# Patient Record
Sex: Female | Born: 1970
Health system: Southern US, Community
[De-identification: ages and names within clinical notes are randomized; demographics above are authoritative.]

## PROBLEM LIST (undated history)

## (undated) DIAGNOSIS — M722 Plantar fascial fibromatosis: Secondary | ICD-10-CM

## (undated) DIAGNOSIS — I499 Cardiac arrhythmia, unspecified: Secondary | ICD-10-CM

## (undated) DIAGNOSIS — F329 Major depressive disorder, single episode, unspecified: Secondary | ICD-10-CM

## (undated) DIAGNOSIS — Z9221 Personal history of antineoplastic chemotherapy: Secondary | ICD-10-CM

## (undated) DIAGNOSIS — G43909 Migraine, unspecified, not intractable, without status migrainosus: Secondary | ICD-10-CM

## (undated) DIAGNOSIS — M1990 Unspecified osteoarthritis, unspecified site: Secondary | ICD-10-CM

## (undated) DIAGNOSIS — N920 Excessive and frequent menstruation with regular cycle: Secondary | ICD-10-CM

## (undated) DIAGNOSIS — Z803 Family history of malignant neoplasm of breast: Secondary | ICD-10-CM

## (undated) DIAGNOSIS — Z1509 Genetic susceptibility to other malignant neoplasm: Principal | ICD-10-CM

## (undated) DIAGNOSIS — I10 Essential (primary) hypertension: Secondary | ICD-10-CM

## (undated) DIAGNOSIS — Z1501 Genetic susceptibility to malignant neoplasm of breast: Secondary | ICD-10-CM

## (undated) DIAGNOSIS — F32A Depression, unspecified: Secondary | ICD-10-CM

## (undated) DIAGNOSIS — K219 Gastro-esophageal reflux disease without esophagitis: Secondary | ICD-10-CM

## (undated) DIAGNOSIS — G893 Neoplasm related pain (acute) (chronic): Secondary | ICD-10-CM

## (undated) DIAGNOSIS — L640 Drug-induced androgenic alopecia: Secondary | ICD-10-CM

## (undated) DIAGNOSIS — G4700 Insomnia, unspecified: Secondary | ICD-10-CM

## (undated) DIAGNOSIS — D689 Coagulation defect, unspecified: Secondary | ICD-10-CM

## (undated) HISTORY — DX: Migraine, unspecified, not intractable, without status migrainosus: G43.909

## (undated) HISTORY — DX: Excessive and frequent menstruation with regular cycle: N92.0

## (undated) HISTORY — PX: TUBAL LIGATION: SHX77

## (undated) HISTORY — DX: Drug-induced androgenic alopecia: L64.0

## (undated) HISTORY — DX: Depression, unspecified: F32.A

## (undated) HISTORY — DX: Neoplasm related pain (acute) (chronic): G89.3

## (undated) HISTORY — DX: Unspecified osteoarthritis, unspecified site: M19.90

## (undated) HISTORY — DX: Gastro-esophageal reflux disease without esophagitis: K21.9

## (undated) HISTORY — DX: Genetic susceptibility to other malignant neoplasm: Z15.09

## (undated) HISTORY — PX: APPENDECTOMY: SHX54

## (undated) HISTORY — DX: Genetic susceptibility to malignant neoplasm of breast: Z15.01

## (undated) HISTORY — DX: Family history of malignant neoplasm of breast: Z80.3

## (undated) HISTORY — DX: Coagulation defect, unspecified: D68.9

## (undated) HISTORY — DX: Plantar fascial fibromatosis: M72.2

## (undated) HISTORY — DX: Insomnia, unspecified: G47.00

## (undated) HISTORY — PX: PARACENTESIS: SHX844

## (undated) HISTORY — DX: Major depressive disorder, single episode, unspecified: F32.9

---

## 1999-01-26 ENCOUNTER — Inpatient Hospital Stay (HOSPITAL_COMMUNITY): Admission: AD | Admit: 1999-01-26 | Discharge: 1999-01-26 | Payer: Self-pay

## 1999-04-08 ENCOUNTER — Ambulatory Visit (HOSPITAL_COMMUNITY): Admission: RE | Admit: 1999-04-08 | Discharge: 1999-04-08 | Payer: Self-pay | Admitting: *Deleted

## 1999-06-04 ENCOUNTER — Inpatient Hospital Stay (HOSPITAL_COMMUNITY): Admission: AD | Admit: 1999-06-04 | Discharge: 1999-06-04 | Payer: Self-pay | Admitting: *Deleted

## 1999-06-21 ENCOUNTER — Ambulatory Visit (HOSPITAL_COMMUNITY): Admission: RE | Admit: 1999-06-21 | Discharge: 1999-06-21 | Payer: Self-pay | Admitting: Obstetrics

## 1999-07-09 ENCOUNTER — Inpatient Hospital Stay (HOSPITAL_COMMUNITY): Admission: AD | Admit: 1999-07-09 | Discharge: 1999-07-09 | Payer: Self-pay | Admitting: Obstetrics & Gynecology

## 1999-08-27 ENCOUNTER — Inpatient Hospital Stay (HOSPITAL_COMMUNITY): Admission: AD | Admit: 1999-08-27 | Discharge: 1999-08-27 | Payer: Self-pay | Admitting: *Deleted

## 1999-09-03 ENCOUNTER — Inpatient Hospital Stay (HOSPITAL_COMMUNITY): Admission: AD | Admit: 1999-09-03 | Discharge: 1999-09-07 | Payer: Self-pay | Admitting: Obstetrics

## 2000-09-01 ENCOUNTER — Encounter: Admission: RE | Admit: 2000-09-01 | Discharge: 2000-09-01 | Payer: Self-pay | Admitting: Internal Medicine

## 2001-04-28 ENCOUNTER — Emergency Department (HOSPITAL_COMMUNITY): Admission: EM | Admit: 2001-04-28 | Discharge: 2001-04-28 | Payer: Self-pay | Admitting: Unknown Physician Specialty

## 2002-03-12 ENCOUNTER — Encounter: Payer: Self-pay | Admitting: Family Medicine

## 2002-03-12 ENCOUNTER — Encounter: Admission: RE | Admit: 2002-03-12 | Discharge: 2002-03-12 | Payer: Self-pay | Admitting: Family Medicine

## 2005-02-28 ENCOUNTER — Other Ambulatory Visit: Admission: RE | Admit: 2005-02-28 | Discharge: 2005-02-28 | Payer: Self-pay | Admitting: Obstetrics and Gynecology

## 2005-12-12 ENCOUNTER — Inpatient Hospital Stay (HOSPITAL_COMMUNITY): Admission: EM | Admit: 2005-12-12 | Discharge: 2005-12-15 | Payer: Self-pay | Admitting: Emergency Medicine

## 2005-12-12 ENCOUNTER — Encounter (INDEPENDENT_AMBULATORY_CARE_PROVIDER_SITE_OTHER): Payer: Self-pay | Admitting: *Deleted

## 2006-05-01 ENCOUNTER — Emergency Department (HOSPITAL_COMMUNITY): Admission: EM | Admit: 2006-05-01 | Discharge: 2006-05-02 | Payer: Self-pay | Admitting: Emergency Medicine

## 2008-02-07 ENCOUNTER — Other Ambulatory Visit: Admission: RE | Admit: 2008-02-07 | Discharge: 2008-02-07 | Payer: Self-pay | Admitting: Family Medicine

## 2008-11-03 ENCOUNTER — Emergency Department (HOSPITAL_COMMUNITY): Admission: EM | Admit: 2008-11-03 | Discharge: 2008-11-03 | Payer: Self-pay | Admitting: Emergency Medicine

## 2008-12-11 ENCOUNTER — Emergency Department (HOSPITAL_COMMUNITY): Admission: EM | Admit: 2008-12-11 | Discharge: 2008-12-11 | Payer: Self-pay | Admitting: Emergency Medicine

## 2008-12-11 ENCOUNTER — Encounter (INDEPENDENT_AMBULATORY_CARE_PROVIDER_SITE_OTHER): Payer: Self-pay | Admitting: *Deleted

## 2009-10-05 ENCOUNTER — Other Ambulatory Visit: Admission: RE | Admit: 2009-10-05 | Discharge: 2009-10-05 | Payer: Self-pay | Admitting: Family Medicine

## 2010-03-26 ENCOUNTER — Inpatient Hospital Stay (HOSPITAL_COMMUNITY): Admission: AD | Admit: 2010-03-26 | Discharge: 2010-03-27 | Payer: Self-pay | Admitting: Obstetrics & Gynecology

## 2010-08-04 ENCOUNTER — Emergency Department (HOSPITAL_COMMUNITY): Admission: EM | Admit: 2010-08-04 | Discharge: 2010-08-04 | Payer: Self-pay | Admitting: Family Medicine

## 2011-03-11 LAB — POCT URINALYSIS DIPSTICK
Bilirubin Urine: NEGATIVE
Glucose, UA: NEGATIVE mg/dL
Ketones, ur: NEGATIVE mg/dL
Protein, ur: NEGATIVE mg/dL
Specific Gravity, Urine: >=1.03 (ref 1.005–1.030)
Urobilinogen, UA: 0.2 mg/dL (ref 0.0–1.0)

## 2011-03-11 LAB — WET PREP, GENITAL: Yeast Wet Prep HPF POC: NONE SEEN

## 2011-03-11 LAB — POCT PREGNANCY, URINE: Preg Test, Ur: NEGATIVE

## 2011-03-11 LAB — URINE CULTURE

## 2011-03-11 LAB — GC/CHLAMYDIA PROBE AMP, GENITAL: GC Probe Amp, Genital: NEGATIVE

## 2011-03-16 LAB — WET PREP, GENITAL
Trich, Wet Prep: NONE SEEN
Yeast Wet Prep HPF POC: NONE SEEN

## 2011-03-16 LAB — URINALYSIS, ROUTINE W REFLEX MICROSCOPIC
Protein, ur: NEGATIVE mg/dL
Urobilinogen, UA: 0.2 mg/dL (ref 0.0–1.0)
pH: 6.5 (ref 5.0–8.0)

## 2011-03-16 LAB — GC/CHLAMYDIA PROBE AMP, GENITAL
Chlamydia, DNA Probe: NEGATIVE
GC Probe Amp, Genital: NEGATIVE

## 2011-03-16 LAB — POCT PREGNANCY, URINE: Preg Test, Ur: NEGATIVE

## 2011-03-16 LAB — URINE MICROSCOPIC-ADD ON

## 2011-05-13 NOTE — Op Note (Signed)
[name], [name]              ACCOUNT NO.:  [id number]   MEDICAL RECORD NO.:  [id number]          PATIENT TYPE:  INP   LOCATION:  5733                         FACILITY:  [facility]   PHYSICIAN:  Cherylynn Ridges, M.D.    DATE OF BIRTH:  [birth year]   DATE OF PROCEDURE:  12/13/2005  DATE OF DISCHARGE:                                 OPERATIVE REPORT   PREOPERATIVE DIAGNOSIS:  Acute appendicitis.   POSTOPERATIVE DIAGNOSIS:  Acute appendicitis with intraoperative rupture.   PROCEDURE:  Laparoscopic appendectomy.   SURGEON:  Cherylynn Ridges, M.D.   ASSISTANT:  None.   ANESTHESIA:  General endotracheal.   ESTIMATED BLOOD LOSS:  Less than 20 mL.   COMPLICATIONS:  Intraoperative rupture with localized spillage of pus in the  right lower quadrant.   CONDITION:  Stable.   INDICATIONS FOR OPERATION:  The patient is a 40 year old with abdominal  pain, localized was right lobe lower quadrant with white count of 10,000 and  left showed and a CT demonstrating acute appendicitis.   FINDINGS:  The patient had a suppurative appendix which had not ruptured  until it was manipulated during surgery at which time there was localize  rupture into the right lower quadrant. This was copiously irrigated free  using 3 liters of saline solution.   OPERATION:  The patient was taken to the operating room, placed on the table  in supine position. After an adequate endotracheal anesthetic was  administered, she was prepped and draped in usual sterile manner exposing  the midline and the right upper quadrant and left lower quadrant. A  supraumbilical curvilinear incision was made using #11 blade and taken out  to the midline fascia. It was through this midline fascia that a Veress  needle was passed into the peritoneal cavity and confirmed to be in this  position with the saline test. Once this was done, carbon dioxide  insufflation was instilled into the peritoneal cavity up to maximal intra-  abdominal  pressure of 50 mmHg. Once this was done, a 12 mm OptiVu trocar and  cannula was inserted.  Camera and light source was passed through the  supraumbilical fascia into the peritoneal cavity under indirect vision. Once  we had adequate intraperitoneal placement of the trocar and cannula, we  placed a right upper quadrant 5 mm cannula and a  suprapubic 12 mm cavity  under direct vision. We all in place, dissection was begun with the patient  in Trendelenburg and left-side was tilted down.   The appendix was partially covered at its tip with a piece of omentum which  was apparently covering a very necrotic area of appendix. As we manipulated  this bluntly in order to get adequate exposure of the appendiceal base of  the cecum, the appendix ruptured, spilling several mLs of pus in the right  lower quadrant. We continued to mobilize the mesoappendix using  electrocautery to come through it very carefully down to the base of the  appendix at the cecum. We came across the base of the appendix at the cecum  using an Endo-GIA with 3.5 mm  closure staples. This allowed [place] to remove the  appendix using an EndoCatch bag through the suprapubic cannula with minimal  difficulty. Once this was done, we inserted all cannulas and started to  irrigate the right lower quadrant and periappendiceal and pericolic area  with copious amounts of saline solution. Approximately 3 liters were used.  Once we had adequately irrigated that area and inspected for bleeding and  there was minimal to no bleeding, we removed all cannulas and aspirated all  fluid and gas.   The supraumbilical site was closed using a fascial stitch of 0 Vicryl. The  skin was closed using running subcuticular stitch of 5-0 Vicryl, 0.25  Marcaine was previously injected at all sites. All counts were correct and  sterile dressings were applied.      Cherylynn Ridges, M.D.  Electronically Signed     JOW/MEDQ  D:  12/13/2005  T:  12/14/2005   Job:  [id number]

## 2011-05-13 NOTE — H&P (Signed)
[name], KLUTH NO.:  [id number]   MEDICAL RECORD NO.:  [id number]          PATIENT TYPE:  EMS   LOCATION:  MAJO                         FACILITY:  [facility]   PHYSICIAN:  Cherylynn Ridges, M.D.    DATE OF BIRTH:  [birth year]   DATE OF ADMISSION:  12/12/2005  DATE OF DISCHARGE:                                HISTORY & PHYSICAL   IDENTIFICATION AND CHIEF COMPLAINT:  The patient is a 40 year old with acute  appendicitis diagnosed by CT scan.   HISTORY OF PRESENT ILLNESS:  The patient has been ill since yesterday  evening with vague lower abdominal pain.  She says that the pain started on  the right side and has been on the right side.  She was seen in the  emergency room this evening starting about 6 o'clock p.m. with abdominal  pain.  She was seen by the P.A. who, in spite of a UA demonstrating possible  acute urinary tract infection, felt as though her pain was out of proportion  to what one would expect with a UTI and ordered a CT scan which demonstrated  acute appendicitis.  Surgical consultation was obtained.   PAST MEDICAL HISTORY:  Unremarkable.   REVIEW OF SYSTEMS:  She has not had any nausea or vomiting. She has no  decreased appetite and ate shortly before coming in here.  She has had no  fevers or chills.  No rebound or guarding.   MEDICATIONS:  None.  She does not take anything but birth control pills.   ALLERGIES:  No known drug allergies.   PHYSICAL EXAMINATION:  GENERAL: Well-nourished, well-developed, in no acute  distress as of now. Mild tenderness in the right lower quadrant.  HEENT:  Normocephalic and atraumatic.  Not icteric.  NECK:  Supple.  CHEST:  Clear to auscultation.  HEART:  Regular rhythm and rate.  ABDOMEN: Soft, tender in the right lower quadrant with rebound, guarding,  and positive Rovsing sign.  RECTAL AND PELVIC:  Exams not performed.   IMPRESSION:  Acute appendicitis by CT, but I check the patient's UA which  does show  some evidence of bacteria but also contaminated specimen. White  count is normal, but she does have a left shift at 10.1.   PLAN:  I plan to take her to the OR for a laparoscopic appendectomy.  This  will be done as soon as possible.     Cherylynn Ridges, M.D.  Electronically Signed    JOW/MEDQ  D:  12/12/2005  T:  12/13/2005  Job:  [id number]

## 2011-05-13 NOTE — Discharge Summary (Signed)
NAMEMERISSA, RENWICK              ACCOUNT NO.:  [id number]   MEDICAL RECORD NO.:  [id number]          PATIENT TYPE:  INP   LOCATION:  5733                         FACILITY:  [facility]   PHYSICIAN:  Sharlet Salina T. Hoxworth, M.D.DATE OF BIRTH:  [birth year]   DATE OF ADMISSION:  12/12/2005  DATE OF DISCHARGE:  12/15/2005                                 DISCHARGE SUMMARY   DISCHARGE DIAGNOSIS:  Acute appendicitis with intraoperative rupture, status  post laparoscopic appendectomy December 13, 2005 by Dr. Lindie Spruce.   HISTORY OF PRESENT ILLNESS:  Ms. Hlavaty is a 40 year old female who had a  history of 24 hours of abdominal pain at the right lower quadrant. She  presented to [facility] emergency room and CT scan confirmed an acute  appendicitis. She was taken emergently to the operating room by Dr. Jimmye Norman and she was found to have acute appendicitis as well as an  intraoperative rupture. She tolerated the procedure well and was taken back  to her room in stable condition. During the next day, she had varying blood  pressures but as she gradually began to eat and ambulate, her blood pressure  normalized. On postoperative day 2, her systolic blood pressure standing was  112/72. Her white blood cell count had normalized to 7.3.   DISPOSITION:  She was ready for discharge to home.   CONDITION ON DISCHARGE:  Stable.   DISCHARGE MEDICATIONS:  1.  Ciprofloxacin 500 mg 1 p.o. b.i.d.  2.  Flagyl 500 mg p.o. t.i.d.  Both of these will remain for 1 week.  1.  Vicodin as needed for pain.   WOUND CARE:  She is to clean the area with soap and water.   ACTIVITY:  No driving for 2 days. No lifting over 10 pounds for 2 weeks.   SPECIAL INSTRUCTIONS:  She may return to work on December 25, 2005.   FOLLOW UP:  Appointment with Dr. Lindie Spruce on December 20, 2005 at 11:30 a.m.      Guy Franco, P.A.      Lorne Skeens. Hoxworth, M.D.  Electronically Signed    LB/MEDQ  D:  12/15/2005  T:  12/16/2005  Job:   [id number]   cc:   [facility]

## 2011-09-27 LAB — DIFFERENTIAL
Basophils Absolute: 0
Basophils Relative: 0
Eosinophils Absolute: 0.1
Lymphocytes Relative: 26
Monocytes Absolute: 0.5
Monocytes Relative: 6
Neutrophils Relative %: 67

## 2011-09-27 LAB — BASIC METABOLIC PANEL
BUN: 15
CO2: 23
Calcium: 9.1
Chloride: 108
GFR calc Af Amer: >60
Glucose, Bld: 104 — ABNORMAL HIGH
Potassium: 4
Sodium: 139

## 2011-09-27 LAB — CBC
HCT: 41.8
Hemoglobin: 14.2
MCHC: 34
RDW: 13

## 2011-09-27 LAB — CK TOTAL AND CKMB (NOT AT ARMC): CK, MB: 1.7

## 2011-09-30 LAB — URINALYSIS, ROUTINE W REFLEX MICROSCOPIC
Glucose, UA: NEGATIVE mg/dL
Leukocytes, UA: NEGATIVE
pH: 5.5 (ref 5.0–8.0)

## 2011-09-30 LAB — CBC
Hemoglobin: 13.8 g/dL (ref 12.0–15.0)
MCHC: 33.6 g/dL (ref 30.0–36.0)
Platelets: 170 10*3/uL (ref 150–400)
RBC: 4.44 MIL/uL (ref 3.87–5.11)
RDW: 13.3 % (ref 11.5–15.5)

## 2011-09-30 LAB — DIFFERENTIAL
Basophils Absolute: 0.1 10*3/uL (ref 0.0–0.1)
Eosinophils Relative: 1 % (ref 0–5)
Lymphocytes Relative: 24 % (ref 12–46)
Lymphs Abs: 1.9 10*3/uL (ref 0.7–4.0)
Neutro Abs: 5.5 10*3/uL (ref 1.7–7.7)
Neutrophils Relative %: 68 % (ref 43–77)

## 2011-09-30 LAB — PREGNANCY, URINE: Preg Test, Ur: NEGATIVE

## 2011-09-30 LAB — COMPREHENSIVE METABOLIC PANEL
AST: 22 U/L (ref 0–37)
Calcium: 9.2 mg/dL (ref 8.4–10.5)
Creatinine, Ser: 0.88 mg/dL (ref 0.4–1.2)
GFR calc non Af Amer: >60 mL/min (ref >60)
Total Protein: 7 g/dL (ref 6.0–8.3)

## 2011-09-30 LAB — URINE MICROSCOPIC-ADD ON

## 2011-12-14 ENCOUNTER — Other Ambulatory Visit: Payer: Self-pay | Admitting: Family Medicine

## 2011-12-14 ENCOUNTER — Other Ambulatory Visit (HOSPITAL_COMMUNITY)
Admission: RE | Admit: 2011-12-14 | Discharge: 2011-12-14 | Disposition: A | Discharge status: Home / Self Care | Payer: 59 | Source: Ambulatory Visit | Attending: Family Medicine | Admitting: Family Medicine

## 2011-12-14 DIAGNOSIS — Z1159 Encounter for screening for other viral diseases: Secondary | ICD-10-CM | POA: Insufficient documentation

## 2011-12-14 DIAGNOSIS — Z01419 Encounter for gynecological examination (general) (routine) without abnormal findings: Secondary | ICD-10-CM | POA: Insufficient documentation

## 2012-05-22 ENCOUNTER — Emergency Department (HOSPITAL_COMMUNITY)
Admission: EM | Admit: 2012-05-22 | Discharge: 2012-05-23 | Disposition: A | Discharge status: Home / Self Care | Payer: No Typology Code available for payment source | Attending: Emergency Medicine | Admitting: Emergency Medicine

## 2012-05-22 ENCOUNTER — Encounter (HOSPITAL_COMMUNITY): Payer: Self-pay

## 2012-05-22 DIAGNOSIS — R079 Chest pain, unspecified: Secondary | ICD-10-CM | POA: Insufficient documentation

## 2012-05-22 DIAGNOSIS — Y92410 Unspecified street and highway as the place of occurrence of the external cause: Secondary | ICD-10-CM | POA: Insufficient documentation

## 2012-05-22 DIAGNOSIS — M549 Dorsalgia, unspecified: Secondary | ICD-10-CM | POA: Insufficient documentation
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
## 2012-05-23 ENCOUNTER — Emergency Department (HOSPITAL_COMMUNITY): Payer: No Typology Code available for payment source

## 2012-05-23 MED ORDER — IBUPROFEN 800 MG PO TABS
800.0000 mg | ORAL_TABLET | Freq: Once | ORAL | Status: AC
  Administered 2012-05-23: 800 mg via ORAL
  Filled 2012-05-23: qty 1

## 2012-05-23 MED ORDER — IBUPROFEN 800 MG PO TABS: 800.0000 mg | ORAL_TABLET | Freq: Three times a day (TID) | ORAL | Status: AC

## 2012-05-23 MED ORDER — DIAZEPAM 5 MG PO TABS: 5.0000 mg | ORAL_TABLET | Freq: Two times a day (BID) | ORAL | Status: AC
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
## 2012-05-23 NOTE — ED Provider Notes (Signed)
Medical screening examination/treatment/procedure(s) were performed by non-physician practitioner and as supervising physician I was immediately available for consultation/collaboration.  Sunnie Nielsen, MD 05/23/12 2306
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
## 2013-01-22 LAB — HM PAP SMEAR

## 2013-01-24 LAB — HM MAMMOGRAPHY: HM MAMMO: NORMAL

## 2015-02-08 ENCOUNTER — Encounter (HOSPITAL_COMMUNITY): Payer: Self-pay | Admitting: Emergency Medicine

## 2015-02-08 ENCOUNTER — Emergency Department (HOSPITAL_COMMUNITY)
Admission: EM | Admit: 2015-02-08 | Discharge: 2015-02-08 | Disposition: A | Discharge status: Home / Self Care | Payer: 59 | Attending: Emergency Medicine | Admitting: Emergency Medicine

## 2015-02-08 DIAGNOSIS — S0990XA Unspecified injury of head, initial encounter: Secondary | ICD-10-CM | POA: Diagnosis not present

## 2015-02-08 DIAGNOSIS — Y9389 Activity, other specified: Secondary | ICD-10-CM | POA: Diagnosis not present

## 2015-02-08 DIAGNOSIS — Y998 Other external cause status: Secondary | ICD-10-CM | POA: Diagnosis not present

## 2015-02-08 DIAGNOSIS — Z792 Long term (current) use of antibiotics: Secondary | ICD-10-CM | POA: Diagnosis not present

## 2015-02-08 DIAGNOSIS — Z720 Tobacco use: Secondary | ICD-10-CM | POA: Diagnosis not present

## 2015-02-08 DIAGNOSIS — Z79899 Other long term (current) drug therapy: Secondary | ICD-10-CM | POA: Diagnosis not present

## 2015-02-08 DIAGNOSIS — T148XXA Other injury of unspecified body region, initial encounter: Secondary | ICD-10-CM

## 2015-02-08 DIAGNOSIS — Y92410 Unspecified street and highway as the place of occurrence of the external cause: Secondary | ICD-10-CM | POA: Diagnosis not present

## 2015-02-08 MED ORDER — IBUPROFEN 800 MG PO TABS
800.0000 mg | ORAL_TABLET | Freq: Once | ORAL | Status: AC
  Administered 2015-02-08: 800 mg via ORAL
  Filled 2015-02-08: qty 1

## 2015-02-08 MED ORDER — CYCLOBENZAPRINE HCL 10 MG PO TABS
10.0000 mg | ORAL_TABLET | Freq: Once | ORAL | Status: AC
  Administered 2015-02-08: 10 mg via ORAL
  Filled 2015-02-08: qty 1

## 2015-02-08 MED ORDER — CYCLOBENZAPRINE HCL 10 MG PO TABS: 10.0000 mg | ORAL_TABLET | Freq: Two times a day (BID) | ORAL | Status: DC | PRN

## 2015-02-08 MED ORDER — IBUPROFEN 800 MG PO TABS: 800.0000 mg | ORAL_TABLET | Freq: Three times a day (TID) | ORAL | Status: DC
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
## 2015-05-17 ENCOUNTER — Emergency Department (HOSPITAL_COMMUNITY)
Admission: EM | Admit: 2015-05-17 | Discharge: 2015-05-17 | Disposition: A | Discharge status: Home / Self Care | Payer: 59 | Attending: Emergency Medicine | Admitting: Emergency Medicine

## 2015-05-17 ENCOUNTER — Encounter (HOSPITAL_COMMUNITY): Payer: Self-pay | Admitting: Emergency Medicine

## 2015-05-17 ENCOUNTER — Emergency Department (HOSPITAL_COMMUNITY): Payer: 59

## 2015-05-17 DIAGNOSIS — Z79899 Other long term (current) drug therapy: Secondary | ICD-10-CM | POA: Insufficient documentation

## 2015-05-17 DIAGNOSIS — W108XXA Fall (on) (from) other stairs and steps, initial encounter: Secondary | ICD-10-CM | POA: Insufficient documentation

## 2015-05-17 DIAGNOSIS — S96912A Strain of unspecified muscle and tendon at ankle and foot level, left foot, initial encounter: Secondary | ICD-10-CM | POA: Diagnosis not present

## 2015-05-17 DIAGNOSIS — Y9389 Activity, other specified: Secondary | ICD-10-CM | POA: Diagnosis not present

## 2015-05-17 DIAGNOSIS — Y998 Other external cause status: Secondary | ICD-10-CM | POA: Diagnosis not present

## 2015-05-17 DIAGNOSIS — Z791 Long term (current) use of non-steroidal anti-inflammatories (NSAID): Secondary | ICD-10-CM | POA: Diagnosis not present

## 2015-05-17 DIAGNOSIS — Z792 Long term (current) use of antibiotics: Secondary | ICD-10-CM | POA: Insufficient documentation

## 2015-05-17 DIAGNOSIS — Y9289 Other specified places as the place of occurrence of the external cause: Secondary | ICD-10-CM | POA: Diagnosis not present

## 2015-05-17 DIAGNOSIS — Z720 Tobacco use: Secondary | ICD-10-CM | POA: Insufficient documentation

## 2015-05-17 DIAGNOSIS — S99922A Unspecified injury of left foot, initial encounter: Secondary | ICD-10-CM | POA: Diagnosis present

## 2015-05-17 NOTE — Discharge Instructions (Signed)
Cryotherapy °Cryotherapy means treatment with cold. Ice or gel packs can be used to reduce both pain and swelling. Ice is the most helpful within the first 24 to 48 hours after an injury or flare-up from overusing a muscle or joint. Sprains, strains, spasms, burning pain, shooting pain, and aches can all be eased with ice. Ice can also be used when recovering from surgery. Ice is effective, has very few side effects, and is safe for most people to use. °PRECAUTIONS  °Ice is not a safe treatment option for people with: °· Raynaud phenomenon. This is a condition affecting small blood vessels in the extremities. Exposure to cold may cause your problems to return. °· Cold hypersensitivity. There are many forms of cold hypersensitivity, including: °¨ Cold urticaria. Red, itchy hives appear on the skin when the tissues begin to warm after being iced. °¨ Cold erythema. This is a red, itchy rash caused by exposure to cold. °¨ Cold hemoglobinuria. Red blood cells break down when the tissues begin to warm after being iced. The hemoglobin that carry oxygen are passed into the urine because they cannot combine with blood proteins fast enough. °· Numbness or altered sensitivity in the area being iced. °If you have any of the following conditions, do not use ice until you have discussed cryotherapy with your caregiver: °· Heart conditions, such as arrhythmia, angina, or chronic heart disease. °· High blood pressure. °· Healing wounds or open skin in the area being iced. °· Current infections. °· Rheumatoid arthritis. °· Poor circulation. °· Diabetes. °Ice slows the blood flow in the region it is applied. This is beneficial when trying to stop inflamed tissues from spreading irritating chemicals to surrounding tissues. However, if you expose your skin to cold temperatures for too long or without the proper protection, you can damage your skin or nerves. Watch for signs of skin damage due to cold. °HOME CARE INSTRUCTIONS °Follow  these tips to use ice and cold packs safely. °· Place a dry or damp towel between the ice and skin. A damp towel will cool the skin more quickly, so you may need to shorten the time that the ice is used. °· For a more rapid response, add gentle compression to the ice. °· Ice for no more than 10 to 20 minutes at a time. The bonier the area you are icing, the less time it will take to get the benefits of ice. °· Check your skin after 5 minutes to make sure there are no signs of a poor response to cold or skin damage. °· Rest 20 minutes or more between uses. °· Once your skin is numb, you can end your treatment. You can test numbness by very lightly touching your skin. The touch should be so light that you do not see the skin dimple from the pressure of your fingertip. When using ice, most people will feel these normal sensations in this order: cold, burning, aching, and numbness. °· Do not use ice on someone who cannot communicate their responses to pain, such as small children or people with dementia. °HOW TO MAKE AN ICE PACK °Ice packs are the most common way to use ice therapy. Other methods include ice massage, ice baths, and cryosprays. Muscle creams that cause a cold, tingly feeling do not offer the same benefits that ice offers and should not be used as a substitute unless recommended by your caregiver. °To make an ice pack, do one of the following: °· Place crushed ice or a   bag of frozen vegetables in a sealable plastic bag. Squeeze out the excess air. Place this bag inside another plastic bag. Slide the bag into a pillowcase or place a damp towel between your skin and the bag.  Mix 3 parts water with 1 part rubbing alcohol. Freeze the mixture in a sealable plastic bag. When you remove the mixture from the freezer, it will be slushy. Squeeze out the excess air. Place this bag inside another plastic bag. Slide the bag into a pillowcase or place a damp towel between your skin and the bag. SEEK MEDICAL CARE  IF:  You develop white spots on your skin. This may give the skin a blotchy (mottled) appearance.  Your skin turns blue or pale.  Your skin becomes waxy or hard.  Your swelling gets worse. MAKE SURE YOU:   Understand these instructions.  Will watch your condition.  Will get help right away if you are not doing well or get worse. Document Released: 08/08/2011 Document Revised: 04/28/2014 Document Reviewed: 08/08/2011 [facility] Patient Information 2015 [place], [place]. This information is not intended to replace advice given to you by your health care provider. Make sure you discuss any questions you have with your health care provider. Foot Sprain The muscles and cord like structures which attach muscle to bone (tendons) that surround the feet are made up of units. A foot sprain can occur at the weakest spot in any of these units. This condition is most often caused by injury to or overuse of the foot, as from playing contact sports, or aggravating a previous injury, or from poor conditioning, or obesity. SYMPTOMS  Pain with movement of the foot.  Tenderness and swelling at the injury site.  Loss of strength is present in moderate or severe sprains. THE THREE GRADES OR SEVERITY OF FOOT SPRAIN ARE:  Mild (Grade I): Slightly pulled muscle without tearing of muscle or tendon fibers or loss of strength.  Moderate (Grade II): Tearing of fibers in a muscle, tendon, or at the attachment to bone, with small decrease in strength.  Severe (Grade III): Rupture of the muscle-tendon-bone attachment, with separation of fibers. Severe sprain requires surgical repair. Often repeating (chronic) sprains are caused by overuse. Sudden (acute) sprains are caused by direct injury or over-use. DIAGNOSIS  Diagnosis of this condition is usually by your own observation. If problems continue, a caregiver may be required for further evaluation and treatment. X-rays may be required to make sure there are not  breaks in the bones (fractures) present. Continued problems may require physical therapy for treatment. PREVENTION  Use strength and conditioning exercises appropriate for your sport.  Warm up properly prior to working out.  Use athletic shoes that are made for the sport you are participating in.  Allow adequate time for healing. Early return to activities makes repeat injury more likely, and can lead to an unstable arthritic foot that can result in prolonged disability. Mild sprains generally heal in 3 to 10 days, with moderate and severe sprains taking 2 to 10 weeks. Your caregiver can help you determine the proper time required for healing. HOME CARE INSTRUCTIONS   Apply ice to the injury for 15-20 minutes, 03-04 times per day. Put the ice in a plastic bag and place a towel between the bag of ice and your skin.  An elastic wrap (like an Ace bandage) may be used to keep swelling down.  Keep foot above the level of the heart, or at least raised on a footstool, when  swelling and pain are present.  Try to avoid use other than gentle range of motion while the foot is painful. Do not resume use until instructed by your caregiver. Then begin use gradually, not increasing use to the point of pain. If pain does develop, decrease use and continue the above measures, gradually increasing activities that do not cause discomfort, until you gradually achieve normal use.  Use crutches if and as instructed, and for the length of time instructed.  Keep injured foot and ankle wrapped between treatments.  Massage foot and ankle for comfort and to keep swelling down. Massage from the toes up towards the knee.  Only take over-the-counter or prescription medicines for pain, discomfort, or fever as directed by your caregiver. SEEK IMMEDIATE MEDICAL CARE IF:   Your pain and swelling increase, or pain is not controlled with medications.  You have loss of feeling in your foot or your foot turns cold or  blue.  You develop new, unexplained symptoms, or an increase of the symptoms that brought you to your caregiver. MAKE SURE YOU:   Understand these instructions.  Will watch your condition.  Will get help right away if you are not doing well or get worse. Document Released: 06/03/2002 Document Revised: 03/05/2012 Document Reviewed: 07/31/2008 [facility] Patient Information 2015 [place], [place]. This information is not intended to replace advice given to you by your health care provider. Make sure you discuss any questions you have with your health care provider.

## 2015-05-17 NOTE — ED Provider Notes (Signed)
CSN: [id number]     Arrival date & time 05/17/15  2047 History  This chart was scribed for Charlann Lange, PA-C, working with Charlesetta Shanks, MD by Marti Sleigh, ED Scribe. This patient was seen in room WTR7/WTR7 and the patient's care was started at 10:02 PM.     Chief Complaint  Patient presents with  . Foot Injury   Patient is a 44 y.o. female presenting with foot injury. The history is provided by the patient. No language interpreter was used.  Foot Injury Associated symptoms: no fever     HPI Comments: [name] is a 44 y.o. female who presents to the Emergency Department complaining of a left foot pain after a fall earlier today. Pt states her pain is worse on the lateral foot. Pt states she was walking down some stairs, which were wet, and she slipped and "did a spit" twisting it the foot as she fell. Pt states she has taken ibuprofen PTA.   History reviewed. No pertinent past medical history. Past Surgical History  Procedure Laterality Date  . Cesarean section     No family history on file. History  Substance Use Topics  . Smoking status: Current Every Day Smoker -- 0.25 packs/day  . Smokeless tobacco: Never Used  . Alcohol Use: No   OB History    No data available     Review of Systems  Constitutional: Negative for fever and chills.  Musculoskeletal: Positive for joint swelling and gait problem.    Allergies  Review of patient's allergies indicates no known allergies.  Home Medications   Prior to Admission medications   Medication Sig Start Date End Date Taking? Authorizing Provider  BIOTIN PO Take 1 tablet by mouth daily.    Historical Provider, MD  calcium carbonate (OS-CAL) 600 MG TABS Take 600 mg by mouth daily.    Historical Provider, MD  cyclobenzaprine (FLEXERIL) 10 MG tablet Take 1 tablet (10 mg total) by mouth 2 (two) times daily as needed for muscle spasms. 02/08/15   Charlann Lange, PA-C  fish oil-omega-3 fatty acids 1000 MG capsule Take 1 g by  mouth daily.    Historical Provider, MD  ibuprofen (ADVIL,MOTRIN) 800 MG tablet Take 1 tablet (800 mg total) by mouth 3 (three) times daily. 02/08/15   Charlann Lange, PA-C  Multiple Vitamin (MULITIVITAMIN WITH MINERALS) TABS Take 1 tablet by mouth daily.    Historical Provider, MD  sulfamethoxazole-trimethoprim (BACTRIM DS) 800-160 MG per tablet Take 1 tablet by mouth 2 (two) times daily.    Historical Provider, MD   BP 128/73 mmHg  Pulse 89  Temp(Src) 98.6 F (37 C) (Oral)  Resp 20  SpO2 99% Physical Exam  Constitutional: She is oriented to person, place, and time. She appears well-developed and well-nourished. No distress.  HENT:  Head: Normocephalic and atraumatic.  Eyes: Pupils are equal, round, and reactive to light.  Neck: Neck supple.  Cardiovascular: Normal rate.   Pulmonary/Chest: Effort normal. No respiratory distress.  Musculoskeletal: Normal range of motion.  Left foot ecchymosis of the second toe and dorsal distal forefoot. No deformity, NV intact.   Neurological: She is alert and oriented to person, place, and time. Coordination normal.  Skin: Skin is warm and dry. She is not diaphoretic.  Psychiatric: She has a normal mood and affect. Her behavior is normal.  Nursing note and vitals reviewed.   ED Course  Procedures  DIAGNOSTIC STUDIES: Oxygen Saturation is 99% on RA, normal by my interpretation.  COORDINATION OF CARE: 10:06 PM Discussed treatment plan with pt at bedside and pt agreed to plan.  Labs Review Labs Reviewed - No data to display  Imaging Review Dg Foot Complete Left  05/17/2015   CLINICAL DATA:  Post fall going down stairs now with bruising just proximal to the third and fourth MTP joints.  EXAM: LEFT FOOT - COMPLETE 3+ VIEW  COMPARISON:  None.  FINDINGS: No fracture or dislocation with special attention paid to the third and fourth MTP joints. Joint spaces are preserved. No erosions. Regional soft tissues appear normal. No radiopaque foreign body.  Small plantar calcaneal spur.  IMPRESSION: 1. No acute findings with special attention paid to the third and fourth MTP joints. 2. Small plantar calcaneal spur.   Electronically Signed   By: Sandi Mariscal M.D.   On: 05/17/2015 21:37     EKG Interpretation None      MDM   Final diagnoses:  None    1. Foot contusion  No fractures on imaging. Exam is benign except for bruising to toes suggesting strain injury.   I personally performed the services described in this documentation, which was scribed in my presence. The recorded information has been reviewed and is accurate.     Charlann Lange, PA-C 05/18/15 [id number]  Charlesetta Shanks, MD 05/19/15 [phone]

## 2015-05-17 NOTE — ED Notes (Signed)
Pt states that she fell down the stairs today at her daughter's house and now has L foot pain. Foot is swollen and bruised. Alert and oriented.

## 2015-09-18 ENCOUNTER — Emergency Department (HOSPITAL_COMMUNITY)
Admission: EM | Admit: 2015-09-18 | Discharge: 2015-09-18 | Disposition: A | Discharge status: Home / Self Care | Payer: 59 | Attending: Emergency Medicine | Admitting: Emergency Medicine

## 2015-09-18 ENCOUNTER — Encounter (HOSPITAL_COMMUNITY): Payer: Self-pay | Admitting: Emergency Medicine

## 2015-09-18 DIAGNOSIS — Z720 Tobacco use: Secondary | ICD-10-CM | POA: Diagnosis not present

## 2015-09-18 DIAGNOSIS — Y998 Other external cause status: Secondary | ICD-10-CM | POA: Insufficient documentation

## 2015-09-18 DIAGNOSIS — Y9389 Activity, other specified: Secondary | ICD-10-CM | POA: Diagnosis not present

## 2015-09-18 DIAGNOSIS — M546 Pain in thoracic spine: Secondary | ICD-10-CM | POA: Diagnosis present

## 2015-09-18 DIAGNOSIS — S29012A Strain of muscle and tendon of back wall of thorax, initial encounter: Secondary | ICD-10-CM | POA: Diagnosis not present

## 2015-09-18 DIAGNOSIS — X58XXXA Exposure to other specified factors, initial encounter: Secondary | ICD-10-CM | POA: Diagnosis not present

## 2015-09-18 DIAGNOSIS — Z79899 Other long term (current) drug therapy: Secondary | ICD-10-CM | POA: Insufficient documentation

## 2015-09-18 DIAGNOSIS — T148XXA Other injury of unspecified body region, initial encounter: Secondary | ICD-10-CM

## 2015-09-18 DIAGNOSIS — Y9289 Other specified places as the place of occurrence of the external cause: Secondary | ICD-10-CM | POA: Insufficient documentation

## 2015-09-18 MED ORDER — CYCLOBENZAPRINE HCL 10 MG PO TABS: 10.0000 mg | ORAL_TABLET | Freq: Two times a day (BID) | ORAL | Status: DC | PRN

## 2015-09-18 NOTE — Discharge Instructions (Signed)

## 2015-09-18 NOTE — ED Notes (Signed)
Pt reports aching pain between her shoulder blades x 2 days. Pain increased yesterday to median area between shoulder blades. Tx with Tramadol and Celebrex without relief

## 2015-09-18 NOTE — ED Provider Notes (Signed)
CSN: [id number]     Arrival date & time 09/18/15  0957 History   First MD Initiated Contact with Patient 09/18/15 1007     Chief Complaint  Patient presents with  . Back Pain    upper back pain     (Consider location/radiation/quality/duration/timing/severity/associated sxs/prior Treatment) HPI Comments: Pt comes in with c/o upper back pain that started 2 days ago she has taken celebrex and tramadol with short term relief. Denies numbness, weakness, cp, sob. She states that she has sensation that runs down her arm. No fever or history of drug use.  The history is provided by the patient. No language interpreter was used.    History reviewed. No pertinent past medical history. Past Surgical History  Procedure Laterality Date  . Cesarean section    . Tubal ligation     Family History  Problem Relation Age of Onset  . Adopted: Yes   Social History  Substance Use Topics  . Smoking status: Current Some Day Smoker -- 0.25 packs/day  . Smokeless tobacco: Never Used  . Alcohol Use: No   OB History    No data available     Review of Systems  All other systems reviewed and are negative.     Allergies  Review of patient's allergies indicates no known allergies.  Home Medications   Prior to Admission medications   Medication Sig Start Date End Date Taking? Authorizing Provider  BIOTIN PO Take 1 tablet by mouth daily.    Historical Provider, MD  calcium carbonate (OS-CAL) 600 MG TABS Take 600 mg by mouth daily.    Historical Provider, MD  cyclobenzaprine (FLEXERIL) 10 MG tablet Take 1 tablet (10 mg total) by mouth 2 (two) times daily as needed for muscle spasms. 09/18/15   Glendell Docker, NP  fish oil-omega-3 fatty acids 1000 MG capsule Take 1 g by mouth daily.    Historical Provider, MD  Multiple Vitamin (MULITIVITAMIN WITH MINERALS) TABS Take 1 tablet by mouth daily.    Historical Provider, MD  sulfamethoxazole-trimethoprim (BACTRIM DS) 800-160 MG per tablet Take 1 tablet  by mouth 2 (two) times daily.    Historical Provider, MD   BP 140/80 mmHg  Pulse 78  Temp(Src) 98.7 F (37.1 C) (Oral)  Resp 16  SpO2 99% Physical Exam  Constitutional: She is oriented to person, place, and time. She appears well-developed and well-nourished.  Cardiovascular: Normal rate and regular rhythm.   Pulmonary/Chest: Effort normal.  Musculoskeletal: Normal range of motion.       Arms: Neurological: She is oriented to person, place, and time. She exhibits normal muscle tone. Coordination normal.  Skin: Skin is warm and dry.  Psychiatric: She has a normal mood and affect.  Nursing note and vitals reviewed.   ED Course  Procedures (including critical care time) Labs Review Labs Reviewed - No data to display  Imaging Review No results found. I have personally reviewed and evaluated these images and lab results as part of my medical decision-making.   EKG Interpretation None      MDM   Final diagnoses:  Muscle strain    Will treat with flexeril. No red flags. Discussed return precautions    Glendell Docker, NP 09/18/15 [place], MD 09/20/15 1134

## 2016-01-20 ENCOUNTER — Ambulatory Visit (INDEPENDENT_AMBULATORY_CARE_PROVIDER_SITE_OTHER): Payer: BLUE CROSS/BLUE SHIELD | Admitting: Internal Medicine

## 2016-01-20 ENCOUNTER — Encounter: Payer: Self-pay | Admitting: Internal Medicine

## 2016-01-20 VITALS — BP 122/80 | HR 76 | Temp 98.2°F | Ht 63.5 in | Wt 246.0 lb

## 2016-01-20 DIAGNOSIS — M79672 Pain in left foot: Secondary | ICD-10-CM

## 2016-01-20 DIAGNOSIS — F330 Major depressive disorder, recurrent, mild: Secondary | ICD-10-CM | POA: Insufficient documentation

## 2016-01-20 DIAGNOSIS — F329 Major depressive disorder, single episode, unspecified: Secondary | ICD-10-CM

## 2016-01-20 DIAGNOSIS — F32A Depression, unspecified: Secondary | ICD-10-CM

## 2016-01-20 DIAGNOSIS — F332 Major depressive disorder, recurrent severe without psychotic features: Secondary | ICD-10-CM | POA: Insufficient documentation

## 2016-01-20 DIAGNOSIS — M545 Low back pain, unspecified: Secondary | ICD-10-CM

## 2016-01-20 DIAGNOSIS — R519 Headache, unspecified: Secondary | ICD-10-CM | POA: Insufficient documentation

## 2016-01-20 DIAGNOSIS — M159 Polyosteoarthritis, unspecified: Secondary | ICD-10-CM | POA: Insufficient documentation

## 2016-01-20 DIAGNOSIS — K219 Gastro-esophageal reflux disease without esophagitis: Secondary | ICD-10-CM | POA: Diagnosis not present

## 2016-01-20 DIAGNOSIS — R51 Headache: Secondary | ICD-10-CM

## 2016-01-20 DIAGNOSIS — M79671 Pain in right foot: Secondary | ICD-10-CM

## 2016-01-20 MED ORDER — SERTRALINE HCL 50 MG PO TABS: 50.0000 mg | ORAL_TABLET | Freq: Every day | ORAL | Status: DC

## 2016-01-20 MED ORDER — OMEPRAZOLE 20 MG PO CPDR: 20.0000 mg | DELAYED_RELEASE_CAPSULE | Freq: Every day | ORAL | Status: DC

## 2016-01-20 NOTE — Progress Notes (Signed)
Pre visit review using our clinic review tool, if applicable. No additional management support is needed unless otherwise documented below in the visit note. 

## 2016-01-20 NOTE — Assessment & Plan Note (Signed)
Discussed avoiding triggers Discussed how weight loss can help improve symptoms eRx for Prilosec 20 mg daily Ok to take Tums or Rolaids as needed

## 2016-01-20 NOTE — Patient Instructions (Signed)
Major Depressive Disorder Major depressive disorder is a mental illness. It also may be called clinical depression or unipolar depression. Major depressive disorder usually causes feelings of sadness, hopelessness, or helplessness. Some people with this disorder do not feel particularly sad but lose interest in doing things they used to enjoy (anhedonia). Major depressive disorder also can cause physical symptoms. It can interfere with work, school, relationships, and other normal everyday activities. The disorder varies in severity but is longer lasting and more serious than the sadness we all feel from time to time in our lives. Major depressive disorder often is triggered by stressful life events or major life changes. Examples of these triggers include divorce, loss of your job or home, a move, and the death of a family member or close friend. Sometimes this disorder occurs for no obvious reason at all. People who have family members with major depressive disorder or bipolar disorder are at higher risk for developing this disorder, with or without life stressors. Major depressive disorder can occur at any age. It may occur just once in your life (single episode major depressive disorder). It may occur multiple times (recurrent major depressive disorder). SYMPTOMS People with major depressive disorder have either anhedonia or depressed mood on nearly a daily basis for at least 2 weeks or longer. Symptoms of depressed mood include:  Feelings of sadness (blue or down in the dumps) or emptiness.  Feelings of hopelessness or helplessness.  Tearfulness or episodes of crying (may be observed by others).  Irritability (children and adolescents). In addition to depressed mood or anhedonia or both, people with this disorder have at least four of the following symptoms:  Difficulty sleeping or sleeping too much.   Significant change (increase or decrease) in appetite or weight.   Lack of energy or  motivation.  Feelings of guilt and worthlessness.   Difficulty concentrating, remembering, or making decisions.  Unusually slow movement (psychomotor retardation) or restlessness (as observed by others).   Recurrent wishes for death, recurrent thoughts of self-harm (suicide), or a suicide attempt. People with major depressive disorder commonly have persistent negative thoughts about themselves, other people, and the world. People with severe major depressive disorder may experiencedistorted beliefs or perceptions about the world (psychotic delusions). They also may see or hear things that are not real (psychotic hallucinations). DIAGNOSIS Major depressive disorder is diagnosed through an assessment by your health care provider. Your health care provider will ask aboutaspects of your daily life, such as mood,sleep, and appetite, to see if you have the diagnostic symptoms of major depressive disorder. Your health care provider may ask about your medical history and use of alcohol or drugs, including prescription medicines. Your health care provider also may do a physical exam and blood work. This is because certain medical conditions and the use of certain substances can cause major depressive disorder-like symptoms (secondary depression). Your health care provider also may refer you to a mental health specialist for further evaluation and treatment. TREATMENT It is important to recognize the symptoms of major depressive disorder and seek treatment. The following treatments can be prescribed for this disorder:   Medicine. Antidepressant medicines usually are prescribed. Antidepressant medicines are thought to correct chemical imbalances in the brain that are commonly associated with major depressive disorder. Other types of medicine may be added if the symptoms do not respond to antidepressant medicines alone or if psychotic delusions or hallucinations occur.  Talk therapy. Talk therapy can be  helpful in treating major depressive disorder by providing   support, education, and guidance. Certain types of talk therapy also can help with negative thinking (cognitive behavioral therapy) and with relationship issues that trigger this disorder (interpersonal therapy). A mental health specialist can help determine which treatment is best for you. Most people with major depressive disorder do well with a combination of medicine and talk therapy. Treatments involving electrical stimulation of the brain can be used in situations with extremely severe symptoms or when medicine and talk therapy do not work over time. These treatments include electroconvulsive therapy, transcranial magnetic stimulation, and vagal nerve stimulation.   This information is not intended to replace advice given to you by your health care provider. Make sure you discuss any questions you have with your health care provider.   Document Released: 04/08/2013 Document Revised: 01/02/2015 Document Reviewed: 04/08/2013 Elsevier Interactive Patient Education 2016 [employer]  

## 2016-01-20 NOTE — Assessment & Plan Note (Signed)
Uncontrolled Support offered today eRx for Zoloft 50 mg daily Referral placed for Psychology
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
## 2016-01-20 NOTE — Assessment & Plan Note (Signed)
Likely stress related Continue Fioricet as needed

## 2016-01-20 NOTE — Assessment & Plan Note (Addendum)
Exacerbated by weight Try Aleve prn  Referral placed to Ortho

## 2016-02-16 ENCOUNTER — Encounter: Payer: Self-pay | Admitting: Internal Medicine

## 2016-03-17 ENCOUNTER — Encounter: Payer: Self-pay | Admitting: Internal Medicine

## 2016-03-17 ENCOUNTER — Other Ambulatory Visit: Payer: Self-pay | Admitting: Internal Medicine

## 2016-03-17 MED ORDER — VENLAFAXINE HCL 37.5 MG PO TABS: 37.5000 mg | ORAL_TABLET | Freq: Two times a day (BID) | ORAL | Status: DC

## 2016-04-19 ENCOUNTER — Ambulatory Visit: Payer: BLUE CROSS/BLUE SHIELD | Admitting: Internal Medicine

## 2016-09-07 ENCOUNTER — Emergency Department (HOSPITAL_COMMUNITY): Payer: BLUE CROSS/BLUE SHIELD

## 2016-09-07 ENCOUNTER — Encounter (HOSPITAL_COMMUNITY): Payer: Self-pay

## 2016-09-07 ENCOUNTER — Emergency Department (HOSPITAL_COMMUNITY)
Admission: EM | Admit: 2016-09-07 | Discharge: 2016-09-07 | Disposition: A | Discharge status: Home / Self Care | Payer: BLUE CROSS/BLUE SHIELD | Attending: Emergency Medicine | Admitting: Emergency Medicine

## 2016-09-07 DIAGNOSIS — Z87891 Personal history of nicotine dependence: Secondary | ICD-10-CM | POA: Insufficient documentation

## 2016-09-07 DIAGNOSIS — R51 Headache: Secondary | ICD-10-CM | POA: Diagnosis not present

## 2016-09-07 DIAGNOSIS — R519 Headache, unspecified: Secondary | ICD-10-CM

## 2016-09-07 DIAGNOSIS — R002 Palpitations: Secondary | ICD-10-CM | POA: Diagnosis not present

## 2016-09-07 LAB — CBC
HEMATOCRIT: 47.1 % — AB (ref 36.0–46.0)
Hemoglobin: 16.2 g/dL — ABNORMAL HIGH (ref 12.0–15.0)
MCH: 32.5 pg (ref 26.0–34.0)
MCHC: 34.4 g/dL (ref 30.0–36.0)
MCV: 94.4 fL (ref 78.0–100.0)
Platelets: 194 10*3/uL (ref 150–400)
RBC: 4.99 MIL/uL (ref 3.87–5.11)
RDW: 12.9 % (ref 11.5–15.5)
WBC: 6.3 10*3/uL (ref 4.0–10.5)

## 2016-09-07 LAB — BASIC METABOLIC PANEL
Anion gap: 6 (ref 5–15)
BUN: 11 mg/dL (ref 6–20)
CHLORIDE: 107 mmol/L (ref 101–111)
CO2: 24 mmol/L (ref 22–32)
Calcium: 9.3 mg/dL (ref 8.9–10.3)
Creatinine, Ser: 0.78 mg/dL (ref 0.44–1.00)
GFR calc Af Amer: >60 mL/min (ref >60)
GLUCOSE: 92 mg/dL (ref 65–99)
POTASSIUM: 4.3 mmol/L (ref 3.5–5.1)
Sodium: 137 mmol/L (ref 135–145)

## 2016-09-07 LAB — I-STAT TROPONIN, ED: Troponin i, poc: 0.01 ng/mL (ref 0.00–0.08)

## 2016-09-07 LAB — I-STAT BETA HCG BLOOD, ED (MC, WL, AP ONLY)

## 2016-09-07 MED ORDER — KETOROLAC TROMETHAMINE 30 MG/ML IJ SOLN
30.0000 mg | Freq: Once | INTRAMUSCULAR | Status: AC
Start: 2016-09-07 — End: 2016-09-07
  Administered 2016-09-07: 30 mg via INTRAVENOUS
  Filled 2016-09-07: qty 1

## 2016-09-07 MED ORDER — METOCLOPRAMIDE HCL 5 MG/ML IJ SOLN
10.0000 mg | Freq: Once | INTRAMUSCULAR | Status: AC
  Administered 2016-09-07: 10 mg via INTRAVENOUS
  Filled 2016-09-07: qty 2

## 2016-09-07 MED ORDER — BUTALBITAL-APAP-CAFFEINE 50-325-40 MG PO TABS: 1.0000 | ORAL_TABLET | Freq: Four times a day (QID) | ORAL | 0 refills | Status: AC | PRN

## 2016-09-07 MED ORDER — SODIUM CHLORIDE 0.9 % IV BOLUS (SEPSIS)
1000.0000 mL | Freq: Once | INTRAVENOUS | Status: AC
  Administered 2016-09-07: 1000 mL via INTRAVENOUS

## 2016-09-07 MED ORDER — DIPHENHYDRAMINE HCL 50 MG/ML IJ SOLN
12.5000 mg | Freq: Once | INTRAMUSCULAR | Status: AC
  Administered 2016-09-07: 12.5 mg via INTRAVENOUS
  Filled 2016-09-07: qty 1

## 2016-09-07 NOTE — ED Triage Notes (Addendum)
Pt c/o chest palpitations, lightheadedness and HA x2 days. Pt denies sob. Pt denies chest pain. Pt denies n/v. Pt A+O4.

## 2016-09-07 NOTE — ED Provider Notes (Signed)
[facility] DEPT Provider Note   CSN: [id number] Arrival date & time: 09/07/16  1618  History   Chief Complaint Chief Complaint  Patient presents with  . Palpitations  . Dizziness  . Headache    HPI  [name] is an 45 y.o. female with history of chronic headaches who presents to the ED for evaluation of headache and palpitations. She states for the past two days she has had a constant, gradual onset headache that starts in her occiput and radiates to her right frontal region. The headache is somewhat similar to her past headaches but also different as her typical headaches do not radiate and usually resolve with the buspar medication she is prescribed as needed for headaches. States she took the buspar with no relief. She states today she was at work (works as a Occupational psychologist) and started feeling lightheaded for a few seconds when her headache got bad. She states the lightheadedness subsided and has not returned. However, then she states she felt like her heart was "flip flopping" so she decided to come to the ED. In the ED now her only complaint is the headache. The palpitations and lightheadedness have not returned. She denies photophobia, phonophobia, blurred vision, syncope, weakness, numbness. Denies fever or chills. She has never been evaluated by a neurologist for her frequent headaches.  Past Medical History:  Diagnosis Date  . Arthritis   . Depression   . Frequent headaches   . GERD (gastroesophageal reflux disease)     Patient Active Problem List   Diagnosis Date Noted  . Depression 01/20/2016  . Esophageal reflux 01/20/2016  . Midline low back pain without sciatica 01/20/2016  . Frequent headaches 01/20/2016    Past Surgical History:  Procedure Laterality Date  . APPENDECTOMY    . CESAREAN SECTION    . TUBAL LIGATION      OB History    No data available       Home Medications    Prior to Admission medications   Medication Sig Start Date End Date  Taking? Authorizing Provider  omeprazole (PRILOSEC) 20 MG capsule Take 1 capsule (20 mg total) by mouth daily. 01/20/16   Jearld Fenton, NP  ranitidine (ZANTAC) 150 MG tablet Take 150 mg by mouth as needed for heartburn.    Historical Provider, MD  venlafaxine (EFFEXOR) 37.5 MG tablet Take 1 tablet (37.5 mg total) by mouth 2 (two) times daily with a meal. 03/17/16   Jearld Fenton, NP    Family History Family History  Problem Relation Age of Onset  . Adopted: Yes    Social History Social History  Substance Use Topics  . Smoking status: Former Smoker    Packs/day: 0.25  . Smokeless tobacco: Never Used     Comment: quit 2010  . Alcohol use 0.0 oz/week     Comment: occasional     Allergies   Review of patient's allergies indicates no known allergies.   Review of Systems Review of Systems 10 Systems reviewed and are negative for acute change except as noted in the HPI.  Physical Exam Updated Vital Signs BP 128/81   Pulse 77   Temp 98.6 F (37 C) (Oral)   Resp 23   SpO2 97%   Physical Exam  Constitutional: She is oriented to person, place, and time.  HENT:  Right Ear: External ear normal.  Left Ear: External ear normal.  Nose: Nose normal.  Mouth/Throat: Oropharynx is clear and moist. No oropharyngeal exudate.  Eyes: Conjunctivae and EOM are normal. Pupils are equal, round, and reactive to light.  Neck: Neck supple.  No meningismus  Cardiovascular: Normal rate, regular rhythm, normal heart sounds and intact distal pulses.   Pulmonary/Chest: Effort normal and breath sounds normal. No respiratory distress. She has no wheezes.  Abdominal: Soft. Bowel sounds are normal. She exhibits no distension. There is no tenderness. There is no rebound and no guarding.  Musculoskeletal: She exhibits no edema.  Lymphadenopathy:    She has no cervical adenopathy.  Neurological: She is alert and oriented to person, place, and time. No cranial nerve deficit.  5/5 strength bilateral UE  and LE Moves all extremities freely  Normal finger to nose No pronator drift Steady gait  Skin: Skin is warm and dry.  Psychiatric: She has a normal mood and affect.  Nursing note and vitals reviewed.    ED Treatments / Results  Labs (all labs ordered are listed, but only abnormal results are displayed) Labs Reviewed  CBC - Abnormal; Notable for the following:       Result Value   Hemoglobin 16.2 (*)    HCT 47.1 (*)    All other components within normal limits  BASIC METABOLIC PANEL  I-STAT TROPOININ, ED  I-STAT BETA HCG BLOOD, ED ([facility], [facility], [facility] ONLY)    EKG  EKG Interpretation None       Radiology Dg Chest 2 View  Result Date: 09/07/2016 CLINICAL DATA:  Chest palpitations. Right arm numbness and tingling. EXAM: CHEST  2 VIEW COMPARISON:  None. FINDINGS: The cardiomediastinal contours are normal. The lungs are clear. Pulmonary vasculature is normal. No consolidation, pleural effusion, or pneumothorax. No acute osseous abnormalities are seen. IMPRESSION: No acute pulmonary process. Electronically Signed   By: Jeb Levering M.D.   On: 09/07/2016 18:07    Procedures Procedures (including critical care time)  Medications Ordered in ED Medications  sodium chloride 0.9 % bolus 1,000 mL (0 mLs Intravenous Stopped 09/07/16 1939)  ketorolac (TORADOL) 30 MG/ML injection 30 mg (30 mg Intravenous Given 09/07/16 1824)  metoCLOPramide (REGLAN) injection 10 mg (10 mg Intravenous Given 09/07/16 1822)  diphenhydrAMINE (BENADRYL) injection 12.5 mg (12.5 mg Intravenous Given 09/07/16 1826)     Initial Impression / Assessment and Plan / ED Course  I have reviewed the triage vital signs and the nursing notes.  Pertinent labs & imaging results that were available during my care of the patient were reviewed by me and considered in my medical decision making (see chart for details).  Clinical Course    Headache completely resolved with cocktail. Repeat exam remains benign and nonfocal.  No meningismus. Has had no further palpitations. She is ambulatory with steady gait. Labs otherwise unrevealing. She has no other complaints. Encouraged close f/u with PCP. Referral given to neurology for chronic headaches. Strict ER return precautions given.  Final Clinical Impressions(s) / ED Diagnoses   Final diagnoses:  Acute nonintractable headache, unspecified headache type  Palpitations    New Prescriptions Discharge Medication List as of 09/07/2016  7:06 PM    START taking these medications   Details  butalbital-acetaminophen-caffeine (FIORICET, ESGIC) 50-325-40 MG tablet Take 1-2 tablets by mouth every 6 (six) hours as needed for headache., Starting Wed 09/07/2016, Until Thu 09/07/2017, Print         Anne Ng, PA-C 09/08/16 8115    Tanna Furry, MD 09/13/16 1447

## 2016-09-07 NOTE — ED Notes (Signed)
IV attempted x 4 by 2 different persons.

## 2016-11-10 ENCOUNTER — Emergency Department (HOSPITAL_COMMUNITY): Payer: BLUE CROSS/BLUE SHIELD

## 2016-11-10 ENCOUNTER — Emergency Department (HOSPITAL_COMMUNITY)
Admission: EM | Admit: 2016-11-10 | Discharge: 2016-11-10 | Disposition: A | Discharge status: Home / Self Care | Payer: BLUE CROSS/BLUE SHIELD | Attending: Emergency Medicine | Admitting: Emergency Medicine

## 2016-11-10 ENCOUNTER — Encounter (HOSPITAL_COMMUNITY): Payer: Self-pay

## 2016-11-10 DIAGNOSIS — S52572A Other intraarticular fracture of lower end of left radius, initial encounter for closed fracture: Secondary | ICD-10-CM | POA: Diagnosis not present

## 2016-11-10 DIAGNOSIS — Z87891 Personal history of nicotine dependence: Secondary | ICD-10-CM | POA: Diagnosis not present

## 2016-11-10 DIAGNOSIS — Y999 Unspecified external cause status: Secondary | ICD-10-CM | POA: Insufficient documentation

## 2016-11-10 DIAGNOSIS — Y92009 Unspecified place in unspecified non-institutional (private) residence as the place of occurrence of the external cause: Secondary | ICD-10-CM | POA: Diagnosis not present

## 2016-11-10 DIAGNOSIS — Y939 Activity, unspecified: Secondary | ICD-10-CM | POA: Insufficient documentation

## 2016-11-10 DIAGNOSIS — S6992XA Unspecified injury of left wrist, hand and finger(s), initial encounter: Secondary | ICD-10-CM | POA: Diagnosis present

## 2016-11-10 DIAGNOSIS — W010XXA Fall on same level from slipping, tripping and stumbling without subsequent striking against object, initial encounter: Secondary | ICD-10-CM | POA: Insufficient documentation

## 2016-11-10 MED ORDER — NAPROXEN 500 MG PO TABS: 500.0000 mg | ORAL_TABLET | Freq: Two times a day (BID) | ORAL | 0 refills | Status: DC

## 2016-11-10 MED ORDER — IBUPROFEN 800 MG PO TABS
800.0000 mg | ORAL_TABLET | Freq: Once | ORAL | Status: AC
  Administered 2016-11-10: 800 mg via ORAL
  Filled 2016-11-10: qty 1

## 2016-11-10 MED ORDER — OXYCODONE-ACETAMINOPHEN 5-325 MG PO TABS: 1.0000 | ORAL_TABLET | ORAL | 0 refills | Status: DC | PRN

## 2016-11-10 NOTE — ED Triage Notes (Addendum)
Pt presents with c/o left wrist injury. Pt reports she slipped and fell this morning, landing on her left wrist. Pt does have some swelling and deformity noted to that wrist. Positive pulses.

## 2016-11-10 NOTE — ED Provider Notes (Signed)
[facility] DEPT Provider Note   CSN: [id number] Arrival date & time: 11/10/16  0907     History   Chief Complaint Chief Complaint  Patient presents with  . Wrist Pain    HPI [name] is a 45 y.o. female.  HPI  45 year old female presents with a left wrist injury. She was walking down the wheelchair ramp at her house and slipped on ice. She landed with her hand outstretched. Did not hit her head or lose consciousness. Has some numbness and tingling to the fingertips but denies weakness or numbness otherwise. The pain is in her left wrist with swelling as well as proximal to her thumb. No forearm pain otherwise. She drove herself here. Has not taken anything for the pain.  Past Medical History:  Diagnosis Date  . Arthritis   . Depression   . Frequent headaches   . GERD (gastroesophageal reflux disease)     Patient Active Problem List   Diagnosis Date Noted  . Depression 01/20/2016  . Esophageal reflux 01/20/2016  . Midline low back pain without sciatica 01/20/2016  . Frequent headaches 01/20/2016    Past Surgical History:  Procedure Laterality Date  . APPENDECTOMY    . CESAREAN SECTION    . TUBAL LIGATION      OB History    No data available       Home Medications    Prior to Admission medications   Medication Sig Start Date End Date Taking? Authorizing Provider  acidophilus (RISAQUAD) CAPS capsule Take 1 capsule by mouth daily.   Yes Historical Provider, MD  ranitidine (ZANTAC) 150 MG tablet Take 150 mg by mouth as needed for heartburn.   Yes Historical Provider, MD  butalbital-acetaminophen-caffeine (FIORICET, ESGIC) 50-325-40 MG tablet Take 1-2 tablets by mouth every 6 (six) hours as needed for headache. Patient not taking: Reported on 11/10/2016 09/07/16 09/07/17  Olivia Canter Sam, PA-C  naproxen (NAPROSYN) 500 MG tablet Take 1 tablet (500 mg total) by mouth 2 (two) times daily. 11/10/16   Sherwood Gambler, MD  oxyCODONE-acetaminophen (PERCOCET) 5-325  MG tablet Take 1 tablet by mouth every 4 (four) hours as needed for severe pain. 11/10/16   Sherwood Gambler, MD  venlafaxine (EFFEXOR) 37.5 MG tablet Take 1 tablet (37.5 mg total) by mouth 2 (two) times daily with a meal. Patient not taking: Reported on 11/10/2016 03/17/16   Jearld Fenton, NP    Family History Family History  Problem Relation Age of Onset  . Adopted: Yes    Social History Social History  Substance Use Topics  . Smoking status: Former Smoker    Packs/day: 0.25  . Smokeless tobacco: Never Used     Comment: quit 2010  . Alcohol use 0.0 oz/week     Comment: occasional     Allergies   Patient has no known allergies.   Review of Systems Review of Systems  Musculoskeletal: Positive for arthralgias and joint swelling.  Skin: Negative for wound.  Neurological: Positive for numbness (fingertips). Negative for weakness.  All other systems reviewed and are negative.    Physical Exam Updated Vital Signs BP (!) 143/101 (BP Location: Right Arm)   Pulse 77   Temp 97.8 F (36.6 C) (Oral)   Resp 18   Ht '5\' 3"'$  (1.6 m)   Wt 248 lb (112.5 kg)   SpO2 100%   BMI 43.93 kg/m   Physical Exam  Constitutional: She is oriented to person, place, and time. She appears well-developed and well-nourished.  HENT:  Head: Normocephalic and atraumatic.  Right Ear: External ear normal.  Left Ear: External ear normal.  Nose: Nose normal.  Eyes: Right eye exhibits no discharge. Left eye exhibits no discharge.  Cardiovascular: Normal rate and regular rhythm.   Pulses:      Radial pulses are 2+ on the left side.  Pulmonary/Chest: Effort normal.  Abdominal: She exhibits no distension.  Musculoskeletal:       Left elbow: She exhibits normal range of motion. No tenderness found.       Left wrist: She exhibits decreased range of motion, tenderness, bony tenderness and swelling. She exhibits no deformity and no laceration.       Left forearm: She exhibits no tenderness.       Left  hand: She exhibits tenderness (over scaphoid).  Normal rom of hand but with pain. Normal sensation  Neurological: She is alert and oriented to person, place, and time.  Skin: Skin is warm and dry.  Nursing note and vitals reviewed.    ED Treatments / Results  Labs (all labs ordered are listed, but only abnormal results are displayed) Labs Reviewed - No data to display  EKG  EKG Interpretation None       Radiology Dg Wrist Complete Left  Result Date: 11/10/2016 CLINICAL DATA:  Fall. EXAM: LEFT WRIST - COMPLETE 3+ VIEW COMPARISON:  None. FINDINGS: There is an acute, comminuted, intra-articular fracture deformity distal radius. Fracture fragments are mildly impacted. Marked dorsal soft tissue swelling. IMPRESSION: 1. Acute, comminuted, intra-articular and impacted fracture involves the distal radius. 2. Dorsal soft tissue swelling. Electronically Signed   By: Kerby Moors M.D.   On: 11/10/2016 09:44   Dg Hand Complete Left  Result Date: 11/10/2016 CLINICAL DATA:  Fall, pain in the left hand, distal radial fracture. EXAM: LEFT HAND - COMPLETE 3+ VIEW COMPARISON:  None. FINDINGS: Comminuted fracture of the distal radius involves the metaphysis and distal articular surface, with surrounding soft tissue swelling. Mildly widened scapholunate space with scapholunate angle of 80 degrees. IMPRESSION: 1. Comminuted distal radial fracture. 2. High suspicion for torn scapholunate ligament, with mildly widened scapholunate space and abnormally elevated scapholunate angle. Electronically Signed   By: Van Clines M.D.   On: 11/10/2016 10:30    Procedures Procedures (including critical care time)  Medications Ordered in ED Medications  ibuprofen (ADVIL,MOTRIN) tablet 800 mg (800 mg Oral Given 11/10/16 1020)     Initial Impression / Assessment and Plan / ED Course  I have reviewed the triage vital signs and the nursing notes.  Pertinent labs & imaging results that were available  during my care of the patient were reviewed by me and considered in my medical decision making (see chart for details).  Clinical Course as of Nov 10 1150  Thu Nov 10, 2016  1000 Since she drove, will get ibuprofen. Ice, elevate, and on hand x-ray given significant scaphoid tenderness.  [SG]  1050 D/w Dr. Levell July nurse, she will pass along xrays and call me back. He is currently in surgery  [SG]    Clinical Course User Index [SG] Sherwood Gambler, MD    Dr Levell July Nurse called back and asked for immobilization and follow-up in the office next week. Patient remains neurovascular intact. Splint, NSAIDs, Percocet for severe pain. Ice and elevation. Discussed return precautions and need for follow-up.  Final Clinical Impressions(s) / ED Diagnoses   Final diagnoses:  Other closed intra-articular fracture of distal end of left radius, initial encounter  Scapholunate ligament  injury with no instability, left, initial encounter    New Prescriptions New Prescriptions   NAPROXEN (NAPROSYN) 500 MG TABLET    Take 1 tablet (500 mg total) by mouth 2 (two) times daily.   OXYCODONE-ACETAMINOPHEN (PERCOCET) 5-325 MG TABLET    Take 1 tablet by mouth every 4 (four) hours as needed for severe pain.     Sherwood Gambler, MD 11/10/16 [phone]

## 2016-11-10 NOTE — ED Notes (Signed)
Bed: KC00 Expected date:  Expected time:  Means of arrival:  Comments: Waiting for splint

## 2016-11-13 ENCOUNTER — Emergency Department (HOSPITAL_COMMUNITY)
Admission: EM | Admit: 2016-11-13 | Discharge: 2016-11-13 | Disposition: A | Discharge status: Home / Self Care | Payer: BLUE CROSS/BLUE SHIELD | Attending: Emergency Medicine | Admitting: Emergency Medicine

## 2016-11-13 ENCOUNTER — Encounter (HOSPITAL_COMMUNITY): Payer: Self-pay | Admitting: Emergency Medicine

## 2016-11-13 DIAGNOSIS — Z791 Long term (current) use of non-steroidal anti-inflammatories (NSAID): Secondary | ICD-10-CM | POA: Insufficient documentation

## 2016-11-13 DIAGNOSIS — Z87891 Personal history of nicotine dependence: Secondary | ICD-10-CM | POA: Diagnosis not present

## 2016-11-13 DIAGNOSIS — Z4689 Encounter for fitting and adjustment of other specified devices: Secondary | ICD-10-CM | POA: Diagnosis present

## 2016-11-13 DIAGNOSIS — Z4789 Encounter for other orthopedic aftercare: Secondary | ICD-10-CM

## 2016-11-13 NOTE — ED Triage Notes (Signed)
Pt from home with complaints of arm pain. Pt broke her wrist on 11/16 and had a splint placed on that day. She followed up with ortho who removed and replaced the splint. Pt is scheduled for surgery on 11/27. Pt states that ever since they replaced the splint, she has had extreme pain in her arm. Pt is able to wiggle her fingers and appears to have adequate oxygenation of her fingers.

## 2016-11-13 NOTE — ED Provider Notes (Signed)
[facility] DEPT Provider Note   CSN: [id number] Arrival date & time: 11/13/16  2046     History   Chief Complaint Chief Complaint  Patient presents with  . Arm Pain    HPI [name] is a 45 y.o. female.  Patient presents to the emergency department with chief complaint of splint complication. She reports that she recently fractured her wrist, and had the splint changed at her orthopedic doctor's office. She states that the knee splint is not as comfortable as the old, and is causing her more pain. She liked see if she can have the splint switched. She denies numbness, weakness, or tingling. She does report persistent pain. She is scheduled to have surgery a week from tomorrow.   The history is provided by the patient. No language interpreter was used.    Past Medical History:  Diagnosis Date  . Arthritis   . Depression   . Frequent headaches   . GERD (gastroesophageal reflux disease)     Patient Active Problem List   Diagnosis Date Noted  . Depression 01/20/2016  . Esophageal reflux 01/20/2016  . Midline low back pain without sciatica 01/20/2016  . Frequent headaches 01/20/2016    Past Surgical History:  Procedure Laterality Date  . APPENDECTOMY    . CESAREAN SECTION    . TUBAL LIGATION      OB History    No data available       Home Medications    Prior to Admission medications   Medication Sig Start Date End Date Taking? Authorizing Provider  acidophilus (RISAQUAD) CAPS capsule Take 1 capsule by mouth daily.   Yes Historical Provider, MD  naproxen (NAPROSYN) 500 MG tablet Take 1 tablet (500 mg total) by mouth 2 (two) times daily. Patient taking differently: Take 500 mg by mouth 2 (two) times daily as needed for mild pain or moderate pain.  11/10/16  Yes Sherwood Gambler, MD  oxyCODONE-acetaminophen (PERCOCET) 5-325 MG tablet Take 1 tablet by mouth every 4 (four) hours as needed for severe pain. 11/10/16  Yes Sherwood Gambler, MD  ranitidine  (ZANTAC) 150 MG tablet Take 150 mg by mouth as needed for heartburn.   Yes Historical Provider, MD  butalbital-acetaminophen-caffeine (FIORICET, ESGIC) 50-325-40 MG tablet Take 1-2 tablets by mouth every 6 (six) hours as needed for headache. Patient not taking: Reported on 11/13/2016 09/07/16 09/07/17  Olivia Canter Sam, PA-C  venlafaxine [facility]) 37.5 MG tablet Take 1 tablet (37.5 mg total) by mouth 2 (two) times daily with a meal. Patient not taking: Reported on 11/13/2016 03/17/16   Jearld Fenton, NP    Family History Family History  Problem Relation Age of Onset  . Adopted: Yes    Social History Social History  Substance Use Topics  . Smoking status: Former Smoker    Packs/day: 0.25  . Smokeless tobacco: Never Used     Comment: quit 2010  . Alcohol use 0.0 oz/week     Comment: occasional     Allergies   Patient has no known allergies.   Review of Systems Review of Systems  Musculoskeletal: Positive for arthralgias and joint swelling.  All other systems reviewed and are negative.    Physical Exam Updated Vital Signs BP 138/88 (BP Location: Left Arm)   Pulse 85   Temp 98.2 F (36.8 C) (Oral)   Resp 16   Ht '5\' 3"'$  (1.6 m)   Wt 117.3 kg   SpO2 100%   BMI 45.79 kg/m   Physical  Exam  Constitutional: She is oriented to person, place, and time. No distress.  HENT:  Head: Normocephalic and atraumatic.  Eyes: Conjunctivae and EOM are normal. Pupils are equal, round, and reactive to light.  Neck: No tracheal deviation present.  Cardiovascular: Normal rate and intact distal pulses.   Brisk capillary refill  Pulmonary/Chest: Effort normal. No respiratory distress.  Abdominal: Soft.  Musculoskeletal: Normal range of motion.  Left wrist moderately swollen, tender to palpation, no obvious bony deformity or abnormality, range of motion strength of fingers is 5/5, wrist flexion and extension limited secondary to pain  Neurological: She is alert and oriented to person, place,  and time.  Sensation intact  Skin: Skin is warm and dry. She is not diaphoretic.  No evidence of cellulitis  Psychiatric: Judgment normal.  Nursing note and vitals reviewed.    ED Treatments / Results  Labs (all labs ordered are listed, but only abnormal results are displayed) Labs Reviewed - No data to display  EKG  EKG Interpretation None       Radiology No results found.  Procedures Procedures (including critical care time)  Medications Ordered in ED Medications - No data to display   Initial Impression / Assessment and Plan / ED Course  I have reviewed the triage vital signs and the nursing notes.  Pertinent labs & imaging results that were available during my care of the patient were reviewed by me and considered in my medical decision making (see chart for details).  Clinical Course     SPLINT APPLICATION Date/Time: 49:17 PM Authorized by: Montine Circle Consent: Verbal consent obtained. Risks and benefits: risks, benefits and alternatives were discussed Consent given by: patient Splint applied by: orthopedic technician Location details: left wrist Splint type: short arm Supplies used: fiberglass Post-procedure: The splinted body part was neurovascularly unchanged following the procedure. Patient tolerance: Patient tolerated the procedure well with no immediate complications.  10:36 PM Patient feels much better with new splint.     Final Clinical Impressions(s) / ED Diagnoses   Final diagnoses:  Aftercare for cast or splint check or change    New Prescriptions New Prescriptions   No medications on file     Montine Circle, PA-C 11/13/16 2236    Duffy Bruce, MD 11/14/16 1600

## 2016-11-21 HISTORY — PX: WRIST SURGERY: SHX841

## 2016-11-25 ENCOUNTER — Encounter: Payer: Self-pay | Admitting: Family Medicine

## 2016-11-25 ENCOUNTER — Ambulatory Visit (INDEPENDENT_AMBULATORY_CARE_PROVIDER_SITE_OTHER): Payer: BLUE CROSS/BLUE SHIELD | Admitting: Family Medicine

## 2016-11-25 ENCOUNTER — Other Ambulatory Visit: Payer: Self-pay | Admitting: Family Medicine

## 2016-11-25 VITALS — BP 120/80 | HR 86 | Temp 98.0°F | Wt 254.0 lb

## 2016-11-25 DIAGNOSIS — F419 Anxiety disorder, unspecified: Secondary | ICD-10-CM

## 2016-11-25 DIAGNOSIS — F32A Depression, unspecified: Secondary | ICD-10-CM

## 2016-11-25 DIAGNOSIS — F329 Major depressive disorder, single episode, unspecified: Secondary | ICD-10-CM

## 2016-11-25 DIAGNOSIS — N898 Other specified noninflammatory disorders of vagina: Secondary | ICD-10-CM | POA: Diagnosis not present

## 2016-11-25 DIAGNOSIS — F418 Other specified anxiety disorders: Secondary | ICD-10-CM | POA: Diagnosis not present

## 2016-11-25 MED ORDER — VENLAFAXINE HCL 37.5 MG PO TABS: ORAL_TABLET | ORAL | 0 refills | Status: DC

## 2016-11-25 NOTE — Patient Instructions (Signed)
Please restart your venlafaxine- I have sent in 37.5 mg tablets for you to start twice a day for a week then increase to 2 tablets twice a day. When you are at the end of this prescription please send a mychart message to me or Rollene Fare to send in increased dose. Please follow up with Rollene Fare in 2 months. I will notify you of your wet prep results

## 2016-11-25 NOTE — Progress Notes (Signed)
Pre visit review using our clinic review tool, if applicable. No additional management support is needed unless otherwise documented below in the visit note. 

## 2016-11-25 NOTE — Progress Notes (Signed)
   Subjective:    Patient ID: [name], female    DOB: 1971/08/13, 45 y.o.   MRN: [id number]  HPI This is a 45 yo female who presents today with vaginal swelling and irritation x 10 days. No discharge. Swelling a little better over last several days. No abdominal pain, nausea or vomiting. No dysuria, no frequency, no hematuria, no back pain. Not sexually active.   Was on effexor 37.5 mg and she took for about 1 month but didn't feel any different. Is under stress due to daughter's mental illness and drug use- she has not seen her in many months. Patient has resumed light cigarette smoking. She feels short with customers and coworkers, doesn't have anyone to talk to. Has tried sertraline in past but didn't feel it helped. Recently broke left arm and had surgery, in splint.   Past Medical History:  Diagnosis Date  . Arthritis   . Depression   . Frequent headaches   . GERD (gastroesophageal reflux disease)    Past Surgical History:  Procedure Laterality Date  . APPENDECTOMY    . CESAREAN SECTION    . TUBAL LIGATION     Family History  Problem Relation Age of Onset  . Adopted: Yes   Social History  Substance Use Topics  . Smoking status: Former Smoker    Packs/day: 0.25  . Smokeless tobacco: Never Used     Comment: quit 2010  . Alcohol use 0.0 oz/week     Comment: occasional      Review of Systems Per HPI    Objective:   Physical Exam Physical Exam  Vitals reviewed. Constitutional: Oriented to person, place, and time. Appears well-developed and well-nourished.  HENT:  Head: Normocephalic and atraumatic.  Eyes: Conjunctivae are normal.  Neck: Normal range of motion. Neck supple.  Cardiovascular: Normal rate.   Pulmonary/Chest: Effort normal.  Musculoskeletal:Left forearm in splint.  Neurological: Alert and oriented to person, place, and time.  Skin: Skin is warm and dry.  Psychiatric: Normal mood and affect. Behavior is normal. Judgment and thought content  normal.         BP 120/80 (BP Location: Right Arm, Patient Position: Sitting)   Pulse 86   Temp 98 F (36.7 C) (Oral)   Wt 254 lb (115.2 kg)   SpO2 96%   BMI 44.99 kg/m  Wt Readings from Last 3 Encounters:  11/25/16 254 lb (115.2 kg)  11/13/16 258 lb 8 oz (117.3 kg)  11/10/16 248 lb (112.5 kg)    Assessment & Plan:  1. Vaginal irritation - patient self collected wet prep - WET PREP BY MOLECULAR PROBE - I will notify her of results, in meantime encouraged her to use mild soap for cleansing and to wear loose fitting, natural fibers  2. Anxiety and depression - patient did not have side effects when she tried venlafaxine in the past, I suspect she was not at a therapeutic level. She is willing to try again and we discussed that it may be necessary to increase dose several time.  - venlafaxine (EFFEXOR) 37.5 MG tablet; Take one tablet twice a day with meals x 1 week, then increase to 2 tablets twice a day with meals  Dispense: 98 tablet; Refill: 0  - she will follow up with her PCP in 4-6 weeks, sooner if symptoms worsen Clarene Reamer, FNP-BC  [facility], [place]  11/28/2016 8:00 AM

## 2016-11-26 LAB — WET PREP BY MOLECULAR PROBE
CANDIDA SPECIES: POSITIVE — AB
Gardnerella vaginalis: NEGATIVE
TRICHOMONAS VAG: NEGATIVE

## 2016-11-27 ENCOUNTER — Other Ambulatory Visit: Payer: Self-pay | Admitting: Family Medicine

## 2016-11-27 MED ORDER — FLUCONAZOLE 150 MG PO TABS: 150.0000 mg | ORAL_TABLET | Freq: Once | ORAL | 0 refills | Status: AC

## 2016-12-23 ENCOUNTER — Encounter: Payer: Self-pay | Admitting: Internal Medicine

## 2016-12-23 ENCOUNTER — Ambulatory Visit (INDEPENDENT_AMBULATORY_CARE_PROVIDER_SITE_OTHER): Payer: BLUE CROSS/BLUE SHIELD | Admitting: Internal Medicine

## 2016-12-23 VITALS — BP 120/76 | HR 74 | Temp 98.2°F | Ht 63.5 in | Wt 256.0 lb

## 2016-12-23 DIAGNOSIS — K219 Gastro-esophageal reflux disease without esophagitis: Secondary | ICD-10-CM

## 2016-12-23 DIAGNOSIS — F329 Major depressive disorder, single episode, unspecified: Secondary | ICD-10-CM | POA: Diagnosis not present

## 2016-12-23 DIAGNOSIS — R51 Headache: Secondary | ICD-10-CM

## 2016-12-23 DIAGNOSIS — M1990 Unspecified osteoarthritis, unspecified site: Secondary | ICD-10-CM | POA: Diagnosis not present

## 2016-12-23 DIAGNOSIS — R197 Diarrhea, unspecified: Secondary | ICD-10-CM

## 2016-12-23 DIAGNOSIS — Z0000 Encounter for general adult medical examination without abnormal findings: Secondary | ICD-10-CM

## 2016-12-23 DIAGNOSIS — R519 Headache, unspecified: Secondary | ICD-10-CM

## 2016-12-23 LAB — COMPREHENSIVE METABOLIC PANEL
ALBUMIN: 4.1 g/dL (ref 3.5–5.2)
ALK PHOS: 53 U/L (ref 39–117)
ALT: 17 U/L (ref 0–35)
AST: 16 U/L (ref 0–37)
BILIRUBIN TOTAL: 0.7 mg/dL (ref 0.2–1.2)
BUN: 11 mg/dL (ref 6–23)
CALCIUM: 9.3 mg/dL (ref 8.4–10.5)
CO2: 30 mEq/L (ref 19–32)
CREATININE: 0.76 mg/dL (ref 0.40–1.20)
Chloride: 104 mEq/L (ref 96–112)
GFR: 87.54 mL/min (ref >60.00)
Glucose, Bld: 94 mg/dL (ref 70–99)
Potassium: 4.1 mEq/L (ref 3.5–5.1)
Sodium: 137 mEq/L (ref 135–145)
Total Protein: 7 g/dL (ref 6.0–8.3)

## 2016-12-23 LAB — CBC
HCT: 43.2 % (ref 36.0–46.0)
Hemoglobin: 15.1 g/dL — ABNORMAL HIGH (ref 12.0–15.0)
MCHC: 35 g/dL (ref 30.0–36.0)
MCV: 92 fl (ref 78.0–100.0)
PLATELETS: 176 10*3/uL (ref 150.0–400.0)
RBC: 4.7 Mil/uL (ref 3.87–5.11)
RDW: 13.1 % (ref 11.5–15.5)
WBC: 6.1 10*3/uL (ref 4.0–10.5)

## 2016-12-23 LAB — LIPID PANEL
CHOL/HDL RATIO: 3
CHOLESTEROL: 169 mg/dL (ref 0–200)
HDL: 48.8 mg/dL (ref >39.00)
LDL Cholesterol: 106 mg/dL — ABNORMAL HIGH (ref 0–99)
NonHDL: 119.84
TRIGLYCERIDES: 69 mg/dL (ref 0.0–149.0)
VLDL: 13.8 mg/dL (ref 0.0–40.0)

## 2016-12-23 LAB — HEMOGLOBIN A1C: Hgb A1c MFr Bld: 5.2 % (ref 4.6–6.5)

## 2016-12-23 MED ORDER — AMITRIPTYLINE HCL 25 MG PO TABS: 25.0000 mg | ORAL_TABLET | Freq: Every day | ORAL | 5 refills | Status: DC

## 2016-12-23 MED ORDER — MELOXICAM 15 MG PO TABS: 15.0000 mg | ORAL_TABLET | Freq: Every day | ORAL | 5 refills | Status: DC

## 2016-12-23 MED ORDER — RANITIDINE HCL 150 MG PO TABS: 150.0000 mg | ORAL_TABLET | Freq: Every day | ORAL | 5 refills | Status: DC

## 2016-12-23 NOTE — Assessment & Plan Note (Signed)
Ongoing, never started the medication Stop Effexor eRx for Amitriptyline sent to pharmacy today She will talk to [facility] today and set up therapy appt  Follow up with me in 6 weeks via mychart

## 2016-12-23 NOTE — Assessment & Plan Note (Signed)
Will start Meloxicam, eRx sent to pharmacy Discussed no Ibuprofen or Aleve while taking this medication Discussed that it may make her reflux worse and she may need to take Zantac daily instead of prn

## 2016-12-23 NOTE — Assessment & Plan Note (Signed)
Stress related We will see if improves with Meloxicam eRx for Amitriptyline sent to pharmacy as well

## 2016-12-23 NOTE — Assessment & Plan Note (Signed)
Discussed avoiding triggers Discussed how weight loss may help improve her symptoms Continue Zantac, eRx sent to pharmacy per pt request

## 2016-12-23 NOTE — Patient Instructions (Signed)

## 2016-12-23 NOTE — Progress Notes (Signed)
Subjective:    Patient ID: [name], female    DOB: 08/09/1971, 45 y.o.   MRN: [id number]  HPI   Depression: Triggered by family difficulty. Her daughter's father killed himself. Her daughter has had multiple suicide attempts since that time. She was started on Effexor at her last visit, and referred to therapy as well, but reports she never picked up the medication or saw the therapist. She reports her depression is about the same, no better but no worse. She denies anxiety, SI/HI.  GERD: Triggered by tomato based foods. She take Zantac OTC as needed with good relief.   Arthritis: Mainly in her back and feet. She was referred to orthopedics at her last visit. He advised her to try some stretching exercises but she reports it hasn't helped.  Frequent Headaches: These are mainly tension headaches, stress related. They occur 2-3 times a week. She takes Fioricet as needed but reports it does not really provide any relief.  Flu: 09/2016 Tetanus: 01/2008 Pap Smear: 12/2012 normal Mammogram: 2014 at [facility] Screening: yearly Dentist: biannually  Diet: She does eat meat. She consumes fruits and veggies some days. She does eat fried foods. She drinks mostly sweet tea. Exercise: None  Review of Systems      Past Medical History:  Diagnosis Date  . Arthritis   . Depression   . Frequent headaches   . GERD (gastroesophageal reflux disease)     Current Outpatient Prescriptions  Medication Sig Dispense Refill  . acidophilus (RISAQUAD) CAPS capsule Take 1 capsule by mouth daily.    . butalbital-acetaminophen-caffeine (FIORICET, ESGIC) 50-325-40 MG tablet Take 1-2 tablets by mouth every 6 (six) hours as needed for headache. 20 tablet 0  . ranitidine (ZANTAC) 150 MG tablet Take 150 mg by mouth as needed for heartburn.    . venlafaxine (EFFEXOR) 37.5 MG tablet Take one tablet twice a day with meals x 1 week, then increase to 2 tablets twice a day with meals (Patient  not taking: Reported on 12/23/2016) 98 tablet 0   No current facility-administered medications for this visit.     No Known Allergies  Family History  Problem Relation Age of Onset  . Adopted: Yes    Social History   Social History  . Marital status: Single    Spouse name: N/A  . Number of children: N/A  . Years of education: N/A   Occupational History  . Not on file.   Social History Main Topics  . Smoking status: Current Some Day Smoker    Packs/day: 0.10    Types: Cigarettes  . Smokeless tobacco: Never Used     Comment: 1 cigarette every couple of weeks or so  . Alcohol use 0.0 oz/week     Comment: occasional  . Drug use: No  . Sexual activity: Yes    Birth control/ protection: IUD   Other Topics Concern  . Not on file   Social History Narrative  . No narrative on file     Constitutional: Pt reports fatigue. Denies fever, malaise, headache or abrupt weight changes.  HEENT: Denies eye pain, eye redness, ear pain, ringing in the ears, wax buildup, runny nose, nasal congestion, bloody nose, or sore throat. Respiratory: Denies difficulty breathing, shortness of breath, cough or sputum production.   Cardiovascular: Denies chest pain, chest tightness, palpitations or swelling in the hands or feet.  Gastrointestinal: Pt reports diarrhea. Denies abdominal pain, bloating, constipation, or blood in the stool.  GU:  Denies urgency, frequency, pain with urination, burning sensation, blood in urine, odor or discharge. Musculoskeletal: Pt reports back pain and pain in feet. Denies decrease in range of motion, difficulty with gait, muscle pain or joint swelling.  Skin: Denies redness, rashes, lesions or ulcercations.  Neurological: Denies dizziness, difficulty with memory, difficulty with speech or problems with balance and coordination.  Psych: Pt reports depression. Denies anxiety, SI/HI.  No other specific complaints in a complete review of systems (except as listed in HPI  above).  Objective:   Physical Exam   BP 120/76   Pulse 74   Temp 98.2 F (36.8 C) (Oral)   Ht 5' 3.5" (1.613 m)   Wt 256 lb (116.1 kg)   SpO2 97%   BMI 44.64 kg/m   Wt Readings from Last 3 Encounters:  11/25/16 254 lb (115.2 kg)  11/13/16 258 lb 8 oz (117.3 kg)  11/10/16 248 lb (112.5 kg)    General: Appears her stated age, obese in NAD. Skin: Warm, dry and intact.  HEENT: Head: normal shape and size; Eyes: sclera white, no icterus, conjunctiva pink, PERRLA and EOMs intact; Ears: Tm's gray and intact, normal light reflex; Throat/Mouth: Teeth present, mucosa pink and moist, no exudate, lesions or ulcerations noted.  Neck:  Neck supple, trachea midline. No masses, lumps or thyromegaly present.  Cardiovascular: Normal rate and rhythm. S1,S2 noted.  No murmur, rubs or gallops noted. No JVD or BLE edema.  Pulmonary/Chest: Normal effort and positive vesicular breath sounds. No respiratory distress. No wheezes, rales or ronchi noted.  Abdomen: Soft and nontender. Normal bowel sounds. No distention or masses noted. Liver, spleen and kidneys non palpable. Musculoskeletal: Her left wrist is in a splint. Strength 5/5 BUE/BLE. No difficulty with gait.  Neurological: Alert and oriented. Cranial nerves II-XII grossly intact. Coordination normal.  Psychiatric: Mood and affect flat. Behavior is normal. Judgment and thought content normal.     BMET    Component Value Date/Time   NA 137 09/07/2016 1813   K 4.3 09/07/2016 1813   CL 107 09/07/2016 1813   CO2 24 09/07/2016 1813   GLUCOSE 92 09/07/2016 1813   BUN 11 09/07/2016 1813   CREATININE 0.78 09/07/2016 1813   CALCIUM 9.3 09/07/2016 1813   GFRNONAA >60 09/07/2016 1813   GFRAA >60 09/07/2016 1813    Lipid Panel  No results found for: CHOL, TRIG, HDL, CHOLHDL, VLDL, LDLCALC  CBC    Component Value Date/Time   WBC 6.3 09/07/2016 1813   RBC 4.99 09/07/2016 1813   HGB 16.2 (H) 09/07/2016 1813   HCT 47.1 (H) 09/07/2016 1813    PLT 194 09/07/2016 1813   MCV 94.4 09/07/2016 1813   MCH 32.5 09/07/2016 1813   MCHC 34.4 09/07/2016 1813   RDW 12.9 09/07/2016 1813   LYMPHSABS 1.9 12/11/2008 1705   MONOABS 0.5 12/11/2008 1705   EOSABS 0.0 12/11/2008 1705   BASOSABS 0.1 12/11/2008 1705    Hgb A1C No results found for: HGBA1C        Assessment & Plan:   Preventative Health Maintenance:  Flu and tetanus UTD Pap smear UTD Mammogram ordered, she will call [facility] to schedule- number provided Encouraged her to consume a balanced diet and exercise regimen Advised her to see an eye doctor and dentist annually Will check CBC, CMET, Lipid and A1C today  Diarrhea:  ?IBS Try Probiotic daily for now  Follow up with me in 6 weeks via mychart, RTC in 1 year, sooner if needed [place] Maiolo,  Linsi Humann, NP

## 2017-06-21 ENCOUNTER — Encounter: Payer: Self-pay | Admitting: Family Medicine

## 2017-06-21 ENCOUNTER — Encounter: Payer: Self-pay | Admitting: Internal Medicine

## 2017-06-21 ENCOUNTER — Ambulatory Visit (INDEPENDENT_AMBULATORY_CARE_PROVIDER_SITE_OTHER): Payer: BLUE CROSS/BLUE SHIELD | Admitting: Family Medicine

## 2017-06-21 VITALS — BP 136/84 | HR 78 | Resp 20 | Ht 63.0 in | Wt 257.0 lb

## 2017-06-21 DIAGNOSIS — N920 Excessive and frequent menstruation with regular cycle: Secondary | ICD-10-CM | POA: Diagnosis not present

## 2017-06-21 DIAGNOSIS — Z30430 Encounter for insertion of intrauterine contraceptive device: Secondary | ICD-10-CM | POA: Diagnosis not present

## 2017-06-21 DIAGNOSIS — N8110 Cystocele, unspecified: Secondary | ICD-10-CM | POA: Diagnosis not present

## 2017-06-21 DIAGNOSIS — N946 Dysmenorrhea, unspecified: Secondary | ICD-10-CM | POA: Diagnosis not present

## 2017-06-21 MED ORDER — LEVONORGESTREL 20 MCG/24HR IU IUD
INTRAUTERINE_SYSTEM | Freq: Once | INTRAUTERINE | Status: AC
  Administered 2017-06-21: 16:00:00 via INTRAUTERINE

## 2017-06-21 NOTE — Progress Notes (Signed)
CLINIC ENCOUNTER NOTE  History:  46 y.o. [id number] here today for IUD removal/Reinsertion and to discuss vaginal bleeding and urinary incontinence. Reports vaginal bleeding for the first time since getting IUD.  Report associated LLQ pain for the past 2-3 days. Regarding u8rinary incontinence it has been progressively worsening and she preforms kegels regularly. She denies any abnormal vaginal discharge, pelvic pain or other concerns.   Past Medical History:  Diagnosis Date  . Arthritis   . Depression   . Frequent headaches   . GERD (gastroesophageal reflux disease)     Past Surgical History:  Procedure Laterality Date  . APPENDECTOMY    . CESAREAN SECTION    . TUBAL LIGATION    . WRIST SURGERY Left 11/21/2016   plates and screws inserted    The following portions of the patient's history were reviewed and updated as appropriate: allergies, current medications, past family history, past medical history, past social history, past surgical history and problem list.   Health Maintenance:  Normal pap and negative HRHPV on 11/2011- next in 2019.   Review of Systems:  Pertinent items noted in HPI and remainder of comprehensive ROS otherwise negative.   Objective:  Physical Exam BP 136/84   Pulse 78   Resp 20   Ht 5\' 3"  (1.6 m)   Wt 257 lb (116.6 kg)   LMP 06/19/2017   BMI 45.53 kg/m  CONSTITUTIONAL: Well-developed, well-nourished female in no acute distress.  HENT:  Normocephalic, atraumatic. External right and left ear normal. Oropharynx is clear and moist EYES: Conjunctivae and EOM are normal. Pupils are equal, round, and reactive to light. No scleral icterus.  NECK: Normal range of motion, supple, no masses SKIN: Skin is warm and dry. No rash noted. Not diaphoretic. No erythema. No pallor. [place]: Alert and oriented to person, place, and time. Normal reflexes, muscle tone coordination. No cranial nerve deficit noted. PSYCHIATRIC: Normal mood and affect. Normal behavior.  Normal judgment and thought content. CARDIOVASCULAR: Normal heart rate noted RESPIRATORY: Effort and breath sounds normal, no problems with respiration noted ABDOMEN: Soft, no distention noted.   PELVIC: Normal appearing external genitalia; normal appearing vaginal mucosa and cervix.  No abnormal discharge noted.  Normal uterine size, no other palpable masses, no uterine or adnexal tenderness. Posterior cervix, anterverted uterus. Bladder prolapse into vaginal canal.  MUSCULOSKELETAL: Normal range of motion. No edema noted.  Labs and Imaging No results found.   IUD Removal  Patient identified, informed consent performed, consent signed.  Patient was in the dorsal lithotomy position, normal external genitalia was noted.  A speculum was placed in the patient's vagina, normal discharge was noted, no lesions. The cervix was visualized, no lesions, no abnormal discharge.  The strings of the IUD were grasped and pulled using ring forceps. The IUD was removed in its entirety.   Patient tolerated the procedure well.    IUD Insertion Procedure Note Patient identified, informed consent performed, consent signed.   Discussed risks of irregular bleeding, cramping, infection, malpositioning or misplacement of the IUD outside the uterus which may require further procedure such as laparoscopy. Time out was performed.  Urine pregnancy test negative.  Speculum placed in the vagina.  Cervix visualized.  Cleaned with Betadine x 2.  Grasped anteriorly with a single tooth tenaculum.  Uterus sounded to 7 cm.  Mirena IUD placed per manufacturer's recommendations.  Strings trimmed to 3 cm. Tenaculum was removed, good hemostasis noted.  Patient tolerated procedure well.   Patient was given post-procedure instructions.  She was advised to have backup contraception for one week.  Patient was also asked to check IUD strings periodically and follow up in 4 weeks for IUD check.   Assessment & Plan:  1. Encounter for IUD  insertion Placed easily Will reevaluate in 4-6 weeks for bleeding if not improved after new IUD placement  2. Bladder prolapse, female, acquired Referral to see Dr. Hulan Fray  3. Dysmenorrhea - IUD placed  4. Menorrhagia with regular cycle - IUD placed   Routine preventative health maintenance measures emphasized. Please refer to After Visit Summary for other counseling recommendations.   Return in about 4 weeks (around 07/19/2017) for with [facility] for bladder prolapse.   Total face-to-face time with patient: 30 minutes. Over 50% of encounter was spent on counseling and coordination of care.

## 2017-06-21 NOTE — Patient Instructions (Signed)
Pelvic Organ Prolapse Pelvic organ prolapse is the stretching, bulging, or dropping of pelvic organs into an abnormal position. It happens when the muscles and tissues that surround and support pelvic structures are stretched or weak. Pelvic organ prolapse can involve:  Vagina (vaginal prolapse).  Uterus (uterine prolapse).  Bladder (cystocele).  Rectum (rectocele).  Intestines (enterocele).  When organs other than the vagina are involved, they often bulge into the vagina or protrude from the vagina, depending on how severe the prolapse is. What are the causes? Causes of this condition include:  Pregnancy, labor, and childbirth.  Long-lasting (chronic) cough.  Chronic constipation.  Obesity.  Past pelvic surgery.  Aging. During and after menopause, a decreased production of the hormone estrogen can weaken pelvic ligaments and muscles.  Consistently lifting more than 50 lb (23 kg).  Buildup of fluid in the abdomen due to certain diseases and other conditions.  What are the signs or symptoms? Symptoms of this condition include:  Loss of bladder control when you cough, sneeze, strain, and exercise (stress incontinence). This may be worse immediately following childbirth, and it may gradually improve over time.  Feeling pressure in your pelvis or vagina. This pressure may increase when you cough or when you are having a bowel movement.  A bulge that protrudes from the opening of your vagina or against your vaginal wall. If your uterus protrudes through the opening of your vagina and rubs against your clothing, you may also experience soreness, ulcers, infection, pain, and bleeding.  Increased effort to have a bowel movement or urinate.  Pain in your low back.  Pain, discomfort, or disinterest in sexual intercourse.  Repeated bladder infections (urinary tract infections).  Difficulty inserting or inability to insert a tampon or applicator.  In some people, this  condition does not cause any symptoms. How is this diagnosed? Your health care provider may perform an internal and external vaginal and rectal exam. During the exam, you may be asked to cough and strain while you are lying down, sitting, and standing up. Your health care provider will determine if other tests are required, such as bladder function tests. How is this treated? In most cases, this condition needs to be treated only if it produces symptoms. No treatment is guaranteed to correct the prolapse or relieve the symptoms completely. Treatment may include:  Lifestyle changes, such as: ? Avoiding drinking beverages that contain caffeine. ? Increasing your intake of high-fiber foods. This can help to decrease constipation and straining during bowel movements. ? Emptying your bladder at scheduled times (bladder training therapy). This can help to reduce or avoid urinary incontinence. ? Losing weight if you are overweight or obese.  Estrogen. Estrogen may help mild prolapse by increasing the strength and tone of pelvic floor muscles.  Kegel exercises. These may help mild cases of prolapse by strengthening and tightening the muscles of the pelvic floor.  Pessary insertion. A pessary is a soft, flexible device that is placed into your vagina by your health care provider to help support the vaginal walls and keep pelvic organs in place.  Surgery. This is often the only form of treatment for severe prolapse. Different types of surgeries are available.  Follow these instructions at home:  Wear a sanitary pad or absorbent product if you have urinary incontinence.  Avoid heavy lifting and straining with exercise and work. Do not hold your breath when you perform mild to moderate lifting and exercise activities. Limit your activities as directed by your health care  provider.  Take medicines only as directed by your health care provider.  Perform Kegel exercises as directed by your health care  provider.  If you have a pessary, take care of it as directed by your health care provider. Contact a health care provider if:  Your symptoms interfere with your daily activities or sex life.  You need medicine to help with the discomfort.  You notice bleeding from the vagina that is not related to your period.  You have a fever.  You have pain or bleeding when you urinate.  You have bleeding when you have a bowel movement.  You lose urine when you have sex.  You have chronic constipation.  You have a pessary that falls out.  You have vaginal discharge that has a bad smell.  You have low abdominal pain or cramping that is unusual for you. This information is not intended to replace advice given to you by your health care provider. Make sure you discuss any questions you have with your health care provider. Document Released: 07/09/2014 Document Revised: 05/19/2016 Document Reviewed: 02/24/2014 Elsevier Interactive Patient Education  [employer].

## 2017-07-01 ENCOUNTER — Emergency Department (HOSPITAL_COMMUNITY): Payer: BLUE CROSS/BLUE SHIELD

## 2017-07-01 ENCOUNTER — Encounter (HOSPITAL_COMMUNITY): Payer: Self-pay

## 2017-07-01 ENCOUNTER — Emergency Department (HOSPITAL_COMMUNITY)
Admission: EM | Admit: 2017-07-01 | Discharge: 2017-07-01 | Disposition: A | Discharge status: Home / Self Care | Payer: BLUE CROSS/BLUE SHIELD | Attending: Emergency Medicine | Admitting: Emergency Medicine

## 2017-07-01 DIAGNOSIS — Z87891 Personal history of nicotine dependence: Secondary | ICD-10-CM | POA: Diagnosis not present

## 2017-07-01 DIAGNOSIS — R1011 Right upper quadrant pain: Secondary | ICD-10-CM | POA: Diagnosis present

## 2017-07-01 DIAGNOSIS — R110 Nausea: Secondary | ICD-10-CM | POA: Diagnosis not present

## 2017-07-01 DIAGNOSIS — N342 Other urethritis: Secondary | ICD-10-CM | POA: Insufficient documentation

## 2017-07-01 LAB — COMPREHENSIVE METABOLIC PANEL
ALT: 17 U/L (ref 14–54)
ANION GAP: 6 (ref 5–15)
AST: 26 U/L (ref 15–41)
Albumin: 3.6 g/dL (ref 3.5–5.0)
Alkaline Phosphatase: 49 U/L (ref 38–126)
BILIRUBIN TOTAL: 0.6 mg/dL (ref 0.3–1.2)
BUN: 10 mg/dL (ref 6–20)
CALCIUM: 8.7 mg/dL — AB (ref 8.9–10.3)
CO2: 25 mmol/L (ref 22–32)
Chloride: 106 mmol/L (ref 101–111)
Creatinine, Ser: 0.79 mg/dL (ref 0.44–1.00)
GFR calc non Af Amer: >60 mL/min (ref >60)
Glucose, Bld: 100 mg/dL — ABNORMAL HIGH (ref 65–99)
Potassium: 4.5 mmol/L (ref 3.5–5.1)
Sodium: 137 mmol/L (ref 135–145)
TOTAL PROTEIN: 6.7 g/dL (ref 6.5–8.1)

## 2017-07-01 LAB — CBC WITH DIFFERENTIAL/PLATELET
BASOS ABS: 0 10*3/uL (ref 0.0–0.1)
BASOS PCT: 1 %
EOS ABS: 0.1 10*3/uL (ref 0.0–0.7)
Eosinophils Relative: 2 %
HEMATOCRIT: 44.3 % (ref 36.0–46.0)
HEMOGLOBIN: 15.2 g/dL — AB (ref 12.0–15.0)
Lymphocytes Relative: 32 %
Lymphs Abs: 2.1 10*3/uL (ref 0.7–4.0)
MCH: 32.8 pg (ref 26.0–34.0)
MCHC: 34.3 g/dL (ref 30.0–36.0)
MCV: 95.7 fL (ref 78.0–100.0)
MONO ABS: 0.5 10*3/uL (ref 0.1–1.0)
Monocytes Relative: 7 %
NEUTROS ABS: 3.8 10*3/uL (ref 1.7–7.7)
NEUTROS PCT: 58 %
Platelets: 191 10*3/uL (ref 150–400)
RBC: 4.63 MIL/uL (ref 3.87–5.11)
RDW: 13.5 % (ref 11.5–15.5)
WBC: 6.5 10*3/uL (ref 4.0–10.5)

## 2017-07-01 LAB — URINALYSIS, ROUTINE W REFLEX MICROSCOPIC
BILIRUBIN URINE: NEGATIVE
Glucose, UA: NEGATIVE mg/dL
Ketones, ur: NEGATIVE mg/dL
Nitrite: NEGATIVE
PH: 5 (ref 5.0–8.0)
Protein, ur: 30 mg/dL — AB
SPECIFIC GRAVITY, URINE: 1.013 (ref 1.005–1.030)

## 2017-07-01 LAB — TROPONIN I

## 2017-07-01 LAB — I-STAT BETA HCG BLOOD, ED (MC, WL, AP ONLY)

## 2017-07-01 LAB — LIPASE, BLOOD: Lipase: 14 U/L (ref 11–51)

## 2017-07-01 MED ORDER — FLUCONAZOLE 150 MG PO TABS
150.0000 mg | ORAL_TABLET | Freq: Once | ORAL | Status: AC
  Administered 2017-07-01: 150 mg via ORAL
  Filled 2017-07-01: qty 1

## 2017-07-01 MED ORDER — MORPHINE SULFATE (PF) 2 MG/ML IV SOLN
4.0000 mg | Freq: Once | INTRAVENOUS | Status: AC
  Administered 2017-07-01: 4 mg via INTRAVENOUS
  Filled 2017-07-01: qty 2

## 2017-07-01 MED ORDER — SODIUM CHLORIDE 0.9 % IV BOLUS (SEPSIS)
1000.0000 mL | Freq: Once | INTRAVENOUS | Status: AC
  Administered 2017-07-01: 1000 mL via INTRAVENOUS

## 2017-07-01 MED ORDER — FLUCONAZOLE 150 MG PO TABS: 150.0000 mg | ORAL_TABLET | Freq: Once | ORAL | 0 refills | Status: AC

## 2017-07-01 MED ORDER — ESOMEPRAZOLE MAGNESIUM 40 MG PO CPDR: 40.0000 mg | DELAYED_RELEASE_CAPSULE | Freq: Every day | ORAL | 0 refills | Status: DC

## 2017-07-01 MED ORDER — CEPHALEXIN 500 MG PO CAPS: 500.0000 mg | ORAL_CAPSULE | Freq: Three times a day (TID) | ORAL | 0 refills | Status: AC

## 2017-07-01 MED ORDER — DEXTROSE 5 % IV SOLN
1.0000 g | Freq: Once | INTRAVENOUS | Status: AC
  Administered 2017-07-01: 1 g via INTRAVENOUS
  Filled 2017-07-01: qty 10

## 2017-07-01 MED ORDER — ONDANSETRON HCL 4 MG/2ML IJ SOLN
4.0000 mg | Freq: Once | INTRAMUSCULAR | Status: AC
  Administered 2017-07-01: 4 mg via INTRAVENOUS
  Filled 2017-07-01: qty 2

## 2017-07-01 NOTE — Discharge Instructions (Signed)
Take nexium daily.   Take keflex three times daily for 7 days.   Take diflucan if you have a yeast infection.   Stay hydrated.   See your doctor   Avoid spicy food or fatty food   Return to ER if you have worse abdominal pain, vomiting, fever, flank pain.

## 2017-07-01 NOTE — ED Provider Notes (Signed)
[facility] DEPT Provider Note   CSN: [id number] Arrival date & time: 07/01/17  0709     History   Chief Complaint Chief Complaint  Patient presents with  . Abdominal Pain    HPI PHOENYX MELKA is a 46 y.o. female hx of depression, GERD, here with RUQ pain. Patient states that she woke around 4 AM with right upper quadrant pain. Feeling nauseated but no vomiting. Denies any dysuria or hematuria. Patient states that she did eat a hamburger last night. She states that she had gallstones diagnosed about 20 years ago but never had a cholecystectomy. Denies any fevers or chills.     The history is provided by the patient.    Past Medical History:  Diagnosis Date  . Arthritis   . Depression   . Frequent headaches   . GERD (gastroesophageal reflux disease)     Patient Active Problem List   Diagnosis Date Noted  . Dysmenorrhea 06/21/2017  . Menorrhagia 06/21/2017  . Depression 01/20/2016  . Esophageal reflux 01/20/2016  . Osteoarthritis, multiple sites 01/20/2016  . Frequent headaches 01/20/2016    Past Surgical History:  Procedure Laterality Date  . APPENDECTOMY    . CESAREAN SECTION    . TUBAL LIGATION    . WRIST SURGERY Left 11/21/2016   plates and screws inserted    OB History    Gravida Para Term Preterm AB Living   5 4 4   1 4    SAB TAB Ectopic Multiple Live Births     1     4       Home Medications    Prior to Admission medications   Medication Sig Start Date End Date Taking? Authorizing Provider  acidophilus (RISAQUAD) CAPS capsule Take 1 capsule by mouth daily.   Yes [provider]  amitriptyline (ELAVIL) 25 MG tablet Take 1 tablet (25 mg total) by mouth at bedtime. 12/23/16  Yes Baity, Coralie Keens, NP  butalbital-acetaminophen-caffeine (FIORICET, ESGIC) [phone] MG tablet Take 1-2 tablets by mouth every 6 (six) hours as needed for headache. 09/07/16 09/07/17 Yes Sam, Olivia Canter, PA-C  meloxicam (MOBIC) 15 MG tablet Take 1 tablet (15 mg total)  by mouth daily. 12/23/16  Yes Jearld Fenton, NP  ranitidine (ZANTAC) 150 MG tablet Take 1 tablet (150 mg total) by mouth at bedtime. 12/23/16  Yes Jearld Fenton, NP    Family History Family History  Problem Relation Age of Onset  . Adopted: Yes    Social History Social History  Substance Use Topics  . Smoking status: Former Smoker    Packs/day: 0.10    Types: Cigarettes  . Smokeless tobacco: Never Used     Comment: recently quit  . Alcohol use 0.0 oz/week     Comment: occasional     Allergies   Patient has no known allergies.   Review of Systems Review of Systems  Gastrointestinal: Positive for abdominal pain.  All other systems reviewed and are negative.    Physical Exam Updated Vital Signs BP 127/81 (BP Location: Right Arm)   Pulse 62   Temp 98.9 F (37.2 C)   Resp 17   Ht 5\' 3"  (1.6 m)   Wt 116.6 kg (257 lb)   LMP 06/19/2017   SpO2 98%   BMI 45.53 kg/m   Physical Exam  Constitutional: She is oriented to person, place, and time. She appears well-nourished.  Uncomfortable   HENT:  Head: Normocephalic.  Mouth/Throat: Oropharynx is clear and moist.  Eyes:  Conjunctivae and EOM are normal. Pupils are equal, round, and reactive to light.  Neck: Normal range of motion. Neck supple.  Cardiovascular: Normal rate, regular rhythm and normal heart sounds.   Pulmonary/Chest: Effort normal and breath sounds normal. No respiratory distress. She has no wheezes.  Abdominal: Soft.  + RUQ tenderness, mild murphy's. No CVAT   Musculoskeletal: Normal range of motion.  Neurological: She is alert and oriented to person, place, and time.  Skin: Skin is warm.  Psychiatric: She has a normal mood and affect.  Nursing note and vitals reviewed.    ED Treatments / Results  Labs (all labs ordered are listed, but only abnormal results are displayed) Labs Reviewed  COMPREHENSIVE METABOLIC PANEL - Abnormal; Notable for the following:       Result Value   Glucose, Bld 100  (*)    Calcium 8.7 (*)    All other components within normal limits  CBC WITH DIFFERENTIAL/PLATELET - Abnormal; Notable for the following:    Hemoglobin 15.2 (*)    All other components within normal limits  URINALYSIS, ROUTINE W REFLEX MICROSCOPIC - Abnormal; Notable for the following:    APPearance CLOUDY (*)    Hgb urine dipstick SMALL (*)    Protein, ur 30 (*)    Leukocytes, UA SMALL (*)    Bacteria, UA MANY (*)    Squamous Epithelial / LPF 6-30 (*)    All other components within normal limits  URINE CULTURE  LIPASE, BLOOD  TROPONIN I  I-STAT BETA HCG BLOOD, ED ([facility], [facility], [facility] ONLY)    EKG  EKG Interpretation  Date/Time:  Saturday July 01 2017 09:50:13 EDT Ventricular Rate:  63 PR Interval:    QRS Duration: 80 QT Interval:  432 QTC Calculation: 443 R Axis:   68 Text Interpretation:  Sinus rhythm Low voltage, extremity and precordial leads No significant change since last tracing Confirmed by Wandra Arthurs [phone]) on 07/01/2017 9:52:55 AM       Radiology Dg Chest 2 View  Result Date: 07/01/2017 CLINICAL DATA:  Mid right chest pain.  Shortness of breath. EXAM: CHEST  2 VIEW COMPARISON:  None. FINDINGS: The heart size and mediastinal contours are within normal limits. Both lungs are clear. The visualized skeletal structures are unremarkable. IMPRESSION: No active cardiopulmonary disease. Electronically Signed   By: Dorise Bullion III M.D   On: 07/01/2017 10:29   US Abdomen Limited Ruq  Result Date: 07/01/2017 CLINICAL DATA:  Right upper quadrant abdominal pain. EXAM: ULTRASOUND ABDOMEN LIMITED RIGHT UPPER QUADRANT COMPARISON:  Abdominal ultrasound on 12/11/2008 FINDINGS: Gallbladder: No gallstones or wall thickening visualized. No sonographic Murphy sign noted by sonographer. Common bile duct: Diameter: Normal caliber of 4 mm. Liver: No focal lesion identified. Within normal limits in parenchymal echogenicity. The portal vein is patent and demonstrates hepatopetal flow. IMPRESSION:  Normal right upper quadrant abdominal ultrasound. Electronically Signed   By: Aletta Edouard M.D.   On: 07/01/2017 09:41    Procedures Procedures (including critical care time)  Medications Ordered in ED Medications  sodium chloride 0.9 % bolus 1,000 mL (0 mLs Intravenous Stopped 07/01/17 1046)  morphine 2 MG/ML injection 4 mg (4 mg Intravenous Given 07/01/17 0853)  ondansetron (ZOFRAN) injection 4 mg (4 mg Intravenous Given 07/01/17 0853)  cefTRIAXone (ROCEPHIN) 1 g in dextrose 5 % 50 mL IVPB (1 g Intravenous New Bag/Given 07/01/17 1305)  fluconazole (DIFLUCAN) tablet 150 mg (150 mg Oral Given 07/01/17 1318)     Initial Impression / Assessment  and Plan / ED Course  I have reviewed the triage vital signs and the nursing notes.  Pertinent labs & imaging results that were available during my care of the patient were reviewed by me and considered in my medical decision making (see chart for details).    NYJAH SCHWAKE is a 46 y.o. female here with RUQ pain. Likely biliary colic vs pancreatitis vs reflux. Will get labs, CMP, lipase, RUQ [place]. Will give pain meds and reassess.   2:04 PM RUQ US unremarkable. No gallstones. LFTs nl. Lipase nl. UA + UTI. Given rocephin. Will dc home with keflex for possible pyelo, nexium for possible reflux.    Final Clinical Impressions(s) / ED Diagnoses   Final diagnoses:  RUQ pain    New Prescriptions New Prescriptions   No medications on file     Drenda Freeze, MD 07/01/17 [phone]

## 2017-07-01 NOTE — ED Triage Notes (Signed)
Pt woke up at 4 am with rt upper abdominal pain.  No n/v.  No difficulty or changes in urination.

## 2017-07-03 LAB — URINE CULTURE

## 2017-07-18 ENCOUNTER — Encounter: Payer: Self-pay | Admitting: Obstetrics & Gynecology

## 2017-07-18 ENCOUNTER — Ambulatory Visit (INDEPENDENT_AMBULATORY_CARE_PROVIDER_SITE_OTHER): Payer: BLUE CROSS/BLUE SHIELD | Admitting: Obstetrics & Gynecology

## 2017-07-18 VITALS — BP 128/83 | HR 80 | Ht 63.0 in | Wt 262.0 lb

## 2017-07-18 DIAGNOSIS — N3946 Mixed incontinence: Secondary | ICD-10-CM | POA: Diagnosis not present

## 2017-07-18 DIAGNOSIS — Z01419 Encounter for gynecological examination (general) (routine) without abnormal findings: Secondary | ICD-10-CM

## 2017-07-18 DIAGNOSIS — Z0000 Encounter for general adult medical examination without abnormal findings: Secondary | ICD-10-CM

## 2017-07-18 NOTE — Progress Notes (Signed)
   Subjective:    Patient ID: [name], female    DOB: Nov 11, 1971, 46 y.o.   MRN: [id number]  HPI 46 yo SW P4 (3 vaginal del's and 1 c/s) here with the issue of worsening mixed incontinence. She noticed this about 2 years ago and a doctor recommended Kegel exercises. This has gotten worse especially in the last month. She has to wear pads now. She has GSUI, urge incontinence, sometimes voids with sex, and has had nocturia.    Review of Systems Monogamous for 45 years, lives together    Objective:   Physical Exam  [id number] Breathing, conversing, and ambulating normally Minimal/1st degree cystocele NO uterine prolapse Long spec required to reach cervix Pap smear obtained      Assessment & Plan:  Preventative care- pap smear with cotesting Mammogram Complicated mixed incontinence- refer to urology

## 2017-07-21 LAB — CYTOLOGY - PAP
DIAGNOSIS: NEGATIVE
HPV: DETECTED — AB

## 2017-07-25 ENCOUNTER — Encounter: Payer: Self-pay | Admitting: General Practice

## 2017-07-25 ENCOUNTER — Telehealth: Payer: Self-pay | Admitting: General Practice

## 2017-07-25 NOTE — Telephone Encounter (Signed)
Called patient, no answer- left message stating we are trying to you reach you with results. I will send you a mychart message with the information.

## 2017-07-27 ENCOUNTER — Emergency Department (HOSPITAL_COMMUNITY): Payer: BLUE CROSS/BLUE SHIELD

## 2017-07-27 ENCOUNTER — Emergency Department (HOSPITAL_COMMUNITY)
Admission: EM | Admit: 2017-07-27 | Discharge: 2017-07-27 | Disposition: A | Discharge status: Home / Self Care | Payer: BLUE CROSS/BLUE SHIELD | Attending: Emergency Medicine | Admitting: Emergency Medicine

## 2017-07-27 ENCOUNTER — Encounter (HOSPITAL_COMMUNITY): Payer: Self-pay | Admitting: Emergency Medicine

## 2017-07-27 DIAGNOSIS — Z87891 Personal history of nicotine dependence: Secondary | ICD-10-CM | POA: Diagnosis not present

## 2017-07-27 DIAGNOSIS — K2970 Gastritis, unspecified, without bleeding: Secondary | ICD-10-CM | POA: Insufficient documentation

## 2017-07-27 DIAGNOSIS — Z79899 Other long term (current) drug therapy: Secondary | ICD-10-CM | POA: Insufficient documentation

## 2017-07-27 DIAGNOSIS — K2900 Acute gastritis without bleeding: Secondary | ICD-10-CM

## 2017-07-27 DIAGNOSIS — R1013 Epigastric pain: Secondary | ICD-10-CM | POA: Diagnosis present

## 2017-07-27 LAB — I-STAT CHEM 8, ED
BUN: 13 mg/dL (ref 6–20)
CALCIUM ION: 1.11 mmol/L — AB (ref 1.15–1.40)
CHLORIDE: 106 mmol/L (ref 101–111)
Creatinine, Ser: 0.8 mg/dL (ref 0.44–1.00)
GLUCOSE: 163 mg/dL — AB (ref 65–99)
HCT: 44 % (ref 36.0–46.0)
Hemoglobin: 15 g/dL (ref 12.0–15.0)
POTASSIUM: 3.6 mmol/L (ref 3.5–5.1)
Sodium: 140 mmol/L (ref 135–145)
TCO2: 22 mmol/L (ref 0–100)

## 2017-07-27 LAB — CBC WITH DIFFERENTIAL/PLATELET
BASOS ABS: 0 10*3/uL (ref 0.0–0.1)
Basophils Relative: 0 %
EOS PCT: 1 %
Eosinophils Absolute: 0.1 10*3/uL (ref 0.0–0.7)
HEMATOCRIT: 44.3 % (ref 36.0–46.0)
HEMOGLOBIN: 15.3 g/dL — AB (ref 12.0–15.0)
LYMPHS ABS: 1.5 10*3/uL (ref 0.7–4.0)
LYMPHS PCT: 14 %
MCH: 32.3 pg (ref 26.0–34.0)
MCHC: 34.5 g/dL (ref 30.0–36.0)
MCV: 93.7 fL (ref 78.0–100.0)
MONO ABS: 0.5 10*3/uL (ref 0.1–1.0)
MONOS PCT: 5 %
Neutro Abs: 8.1 10*3/uL — ABNORMAL HIGH (ref 1.7–7.7)
Neutrophils Relative %: 80 %
PLATELETS: 179 10*3/uL (ref 150–400)
RBC: 4.73 MIL/uL (ref 3.87–5.11)
RDW: 12.5 % (ref 11.5–15.5)
WBC: 10.1 10*3/uL (ref 4.0–10.5)

## 2017-07-27 LAB — I-STAT TROPONIN, ED
Troponin i, poc: 0.03 ng/mL (ref 0.00–0.08)
Troponin i, poc: 0 ng/mL (ref 0.00–0.08)

## 2017-07-27 LAB — HEPATIC FUNCTION PANEL
ALBUMIN: 3.7 g/dL (ref 3.5–5.0)
ALT: 25 U/L (ref 14–54)
AST: 49 U/L — AB (ref 15–41)
Alkaline Phosphatase: 61 U/L (ref 38–126)
BILIRUBIN TOTAL: 0.6 mg/dL (ref 0.3–1.2)
Bilirubin, Direct: 0.2 mg/dL (ref 0.1–0.5)
Indirect Bilirubin: 0.4 mg/dL (ref 0.3–0.9)
Total Protein: 6.3 g/dL — ABNORMAL LOW (ref 6.5–8.1)

## 2017-07-27 LAB — I-STAT BETA HCG BLOOD, ED (MC, WL, AP ONLY)

## 2017-07-27 LAB — LIPASE, BLOOD: LIPASE: 29 U/L (ref 11–51)

## 2017-07-27 MED ORDER — POLYETHYLENE GLYCOL 3350 17 G PO PACK: 17.0000 g | PACK | Freq: Every day | ORAL | 0 refills | Status: DC

## 2017-07-27 MED ORDER — ONDANSETRON HCL 4 MG/2ML IJ SOLN
4.0000 mg | Freq: Once | INTRAMUSCULAR | Status: AC
  Administered 2017-07-27: 4 mg via INTRAVENOUS
  Filled 2017-07-27: qty 2

## 2017-07-27 MED ORDER — GI COCKTAIL ~~LOC~~
30.0000 mL | Freq: Once | ORAL | Status: AC
  Administered 2017-07-27: 30 mL via ORAL
  Filled 2017-07-27: qty 30

## 2017-07-27 MED ORDER — DICYCLOMINE HCL 10 MG/ML IM SOLN
20.0000 mg | Freq: Once | INTRAMUSCULAR | Status: AC
  Administered 2017-07-27: 20 mg via INTRAMUSCULAR
  Filled 2017-07-27: qty 2

## 2017-07-27 MED ORDER — ONDANSETRON 8 MG PO TBDP: ORAL_TABLET | ORAL | 0 refills | Status: DC

## 2017-07-27 MED ORDER — OMEPRAZOLE 20 MG PO CPDR: 20.0000 mg | DELAYED_RELEASE_CAPSULE | Freq: Every day | ORAL | 0 refills | Status: DC

## 2017-07-27 MED ORDER — METOCLOPRAMIDE HCL 5 MG/ML IJ SOLN
10.0000 mg | Freq: Once | INTRAMUSCULAR | Status: AC
  Administered 2017-07-27: 10 mg via INTRAVENOUS
  Filled 2017-07-27: qty 2

## 2017-07-27 NOTE — ED Provider Notes (Signed)
[name] DEPT Provider Note   CSN: [id number] Arrival date & time: 07/27/17  0119  By signing my name below, I, Marcello Moores, attest that this documentation has been prepared under the direction and in the presence of [place], [place]serrath Madding, MD. Electronically Signed: Marcello Moores, ED Scribe. 07/27/17. 2:21 AM.  History   Chief Complaint Chief Complaint  Patient presents with  . Chest Pain  . Abdominal Pain   The history is provided by the patient. No language interpreter was used.  Abdominal Pain   This is a recurrent problem. The current episode started 3 to 5 hours ago. The problem occurs constantly. The problem has not changed since onset.The pain is associated with eating. The pain is located in the epigastric region. The quality of the pain is dull. The pain is moderate. Associated symptoms include nausea. Pertinent negatives include diarrhea, vomiting and constipation. Nothing aggravates the symptoms. Nothing relieves the symptoms. Past workup includes ultrasound. Past workup does not include GI consult.   HPI Comments: [name] is a 46 y.o. female BIB EMS, who presents to the Emergency Department complaining of central 5/10 chest pain with associated nausea and diaphoresis that began at 11:00pm yesterday. She states that her pain radiates to RUQ of abdomen.The pt states that she originally took some nexium because she originally thought her symptoms were related to GERD. She states that she had similar pan 18 years ago where she had gallstones, where her gallbladder was not removed. The pt states that her last meal included a sandwich, fries, and tea. The pt denies SOB, constipation, diarrhea, and vomiting.  Past Medical History:  Diagnosis Date  . Arthritis   . Depression   . Frequent headaches   . GERD (gastroesophageal reflux disease)     Patient Active Problem List   Diagnosis Date Noted  . Dysmenorrhea 06/21/2017  . Menorrhagia 06/21/2017  . Depression  01/20/2016  . Esophageal reflux 01/20/2016  . Osteoarthritis, multiple sites 01/20/2016  . Frequent headaches 01/20/2016    Past Surgical History:  Procedure Laterality Date  . APPENDECTOMY    . CESAREAN SECTION    . TUBAL LIGATION    . WRIST SURGERY Left 11/21/2016   plates and screws inserted    OB History    Gravida Para Term Preterm AB Living   5 4 4   1 4    SAB TAB Ectopic Multiple Live Births     1     4       Home Medications    Prior to Admission medications   Medication Sig Start Date End Date Taking? Authorizing Provider  acidophilus (RISAQUAD) CAPS capsule Take 1 capsule by mouth daily.    [provider]  amitriptyline (ELAVIL) 25 MG tablet Take 1 tablet (25 mg total) by mouth at bedtime. 12/23/16   Jearld Fenton, NP  butalbital-acetaminophen-caffeine (FIORICET, ESGIC) [phone] MG tablet Take 1-2 tablets by mouth every 6 (six) hours as needed for headache. 09/07/16 09/07/17  Sam, Olivia Canter, PA-C  esomeprazole (NEXIUM) 40 MG capsule Take 1 capsule (40 mg total) by mouth daily. 07/01/17   Drenda Freeze, MD  levonorgestrel (MIRENA) 20 MCG/24HR IUD 1 each by Intrauterine route once.    [provider]  meloxicam (MOBIC) 15 MG tablet Take 1 tablet (15 mg total) by mouth daily. 12/23/16   Jearld Fenton, NP  ranitidine (ZANTAC) 150 MG tablet Take 1 tablet (150 mg total) by mouth at bedtime. 12/23/16   Jearld Fenton, NP  Family History Family History  Problem Relation Age of Onset  . Adopted: Yes    Social History Social History  Substance Use Topics  . Smoking status: Former Smoker    Packs/day: 0.10    Types: Cigarettes  . Smokeless tobacco: Never Used     Comment: recently quit  . Alcohol use 0.0 oz/week     Comment: occasional     Allergies   Patient has no known allergies.   Review of Systems Review of Systems  Constitutional: Negative for diaphoresis.  Respiratory: Positive for chest tightness. Negative for shortness  of breath.   Cardiovascular: Positive for chest pain. Negative for palpitations and leg swelling.  Gastrointestinal: Positive for abdominal pain and nausea. Negative for constipation, diarrhea and vomiting.  All other systems reviewed and are negative.    Physical Exam Updated Vital Signs BP 119/74 (BP Location: Right Arm)   Pulse 78   Temp 98 F (36.7 C) (Oral)   Resp 15   Ht 5\' 3"  (1.6 m)   Wt 260 lb (117.9 kg)   SpO2 100%   BMI 46.06 kg/m   Physical Exam  Constitutional: She is oriented to person, place, and time. She appears well-developed and well-nourished. No distress.  HENT:  Head: Normocephalic and atraumatic.  Mouth/Throat: No oropharyngeal exudate.  Eyes: Pupils are equal, round, and reactive to light. Conjunctivae and EOM are normal.  Neck: Normal range of motion. No JVD present.  Cardiovascular: Normal rate, regular rhythm and normal heart sounds.  Exam reveals no gallop and no friction rub.   No murmur heard. No JVD. No bruits.  Pulmonary/Chest: Effort normal and breath sounds normal. No stridor. No respiratory distress. She has no wheezes. She has no rales.  Abdominal: Soft. She exhibits no distension and no mass. There is no tenderness. There is no rebound, no guarding, no tenderness at McBurney's point and negative Murphy's sign.  Mildly gassy.  Musculoskeletal: Normal range of motion.  Neurological: She is alert and oriented to person, place, and time.  Skin: She is not diaphoretic.  Psychiatric: She has a normal mood and affect.  Nursing note and vitals reviewed.    ED Treatments / Results   Vitals:   07/27/17 0330 07/27/17 0345  BP: 131/83 (!) 105/53  Pulse: 80 72  Resp: (!) 21 (!) 24  Temp:      DIAGNOSTIC STUDIES: Oxygen Saturation is 100% on RA, normal by my interpretation.   COORDINATION OF CARE: 1:28 AM-Discussed next steps with pt. Pt verbalized understanding and is agreeable with the plan.   Labs (all labs ordered are listed, but  only abnormal results are displayed)  Results for orders placed or performed during the hospital encounter of 07/27/17  CBC with Differential/Platelet  Result Value Ref Range   WBC 10.1 4.0 - 10.5 K/uL   RBC 4.73 3.87 - 5.11 MIL/uL   Hemoglobin 15.3 (H) 12.0 - 15.0 g/dL   HCT 44.3 36.0 - 46.0 %   MCV 93.7 78.0 - 100.0 fL   MCH 32.3 26.0 - 34.0 pg   MCHC 34.5 30.0 - 36.0 g/dL   RDW 12.5 11.5 - 15.5 %   Platelets 179 150 - 400 K/uL   Neutrophils Relative % 80 %   Neutro Abs 8.1 (H) 1.7 - 7.7 K/uL   Lymphocytes Relative 14 %   Lymphs Abs 1.5 0.7 - 4.0 K/uL   Monocytes Relative 5 %   Monocytes Absolute 0.5 0.1 - 1.0 K/uL   Eosinophils Relative 1 %  Eosinophils Absolute 0.1 0.0 - 0.7 K/uL   Basophils Relative 0 %   Basophils Absolute 0.0 0.0 - 0.1 K/uL  Hepatic function panel  Result Value Ref Range   Total Protein 6.3 (L) 6.5 - 8.1 g/dL   Albumin 3.7 3.5 - 5.0 g/dL   AST 49 (H) 15 - 41 U/L   ALT 25 14 - 54 U/L   Alkaline Phosphatase 61 38 - 126 U/L   Total Bilirubin 0.6 0.3 - 1.2 mg/dL   Bilirubin, Direct 0.2 0.1 - 0.5 mg/dL   Indirect Bilirubin 0.4 0.3 - 0.9 mg/dL  Lipase, blood  Result Value Ref Range   Lipase 29 11 - 51 U/L  I-Stat Chem 8, ED  Result Value Ref Range   Sodium 140 135 - 145 mmol/L   Potassium 3.6 3.5 - 5.1 mmol/L   Chloride 106 101 - 111 mmol/L   BUN 13 6 - 20 mg/dL   Creatinine, Ser 0.80 0.44 - 1.00 mg/dL   Glucose, Bld 163 (H) 65 - 99 mg/dL   Calcium, Ion 1.11 (L) 1.15 - 1.40 mmol/L   TCO2 22 0 - 100 mmol/L   Hemoglobin 15.0 12.0 - 15.0 g/dL   HCT 44.0 36.0 - 46.0 %  I-Stat Beta hCG blood, ED ([facility], [facility], [facility] only)  Result Value Ref Range   I-stat hCG, quantitative <5.0 <5 mIU/mL   Comment 3          I-stat troponin, ED  Result Value Ref Range   Troponin i, poc 0.03 0.00 - 0.08 ng/mL   Comment 3           Dg Chest 2 View  Result Date: 07/27/2017 CLINICAL DATA:  Mid chest pain tonight EXAM: CHEST  2 VIEW COMPARISON:  07/01/2017 FINDINGS: The heart  size and mediastinal contours are within normal limits. Both lungs are clear. The visualized skeletal structures are unremarkable. IMPRESSION: No active cardiopulmonary disease. Electronically Signed   By: Lucienne Capers M.D.   On: 07/27/2017 02:08   Dg Chest 2 View  Result Date: 07/01/2017 CLINICAL DATA:  Mid right chest pain.  Shortness of breath. EXAM: CHEST  2 VIEW COMPARISON:  None. FINDINGS: The heart size and mediastinal contours are within normal limits. Both lungs are clear. The visualized skeletal structures are unremarkable. IMPRESSION: No active cardiopulmonary disease. Electronically Signed   By: Dorise Bullion III M.D   On: 07/01/2017 10:29   Ct Renal Stone Study  Result Date: 07/27/2017 CLINICAL DATA:  Right flank pain. History of gastroesophageal reflux disease, tubal ligation, C-section, appendectomy. EXAM: CT ABDOMEN AND PELVIS WITHOUT CONTRAST TECHNIQUE: Multidetector CT imaging of the abdomen and pelvis was performed following the standard protocol without IV contrast. COMPARISON:  Ultrasound right upper quadrant 07/01/2017. CT abdomen and pelvis 12/12/2005 FINDINGS: Lower chest: Lung bases are clear. Hepatobiliary: Small stones in the gallbladder. No inflammatory changes. No bile duct dilatation. No focal liver lesions. Pancreas: Unremarkable. No pancreatic ductal dilatation or surrounding inflammatory changes. Spleen: Normal in size without focal abnormality. Adrenals/Urinary Tract: No adrenal gland nodules. Kidneys are symmetrical in size. No hydronephrosis or hydroureter. No renal, ureteral, or bladder stones. No bladder wall thickening. Stomach/Bowel: Stomach is filled with ingested material. No gastric wall thickening appreciated. Small bowel are decompressed. Colon is not abnormally distended. Gas and stool-filled colon. No colonic wall thickening or inflammatory changes. Appendix is surgically absent. Vascular/Lymphatic: No significant vascular findings are present. No enlarged  abdominal or pelvic lymph nodes. Reproductive: An intrauterine device is present.  Uterus and ovaries are not enlarged. Other: No free air or free fluid in the abdomen or pelvis. Minimal umbilical hernia containing fat. Musculoskeletal: No acute or significant osseous findings. IMPRESSION: 1. No renal or ureteral stone or obstruction. 2. Cholelithiasis without inflammatory changes. 3. Intrauterine device present. 4. Minimal umbilical hernia containing fat. 5. No bowel obstruction or inflammation. Surgical absence of the appendix. Electronically Signed   By: Lucienne Capers M.D.   On: 07/27/2017 05:05   US Abdomen Limited Ruq  Result Date: 07/01/2017 CLINICAL DATA:  Right upper quadrant abdominal pain. EXAM: ULTRASOUND ABDOMEN LIMITED RIGHT UPPER QUADRANT COMPARISON:  Abdominal ultrasound on 12/11/2008 FINDINGS: Gallbladder: No gallstones or wall thickening visualized. No sonographic Murphy sign noted by sonographer. Common bile duct: Diameter: Normal caliber of 4 mm. Liver: No focal lesion identified. Within normal limits in parenchymal echogenicity. The portal vein is patent and demonstrates hepatopetal flow. IMPRESSION: Normal right upper quadrant abdominal ultrasound. Electronically Signed   By: Aletta Edouard M.D.   On: 07/01/2017 09:41    EKG  EKG Interpretation  Date/Time:  Thursday July 27 2017 01:27:10 EDT Ventricular Rate:  74 PR Interval:    QRS Duration: 92 QT Interval:  394 QTC Calculation: 438 R Axis:   60 Text Interpretation:  Sinus rhythm Confirmed by Randal Buba, Albertia Carvin ([id number]) on 07/27/2017 1:39:26 AM      Procedures Procedures (including critical care time)  Medications Ordered in ED Medications  dicyclomine (BENTYL) injection 20 mg (not administered)  gi cocktail (Maalox,Lidocaine,Donnatal) (30 mLs Oral Given 07/27/17 0213)  ondansetron (ZOFRAN) injection 4 mg (4 mg Intravenous Given 07/27/17 0211)      Final Clinical Impressions(s) / ED Diagnoses  Gastritis:  HEART score  1, low risk for mace.  PERC negative wells 0 highly doubt PE in this very low risk patient. Ruled out for Mi in the ED with normal EKG and 2 negative troponins.    Strict return precautions given.  Strict return precautions given for weakness, inability to tolerate oral medication,  Exertional chest pain, shortness of breath, leg pain and swelling, syncope or any concerns. No signs of systemic illness or infection. The patient is nontoxic-appearing on exam and vital signs are within normal limits. no stones seen on [place] 3 weeks ago but some on CT, will refer to surgery if ongoing problems.  Will start gerd friendly diet and omeprazole.    I have reviewed the triage vital signs and the nursing notes. Pertinent labs &imaging results that were available during my care of the patient were reviewed by me and considered in my medical decision making (see chart for details).  After history, exam, and medical workup I feel the patient has been appropriately medically screened and is safe for discharge home. Pertinent diagnoses were discussed with the patient. Patient was given return precautions.  I personally performed the services described in this documentation, which was scribed in my presence. The recorded information has been reviewed and is accurate.    Delno Blaisdell, MD 07/27/17 0865

## 2017-07-27 NOTE — ED Triage Notes (Signed)
Per EMS, pt c/o some central chest pressure that started at 11pm yesterday. Pt took some Nexium with no relief, 30 minutes later pt started getting pain radiating to RUQ of abd. Pt reports nausea and diaphoresis. Pt denies V/D and sob. Pt reports had similar episodes 18 years ago which turned out to be gallstone problems. Pt reports she started having flare ups one month ago but the pain tonight was the worst. Pt was given 324 asa and 145mcg of Fentanyl and 2 nitro with pain going down to a 5/10.

## 2017-07-27 NOTE — ED Notes (Signed)
ED Provider at bedside. 

## 2017-07-27 NOTE — ED Notes (Signed)
Informed provider that pt called out informing us that her pain is returning.

## 2017-08-01 ENCOUNTER — Ambulatory Visit
Admission: RE | Admit: 2017-08-01 | Discharge: 2017-08-01 | Disposition: A | Discharge status: Home / Self Care | Payer: BLUE CROSS/BLUE SHIELD | Source: Ambulatory Visit | Attending: Obstetrics & Gynecology | Admitting: Obstetrics & Gynecology

## 2017-08-01 DIAGNOSIS — Z0000 Encounter for general adult medical examination without abnormal findings: Secondary | ICD-10-CM

## 2017-08-01 DIAGNOSIS — Z1231 Encounter for screening mammogram for malignant neoplasm of breast: Secondary | ICD-10-CM | POA: Insufficient documentation

## 2017-08-07 ENCOUNTER — Ambulatory Visit (INDEPENDENT_AMBULATORY_CARE_PROVIDER_SITE_OTHER): Payer: BLUE CROSS/BLUE SHIELD | Admitting: Internal Medicine

## 2017-08-07 ENCOUNTER — Encounter: Payer: Self-pay | Admitting: Internal Medicine

## 2017-08-07 VITALS — HR 79 | Temp 98.3°F | Wt 259.0 lb

## 2017-08-07 DIAGNOSIS — K2900 Acute gastritis without bleeding: Secondary | ICD-10-CM

## 2017-08-07 DIAGNOSIS — F320 Major depressive disorder, single episode, mild: Secondary | ICD-10-CM

## 2017-08-07 LAB — H. PYLORI ANTIBODY, IGG: H Pylori IgG: NEGATIVE

## 2017-08-07 MED ORDER — AMITRIPTYLINE HCL 25 MG PO TABS: 25.0000 mg | ORAL_TABLET | Freq: Every day | ORAL | 1 refills | Status: DC

## 2017-08-07 MED ORDER — OMEPRAZOLE 20 MG PO CPDR: 20.0000 mg | DELAYED_RELEASE_CAPSULE | Freq: Every day | ORAL | 1 refills | Status: DC

## 2017-08-07 MED ORDER — RANITIDINE HCL 300 MG PO TABS: 300.0000 mg | ORAL_TABLET | Freq: Every day | ORAL | 1 refills | Status: DC

## 2017-08-07 MED ORDER — BUPROPION HCL ER (XL) 150 MG PO TB24: 150.0000 mg | ORAL_TABLET | Freq: Every day | ORAL | 2 refills | Status: DC

## 2017-08-07 MED ORDER — MELOXICAM 15 MG PO TABS: 15.0000 mg | ORAL_TABLET | Freq: Every day | ORAL | 1 refills | Status: DC

## 2017-08-07 NOTE — Progress Notes (Signed)
Subjective:    Patient ID: [name], female    DOB: 22-Oct-1971, 46 y.o.   MRN: [id number]  HPI  Pt presents to the clinic today for ER follow up. She went to the ER 8/2 with c/o RUQ abdominal pain. She had tried Nexium at home prior to the ER visit without any relief. ECG was normal. Troponin's were negative. CT abdomen showed:  IMPRESSION: 1. No renal or ureteral stone or obstruction. 2. Cholelithiasis without inflammatory changes. 3. Intrauterine device present. 4. Minimal umbilical hernia containing fat. 5. No bowel obstruction or inflammation. Surgical absence of the appendix.  She was diagnosed with gastritis, treated with Zofran and a GI Cocktail. She was started on Omeprazole and advised to follow up with her PCP. Since discharge, she reports that everything she eats bothers her stomach. She has taken Ranitidine in addition to Nexium and Prilosec, but nothing seems to help with her symptoms. She is taking Meloxicam daily for arthritis in her feet, which she reports does help. She has never seen GI in the past.  She also wants me to know that the Amitriptyline has helped tremendously with her headaches, but has done nothing for her depression. She stills feels down, sad, teary, angry and irritable. She is having trouble sleeping. She has no motivation to do anything. She denies SI/HI.  She is also requesting refills of all her medications today.  Review of Systems      Past Medical History:  Diagnosis Date  . Arthritis   . Depression   . Frequent headaches   . GERD (gastroesophageal reflux disease)     Current Outpatient Prescriptions  Medication Sig Dispense Refill  . acidophilus (RISAQUAD) CAPS capsule Take 1 capsule by mouth daily.    Marland Kitchen amitriptyline (ELAVIL) 25 MG tablet Take 1 tablet (25 mg total) by mouth at bedtime. 30 tablet 5  . butalbital-acetaminophen-caffeine (FIORICET, ESGIC) 50-325-40 MG tablet Take 1-2 tablets by mouth every 6 (six) hours as needed  for headache. 20 tablet 0  . esomeprazole (NEXIUM) 40 MG capsule Take 1 capsule (40 mg total) by mouth daily. 30 capsule 0  . levonorgestrel (MIRENA) 20 MCG/24HR IUD 1 each by Intrauterine route once.    . meloxicam (MOBIC) 15 MG tablet Take 1 tablet (15 mg total) by mouth daily. 30 tablet 5  . omeprazole (PRILOSEC) 20 MG capsule Take 1 capsule (20 mg total) by mouth daily. 30 capsule 0  . ondansetron (ZOFRAN ODT) 8 MG disintegrating tablet 8mg  ODT q8 hours prn nausea 12 tablet 0  . polyethylene glycol (MIRALAX) packet Take 17 g by mouth daily. 30 each 0  . ranitidine (ZANTAC) 150 MG tablet Take 1 tablet (150 mg total) by mouth at bedtime. 30 tablet 5   No current facility-administered medications for this visit.     No Known Allergies  Family History  Problem Relation Age of Onset  . Adopted: Yes  . Family history unknown: Yes    Social History   Social History  . Marital status: Single    Spouse name: N/A  . Number of children: N/A  . Years of education: N/A   Occupational History  . Not on file.   Social History Main Topics  . Smoking status: Former Smoker    Packs/day: 0.10    Types: Cigarettes  . Smokeless tobacco: Never Used     Comment: recently quit  . Alcohol use 0.0 oz/week     Comment: occasional  . Drug use: No  .  Sexual activity: Yes    Birth control/ protection: IUD, Surgical     Comment: BTL   Other Topics Concern  . Not on file   Social History Narrative  . No narrative on file     Constitutional: Denies fever, malaise, fatigue, headache or abrupt weight changes.  Respiratory: Denies difficulty breathing, shortness of breath, cough or sputum production.   Cardiovascular: Denies chest pain, chest tightness, palpitations or swelling in the hands or feet.  Gastrointestinal: Pt reports abdominal pain. Denies bloating, constipation, diarrhea or blood in the stool.  GU: Denies urgency, frequency, pain with urination, burning sensation, blood in urine,  odor or discharge. Psych: Pt reports depression. Denies anxiety, SI/HI.  No other specific complaints in a complete review of systems (except as listed in HPI above).  Objective:   Physical Exam  Pulse 79   Temp 98.3 F (36.8 C) (Oral)   Wt 259 lb (117.5 kg)   BMI 45.88 kg/m  Wt Readings from Last 3 Encounters:  08/07/17 259 lb (117.5 kg)  07/27/17 260 lb (117.9 kg)  07/18/17 262 lb (118.8 kg)    General: Appears her stated age, obese in NAD. Cardiovascular: Normal rate and rhythm.  Pulmonary/Chest: Normal effort and positive vesicular breath sounds. No respiratory distress. No wheezes, rales or ronchi noted.  Abdomen: Soft and tender in the epigastric region. Normal bowel sounds. No distention or masses noted.  Neurological: Alert and oriented.  Psychiatric: Mood and affect mildly flat.  BMET    Component Value Date/Time   NA 140 07/27/2017 0143   K 3.6 07/27/2017 0143   CL 106 07/27/2017 0143   CO2 25 07/01/2017 0842   GLUCOSE 163 (H) 07/27/2017 0143   BUN 13 07/27/2017 0143   CREATININE 0.80 07/27/2017 0143   CALCIUM 8.7 (L) 07/01/2017 0842   GFRNONAA >60 07/01/2017 0842   GFRAA >60 07/01/2017 0842    Lipid Panel     Component Value Date/Time   CHOL 169 12/23/2016 1459   TRIG 69.0 12/23/2016 1459   HDL 48.80 12/23/2016 1459   CHOLHDL 3 12/23/2016 1459   VLDL 13.8 12/23/2016 1459   LDLCALC 106 (H) 12/23/2016 1459    CBC    Component Value Date/Time   WBC 10.1 07/27/2017 0135   RBC 4.73 07/27/2017 0135   HGB 15.0 07/27/2017 0143   HCT 44.0 07/27/2017 0143   PLT 179 07/27/2017 0135   MCV 93.7 07/27/2017 0135   MCH 32.3 07/27/2017 0135   MCHC 34.5 07/27/2017 0135   RDW 12.5 07/27/2017 0135   LYMPHSABS 1.5 07/27/2017 0135   MONOABS 0.5 07/27/2017 0135   EOSABS 0.1 07/27/2017 0135   BASOSABS 0.0 07/27/2017 0135    Hgb A1C Lab Results  Component Value Date   HGBA1C 5.2 12/23/2016            Assessment & Plan:   ER Follow up for Acute  Gastritis:  ER notes, labs and imaging reviewed with the patient. Increase Prilosec to 40 mg daily x 2 weeks then decrease back to 20 mg once daily Continue Ranitidine Will check H Pylori today If no improvement, will refer to GI  Depression:  Will trial Wellbutrin, eRx sent to pharmacy Update me in 4 weeks via mychart to let me know how your are doing   Return precautions discussed Webb Silversmith, NP

## 2017-08-07 NOTE — Patient Instructions (Signed)
Gastritis, Adult  Gastritis is swelling (inflammation) of the stomach. When you have this condition, you can have these problems (symptoms):  ? Pain in your stomach.  ? A burning feeling in your stomach.  ? Feeling sick to your stomach (nauseous).  ? Throwing up (vomiting).  ? Feeling too full after you eat.  It is important to get help for this condition. Without help, your stomach can bleed, and you can get sores (ulcers) in your stomach.  Follow these instructions at home:  ? Take over-the-counter and prescription medicines only as told by your doctor.  ? If you were prescribed an antibiotic medicine, take it as told by your doctor. Do not stop taking it even if you start to feel better.  ? Drink enough fluid to keep your pee (urine) clear or pale yellow.  ? Instead of eating big meals, eat small meals often.  Contact a health care provider if:  ? Your problems get worse.  ? Your problems go away and then come back.  Get help right away if:  ? You throw up blood or something that looks like coffee grounds.  ? You have black or dark red poop (stools).  ? You cannot keep fluids down.  ? Your stomach pain gets worse.  ? You have a fever.  ? You do not feel better after 1 week.  This information is not intended to replace advice given to you by your health care provider. Make sure you discuss any questions you have with your health care provider.  Document Released: 05/30/2008 Document Revised: 08/10/2016 Document Reviewed: 09/05/2015  [employer]vier Interactive Patient Education ? 2018 [employer]

## 2017-08-14 ENCOUNTER — Ambulatory Visit (INDEPENDENT_AMBULATORY_CARE_PROVIDER_SITE_OTHER): Payer: BLUE CROSS/BLUE SHIELD | Admitting: Urology

## 2017-08-14 ENCOUNTER — Encounter: Payer: Self-pay | Admitting: Urology

## 2017-08-14 VITALS — BP 121/80 | HR 82 | Ht 63.0 in | Wt 262.8 lb

## 2017-08-14 DIAGNOSIS — N3946 Mixed incontinence: Secondary | ICD-10-CM

## 2017-08-14 DIAGNOSIS — N393 Stress incontinence (female) (male): Secondary | ICD-10-CM | POA: Diagnosis not present

## 2017-08-14 DIAGNOSIS — N8111 Cystocele, midline: Secondary | ICD-10-CM | POA: Diagnosis not present

## 2017-08-14 DIAGNOSIS — N3941 Urge incontinence: Secondary | ICD-10-CM | POA: Diagnosis not present

## 2017-08-14 NOTE — Progress Notes (Addendum)
08/14/2017 2:40 PM   [name] January 30, 1971 [id number]  Referring provider: Jearld Fenton, NP [address] [place], [place] [address]  Chief Complaint  Patient presents with  . New Patient (Initial Visit)    mixed incontinence referred by [name]    HPI: Patient is a 46 -year-old Caucasian female who is referred to [place] by,Dr. Hulan Fray, for urinary incontinence.  Patient states that she has had urinary incontinence for two years ago  Patient has incontinence with mild SUI and UI.   She is experiencing 8 or more incontinent episodes during the day. She is experiencing 1 incontinent episodes during the night.  Her incontinence volume is large.   She is wearing 4 to 5 pads/depends daily, thin panty liners.    She is having associated urinary frequency x 8, strong urgency and nocturia x 0-3.  She does restrict fluid and effort to improve leakage and she does engage with toilet mapping.  She does not have a history of urinary tract infections, STI's or injury to the bladder.   She denies dysuria, gross hematuria, suprapubic pain, back pain, abdominal pain or flank pain.  She has not had any recent fevers, chills, nausea or vomiting.     She does not have a history of nephrolithiasis, GU surgery or GU trauma.  She is sexually active.  She has noted incontinence with sexual intercourse.    She is not post menopausal.   She admits to loose stools.   She is not having pain with bladder filling.    A non contrast CT was performed on 07/26/2017 and it noted No renal or ureteral stone or obstruction.  Cholelithiasis without inflammatory changes.  Intrauterine device present.  Minimal umbilical hernia containing fat.  No bowel obstruction or inflammation. Surgical absence of the appendix.  She is drinking 2 bottles of water daily.   She is drinking two caffeinated beverages daily.  She is drinking not alcoholic beverages daily.    Her risk factors for incontinence are obesity,  parity, vaginal delivery, age, smoking, caffeine, diabetes and depression.  She is taking antidepressants.    Reviewed referral notes.    PMH: Past Medical History:  Diagnosis Date  . Adopted   . Arthritis   . Depression   . Frequent headaches   . GERD (gastroesophageal reflux disease)     Surgical History: Past Surgical History:  Procedure Laterality Date  . APPENDECTOMY    . CESAREAN SECTION    . TUBAL LIGATION    . WRIST SURGERY Left 11/21/2016   plates and screws inserted    Home Medications:  Allergies as of 08/14/2017   No Known Allergies     Medication List       Accurate as of 08/14/17  2:40 PM. Always use your most recent med list.          acidophilus Caps capsule Take 1 capsule by mouth daily.   amitriptyline 25 MG tablet Commonly known as:  ELAVIL Take 1 tablet (25 mg total) by mouth at bedtime.   buPROPion 150 MG 24 hr tablet Commonly known as:  WELLBUTRIN XL Take 1 tablet (150 mg total) by mouth daily.   butalbital-acetaminophen-caffeine 50-325-40 MG tablet Commonly known as:  FIORICET, ESGIC Take 1-2 tablets by mouth every 6 (six) hours as needed for headache.   levonorgestrel 20 MCG/24HR IUD Commonly known as:  MIRENA 1 each by Intrauterine route once.   meloxicam 15 MG tablet Commonly known as:  MOBIC Take 1 tablet (15 mg total) by mouth daily.   omeprazole 20 MG capsule Commonly known as:  PRILOSEC Take 1 capsule (20 mg total) by mouth daily.   ondansetron 8 MG disintegrating tablet Commonly known as:  ZOFRAN ODT 8mg  ODT q8 hours prn nausea   polyethylene glycol packet Commonly known as:  MIRALAX Take 17 g by mouth daily.   ranitidine 300 MG tablet Commonly known as:  ZANTAC Take 1 tablet (300 mg total) by mouth at bedtime.       Allergies: No Known Allergies  Family History: Family History  Problem Relation Age of Onset  . Adopted: Yes  . Family history unknown: Yes    Social History:  reports that she quit  smoking about 2 years ago. Her smoking use included Cigarettes. She smoked 0.10 packs per day. She has never used smokeless tobacco. She reports that she drinks alcohol. She reports that she does not use drugs.  ROS: UROLOGY Frequent Urination?: Yes Hard to postpone urination?: Yes Burning/pain with urination?: No Get up at night to urinate?: Yes Leakage of urine?: Yes Urine stream starts and stops?: No Trouble starting stream?: No Do you have to strain to urinate?: No Blood in urine?: No Urinary tract infection?: No Sexually transmitted disease?: No Injury to kidneys or bladder?: No Painful intercourse?: No Weak stream?: No Currently pregnant?: No Vaginal bleeding?: No Last menstrual period?: iud  Gastrointestinal Nausea?: No Vomiting?: No Indigestion/heartburn?: Yes Diarrhea?: No Constipation?: No  Constitutional Fever: No Night sweats?: No Weight loss?: No Fatigue?: No  Skin Skin rash/lesions?: No Itching?: No  Eyes Blurred vision?: No Double vision?: No  Ears/Nose/Throat Sore throat?: No Sinus problems?: No  Hematologic/Lymphatic Swollen glands?: No Easy bruising?: Yes  Cardiovascular Leg swelling?: No Chest pain?: No  Respiratory Cough?: No Shortness of breath?: No  Endocrine Excessive thirst?: No  Musculoskeletal Back pain?: Yes Joint pain?: Yes  Neurological Headaches?: Yes Dizziness?: No  Psychologic Depression?: Yes Anxiety?: No  Physical Exam: BP 121/80   Pulse 82   Ht 5\' 3"  (1.6 m)   Wt 262 lb 12.8 oz (119.2 kg)   BMI 46.55 kg/m   Constitutional: Well nourished. Alert and oriented, No acute distress. HEENT: [place] AT, moist mucus membranes. Trachea midline, no masses. Cardiovascular: No clubbing, cyanosis, or edema. Respiratory: Normal respiratory effort, no increased work of breathing. GI: Abdomen is soft, non tender, non distended, no abdominal masses. Liver and spleen not palpable.  No hernias appreciated.  Stool sample  for occult testing is not indicated.   GU: No CVA tenderness.  No bladder fullness or masses.  Normal external genitalia, normal pubic hair distribution, no lesions.  Normal urethral meatus, no lesions, no prolapse, no discharge.   No urethral masses, tenderness and/or tenderness. No bladder fullness, tenderness or masses. Normal vagina mucosa, good estrogen effect, no discharge, no lesions, good pelvic support, Grade I cystocele or rectocele noted.  No cervical motion tenderness.  Uterus is freely mobile and non-fixed.  No adnexal/parametria masses or tenderness noted.  Anus and perineum are without rashes or lesions.    Skin: No rashes, bruises or suspicious lesions. Lymph: No cervical or inguinal adenopathy. Neurologic: Grossly intact, no focal deficits, moving all 4 extremities. Psychiatric: Normal mood and affect.  Laboratory Data: Lab Results  Component Value Date   WBC 10.1 07/27/2017   HGB 15.0 07/27/2017   HCT 44.0 07/27/2017   MCV 93.7 07/27/2017   PLT 179 07/27/2017    Lab Results  Component Value  Date   CREATININE 0.80 07/27/2017     Lab Results  Component Value Date   HGBA1C 5.2 12/23/2016       Component Value Date/Time   CHOL 169 12/23/2016 1459   HDL 48.80 12/23/2016 1459   CHOLHDL 3 12/23/2016 1459   VLDL 13.8 12/23/2016 1459   LDLCALC 106 (H) 12/23/2016 1459    Lab Results  Component Value Date   AST 49 (H) 07/27/2017   Lab Results  Component Value Date   ALT 25 07/27/2017    I have reviewed the labs.   Pertinent Imaging: CLINICAL DATA:  Right flank pain. History of gastroesophageal reflux disease, tubal ligation, C-section, appendectomy.  EXAM: CT ABDOMEN AND PELVIS WITHOUT CONTRAST  TECHNIQUE: Multidetector CT imaging of the abdomen and pelvis was performed following the standard protocol without IV contrast.  COMPARISON:  Ultrasound right upper quadrant 07/01/2017. CT abdomen and pelvis 12/12/2005  FINDINGS: Lower chest: Lung  bases are clear.  Hepatobiliary: Small stones in the gallbladder. No inflammatory changes. No bile duct dilatation. No focal liver lesions.  Pancreas: Unremarkable. No pancreatic ductal dilatation or surrounding inflammatory changes.  Spleen: Normal in size without focal abnormality.  Adrenals/Urinary Tract: No adrenal gland nodules. Kidneys are symmetrical in size. No hydronephrosis or hydroureter. No renal, ureteral, or bladder stones. No bladder wall thickening.  Stomach/Bowel: Stomach is filled with ingested material. No gastric wall thickening appreciated. Small bowel are decompressed. Colon is not abnormally distended. Gas and stool-filled colon. No colonic wall thickening or inflammatory changes. Appendix is surgically absent.  Vascular/Lymphatic: No significant vascular findings are present. No enlarged abdominal or pelvic lymph nodes.  Reproductive: An intrauterine device is present. Uterus and ovaries are not enlarged.  Other: No free air or free fluid in the abdomen or pelvis. Minimal umbilical hernia containing fat.  Musculoskeletal: No acute or significant osseous findings.  IMPRESSION: 1. No renal or ureteral stone or obstruction. 2. Cholelithiasis without inflammatory changes. 3. Intrauterine device present. 4. Minimal umbilical hernia containing fat. 5. No bowel obstruction or inflammation. Surgical absence of the appendix.   Electronically Signed   By: Lucienne Capers M.D.   On: 07/27/2017 05:05  I have independently reviewed the films.    Assessment & Plan:    1. Mixed Incontinence  - offered behavioral therapies, bladder training and bladder control strategies  - pelvic floor muscle training - patient doing Kegel's at home  - fluid management   - offered medical therapy with anticholinergic therapy or beta-3 adrenergic receptor agonist and the potential side effects of each therapy   - would like to try anticholinergic therapy.  Toviaz 8mg  samples, # 28.   Advised of the side effects, such as: Dry eyes, dry mouth, constipation, mental confusion and/or urinary retention.   - RTC in 3 weeks for PVR and symptom recheck   2. Urge incontinence  - most bothersome symptom  - see above  3. SUI  - mild at this time - will reassess in 3 weeks  4. Cystocele  - see above  Return in about 3 weeks (around 09/04/2017) for PVR and OAB questionnaire.  These notes generated with voice recognition software. I apologize for typographical errors.  Zara Council, [facility] [address] [place], [place] [address] [phone]

## 2017-08-18 ENCOUNTER — Telehealth: Payer: Self-pay | Admitting: Urology

## 2017-08-18 DIAGNOSIS — N3281 Overactive bladder: Secondary | ICD-10-CM

## 2017-08-18 NOTE — Telephone Encounter (Signed)
Spoke with pt and reinforced this is a side effect of the medication. Pt requested another medication without the side effects. Please advise.

## 2017-08-18 NOTE — Telephone Encounter (Signed)
We can try Myrbetriq 25 mg.  She can have samples to try.

## 2017-08-18 NOTE — Telephone Encounter (Signed)
Pt was seen this past week and was given samples of Toviaz.  She has extremely dry mouth and wants to know if there is anything she can do about it.  Please call her at work [phone], she works in [employer], [employer].

## 2017-08-21 NOTE — Telephone Encounter (Signed)
LMOM

## 2017-08-24 NOTE — Telephone Encounter (Signed)
Spoke with pt and she will come by tomorrow to pick up samples.

## 2017-08-24 NOTE — Telephone Encounter (Signed)
LMOM

## 2017-08-25 MED ORDER — MIRABEGRON ER 25 MG PO TB24: 25.0000 mg | ORAL_TABLET | Freq: Every day | ORAL | 0 refills | Status: DC

## 2017-08-25 NOTE — Telephone Encounter (Signed)
Script was sent in to pharmacy.

## 2017-08-25 NOTE — Addendum Note (Signed)
Addended by: Toniann Fail C on: 08/25/2017 03:04 PM   Modules accepted: Orders

## 2017-08-25 NOTE — Telephone Encounter (Signed)
Patient is not able to come to the office before we close to pick up the samples of 25mg  Myrbetriq.  She is requesting that we send in a prescription to the [facility] on [address] in [place].  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

## 2017-09-04 ENCOUNTER — Ambulatory Visit: Payer: BLUE CROSS/BLUE SHIELD | Admitting: Urology

## 2017-09-06 ENCOUNTER — Telehealth: Payer: Self-pay | Admitting: Urology

## 2017-09-06 ENCOUNTER — Other Ambulatory Visit: Payer: Self-pay | Admitting: Urology

## 2017-09-06 MED ORDER — MIRABEGRON ER 50 MG PO TB24: 50.0000 mg | ORAL_TABLET | Freq: Every day | ORAL | 0 refills | Status: DC

## 2017-09-06 NOTE — Progress Notes (Unsigned)
I have sent a prescription for the Myrbetriq 50 to her pharmacy. I will need to see her in 3 weeks.

## 2017-09-06 NOTE — Telephone Encounter (Signed)
Patient called the office and left a voice mail message.  She was prescribed the 25mg  Myrbetriq and it was not working well.  She started taking 2 pills at a time and seems to be working better.  She is out of medication.  She is requesting a prescription for 50mg  Myrbetriq to be called into the [facility] on [address] [phone]).

## 2017-09-22 NOTE — Progress Notes (Deleted)
09/25/2017 10:14 AM   [name] 1971/07/23 [id number]  Referring provider: Jearld Fenton, NP [address] [place], [place] [address]  No chief complaint on file.   HPI: 46 yo WF with a mixed urinary incontinence and a cystocele who presents today for follow up after a trial of Myrbetriq 50 mg daily.  Background history Patient is a 56 -year-old Caucasian female who is referred to [place] by Dr. Hulan Fray for urinary incontinence.  Patient states that she has had urinary incontinence for two years ago.  Patient has incontinence with mild SUI and UI.   She is experiencing 8 or more incontinent episodes during the day. She is experiencing 1 incontinent episodes during the night.  Her incontinence volume is large.   She is wearing 4 to 5 pads/depends daily, thin panty liners.  She is having associated urinary frequency x 8, strong urgency and nocturia x 0-3.  She does restrict fluid and effort to improve leakage and she does engage with toilet mapping.  She does not have a history of urinary tract infections, STI's or injury to the bladder.   She denies dysuria, gross hematuria, suprapubic pain, back pain, abdominal pain or flank pain.  She has not had any recent fevers, chills, nausea or vomiting.   She does not have a history of nephrolithiasis, GU surgery or GU trauma.  She is sexually active.  She has noted incontinence with sexual intercourse.  She is not post menopausal.   She admits to loose stools.   She is not having pain with bladder filling.   A non contrast CT was performed on 07/26/2017 and it noted No renal or ureteral stone or obstruction.  Cholelithiasis without inflammatory changes.  Intrauterine device present.  Minimal umbilical hernia containing fat.  No bowel obstruction or inflammation. Surgical absence of the appendix.  She is drinking 2 bottles of water daily.   She is drinking two caffeinated beverages daily.  She is drinking not alcoholic beverages daily.  Her risk  factors for incontinence are obesity, parity, vaginal delivery, age, smoking, caffeine, diabetes and depression.  She is taking antidepressants.    She was initially given Toviaz samples, but she was experiencing extreme dry mouth. She then discontinue the Toviaz samples and started on Myrbetriq 25 mg daily.  She then called back requesting that the Myrbetriq be increased to 50 mg.    Today, she is experiencing urgency x *** (***), frequency x *** (***), not/is restricting fluids to avoid visits to the restroom ***, not/is engaging in toilet mapping, incontinence x *** (***) and nocturia x *** (***).  Her PVR is ***.  Her BP is ***.   She is not experiencing dysuria, gross hematuria or suprapubic pain. She has not had recent fevers, chills, nausea or vomiting.    PMH: Past Medical History:  Diagnosis Date  . Adopted   . Arthritis   . Depression   . Frequent headaches   . GERD (gastroesophageal reflux disease)     Surgical History: Past Surgical History:  Procedure Laterality Date  . APPENDECTOMY    . CESAREAN SECTION    . TUBAL LIGATION    . WRIST SURGERY Left 11/21/2016   plates and screws inserted    Home Medications:  Allergies as of 09/25/2017   No Known Allergies     Medication List       Accurate as of 09/22/17 10:14 AM. Always use your most recent med list.  acidophilus Caps capsule Take 1 capsule by mouth daily.   amitriptyline 25 MG tablet Commonly known as:  ELAVIL Take 1 tablet (25 mg total) by mouth at bedtime.   buPROPion 150 MG 24 hr tablet Commonly known as:  WELLBUTRIN XL Take 1 tablet (150 mg total) by mouth daily.   levonorgestrel 20 MCG/24HR IUD Commonly known as:  MIRENA 1 each by Intrauterine route once.   meloxicam 15 MG tablet Commonly known as:  MOBIC Take 1 tablet (15 mg total) by mouth daily.   mirabegron ER 50 MG Tb24 tablet Commonly known as:  MYRBETRIQ Take 1 tablet (50 mg total) by mouth daily.   omeprazole 20 MG  capsule Commonly known as:  PRILOSEC Take 1 capsule (20 mg total) by mouth daily.   ondansetron 8 MG disintegrating tablet Commonly known as:  ZOFRAN ODT 8mg  ODT q8 hours prn nausea   polyethylene glycol packet Commonly known as:  MIRALAX Take 17 g by mouth daily.   ranitidine 300 MG tablet Commonly known as:  ZANTAC Take 1 tablet (300 mg total) by mouth at bedtime.       Allergies: No Known Allergies  Family History: Family History  Problem Relation Age of Onset  . Adopted: Yes  . Family history unknown: Yes    Social History:  reports that she quit smoking about 2 years ago. Her smoking use included Cigarettes. She smoked 0.10 packs per day. She has never used smokeless tobacco. She reports that she drinks alcohol. She reports that she does not use drugs.  ROS:                                        Physical Exam: There were no vitals taken for this visit.  Constitutional: Well nourished. Alert and oriented, No acute distress. HEENT: [place] AT, moist mucus membranes. Trachea midline, no masses. Cardiovascular: No clubbing, cyanosis, or edema. Respiratory: Normal respiratory effort, no increased work of breathing. Skin: No rashes, bruises or suspicious lesions. Lymph: No cervical or inguinal adenopathy. Neurologic: Grossly intact, no focal deficits, moving all 4 extremities. Psychiatric: Normal mood and affect.  Laboratory Data: Lab Results  Component Value Date   WBC 10.1 07/27/2017   HGB 15.0 07/27/2017   HCT 44.0 07/27/2017   MCV 93.7 07/27/2017   PLT 179 07/27/2017    Lab Results  Component Value Date   CREATININE 0.80 07/27/2017     Lab Results  Component Value Date   HGBA1C 5.2 12/23/2016       Component Value Date/Time   CHOL 169 12/23/2016 1459   HDL 48.80 12/23/2016 1459   CHOLHDL 3 12/23/2016 1459   VLDL 13.8 12/23/2016 1459   LDLCALC 106 (H) 12/23/2016 1459    Lab Results  Component Value Date   AST 49 (H)  07/27/2017   Lab Results  Component Value Date   ALT 25 07/27/2017    I have reviewed the labs.   Pertinent Imaging: ***  Assessment & Plan:    1. Mixed Incontinence  - offered behavioral therapies, bladder training and bladder control strategies  - pelvic floor muscle training - patient doing Kegel's at home  - fluid management   - ***  - RTC in 3 weeks for PVR and symptom recheck   2. Urge incontinence  - most bothersome symptom  - see above  3. SUI  - mild at this time -  will reassess in 3 weeks  4. Cystocele  - see above  No Follow-up on file.  These notes generated with voice recognition software. I apologize for typographical errors.  Zara Council, [facility] [address] [place], [place] [address] [phone]

## 2017-09-25 ENCOUNTER — Ambulatory Visit: Payer: BLUE CROSS/BLUE SHIELD | Admitting: Urology

## 2017-09-25 ENCOUNTER — Encounter: Payer: Self-pay | Admitting: Urology

## 2017-10-18 ENCOUNTER — Ambulatory Visit (INDEPENDENT_AMBULATORY_CARE_PROVIDER_SITE_OTHER): Payer: BLUE CROSS/BLUE SHIELD | Admitting: Urology

## 2017-10-18 ENCOUNTER — Encounter: Payer: Self-pay | Admitting: Urology

## 2017-10-18 VITALS — BP 120/81 | HR 89 | Ht 63.0 in | Wt 258.3 lb

## 2017-10-18 DIAGNOSIS — N3941 Urge incontinence: Secondary | ICD-10-CM

## 2017-10-18 DIAGNOSIS — N3946 Mixed incontinence: Secondary | ICD-10-CM | POA: Diagnosis not present

## 2017-10-18 DIAGNOSIS — N8111 Cystocele, midline: Secondary | ICD-10-CM

## 2017-10-18 DIAGNOSIS — N393 Stress incontinence (female) (male): Secondary | ICD-10-CM | POA: Diagnosis not present

## 2017-10-18 LAB — BLADDER SCAN AMB NON-IMAGING: Scan Result: 44

## 2017-10-18 MED ORDER — SOLIFENACIN SUCCINATE 10 MG PO TABS: 10.0000 mg | ORAL_TABLET | Freq: Every day | ORAL | 3 refills | Status: DC

## 2017-10-18 NOTE — Progress Notes (Signed)
10/18/2017 2:22 PM   [name] 1971/12/11 [id number]  Referring provider: Jearld Fenton, NP [address] [place], [place] [address]  Chief Complaint  Patient presents with  . Follow-up    3 weeks mixed Incontinence    HPI: 46 yo WF with a mixed urinary incontinence and a cystocele who presents today for follow up after a trial of Myrbetriq 50 mg daily.  Background history Patient is a 31 -year-old Caucasian female who is referred to [place] by Dr. Hulan Fray for urinary incontinence.  Patient states that she has had urinary incontinence for two years ago.  Patient has incontinence with mild SUI and UI.   She is experiencing 8 or more incontinent episodes during the day. She is experiencing 1 incontinent episodes during the night.  Her incontinence volume is large.   She is wearing 4 to 5 pads/depends daily, thin panty liners.  She is having associated urinary frequency x 8, strong urgency and nocturia x 0-3.  She does restrict fluid and effort to improve leakage and she does engage with toilet mapping.  She does not have a history of urinary tract infections, STI's or injury to the bladder.   She denies dysuria, gross hematuria, suprapubic pain, back pain, abdominal pain or flank pain.  She has not had any recent fevers, chills, nausea or vomiting.   She does not have a history of nephrolithiasis, GU surgery or GU trauma.  She is sexually active.  She has noted incontinence with sexual intercourse.  She is not post menopausal.   She admits to loose stools.   She is not having pain with bladder filling.   A non contrast CT was performed on 07/26/2017 and it noted No renal or ureteral stone or obstruction.  Cholelithiasis without inflammatory changes.  Intrauterine device present.  Minimal umbilical hernia containing fat.  No bowel obstruction or inflammation. Surgical absence of the appendix.  She is drinking 2 bottles of water daily.   She is drinking two caffeinated beverages daily.  She  is drinking not alcoholic beverages daily.  Her risk factors for incontinence are obesity, parity, vaginal delivery, age, smoking, caffeine, diabetes and depression.  She is taking antidepressants.    She was initially given Toviaz samples, but she was experiencing extreme dry mouth. She then discontinue the Toviaz samples and started on Myrbetriq 25 mg daily.  She then called back requesting that the Myrbetriq be increased to 50 mg.    Today, she is experiencing urgency x 4-7, frequency x 4-7, not restricting fluids to avoid visits to the restroom, is engaging in toilet mapping, incontinence x 8 or more and nocturia x 0-3.  Her PVR is 44 mL.  Her BP is 120/81.   She is not experiencing dysuria, gross hematuria or suprapubic pain. She has not had recent fevers, chills, nausea or vomiting.  She did not feel that the Myrbetriq 50 mg daily was effective.  The leaking is so bad, it is causing skin breakdown.    The [name] is very effective for her, she states she was completely dry with that medication. The only reason why she stopped it was because of the extreme dry mouth.   PMH: Past Medical History:  Diagnosis Date  . Adopted   . Arthritis   . Depression   . Frequent headaches   . GERD (gastroesophageal reflux disease)     Surgical History: Past Surgical History:  Procedure Laterality Date  . APPENDECTOMY    .  CESAREAN SECTION    . TUBAL LIGATION    . WRIST SURGERY Left 11/21/2016   plates and screws inserted    Home Medications:  Allergies as of 10/18/2017   No Known Allergies     Medication List       Accurate as of 10/18/17  2:22 PM. Always use your most recent med list.          acidophilus Caps capsule Take 1 capsule by mouth daily.   amitriptyline 25 MG tablet Commonly known as:  ELAVIL Take 1 tablet (25 mg total) by mouth at bedtime.   buPROPion 150 MG 24 hr tablet Commonly known as:  WELLBUTRIN XL Take 1 tablet (150 mg total) by mouth daily.     levonorgestrel 20 MCG/24HR IUD Commonly known as:  MIRENA 1 each by Intrauterine route once.   meloxicam 15 MG tablet Commonly known as:  MOBIC Take 1 tablet (15 mg total) by mouth daily.   mirabegron ER 50 MG Tb24 tablet Commonly known as:  MYRBETRIQ Take 1 tablet (50 mg total) by mouth daily.   omeprazole 20 MG capsule Commonly known as:  PRILOSEC Take 1 capsule (20 mg total) by mouth daily.   ondansetron 8 MG disintegrating tablet Commonly known as:  ZOFRAN ODT 8mg  ODT q8 hours prn nausea   polyethylene glycol packet Commonly known as:  MIRALAX Take 17 g by mouth daily.   ranitidine 300 MG tablet Commonly known as:  ZANTAC Take 1 tablet (300 mg total) by mouth at bedtime.   solifenacin 10 MG tablet Commonly known as:  VESICARE Take 1 tablet (10 mg total) by mouth daily.       Allergies: No Known Allergies  Family History: Family History  Problem Relation Age of Onset  . Adopted: Yes  . Family history unknown: Yes    Social History:  reports that she quit smoking about 2 years ago. Her smoking use included Cigarettes. She smoked 0.10 packs per day. She has never used smokeless tobacco. She reports that she drinks alcohol. She reports that she does not use drugs.  ROS: UROLOGY Frequent Urination?: Yes Hard to postpone urination?: No Burning/pain with urination?: No Get up at night to urinate?: No Leakage of urine?: Yes Urine stream starts and stops?: No Trouble starting stream?: No Do you have to strain to urinate?: No Blood in urine?: No Urinary tract infection?: No Sexually transmitted disease?: No Injury to kidneys or bladder?: No Painful intercourse?: No Weak stream?: No Currently pregnant?: No Vaginal bleeding?: No Last menstrual period?: n  Gastrointestinal Nausea?: No Vomiting?: No Indigestion/heartburn?: Yes Diarrhea?: No Constipation?: No  Constitutional Fever: No Night sweats?: No Weight loss?: No Fatigue?: No  Skin Skin  rash/lesions?: No Itching?: No  Eyes Blurred vision?: No Double vision?: No  Ears/Nose/Throat Sore throat?: No Sinus problems?: No  Hematologic/Lymphatic Swollen glands?: No Easy bruising?: No  Cardiovascular Leg swelling?: No Chest pain?: No  Respiratory Cough?: No Shortness of breath?: No  Endocrine Excessive thirst?: No  Musculoskeletal Back pain?: No Joint pain?: No  Neurological Headaches?: No Dizziness?: No  Psychologic Depression?: Yes Anxiety?: No  Physical Exam: BP 120/81   Pulse 89   Ht 5\' 3"  (1.6 m)   Wt 258 lb 4.8 oz (117.2 kg)   BMI 45.76 kg/m   Constitutional: Well nourished. Alert and oriented, No acute distress. HEENT: [place] AT, moist mucus membranes. Trachea midline, no masses. Cardiovascular: No clubbing, cyanosis, or edema. Respiratory: Normal respiratory effort, no increased work of breathing.  Skin: No rashes, bruises or suspicious lesions. Lymph: No cervical or inguinal adenopathy. Neurologic: Grossly intact, no focal deficits, moving all 4 extremities. Psychiatric: Normal mood and affect.  Laboratory Data: Lab Results  Component Value Date   WBC 10.1 07/27/2017   HGB 15.0 07/27/2017   HCT 44.0 07/27/2017   MCV 93.7 07/27/2017   PLT 179 07/27/2017    Lab Results  Component Value Date   CREATININE 0.80 07/27/2017     Lab Results  Component Value Date   HGBA1C 5.2 12/23/2016       Component Value Date/Time   CHOL 169 12/23/2016 1459   HDL 48.80 12/23/2016 1459   CHOLHDL 3 12/23/2016 1459   VLDL 13.8 12/23/2016 1459   LDLCALC 106 (H) 12/23/2016 1459    Lab Results  Component Value Date   AST 49 (H) 07/27/2017   Lab Results  Component Value Date   ALT 25 07/27/2017    I have reviewed the labs.   Pertinent Imaging: Results for HILIANA, EILTS (MRN [id number]) as of 10/18/2017 14:13  Ref. Range 10/18/2017 14:07  Scan Result Unknown 44    Assessment & Plan:    1. Mixed Incontinence  - offered  behavioral therapies, bladder training and bladder control strategies  - pelvic floor muscle training - patient doing Kegel's at home  - fluid management   - Seems to respond well to anticholinergic therapy, but cannot tolerate side effects - we will try Vesicare 10 mg daily to see if she gets the same urinary control but without the intolerable dry mouth - she will call back in 2 weeks to tell us her status and if the Vesicare is not effective we will go on to the next anticholinergic and until we have exhausted them all  - Patient to call report experience with Vesicare  2. Urge incontinence  - most bothersome symptom  - see above  3. SUI  - mild at this time - will reassess in 3 weeks  4. Cystocele  - see above  Return for patient to call.  These notes generated with voice recognition software. I apologize for typographical errors.  Zara Council, [facility] [address] [place], [place] [address] [phone]

## 2017-10-18 NOTE — Progress Notes (Signed)
ERROR

## 2017-10-24 ENCOUNTER — Other Ambulatory Visit: Payer: Self-pay | Admitting: Internal Medicine

## 2017-10-24 DIAGNOSIS — K2900 Acute gastritis without bleeding: Secondary | ICD-10-CM

## 2017-10-27 ENCOUNTER — Other Ambulatory Visit: Payer: Self-pay | Admitting: Internal Medicine

## 2017-10-27 DIAGNOSIS — K2900 Acute gastritis without bleeding: Secondary | ICD-10-CM

## 2017-11-06 ENCOUNTER — Telehealth: Payer: Self-pay | Admitting: Urology

## 2017-11-06 NOTE — Telephone Encounter (Signed)
Pt tried Retail [employer] and it didn't work for her.  She wants to know if the can try Detrol LA.  Please give pt a call.  [phone]

## 2017-11-06 NOTE — Telephone Encounter (Signed)
My understanding is that [name] worked the best for her.  Is she wanting to have Toviaz prescribed or the Detrol?

## 2017-11-07 NOTE — Telephone Encounter (Signed)
Pt doesn't want Toviaz.  She said it works well, but she can't stand the dry mouth.  She would like Detrol.

## 2017-11-07 NOTE — Telephone Encounter (Signed)
LMOM

## 2017-11-08 MED ORDER — TOLTERODINE TARTRATE ER 4 MG PO CP24: 4.0000 mg | ORAL_CAPSULE | Freq: Every day | ORAL | 3 refills | Status: DC

## 2017-11-08 NOTE — Telephone Encounter (Signed)
LMOM- medication was sent to pharmacy. Will need to make pt appt

## 2017-11-08 NOTE — Telephone Encounter (Signed)
She can try Detrol LA 4 mg daily.  She can have 30 days with 3 refills.  I will need to see her in 6 weeks.

## 2017-11-08 NOTE — Telephone Encounter (Signed)
6 weeks appt made.

## 2017-12-27 NOTE — Progress Notes (Deleted)
12/28/2017 9:06 PM   [name] 1971/10/04 [id number]  Referring provider: Jearld Fenton, NP [address] [place], [place] [address]  No chief complaint on file.   HPI: 47 yo WF with a mixed urinary incontinence and a cystocele who presents today for follow up after a trial of Myrbetriq 50 mg daily.  Background history Patient is a 79 -year-old Caucasian female who is referred to [place] by Dr. Hulan Fray for urinary incontinence.  Patient states that she has had urinary incontinence for two years ago.  Patient has incontinence with mild SUI and UI.   She is experiencing 8 or more incontinent episodes during the day. She is experiencing 1 incontinent episodes during the night.  Her incontinence volume is large.   She is wearing 4 to 5 pads/depends daily, thin panty liners.  She is having associated urinary frequency x 8, strong urgency and nocturia x 0-3.  She does restrict fluid and effort to improve leakage and she does engage with toilet mapping.  She does not have a history of urinary tract infections, STI's or injury to the bladder.   She denies dysuria, gross hematuria, suprapubic pain, back pain, abdominal pain or flank pain.  She has not had any recent fevers, chills, nausea or vomiting.   She does not have a history of nephrolithiasis, GU surgery or GU trauma.  She is sexually active.  She has noted incontinence with sexual intercourse.  She is not post menopausal.   She admits to loose stools.   She is not having pain with bladder filling.   A non contrast CT was performed on 07/26/2017 and it noted No renal or ureteral stone or obstruction.  Cholelithiasis without inflammatory changes.  Intrauterine device present.  Minimal umbilical hernia containing fat.  No bowel obstruction or inflammation. Surgical absence of the appendix.  She is drinking 2 bottles of water daily.   She is drinking two caffeinated beverages daily.  She is drinking not alcoholic beverages daily.  Her risk  factors for incontinence are obesity, parity, vaginal delivery, age, smoking, caffeine, diabetes and depression.  She is taking antidepressants.    She was initially given Toviaz samples, but she was experiencing extreme dry mouth. She then discontinue the Toviaz samples and started on Myrbetriq 25 mg daily.  She then called back requesting that the Myrbetriq be increased to 50 mg.    Today, she is experiencing urgency x 4-7, frequency x 4-7, not restricting fluids to avoid visits to the restroom, is engaging in toilet mapping, incontinence x 8 or more and nocturia x 0-3.  Her PVR is 44 mL.  Her BP is 120/81.   She is not experiencing dysuria, gross hematuria or suprapubic pain. She has not had recent fevers, chills, nausea or vomiting.  She did not feel that the Myrbetriq 50 mg daily was effective.  The leaking is so bad, it is causing skin breakdown.    The [name] is very effective for her, she states she was completely dry with that medication. The only reason why she stopped it was because of the extreme dry mouth.   PMH: Past Medical History:  Diagnosis Date  . Adopted   . Arthritis   . Depression   . Frequent headaches   . GERD (gastroesophageal reflux disease)     Surgical History: Past Surgical History:  Procedure Laterality Date  . APPENDECTOMY    . CESAREAN SECTION    . TUBAL LIGATION    .  WRIST SURGERY Left 11/21/2016   plates and screws inserted    Home Medications:  Allergies as of 12/28/2017   No Known Allergies     Medication List        Accurate as of 12/27/17  9:06 PM. Always use your most recent med list.          acidophilus Caps capsule Take 1 capsule by mouth daily.   amitriptyline 25 MG tablet Commonly known as:  ELAVIL Take 1 tablet (25 mg total) by mouth at bedtime.   buPROPion 150 MG 24 hr tablet Commonly known as:  WELLBUTRIN XL TAKE 1 TABLET BY MOUTH EVERY DAY. NEED TO SCHEDULE JANUARY APPOINTMENT.   levonorgestrel 20 MCG/24HR  IUD Commonly known as:  MIRENA 1 each by Intrauterine route once.   meloxicam 15 MG tablet Commonly known as:  MOBIC Take 1 tablet (15 mg total) by mouth daily.   mirabegron ER 50 MG Tb24 tablet Commonly known as:  MYRBETRIQ Take 1 tablet (50 mg total) by mouth daily.   omeprazole 20 MG capsule Commonly known as:  PRILOSEC Take 1 capsule (20 mg total) by mouth daily.   ondansetron 8 MG disintegrating tablet Commonly known as:  ZOFRAN ODT 8mg  ODT q8 hours prn nausea   polyethylene glycol packet Commonly known as:  MIRALAX Take 17 g by mouth daily.   ranitidine 300 MG tablet Commonly known as:  ZANTAC Take 1 tablet (300 mg total) by mouth at bedtime.   solifenacin 10 MG tablet Commonly known as:  VESICARE Take 1 tablet (10 mg total) by mouth daily.   tolterodine 4 MG 24 hr capsule Commonly known as:  DETROL LA Take 1 capsule (4 mg total) daily by mouth.       Allergies: No Known Allergies  Family History: Family History  Adopted: Yes  Family history unknown: Yes    Social History:  reports that she quit smoking about 3 years ago. Her smoking use included cigarettes. She smoked 0.10 packs per day. she has never used smokeless tobacco. She reports that she drinks alcohol. She reports that she does not use drugs.  ROS:                                        Physical Exam: There were no vitals taken for this visit.  Constitutional: Well nourished. Alert and oriented, No acute distress. HEENT: [place] AT, moist mucus membranes. Trachea midline, no masses. Cardiovascular: No clubbing, cyanosis, or edema. Respiratory: Normal respiratory effort, no increased work of breathing. Skin: No rashes, bruises or suspicious lesions. Lymph: No cervical or inguinal adenopathy. Neurologic: Grossly intact, no focal deficits, moving all 4 extremities. Psychiatric: Normal mood and affect.  Laboratory Data: Lab Results  Component Value Date   WBC 10.1  07/27/2017   HGB 15.0 07/27/2017   HCT 44.0 07/27/2017   MCV 93.7 07/27/2017   PLT 179 07/27/2017    Lab Results  Component Value Date   CREATININE 0.80 07/27/2017     Lab Results  Component Value Date   HGBA1C 5.2 12/23/2016       Component Value Date/Time   CHOL 169 12/23/2016 1459   HDL 48.80 12/23/2016 1459   CHOLHDL 3 12/23/2016 1459   VLDL 13.8 12/23/2016 1459   LDLCALC 106 (H) 12/23/2016 1459    Lab Results  Component Value Date   AST 49 (H) 07/27/2017  Lab Results  Component Value Date   ALT 25 07/27/2017    I have reviewed the labs.   Pertinent Imaging: Results for JAMYRIA, OZANICH (MRN [id number]) as of 10/18/2017 14:13  Ref. Range 10/18/2017 14:07  Scan Result Unknown 44    Assessment & Plan:    1. Mixed Incontinence  - offered behavioral therapies, bladder training and bladder control strategies  - pelvic floor muscle training - patient doing Kegel's at home  - fluid management   - Seems to respond well to anticholinergic therapy, but cannot tolerate side effects - we will try Vesicare 10 mg daily to see if she gets the same urinary control but without the intolerable dry mouth - she will call back in 2 weeks to tell us her status and if the Vesicare is not effective we will go on to the next anticholinergic and until we have exhausted them all  - Patient to call report experience with Vesicare  2. Urge incontinence  - most bothersome symptom  - see above  3. SUI  - mild at this time - will reassess in 3 weeks  4. Cystocele  - see above  No Follow-up on file.  These notes generated with voice recognition software. I apologize for typographical errors.  Zara Council, [facility] [address], [place], [place] [address] [phone]

## 2017-12-28 ENCOUNTER — Ambulatory Visit: Payer: Self-pay | Admitting: Urology

## 2017-12-28 ENCOUNTER — Encounter: Payer: Self-pay | Admitting: Urology

## 2017-12-29 ENCOUNTER — Other Ambulatory Visit: Payer: Self-pay | Admitting: Internal Medicine

## 2017-12-29 DIAGNOSIS — K2900 Acute gastritis without bleeding: Secondary | ICD-10-CM

## 2018-02-15 ENCOUNTER — Other Ambulatory Visit: Payer: Self-pay | Admitting: Internal Medicine

## 2018-02-15 DIAGNOSIS — K2900 Acute gastritis without bleeding: Secondary | ICD-10-CM

## 2018-02-21 ENCOUNTER — Ambulatory Visit: Payer: BLUE CROSS/BLUE SHIELD | Admitting: Family Medicine

## 2018-04-10 DIAGNOSIS — L03115 Cellulitis of right lower limb: Secondary | ICD-10-CM | POA: Diagnosis not present

## 2018-04-18 ENCOUNTER — Other Ambulatory Visit: Payer: Self-pay | Admitting: Family Medicine

## 2018-04-18 ENCOUNTER — Telehealth: Payer: Self-pay | Admitting: Internal Medicine

## 2018-04-18 ENCOUNTER — Encounter: Payer: Self-pay | Admitting: Family Medicine

## 2018-04-18 ENCOUNTER — Ambulatory Visit (INDEPENDENT_AMBULATORY_CARE_PROVIDER_SITE_OTHER): Payer: 59 | Admitting: Family Medicine

## 2018-04-18 VITALS — BP 118/80 | HR 80 | Temp 98.4°F | Resp 16 | Ht 63.0 in | Wt 255.9 lb

## 2018-04-18 DIAGNOSIS — Z131 Encounter for screening for diabetes mellitus: Secondary | ICD-10-CM | POA: Diagnosis not present

## 2018-04-18 DIAGNOSIS — Z803 Family history of malignant neoplasm of breast: Secondary | ICD-10-CM | POA: Diagnosis not present

## 2018-04-18 DIAGNOSIS — F330 Major depressive disorder, recurrent, mild: Secondary | ICD-10-CM | POA: Diagnosis not present

## 2018-04-18 DIAGNOSIS — G43109 Migraine with aura, not intractable, without status migrainosus: Secondary | ICD-10-CM | POA: Diagnosis not present

## 2018-04-18 DIAGNOSIS — Z13220 Encounter for screening for lipoid disorders: Secondary | ICD-10-CM

## 2018-04-18 DIAGNOSIS — M5136 Other intervertebral disc degeneration, lumbar region: Secondary | ICD-10-CM

## 2018-04-18 DIAGNOSIS — K219 Gastro-esophageal reflux disease without esophagitis: Secondary | ICD-10-CM | POA: Diagnosis not present

## 2018-04-18 DIAGNOSIS — M51369 Other intervertebral disc degeneration, lumbar region without mention of lumbar back pain or lower extremity pain: Secondary | ICD-10-CM

## 2018-04-18 LAB — COMPLETE METABOLIC PANEL WITH GFR
AG RATIO: 1.5 (calc) (ref 1.0–2.5)
ALBUMIN MSPROF: 4.1 g/dL (ref 3.6–5.1)
ALT: 12 U/L (ref 6–29)
AST: 14 U/L (ref 10–35)
Alkaline phosphatase (APISO): 57 U/L (ref 33–115)
BUN: 13 mg/dL (ref 7–25)
CALCIUM: 9.2 mg/dL (ref 8.6–10.2)
CO2: 29 mmol/L (ref 20–32)
CREATININE: 0.91 mg/dL (ref 0.50–1.10)
Chloride: 105 mmol/L (ref 98–110)
GFR, EST AFRICAN AMERICAN: 88 mL/min/{1.73_m2} (ref >=60)
GFR, EST NON AFRICAN AMERICAN: 76 mL/min/{1.73_m2} (ref >=60)
GLOBULIN: 2.7 g/dL (ref 1.9–3.7)
Glucose, Bld: 74 mg/dL (ref 65–139)
POTASSIUM: 4.2 mmol/L (ref 3.5–5.3)
SODIUM: 138 mmol/L (ref 135–146)
TOTAL PROTEIN: 6.8 g/dL (ref 6.1–8.1)
Total Bilirubin: 0.6 mg/dL (ref 0.2–1.2)

## 2018-04-18 LAB — CBC WITH DIFFERENTIAL/PLATELET
BASOS ABS: 57 {cells}/uL (ref 0–200)
BASOS PCT: 1.1 %
EOS ABS: 88 {cells}/uL (ref 15–500)
Eosinophils Relative: 1.7 %
HCT: 45.6 % — ABNORMAL HIGH (ref 35.0–45.0)
Hemoglobin: 15.7 g/dL — ABNORMAL HIGH (ref 11.7–15.5)
Lymphs Abs: 1570 cells/uL (ref 850–3900)
MCH: 31.8 pg (ref 27.0–33.0)
MCHC: 34.4 g/dL (ref 32.0–36.0)
MCV: 92.3 fL (ref 80.0–100.0)
MONOS PCT: 8 %
MPV: 11.3 fL (ref 7.5–12.5)
Neutro Abs: 3068 cells/uL (ref 1500–7800)
Neutrophils Relative %: 59 %
PLATELETS: 195 10*3/uL (ref 140–400)
RBC: 4.94 10*6/uL (ref 3.80–5.10)
RDW: 11.7 % (ref 11.0–15.0)
TOTAL LYMPHOCYTE: 30.2 %
WBC: 5.2 10*3/uL (ref 3.8–10.8)
WBCMIX: 416 {cells}/uL (ref 200–950)

## 2018-04-18 LAB — LIPID PANEL
CHOL/HDL RATIO: 3.3 (calc) (ref <5.0)
CHOLESTEROL: 180 mg/dL (ref <200)
HDL: 54 mg/dL (ref >50)
LDL CHOLESTEROL (CALC): 103 mg/dL — AB
Non-HDL Cholesterol (Calc): 126 mg/dL (calc) (ref <130)
Triglycerides: 131 mg/dL (ref <150)

## 2018-04-18 LAB — TSH: TSH: 2.49 m[IU]/L

## 2018-04-18 MED ORDER — OMEPRAZOLE 20 MG PO CPDR: 20.0000 mg | DELAYED_RELEASE_CAPSULE | Freq: Every day | ORAL | 0 refills | Status: DC

## 2018-04-18 MED ORDER — AMITRIPTYLINE HCL 25 MG PO TABS: 25.0000 mg | ORAL_TABLET | Freq: Every day | ORAL | 1 refills | Status: DC

## 2018-04-18 MED ORDER — DULOXETINE HCL 30 MG PO CPEP: 30.0000 mg | ORAL_CAPSULE | Freq: Every day | ORAL | 0 refills | Status: DC

## 2018-04-18 MED ORDER — MELOXICAM 15 MG PO TABS: 7.5000 mg | ORAL_TABLET | Freq: Every day | ORAL | 0 refills | Status: DC

## 2018-04-18 MED ORDER — RANITIDINE HCL 300 MG PO TABS: 300.0000 mg | ORAL_TABLET | Freq: Every day | ORAL | 0 refills | Status: DC | PRN

## 2018-04-18 NOTE — Telephone Encounter (Signed)
Copied from [place] [phone]. Topic: Quick Communication - See Telephone Encounter >> Apr 18, 2018 12:26 PM Bea Graff, NT wrote: CRM for notification. See Telephone encounter for: 04/18/18. Pt wanting to talk to Dr. Ancil Boozer nurse or Jenny Reichmann in the lab in regards to the lab she had drawn today to check for possibility of breast cancer due to family history. Pt calling with a follow-up from her insurance.
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
## 2018-04-18 NOTE — Patient Instructions (Signed)
Binge-Eating Disorder Binge-eating disorder is an eating disorder that involves repeated episodes of binge eating. Binge eating refers to eating a larger-than-normal amount of food in a short period of time. People with binge-eating disorder feel unable to control their eating. Although they feel bad about overeating, they usually do not try to make up for overeating. They usually do not make themselves vomit, use laxatives, starve themselves, or exercise too much. Binge-eating disorder usually starts in the teenage years or early 20s. It often gets worse with stress. What are the causes? The cause is unknown. What increases the risk? Risk factors include:  Being a teenager or in your early 20s.  Being female. Binge-eating disorder can affect males, but it is more common in females.  Being overweight or obese.  Having a mental health disorder, such as depression or anxiety.  Having a substance use disorder, such as alcohol use disorder.  What are the signs or symptoms? Symptoms may include:  Eating much more quickly than normal.  Eating to the point of feeling physically uncomfortable.  Eating large amounts of food when you are not hungry.  Eating alone because you are embarrassed by how much you are eating.  Feeling disgusted, depressed, or guilty after overeating.  How is this diagnosed? The diagnosis of binge-eating disorder is made through an assessment by your health care provider. You may be diagnosed with the disorder if you:  Binge eat at least once a week on average for three months or longer.  Have three or more of the symptoms of the disorder.  Once you have been diagnosed, your level of binge-eating disorder will be rated from mild to severe. The rating is based on how often you binge eat. How is this treated? Treatment is usually provided by mental health professionals, such as psychologists, psychiatrists, licensed professional counselors, and clinical social  workers. The following treatment options are available:  Cognitive behavioral therapy. This is a form of talk therapy that helps you recognize the thoughts, beliefs, and emotions that contribute to overeating and helps you change them.  Interpersonal psychotherapy. This is a form of talk therapy that focuses on fixing relationship problems that trigger binge-eating episodes.  Dialectical behavioral therapy. This is a form of talk therapy that helps you learn skills to control your emotions and tolerate distress without binge eating.  Medicine.  Weight-loss programs. These can be important if you are overweight. Losing excess weight can improve physical health and the way you feel about yourself.  Follow these instructions at home:  Keep all follow-up visits as directed by your health care provider. This is important.  Take medicines only as directed by your health care provider. Where to find more information: National Eating Disorders Association (NEDA): www.nationaleatingdisorders.org Contact a health care provider if:  Your symptoms get worse.  You start having new symptoms.  You start compensating for eating binges with harmful behaviors, such as: ? Making yourself vomit. ? Exercising too much. ? Using laxatives. Get help right away if: You have serious thoughts about hurting yourself or someone else. This information is not intended to replace advice given to you by your health care provider. Make sure you discuss any questions you have with your health care provider. Document Released: 05/04/2011 Document Revised: 05/19/2016 Document Reviewed: 03/24/2014 Elsevier Interactive Patient Education  2018 Elsevier Inc.  

## 2018-04-19 LAB — HEMOGLOBIN A1C
Hgb A1c MFr Bld: 5.1 % of total Hgb (ref <5.7)
Mean Plasma Glucose: 100 (calc)
eAG (mmol/L): 5.5 (calc)

## 2018-04-23 NOTE — Telephone Encounter (Signed)
Patient needs a prior authorization done for [employer]. Testing must be done before labs are drawn. I put forms on Latisha's desk to complete prior authorization twice. As soon as it is done patient needs to be notified and called to come in for a lab visit to get labs done. Paperwork that Kristeen Miss has needs to be signed by the patient and corrected by PCP.

## 2018-05-07 ENCOUNTER — Telehealth: Payer: Self-pay | Admitting: Family Medicine

## 2018-05-07 NOTE — Telephone Encounter (Signed)
Patient notified via voicemail Dr. Ancil Boozer is unable to fill Valtrex due to no history of fever blister on file.

## 2018-05-07 NOTE — Telephone Encounter (Signed)
Copied from [place] [phone] [phone]. Topic: Quick Communication - See Telephone Encounter >> May 07, 2018  3:45 PM Antonieta Iba C wrote: CRM for notification. See Telephone encounter for: 05/07/18.  Pt says that she has a fever blister coming up. Pt would like to know if provider would send in a Rx for Valtrex?   Please advise.  Pharmacy: [facility] [place], [place] [address] [phone] (Phone) [phone] (Fax)

## 2018-05-07 NOTE — Telephone Encounter (Signed)
Copied from [place] [id number]. Topic: Quick Communication - See Telephone Encounter >> May 07, 2018 12:06 PM Ether Griffins B wrote: CRM for notification. See Telephone encounter for: 05/07/18.  Esther with [facility] calling stating they got a request for the pt to have brca testing done. They state the request needs to go through [facility] with clinical information. And [facility] will [facility].

## 2018-05-07 NOTE — Telephone Encounter (Signed)
I just reviewed her chart, there is no history of fever blister on her records or that she ever took Valtrex. I am sorry. I would have filled otherwise. She can try Abreva otc .

## 2018-05-08 ENCOUNTER — Encounter: Payer: Self-pay | Admitting: Family Medicine

## 2018-05-08 DIAGNOSIS — Z800 Family history of malignant neoplasm of digestive organs: Secondary | ICD-10-CM | POA: Insufficient documentation

## 2018-05-08 DIAGNOSIS — Z803 Family history of malignant neoplasm of breast: Secondary | ICD-10-CM | POA: Insufficient documentation

## 2018-05-10 ENCOUNTER — Telehealth: Payer: Self-pay

## 2018-05-10 NOTE — Telephone Encounter (Signed)
I have tried to contact patient several times to inform her that she can come in to have her BRCA testing done. [facility] did not give [place] confirmation that the test would be covered after we did prior authorization. I did receive a message from Sherlynn Stalls from [facility] stating that they got a request for the patient to have testing done. They state the request needs to go through [facility] with clinical information and [facility] will send to [facility]. I have turned all of the paperwork over to [facility]. Some of the forms they have need your signature on them before they are sent. Please let us know if you still want to proceed with this testing.

## 2018-05-14 ENCOUNTER — Encounter: Payer: Self-pay | Admitting: Family Medicine

## 2018-05-14 ENCOUNTER — Ambulatory Visit
Admission: RE | Admit: 2018-05-14 | Discharge: 2018-05-14 | Disposition: A | Discharge status: Home / Self Care | Payer: 59 | Source: Ambulatory Visit | Attending: Family Medicine | Admitting: Family Medicine

## 2018-05-14 ENCOUNTER — Ambulatory Visit: Payer: 59 | Admitting: Family Medicine

## 2018-05-14 VITALS — BP 126/84 | HR 98 | Temp 99.1°F | Resp 14 | Ht 63.0 in | Wt 255.6 lb

## 2018-05-14 DIAGNOSIS — R05 Cough: Secondary | ICD-10-CM | POA: Insufficient documentation

## 2018-05-14 DIAGNOSIS — Z87891 Personal history of nicotine dependence: Secondary | ICD-10-CM | POA: Diagnosis not present

## 2018-05-14 DIAGNOSIS — R0602 Shortness of breath: Secondary | ICD-10-CM | POA: Diagnosis not present

## 2018-05-14 DIAGNOSIS — R059 Cough, unspecified: Secondary | ICD-10-CM

## 2018-05-14 DIAGNOSIS — J3489 Other specified disorders of nose and nasal sinuses: Secondary | ICD-10-CM

## 2018-05-14 MED ORDER — ALBUTEROL SULFATE HFA 108 (90 BASE) MCG/ACT IN AERS: 2.0000 | INHALATION_SPRAY | RESPIRATORY_TRACT | 0 refills | Status: DC | PRN

## 2018-05-14 MED ORDER — FEXOFENADINE HCL 180 MG PO TABS: 180.0000 mg | ORAL_TABLET | Freq: Every day | ORAL | 1 refills | Status: DC | PRN

## 2018-05-14 MED ORDER — HYDROCOD POLST-CPM POLST ER 10-8 MG/5ML PO SUER: 5.0000 mL | Freq: Two times a day (BID) | ORAL | 0 refills | Status: DC | PRN

## 2018-05-14 NOTE — Patient Instructions (Addendum)
Please have the chest xray done across the street Use the new medicines for symptoms Do not take any other cough medicines except for PLAIN mucinex (generic is fine) Plain non-drowsy anti-histamines Rest and hydration   Acute Bronchitis, Adult Acute bronchitis is sudden (acute) swelling of the air tubes (bronchi) in the lungs. Acute bronchitis causes these tubes to fill with mucus, which can make it hard to breathe. It can also cause coughing or wheezing. In adults, acute bronchitis usually goes away within 2 weeks. A cough caused by bronchitis may last up to 3 weeks. Smoking, allergies, and asthma can make the condition worse. Repeated episodes of bronchitis may cause further lung problems, such as chronic obstructive pulmonary disease (COPD). What are the causes? This condition can be caused by germs and by substances that irritate the lungs, including:  Cold and flu viruses. This condition is most often caused by the same virus that causes a cold.  Bacteria.  Exposure to tobacco smoke, dust, fumes, and air pollution.  What increases the risk? This condition is more likely to develop in people who:  Have close contact with someone with acute bronchitis.  Are exposed to lung irritants, such as tobacco smoke, dust, fumes, and vapors.  Have a weak immune system.  Have a respiratory condition such as asthma.  What are the signs or symptoms? Symptoms of this condition include:  A cough.  Coughing up clear, yellow, or green mucus.  Wheezing.  Chest congestion.  Shortness of breath.  A fever.  Body aches.  Chills.  A sore throat.  How is this diagnosed? This condition is usually diagnosed with a physical exam. During the exam, your health care provider may order tests, such as chest X-rays, to rule out other conditions. He or she may also:  Test a sample of your mucus for bacterial infection.  Check the level of oxygen in your blood. This is done to check for  pneumonia.  Do a chest X-ray or lung function testing to rule out pneumonia and other conditions.  Perform blood tests.  Your health care provider will also ask about your symptoms and medical history. How is this treated? Most cases of acute bronchitis clear up over time without treatment. Your health care provider may recommend:  Drinking more fluids. Drinking more makes your mucus thinner, which may make it easier to breathe.  Taking a medicine for a fever or cough.  Taking an antibiotic medicine.  Using an inhaler to help improve shortness of breath and to control a cough.  Using a cool mist vaporizer or humidifier to make it easier to breathe.  Follow these instructions at home: Medicines  Take over-the-counter and prescription medicines only as told by your health care provider.  If you were prescribed an antibiotic, take it as told by your health care provider. Do not stop taking the antibiotic even if you start to feel better. General instructions  Get plenty of rest.  Drink enough fluids to keep your urine clear or pale yellow.  Avoid smoking and secondhand smoke. Exposure to cigarette smoke or irritating chemicals will make bronchitis worse. If you smoke and you need help quitting, ask your health care provider. Quitting smoking will help your lungs heal faster.  Use an inhaler, cool mist vaporizer, or humidifier as told by your health care provider.  Keep all follow-up visits as told by your health care provider. This is important. How is this prevented? To lower your risk of getting this condition again:  Wash your hands often with soap and water. If soap and water are not available, use hand sanitizer.  Avoid contact with people who have cold symptoms.  Try not to touch your hands to your mouth, nose, or eyes.  Make sure to get the flu shot every year.  Contact a health care provider if:  Your symptoms do not improve in 2 weeks of treatment. Get help  right away if:  You cough up blood.  You have chest pain.  You have severe shortness of breath.  You become dehydrated.  You faint or keep feeling like you are going to faint.  You keep vomiting.  You have a severe headache.  Your fever or chills gets worse. This information is not intended to replace advice given to you by your health care provider. Make sure you discuss any questions you have with your health care provider. Document Released: 01/19/2005 Document Revised: 07/06/2016 Document Reviewed: 06/01/2016 Elsevier Interactive Patient Education  [employer].

## 2018-05-14 NOTE — Progress Notes (Addendum)
BP 126/84   Pulse 98   Temp 99.1 F (37.3 C) (Oral)   Resp 14   Ht 5\' 3"  (1.6 m)   Wt 255 lb 9.6 oz (115.9 kg)   SpO2 95%   BMI 45.28 kg/m    Subjective:    Patient ID: [name], female    DOB: 05-31-1971, 47 y.o.   MRN: [id number]  HPI: [name] is a 47 y.o. female  Chief Complaint  Patient presents with  . URI    onset 1 day symptoms include: cough, chest congetion and tightness, sob, sore throat, headache and runny nose    HPI Here for a sick visit; usual provider not available Coughing up yellow phlegm; having some wheezing and shortness of breath Hx of pneumonia Just started yesterday morning Sore throat; just tight; not really sore Headache is from all the coughing; feels like really big Nose is really running; postnasal drip Sweaty, but no measured temperature Took delsym and benzonatate and that didn't help either Using sore throat spray Drinking water, getting enough hydration; no N/V/D GERD; alternates H2 blocker and PPI Tick bite last month; on doxy last month  Depression screen [facility] 2/9 05/14/2018 04/18/2018 08/07/2017 12/23/2016 01/20/2016  Decreased Interest 0 0 0 0 2  Down, Depressed, Hopeless 0 1 1 0 2  PHQ - 2 Score 0 1 1 0 4  Altered sleeping - 1 1 - 3  Tired, decreased energy - 1 1 - 3  Change in appetite - 1 0 - 3  Feeling bad or failure about yourself  - 1 0 - 1  Trouble concentrating - 0 1 - 0  Moving slowly or fidgety/restless - 0 0 - 0  Suicidal thoughts - 0 0 - 0  PHQ-9 Score - 5 4 - 14  Difficult doing work/chores - Somewhat difficult Somewhat difficult - -    Relevant past medical, surgical, family and social history reviewed Past Medical History:  Diagnosis Date  . Adopted   . Arthritis   . Depression   . Frequent headaches   . GERD (gastroesophageal reflux disease)    Past Surgical History:  Procedure Laterality Date  . APPENDECTOMY    . CESAREAN SECTION    . FRACTURE SURGERY  10/2016   Left wrist fracture  surgery  . TUBAL LIGATION    . WRIST SURGERY Left 11/21/2016   plates and screws inserted   Family History  Problem Relation Age of Onset  . Cancer - Lung Father   . Breast cancer Mother   . Colon cancer Mother   . ADD / ADHD Son   . ADD / ADHD Son   . Early death Maternal Aunt   . Breast cancer Maternal Aunt   . Breast cancer Maternal Grandmother   . Depression Daughter   . Depression Daughter    Social History   Tobacco Use  . Smoking status: Former Smoker    Packs/day: 0.50    Years: 20.00    Pack years: 10.00    Types: Cigarettes    Last attempt to quit: 12/26/2014    Years since quitting: 3.3  . Smokeless tobacco: Never Used  Substance Use Topics  . Alcohol use: Yes    Alcohol/week: 0.0 oz    Comment: Socially  . Drug use: No   Interim medical history since last visit reviewed. Allergies and medications reviewed  Review of Systems Per HPI unless specifically indicated above     Objective:  BP 126/84   Pulse 98   Temp 99.1 F (37.3 C) (Oral)   Resp 14   Ht 5\' 3"  (1.6 m)   Wt 255 lb 9.6 oz (115.9 kg)   SpO2 95%   BMI 45.28 kg/m   Wt Readings from Last 3 Encounters:  05/14/18 255 lb 9.6 oz (115.9 kg)  04/18/18 255 lb 14.4 oz (116.1 kg)  10/18/17 258 lb 4.8 oz (117.2 kg)    Physical Exam  Constitutional: She appears well-developed and well-nourished. No distress (but frequently coughing; mask).  HENT:  Right Ear: Tympanic membrane and ear canal normal.  Left Ear: Tympanic membrane and ear canal normal.  Nose: Rhinorrhea (clear) present.  Mouth/Throat: Mucous membranes are normal. No posterior oropharyngeal edema or posterior oropharyngeal erythema.  Cardiovascular: Normal rate and regular rhythm.  Pulmonary/Chest: No accessory muscle usage. No tachypnea. No respiratory distress. She has no decreased breath sounds. She has no wheezes. She has no rhonchi.  Frequent coughing  Lymphadenopathy:    She has no cervical adenopathy.  Skin: She is not  diaphoretic. No pallor.      Assessment & Plan:   Problem List Items Addressed This Visit    None    Visit Diagnoses    Cough    -  Primary   will get CXR given hx of pneumonia; advised likely viral in the meantime, use SABA, tussionex for severe coughing fits; do NOT mix w/other agents, discussed   Relevant Orders   DG Chest 2 View   Rhinorrhea       Rx for allegra per her request so she can use her savings card; avoid benadryl which is sedating with the tussionex; she agrees      Encouraged patient to go to ER if worse  Follow up plan: No follow-ups on file.  An after-visit summary was printed and given to the patient at [place].  Please see the patient instructions which may contain other information and recommendations beyond what is mentioned above in the assessment and plan.  Meds ordered this encounter  Medications  . chlorpheniramine-HYDROcodone (TUSSIONEX PENNKINETIC ER) 10-8 MG/5ML SUER    Sig: Take 5 mLs by mouth every 12 (twelve) hours as needed for cough.    Dispense:  115 mL    Refill:  0  . albuterol (PROVENTIL HFA;VENTOLIN HFA) 108 (90 Base) MCG/ACT inhaler    Sig: Inhale 2 puffs into the lungs every 4 (four) hours as needed for wheezing or shortness of breath.    Dispense:  1 Inhaler    Refill:  0  . fexofenadine (ALLEGRA ALLERGY) 180 MG tablet    Sig: Take 1 tablet (180 mg total) by mouth daily as needed for allergies or rhinitis.    Dispense:  30 tablet    Refill:  1    Orders Placed This Encounter  Procedures  . DG Chest 2 View    I did check PMP Aware PRIOR to the prescription for Tussionex; no recent fills; discussed with patient

## 2018-05-15 ENCOUNTER — Telehealth: Payer: Self-pay | Admitting: Family Medicine

## 2018-05-15 NOTE — Telephone Encounter (Signed)
Copied from [place] [id number]. Topic: Quick Communication - See Telephone Encounter >> May 15, 2018  2:22 PM Bea Graff, NT wrote: CRM for notification. See Telephone encounter for: 05/15/18. Ana with [employer] is calling that they need a sample for BRCA test. Insurance approved the request but they need a new sample for the test. CB#: [phone]

## 2018-05-16 NOTE — Telephone Encounter (Signed)
I called this patient yesterday and left a vm informing her that the BRCA test was approved by her insurance and she needs to come in to get her labs done. I have reached out to this patient numerous times about the BRCA testing and I have not gotten a response.

## 2018-05-17 ENCOUNTER — Other Ambulatory Visit: Payer: Self-pay | Admitting: Family Medicine

## 2018-05-17 ENCOUNTER — Ambulatory Visit (INDEPENDENT_AMBULATORY_CARE_PROVIDER_SITE_OTHER): Payer: 59 | Admitting: Family Medicine

## 2018-05-17 ENCOUNTER — Encounter: Payer: Self-pay | Admitting: Family Medicine

## 2018-05-17 VITALS — BP 110/80 | HR 90 | Resp 16 | Ht 63.0 in | Wt 250.1 lb

## 2018-05-17 DIAGNOSIS — Z803 Family history of malignant neoplasm of breast: Secondary | ICD-10-CM | POA: Diagnosis not present

## 2018-05-17 DIAGNOSIS — M5136 Other intervertebral disc degeneration, lumbar region: Secondary | ICD-10-CM

## 2018-05-17 DIAGNOSIS — L719 Rosacea, unspecified: Secondary | ICD-10-CM | POA: Diagnosis not present

## 2018-05-17 DIAGNOSIS — F330 Major depressive disorder, recurrent, mild: Secondary | ICD-10-CM | POA: Diagnosis not present

## 2018-05-17 DIAGNOSIS — Z800 Family history of malignant neoplasm of digestive organs: Secondary | ICD-10-CM | POA: Diagnosis not present

## 2018-05-17 DIAGNOSIS — B001 Herpesviral vesicular dermatitis: Secondary | ICD-10-CM | POA: Diagnosis not present

## 2018-05-17 MED ORDER — VALACYCLOVIR HCL 1 G PO TABS: 1000.0000 mg | ORAL_TABLET | Freq: Two times a day (BID) | ORAL | 0 refills | Status: DC

## 2018-05-17 MED ORDER — METRONIDAZOLE 1 % EX GEL: Freq: Every day | CUTANEOUS | 0 refills | Status: DC

## 2018-05-17 MED ORDER — DULOXETINE HCL 60 MG PO CPEP: 60.0000 mg | ORAL_CAPSULE | Freq: Every day | ORAL | 0 refills | Status: DC
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
## 2018-05-30 ENCOUNTER — Telehealth: Payer: Self-pay

## 2018-05-30 NOTE — Telephone Encounter (Signed)
Where is the result?

## 2018-05-30 NOTE — Telephone Encounter (Signed)
Copied from [place] [id number]. Topic: Quick Communication - Lab Results >> May 30, 2018 11:35 AM Lennox Solders wrote: Pt is calling and would like the results of bracanalysis test

## 2018-05-31 LAB — BRCAVANTAGE, COMPREHENSIVE
BRCA1 Del/Dup Interp: NOT DETECTED
BRCA1 Del/Dup: NEGATIVE
BRCA2 DEL/DUP INTERP (BRCAVANTAGE): NOT DETECTED
BRCA2 Del/Dup: NEGATIVE
BRCA2 SEQUENCING (BRCAVANTAGE): NEGATIVE
BRCA2 Seq Intrerp: NOT DETECTED

## 2018-05-31 NOTE — Telephone Encounter (Signed)
This Bracanalysis test takes 14 days after the facility receives it in [place]. The test was received on 05/23/18 and only tested on M, W,F's. Expected to result out on June 12th. Patient was left a message with all this information.

## 2018-05-31 NOTE — Telephone Encounter (Signed)
Going to ask Jenny Reichmann [employer]) if she knows where the results are.

## 2018-06-01 ENCOUNTER — Telehealth: Payer: Self-pay

## 2018-06-01 ENCOUNTER — Other Ambulatory Visit: Payer: Self-pay | Admitting: Family Medicine

## 2018-06-01 DIAGNOSIS — R898 Other abnormal findings in specimens from other organs, systems and tissues: Secondary | ICD-10-CM

## 2018-06-01 NOTE — Telephone Encounter (Signed)
See Labs for BRCA testing. [facility] called with critical results.

## 2018-06-03 DIAGNOSIS — S86911A Strain of unspecified muscle(s) and tendon(s) at lower leg level, right leg, initial encounter: Secondary | ICD-10-CM | POA: Diagnosis not present

## 2018-06-03 DIAGNOSIS — R0781 Pleurodynia: Secondary | ICD-10-CM | POA: Diagnosis not present

## 2018-06-03 DIAGNOSIS — M79604 Pain in right leg: Secondary | ICD-10-CM | POA: Diagnosis not present

## 2018-06-03 DIAGNOSIS — M79674 Pain in right toe(s): Secondary | ICD-10-CM | POA: Diagnosis not present

## 2018-06-03 DIAGNOSIS — M79605 Pain in left leg: Secondary | ICD-10-CM | POA: Diagnosis not present

## 2018-06-03 DIAGNOSIS — S8991XA Unspecified injury of right lower leg, initial encounter: Secondary | ICD-10-CM | POA: Diagnosis not present

## 2018-06-05 ENCOUNTER — Telehealth: Payer: Self-pay | Admitting: Genetic Counselor

## 2018-06-05 ENCOUNTER — Encounter: Payer: Self-pay | Admitting: Genetic Counselor

## 2018-06-05 NOTE — Telephone Encounter (Signed)
New genetic counseling appt has been scheduled for the pt to see Steele Berg on 6/24 at 930am. Pt aware to arrive 30 minutes early. Letter mailed.

## 2018-06-16 ENCOUNTER — Emergency Department (HOSPITAL_COMMUNITY)
Admission: EM | Admit: 2018-06-16 | Discharge: 2018-06-16 | Disposition: A | Discharge status: Home / Self Care | Payer: 59 | Attending: Emergency Medicine | Admitting: Emergency Medicine

## 2018-06-16 ENCOUNTER — Encounter (HOSPITAL_COMMUNITY): Payer: Self-pay

## 2018-06-16 ENCOUNTER — Emergency Department (HOSPITAL_COMMUNITY): Payer: 59

## 2018-06-16 ENCOUNTER — Other Ambulatory Visit: Payer: Self-pay

## 2018-06-16 DIAGNOSIS — Y998 Other external cause status: Secondary | ICD-10-CM | POA: Insufficient documentation

## 2018-06-16 DIAGNOSIS — Y939 Activity, unspecified: Secondary | ICD-10-CM | POA: Diagnosis not present

## 2018-06-16 DIAGNOSIS — S81011A Laceration without foreign body, right knee, initial encounter: Secondary | ICD-10-CM | POA: Diagnosis not present

## 2018-06-16 DIAGNOSIS — F17210 Nicotine dependence, cigarettes, uncomplicated: Secondary | ICD-10-CM | POA: Insufficient documentation

## 2018-06-16 DIAGNOSIS — Z23 Encounter for immunization: Secondary | ICD-10-CM | POA: Insufficient documentation

## 2018-06-16 DIAGNOSIS — Y929 Unspecified place or not applicable: Secondary | ICD-10-CM | POA: Diagnosis not present

## 2018-06-16 DIAGNOSIS — W01198A Fall on same level from slipping, tripping and stumbling with subsequent striking against other object, initial encounter: Secondary | ICD-10-CM | POA: Insufficient documentation

## 2018-06-16 DIAGNOSIS — Z79899 Other long term (current) drug therapy: Secondary | ICD-10-CM | POA: Diagnosis not present

## 2018-06-16 MED ORDER — CEPHALEXIN 500 MG PO CAPS: 500.0000 mg | ORAL_CAPSULE | Freq: Four times a day (QID) | ORAL | 0 refills | Status: DC

## 2018-06-16 MED ORDER — LIDOCAINE-EPINEPHRINE (PF) 2 %-1:200000 IJ SOLN
20.0000 mL | Freq: Once | INTRAMUSCULAR | Status: AC
  Administered 2018-06-16: 20 mL via INTRADERMAL
  Filled 2018-06-16: qty 20

## 2018-06-16 MED ORDER — OXYCODONE-ACETAMINOPHEN 5-325 MG PO TABS
1.0000 | ORAL_TABLET | Freq: Once | ORAL | Status: AC
  Administered 2018-06-16: 1 via ORAL
  Filled 2018-06-16: qty 1

## 2018-06-16 MED ORDER — FLUCONAZOLE 150 MG PO TABS: 150.0000 mg | ORAL_TABLET | Freq: Every day | ORAL | 0 refills | Status: AC

## 2018-06-16 MED ORDER — HYDROCODONE-ACETAMINOPHEN 5-325 MG PO TABS: 1.0000 | ORAL_TABLET | Freq: Four times a day (QID) | ORAL | 0 refills | Status: DC | PRN

## 2018-06-16 NOTE — ED Notes (Signed)
Patient transported to X-ray 

## 2018-06-16 NOTE — ED Provider Notes (Signed)
[facility] EMERGENCY DEPARTMENT Provider Note   CSN: [id number] Arrival date & time: 06/16/18  1351     History   Chief Complaint Chief Complaint  Patient presents with  . Laceration  . Knee Pain    HPI [name] is a 47 y.o. female.  HPI   [name] is a 47yo female with a history of depression and GERD who presents to the emergency department for evaluation of right knee laceration.  Patient reports that her flip-flop got caught and she tripped and fell forward hitting her right knee on the edge of a brick stair about 20 minutes prior to arrival.  She denies hitting her head or loss of consciousness.  Noticed immediate bleeding over the knee, which she controlled with pressure.  She reports that she has 8/10 severity throbbing pain over the knee joint which is worsened with flexion.  Improved with right knee elevation.  She has not taken any medication prior to arrival.  Denies fevers, chills, numbness, weakness, joint swelling, pain elsewhere.  Is able to ambulate independently, although painful.  She called her doctor's office and reports last tetanus vaccine 2015.  Past Medical History:  Diagnosis Date  . Adopted   . Arthritis   . Depression   . Frequent headaches   . GERD (gastroesophageal reflux disease)     Patient Active Problem List   Diagnosis Date Noted  . Fever blister 05/17/2018  . Family history of breast cancer 05/08/2018  . Family history of colon cancer in mother 05/08/2018  . Migraine with aura and without status migrainosus 04/18/2018  . Dysmenorrhea 06/21/2017  . Menorrhagia 06/21/2017  . Mild recurrent major depression ([place]) 01/20/2016  . Esophageal reflux 01/20/2016  . Osteoarthritis, multiple sites 01/20/2016  . Frequent headaches 01/20/2016    Past Surgical History:  Procedure Laterality Date  . APPENDECTOMY    . CESAREAN SECTION    . FRACTURE SURGERY  10/2016   Left wrist fracture surgery  . TUBAL LIGATION      . WRIST SURGERY Left 11/21/2016   plates and screws inserted     OB History    Gravida  5   Para  4   Term  4   Preterm      AB  1   Living  4     SAB      TAB  1   Ectopic      Multiple      Live Births  4            Home Medications    Prior to Admission medications   Medication Sig Start Date End Date Taking? Authorizing Provider  albuterol (PROVENTIL HFA;VENTOLIN HFA) 108 (90 Base) MCG/ACT inhaler Inhale 2 puffs into the lungs every 4 (four) hours as needed for wheezing or shortness of breath. 05/14/18   Lada, Satira Anis, MD  amitriptyline (ELAVIL) 25 MG tablet Take 1 tablet (25 mg total) by mouth at bedtime. 04/18/18   Steele Sizer, MD  chlorpheniramine-HYDROcodone (TUSSIONEX PENNKINETIC ER) 10-8 MG/5ML SUER Take 5 mLs by mouth every 12 (twelve) hours as needed for cough. 05/14/18   Lada, Satira Anis, MD  DULoxetine (CYMBALTA) 60 MG capsule Take 1 capsule (60 mg total) by mouth daily. 30 mg first week after that stay on 60 mg 05/17/18   Steele Sizer, MD  fexofenadine [facility] ALLERGY) 180 MG tablet Take 1 tablet (180 mg total) by mouth daily as needed for allergies or rhinitis. 05/14/18  Arnetha Courser, MD  levonorgestrel (MIRENA) 20 MCG/24HR IUD 1 each by Intrauterine route once.    [provider]  meloxicam (MOBIC) 15 MG tablet Take 0.5-1 tablets (7.5-15 mg total) by mouth daily. 04/18/18   Steele Sizer, MD  metroNIDAZOLE (METROGEL) 1 % gel Apply topically at bedtime. 05/17/18   Steele Sizer, MD  omeprazole (PRILOSEC) 20 MG capsule Take 1 capsule (20 mg total) by mouth daily. 04/18/18   Steele Sizer, MD  ranitidine (ZANTAC) 300 MG tablet Take 1 tablet (300 mg total) by mouth daily as needed for heartburn. 04/18/18   Steele Sizer, MD  valACYclovir (VALTREX) 1000 MG tablet Take 1 tablet (1,000 mg total) by mouth 2 (two) times daily. 05/17/18   Steele Sizer, MD    Family History Family History  Problem Relation Age of Onset  . Cancer -  Lung Father   . Breast cancer Mother   . Colon cancer Mother   . ADD / ADHD Son   . ADD / ADHD Son   . Early death Maternal Aunt   . Breast cancer Maternal Aunt   . Breast cancer Maternal Grandmother   . Depression Daughter   . Depression Daughter     Social History Social History   Tobacco Use  . Smoking status: Current Every Day Smoker    Packs/day: 0.50    Years: 20.00    Pack years: 10.00    Types: Cigarettes  . Smokeless tobacco: Never Used  Substance Use Topics  . Alcohol use: Yes    Alcohol/week: 0.0 oz    Comment: Socially  . Drug use: No     Allergies   Patient has no known allergies.   Review of Systems Review of Systems  Constitutional: Negative for chills and fever.  Musculoskeletal: Positive for arthralgias (right knee). Negative for gait problem and joint swelling.  Skin: Positive for wound (right knee). Negative for color change.  Neurological: Negative for weakness and numbness.  All other systems reviewed and are negative.    Physical Exam Updated Vital Signs BP 129/68   Pulse 74   Temp 98.3 F (36.8 C) (Oral)   Resp 16   SpO2 95%   Physical Exam  Constitutional: She is oriented to person, place, and time. She appears well-developed and well-nourished. No distress.  Sitting at bedside in no apparent distress, nontoxic-appearing.  HENT:  Head: Normocephalic and atraumatic.  Eyes: Right eye exhibits no discharge. Left eye exhibits no discharge.  Neck: Normal range of motion.  Cardiovascular: Normal rate and regular rhythm.  Pulmonary/Chest: Effort normal. No respiratory distress.  Abdominal: Soft. There is no tenderness.  Neurological: She is alert and oriented to person, place, and time. Coordination normal.  Skin: Skin is warm and dry. She is not diaphoretic.  4 cm horizontal laceration over the right patella which is 64mm deep and has adipose tissue exposed. Laceration does not appear to enter joint capsule. See picture below.  No  surrounding erythema, warmth or induration.  Full knee flexion/extension.  No varus or valgus laxity.  Negative drawer's.  DP pulses 2+ and symmetric bilaterally.  Distal sensation to light touch intact beyond the injury.  Psychiatric: She has a normal mood and affect. Her behavior is normal.  Nursing note and vitals reviewed.      ED Treatments / Results  Labs (all labs ordered are listed, but only abnormal results are displayed) Labs Reviewed - No data to display  EKG None  Radiology Dg Knee Complete 4  Views Right  Result Date: 06/16/2018 CLINICAL DATA:  47 year old female with history of trauma from a fall today with laceration near the patella. EXAM: RIGHT KNEE - COMPLETE 4+ VIEW COMPARISON:  No priors. FINDINGS: Soft tissue defect superficial to the patella, compatible with the reported laceration. No retained radiopaque foreign body in the soft tissues. Multiple views of the right knee demonstrate no acute displaced fracture, subluxation or dislocation. IMPRESSION: 1. No evidence of acute osseous trauma to the right knee. 2. Superficial laceration anterior to the patella with no retained radiopaque foreign body in the soft tissues. Electronically Signed   By: Vinnie Langton M.D.   On: 06/16/2018 15:36    Procedures .Marland KitchenLaceration Repair Date/Time: 06/16/2018 4:54 PM Performed by: Glyn Ade, PA-C Authorized by: Glyn Ade, PA-C   Consent:    Consent obtained:  Verbal and emergent situation   Consent given by:  Patient   Risks discussed:  Infection, pain, poor cosmetic result, poor wound healing, tendon damage and need for additional repair   Alternatives discussed:  No treatment Anesthesia (see MAR for exact dosages):    Anesthesia method:  Local infiltration   Local anesthetic:  Lidocaine 2% WITH epi Laceration details:    Location:  Leg   Leg location:  R knee   Length (cm):  4   Depth (mm):  10 Pre-procedure details:    Preparation:  Patient was  prepped and draped in usual sterile fashion and imaging obtained to evaluate for foreign bodies Exploration:    Hemostasis achieved with:  Direct pressure   Wound exploration: wound explored through full range of motion and entire depth of wound probed and visualized     Contaminated: no   Treatment:    Area cleansed with:  Betadine   Amount of cleaning:  Extensive   Irrigation solution:  Sterile saline   Irrigation volume:  1000   Irrigation method:  Pressure wash Skin repair:    Repair method:  Sutures   Suture size:  4-0   Suture material:  Prolene   Suture technique: 6 simple interrupted, 3 vertical mattress.   Number of sutures:  9 Approximation:    Approximation:  Close Post-procedure details:    Dressing: Non-adherent dressing and knee immobilizer.   Patient tolerance of procedure:  Tolerated well, no immediate complications   (including critical care time)  Medications Ordered in ED Medications  oxyCODONE-acetaminophen (PERCOCET/ROXICET) 5-325 MG per tablet 1 tablet (1 tablet Oral Given 06/16/18 1407)  lidocaine-EPINEPHrine (XYLOCAINE W/EPI) 2 %-1:200000 (PF) injection 20 mL (20 mLs Intradermal Given 06/16/18 1640)     Initial Impression / Assessment and Plan / ED Course  I have reviewed the triage vital signs and the nursing notes.  Pertinent labs & imaging results that were available during my care of the patient were reviewed by me and considered in my medical decision making (see chart for details).      X-ray right knee without acute fracture or foreign body.  On exam, the laceration extends through the subcutaneous fat. No apparent damage to patellar tendon or joint capsule. Patient has full active flexion/extension of knee.  No signs of infection.  Laceration Repair Pressure irrigation performed. Wound explored and base of wound visualized in a bloodless field without evidence of foreign body.  Laceration occurred < 8 hours prior to repair which was well  tolerated. Tdap up to date per patient.  Given the depth of the wound, will send patient home with short course of Keflex.  Since the laceration is above the joint, patient will be sent home with knee immobilizer and crutches to protect the sutures.   Discussed suture home care with patient and answered questions. Pt to follow-up for wound check and suture removal in 10 days; she is to return to the ED sooner for signs of infection. Pt is hemodynamically stable with no complaints prior to dc. This was a shared visit with Dr. Laverta Baltimore who also saw the patient and agrees with plan and discharge home.   Final Clinical Impressions(s) / ED Diagnoses   Final diagnoses:  Knee laceration, right, initial encounter    ED Discharge Orders        Ordered    cephALEXin (KEFLEX) 500 MG capsule  4 times daily     06/16/18 1653       Bernarda Caffey 06/16/18 1707    Margette Fast, MD 06/16/18 2306

## 2018-06-16 NOTE — ED Notes (Signed)
Ortho tech in room

## 2018-06-16 NOTE — ED Notes (Signed)
Patient verbalizes understanding of discharge instructions. Opportunity for questioning and answers were provided. pt discharged from ED in wheelchair.

## 2018-06-16 NOTE — ED Provider Notes (Signed)
Patient placed in Quick Look pathway, seen and evaluated   Chief Complaint: Knee lacation  HPI:   Patient was walking up steps when she had a mechanical trip and her flip-flop got caught approximately 20 minutes prior to arrival.  She fell striking her right knee.  She has a laceration on her right knee.  She is unsure when her last tetanus shot was and is currently trying to check.  ROS: No numbness  Physical Exam:   Gen: No distress  Neuro: Awake and Alert  Skin: Warm    Focused Exam: There is a approximately 4 cm deep laceration over the anterior aspect of the right knee.  Wound gapes open when leg is bent.   Initiation of care has begun. The patient has been counseled on the process, plan, and necessity for staying for the completion/evaluation, and the remainder of the medical screening examination    [name] 06/16/18 1403    Brianna Boss, MD 06/18/18 1339

## 2018-06-16 NOTE — Progress Notes (Signed)
Orthopedic Tech Progress Note Patient Details:  [name] 1971/12/11 [id number]  Ortho Devices Type of Ortho Device: Crutches, Knee Immobilizer Ortho Device/Splint Location: RLE Ortho Device/Splint Interventions: Ordered, Application, Adjustment   Post Interventions Patient Tolerated: Well Instructions Provided: Care of device   Braulio Bosch 06/16/2018, 5:23 PM

## 2018-06-16 NOTE — ED Triage Notes (Signed)
Pt endorses tripping while walking up steps 20 minutes pta. Pt has deep 2 inch laceration to right knee, assessed by PA and this RN.

## 2018-06-16 NOTE — Discharge Instructions (Signed)
Please follow up with your regular doctor in 10 days to have your stitches removed. Clean daily with warm soapy water and dry thoroughly before putting dressing on it.   Use knee immobilizer for the next 10 days while the wound is healing.   Take antibiotic as prescribed.   Return to the ER for any new or concerning symptoms like redness, swelling or fever >100.24F.

## 2018-06-18 ENCOUNTER — Telehealth: Payer: Self-pay

## 2018-06-18 ENCOUNTER — Inpatient Hospital Stay: Payer: 59 | Attending: Genetic Counselor | Admitting: Genetic Counselor

## 2018-06-18 ENCOUNTER — Inpatient Hospital Stay: Payer: 59

## 2018-06-18 ENCOUNTER — Encounter: Payer: Self-pay | Admitting: Genetic Counselor

## 2018-06-18 DIAGNOSIS — Z1509 Genetic susceptibility to other malignant neoplasm: Secondary | ICD-10-CM

## 2018-06-18 DIAGNOSIS — Z315 Encounter for genetic counseling: Secondary | ICD-10-CM | POA: Diagnosis not present

## 2018-06-18 DIAGNOSIS — Z803 Family history of malignant neoplasm of breast: Secondary | ICD-10-CM

## 2018-06-18 DIAGNOSIS — Z1501 Genetic susceptibility to malignant neoplasm of breast: Secondary | ICD-10-CM | POA: Insufficient documentation

## 2018-06-18 HISTORY — DX: Genetic susceptibility to malignant neoplasm of breast: Z15.01

## 2018-06-18 NOTE — Telephone Encounter (Signed)
See prior telephone note, Returned pt's call per [name]- pt reports her Ob/Gyn is Dr Hulan Fray and we have her records already in [name].  Ok to make pt new patient appt for July 1st at 11am , arrive at 10:30 am.  Also per [name] - gave pt [name]'s number.  Pt saw Genetics counselor this am.  Pt reports she is adopted and recently found out her mother, grandmother, and aunt all had breast cancer.  No other needs per pt at this time.
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
## 2018-06-18 NOTE — Telephone Encounter (Addendum)
Incoming call from patient regarding she would like an appt with Gyn oncologist regarding she just found out genetics information and family history of cancer, would like to discuss having a hysterectomy  She reports she sees Dr Ancil Boozer and had an appt with Retail buyer here at [facility] today.  Told her that [facility] NP will review her information and we can call her back if appt appropriate here.   Pt voiced understanding. She reports she is adopted and based on her family history of breast cancer, she is considering a double mastectomy also. She would also like the name of a surgeon to further discuss surgery for double mastectomy.  Spoke with Melissa NP and she said pt will have to sign a release so we can obtain her records from her OB/Gyn like last Pap smear results.  Called pt back, no answer, left her a VM with our number and fax number - let her know she can contact her Ob/Gyn office and they can send her records here or she can sign a release to get referral process started here.

## 2018-06-19 ENCOUNTER — Other Ambulatory Visit: Payer: Self-pay | Admitting: Family Medicine

## 2018-06-19 ENCOUNTER — Telehealth: Payer: Self-pay

## 2018-06-19 DIAGNOSIS — Z1502 Genetic susceptibility to malignant neoplasm of ovary: Principal | ICD-10-CM

## 2018-06-19 DIAGNOSIS — Z1501 Genetic susceptibility to malignant neoplasm of breast: Secondary | ICD-10-CM

## 2018-06-19 DIAGNOSIS — Z1509 Genetic susceptibility to other malignant neoplasm: Principal | ICD-10-CM

## 2018-06-19 NOTE — Telephone Encounter (Signed)
Copied from [place] [id number]. Topic: Referral - Request >> Jun 19, 2018 10:16 AM Mylinda Latina, NT wrote: Reason for CRM: Patient called and states that she seen a  Genetic counselor at the cancer center and they stated she needs to see a Education officer, environmental. Patient is wanting a referral to [facility] to see Dr. Barry Dienes. Please call patient once that referral is put in CB# [phone]

## 2018-06-20 NOTE — Telephone Encounter (Signed)
Pt called and asking if see could see Dr. Barry Dienes on the referral.    She got a call from [facility] and really wanted to be referred to Dr. Barry Dienes at [facility]   Please call pt and let her know

## 2018-06-20 NOTE — Telephone Encounter (Signed)
Referral has been switched to the requested provider.

## 2018-06-21 NOTE — Progress Notes (Signed)
Consult Note: GYN-ONC New Patient FIRST VISIT  Consult was requested by Dr. Steele Sizer   CC:  Chief Complaint  Patient presents with  . BRCA1 positive    HPI: [name]  is a very nice 47 y.o. P4  The patient is adopted and pursued family identification.  She found out that multiple family members had history of breast cancer.  She discussed this with her primary care provider who performed BRCA testing. A pathogenic BRCA1 mutation was found [id number] that was likely inherited from her birth mother based on the maternal history.  The patient then visited with genetic counseling who recommended consideration for prophylactic mastectomy and prophylactic RRSO.  She was therefore referred for counseling regarding removal of the tubes and ovaries.  She does note irregular vaginal bleeding previously regular but heavy.  An IUD was placed and following that she has intermittent spotting occasionally daily.  No other complaints today  Imported EPIC Oncologic History:    No history exists.    Measurement of disease:  . N/A  Radiology: . 07/2017 CT AP for stones. Normal pelvic anatomy  Current Meds:  Outpatient Encounter Medications as of 06/25/2018  Medication Sig  . amitriptyline (ELAVIL) 25 MG tablet Take 1 tablet (25 mg total) by mouth at bedtime.  . DULoxetine (CYMBALTA) 60 MG capsule Take 1 capsule (60 mg total) by mouth daily. 30 mg first week after that stay on 60 mg  . fexofenadine (ALLEGRA ALLERGY) 180 MG tablet Take 1 tablet (180 mg total) by mouth daily as needed for allergies or rhinitis.  Marland Kitchen levonorgestrel (MIRENA) 20 MCG/24HR IUD 1 each by Intrauterine route once.  . meloxicam (MOBIC) 15 MG tablet Take 0.5-1 tablets (7.5-15 mg total) by mouth daily. (Patient taking differently: Take 7.5-15 mg by mouth as needed. )  . metroNIDAZOLE (METROGEL) 1 % gel Apply topically at bedtime.  Marland Kitchen omeprazole (PRILOSEC) 20 MG capsule Take 1 capsule (20 mg total) by mouth  daily.  . ranitidine (ZANTAC) 300 MG tablet Take 1 tablet (300 mg total) by mouth daily as needed for heartburn.  . valACYclovir (VALTREX) 1000 MG tablet Take 1 tablet (1,000 mg total) by mouth 2 (two) times daily. (Patient taking differently: Take 1,000 mg by mouth as needed. As needed for fever blisters)  . [DISCONTINUED] albuterol (PROVENTIL HFA;VENTOLIN HFA) 108 (90 Base) MCG/ACT inhaler Inhale 2 puffs into the lungs every 4 (four) hours as needed for wheezing or shortness of breath. (Patient not taking: Reported on 06/25/2018)  . [DISCONTINUED] cephALEXin (KEFLEX) 500 MG capsule Take 1 capsule (500 mg total) by mouth 4 (four) times daily. (Patient not taking: Reported on 06/25/2018)  . [DISCONTINUED] chlorpheniramine-HYDROcodone (TUSSIONEX PENNKINETIC ER) 10-8 MG/5ML SUER Take 5 mLs by mouth every 12 (twelve) hours as needed for cough. (Patient not taking: Reported on 06/25/2018)  . [DISCONTINUED] HYDROcodone-acetaminophen (NORCO/VICODIN) 5-325 MG tablet Take 1 tablet by mouth every 6 (six) hours as needed for moderate pain. (Patient not taking: Reported on 06/25/2018)   No facility-administered encounter medications on file as of 06/25/2018.     Allergy: No Known Allergies  Social Hx:  Tobacco use: Current every day smoker half pack per day. Alcohol use: Personally 6 drinks per month Illicit Drug use: None + Tattoos; multiple  Past Surgical Hx:  Past Surgical History:  Procedure Laterality Date  . APPENDECTOMY    . CESAREAN SECTION    . FRACTURE SURGERY  10/2016   Left wrist fracture surgery  . TUBAL LIGATION    .  WRIST SURGERY Left 11/21/2016   plates and screws inserted    Past Medical Hx:  Past Medical History:  Diagnosis Date  . Adopted   . Arthritis   . 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 Depression   . Family history of breast cancer   . Fever blister    Have had most of her life  . Frequent headaches   . GERD (gastroesophageal reflux  disease)   . Menorrhagia   . Migraines     Past Gynecological History:   GYNECOLOGIC HISTORY:  No LMP recorded. (Menstrual status: IUD). Menarche: 47 years old P 4 LMP occasional - daily bleeding since Mirena placement Contraceptive 1 year oral contraceptives in the past currently with Mirena IUD HRT none  Last Pap 2018- with positive high risk HPV.  This is to be repeated today on her first visit 06/25/2018  Family Hx:  Family History  Adopted: Yes  Problem Relation Age of Onset  . Lung cancer Father        deceased 63  . Breast cancer Mother 51       currently 40  . Colon cancer Mother   . ADD / ADHD Son   . ADD / ADHD Son   . Early death Maternal Aunt   . Breast cancer Maternal Aunt 34       deceased 108  . Breast cancer Maternal Grandmother   . Depression Daughter   . Depression Daughter   . Prostate cancer Paternal Uncle   . Stroke Paternal Uncle     Review of Systems:  Review of Systems  Genitourinary: Positive for vaginal bleeding.   All other systems reviewed and are negative.   Vitals:  Blood pressure 131/75, pulse 77, temperature 98.8 F (37.1 C), temperature source Oral, resp. rate 20, height '5\' 3"'$  (1.6 m), weight 243 lb 4.8 oz (110.4 kg), SpO2 100 %. Body mass index is 43.1 kg/m.   Physical Exam:  General :  Obese, well developed, 47 y.o., female in no apparent distress HEENT:  Normocephalic/atraumatic, symmetric, EOMI, eyelids normal Neck:   Supple, no masses.  Lymphatics:  No cervical/ submandibular/ supraclavicular/ infraclavicular/ inguinal adenopathy Respiratory:  Respirations unlabored, no use of accessory muscles CV:   Deferred Breast:  Deferred Musculoskeletal: No CVA tenderness, normal muscle strength. Abdomen:  Overweight, soft, non-tender and nondistended. No evidence of hernia. No masses. Extremities:  No lymphedema, no erythema, non-tender. Skin:   Normal inspection, multiple tattoos Neuro/Psych:  No focal motor deficit, no abnormal  mental status. Normal gait. Normal affect. Alert and oriented to person, place, and time  Genito Urinary: Vulva: Normal external female genitalia.  Bladder/urethra: Urethral meatus normal in size and location. No lesions or   masses, well supported bladder Speculum exam: Vagina: Some relaxation. No lesion, no discharge, no bleeding. Cervix: Difficult to visualize given vaginal sidewall interference. Normal appearing, no lesions. IUD string seen Bimanual exam:  Uterus: Normal size, mobile.  Adnexa: No masses. Rectovaginal:  Deferred   Assessment/Plan: 1. BRCA 1 o We reviewed her risks for ovarian cancer (20-40%) and that she is at an age that we would recommend proceeding with RRSO to decrease those risks o We talked about hysterectomy. There is some data BRCA1 patients may be at risk for UPSC but that has not come to hold the level of evidence needed to make that a standard recommendation with BRCA1. o She does have some spotting/bleeding issues with the IUD and I explained with  BSO the bleeding would stop. o She will make a decision if she wants RRSO only or Hysterectomy in addition. o I explained the increased surgical risks and postoperative complications by adding hysterectomy 2. Timing of surgery o It is her decision if she wants to proceed with ovary (+/- hyst) removal first versus mastectomy and I encouraged her to discuss with the breast surgeon to help guide that decision.  3. Screening o She wants to wait a little bit before proceeding with surgery so I recommend TVUS and CA125 every 6 months if she postpones our surgery for an extended time. o Will check those in the next couple of weeks while she makes a decision. 4. Surgical discussion o We reviewed the surgical risks with a surgical sketch today and she was given a copy o I specifically addressed the (~ 5%) risk of incidental ovarian cancer at the time of surgery and the small risk of conversion to a staging procedure,  possible laparotomy. o We briefly discussed STIC lesions and possibility of additional surgery/chemo 5. +HRHPV   o Pap from 2018 showed +HRHPV o Pap done today. o May need colposcopy depending on results. (Her Gyn is now out of her network) i. She has a difficult exam. Colposcopy will be longer than usual. 6. She is to call us when ready to schedule surgery.  Brianna Caprice, MD  06/25/2018, 1:10 PM    Cc: Steele Sizer, MD (Referring PCP) [name], MD (OB/Gyn)

## 2018-06-25 ENCOUNTER — Inpatient Hospital Stay: Payer: 59

## 2018-06-25 ENCOUNTER — Inpatient Hospital Stay: Payer: 59 | Attending: Obstetrics | Admitting: Obstetrics

## 2018-06-25 ENCOUNTER — Other Ambulatory Visit (HOSPITAL_COMMUNITY)
Admission: RE | Admit: 2018-06-25 | Discharge: 2018-06-25 | Disposition: A | Discharge status: Home / Self Care | Payer: 59 | Source: Ambulatory Visit | Attending: Obstetrics | Admitting: Obstetrics

## 2018-06-25 ENCOUNTER — Encounter: Payer: Self-pay | Admitting: Obstetrics

## 2018-06-25 VITALS — BP 131/75 | HR 77 | Temp 98.8°F | Resp 20 | Ht 63.0 in | Wt 243.3 lb

## 2018-06-25 DIAGNOSIS — Z1501 Genetic susceptibility to malignant neoplasm of breast: Secondary | ICD-10-CM | POA: Diagnosis not present

## 2018-06-25 DIAGNOSIS — Z8742 Personal history of other diseases of the female genital tract: Secondary | ICD-10-CM | POA: Diagnosis not present

## 2018-06-25 DIAGNOSIS — Z09 Encounter for follow-up examination after completed treatment for conditions other than malignant neoplasm: Secondary | ICD-10-CM | POA: Insufficient documentation

## 2018-06-25 DIAGNOSIS — Z1509 Genetic susceptibility to other malignant neoplasm: Secondary | ICD-10-CM | POA: Diagnosis not present

## 2018-06-25 DIAGNOSIS — B977 Papillomavirus as the cause of diseases classified elsewhere: Secondary | ICD-10-CM | POA: Diagnosis not present

## 2018-06-25 NOTE — Patient Instructions (Addendum)
1. Pap was done today. We may need to call you back for a colposcopy depending on the results 2. CA125 will be done today 3. Vaginal and pelvic ultrasound will be scheduled. 4. Call us when you are ready to schedule surgery 5. Keep your appointment with the surgeon to discuss your mastectomy                 Please call our office at [phone] when you are ready to schedule your surgery.  Preparing for your Surgery  Plan for surgery with Dr. Precious Haws at [facility] will be scheduled for a robotic assisted total laparoscopic hysterectomy, bilateral salpingo-oophorectomy, possible laparotomy, possible staging.  Pre-operative Testing -You will receive a phone call from presurgical testing at [facility] to arrange for a pre-operative testing appointment before your surgery.  This appointment normally occurs one to two weeks before your scheduled surgery.   -Bring your insurance card, copy of an advanced directive if applicable, medication list  -At that visit, you will be asked to sign a consent for a possible blood transfusion in case a transfusion becomes necessary during surgery.  The need for a blood transfusion is rare but having consent is a necessary part of your care.    -You should not be taking blood thinners or aspirin at least ten days prior to surgery unless instructed by your surgeon.  Day Before Surgery at [facility] will be asked to take in a light diet the day before surgery.  Avoid carbonated beverages.  You will be advised to have nothing to eat or drink after midnight the evening before.    Eat a light diet the day before surgery.  Examples including soups, broths, toast, yogurt, mashed potatoes.  Things to avoid include carbonated beverages (fizzy beverages), raw fruits and raw vegetables, or beans.   If your bowels are filled with gas, your surgeon will have difficulty visualizing your pelvic organs which increases your surgical  risks.  Your role in recovery Your role is to become active as soon as directed by your doctor, while still giving yourself time to heal.  Rest when you feel tired. You will be asked to do the following in order to speed your recovery:  - Cough and breathe deeply. This helps toclear and expand your lungs and can prevent pneumonia. You may be given a spirometer to practice deep breathing. A staff member will show you how to use the spirometer. - Do mild physical activity. Walking or moving your legs help your circulation and body functions return to normal. A staff member will help you when you try to walk and will provide you with simple exercises. Do not try to get up or walk alone the first time. - Actively manage your pain. Managing your pain lets you move in comfort. We will ask you to rate your pain on a scale of zero to 10. It is your responsibility to tell your doctor or nurse where and how much you hurt so your pain can be treated.  Special Considerations -If you are diabetic, you may be placed on insulin after surgery to have closer control over your blood sugars to promote healing and recovery.  This does not mean that you will be discharged on insulin.  If applicable, your oral antidiabetics will be resumed when you are tolerating a solid diet.  -Your final pathology results from surgery should be available by the Friday after surgery and the results will be  relayed to you when available.  -Dr. Lahoma Crocker is the Surgeon that assists your GYN Oncologist with surgery.  The next day after your surgery you will either see your GYN Oncologist or Dr. Lahoma Crocker.   Blood Transfusion Information WHAT IS A BLOOD TRANSFUSION? A transfusion is the replacement of blood or some of its parts. Blood is made up of multiple cells which provide different functions.  Red blood cells carry oxygen and are used for blood loss replacement.  White blood cells fight against  infection.  Platelets control bleeding.  Plasma helps clot blood.  Other blood products are available for specialized needs, such as hemophilia or other clotting disorders. BEFORE THE TRANSFUSION  Who gives blood for transfusions?   You may be able to donate blood to be used at a later date on yourself (autologous donation).  Relatives can be asked to donate blood. This is generally not any safer than if you have received blood from a stranger. The same precautions are taken to ensure safety when a relative's blood is donated.  Healthy volunteers who are fully evaluated to make sure their blood is safe. This is blood bank blood. Transfusion therapy is the safest it has ever been in the practice of medicine. Before blood is taken from a donor, a complete history is taken to make sure that person has no history of diseases nor engages in risky social behavior (examples are intravenous drug use or sexual activity with multiple partners). The donor's travel history is screened to minimize risk of transmitting infections, such as malaria. The donated blood is tested for signs of infectious diseases, such as HIV and hepatitis. The blood is then tested to be sure it is compatible with you in order to minimize the chance of a transfusion reaction. If you or a relative donates blood, this is often done in anticipation of surgery and is not appropriate for emergency situations. It takes many days to process the donated blood. RISKS AND COMPLICATIONS Although transfusion therapy is very safe and saves many lives, the main dangers of transfusion include:   Getting an infectious disease.  Developing a transfusion reaction. This is an allergic reaction to something in the blood you were given. Every precaution is taken to prevent this. The decision to have a blood transfusion has been considered carefully by your caregiver before blood is given. Blood is not given unless the benefits outweigh the risks.

## 2018-06-26 LAB — CA 125: Cancer Antigen (CA) 125: 4.2 U/mL (ref 0.0–38.1)

## 2018-06-27 ENCOUNTER — Telehealth: Payer: Self-pay

## 2018-06-27 NOTE — Telephone Encounter (Signed)
Outgoing call per [name] regarding pt's CA 125 results are "within normal limits" - pt voiced understanding and no other needs per pt at this time.

## 2018-06-28 DIAGNOSIS — Z4802 Encounter for removal of sutures: Secondary | ICD-10-CM | POA: Diagnosis not present

## 2018-06-28 DIAGNOSIS — L84 Corns and callosities: Secondary | ICD-10-CM | POA: Diagnosis not present

## 2018-06-28 DIAGNOSIS — S90851A Superficial foreign body, right foot, initial encounter: Secondary | ICD-10-CM | POA: Diagnosis not present

## 2018-06-28 DIAGNOSIS — M79671 Pain in right foot: Secondary | ICD-10-CM | POA: Diagnosis not present

## 2018-06-29 ENCOUNTER — Telehealth: Payer: Self-pay

## 2018-06-29 ENCOUNTER — Ambulatory Visit (HOSPITAL_COMMUNITY)
Admission: RE | Admit: 2018-06-29 | Discharge: 2018-06-29 | Disposition: A | Discharge status: Home / Self Care | Payer: 59 | Source: Ambulatory Visit | Attending: Obstetrics | Admitting: Obstetrics

## 2018-06-29 DIAGNOSIS — Z1501 Genetic susceptibility to malignant neoplasm of breast: Secondary | ICD-10-CM | POA: Diagnosis present

## 2018-06-29 DIAGNOSIS — N83291 Other ovarian cyst, right side: Secondary | ICD-10-CM | POA: Insufficient documentation

## 2018-06-29 DIAGNOSIS — Z1509 Genetic susceptibility to other malignant neoplasm: Secondary | ICD-10-CM | POA: Insufficient documentation

## 2018-06-29 DIAGNOSIS — N83201 Unspecified ovarian cyst, right side: Secondary | ICD-10-CM | POA: Diagnosis not present

## 2018-06-29 NOTE — Telephone Encounter (Signed)
Told [name] the results of her CA-125 as noted below by Dr. Gerarda Fraction.

## 2018-06-29 NOTE — Telephone Encounter (Signed)
-----   Message from Isabel Caprice, MD sent at 06/26/2018  5:20 PM EDT -----  Let her know the bloodwork (CA125) was normal ----- Message ----- From: Interface, Lab In [place] Sent: 06/26/2018   8:39 AM To: Isabel Caprice, MD

## 2018-07-02 ENCOUNTER — Other Ambulatory Visit: Payer: Self-pay | Admitting: General Surgery

## 2018-07-02 ENCOUNTER — Telehealth: Payer: Self-pay | Admitting: Obstetrics

## 2018-07-02 ENCOUNTER — Other Ambulatory Visit: Payer: Self-pay | Admitting: Obstetrics

## 2018-07-02 DIAGNOSIS — N83201 Unspecified ovarian cyst, right side: Secondary | ICD-10-CM

## 2018-07-02 DIAGNOSIS — Z803 Family history of malignant neoplasm of breast: Secondary | ICD-10-CM | POA: Diagnosis not present

## 2018-07-02 LAB — CYTOLOGY - PAP
ADEQUACY: ABSENT
DIAGNOSIS: NEGATIVE

## 2018-07-02 NOTE — Telephone Encounter (Signed)
Called patient and informed of ultrasound result. Patient wishes to proceed with repeat imaging in 6 week.s order palced

## 2018-07-03 ENCOUNTER — Telehealth: Payer: Self-pay | Admitting: *Deleted

## 2018-07-03 NOTE — Telephone Encounter (Signed)
Called and scheduled [place] scan for the patient. Called and left the patient a message to call the office back. Need to give the patient the information for the scan

## 2018-07-05 DIAGNOSIS — N911 Secondary amenorrhea: Secondary | ICD-10-CM | POA: Diagnosis not present

## 2018-07-10 ENCOUNTER — Other Ambulatory Visit: Payer: Self-pay | Admitting: General Surgery

## 2018-07-11 ENCOUNTER — Other Ambulatory Visit: Payer: Self-pay | Admitting: General Surgery

## 2018-07-11 DIAGNOSIS — Z1509 Genetic susceptibility to other malignant neoplasm: Principal | ICD-10-CM

## 2018-07-11 DIAGNOSIS — Z1501 Genetic susceptibility to malignant neoplasm of breast: Secondary | ICD-10-CM

## 2018-07-21 ENCOUNTER — Inpatient Hospital Stay
Admission: RE | Admit: 2018-07-21 | Discharge: 2018-07-21 | Disposition: A | Discharge status: Home / Self Care | Payer: 59 | Source: Ambulatory Visit | Attending: General Surgery | Admitting: General Surgery

## 2018-07-23 ENCOUNTER — Telehealth: Payer: Self-pay | Admitting: Family Medicine

## 2018-07-23 NOTE — Telephone Encounter (Signed)
Not a patient at this location

## 2018-07-23 NOTE — Telephone Encounter (Signed)
Copied from [place] [id number]. Topic: Referral - Question >> Jul 23, 2018  3:05 PM Cecelia Byars, NT wrote: Reason for CRM: [facility] called and said they need a referral for the patient , please call [phone] fax  [phone], she was seen on 07/09/18 and has an appointment 08/23/18 for pre op she also  has surgery scheduled for 9/ 17/19 please advise

## 2018-07-24 ENCOUNTER — Other Ambulatory Visit: Payer: Self-pay | Admitting: General Surgery

## 2018-07-24 ENCOUNTER — Other Ambulatory Visit: Payer: Self-pay | Admitting: Gynecologic Oncology

## 2018-07-24 DIAGNOSIS — Z1509 Genetic susceptibility to other malignant neoplasm: Principal | ICD-10-CM

## 2018-07-24 DIAGNOSIS — Z1501 Genetic susceptibility to malignant neoplasm of breast: Secondary | ICD-10-CM

## 2018-07-25 ENCOUNTER — Telehealth: Payer: Self-pay | Admitting: Family Medicine

## 2018-07-25 DIAGNOSIS — Z1501 Genetic susceptibility to malignant neoplasm of breast: Secondary | ICD-10-CM

## 2018-07-25 DIAGNOSIS — Z1509 Genetic susceptibility to other malignant neoplasm: Principal | ICD-10-CM

## 2018-07-25 NOTE — Telephone Encounter (Unsigned)
Copied from [place] [id number]. Topic: Referral - Request >> Jul 25, 2018 11:35 AM Judyann Munson wrote: Reason for CRM: [facility] reconstruct[facility] surgery called and said they need a referral for the patient , please call [phone] fax [phone], she was seen on 07/09/18 and has an appointment 08/23/18 for pre op she also has surgery scheduled for 9/ 17/19 please advise

## 2018-07-25 NOTE — Telephone Encounter (Signed)
Is it ok to proceed with referral?

## 2018-07-25 NOTE — Telephone Encounter (Signed)
Yes. BRAC positive,

## 2018-07-26 NOTE — Telephone Encounter (Signed)
Referral has been placed. 

## 2018-07-28 ENCOUNTER — Ambulatory Visit
Admission: RE | Admit: 2018-07-28 | Discharge: 2018-07-28 | Disposition: A | Discharge status: Home / Self Care | Payer: 59 | Source: Ambulatory Visit | Attending: General Surgery | Admitting: General Surgery

## 2018-08-06 ENCOUNTER — Telehealth: Payer: Self-pay | Admitting: General Surgery

## 2018-08-06 ENCOUNTER — Encounter: Payer: 59 | Admitting: Family Medicine

## 2018-08-10 ENCOUNTER — Ambulatory Visit (HOSPITAL_COMMUNITY): Admission: RE | Admit: 2018-08-10 | Payer: 59 | Source: Ambulatory Visit

## 2018-08-13 ENCOUNTER — Telehealth: Payer: Self-pay | Admitting: *Deleted

## 2018-08-13 NOTE — Telephone Encounter (Signed)
Called and left the patient a message to the office back. The patient missed the [place] scan.

## 2018-08-15 ENCOUNTER — Telehealth: Payer: Self-pay | Admitting: *Deleted

## 2018-08-15 NOTE — Telephone Encounter (Signed)
Called and left the patient a message to call the office back. Patient missed her [place] scan, needs to be rescheduled

## 2018-08-15 NOTE — Telephone Encounter (Signed)
I called to tell patient the results from her cytopathology report from 06/25/18.  Patient did not answer.  I left the patient a voicemail to give our office a call back.

## 2018-08-30 DIAGNOSIS — J020 Streptococcal pharyngitis: Secondary | ICD-10-CM | POA: Diagnosis not present

## 2018-08-30 DIAGNOSIS — J029 Acute pharyngitis, unspecified: Secondary | ICD-10-CM | POA: Diagnosis not present

## 2018-09-06 ENCOUNTER — Other Ambulatory Visit (HOSPITAL_COMMUNITY): Payer: 59

## 2018-09-10 NOTE — Telephone Encounter (Signed)
error 

## 2018-09-11 ENCOUNTER — Encounter (HOSPITAL_COMMUNITY): Payer: Self-pay

## 2018-09-11 ENCOUNTER — Ambulatory Visit (HOSPITAL_COMMUNITY): Admit: 2018-09-11 | Payer: 59 | Admitting: General Surgery

## 2018-09-11 SURGERY — MASTECTOMY, NIPPLE SPARING
Anesthesia: General | Site: Breast | Laterality: Bilateral

## 2018-09-17 ENCOUNTER — Other Ambulatory Visit: Payer: Self-pay | Admitting: Family Medicine

## 2018-09-17 DIAGNOSIS — M5136 Other intervertebral disc degeneration, lumbar region: Secondary | ICD-10-CM

## 2018-09-17 DIAGNOSIS — F330 Major depressive disorder, recurrent, mild: Secondary | ICD-10-CM

## 2018-10-06 DIAGNOSIS — M79602 Pain in left arm: Secondary | ICD-10-CM | POA: Diagnosis not present

## 2018-10-06 DIAGNOSIS — T2210XA Burn of first degree of shoulder and upper limb, except wrist and hand, unspecified site, initial encounter: Secondary | ICD-10-CM | POA: Diagnosis not present

## 2018-10-06 DIAGNOSIS — T2220XA Burn of second degree of shoulder and upper limb, except wrist and hand, unspecified site, initial encounter: Secondary | ICD-10-CM | POA: Diagnosis not present

## 2018-10-22 ENCOUNTER — Other Ambulatory Visit: Payer: Self-pay | Admitting: Family Medicine

## 2018-11-05 ENCOUNTER — Other Ambulatory Visit: Payer: Self-pay | Admitting: Urology

## 2018-11-07 ENCOUNTER — Other Ambulatory Visit: Payer: Self-pay | Admitting: Urology

## 2018-11-20 ENCOUNTER — Encounter: Payer: 59 | Admitting: Family Medicine

## 2018-12-05 ENCOUNTER — Other Ambulatory Visit: Payer: Self-pay

## 2018-12-05 DIAGNOSIS — F330 Major depressive disorder, recurrent, mild: Secondary | ICD-10-CM

## 2018-12-05 DIAGNOSIS — M5136 Other intervertebral disc degeneration, lumbar region: Secondary | ICD-10-CM

## 2018-12-05 NOTE — Telephone Encounter (Signed)
Refill request for general medication: Omeprazole 60 mg  Last office visit: 05/17/2018  Last physical exam: None indicated  Follow-ups on file. None indicated

## 2018-12-05 NOTE — Telephone Encounter (Signed)
Can she come in Friday? Due for follow up

## 2018-12-06 NOTE — Telephone Encounter (Signed)
lvm [phone] @ 8:39 asked pt to return call to schedule an appt with Dr Ancil Boozer. Is able to see [name] if Dr Ancil Boozer is booked

## 2018-12-09 NOTE — Telephone Encounter (Signed)
Is she still my patient?  She needs follow up, we can send a refill until her appointment

## 2018-12-10 MED ORDER — DULOXETINE HCL 60 MG PO CPEP: 60.0000 mg | ORAL_CAPSULE | Freq: Every day | ORAL | 0 refills | Status: DC

## 2018-12-10 NOTE — Telephone Encounter (Signed)
Left message to call and schedule appt for follow up

## 2018-12-24 ENCOUNTER — Other Ambulatory Visit: Payer: Self-pay | Admitting: Family Medicine

## 2018-12-24 DIAGNOSIS — F330 Major depressive disorder, recurrent, mild: Secondary | ICD-10-CM

## 2018-12-24 DIAGNOSIS — M5136 Other intervertebral disc degeneration, lumbar region: Secondary | ICD-10-CM

## 2018-12-24 NOTE — Telephone Encounter (Signed)
Copied from [place] [id number]. Topic: Quick Communication - Rx Refill/Question >> Dec 24, 2018  4:20 PM [name], Brianna Reap wrote: Pt has an appt scheduled for a physical on 03/05/19 at 1:20 pm.  Medication: DULoxetine (CYMBALTA) 60 MG capsule  Has the patient contacted their pharmacy? no  Preferred Pharmacy (with phone number or street name): [facility] [phone] - [place], [place] - [address] AT [address] [phone] (Phone)  [phone] (Fax)  Agent: Please be advised that RX refills may take up to 3 business days. We ask that you follow-up with your pharmacy.

## 2018-12-25 MED ORDER — DULOXETINE HCL 60 MG PO CPEP: 60.0000 mg | ORAL_CAPSULE | Freq: Every day | ORAL | 0 refills | Status: DC

## 2018-12-25 NOTE — Telephone Encounter (Signed)
Patient was supposed to be seen in June 2019. Could you please get patient in sooner than March 2020.

## 2018-12-25 NOTE — Telephone Encounter (Signed)
Check CRM message: I left voice message this morning on pt cell for pt to return call. She is needing to schedule appt sooner than March for med refills. Pt have the option of seeing Raquel Sarna.

## 2019-01-06 ENCOUNTER — Other Ambulatory Visit: Payer: Self-pay | Admitting: Family Medicine

## 2019-01-06 DIAGNOSIS — M51369 Other intervertebral disc degeneration, lumbar region without mention of lumbar back pain or lower extremity pain: Secondary | ICD-10-CM

## 2019-01-06 DIAGNOSIS — M5136 Other intervertebral disc degeneration, lumbar region: Secondary | ICD-10-CM

## 2019-01-06 DIAGNOSIS — F330 Major depressive disorder, recurrent, mild: Secondary | ICD-10-CM

## 2019-03-05 ENCOUNTER — Encounter: Payer: 59 | Admitting: Family Medicine

## 2019-03-25 ENCOUNTER — Other Ambulatory Visit: Payer: Self-pay | Admitting: Family Medicine

## 2019-03-25 DIAGNOSIS — M5136 Other intervertebral disc degeneration, lumbar region: Secondary | ICD-10-CM

## 2019-03-25 DIAGNOSIS — F330 Major depressive disorder, recurrent, mild: Secondary | ICD-10-CM

## 2019-03-26 NOTE — Telephone Encounter (Signed)
Refill request for general medication: Cymbalta 60 mg  Last office visit: 05/17/2018  Follow-ups on file. 05/01/2019

## 2019-05-01 ENCOUNTER — Encounter: Payer: 59 | Admitting: Family Medicine

## 2019-05-02 ENCOUNTER — Encounter: Payer: 59 | Admitting: Family Medicine

## 2019-06-06 ENCOUNTER — Other Ambulatory Visit: Payer: Self-pay | Admitting: Family Medicine

## 2019-06-06 DIAGNOSIS — M5136 Other intervertebral disc degeneration, lumbar region: Secondary | ICD-10-CM

## 2019-06-06 DIAGNOSIS — F330 Major depressive disorder, recurrent, mild: Secondary | ICD-10-CM

## 2019-06-07 NOTE — Telephone Encounter (Signed)
Dr Ancil Boozer tried calling her and no vm set up. Just FYI took a look at all the cancellations just this year is 3 and the 2019 yr has several cancellations and no shows.

## 2019-06-21 ENCOUNTER — Other Ambulatory Visit: Payer: Self-pay

## 2019-06-21 ENCOUNTER — Emergency Department (HOSPITAL_COMMUNITY): Payer: 59

## 2019-06-21 ENCOUNTER — Emergency Department (HOSPITAL_COMMUNITY)
Admission: EM | Admit: 2019-06-21 | Discharge: 2019-06-21 | Disposition: A | Discharge status: Home / Self Care | Payer: 59 | Attending: Emergency Medicine | Admitting: Emergency Medicine

## 2019-06-21 DIAGNOSIS — Z79899 Other long term (current) drug therapy: Secondary | ICD-10-CM | POA: Diagnosis not present

## 2019-06-21 DIAGNOSIS — B349 Viral infection, unspecified: Secondary | ICD-10-CM | POA: Insufficient documentation

## 2019-06-21 DIAGNOSIS — Z853 Personal history of malignant neoplasm of breast: Secondary | ICD-10-CM | POA: Diagnosis not present

## 2019-06-21 DIAGNOSIS — Z20828 Contact with and (suspected) exposure to other viral communicable diseases: Secondary | ICD-10-CM | POA: Diagnosis not present

## 2019-06-21 DIAGNOSIS — R51 Headache: Secondary | ICD-10-CM | POA: Insufficient documentation

## 2019-06-21 DIAGNOSIS — R079 Chest pain, unspecified: Secondary | ICD-10-CM | POA: Insufficient documentation

## 2019-06-21 DIAGNOSIS — F17210 Nicotine dependence, cigarettes, uncomplicated: Secondary | ICD-10-CM | POA: Diagnosis not present

## 2019-06-21 DIAGNOSIS — R05 Cough: Secondary | ICD-10-CM | POA: Insufficient documentation

## 2019-06-21 DIAGNOSIS — R509 Fever, unspecified: Secondary | ICD-10-CM | POA: Diagnosis present

## 2019-06-21 LAB — BASIC METABOLIC PANEL
Anion gap: 9 (ref 5–15)
BUN: 6 mg/dL (ref 6–20)
CO2: 20 mmol/L — ABNORMAL LOW (ref 22–32)
Calcium: 8.8 mg/dL — ABNORMAL LOW (ref 8.9–10.3)
Chloride: 102 mmol/L (ref 98–111)
Creatinine, Ser: 0.77 mg/dL (ref 0.44–1.00)
GFR calc Af Amer: >60 mL/min (ref >60)
GFR calc non Af Amer: >60 mL/min (ref >60)
Glucose, Bld: 121 mg/dL — ABNORMAL HIGH (ref 70–99)
Potassium: 3.8 mmol/L (ref 3.5–5.1)
Sodium: 131 mmol/L — ABNORMAL LOW (ref 135–145)

## 2019-06-21 LAB — SARS CORONAVIRUS 2 BY RT PCR (HOSPITAL ORDER, PERFORMED IN ~~LOC~~ HOSPITAL LAB): SARS Coronavirus 2: NEGATIVE

## 2019-06-21 LAB — URINALYSIS, ROUTINE W REFLEX MICROSCOPIC
Bacteria, UA: NONE SEEN
Bilirubin Urine: NEGATIVE
Glucose, UA: NEGATIVE mg/dL
Ketones, ur: NEGATIVE mg/dL
Leukocytes,Ua: NEGATIVE
Nitrite: NEGATIVE
Protein, ur: NEGATIVE mg/dL
Specific Gravity, Urine: 1.02 (ref 1.005–1.030)
pH: 6 (ref 5.0–8.0)

## 2019-06-21 LAB — CBC
HCT: 45.3 % (ref 36.0–46.0)
Hemoglobin: 15.1 g/dL — ABNORMAL HIGH (ref 12.0–15.0)
MCH: 32.1 pg (ref 26.0–34.0)
MCHC: 33.3 g/dL (ref 30.0–36.0)
MCV: 96.4 fL (ref 80.0–100.0)
Platelets: 165 10*3/uL (ref 150–400)
RBC: 4.7 MIL/uL (ref 3.87–5.11)
RDW: 12.2 % (ref 11.5–15.5)
WBC: 15.3 10*3/uL — ABNORMAL HIGH (ref 4.0–10.5)
nRBC: 0 % (ref 0.0–0.2)

## 2019-06-21 LAB — PREGNANCY, URINE: Preg Test, Ur: NEGATIVE

## 2019-06-21 LAB — LACTIC ACID, PLASMA: Lactic Acid, Venous: 1.9 mmol/L (ref 0.5–1.9)

## 2019-06-21 LAB — TROPONIN I (HIGH SENSITIVITY): Troponin I (High Sensitivity): 4 ng/L (ref <18)

## 2019-06-21 MED ORDER — KETOROLAC TROMETHAMINE 30 MG/ML IJ SOLN
30.0000 mg | Freq: Once | INTRAMUSCULAR | Status: AC
  Administered 2019-06-21: 30 mg via INTRAVENOUS
  Filled 2019-06-21: qty 1

## 2019-06-21 MED ORDER — SODIUM CHLORIDE 0.9% FLUSH: 3.0000 mL | Freq: Once | INTRAVENOUS | Status: DC

## 2019-06-21 MED ORDER — SODIUM CHLORIDE 0.9 % IV BOLUS (SEPSIS)
1000.0000 mL | Freq: Once | INTRAVENOUS | Status: AC
  Administered 2019-06-21: 1000 mL via INTRAVENOUS

## 2019-06-21 MED ORDER — ACETAMINOPHEN 325 MG PO TABS
650.0000 mg | ORAL_TABLET | Freq: Once | ORAL | Status: AC
  Administered 2019-06-21: 02:00:00 650 mg via ORAL
  Filled 2019-06-21: qty 2

## 2019-06-21 NOTE — ED Notes (Signed)
Discharge instructions discussed with pt. Pt. verbalized understanding. No questions at this time

## 2019-06-21 NOTE — ED Notes (Signed)
Ambulated pt in hall. Pt 97-98% on room air. Denies SOB

## 2019-06-21 NOTE — ED Triage Notes (Addendum)
Per pt she started having cough, fevers, chills, chest discomfort for 1 day now. Pt says bodyaches and chills. Pt said chest discomfort is from cough and taking deep breathes. Low grade temp now but took tylenol before came in and temp was 102.9

## 2019-06-21 NOTE — ED Provider Notes (Signed)
[facility] EMERGENCY DEPARTMENT Provider Note   CSN: [id number] Arrival date & time: 06/21/19  0032     History   Chief Complaint Chief Complaint  Patient presents with  . Fever  . Chest Pain  . Cough    HPI [name] is a 48 y.o. female.     The history is provided by the patient.  Fever Severity:  Moderate Onset quality:  Gradual Progression:  Worsening Chronicity:  New Relieved by:  Acetaminophen Worsened by:  Nothing Associated symptoms: chest pain, chills, cough, headaches and myalgias   Associated symptoms: no diarrhea, no rash and no vomiting   Chest Pain Associated symptoms: cough, fever and headache   Associated symptoms: no vomiting   Cough Associated symptoms: chest pain, chills, fever, headaches and myalgias   Associated symptoms: no rash   Patient with cough/fever/myalgias for the past day.  No travel.  No rash or tick bite She reports pleuritic CP  No hemoptysis Past Medical History:  Diagnosis Date  . 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 Depression   . Family history of breast cancer   . GERD (gastroesophageal reflux disease)   . Menorrhagia   . Migraines   . Osteoarthritis    back  . Plantar fasciitis     Patient Active Problem List   Diagnosis Date Noted  . 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 Fever blister 05/17/2018  . Family history of breast cancer 05/08/2018  . Family history of colon cancer in mother 05/08/2018  . Migraine with aura and without status migrainosus 04/18/2018  . Dysmenorrhea 06/21/2017  . Menorrhagia 06/21/2017  . Mild recurrent major depression ([place]) 01/20/2016  . Esophageal reflux 01/20/2016  . Osteoarthritis, multiple sites 01/20/2016  . Frequent headaches 01/20/2016    Past Surgical History:  Procedure Laterality Date  . APPENDECTOMY     LSC but "ruptured when they did the surgery"  . CESAREAN SECTION    . TUBAL LIGATION     at time of CSxn  . WRIST  SURGERY Left 11/21/2016   plates and screws inserted     OB History    Gravida  5   Para  4   Term  4   Preterm      AB  1   Living  4     SAB      TAB  1   Ectopic      Multiple      Live Births  4            Home Medications    Prior to Admission medications   Medication Sig Start Date End Date Taking? Authorizing Provider  amitriptyline (ELAVIL) 25 MG tablet Take 1 tablet (25 mg total) by mouth at bedtime. 04/18/18  Yes Sowles, Drue Stager, MD  DULoxetine (CYMBALTA) 60 MG capsule TAKE 1 CAPSULE BY MOUTH DAILY Patient taking differently: Take 60 mg by mouth daily.  03/26/19  Yes Sowles, Drue Stager, MD  fexofenadine [facility] ALLERGY) 180 MG tablet Take 1 tablet (180 mg total) by mouth daily as needed for allergies or rhinitis. 05/14/18  Yes Lada, Satira Anis, MD  levonorgestrel (MIRENA) 20 MCG/24HR IUD 1 each by Intrauterine route once.   Yes [provider]  meloxicam (MOBIC) 15 MG tablet Take 0.5-1 tablets (7.5-15 mg total) by mouth daily. Patient taking differently: Take 7.5-15 mg by mouth as needed for pain.  04/18/18  Yes Sowles, Drue Stager, MD  omeprazole (PRILOSEC) 20 MG  capsule Take 1 capsule (20 mg total) by mouth daily. 04/18/18  Yes Sowles, Drue Stager, MD  ranitidine (ZANTAC) 300 MG tablet Take 1 tablet (300 mg total) by mouth daily as needed for heartburn. 04/18/18  Yes Sowles, Drue Stager, MD  valACYclovir (VALTREX) 1000 MG tablet Take 1 tablet (1,000 mg total) by mouth 2 (two) times daily. Patient taking differently: Take 1,000 mg by mouth as needed (for fever blisters).  05/17/18  Yes Steele Sizer, MD    Family History Family History  Adopted: Yes  Problem Relation Age of Onset  . Lung cancer Father        deceased 83  . Breast cancer Mother 74       currently 21  . Colon cancer Mother   . ADD / ADHD Son   . ADD / ADHD Son   . Early death Maternal Aunt   . Breast cancer Maternal Aunt 34       deceased 22  . Breast cancer Maternal Grandmother   .  Depression Daughter   . Depression Daughter   . Prostate cancer Paternal Uncle   . Stroke Paternal Uncle   . Leukemia Paternal Aunt     Social History Social History   Tobacco Use  . Smoking status: Current Every Day Smoker    Packs/day: 0.50    Years: 20.00    Pack years: 10.00    Types: Cigarettes  . Smokeless tobacco: Never Used  Substance Use Topics  . Alcohol use: Yes    Alcohol/week: 0.0 standard drinks    Comment: Socially  . Drug use: No     Allergies   Patient has no known allergies.   Review of Systems Review of Systems  Constitutional: Positive for chills and fever.  Respiratory: Positive for cough.   Cardiovascular: Positive for chest pain.  Gastrointestinal: Negative for diarrhea and vomiting.  Musculoskeletal: Positive for myalgias.  Skin: Negative for rash.  Neurological: Positive for headaches.  All other systems reviewed and are negative.    Physical Exam Updated Vital Signs BP 114/60   Pulse (!) 110   Temp 100.1 F (37.8 C) (Oral)   Resp (!) 24   Ht 1.6 m (_0 )   Wt 104.3 kg   SpO2 96%   BMI 40.74 kg/m   Physical Exam CONSTITUTIONAL: Well developed/well nourished HEAD: Normocephalic/atraumatic EYES: EOMI/PERRL ENMT: Mask in place NECK: supple no meningeal signs SPINE/BACK:entire spine nontender CV: S1/S2 noted, no murmurs/rubs/gallops noted LUNGS: Lungs are clear to auscultation bilaterally, no apparent distress ABDOMEN: soft, nontender, no rebound or guarding, bowel sounds noted throughout abdomen GU:no cva tenderness NEURO: Pt is awake/alert/appropriate, moves all extremitiesx4.  No facial droop.   EXTREMITIES: pulses normal/equal, full ROM SKIN: warm, color normal PSYCH: no abnormalities of mood noted, alert and oriented to situation   ED Treatments / Results  Labs (all labs ordered are listed, but only abnormal results are displayed) Labs Reviewed  BASIC METABOLIC PANEL - Abnormal; Notable for the following  components:      Result Value   Sodium 131 (*)    CO2 20 (*)    Glucose, Bld 121 (*)    Calcium 8.8 (*)    All other components within normal limits  CBC - Abnormal; Notable for the following components:   WBC 15.3 (*)    Hemoglobin 15.1 (*)    All other components within normal limits  URINALYSIS, ROUTINE W REFLEX MICROSCOPIC - Abnormal; Notable for the following components:   APPearance CLOUDY (*)  Hgb urine dipstick SMALL (*)    All other components within normal limits  SARS CORONAVIRUS 2 (HOSPITAL ORDER, [facility])  TROPONIN I (HIGH SENSITIVITY)  LACTIC ACID, PLASMA  PREGNANCY, URINE  I-STAT BETA HCG BLOOD, ED ([facility], [facility], [facility] ONLY)    EKG   ED ECG REPORT   Date: 06/21/2019 0047  Rate: 127  Rhythm: sinus tachycardia  QRS Axis: right  Intervals: normal  ST/T Wave abnormalities: normal  Conduction Disutrbances:none  Narrative Interpretation:   Old EKG Reviewed: unchanged  I have personally reviewed the EKG tracing and agree with the computerized printout as noted.  Radiology Dg Chest Port 1 View  Result Date: 06/21/2019 CLINICAL DATA:  Cough EXAM: PORTABLE CHEST 1 VIEW COMPARISON:  May 14, 2018 FINDINGS: The heart size is normal. There is no focal consolidation. No large pleural effusion. No pneumothorax. No acute osseous abnormality. IMPRESSION: No active disease. Electronically Signed   By: Constance Holster M.D.   On: 06/21/2019 02:52    Procedures Procedures   Medications Ordered in ED Medications  sodium chloride flush (NS) 0.9 % injection 3 mL (has no administration in time range)  acetaminophen (TYLENOL) tablet 650 mg (650 mg Oral Given 06/21/19 0225)  sodium chloride 0.9 % bolus 1,000 mL (0 mLs Intravenous Stopped 06/21/19 0454)  ketorolac (TORADOL) 30 MG/ML injection 30 mg (30 mg Intravenous Given 06/21/19 0454)     Initial Impression / Assessment and Plan / ED Course  I have reviewed the triage vital signs and the nursing  notes.  Pertinent labs & imaging results that were available during my care of the patient were reviewed by me and considered in my medical decision making (see chart for details).        4:42 AM Patient presented with fever/sore throat/cough for the past day.  Initial COVID-19 test negative.  However unclear cause of symptoms as chest x-ray is negative. This is clearly an infectious etiology.  Low suspicion for ACS or PE at this time. Covid 19 test could be false negative, so we will ask patient to quarantine at home 6:27 AM Patient improved.  Vital signs improved, no hypoxia with ambulation. No signs of pneumonia.  No signs of sepsis.  I feel she is appropriate for discharge home. Patient feels comfortable for discharge home.  We discussed return precautions.  Discussed need to continue quarantine until symptom free.  She may require further testing if her symptoms do not resolve   [name] was evaluated in Emergency Department on 06/21/2019 for the symptoms described in the history of present illness. She was evaluated in the context of the global COVID-19 pandemic, which necessitated consideration that the patient might be at risk for infection with the SARS-CoV-2 virus that causes COVID-19. Institutional protocols and algorithms that pertain to the evaluation of patients at risk for COVID-19 are in a state of rapid change based on information released by regulatory bodies including the CDC and federal and state organizations. These policies and algorithms were followed during the patient's care in the ED.  Final Clinical Impressions(s) / ED Diagnoses   Final diagnoses:  Viral infection    ED Discharge Orders    None       Ripley Fraise, MD 06/21/19 [phone]

## 2019-06-25 ENCOUNTER — Other Ambulatory Visit: Payer: Self-pay | Admitting: Family Medicine

## 2019-06-25 DIAGNOSIS — B001 Herpesviral vesicular dermatitis: Secondary | ICD-10-CM

## 2019-06-26 NOTE — Telephone Encounter (Signed)
lvm asking pt to return call to schedule appt. Prescription has not been sent to pharmacy

## 2019-07-31 ENCOUNTER — Other Ambulatory Visit: Payer: Self-pay | Admitting: Family Medicine

## 2019-07-31 DIAGNOSIS — M5136 Other intervertebral disc degeneration, lumbar region: Secondary | ICD-10-CM

## 2019-07-31 DIAGNOSIS — F330 Major depressive disorder, recurrent, mild: Secondary | ICD-10-CM

## 2019-08-01 NOTE — Telephone Encounter (Signed)
lvm for pt to schedule appt due to receiving med refill request from pharmacy

## 2019-08-21 ENCOUNTER — Telehealth: Payer: Self-pay | Admitting: Family Medicine

## 2019-08-21 ENCOUNTER — Other Ambulatory Visit: Payer: Self-pay | Admitting: Family Medicine

## 2019-08-21 DIAGNOSIS — B001 Herpesviral vesicular dermatitis: Secondary | ICD-10-CM

## 2019-08-21 MED ORDER — VALACYCLOVIR HCL 1 G PO TABS: 1000.0000 mg | ORAL_TABLET | Freq: Two times a day (BID) | ORAL | 0 refills | Status: DC

## 2019-08-21 NOTE — Telephone Encounter (Signed)
Pt states she has a huge fever blister pop up.  Pt states Dr Ancil Boozer has prescribed  valACYclovir (VALTREX) 1000 MG tablet  20 tabs  Would like to know if she can send to:  [facility] [phone] - [place], [address] AT [place] [phone] (Phone) [phone] (Fax)

## 2019-08-21 NOTE — Telephone Encounter (Signed)
Left a message to notify the patient Dr. Ancil Boozer sent in 2 of the Valtrex to her pharmacy.

## 2019-10-16 ENCOUNTER — Encounter: Payer: 59 | Admitting: Family Medicine

## 2019-10-24 ENCOUNTER — Ambulatory Visit
Admission: EM | Admit: 2019-10-24 | Discharge: 2019-10-24 | Disposition: A | Discharge status: Home / Self Care | Payer: 59 | Attending: Emergency Medicine | Admitting: Emergency Medicine

## 2019-10-24 ENCOUNTER — Other Ambulatory Visit: Payer: Self-pay

## 2019-10-24 DIAGNOSIS — R21 Rash and other nonspecific skin eruption: Secondary | ICD-10-CM | POA: Diagnosis not present

## 2019-10-24 DIAGNOSIS — L928 Other granulomatous disorders of the skin and subcutaneous tissue: Secondary | ICD-10-CM

## 2019-10-24 DIAGNOSIS — L923 Foreign body granuloma of the skin and subcutaneous tissue: Secondary | ICD-10-CM

## 2019-10-24 MED ORDER — METHYLPREDNISOLONE SODIUM SUCC 125 MG IJ SOLR
125.0000 mg | Freq: Once | INTRAMUSCULAR | Status: AC
  Administered 2019-10-24: 125 mg via INTRAMUSCULAR

## 2019-10-24 MED ORDER — HYDROXYZINE HCL 25 MG PO TABS: 25.0000 mg | ORAL_TABLET | Freq: Four times a day (QID) | ORAL | 0 refills | Status: DC

## 2019-10-24 NOTE — ED Triage Notes (Signed)
Pt presents with leg infection to right lower leg after getting tattoo 2 weeks ago, treated  With prednisone and has gotten worse, right lower leg is red and swollen

## 2019-10-24 NOTE — Discharge Instructions (Addendum)
Steroid shot given in office Wash with warm water and mild soap Keep covered Continue with bactrim and prednisone as prescribed and to completion Hydroxyzine prescribed for itching.  Avoid taking prior to driving or operating heavy machinery as this medication may make you drowsy Return or go to the ER if you have any new or worsening symptoms such as fever, chills, nausea, vomiting, redness, swelling, discharge, if symptoms do not improve with medications, etc..Marland Kitchen
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
## 2019-10-25 ENCOUNTER — Telehealth: Payer: Self-pay | Admitting: Family Medicine

## 2019-10-25 ENCOUNTER — Ambulatory Visit (INDEPENDENT_AMBULATORY_CARE_PROVIDER_SITE_OTHER): Payer: 59 | Admitting: Family Medicine

## 2019-10-25 ENCOUNTER — Encounter: Payer: Self-pay | Admitting: Family Medicine

## 2019-10-25 ENCOUNTER — Other Ambulatory Visit (HOSPITAL_COMMUNITY)
Admission: RE | Admit: 2019-10-25 | Discharge: 2019-10-25 | Disposition: A | Discharge status: Home / Self Care | Payer: 59 | Source: Ambulatory Visit | Attending: Family Medicine | Admitting: Family Medicine

## 2019-10-25 VITALS — BP 128/88 | HR 94 | Temp 97.3°F | Resp 16 | Ht 63.25 in | Wt 248.9 lb

## 2019-10-25 DIAGNOSIS — Z131 Encounter for screening for diabetes mellitus: Secondary | ICD-10-CM

## 2019-10-25 DIAGNOSIS — D72829 Elevated white blood cell count, unspecified: Secondary | ICD-10-CM

## 2019-10-25 DIAGNOSIS — Z0000 Encounter for general adult medical examination without abnormal findings: Secondary | ICD-10-CM | POA: Diagnosis not present

## 2019-10-25 DIAGNOSIS — Z1505 Genetic susceptibility to malignant neoplasm of fallopian tube(s): Secondary | ICD-10-CM

## 2019-10-25 DIAGNOSIS — Z124 Encounter for screening for malignant neoplasm of cervix: Secondary | ICD-10-CM | POA: Diagnosis not present

## 2019-10-25 DIAGNOSIS — Z1231 Encounter for screening mammogram for malignant neoplasm of breast: Secondary | ICD-10-CM

## 2019-10-25 DIAGNOSIS — Z79899 Other long term (current) drug therapy: Secondary | ICD-10-CM

## 2019-10-25 DIAGNOSIS — Z113 Encounter for screening for infections with a predominantly sexual mode of transmission: Secondary | ICD-10-CM

## 2019-10-25 DIAGNOSIS — M51369 Other intervertebral disc degeneration, lumbar region without mention of lumbar back pain or lower extremity pain: Secondary | ICD-10-CM

## 2019-10-25 DIAGNOSIS — G43109 Migraine with aura, not intractable, without status migrainosus: Secondary | ICD-10-CM

## 2019-10-25 DIAGNOSIS — Z1502 Genetic susceptibility to malignant neoplasm of ovary: Secondary | ICD-10-CM

## 2019-10-25 DIAGNOSIS — M5136 Other intervertebral disc degeneration, lumbar region: Secondary | ICD-10-CM

## 2019-10-25 DIAGNOSIS — K219 Gastro-esophageal reflux disease without esophagitis: Secondary | ICD-10-CM

## 2019-10-25 DIAGNOSIS — Z1501 Genetic susceptibility to malignant neoplasm of breast: Secondary | ICD-10-CM

## 2019-10-25 DIAGNOSIS — Z23 Encounter for immunization: Secondary | ICD-10-CM | POA: Diagnosis not present

## 2019-10-25 DIAGNOSIS — F330 Major depressive disorder, recurrent, mild: Secondary | ICD-10-CM

## 2019-10-25 DIAGNOSIS — Z1159 Encounter for screening for other viral diseases: Secondary | ICD-10-CM

## 2019-10-25 DIAGNOSIS — Z1509 Genetic susceptibility to other malignant neoplasm: Secondary | ICD-10-CM

## 2019-10-25 DIAGNOSIS — Z13220 Encounter for screening for lipoid disorders: Secondary | ICD-10-CM

## 2019-10-25 MED ORDER — DULOXETINE HCL 60 MG PO CPEP: 60.0000 mg | ORAL_CAPSULE | Freq: Every day | ORAL | 0 refills | Status: DC

## 2019-10-25 MED ORDER — TOPIRAMATE 50 MG PO TABS: 50.0000 mg | ORAL_TABLET | Freq: Two times a day (BID) | ORAL | 0 refills | Status: DC

## 2019-10-25 MED ORDER — ATENOLOL 25 MG PO TABS: 25.0000 mg | ORAL_TABLET | Freq: Every day | ORAL | 3 refills | Status: DC

## 2019-10-25 MED ORDER — OMEPRAZOLE 20 MG PO CPDR: 20.0000 mg | DELAYED_RELEASE_CAPSULE | Freq: Every day | ORAL | 0 refills | Status: DC

## 2019-10-25 NOTE — Telephone Encounter (Signed)
Requested medication (s) are due for refill today: yes  Requested medication (s) are on the active medication list: yes  Last refill:  10/25/2019  Future visit scheduled: yes  Notes to clinic:  Patient requesting 90 day supply   Requested Prescriptions  Pending Prescriptions Disp Refills   topiramate (TOPAMAX) 50 MG tablet [Pharmacy Med Name: TOPIRAMATE 50MG  TABLETS] 180 tablet     Sig: TAKE 1 TABLET BY MOUTH TWICE DAILY. START WITH 1 TABLET DAILY AND GO UP TO TWICE DAILY     Not Delegated - Neurology: Anticonvulsants - topiramate & zonisamide Failed - 10/25/2019 12:41 PM      Failed - This refill cannot be delegated      Failed - CO2 in normal range and within 360 days    CO2  Date Value Ref Range Status  06/21/2019 20 (L) 22 - 32 mmol/L Final         Failed - Valid encounter within last 12 months    Recent Outpatient Visits          Today Mild episode of recurrent major depressive disorder [facility])   [facility] Steele Sizer, MD   1 year ago Mild episode of recurrent major depressive disorder [facility])   [facility] Steele Sizer, MD   1 year ago Cough   [facility] [place], Satira Anis, MD   1 year ago Mild episode of recurrent major depressive disorder [facility])   [facility] Steele Sizer, MD      Future Appointments            In 1 month Steele Sizer, MD [place] in normal range and within 360 days    Creat  Date Value Ref Range Status  04/18/2018 0.91 0.50 - 1.10 mg/dL Final   Creatinine, Ser  Date Value Ref Range Status  06/21/2019 0.77 0.44 - 1.00 mg/dL Final

## 2019-10-25 NOTE — Patient Instructions (Signed)
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
## 2019-10-25 NOTE — Telephone Encounter (Signed)
Pt called back to report that she is transitioning from one job to another and she is requesting this refill for 90 days.

## 2019-10-27 ENCOUNTER — Other Ambulatory Visit: Payer: Self-pay | Admitting: Family Medicine

## 2019-10-27 DIAGNOSIS — G43109 Migraine with aura, not intractable, without status migrainosus: Secondary | ICD-10-CM

## 2019-10-27 MED ORDER — TOPIRAMATE 50 MG PO TABS: 50.0000 mg | ORAL_TABLET | Freq: Two times a day (BID) | ORAL | 0 refills | Status: DC

## 2019-10-28 LAB — HEMOGLOBIN A1C
Hgb A1c MFr Bld: 4.9 % of total Hgb (ref <5.7)
Mean Plasma Glucose: 94 (calc)
eAG (mmol/L): 5.2 (calc)

## 2019-10-28 LAB — COMPLETE METABOLIC PANEL WITH GFR
AG Ratio: 1.3 (calc) (ref 1.0–2.5)
ALT: 11 U/L (ref 6–29)
AST: 14 U/L (ref 10–35)
Albumin: 4.3 g/dL (ref 3.6–5.1)
Alkaline phosphatase (APISO): 53 U/L (ref 31–125)
BUN: 14 mg/dL (ref 7–25)
CO2: 29 mmol/L (ref 20–32)
Calcium: 9.8 mg/dL (ref 8.6–10.2)
Chloride: 101 mmol/L (ref 98–110)
Creat: 0.79 mg/dL (ref 0.50–1.10)
GFR, Est African American: 103 mL/min/{1.73_m2} (ref >=60)
GFR, Est Non African American: 89 mL/min/{1.73_m2} (ref >=60)
Globulin: 3.2 g/dL (calc) (ref 1.9–3.7)
Glucose, Bld: 101 mg/dL — ABNORMAL HIGH (ref 65–99)
Potassium: 4.2 mmol/L (ref 3.5–5.3)
Sodium: 137 mmol/L (ref 135–146)
Total Bilirubin: 0.5 mg/dL (ref 0.2–1.2)
Total Protein: 7.5 g/dL (ref 6.1–8.1)

## 2019-10-28 LAB — CBC WITH DIFFERENTIAL/PLATELET
Absolute Monocytes: 994 cells/uL — ABNORMAL HIGH (ref 200–950)
Basophils Absolute: 43 cells/uL (ref 0–200)
Basophils Relative: 0.3 %
Eosinophils Absolute: 14 cells/uL — ABNORMAL LOW (ref 15–500)
Eosinophils Relative: 0.1 %
HCT: 47.3 % — ABNORMAL HIGH (ref 35.0–45.0)
Hemoglobin: 16.2 g/dL — ABNORMAL HIGH (ref 11.7–15.5)
Lymphs Abs: 2911 cells/uL (ref 850–3900)
MCH: 32.4 pg (ref 27.0–33.0)
MCHC: 34.2 g/dL (ref 32.0–36.0)
MCV: 94.6 fL (ref 80.0–100.0)
MPV: 11 fL (ref 7.5–12.5)
Monocytes Relative: 7 %
Neutro Abs: 10238 cells/uL — ABNORMAL HIGH (ref 1500–7800)
Neutrophils Relative %: 72.1 %
Platelets: 307 10*3/uL (ref 140–400)
RBC: 5 10*6/uL (ref 3.80–5.10)
RDW: 11.8 % (ref 11.0–15.0)
Total Lymphocyte: 20.5 %
WBC: 14.2 10*3/uL — ABNORMAL HIGH (ref 3.8–10.8)

## 2019-10-28 LAB — LIPID PANEL
Cholesterol: 192 mg/dL (ref <200)
HDL: 66 mg/dL (ref >=50)
LDL Cholesterol (Calc): 103 mg/dL (calc) — ABNORMAL HIGH
Non-HDL Cholesterol (Calc): 126 mg/dL (calc) (ref <130)
Total CHOL/HDL Ratio: 2.9 (calc) (ref <5.0)
Triglycerides: 124 mg/dL (ref <150)

## 2019-10-28 LAB — RPR: RPR Ser Ql: NONREACTIVE

## 2019-10-28 LAB — HEPATITIS C ANTIBODY
Hepatitis C Ab: NONREACTIVE
SIGNAL TO CUT-OFF: 0.02 (ref <1.00)

## 2019-10-28 LAB — TSH: TSH: 3.62 mIU/L

## 2019-10-28 LAB — HIV ANTIBODY (ROUTINE TESTING W REFLEX): HIV 1&2 Ab, 4th Generation: NONREACTIVE

## 2019-10-30 ENCOUNTER — Other Ambulatory Visit: Payer: Self-pay | Admitting: Family Medicine

## 2019-10-30 DIAGNOSIS — Z1509 Genetic susceptibility to other malignant neoplasm: Secondary | ICD-10-CM

## 2019-10-30 DIAGNOSIS — Z1501 Genetic susceptibility to malignant neoplasm of breast: Secondary | ICD-10-CM

## 2019-10-30 DIAGNOSIS — D72829 Elevated white blood cell count, unspecified: Secondary | ICD-10-CM

## 2019-10-30 DIAGNOSIS — R718 Other abnormality of red blood cells: Secondary | ICD-10-CM

## 2019-10-30 LAB — CYTOLOGY - PAP
Adequacy: ABSENT
Chlamydia: NEGATIVE
Comment: NEGATIVE
Comment: NEGATIVE
Comment: NORMAL
Diagnosis: NEGATIVE
High risk HPV: NEGATIVE
Neisseria Gonorrhea: NEGATIVE

## 2019-10-30 NOTE — Progress Notes (Unsigned)
lu

## 2019-11-01 ENCOUNTER — Other Ambulatory Visit: Payer: Self-pay

## 2019-11-04 ENCOUNTER — Inpatient Hospital Stay: Payer: 59 | Admitting: Oncology

## 2019-11-11 ENCOUNTER — Other Ambulatory Visit: Payer: Self-pay

## 2019-11-12 ENCOUNTER — Inpatient Hospital Stay: Payer: Self-pay | Attending: Internal Medicine | Admitting: Internal Medicine

## 2019-11-12 ENCOUNTER — Encounter: Payer: Self-pay | Admitting: Internal Medicine

## 2019-11-12 ENCOUNTER — Other Ambulatory Visit: Payer: Self-pay

## 2019-11-12 ENCOUNTER — Inpatient Hospital Stay: Payer: Self-pay

## 2019-11-12 DIAGNOSIS — Z8042 Family history of malignant neoplasm of prostate: Secondary | ICD-10-CM | POA: Insufficient documentation

## 2019-11-12 DIAGNOSIS — R5383 Other fatigue: Secondary | ICD-10-CM | POA: Insufficient documentation

## 2019-11-12 DIAGNOSIS — D751 Secondary polycythemia: Secondary | ICD-10-CM

## 2019-11-12 DIAGNOSIS — F17210 Nicotine dependence, cigarettes, uncomplicated: Secondary | ICD-10-CM | POA: Insufficient documentation

## 2019-11-12 DIAGNOSIS — Z803 Family history of malignant neoplasm of breast: Secondary | ICD-10-CM | POA: Insufficient documentation

## 2019-11-12 DIAGNOSIS — D72829 Elevated white blood cell count, unspecified: Secondary | ICD-10-CM | POA: Insufficient documentation

## 2019-11-12 DIAGNOSIS — Z806 Family history of leukemia: Secondary | ICD-10-CM | POA: Insufficient documentation

## 2019-11-12 DIAGNOSIS — Z801 Family history of malignant neoplasm of trachea, bronchus and lung: Secondary | ICD-10-CM | POA: Insufficient documentation

## 2019-11-12 DIAGNOSIS — Z1501 Genetic susceptibility to malignant neoplasm of breast: Secondary | ICD-10-CM

## 2019-11-12 LAB — CBC WITH DIFFERENTIAL/PLATELET
Abs Immature Granulocytes: 0.03 10*3/uL (ref 0.00–0.07)
Basophils Absolute: 0 10*3/uL (ref 0.0–0.1)
Basophils Relative: 1 %
Eosinophils Absolute: 0.3 10*3/uL (ref 0.0–0.5)
Eosinophils Relative: 4 %
HCT: 43.6 % (ref 36.0–46.0)
Hemoglobin: 14.1 g/dL (ref 12.0–15.0)
Immature Granulocytes: 0 %
Lymphocytes Relative: 23 %
Lymphs Abs: 1.6 10*3/uL (ref 0.7–4.0)
MCH: 31 pg (ref 26.0–34.0)
MCHC: 32.3 g/dL (ref 30.0–36.0)
MCV: 95.8 fL (ref 80.0–100.0)
Monocytes Absolute: 0.5 10*3/uL (ref 0.1–1.0)
Monocytes Relative: 7 %
Neutro Abs: 4.7 10*3/uL (ref 1.7–7.7)
Neutrophils Relative %: 65 %
Platelets: 236 10*3/uL (ref 150–400)
RBC: 4.55 MIL/uL (ref 3.87–5.11)
RDW: 12.6 % (ref 11.5–15.5)
WBC: 7.1 10*3/uL (ref 4.0–10.5)
nRBC: 0 % (ref 0.0–0.2)

## 2019-11-12 LAB — TECHNOLOGIST SMEAR REVIEW
Plt Morphology: NORMAL
RBC Morphology: NORMAL

## 2019-11-12 LAB — C-REACTIVE PROTEIN: CRP: 3.9 mg/dL — ABNORMAL HIGH (ref <1.0)

## 2019-11-12 LAB — LACTATE DEHYDROGENASE: LDH: 181 U/L (ref 98–192)
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
## 2019-11-12 NOTE — Assessment & Plan Note (Addendum)
#  Erythrocytosis/mild Leukocytosis- neutrophilia; Asymptomatic;   I had a long discussion the patient regarding the potential causes of her abnormal blood counts-both primary bone marrow problem versus reactive/secondary causes.  For now I  recommend checking CBC;CMP; LDH; jak 2 BCR ABL; review of peripheral smear. Check peripheral smear.  Bone marrow biopsy could be considered for inconclusive diagnosis.  However, it would less likely patient will need a bone marrow biopsy at this time.  # BRCA carrier [screening]-we will need genetic counseling.  Thank you Dr.Sowles for allowing me to participate in the care of your pleasant patient. Please do not hesitate to contact me with questions or concerns in the interim.   # DISPOSITION:  # Labs today # Follow up in 2 weeks- MD;Video- Dr.B 

## 2019-11-18 LAB — BCR-ABL1 FISH
Cells Analyzed: 200
Cells Counted: 200

## 2019-11-18 LAB — JAK2 GENOTYPR

## 2019-11-26 ENCOUNTER — Ambulatory Visit: Payer: 59 | Admitting: Family Medicine

## 2019-11-26 ENCOUNTER — Inpatient Hospital Stay: Payer: Medicaid Other | Attending: Internal Medicine | Admitting: Internal Medicine

## 2019-11-26 DIAGNOSIS — F329 Major depressive disorder, single episode, unspecified: Secondary | ICD-10-CM | POA: Insufficient documentation

## 2019-11-26 DIAGNOSIS — C482 Malignant neoplasm of peritoneum, unspecified: Secondary | ICD-10-CM | POA: Insufficient documentation

## 2019-11-26 DIAGNOSIS — F419 Anxiety disorder, unspecified: Secondary | ICD-10-CM | POA: Insufficient documentation

## 2019-11-26 DIAGNOSIS — Z800 Family history of malignant neoplasm of digestive organs: Secondary | ICD-10-CM | POA: Insufficient documentation

## 2019-11-26 DIAGNOSIS — Z803 Family history of malignant neoplasm of breast: Secondary | ICD-10-CM | POA: Insufficient documentation

## 2019-11-26 DIAGNOSIS — D751 Secondary polycythemia: Secondary | ICD-10-CM

## 2019-11-26 DIAGNOSIS — Z801 Family history of malignant neoplasm of trachea, bronchus and lung: Secondary | ICD-10-CM | POA: Insufficient documentation

## 2019-11-26 DIAGNOSIS — Z1501 Genetic susceptibility to malignant neoplasm of breast: Secondary | ICD-10-CM | POA: Insufficient documentation

## 2019-11-26 DIAGNOSIS — R180 Malignant ascites: Secondary | ICD-10-CM | POA: Insufficient documentation

## 2019-11-26 DIAGNOSIS — D72829 Elevated white blood cell count, unspecified: Secondary | ICD-10-CM | POA: Insufficient documentation

## 2019-11-26 DIAGNOSIS — Z8042 Family history of malignant neoplasm of prostate: Secondary | ICD-10-CM | POA: Insufficient documentation

## 2019-11-26 DIAGNOSIS — J910 Malignant pleural effusion: Secondary | ICD-10-CM | POA: Insufficient documentation

## 2019-11-26 DIAGNOSIS — F17210 Nicotine dependence, cigarettes, uncomplicated: Secondary | ICD-10-CM | POA: Insufficient documentation

## 2019-11-26 DIAGNOSIS — Z79899 Other long term (current) drug therapy: Secondary | ICD-10-CM | POA: Insufficient documentation

## 2019-11-26 DIAGNOSIS — Z1502 Genetic susceptibility to malignant neoplasm of ovary: Secondary | ICD-10-CM | POA: Insufficient documentation

## 2019-11-26 DIAGNOSIS — R188 Other ascites: Secondary | ICD-10-CM | POA: Insufficient documentation

## 2019-11-26 DIAGNOSIS — C3411 Malignant neoplasm of upper lobe, right bronchus or lung: Secondary | ICD-10-CM | POA: Insufficient documentation

## 2019-11-26 NOTE — Assessment & Plan Note (Signed)
#  Erythrocytosis/mild Leukocytosis- neutrophilia; Asymptomatic;   I had a long discussion the patient regarding the potential causes of her abnormal blood counts-both primary bone marrow problem versus reactive/secondary causes.  For now I  recommend checking CBC;CMP; LDH; jak 2 BCR ABL; review of peripheral smear. Check peripheral smear.  Bone marrow biopsy could be considered for inconclusive diagnosis.  However, it would less likely patient will need a bone marrow biopsy at this time.  # BRCA carrier [screening]-we will need genetic counseling.  Thank you Dr.Sowles for allowing me to participate in the care of your pleasant patient. Please do not hesitate to contact me with questions or concerns in the interim.   # DISPOSITION:  # Labs today # Follow up in 2 weeks- MD;Video- Dr.B

## 2019-11-26 NOTE — Progress Notes (Unsigned)
I connected with [name] on 11/26/19 at  2:45 PM EST by {Blank single:19197::"video enabled telemedicine visit","telephone visit"} and verified that I am speaking with the correct person using two identifiers.  I discussed the limitations, risks, security and privacy concerns of performing an evaluation and management service by telemedicine and the availability of in-person appointments. I also discussed with the patient that there may be a patient responsible charge related to this service. The patient expressed understanding and agreed to proceed.    Other persons participating in the visit and their role in the encounter: RN/medical reconciliation Patient's location: *** Provider's location: Home  Oncology History   No history exists.     Chief Complaint: ***    History of present illness:[name] 48 y.o.  female with history of   Observation/objective:  Assessment and plan: No problem-specific Assessment & Plan notes found for this encounter.    Follow-up instructions:  I discussed the assessment and treatment plan with the patient.  The patient was provided an opportunity to ask questions and all were answered.  The patient agreed with the plan and demonstrated understanding of instructions.  The patient was advised to call back or seek an in person evaluation if the symptoms worsen or if the condition fails to improve as anticipated.  I provided *** minutes of {Blank single:19197::"face-to-face video visit time","non face-to-face telephone visit time"} during this encounter, and > 50% was spent counseling as documented under my assessment & plan.   Dr. Charlaine [place] at [facility] 11/26/2019 8:05 AM

## 2019-12-04 ENCOUNTER — Telehealth: Payer: Self-pay | Admitting: Licensed Clinical Social Worker

## 2019-12-04 NOTE — Telephone Encounter (Signed)
Left message for patient to call and schedule a virtual visit with Dr. B for 1 to 2 weeks.

## 2019-12-04 NOTE — Telephone Encounter (Signed)
-----  Message from Cammie Sickle, MD sent at 12/04/2019  7:31 AM EST ----- C/T-please check if patient wants to make a video appointment in the next 1 to 2 weeks.   FYI-Dr.Sowles .....................................................  Hello Ms. [name]-  I wanted to inform you that-  # your labs are NOT suggestive of any blood cancer [leukemia] related problem.  Your intermittently elevated blood counts are likely from inflammation/smoking etc.  #I would recommend genetic counseling referral/for BRCA.  #We are sorry that you missed her last appointment; please make an appointment as soon as possible to discuss above findings/recommendations in detail.  Please call to discuss.    Thank you/Dr. B/MyChart message

## 2019-12-05 ENCOUNTER — Emergency Department (HOSPITAL_COMMUNITY): Payer: Medicaid Other

## 2019-12-05 ENCOUNTER — Other Ambulatory Visit: Payer: Self-pay

## 2019-12-05 ENCOUNTER — Encounter (HOSPITAL_COMMUNITY): Payer: Self-pay | Admitting: Emergency Medicine

## 2019-12-05 ENCOUNTER — Emergency Department (HOSPITAL_COMMUNITY)
Admission: EM | Admit: 2019-12-05 | Discharge: 2019-12-05 | Disposition: A | Discharge status: Home / Self Care | Payer: Medicaid Other | Attending: Emergency Medicine | Admitting: Emergency Medicine

## 2019-12-05 DIAGNOSIS — Z79899 Other long term (current) drug therapy: Secondary | ICD-10-CM | POA: Insufficient documentation

## 2019-12-05 DIAGNOSIS — J90 Pleural effusion, not elsewhere classified: Secondary | ICD-10-CM | POA: Diagnosis not present

## 2019-12-05 DIAGNOSIS — Z20828 Contact with and (suspected) exposure to other viral communicable diseases: Secondary | ICD-10-CM | POA: Insufficient documentation

## 2019-12-05 DIAGNOSIS — Z720 Tobacco use: Secondary | ICD-10-CM | POA: Diagnosis not present

## 2019-12-05 DIAGNOSIS — R0602 Shortness of breath: Secondary | ICD-10-CM | POA: Diagnosis not present

## 2019-12-05 DIAGNOSIS — R05 Cough: Secondary | ICD-10-CM | POA: Diagnosis not present

## 2019-12-05 LAB — URINALYSIS, ROUTINE W REFLEX MICROSCOPIC
Bilirubin Urine: NEGATIVE
Glucose, UA: NEGATIVE mg/dL
Hgb urine dipstick: NEGATIVE
Ketones, ur: NEGATIVE mg/dL
Leukocytes,Ua: NEGATIVE
Nitrite: NEGATIVE
Protein, ur: NEGATIVE mg/dL
Specific Gravity, Urine: >1.046 — ABNORMAL HIGH (ref 1.005–1.030)
pH: 5 (ref 5.0–8.0)

## 2019-12-05 LAB — COMPREHENSIVE METABOLIC PANEL
ALT: 12 U/L (ref 0–44)
AST: 18 U/L (ref 15–41)
Albumin: 3.1 g/dL — ABNORMAL LOW (ref 3.5–5.0)
Alkaline Phosphatase: 57 U/L (ref 38–126)
Anion gap: 11 (ref 5–15)
BUN: 9 mg/dL (ref 6–20)
CO2: 22 mmol/L (ref 22–32)
Calcium: 9 mg/dL (ref 8.9–10.3)
Chloride: 105 mmol/L (ref 98–111)
Creatinine, Ser: 0.92 mg/dL (ref 0.44–1.00)
GFR calc Af Amer: >60 mL/min (ref >60)
GFR calc non Af Amer: >60 mL/min (ref >60)
Glucose, Bld: 111 mg/dL — ABNORMAL HIGH (ref 70–99)
Potassium: 3.7 mmol/L (ref 3.5–5.1)
Sodium: 138 mmol/L (ref 135–145)
Total Bilirubin: 0.7 mg/dL (ref 0.3–1.2)
Total Protein: 6.7 g/dL (ref 6.5–8.1)

## 2019-12-05 LAB — CBC WITH DIFFERENTIAL/PLATELET
Abs Immature Granulocytes: 0.06 10*3/uL (ref 0.00–0.07)
Basophils Absolute: 0.1 10*3/uL (ref 0.0–0.1)
Basophils Relative: 1 %
Eosinophils Absolute: 0.1 10*3/uL (ref 0.0–0.5)
Eosinophils Relative: 1 %
HCT: 47.2 % — ABNORMAL HIGH (ref 36.0–46.0)
Hemoglobin: 15.5 g/dL — ABNORMAL HIGH (ref 12.0–15.0)
Immature Granulocytes: 1 %
Lymphocytes Relative: 18 %
Lymphs Abs: 1.4 10*3/uL (ref 0.7–4.0)
MCH: 31.1 pg (ref 26.0–34.0)
MCHC: 32.8 g/dL (ref 30.0–36.0)
MCV: 94.8 fL (ref 80.0–100.0)
Monocytes Absolute: 0.6 10*3/uL (ref 0.1–1.0)
Monocytes Relative: 7 %
Neutro Abs: 5.7 10*3/uL (ref 1.7–7.7)
Neutrophils Relative %: 72 %
Platelets: 286 10*3/uL (ref 150–400)
RBC: 4.98 MIL/uL (ref 3.87–5.11)
RDW: 12.3 % (ref 11.5–15.5)
WBC: 7.9 10*3/uL (ref 4.0–10.5)
nRBC: 0 % (ref 0.0–0.2)

## 2019-12-05 LAB — BRAIN NATRIURETIC PEPTIDE: B Natriuretic Peptide: 24 pg/mL (ref 0.0–100.0)

## 2019-12-05 LAB — TROPONIN I (HIGH SENSITIVITY)
Troponin I (High Sensitivity): 4 ng/L (ref <18)
Troponin I (High Sensitivity): 3 ng/L (ref <18)

## 2019-12-05 LAB — I-STAT BETA HCG BLOOD, ED (MC, WL, AP ONLY): I-stat hCG, quantitative: <5 m[IU]/mL (ref <5)

## 2019-12-05 LAB — SARS CORONAVIRUS 2 (TAT 6-24 HRS): SARS Coronavirus 2: NEGATIVE

## 2019-12-05 MED ORDER — IOHEXOL 350 MG/ML SOLN
100.0000 mL | Freq: Once | INTRAVENOUS | Status: AC | PRN
  Administered 2019-12-05: 17:00:00 100 mL via INTRAVENOUS

## 2019-12-05 MED ORDER — ACETAMINOPHEN 500 MG PO TABS
1000.0000 mg | ORAL_TABLET | Freq: Once | ORAL | Status: AC
  Administered 2019-12-05: 1000 mg via ORAL
  Filled 2019-12-05: qty 2

## 2019-12-05 NOTE — ED Provider Notes (Signed)
[facility] EMERGENCY DEPARTMENT Provider Note   CSN: [id number] Arrival date & time: 12/05/19  1243     History Chief Complaint  Patient presents with  . Shortness of Breath    [name] is a 48 y.o. female.  48 y.o female with a PMH of Depression, BRCA + presents to the ED with a chief complaint of shortness of breath x2 weeks.  Patient reports this began with a centralized chest pressure that later resolved, this now has developed into constant shortness of breath.  Patient reports she feels more winded with ambulation.  States prior to arrival took a shower, this took her approximately 20 minutes which is unusual for her.  She does have history of smoking, reports smoking a half a pack daily.  She feels that with every movement she overexerts herself.  Also endorses a cough, this is not out of the ordinary as she causes her smoker's cough.  Does not have any prior history of heart failure, prior history of CAD, prior history of blood clots.  She denies any fevers.   The history is provided by the patient.       Past Medical History:  Diagnosis Date  . 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 Depression   . Family history of breast cancer   . GERD (gastroesophageal reflux disease)   . Menorrhagia   . Migraines   . Osteoarthritis    back  . Plantar fasciitis     Patient Active Problem List   Diagnosis Date Noted  . Erythrocytosis 11/12/2019  . Morbid obesity with BMI of 40.0-44.9, adult ([place]) 07/07/2018  . 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 Fever blister 05/17/2018  . Family history of breast cancer 05/08/2018  . Family history of colon cancer in mother 05/08/2018  . Migraine with aura and without status migrainosus 04/18/2018  . Dysmenorrhea 06/21/2017  . Menorrhagia 06/21/2017  . 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 Esophageal reflux 01/20/2016  . Osteoarthritis, multiple sites 01/20/2016  . Frequent  headaches 01/20/2016    Past Surgical History:  Procedure Laterality Date  . APPENDECTOMY     LSC but "ruptured when they did the surgery"  . CESAREAN SECTION    . TUBAL LIGATION     at time of CSxn  . WRIST SURGERY Left 11/21/2016   plates and screws inserted     OB History    Gravida  5   Para  4   Term  4   Preterm      AB  1   Living  4     SAB      TAB  1   Ectopic      Multiple      Live Births  4           Family History  Adopted: Yes  Problem Relation Age of Onset  . Lung cancer Father        deceased 80  . Breast cancer Mother 42       currently 23  . Colon cancer Mother   . ADD / ADHD Son   . ADD / ADHD Son   . Early death Maternal Aunt   . Breast cancer Maternal Aunt 34       deceased 63  . Breast cancer Maternal Grandmother   . Depression Daughter   . Depression Daughter   . Prostate cancer Paternal Uncle   . Stroke Paternal  Uncle   . Leukemia Paternal Aunt     Social History   Tobacco Use  . Smoking status: Former Smoker    Packs/day: 0.50    Years: 20.00    Pack years: 10.00    Types: Cigarettes    Quit date: 12/2002    Years since quitting: 16.9  . Smokeless tobacco: Never Used  Substance Use Topics  . Alcohol use: Yes    Alcohol/week: 0.0 standard drinks    Comment: Socially  . Drug use: No    Home Medications Prior to Admission medications   Medication Sig Start Date End Date Taking? Authorizing Provider  atenolol (TENORMIN) 25 MG tablet Take 1 tablet (25 mg total) by mouth daily. Patient not taking: Reported on 11/12/2019 10/25/19   Sowles, Krichna, MD  DULoxetine (CYMBALTA) 60 MG capsule Take 1 capsule (60 mg total) by mouth daily. 10/25/19   Sowles, Krichna, MD  fexofenadine (ALLEGRA ALLERGY) 180 MG tablet Take 1 tablet (180 mg total) by mouth daily as needed for allergies or rhinitis. 05/14/18   Lada, Melinda P, MD  hydrOXYzine (ATARAX/VISTARIL) 25 MG tablet Take 1 tablet (25 mg total) by mouth every 6 (six)  hours. 10/24/19   Wurst, Brittany, PA-C  levonorgestrel (MIRENA) 20 MCG/24HR IUD 1 each by Intrauterine route once.    [provider]  omeprazole (PRILOSEC) 20 MG capsule Take 1 capsule (20 mg total) by mouth daily. 10/25/19   Sowles, Krichna, MD  solifenacin (VESICARE) 10 MG tablet  08/20/19   [provider]  topiramate (TOPAMAX) 50 MG tablet Take 1 tablet (50 mg total) by mouth 2 (two) times daily. Start with one daily and go up to two daily 10/27/19   Sowles, Krichna, MD  valACYclovir (VALTREX) 1000 MG tablet Take 1 tablet (1,000 mg total) by mouth 2 (two) times daily. 08/21/19   Sowles, Krichna, MD  ranitidine (ZANTAC) 300 MG tablet Take 1 tablet (300 mg total) by mouth daily as needed for heartburn. 04/18/18 10/24/19  Sowles, Krichna, MD    Allergies    Patient has no known allergies.  Review of Systems   Review of Systems  Constitutional: Negative for chills and fever.  HENT: Negative for ear pain and sore throat.   Eyes: Negative for pain and visual disturbance.  Respiratory: Positive for cough and shortness of breath.   Cardiovascular: Positive for chest pain. Negative for palpitations and leg swelling.  Gastrointestinal: Negative for abdominal pain, diarrhea, nausea and vomiting.  Genitourinary: Negative for dysuria and hematuria.  Musculoskeletal: Negative for arthralgias and back pain.  Skin: Negative for color change and rash.  Neurological: Negative for seizures and syncope.  All other systems reviewed and are negative.   Physical Exam Updated Vital Signs BP (!) 130/92   Pulse (!) 118   Temp 99 F (37.2 C) (Oral)   Resp (!) 22   Ht 5' 3" (1.6 m)   SpO2 99%   BMI 45.24 kg/m   Physical Exam Vitals and nursing note reviewed.  Constitutional:      General: She is not in acute distress.    Appearance: She is well-developed.  HENT:     Head: Normocephalic and atraumatic.     Mouth/Throat:     Pharynx: No oropharyngeal exudate.  Eyes:     Pupils:  Pupils are equal, round, and reactive to light.  Cardiovascular:     Rate and Rhythm: Regular rhythm.     Heart sounds: Normal heart sounds.  Pulmonary:       Effort: Pulmonary effort is normal. No respiratory distress.     Breath sounds: Examination of the right-middle field reveals decreased breath sounds. Examination of the right-lower field reveals decreased breath sounds. Decreased breath sounds present. No wheezing or rhonchi.  Chest:     Chest wall: No tenderness.  Abdominal:     General: Bowel sounds are normal. There is no distension.     Palpations: Abdomen is soft.     Tenderness: There is no abdominal tenderness.  Musculoskeletal:        General: No tenderness or deformity.     Cervical back: Normal range of motion.     Right lower leg: No edema.     Left lower leg: No edema.  Skin:    General: Skin is warm and dry.  Neurological:     Mental Status: She is alert and oriented to person, place, and time.     ED Results / Procedures / Treatments   Labs (all labs ordered are listed, but only abnormal results are displayed) Labs Reviewed  CBC WITH DIFFERENTIAL/PLATELET - Abnormal; Notable for the following components:      Result Value   Hemoglobin 15.5 (*)    HCT 47.2 (*)    All other components within normal limits  COMPREHENSIVE METABOLIC PANEL - Abnormal; Notable for the following components:   Glucose, Bld 111 (*)    Albumin 3.1 (*)    All other components within normal limits  URINALYSIS, ROUTINE W REFLEX MICROSCOPIC  BRAIN NATRIURETIC PEPTIDE  I-STAT BETA HCG BLOOD, ED ([facility], [facility], [facility] ONLY)  TROPONIN I (HIGH SENSITIVITY)  TROPONIN I (HIGH SENSITIVITY)    EKG EKG Interpretation  Date/Time:  Thursday December 05 2019 12:48:29 EST Ventricular Rate:  120 PR Interval:  128 QRS Duration: 64 QT Interval:  298 QTC Calculation: 421 R Axis:   97 Text Interpretation: Sinus tachycardia Rightward axis Low voltage QRS Septal infarct , age undetermined Abnormal ECG No  significant change since last tracing 21 June 2019 Confirmed by Ray, Danielle ([id number]) on 12/05/2019 1:13:35 PM   Radiology DG Chest 2 View  Result Date: 12/05/2019 CLINICAL DATA:  Shortness of breath, cough for few weeks, smoker, hypertension, GERD EXAM: CHEST - 2 VIEW COMPARISON:  06/21/2019 FINDINGS: Normal heart size mediastinal contours. Moderate to large RIGHT pleural effusion and basilar atelectasis, new. LEFT lung clear. No pneumothorax or mass identified. No acute osseous findings. IMPRESSION: New moderate to large RIGHT pleural effusion with significant atelectasis of the lower RIGHT lung. Electronically Signed   By: Mark  Boles M.D.   On: 12/05/2019 14:12    Procedures Procedures (including critical care time)  Medications Ordered in ED Medications  iohexol (OMNIPAQUE) 350 MG/ML injection 100 mL (has no administration in time range)  acetaminophen (TYLENOL) tablet 1,000 mg (1,000 mg Oral Given 12/05/19 1459)    ED Course  I have reviewed the triage vital signs and the nursing notes.  Pertinent labs & imaging results that were available during my care of the patient were reviewed by me and considered in my medical decision making (see chart for details).    MDM Rules/Calculators/A&P   Patient with a past medical history of depression, BRCA positive presents the ED with complaints of shortness of breath for the past 2 weeks.  Patient reports she has been more winded with ambulation, talking.  Reports when she bends over she feels like is very hard for her to catch her breath.  She does endorse a low-grade fever, unknown Tmax.    Patient does currently smoke a half a pack daily, she does report a cough this is not under the ordinary for her without any sputum.  No prior history of blood clot, CAD, family history.  She arrived in the ED tachycardic with a heart rate in the 118, tachypneic, oxygen saturation waxes and wanes between 94 to 96% on room air.  She is currently not on  supplemental oxygen at home.  Xray of her chest showed:  New moderate to large RIGHT pleural effusion with significant  atelectasis of the lower RIGHT lung.     CBC without any signs of infection, considered pneumonia but feel that she needs further workup with CT Angio as I cannot rule out PE. CMP within normal limits. Creatine level and kidney function is normal. First troponin is negative, EKG without signs of infarct or ischemia. Beta is negative.  A BNP is currently pending, she denies any PMH of CHF, but certainly on differential due to large pleural effusion. She was tachypneic during my evaluation, temperature was 99, otherwise hemodynamically stable. CT Angio has been ordered, patient care signed out to incoming team, pending disposition and ambulation for proper disposition.    Portions of this note were generated with Dragon dictation software. Dictation errors may occur despite best attempts at proofreading.  Final Clinical Impression(s) / ED Diagnoses Final diagnoses:  Shortness of breath  Pleural effusion on right    Rx / DC Orders ED Discharge Orders    None       , , PA-C 12/05/19 1623    Dykstra, Richard S, MD 12/06/19 1647  

## 2019-12-05 NOTE — ED Notes (Signed)
SpO2 dropped to 93 while ambulating, then returned to 97 after lying back in bed.

## 2019-12-05 NOTE — ED Triage Notes (Signed)
Pt reports central CP a few weeks ago that has resolved but endorses SOB since the chest pain has resolved. SOB worse with exertion.

## 2019-12-05 NOTE — ED Provider Notes (Signed)
Care assumed from [facility] [place] at shift change with CTA chest pending.   In brief, this patient is a 48 y.o. F with PMH/o HTN who presents for evaluation of SOB and CP x 2 weeks She feels like it is progressively worsening. Her symptoms are worse when exerting herself, such as walking. She has have a history of smoking half a pack of cigarettes daily. Please see note from previous provider for full history/physical exam.     Physical Exam  BP 121/68 (BP Location: Left Arm)   Pulse 82   Temp 99 F (37.2 C) (Oral)   Resp 18   Ht 5\' 3"  (1.6 m)   SpO2 97%   BMI 45.24 kg/m   Physical Exam   Speaking in full sentences without any difficulty.  No evidence of respiratory distress.   ED Course/Procedures     Procedures  Results for orders placed or performed during the hospital encounter of 12/05/19 (from the past 24 hour(s))  CBC with Differential     Status: Abnormal   Collection Time: 12/05/19  2:51 PM  Result Value Ref Range   WBC 7.9 4.0 - 10.5 K/uL   RBC 4.98 3.87 - 5.11 MIL/uL   Hemoglobin 15.5 (H) 12.0 - 15.0 g/dL   HCT 47.2 (H) 36.0 - 46.0 %   MCV 94.8 80.0 - 100.0 fL   MCH 31.1 26.0 - 34.0 pg   MCHC 32.8 30.0 - 36.0 g/dL   RDW 12.3 11.5 - 15.5 %   Platelets 286 150 - 400 K/uL   nRBC 0.0 0.0 - 0.2 %   Neutrophils Relative % 72 %   Neutro Abs 5.7 1.7 - 7.7 K/uL   Lymphocytes Relative 18 %   Lymphs Abs 1.4 0.7 - 4.0 K/uL   Monocytes Relative 7 %   Monocytes Absolute 0.6 0.1 - 1.0 K/uL   Eosinophils Relative 1 %   Eosinophils Absolute 0.1 0.0 - 0.5 K/uL   Basophils Relative 1 %   Basophils Absolute 0.1 0.0 - 0.1 K/uL   Immature Granulocytes 1 %   Abs Immature Granulocytes 0.06 0.00 - 0.07 K/uL  Comprehensive metabolic panel     Status: Abnormal   Collection Time: 12/05/19  2:51 PM  Result Value Ref Range   Sodium 138 135 - 145 mmol/L   Potassium 3.7 3.5 - 5.1 mmol/L   Chloride 105 98 - 111 mmol/L   CO2 22 22 - 32 mmol/L   Glucose, Bld 111 (H) 70 - 99 mg/dL    BUN 9 6 - 20 mg/dL   Creatinine, Ser 0.92 0.44 - 1.00 mg/dL   Calcium 9.0 8.9 - 10.3 mg/dL   Total Protein 6.7 6.5 - 8.1 g/dL   Albumin 3.1 (L) 3.5 - 5.0 g/dL   AST 18 15 - 41 U/L   ALT 12 0 - 44 U/L   Alkaline Phosphatase 57 38 - 126 U/L   Total Bilirubin 0.7 0.3 - 1.2 mg/dL   GFR calc non Af Amer >60 >60 mL/min   GFR calc Af Amer >60 >60 mL/min   Anion gap 11 5 - 15  Troponin I (High Sensitivity)     Status: None   Collection Time: 12/05/19  2:51 PM  Result Value Ref Range   Troponin I (High Sensitivity) 4 <18 ng/L  Brain natriuretic peptide     Status: None   Collection Time: 12/05/19  2:51 PM  Result Value Ref Range   B Natriuretic Peptide 24.0 0.0 -  100.0 pg/mL  I-Stat Beta hCG blood, ED ([facility], [facility], [facility] only)     Status: None   Collection Time: 12/05/19  2:58 PM  Result Value Ref Range   I-stat hCG, quantitative <5.0 <5 mIU/mL   Comment 3          Troponin I (High Sensitivity)     Status: None   Collection Time: 12/05/19  4:14 PM  Result Value Ref Range   Troponin I (High Sensitivity) 3 <18 ng/L  Urinalysis, Routine w reflex microscopic     Status: Abnormal   Collection Time: 12/05/19  4:47 PM  Result Value Ref Range   Color, Urine AMBER (A) YELLOW   APPearance CLOUDY (A) CLEAR   Specific Gravity, Urine >1.046 (H) 1.005 - 1.030   pH 5.0 5.0 - 8.0   Glucose, UA NEGATIVE NEGATIVE mg/dL   Hgb urine dipstick NEGATIVE NEGATIVE   Bilirubin Urine NEGATIVE NEGATIVE   Ketones, ur NEGATIVE NEGATIVE mg/dL   Protein, ur NEGATIVE NEGATIVE mg/dL   Nitrite NEGATIVE NEGATIVE   Leukocytes,Ua NEGATIVE NEGATIVE      MDM   On initial ED arrival, she is tachypneaic and tachycardic. O2 sats are between 94-99% on RA.   CBC shows no leukocytosis. Hgb is stable at 15.5. CMP shows normal BUN and Cr. I-stat beta is negative. Trop is negative.    PLAN:  Patient pending CTA of chest for evaluation    MDM:  CTA shows no evidence of PE.  There is a large right pleural effusion with  associated passive atelectasis of the right middle and lower lobes.  More consolidative appearing opacity noted in the right upper lobe with some internal hypoattenuation.  Findings could reflect infectious versus inflammatory process.  There is also some upper abdominal ascites.  They recommend correlation with liver function tests.  LFTs are within normal limit.  Discussed patient with Dr. Anselm Pancoast (IR).  Patient could get thoracentesis done.  He will arrange scheduling.  Discussed with IR.  They have arranged for her to have an appointment at 10 AM.  Patient can either be admitted and get done during admission or come back as an outpatient.  I discussed at length with patient regarding treatment options.  I offered admission versus having patient go home and come in on outpatient.  At this time, patient wishes to go home and come back tomorrow.  She is hemodynamically stable.  Her oxygen saturation has maintained between 92-96% on room air. COVID test ordered. Discussed patient with Dr. Roslynn Amble who is agreeable to plan.   Patient ambulated in the ED while maintaining O2 sats of 93%.  Patient states she feels good.  She is otherwise hemodynamically stable.  I again discussed at length with patient regarding options.  I offered to admit patient.  We had extensive discussion and engaged in shared decision making.  I discussed with her the risk versus benefits of going home and coming back on an outpatient, including but not limited to worsening condition, death.  Patient expresses full understanding of risk first benefits and wishes to go home tonight and then come back tomorrow morning for the procedure.  She appears clinically sober and has full medical decision-making capacity.  Instructed patient that she is to come to the main hospital tomorrow at 10 AM for her procedure.  I stressed the importance of returning to the emergency department if his symptoms worsen in the meantime. Patient had ample  opportunity for questions and discussion. All patient's questions were  answered with full understanding. Strict return precautions discussed. Patient expresses understanding and agreement to plan.    1. Shortness of breath   2. Pleural effusion on right   3. Pleural effusion, right     Portions of this note were generated with Dragon dictation software. Dictation errors may occur despite best attempts at proofreading.   Volanda Napoleon, PA-C 12/05/19 2256    Lucrezia Starch, MD 12/06/19 [phone]

## 2019-12-05 NOTE — Discharge Instructions (Signed)
As we discussed, you will return to the main hospital for procedure at 10 AM.  You should be here between 930 and 9:45 AM so that they may direct you to the right spot.  He should not eat anything past midnight.  As we discussed, if you go home and have any worsening symptoms or concerns, i such as difficulty breathing, chest pain mmediately return to the emergency department.

## 2019-12-06 ENCOUNTER — Inpatient Hospital Stay (HOSPITAL_COMMUNITY): Admission: RE | Admit: 2019-12-06 | Payer: Self-pay | Source: Ambulatory Visit

## 2019-12-06 ENCOUNTER — Ambulatory Visit (HOSPITAL_COMMUNITY)
Admission: RE | Admit: 2019-12-06 | Discharge: 2019-12-06 | Disposition: A | Discharge status: Home / Self Care | Payer: Medicaid Other | Source: Ambulatory Visit | Attending: Physician Assistant | Admitting: Physician Assistant

## 2019-12-06 ENCOUNTER — Ambulatory Visit (HOSPITAL_COMMUNITY)
Admission: RE | Admit: 2019-12-06 | Discharge: 2019-12-06 | Disposition: A | Discharge status: Home / Self Care | Payer: Medicaid Other | Source: Ambulatory Visit | Attending: Radiology | Admitting: Radiology

## 2019-12-06 ENCOUNTER — Other Ambulatory Visit (HOSPITAL_COMMUNITY): Payer: Self-pay | Admitting: Physician Assistant

## 2019-12-06 ENCOUNTER — Other Ambulatory Visit: Payer: Self-pay

## 2019-12-06 ENCOUNTER — Encounter (HOSPITAL_COMMUNITY): Payer: Self-pay

## 2019-12-06 DIAGNOSIS — C801 Malignant (primary) neoplasm, unspecified: Secondary | ICD-10-CM | POA: Insufficient documentation

## 2019-12-06 DIAGNOSIS — J90 Pleural effusion, not elsewhere classified: Secondary | ICD-10-CM

## 2019-12-06 DIAGNOSIS — J910 Malignant pleural effusion: Secondary | ICD-10-CM | POA: Diagnosis not present

## 2019-12-06 HISTORY — PX: IR THORACENTESIS ASP PLEURAL SPACE W/IMG GUIDE: IMG5380

## 2019-12-06 LAB — ALBUMIN, PLEURAL OR PERITONEAL FLUID: Albumin, Fluid: 2.4 g/dL

## 2019-12-06 LAB — BODY FLUID CELL COUNT WITH DIFFERENTIAL
Eos, Fluid: 0 %
Lymphs, Fluid: 44 %
Monocyte-Macrophage-Serous Fluid: 55 % (ref 50–90)
Neutrophil Count, Fluid: 1 % (ref 0–25)
Total Nucleated Cell Count, Fluid: 9394 cu mm — ABNORMAL HIGH (ref 0–1000)

## 2019-12-06 LAB — LACTATE DEHYDROGENASE, PLEURAL OR PERITONEAL FLUID: LD, Fluid: 1086 U/L — ABNORMAL HIGH (ref 3–23)

## 2019-12-06 MED ORDER — LIDOCAINE HCL 1 % IJ SOLN
INTRAMUSCULAR | Status: AC
  Filled 2019-12-06: qty 20

## 2019-12-06 MED ORDER — LIDOCAINE HCL (PF) 1 % IJ SOLN
INTRAMUSCULAR | Status: DC | PRN
  Administered 2019-12-06: 10 mL

## 2019-12-06 NOTE — Procedures (Signed)
PROCEDURE SUMMARY:  Successful US guided right thoracentesis. Yielded 1.2 L of hazy amber fluid. Pt tolerated procedure well. No immediate complications.  Specimen was sent for labs. CXR ordered.  EBL < 5 mL  Ascencion Dike PA-C 12/06/2019 11:58 AM

## 2019-12-07 ENCOUNTER — Encounter (HOSPITAL_COMMUNITY): Payer: Self-pay | Admitting: *Deleted

## 2019-12-07 ENCOUNTER — Other Ambulatory Visit: Payer: Self-pay

## 2019-12-07 ENCOUNTER — Emergency Department (HOSPITAL_COMMUNITY)
Admission: EM | Admit: 2019-12-07 | Discharge: 2019-12-07 | Disposition: A | Discharge status: Home / Self Care | Payer: Medicaid Other | Attending: Emergency Medicine | Admitting: Emergency Medicine

## 2019-12-07 DIAGNOSIS — R109 Unspecified abdominal pain: Secondary | ICD-10-CM | POA: Diagnosis not present

## 2019-12-07 DIAGNOSIS — Z5321 Procedure and treatment not carried out due to patient leaving prior to being seen by health care provider: Secondary | ICD-10-CM | POA: Diagnosis not present

## 2019-12-07 NOTE — ED Triage Notes (Signed)
The pt is here with abd pain for 2 weeks  She was seen here for the same 2-3 days ago

## 2019-12-07 NOTE — ED Notes (Signed)
Patient left and did not want to stay any longer to wait. Patient left at 10:15pm

## 2019-12-07 NOTE — ED Provider Notes (Signed)
Patient contacted the ED regarding the results of her thoracentesis.  Attempted to contact patient to discuss her results of her thoracentesis and possibility of needing to come back in.  I was unable to get in contact with her.  Her phone went to voicemail.   Volanda Napoleon, PA-C 12/07/19 1557    Lucrezia Starch, MD 12/08/19 [phone]

## 2019-12-09 ENCOUNTER — Telehealth: Payer: Self-pay | Admitting: Family Medicine

## 2019-12-09 ENCOUNTER — Telehealth: Payer: Self-pay | Admitting: Internal Medicine

## 2019-12-09 NOTE — Telephone Encounter (Signed)
Breathing is better since fluid has been taken off. She thinks she has fluid in her upper abdomen. Abdomen feels bloated and full. She can not eat. This started a few weeks ago, she started hurting in the middle of her chest, that soon resolved. Then she started having sob with exertion even with just going to the bathroom. She is not sure what she needs to do. No additional information was given. She sees information on her CT scan and that was never discussed. She was there for 8.5 hours and nothing was resolved.

## 2019-12-09 NOTE — Telephone Encounter (Signed)
#  I Spoke to patient regarding results of the + recent blood work-no evidence of any leukemia.  In fact patient WBC was normal.  #BRCA status-patient s/p genetic counseling at [facility].   #Right sided pleural effusion-unclear etiology-awaiting work-up.  #For now patient to follow-up with PCP.  Patient advised to follow-up with oncology as needed.  FYI-Dr.Sowles.

## 2019-12-09 NOTE — Telephone Encounter (Signed)
Patient called and would like a call back from Dr. Ancil Boozer or her CMA about her visit to the ER. She said she had fluid in her chest of 2 pounds and she isn't feeling any better. She wants to know what else she can do. Please call patient back, thanks.

## 2019-12-10 ENCOUNTER — Other Ambulatory Visit: Payer: Self-pay

## 2019-12-10 ENCOUNTER — Ambulatory Visit: Payer: Medicaid Other | Admitting: Family Medicine

## 2019-12-10 ENCOUNTER — Telehealth: Payer: Self-pay

## 2019-12-10 ENCOUNTER — Encounter: Payer: Self-pay | Admitting: Family Medicine

## 2019-12-10 ENCOUNTER — Encounter: Payer: Self-pay | Admitting: Internal Medicine

## 2019-12-10 VITALS — BP 104/80 | HR 82 | Temp 96.6°F | Resp 16 | Ht 63.25 in | Wt 259.0 lb

## 2019-12-10 DIAGNOSIS — J90 Pleural effusion, not elsewhere classified: Secondary | ICD-10-CM

## 2019-12-10 DIAGNOSIS — C569 Malignant neoplasm of unspecified ovary: Secondary | ICD-10-CM

## 2019-12-10 DIAGNOSIS — F418 Other specified anxiety disorders: Secondary | ICD-10-CM | POA: Diagnosis not present

## 2019-12-10 DIAGNOSIS — C801 Malignant (primary) neoplasm, unspecified: Secondary | ICD-10-CM | POA: Diagnosis not present

## 2019-12-10 DIAGNOSIS — R188 Other ascites: Secondary | ICD-10-CM | POA: Diagnosis not present

## 2019-12-10 HISTORY — DX: Malignant neoplasm of unspecified ovary: C56.9

## 2019-12-10 LAB — CYTOLOGY - NON PAP

## 2019-12-10 MED ORDER — HYDROXYZINE HCL 10 MG PO TABS: 10.0000 mg | ORAL_TABLET | Freq: Four times a day (QID) | ORAL | 0 refills | Status: DC

## 2019-12-10 NOTE — Progress Notes (Signed)
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   Leukocytes,Ua NEGATIVE NEGATIVE    Comment: Performed at [facility] [address] [place], [place] [address]  SARS CORONAVIRUS 2 (TAT 6-24 HRS) Nasopharyngeal Nasopharyngeal Swab     Status: None   Collection Time: 12/05/19  5:02 PM   Specimen: Nasopharyngeal Swab  Result Value Ref Range   SARS Coronavirus 2 NEGATIVE NEGATIVE    Comment: (NOTE) SARS-CoV-2 target nucleic acids are NOT DETECTED. The SARS-CoV-2 RNA is generally detectable in upper and lower respiratory specimens  during the acute phase of infection. Negative results do not preclude SARS-CoV-2 infection, do not rule out co-infections with other pathogens, and should not be used as the sole basis for treatment or other patient management decisions. Negative results must be combined with clinical observations, patient history, and epidemiological information. The expected result is Negative. Fact Sheet for Patients: SugarRoll.be Fact Sheet for Healthcare Providers: https://www.woods-mathews.com/ This test is not yet approved or cleared by the [place] FDA and  has been authorized for detection and/or diagnosis of SARS-CoV-2 by FDA under an Emergency Use Authorization (EUA). This EUA will remain  in effect (meaning this test can be used) for the duration of the COVID-19 declaration under Section 56 4(b)(1) of the Act, 21 U.S.C. section 360bbb-3(b)(1), unless the authorization is terminated or revoked sooner. Performed at [facility], [place] [address], [place], [place] [address]   Lactate dehydrogenase (pleural or peritoneal fluid)     Status: Abnormal   Collection Time: 12/06/19 12:19 PM  Result Value Ref Range   LD, Fluid 1,086 (H) 3 - 23 U/L    Comment: (NOTE) Results should be evaluated in conjunction with serum values    Fluid Type-FLDH CYTO PLEU RIGHT     Comment: Performed at [facility] [address] [place], [place] [address] CORRECTED ON 12/11 AT 1253: PREVIOUSLY REPORTED AS CYTO PLEU   Body fluid cell count with differential     Status: Abnormal   Collection Time: 12/06/19 12:19 PM  Result Value Ref Range   Fluid Type-FCT CYTO PLEU RIGHT     Comment: CORRECTED ON 12/11 AT 1253: PREVIOUSLY REPORTED AS CYTO PLEU   Color, Fluid RED (A) YELLOW   Appearance, Fluid CLOUDY (A) CLEAR   Total Nucleated Cell Count, Fluid 9,394 (H) 0 - 1,000 cu mm   Neutrophil Count, Fluid 1 0 - 25 %   Lymphs, Fluid 44 %    Monocyte-Macrophage-Serous Fluid 55 50 - 90 %   Eos, Fluid 0 %    Comment: Performed at [facility], [place] [address] [place], [place] [address]  Albumin, pleural or peritoneal fluid     Status: None   Collection Time: 12/06/19 12:19 PM  Result Value Ref Range   Albumin, Fluid 2.4 g/dL    Comment: (NOTE) No normal range established for this test Results  should be evaluated in conjunction with serum values    Fluid Type-FALB CYTO PLEU RIGHT     Comment: Performed at [facility], [place] [address], [place], [place] [address] CORRECTED ON 12/11 AT 1253: PREVIOUSLY REPORTED AS CYTO PLEU   Fungus Culture With Stain     Status: None (Preliminary result)   Collection Time: 12/06/19 12:19 PM   Specimen: PATH Cytology Pleural fluid  Result Value Ref Range   Fungus Stain Final report     Comment: (NOTE) Performed At: [facility] [place], [place] [id number] Rush Farmer MD OX:[id number]    Fungus (Mycology) Culture PENDING    Fungal Source PLEURAL     Comment: Performed at [facility], [place] [address], [place], [place] [address]  Fungus Culture Result     Status: None   Collection Time: 12/06/19 12:19 PM  Result Value Ref Range   Result 1 Comment     Comment: (NOTE) KOH/Calcofluor preparation:  no fungus observed. Performed At: [facility] [place], [place] [id number] Rush Farmer MD XQ:[id number]   Cytology - Non PAP;     Status: None   Collection Time: 12/06/19 12:19 PM  Result Value Ref Range   CYTOLOGY - NON GYN      CYTOLOGY - NON PAP CASE: [id number] PATIENT: Haynes Hoehn Non-Gynecological Cytology Report     Clinical History: Specimen Submitted:  A. PLEURAL FLUID, RIGHT, THORACENTESIS:   FINAL MICROSCOPIC DIAGNOSIS: - Malignant cells present  SPECIMEN ADEQUACY: Satisfactory for evaluation  DIAGNOSTIC COMMENTS: The malignant cells are positive with cytokeratin 7 and PAX 8 and show patchy  positivity with WT-1 and cytokeration 5/6. The cells are negative with cytokeratin 20, estrogen receptor, progesterone receptor, GATA3, GCDFP, napsin A, TTF-1 and calretinin. The immunophenotype is non specific. The morphology favors adenocarcinoma.  Dr. Tresa Moore agrees.  GROSS: Received is/are 1200 cc of reddish, brown cloudy fluid (SA:sa) Smears: 0 Concentration Method (Thin Prep): 1 Cell Block: Conventional Additional Studies: Received 2 slides labeled [id number]     Final Diagnosis performed by Claudette Laws, MD.   Electronically signed 12/10/2019 Technical component perfor med at [employer], [place] [address] [place], [place] [address].  Professional component performed at [facility], [place] [address], [place], [place] [address].  Immunohistochemistry Technical component (if applicable) was performed at [facility]. [address] [place], [place], [place] [address].   IMMUNOHISTOCHEMISTRY DISCLAIMER (if applicable): Some of these immunohistochemical stains may have been developed and the performance characteristics determine by [facility]. Some may not have been cleared or approved by the U.S. Food and Drug Administration. The FDA has determined that such clearance or approval is not necessary. This test is used for clinical purposes. It should not be regarded as investigational or for research. This laboratory is certified under the [place] (CLIA-88) as qualified to perform high complexity clinical laboratory testing.  The con trols stained appropriately.       PHQ2/9: Depression screen [facility] 2/9 12/10/2019 10/25/2019 05/17/2018 05/14/2018 04/18/2018  Decreased Interest 1 0 1 0 0  Down, Depressed, Hopeless 2 0 1 0 1  PHQ - 2 Score 3 0 2 0 1  Altered sleeping 1 3 0 - 1  Tired, decreased energy '1 1 1 '$ - 1  Change in appetite 1 0 0 - 1  Feeling bad or failure about  yourself  0 1 0 - 1  Trouble  concentrating 1 0 1 - 0  Moving slowly or fidgety/restless 0 0 0 - 0  Suicidal thoughts 0 0 0 - 0  PHQ-9 Score '7 5 4 '$ - 5  Difficult doing work/chores Somewhat difficult Somewhat difficult Somewhat difficult - Somewhat difficult    phq 9 is positive and negative   Fall Risk: Fall Risk  12/10/2019 10/25/2019 05/17/2018 05/17/2018 05/14/2018  Falls in the past year? 0 0 No No No  Number falls in past yr: 0 0 - - -  Injury with Fall? 0 0 - - -      Assessment & Plan  1. Cancer [facility])  Diagnosed from cytology test of pleural fluid  2. Anxiety about health  - hydrOXYzine (ATARAX/VISTARIL) 10 MG tablet; Take 1-2 tablets (10-20 mg total) by mouth every 6 (six) hours.  Dispense: 60 tablet; Refill: 0  3. Pleural effusion on right   4. Other ascites  Going to se oncologist tomorrow

## 2019-12-10 NOTE — Telephone Encounter (Signed)
Copied from [place] [id number]. Topic: General - Call Back - No Documentation >> Dec 10, 2019  3:41 PM Erick Blinks wrote: Reason for CRM: Pt called requesting a doctor's note stating that Ella Jubilee to her first cancer appt. Pt's daughter needs this for work. Can be uploaded on [place] contact: [phone]

## 2019-12-11 ENCOUNTER — Ambulatory Visit
Admission: RE | Admit: 2019-12-11 | Discharge: 2019-12-11 | Disposition: A | Discharge status: Home / Self Care | Payer: Medicaid Other | Source: Ambulatory Visit | Attending: Internal Medicine | Admitting: Internal Medicine

## 2019-12-11 ENCOUNTER — Other Ambulatory Visit: Payer: Self-pay

## 2019-12-11 ENCOUNTER — Encounter: Payer: Self-pay | Admitting: Internal Medicine

## 2019-12-11 ENCOUNTER — Inpatient Hospital Stay (HOSPITAL_BASED_OUTPATIENT_CLINIC_OR_DEPARTMENT_OTHER): Payer: Medicaid Other | Admitting: Internal Medicine

## 2019-12-11 ENCOUNTER — Inpatient Hospital Stay: Payer: Medicaid Other

## 2019-12-11 VITALS — BP 121/85 | HR 118 | Temp 98.0°F | Resp 20 | Ht 63.5 in | Wt 257.0 lb

## 2019-12-11 DIAGNOSIS — F17210 Nicotine dependence, cigarettes, uncomplicated: Secondary | ICD-10-CM | POA: Diagnosis not present

## 2019-12-11 DIAGNOSIS — C3411 Malignant neoplasm of upper lobe, right bronchus or lung: Secondary | ICD-10-CM | POA: Diagnosis not present

## 2019-12-11 DIAGNOSIS — R180 Malignant ascites: Secondary | ICD-10-CM | POA: Diagnosis not present

## 2019-12-11 DIAGNOSIS — C482 Malignant neoplasm of peritoneum, unspecified: Secondary | ICD-10-CM | POA: Diagnosis present

## 2019-12-11 DIAGNOSIS — Z801 Family history of malignant neoplasm of trachea, bronchus and lung: Secondary | ICD-10-CM | POA: Diagnosis not present

## 2019-12-11 DIAGNOSIS — F329 Major depressive disorder, single episode, unspecified: Secondary | ICD-10-CM | POA: Diagnosis not present

## 2019-12-11 DIAGNOSIS — J90 Pleural effusion, not elsewhere classified: Secondary | ICD-10-CM

## 2019-12-11 DIAGNOSIS — J910 Malignant pleural effusion: Secondary | ICD-10-CM | POA: Diagnosis not present

## 2019-12-11 DIAGNOSIS — C801 Malignant (primary) neoplasm, unspecified: Secondary | ICD-10-CM | POA: Insufficient documentation

## 2019-12-11 DIAGNOSIS — Z800 Family history of malignant neoplasm of digestive organs: Secondary | ICD-10-CM | POA: Diagnosis not present

## 2019-12-11 DIAGNOSIS — D72829 Elevated white blood cell count, unspecified: Secondary | ICD-10-CM | POA: Diagnosis not present

## 2019-12-11 DIAGNOSIS — Z803 Family history of malignant neoplasm of breast: Secondary | ICD-10-CM | POA: Diagnosis not present

## 2019-12-11 DIAGNOSIS — Z8042 Family history of malignant neoplasm of prostate: Secondary | ICD-10-CM | POA: Diagnosis not present

## 2019-12-11 DIAGNOSIS — F419 Anxiety disorder, unspecified: Secondary | ICD-10-CM | POA: Diagnosis not present

## 2019-12-11 DIAGNOSIS — Z1502 Genetic susceptibility to malignant neoplasm of ovary: Secondary | ICD-10-CM | POA: Diagnosis not present

## 2019-12-11 DIAGNOSIS — Z79899 Other long term (current) drug therapy: Secondary | ICD-10-CM | POA: Diagnosis not present

## 2019-12-11 DIAGNOSIS — R188 Other ascites: Secondary | ICD-10-CM | POA: Diagnosis not present

## 2019-12-11 DIAGNOSIS — Z1501 Genetic susceptibility to malignant neoplasm of breast: Secondary | ICD-10-CM | POA: Diagnosis not present

## 2019-12-11 HISTORY — DX: Malignant neoplasm of upper lobe, right bronchus or lung: C34.11

## 2019-12-11 LAB — COMPREHENSIVE METABOLIC PANEL
ALT: 11 U/L (ref 0–44)
AST: 14 U/L — ABNORMAL LOW (ref 15–41)
Albumin: 3.2 g/dL — ABNORMAL LOW (ref 3.5–5.0)
Alkaline Phosphatase: 53 U/L (ref 38–126)
Anion gap: 10 (ref 5–15)
BUN: 9 mg/dL (ref 6–20)
CO2: 21 mmol/L — ABNORMAL LOW (ref 22–32)
Calcium: 8.6 mg/dL — ABNORMAL LOW (ref 8.9–10.3)
Chloride: 104 mmol/L (ref 98–111)
Creatinine, Ser: 0.73 mg/dL (ref 0.44–1.00)
GFR calc Af Amer: >60 mL/min (ref >60)
GFR calc non Af Amer: >60 mL/min (ref >60)
Glucose, Bld: 107 mg/dL — ABNORMAL HIGH (ref 70–99)
Potassium: 3.5 mmol/L (ref 3.5–5.1)
Sodium: 135 mmol/L (ref 135–145)
Total Bilirubin: 0.7 mg/dL (ref 0.3–1.2)
Total Protein: 7 g/dL (ref 6.5–8.1)

## 2019-12-11 LAB — GRAM STAIN

## 2019-12-11 LAB — CBC WITH DIFFERENTIAL/PLATELET
Abs Immature Granulocytes: 0.07 10*3/uL (ref 0.00–0.07)
Basophils Absolute: 0.1 10*3/uL (ref 0.0–0.1)
Basophils Relative: 1 %
Eosinophils Absolute: 0.2 10*3/uL (ref 0.0–0.5)
Eosinophils Relative: 2 %
HCT: 47.1 % — ABNORMAL HIGH (ref 36.0–46.0)
Hemoglobin: 15.3 g/dL — ABNORMAL HIGH (ref 12.0–15.0)
Immature Granulocytes: 1 %
Lymphocytes Relative: 17 %
Lymphs Abs: 1.8 10*3/uL (ref 0.7–4.0)
MCH: 30.2 pg (ref 26.0–34.0)
MCHC: 32.5 g/dL (ref 30.0–36.0)
MCV: 93.1 fL (ref 80.0–100.0)
Monocytes Absolute: 0.7 10*3/uL (ref 0.1–1.0)
Monocytes Relative: 7 %
Neutro Abs: 7.4 10*3/uL (ref 1.7–7.7)
Neutrophils Relative %: 72 %
Platelets: 348 10*3/uL (ref 150–400)
RBC: 5.06 MIL/uL (ref 3.87–5.11)
RDW: 12 % (ref 11.5–15.5)
WBC: 10.3 10*3/uL (ref 4.0–10.5)
nRBC: 0 % (ref 0.0–0.2)

## 2019-12-11 LAB — PROTEIN, PLEURAL OR PERITONEAL FLUID: Total protein, fluid: 4.7 g/dL

## 2019-12-11 LAB — BODY FLUID CELL COUNT WITH DIFFERENTIAL
Eos, Fluid: 0 %
Lymphs, Fluid: 60 %
Monocyte-Macrophage-Serous Fluid: 16 %
Neutrophil Count, Fluid: 24 %
Total Nucleated Cell Count, Fluid: 2287 cu mm

## 2019-12-11 LAB — LACTATE DEHYDROGENASE: LDH: 207 U/L — ABNORMAL HIGH (ref 98–192)

## 2019-12-11 LAB — LACTATE DEHYDROGENASE, PLEURAL OR PERITONEAL FLUID: LD, Fluid: 481 U/L — ABNORMAL HIGH (ref 3–23)

## 2019-12-11 LAB — PROTIME-INR
INR: 1.1 (ref 0.8–1.2)
Prothrombin Time: 13.7 seconds (ref 11.4–15.2)

## 2019-12-11 LAB — APTT: aPTT: 32 seconds (ref 24–36)

## 2019-12-11 NOTE — Procedures (Signed)
Interventional Radiology Procedure:   Indications: Lung cancer and ascites  Procedure: US guided paracentesis  Findings: Removed 2400 ml from left lower quadrant  Complications: None     EBL: Less than 10 ml   Isma Tietje R. Anselm Pancoast, MD  Pager: [phone]
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
## 2019-12-11 NOTE — Assessment & Plan Note (Addendum)
#  Adenocarcinoma-cytology/thoracentesis nonspecific primary based on immunohistochemistry; given the lung mass-suspicious for lung cancer.  Recommend PET scan; tumor markers for further evaluation.  #Long discussion with the patient and daughter regarding-advanced age of malignancy/stage IV.  In general treatments are palliative not curative. Based on current clinical consideration of lung primary-chemoimmunotherapy would be recommended.  However-NGS-would be needed; ? on cytology.  We discussed with pathology.  Patient also need brain MRI; will discuss at subsequent visits.  #Abdominal distention-ascites on exam; check ultrasound of the abdomen stat; possible paracentesis.  #Right-sided pleural effusion-malignant; check chest x-ray today.  Discussed regarding Pleurx catheter/pleurodesis.  #BRCA1-no obvious evidence of breast malignancy/ovarian malignancy.  Discussed that lung cancer typically not associated with BRCA type malignancies.   #Patient emotionally distressed obviously-offered support.  Make referral to social work given lack of insurance.  # DISPOSITION: # PET scan ASAP. # US abdomen STAT; possible paracentesis # CXR today # social work referral # labs today # Follow up TBD- Dr.B

## 2019-12-12 ENCOUNTER — Encounter: Payer: Self-pay | Admitting: *Deleted

## 2019-12-12 ENCOUNTER — Telehealth: Payer: Self-pay | Admitting: *Deleted

## 2019-12-12 ENCOUNTER — Telehealth (HOSPITAL_COMMUNITY): Payer: Self-pay

## 2019-12-12 ENCOUNTER — Telehealth: Payer: Self-pay | Admitting: Internal Medicine

## 2019-12-12 LAB — CANCER ANTIGEN 15-3: CA 15-3: 70.3 U/mL — ABNORMAL HIGH (ref 0.0–25.0)

## 2019-12-12 LAB — CA 125: Cancer Antigen (CA) 125: 8009 U/mL — ABNORMAL HIGH (ref 0.0–38.1)

## 2019-12-12 LAB — CANCER ANTIGEN 27.29: CA 27.29: 86.3 U/mL — ABNORMAL HIGH (ref 0.0–38.6)

## 2019-12-12 MED ORDER — ALPRAZOLAM 0.25 MG PO TABS: 0.2500 mg | ORAL_TABLET | Freq: Two times a day (BID) | ORAL | 0 refills | Status: DC | PRN

## 2019-12-12 NOTE — Telephone Encounter (Signed)
Pt called. Very tearful. States that she is having anxiety attacks. She is also concerned that about having to wait to have her pet scan performed, which is not scheduled until next wed. She is willing to go to another [place] location if necessary for her pet scan. Colette in cancer center scheduling will reach out to pet scan dept to see if another location or date would be available  Dr. Rogue Bussing made aware of pt's concerns re: anxiety. He will personally reach out to the patient.

## 2019-12-12 NOTE — Telephone Encounter (Signed)
Spoke to patient regarding my concerns for malignancy of gynecologic origin/based on elevated tumor markers.  Await PET scan.  Patient anxious wanting to see if the PET scan could be moved earlier that 23rd of next week; if possible in [place]. C-please check; and let me know.   Called in a script for xanax for anxiety/short term.

## 2019-12-12 NOTE — Progress Notes (Signed)
Hello-I wanted to inform you that- your tumor markers are elevated- which rises the concerns for ovarian type cancers. I will call you later in the afternoon to discuss further.   No new recommendations/follow-up as planned/call if any questions.  Thank you/Dr. B/MyChart message

## 2019-12-13 ENCOUNTER — Telehealth: Payer: Self-pay

## 2019-12-13 LAB — CYTOLOGY - NON PAP

## 2019-12-13 NOTE — Telephone Encounter (Signed)
Received referral from Dr. Rogue Bussing for [name] to be seen by Gyn Onc following PET scan. Noted BRCA 1 positive, malignant pleural effusion, ascites (cytology pending), CA 125 >8000. PET is scheduled for 12/23 at 1130. Left voicemail with Ms. Kana that [place] can see her following at 1530. Asked for callback if she cannot keep this appointment.

## 2019-12-14 LAB — CANCER ANTIGEN 19-9: CA 19-9: 150 U/mL — ABNORMAL HIGH (ref 0–35)

## 2019-12-16 ENCOUNTER — Other Ambulatory Visit: Payer: Self-pay | Admitting: Internal Medicine

## 2019-12-16 ENCOUNTER — Telehealth: Payer: Self-pay | Admitting: Internal Medicine

## 2019-12-16 ENCOUNTER — Other Ambulatory Visit: Payer: Self-pay | Admitting: Family Medicine

## 2019-12-16 ENCOUNTER — Telehealth: Payer: Self-pay | Admitting: *Deleted

## 2019-12-16 DIAGNOSIS — Z7189 Other specified counseling: Secondary | ICD-10-CM

## 2019-12-16 DIAGNOSIS — C482 Malignant neoplasm of peritoneum, unspecified: Secondary | ICD-10-CM | POA: Insufficient documentation

## 2019-12-16 DIAGNOSIS — B001 Herpesviral vesicular dermatitis: Secondary | ICD-10-CM

## 2019-12-16 DIAGNOSIS — R180 Malignant ascites: Secondary | ICD-10-CM

## 2019-12-16 HISTORY — DX: Other specified counseling: Z71.89

## 2019-12-16 NOTE — Progress Notes (Signed)
[employer]

## 2019-12-16 NOTE — Progress Notes (Signed)
START OFF PATHWAY REGIMEN - Other   [id number]:Carboplatin AUC=6 + Paclitaxel 175 mg/m2 + Bevacizumab 7.5 mg/kg q21 Days:   A cycle is every 21 days:     Paclitaxel      Carboplatin      Bevacizumab-xxxx   **Always confirm dose/schedule in your pharmacy ordering system**  Patient Characteristics: Intent of Therapy: Non-Curative / Palliative Intent, Discussed with Patient

## 2019-12-16 NOTE — Telephone Encounter (Signed)
On 12/19- I spoke to pt re: results of tumor markers; pathology suggestive of-adenocarcinoma gynecologic origin.  Awaiting PET scan.  Awaiting gynecology-oncology evaluation on 12/23.  Follow-up with me on 12/24-to proceed with chemotherapy-carbotaxol chemotherapy hopefully following week.

## 2019-12-16 NOTE — Telephone Encounter (Signed)
Spoke with Dr. Rogue Bussing - md approved u/s paracentesis. At this time, hold off on abd pleurx cath.

## 2019-12-16 NOTE — Telephone Encounter (Signed)
Pt made aware that u/s paracentesis at 9 am tomorrow. Pt gave verbal understanding of the plan of care.

## 2019-12-16 NOTE — Telephone Encounter (Signed)
Pt requesting a US paracentesis. She is also interested in the pleurx cath - if eligible.

## 2019-12-17 ENCOUNTER — Other Ambulatory Visit: Payer: Self-pay

## 2019-12-17 ENCOUNTER — Ambulatory Visit
Admission: RE | Admit: 2019-12-17 | Discharge: 2019-12-17 | Disposition: A | Discharge status: Home / Self Care | Payer: Medicaid Other | Source: Ambulatory Visit | Attending: Internal Medicine | Admitting: Internal Medicine

## 2019-12-17 ENCOUNTER — Telehealth: Payer: Self-pay

## 2019-12-17 ENCOUNTER — Ambulatory Visit: Admission: RE | Admit: 2019-12-17 | Payer: Self-pay | Source: Ambulatory Visit

## 2019-12-17 DIAGNOSIS — R180 Malignant ascites: Secondary | ICD-10-CM

## 2019-12-17 DIAGNOSIS — C801 Malignant (primary) neoplasm, unspecified: Secondary | ICD-10-CM | POA: Diagnosis not present

## 2019-12-17 NOTE — Telephone Encounter (Signed)
Called and left [name] a voicemail to come to the cancer center following her PET scan instead of at 1530. We have had a cancellation and should be able to see her sooner. This will also generate on my chart. Asked for a return call if she prefers not to come right over after scan.
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
## 2019-12-18 ENCOUNTER — Inpatient Hospital Stay (HOSPITAL_BASED_OUTPATIENT_CLINIC_OR_DEPARTMENT_OTHER): Payer: Medicaid Other | Admitting: Obstetrics and Gynecology

## 2019-12-18 ENCOUNTER — Encounter
Admission: RE | Admit: 2019-12-18 | Discharge: 2019-12-18 | Disposition: A | Discharge status: Home / Self Care | Payer: Medicaid Other | Source: Ambulatory Visit | Attending: Internal Medicine | Admitting: Internal Medicine

## 2019-12-18 ENCOUNTER — Inpatient Hospital Stay: Payer: Medicaid Other

## 2019-12-18 ENCOUNTER — Inpatient Hospital Stay: Payer: Medicaid Other | Attending: Internal Medicine | Admitting: Internal Medicine

## 2019-12-18 ENCOUNTER — Other Ambulatory Visit: Payer: Self-pay

## 2019-12-18 VITALS — BP 121/65 | HR 116 | Temp 97.4°F | Resp 16 | Ht 63.0 in | Wt 247.6 lb

## 2019-12-18 DIAGNOSIS — R180 Malignant ascites: Secondary | ICD-10-CM | POA: Diagnosis not present

## 2019-12-18 DIAGNOSIS — Z1501 Genetic susceptibility to malignant neoplasm of breast: Secondary | ICD-10-CM | POA: Diagnosis not present

## 2019-12-18 DIAGNOSIS — C3411 Malignant neoplasm of upper lobe, right bronchus or lung: Secondary | ICD-10-CM | POA: Diagnosis not present

## 2019-12-18 DIAGNOSIS — C482 Malignant neoplasm of peritoneum, unspecified: Secondary | ICD-10-CM | POA: Diagnosis not present

## 2019-12-18 DIAGNOSIS — C801 Malignant (primary) neoplasm, unspecified: Secondary | ICD-10-CM | POA: Insufficient documentation

## 2019-12-18 DIAGNOSIS — Z1509 Genetic susceptibility to other malignant neoplasm: Secondary | ICD-10-CM | POA: Diagnosis not present

## 2019-12-18 DIAGNOSIS — F419 Anxiety disorder, unspecified: Secondary | ICD-10-CM | POA: Insufficient documentation

## 2019-12-18 DIAGNOSIS — F17210 Nicotine dependence, cigarettes, uncomplicated: Secondary | ICD-10-CM | POA: Insufficient documentation

## 2019-12-18 LAB — GLUCOSE, CAPILLARY: Glucose-Capillary: 108 mg/dL — ABNORMAL HIGH (ref 70–99)

## 2019-12-18 MED ORDER — FLUDEOXYGLUCOSE F - 18 (FDG) INJECTION
13.3000 | Freq: Once | INTRAVENOUS | Status: AC | PRN
  Administered 2019-12-18: 13.46 via INTRAVENOUS

## 2019-12-18 MED ORDER — ONDANSETRON HCL 8 MG PO TABS: ORAL_TABLET | ORAL | 1 refills | Status: DC

## 2019-12-18 MED ORDER — PROCHLORPERAZINE MALEATE 10 MG PO TABS: 10.0000 mg | ORAL_TABLET | Freq: Four times a day (QID) | ORAL | 1 refills | Status: DC | PRN

## 2019-12-18 NOTE — Progress Notes (Signed)
Pt got nauseated after pet scan, she ate and now she feels better. She feels pressure  But not pain
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
## 2019-12-18 NOTE — Assessment & Plan Note (Addendum)
#  Adenocarcinoma-cytology/thoracentesis nonspecific primary based on immunohistochemistry; given the lung mass-suspicious for lung cancer.  Recommend PET scan; tumor markers for further evaluation.  #Long discussion with the patient and daughter regarding-advanced age of malignancy/stage IV.  In general treatments are palliative not curative. Based on current clinical consideration of lung primary-chemoimmunotherapy would be recommended.  However-NGS-would be needed; ? on cytology.  We discussed with pathology.  Patient also need brain MRI; will discuss at subsequent visits.  #Abdominal distention-ascites on exam; check ultrasound of the abdomen stat; possible paracentesis.  #Right-sided pleural effusion-malignant; check chest x-ray today.  Discussed regarding Pleurx catheter/pleurodesis.  #BRCA1-no obvious evidence of breast malignancy/ovarian malignancy.  Discussed that lung cancer typically not associated with BRCA type malignancies.   #Patient emotionally distressed obviously-offered support.  Make referral to social work given lack of insurance.  # DISPOSITION: will call with PET results # chemo ed asap/chemo care # 12/31- MD; cbc/cmp;UA- carbo-Taxol-Avastin-Dr.B

## 2019-12-19 ENCOUNTER — Telehealth: Payer: Self-pay | Admitting: Internal Medicine

## 2019-12-19 ENCOUNTER — Telehealth: Payer: Self-pay

## 2019-12-19 ENCOUNTER — Inpatient Hospital Stay: Payer: Medicaid Other | Admitting: Internal Medicine

## 2019-12-19 NOTE — Telephone Encounter (Signed)
Spoke with [name] following her consult with PSN. Provided all upcoming appointments. Instructed to come at 0900 and not 0830 for her chemo class. She also has these provided in her my chart. All questions answered.

## 2019-12-19 NOTE — Progress Notes (Signed)
PSN spoke with patient via phone this am.  PSN gave patient information about how to apply for Social Security Disability, Medicaid, and financial assistance through [employer].  PSN will also provide assistance for patient with her monthly expenses through the [facility].

## 2019-12-19 NOTE — Assessment & Plan Note (Addendum)
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 Discussed with pathology.   I reviewed the stage/pathology with patient and family. Also reviewed the images/labs in detail.  #Recommend neoadjuvant chemotherapy carbotaxol plus Avastin-3-4 cycles.  Typically followed by surgery followed by completion of adjuvant therapy.  Given BRCA positive disease-patient would benefit from Parp inhibitor maintenance therapy.  # Discussed the potential side effects including but not limited to-increasing fatigue, nausea vomiting, diarrhea, hair loss, sores in the mouth, increase risk of infection and also neuropathy.   # Reviewed the rationale for using Avastin. Discussed the potential side effects including but not limited to elevated blood pressure ; nephrotic syndrome wound healing problems.   #Large volume ascites-status post recent paracentesis-symptomatically improved.  Discussed that I would not recommend catheter placement as she should have good response from therapy.   #Patient emotionally distressed obviously-offered support.  Make referral to social work given lack of insurance.  Start the patient on Xanax for anxiety-stable.  #Discussed with Dr. Theora Gianotti.  Discussed with Christy stanton.  Prescribed antiemetics/discussed regarding port; hold for now.  Discussed with radiology-no obvious involvement of the large bowel/rectum by malignancy.  However omental caking/proximity small bowel loops noted.   # DISPOSITION: will call with PET results # chemo ed asap/chemo care # 12/31- MD; cbc/cmp;UA- carbo-Taxol-Avastin-Dr.B

## 2019-12-19 NOTE — Telephone Encounter (Signed)
On 12/24-left a message for the patient to discuss the results of the PET scan.  We will send my chart message.  ----------------------------------------  Hi [name]-I tried calling you earlier.  PET scan-shows no huge surprises.  Confirms a suspicion of cancer of gynecologic origin [fallopian tube/ovarian].  Some fluid seen around the lung on the right/and also in the abdomen-however not large enough it need to be drained at this time.  As we discussed-should respond well to chemotherapy.  Will start on chemotherapy next week as planned.  -----------------------------------

## 2019-12-21 ENCOUNTER — Other Ambulatory Visit: Payer: Self-pay

## 2019-12-21 ENCOUNTER — Encounter: Payer: Self-pay | Admitting: Emergency Medicine

## 2019-12-21 ENCOUNTER — Encounter: Payer: Self-pay | Admitting: Internal Medicine

## 2019-12-21 ENCOUNTER — Telehealth: Payer: Self-pay | Admitting: Hematology and Oncology

## 2019-12-21 ENCOUNTER — Emergency Department: Payer: Medicaid Other

## 2019-12-21 DIAGNOSIS — R0602 Shortness of breath: Secondary | ICD-10-CM | POA: Diagnosis not present

## 2019-12-21 DIAGNOSIS — J90 Pleural effusion, not elsewhere classified: Secondary | ICD-10-CM | POA: Diagnosis not present

## 2019-12-21 DIAGNOSIS — R0600 Dyspnea, unspecified: Secondary | ICD-10-CM | POA: Diagnosis present

## 2019-12-21 DIAGNOSIS — R109 Unspecified abdominal pain: Secondary | ICD-10-CM | POA: Insufficient documentation

## 2019-12-21 DIAGNOSIS — Z803 Family history of malignant neoplasm of breast: Secondary | ICD-10-CM | POA: Diagnosis not present

## 2019-12-21 DIAGNOSIS — F17210 Nicotine dependence, cigarettes, uncomplicated: Secondary | ICD-10-CM | POA: Insufficient documentation

## 2019-12-21 DIAGNOSIS — R140 Abdominal distension (gaseous): Secondary | ICD-10-CM | POA: Insufficient documentation

## 2019-12-21 DIAGNOSIS — Z79899 Other long term (current) drug therapy: Secondary | ICD-10-CM | POA: Diagnosis not present

## 2019-12-21 DIAGNOSIS — Z1501 Genetic susceptibility to malignant neoplasm of breast: Secondary | ICD-10-CM | POA: Diagnosis not present

## 2019-12-21 DIAGNOSIS — C3411 Malignant neoplasm of upper lobe, right bronchus or lung: Secondary | ICD-10-CM | POA: Insufficient documentation

## 2019-12-21 DIAGNOSIS — C786 Secondary malignant neoplasm of retroperitoneum and peritoneum: Secondary | ICD-10-CM | POA: Insufficient documentation

## 2019-12-21 DIAGNOSIS — Z800 Family history of malignant neoplasm of digestive organs: Secondary | ICD-10-CM | POA: Insufficient documentation

## 2019-12-21 DIAGNOSIS — J910 Malignant pleural effusion: Principal | ICD-10-CM | POA: Insufficient documentation

## 2019-12-21 DIAGNOSIS — R509 Fever, unspecified: Secondary | ICD-10-CM | POA: Insufficient documentation

## 2019-12-21 DIAGNOSIS — Z801 Family history of malignant neoplasm of trachea, bronchus and lung: Secondary | ICD-10-CM | POA: Diagnosis not present

## 2019-12-21 DIAGNOSIS — Z20828 Contact with and (suspected) exposure to other viral communicable diseases: Secondary | ICD-10-CM | POA: Diagnosis not present

## 2019-12-21 DIAGNOSIS — F329 Major depressive disorder, single episode, unspecified: Secondary | ICD-10-CM | POA: Diagnosis not present

## 2019-12-21 DIAGNOSIS — R180 Malignant ascites: Secondary | ICD-10-CM | POA: Insufficient documentation

## 2019-12-21 DIAGNOSIS — R000 Tachycardia, unspecified: Secondary | ICD-10-CM | POA: Insufficient documentation

## 2019-12-21 LAB — COMPREHENSIVE METABOLIC PANEL
ALT: 9 U/L (ref 0–44)
AST: 21 U/L (ref 15–41)
Albumin: 2.2 g/dL — ABNORMAL LOW (ref 3.5–5.0)
Alkaline Phosphatase: 74 U/L (ref 38–126)
Anion gap: 13 (ref 5–15)
BUN: 9 mg/dL (ref 6–20)
CO2: 22 mmol/L (ref 22–32)
Calcium: 8.2 mg/dL — ABNORMAL LOW (ref 8.9–10.3)
Chloride: 102 mmol/L (ref 98–111)
Creatinine, Ser: 0.74 mg/dL (ref 0.44–1.00)
GFR calc Af Amer: >60 mL/min (ref >60)
GFR calc non Af Amer: >60 mL/min (ref >60)
Glucose, Bld: 176 mg/dL — ABNORMAL HIGH (ref 70–99)
Potassium: 3.7 mmol/L (ref 3.5–5.1)
Sodium: 137 mmol/L (ref 135–145)
Total Bilirubin: 0.4 mg/dL (ref 0.3–1.2)
Total Protein: 6.2 g/dL — ABNORMAL LOW (ref 6.5–8.1)

## 2019-12-21 LAB — CBC
HCT: 43.6 % (ref 36.0–46.0)
Hemoglobin: 14.9 g/dL (ref 12.0–15.0)
MCH: 29.9 pg (ref 26.0–34.0)
MCHC: 34.2 g/dL (ref 30.0–36.0)
MCV: 87.4 fL (ref 80.0–100.0)
Platelets: 619 10*3/uL — ABNORMAL HIGH (ref 150–400)
RBC: 4.99 MIL/uL (ref 3.87–5.11)
RDW: 12.6 % (ref 11.5–15.5)
WBC: 11.9 10*3/uL — ABNORMAL HIGH (ref 4.0–10.5)
nRBC: 0 % (ref 0.0–0.2)

## 2019-12-21 LAB — LIPASE, BLOOD: Lipase: 28 U/L (ref 11–51)

## 2019-12-21 MED ORDER — SODIUM CHLORIDE 0.9% FLUSH
3.0000 mL | Freq: Once | INTRAVENOUS | Status: AC
  Administered 2019-12-22: 05:00:00 3 mL via INTRAVENOUS

## 2019-12-21 NOTE — ED Triage Notes (Signed)
States was diagnosed with stage 4 cancer on 12/15. States it began "in my female organs". States she has had thoracentesis x 1 and paracentesis x2 for fluid build up since diagnosis. States that she feels like fluid is built up again making her feel short of breath. Scheduled to start chemo 5 days from now.

## 2019-12-21 NOTE — Telephone Encounter (Signed)
Re:  Increasing abdominal girth  Patient called regarding increasing abdominal girth and difficulty breathing.  She is uncomfortable sitting down and lying down.  She has undergone paracentesis x 2 (5.1 liters on 12/17/2019 and 2.4 liters on 12/11/2019).  PET scan 12/18/2019 also revealed a moderate right sided pleural effusion.  She is scheduled for her 1st chemotherapy next week for primary peritoneal carcinomatosis.  Patient directed to the ER for likely paracentesis.   Lequita Asal, MD

## 2019-12-22 ENCOUNTER — Other Ambulatory Visit: Payer: Self-pay

## 2019-12-22 ENCOUNTER — Encounter: Payer: Self-pay | Admitting: Internal Medicine

## 2019-12-22 ENCOUNTER — Observation Stay
Admission: EM | Admit: 2019-12-22 | Discharge: 2019-12-23 | Disposition: A | Discharge status: Home / Self Care | Payer: Medicaid Other | Attending: Internal Medicine | Admitting: Internal Medicine

## 2019-12-22 DIAGNOSIS — Z7189 Other specified counseling: Secondary | ICD-10-CM

## 2019-12-22 DIAGNOSIS — R180 Malignant ascites: Secondary | ICD-10-CM | POA: Diagnosis not present

## 2019-12-22 DIAGNOSIS — C482 Malignant neoplasm of peritoneum, unspecified: Secondary | ICD-10-CM

## 2019-12-22 DIAGNOSIS — R0600 Dyspnea, unspecified: Secondary | ICD-10-CM | POA: Diagnosis not present

## 2019-12-22 DIAGNOSIS — R05 Cough: Secondary | ICD-10-CM

## 2019-12-22 DIAGNOSIS — C579 Malignant neoplasm of female genital organ, unspecified: Secondary | ICD-10-CM

## 2019-12-22 DIAGNOSIS — R059 Cough, unspecified: Secondary | ICD-10-CM

## 2019-12-22 DIAGNOSIS — R188 Other ascites: Secondary | ICD-10-CM

## 2019-12-22 DIAGNOSIS — G43109 Migraine with aura, not intractable, without status migrainosus: Secondary | ICD-10-CM

## 2019-12-22 DIAGNOSIS — R000 Tachycardia, unspecified: Secondary | ICD-10-CM

## 2019-12-22 DIAGNOSIS — Z9889 Other specified postprocedural states: Secondary | ICD-10-CM

## 2019-12-22 DIAGNOSIS — J90 Pleural effusion, not elsewhere classified: Secondary | ICD-10-CM

## 2019-12-22 DIAGNOSIS — Z1501 Genetic susceptibility to malignant neoplasm of breast: Secondary | ICD-10-CM | POA: Diagnosis not present

## 2019-12-22 LAB — RESPIRATORY PANEL BY RT PCR (FLU A&B, COVID)
Influenza A by PCR: NEGATIVE
Influenza B by PCR: NEGATIVE
SARS Coronavirus 2 by RT PCR: NEGATIVE

## 2019-12-22 LAB — PROTIME-INR
INR: 1.1 (ref 0.8–1.2)
Prothrombin Time: 13.8 seconds (ref 11.4–15.2)

## 2019-12-22 MED ORDER — ALPRAZOLAM 0.25 MG PO TABS: 0.2500 mg | ORAL_TABLET | Freq: Two times a day (BID) | ORAL | Status: DC | PRN

## 2019-12-22 MED ORDER — PROCHLORPERAZINE MALEATE 10 MG PO TABS
10.0000 mg | ORAL_TABLET | Freq: Four times a day (QID) | ORAL | Status: DC | PRN
  Administered 2019-12-22 – 2019-12-23 (×2): 10 mg via ORAL
  Filled 2019-12-22 (×5): qty 1

## 2019-12-22 MED ORDER — ADULT MULTIVITAMIN W/MINERALS CH
1.0000 | ORAL_TABLET | Freq: Every day | ORAL | Status: DC
  Administered 2019-12-22 – 2019-12-23 (×2): 1 via ORAL
  Filled 2019-12-22 (×2): qty 1

## 2019-12-22 MED ORDER — TOPIRAMATE 25 MG PO TABS
50.0000 mg | ORAL_TABLET | Freq: Two times a day (BID) | ORAL | Status: DC
  Administered 2019-12-22 – 2019-12-23 (×2): 50 mg via ORAL
  Filled 2019-12-22 (×3): qty 2

## 2019-12-22 MED ORDER — ONDANSETRON HCL 4 MG PO TABS: 4.0000 mg | ORAL_TABLET | Freq: Three times a day (TID) | ORAL | Status: DC | PRN

## 2019-12-22 MED ORDER — DULOXETINE HCL 30 MG PO CPEP
60.0000 mg | ORAL_CAPSULE | Freq: Every day | ORAL | Status: DC
  Administered 2019-12-22 – 2019-12-23 (×2): 60 mg via ORAL
  Filled 2019-12-22 (×2): qty 2

## 2019-12-22 MED ORDER — METOPROLOL TARTRATE 25 MG PO TABS
12.5000 mg | ORAL_TABLET | Freq: Two times a day (BID) | ORAL | Status: DC
  Administered 2019-12-22 – 2019-12-23 (×4): 12.5 mg via ORAL
  Filled 2019-12-22 (×4): qty 1

## 2019-12-22 MED ORDER — LORATADINE 10 MG PO TABS
10.0000 mg | ORAL_TABLET | Freq: Every day | ORAL | Status: DC
  Administered 2019-12-23: 10 mg via ORAL
  Filled 2019-12-22: qty 1

## 2019-12-22 MED ORDER — SPIRONOLACTONE 25 MG PO TABS
25.0000 mg | ORAL_TABLET | Freq: Every day | ORAL | Status: DC
  Administered 2019-12-22 – 2019-12-23 (×2): 25 mg via ORAL
  Filled 2019-12-22 (×2): qty 1

## 2019-12-22 MED ORDER — PANTOPRAZOLE SODIUM 40 MG PO TBEC
40.0000 mg | DELAYED_RELEASE_TABLET | Freq: Every day | ORAL | Status: DC
  Administered 2019-12-23: 09:00:00 40 mg via ORAL
  Filled 2019-12-22: qty 1

## 2019-12-22 NOTE — H&P (Signed)
History and Physical    [name] KPV:[id number] DOB: 10-09-1971 DOA: 12/22/2019  PCP: Steele Sizer, MD  Patient coming from: home  I have personally briefly reviewed patient's old medical records in [place]  Chief Complaint: shortness of breath, abdominal distention  HPI: [name] is a 48 y.o. female with recently diagnosed primary peritoneal carcinomatosis, followed by Dr. Rogue Bussing with plans for start chemotherapy in 1 week was sent in by Dr. Rogue Bussing complaint of shortness of breath and continuing abdominal distention abdominal pain..  Past week patient underwent thoracentesis x1 and paracentesis x2.  Spoke with her oncologist yesterday who recommended that she come into the ER for repeat paracentesis.  Patient denies chest pain short of breath with exertion.  PET scan done on 1223 showed moderate right-sided pleural effusion  ED Course: On arrival in the emergency room had a low-grade temperature of 99.1 pressure 119/89 and she was tachycardic with a heart rate of 139, improving to 117 with rest.  O2 sat was 97% on room air.  Review of Systems: As per HPI otherwise 10 point review of systems negative.    Past Medical History:  Diagnosis Date   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   Pathogenic BRCA1 mutation at [facility] of bronchus of right upper lobe ([place]) 12/11/2019   Depression    Family history of breast cancer    GERD (gastroesophageal reflux disease)    Menorrhagia    Migraines    Osteoarthritis    back   Plantar fasciitis     Past Surgical History:  Procedure Laterality Date   APPENDECTOMY     LSC but "ruptured when they did the surgery"   CESAREAN SECTION     IR THORACENTESIS ASP PLEURAL SPACE W/IMG GUIDE  12/06/2019   PARACENTESIS     TUBAL LIGATION     at time of CSxn   WRIST SURGERY Left 11/21/2016   plates and screws inserted     reports that she has been smoking cigarettes. She has a 10.00 pack-year smoking history. She has  never used smokeless tobacco. She reports current alcohol use. She reports that she does not use drugs.  No Known Allergies  Family History  Adopted: Yes  Problem Relation Age of Onset   Lung cancer Father        deceased 48   Breast cancer Mother 40       currently 60   Colon cancer Mother    ADD / ADHD Son    ADD / ADHD Son    Early death Maternal Aunt    Breast cancer Maternal Aunt 11       deceased 32   Breast cancer Maternal Grandmother    Depression Daughter    Depression Daughter    Prostate cancer Paternal Uncle    Stroke Paternal Uncle    Leukemia Paternal Aunt    Breast cancer Paternal Grandmother      Prior to Admission medications   Medication Sig Start Date End Date Taking? Authorizing Provider  ALPRAZolam (XANAX) 0.25 MG tablet Take 1 tablet (0.25 mg total) by mouth 2 (two) times daily as needed for anxiety. Please do not drive while on the medication- as this can cause dizziness. 12/12/19  Yes Cammie Sickle, MD  atenolol (TENORMIN) 25 MG tablet Take 1 tablet (25 mg total) by mouth daily. 10/25/19  Yes Sowles, Drue Stager, MD  B Complex-C (B-COMPLEX WITH VITAMIN C) tablet Take 1 tablet by mouth daily.  Yes [provider]  Biotin w/ Vitamins C & E (HAIR/SKIN/NAILS PO) Take 1 tablet by mouth daily.   Yes [provider]  DULoxetine (CYMBALTA) 60 MG capsule Take 1 capsule (60 mg total) by mouth daily. 10/25/19  Yes Sowles, Drue Stager, MD  fexofenadine [facility] ALLER[facility]) 180 MG tablet Take 1 tablet (180 mg total) by mouth daily as needed for allergies or rhinitis. 05/14/18  Yes Lada, Satira Anis, MD  levonorgestrel (MIRENA) 20 MCG/24HR IUD 1 each by Intrauterine route once.   Yes [provider]  Multiple Vitamin (MULTIVITAMIN WITH MINERALS) TABS tablet Take 1 tablet by mouth daily.   Yes [provider]  omeprazole (PRILOSEC) 20 MG capsule Take 1 capsule (20 mg total) by mouth daily. 10/25/19  Yes Sowles, Drue Stager, MD  ondansetron  (ZOFRAN) 8 MG tablet One pill every 8 hours as needed for nausea/vomitting. 12/18/19  Yes Cammie Sickle, MD  prochlorperazine (COMPAZINE) 10 MG tablet Take 1 tablet (10 mg total) by mouth every 6 (six) hours as needed for nausea or vomiting. 12/18/19  Yes Cammie Sickle, MD  topiramate (TOPAMAX) 50 MG tablet Take 1 tablet (50 mg total) by mouth 2 (two) times daily. Start with one daily and go up to two daily Patient taking differently: Take 50 mg by mouth daily.  10/27/19  Yes Sowles, Drue Stager, MD  valACYclovir (VALTREX) 1000 MG tablet Take 1 tablet (1,000 mg total) by mouth 2 (two) times daily as needed (outbreak). 12/16/19  Yes Sowles, Drue Stager, MD  hydrOXYzine (ATARAX/VISTARIL) 10 MG tablet Take 1-2 tablets (10-20 mg total) by mouth every 6 (six) hours. Patient not taking: Reported on 12/22/2019 12/10/19   Steele Sizer, MD  ranitidine (ZANTAC) 300 MG tablet Take 1 tablet (300 mg total) by mouth daily as needed for heartburn. 04/18/18 10/24/19  Steele Sizer, MD    Physical Exam: Vitals:   12/21/19 2114 12/21/19 2115 12/22/19 0137 12/22/19 0330  BP: 123/80  119/89 123/72  Pulse: (!) 139  (!) 117 (!) 104  Resp: (!) 24  20   Temp: 99.1 F (37.3 C)     TempSrc: Oral     SpO2: 97%  97% 100%  Weight:  112 kg    Height:  _0  (1.6 m)       Vitals:   12/21/19 2114 12/21/19 2115 12/22/19 0137 12/22/19 0330  BP: 123/80  119/89 123/72  Pulse: (!) 139  (!) 117 (!) 104  Resp: (!) 24  20   Temp: 99.1 F (37.3 C)     TempSrc: Oral     SpO2: 97%  97% 100%  Weight:  112 kg    Height:  _1  (1.6 m)      Constitutional: appears comfortable Eyes: PERRL, lids and conjunctivae normal ENMT: Mucous membranes are moist.  Neck: normal, supple, no masses, no thyromegaly Respiratory: mild tachypnea with exertion. Decreased air entry right base. No wheezing or crackles  Cardiovascular: tachycardia, no murmurs  Abdomen: Distended, non tender Musculoskeletal: no clubbing /  cyanosis. No joint deformity upper and lower extremities. Skin: no rashes, lesions, ulcers. No induration Neurologic: No gross focal neurologic deficits  psychiatric: Normal judgment and insight. Normal mood.    Labs on Admission: I have personally reviewed following labs and imaging studies  CBC: Recent Labs  Lab 12/21/19 2126  WBC 11.9*  HGB 14.9  HCT 43.6  MCV 87.4  PLT 676*   Basic Metabolic Panel: Recent Labs  Lab 12/21/19 2126  NA 137  K  3.7  CL 102  CO2 22  GLUCOSE 176*  BUN 9  CREATININE 0.74  CALCIUM 8.2*   GFR: Estimated Creatinine Clearance: 104.6 mL/min (by C-G formula based on SCr of 0.74 mg/dL). Liver Function Tests: Recent Labs  Lab 12/21/19 2126  AST 21  ALT 9  ALKPHOS 74  BILITOT 0.4  PROT 6.2*  ALBUMIN 2.2*   Recent Labs  Lab 12/21/19 2126  LIPASE 28   No results for input(s): AMMONIA in the last 168 hours. Coagulation Profile: No results for input(s): INR, PROTIME in the last 168 hours. Cardiac Enzymes: No results for input(s): CKTOTAL, CKMB, CKMBINDEX, TROPONINI in the last 168 hours. BNP (last 3 results) No results for input(s): PROBNP in the last 8760 hours. HbA1C: No results for input(s): HGBA1C in the last 72 hours. CBG: Recent Labs  Lab 12/18/19 1113  GLUCAP 108*   Lipid Profile: No results for input(s): CHOL, HDL, LDLCALC, TRIG, CHOLHDL, LDLDIRECT in the last 72 hours. Thyroid Function Tests: No results for input(s): TSH, T4TOTAL, FREET4, T3FREE, THYROIDAB in the last 72 hours. Anemia Panel: No results for input(s): VITAMINB12, FOLATE, FERRITIN, TIBC, IRON, RETICCTPCT in the last 72 hours. Urine analysis:    Component Value Date/Time   COLORURINE AMBER (A) 12/05/2019 1647   APPEARANCEUR CLOUDY (A) 12/05/2019 1647   LABSPEC >1.046 (H) 12/05/2019 1647   PHURINE 5.0 12/05/2019 1647   GLUCOSEU NEGATIVE 12/05/2019 1647   HGBUR NEGATIVE 12/05/2019 1647   BILIRUBINUR NEGATIVE 12/05/2019 1647   KETONESUR NEGATIVE  12/05/2019 1647   PROTEINUR NEGATIVE 12/05/2019 1647   UROBILINOGEN 0.2 08/04/2010 1913   NITRITE NEGATIVE 12/05/2019 1647   LEUKOCYTESUR NEGATIVE 12/05/2019 1647    Radiological Exams on Admission: DG Chest 2 View  Result Date: 12/21/2019 CLINICAL DATA:  48 year old female with shortness of breath. EXAM: CHEST - 2 VIEW COMPARISON:  Chest radiograph dated 12/06/2019 and PET-CT dated 12/18/2019. FINDINGS: There is consolidative changes of the right lower lobe, increased since the prior radiograph. There is a small to moderate right pleural effusion. The left lung is clear. No pneumothorax. The cardiac silhouette is within normal limits. No acute osseous pathology. IMPRESSION: Small to moderate right pleural effusion with consolidative changes of the right lower lobe. This findings are similar to the PET-CT of 12/18/2019. Electronically Signed   By: Anner Crete M.D.   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 ([place]) --Followed by oncology for first chemotherapy next week --In the past week patient has had thoracentesis x1 and paracentesis x2 --Oncology consult --IR consult for paracentesis     Recurrent right pleural effusion --PET scan on 1223 showed moderate right-sided pleural effusion --Chest x-ray showing small to moderate right pleural effusion    Tachycardia --Patient with heart rate up to the 130 --Continuous cardiac monitoring      DVT prophylaxis: TED hose for possible procedure Code Status: full  Disposition Plan: Back to previous home environment Consults called: IR, oncology Admission status: obs   Athena Masse MD [facility]   If 7PM-7AM, please contact night-coverage [email or web address] Password [email or web address]  12/22/2019, 3:50 AM

## 2019-12-22 NOTE — ED Notes (Signed)
Pt reassessed and placed in subwait. Pt denies worsening of symptoms.

## 2019-12-22 NOTE — ED Provider Notes (Signed)
[facility] Emergency Department Provider Note  ____________________________________________   First MD Initiated Contact with Patient 12/22/19 0231     (approximate)  I have reviewed the triage vital signs and the nursing notes.   HISTORY  Chief Complaint Shortness of Breath and Abdominal Pain    HPI [name] is a 48 y.o. female  with metastatic GYN cancer (diagnosed about 2 weeks ago)   under the care of Dr. Rogue Bussing and who will start chemotherapy within about a week.  The patient presents tonight for worsening shortness of breath over the last couple of days.  She has had 2 paracenteses and 1 thoracentesis since her diagnosis.  Her last paracentesis was by interventional radiology about 5 days ago.  She said that she has a distended abdomen is much as she did the last time and her symptoms are severe.  Her shortness of breath gets much worse when she walks around and better at rest.  Her symptoms are not particularly worse when lying down.  No recent fever or chills, no chest pain, no sore throat.  No COVID-19 contacts of which she is aware.  She reports that the symptoms are severe and she does not think she can wait until Monday or Tuesday.  She called Dr. Mike Gip who is on-call for oncology who recommended she come to the emergency department.        Past Medical History:  Diagnosis Date  . 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   Pathogenic BRCA1 mutation at [place]  . Cancer of bronchus of right upper lobe ([place]) 12/11/2019  . Depression   . Family history of breast cancer   . GERD (gastroesophageal reflux disease)   . Menorrhagia   . Migraines   . Osteoarthritis    back  . Plantar fasciitis     Patient Active Problem List   Diagnosis Date Noted  . Malignant ascites 12/22/2019  . Acute dyspnea 12/22/2019  . Recurrent right pleural effusion 12/22/2019  . Tachycardia 12/22/2019  . Primary peritoneal carcinomatosis ([place]) 12/16/2019  . Goals of  care, counseling/discussion 12/16/2019  . Carcinoma of unknown primary ([place]) 12/11/2019  . Erythrocytosis 11/12/2019  . Morbid obesity with BMI of 40.0-44.9, adult ([place]) 07/07/2018  . 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 Fever blister 05/17/2018  . Family history of breast cancer 05/08/2018  . Family history of colon cancer in mother 05/08/2018  . Migraine with aura and without status migrainosus 04/18/2018  . Dysmenorrhea 06/21/2017  . Menorrhagia 06/21/2017  . Mild recurrent major depression ([place]) 01/20/2016  . Esophageal reflux 01/20/2016  . Osteoarthritis, multiple sites 01/20/2016  . Frequent headaches 01/20/2016    Past Surgical History:  Procedure Laterality Date  . APPENDECTOMY     LSC but "ruptured when they did the surgery"  . CESAREAN SECTION    . IR THORACENTESIS ASP PLEURAL SPACE W/IMG GUIDE  12/06/2019  . PARACENTESIS    . TUBAL LIGATION     at time of CSxn  . WRIST SURGERY Left 11/21/2016   plates and screws inserted    Prior to Admission medications   Medication Sig Start Date End Date Taking? Authorizing Provider  ALPRAZolam (XANAX) 0.25 MG tablet Take 1 tablet (0.25 mg total) by mouth 2 (two) times daily as needed for anxiety. Please do not drive while on the medication- as this can cause dizziness. 12/12/19  Yes Cammie Sickle, MD  atenolol (TENORMIN) 25 MG tablet Take 1 tablet (25 mg total) by mouth daily. 10/25/19  Yes Steele Sizer, MD  B Complex-C (B-COMPLEX WITH VITAMIN C) tablet Take 1 tablet by mouth daily.   Yes [provider]  Biotin w/ Vitamins C & E (HAIR/SKIN/NAILS PO) Take 1 tablet by mouth daily.   Yes [provider]  DULoxetine (CYMBALTA) 60 MG capsule Take 1 capsule (60 mg total) by mouth daily. 10/25/19  Yes Sowles, Drue Stager, MD  fexofenadine [facility] ALLERGY) 180 MG tablet Take 1 tablet (180 mg total) by mouth daily as needed for allergies or rhinitis. 05/14/18  Yes Lada, Satira Anis, MD  levonorgestrel (MIRENA) 20 MCG/24HR  IUD 1 each by Intrauterine route once.   Yes [provider]  Multiple Vitamin (MULTIVITAMIN WITH MINERALS) TABS tablet Take 1 tablet by mouth daily.   Yes [provider]  omeprazole (PRILOSEC) 20 MG capsule Take 1 capsule (20 mg total) by mouth daily. 10/25/19  Yes Sowles, Drue Stager, MD  ondansetron (ZOFRAN) 8 MG tablet One pill every 8 hours as needed for nausea/vomitting. 12/18/19  Yes Cammie Sickle, MD  prochlorperazine (COMPAZINE) 10 MG tablet Take 1 tablet (10 mg total) by mouth every 6 (six) hours as needed for nausea or vomiting. 12/18/19  Yes Cammie Sickle, MD  topiramate (TOPAMAX) 50 MG tablet Take 1 tablet (50 mg total) by mouth 2 (two) times daily. Start with one daily and go up to two daily Patient taking differently: Take 50 mg by mouth daily.  10/27/19  Yes Sowles, Drue Stager, MD  valACYclovir (VALTREX) 1000 MG tablet Take 1 tablet (1,000 mg total) by mouth 2 (two) times daily as needed (outbreak). 12/16/19  Yes Sowles, Drue Stager, MD  hydrOXYzine (ATARAX/VISTARIL) 10 MG tablet Take 1-2 tablets (10-20 mg total) by mouth every 6 (six) hours. Patient not taking: Reported on 12/22/2019 12/10/19   Steele Sizer, MD  ranitidine (ZANTAC) 300 MG tablet Take 1 tablet (300 mg total) by mouth daily as needed for heartburn. 04/18/18 10/24/19  Steele Sizer, MD    Allergies Patient has no known allergies.  Family History  Adopted: Yes  Problem Relation Age of Onset  . Lung cancer Father        deceased 32  . Breast cancer Mother 6       currently 58  . Colon cancer Mother   . ADD / ADHD Son   . ADD / ADHD Son   . Early death Maternal Aunt   . Breast cancer Maternal Aunt 34       deceased 17  . Breast cancer Maternal Grandmother   . Depression Daughter   . Depression Daughter   . Prostate cancer Paternal Uncle   . Stroke Paternal Uncle   . Leukemia Paternal Aunt   . Breast cancer Paternal Grandmother     Social History Social History   Tobacco  Use  . Smoking status: Current Every Day Smoker    Packs/day: 0.50    Years: 20.00    Pack years: 10.00    Types: Cigarettes    Last attempt to quit: 12/2002    Years since quitting: 17.0  . Smokeless tobacco: Never Used  Substance Use Topics  . Alcohol use: Yes    Alcohol/week: 0.0 standard drinks    Comment: Socially  . Drug use: No    Review of Systems Constitutional: No fever/chills Eyes: No visual changes. ENT: No sore throat. Cardiovascular: Denies chest pain. Respiratory: Shortness of breath particularly with exertion similar to prior before paracentesis. Gastrointestinal: Abdominal distention.  Some ongoing mild lower abdominal pain.  No nausea, no vomiting.  No diarrhea.  No constipation. Genitourinary: Negative for dysuria. Musculoskeletal: Negative for neck pain.  Negative for back pain. Integumentary: Negative for rash. Neurological: Negative for headaches, focal weakness or numbness.   ____________________________________________   PHYSICAL EXAM:  VITAL SIGNS: ED Triage Vitals  Enc Vitals Group     BP 12/21/19 2114 123/80     Pulse Rate 12/21/19 2114 (!) 139     Resp 12/21/19 2114 (!) 24     Temp 12/21/19 2114 99.1 F (37.3 C)     Temp Source 12/21/19 2114 Oral     SpO2 12/21/19 2114 97 %     Weight 12/21/19 2115 112 kg (247 lb)     Height 12/21/19 2115 1.6 m (_0 )     Head Circumference --      Peak Flow --      Pain Score 12/21/19 2114 7     Pain Loc --      Pain Edu? --      Excl. in [place]? --     Constitutional: Alert and oriented.  No acute distress at rest but becomes short of breath with exertion. Eyes: Conjunctivae are normal.  Head: Atraumatic. Nose: No congestion/rhinnorhea. Mouth/Throat: Patient is wearing a mask. Neck: No stridor.  No meningeal signs.   Cardiovascular: Normal rate, regular rhythm. Good peripheral circulation. Grossly normal heart sounds. Respiratory: Normal respiratory effort.  No retractions. Gastrointestinal:  Obese with abdominal distention consistent with ascites.  No tenderness to palpation of the abdomen. Musculoskeletal: No lower extremity tenderness nor edema. No gross deformities of extremities. Neurologic:  Normal speech and language. No gross focal neurologic deficits are appreciated.  Skin:  Skin is warm, dry and intact. Psychiatric: Mood and affect are normal. Speech and behavior are normal.  ____________________________________________   LABS (all labs ordered are listed, but only abnormal results are displayed)  Labs Reviewed  COMPREHENSIVE METABOLIC PANEL - Abnormal; Notable for the following components:      Result Value   Glucose, Bld 176 (*)    Calcium 8.2 (*)    Total Protein 6.2 (*)    Albumin 2.2 (*)    All other components within normal limits  CBC - Abnormal; Notable for the following components:   WBC 11.9 (*)    Platelets 619 (*)    All other components within normal limits  RESPIRATORY PANEL BY RT PCR (FLU A&B, COVID)  LIPASE, BLOOD  PROTIME-INR   ____________________________________________  EKG  None - EKG not ordered by ED physician ____________________________________________  RADIOLOGY Ursula Alert, personally viewed and evaluated these images (plain radiographs) as part of my medical decision making, as well as reviewing the written report by the radiologist.  ED MD interpretation: Small to moderate right pleural effusion  Official radiology report(s): DG Chest 2 View  Result Date: 12/21/2019 CLINICAL DATA:  48 year old female with shortness of breath. EXAM: CHEST - 2 VIEW COMPARISON:  Chest radiograph dated 12/06/2019 and PET-CT dated 12/18/2019. FINDINGS: There is consolidative changes of the right lower lobe, increased since the prior radiograph. There is a small to moderate right pleural effusion. The left lung is clear. No pneumothorax. The cardiac silhouette is within normal limits. No acute osseous pathology. IMPRESSION: Small to moderate  right pleural effusion with consolidative changes of the right lower lobe. This findings are similar to the PET-CT of 12/18/2019. Electronically Signed   By: Anner Crete M.D.   On: 12/21/2019 21:51    ____________________________________________   PROCEDURES  Procedure(s) performed (including Critical Care):  Procedures   ____________________________________________   INITIAL IMPRESSION / MDM / ASSESSMENT AND PLAN / ED COURSE  As part of my medical decision making, I reviewed the following data within the [place] notes reviewed and incorporated, Labs reviewed , Old chart reviewed, Radiograph reviewed , Discussed with admitting physician , Discussed with oncologist and reviewed Notes from prior ED visits   Differential diagnosis includes, but is not limited to, recurrent malignant ascites, recurrent pleural effusion, pulmonary embolism, COVID-19, healthcare associated pneumonia.  Patient's lab work is stable, chest x-ray demonstrates small to moderate pleural effusion, but I suspect that the patient's primary issue is the recurrent ascites pressing on her diaphragm.  I discussed the case by phone with Dr. Mike Gip who confirmed her recommendation and preference for the patient to be admitted for paracentesis which makes sense given that this is the weekend and the patient needs urgent (though not emergent) intervention.  I will paged the hospitalist service to discuss.      Clinical Course as of Dec 22 355  Sun Dec 22, 2019  0343 Discussed case with Dr. Damita Dunnings of the hospitalist service who will admit   [CF]    Clinical Course User Index [CF] Hinda Kehr, MD     ____________________________________________  FINAL CLINICAL IMPRESSION(S) / ED DIAGNOSES  Final diagnoses:  Dyspnea, unspecified type  Malignant ascites  Gynecologic cancer ([place])  Small pleural effusion     MEDICATIONS GIVEN DURING THIS VISIT:  Medications  sodium  chloride flush (NS) 0.9 % injection 3 mL (has no administration in time range)     ED Discharge Orders    None      *Please note:  BLESSING ZAUCHA was evaluated in Emergency Department on 12/22/2019 for the symptoms described in the history of present illness. She was evaluated in the context of the global COVID-19 pandemic, which necessitated consideration that the patient might be at risk for infection with the SARS-CoV-2 virus that causes COVID-19. Institutional protocols and algorithms that pertain to the evaluation of patients at risk for COVID-19 are in a state of rapid change based on information released by regulatory bodies including the CDC and federal and state organizations. These policies and algorithms were followed during the patient's care in the ED.  Some ED evaluations and interventions may be delayed as a result of limited staffing during the pandemic.*  Note:  This document was prepared using Dragon voice recognition software and may include unintentional dictation errors.   Hinda Kehr, MD 12/22/19 [phone]

## 2019-12-22 NOTE — ED Notes (Signed)
Attempt to call report, rn not available. 

## 2019-12-22 NOTE — Progress Notes (Signed)
Patient ID: [name], female   DOB: 1971-12-16, 48 y.o.   MRN: [id number] [facility] PROGRESS NOTE  [name] [id number] DOB: Mar 29, 1971 DOA: 12/22/2019 PCP: Steele Sizer, MD  HPI/Subjective: Patient feeling abdominal pain and bloating.  She recently had a paracentesis with drew off quite a bit of fluid and it reaccumulated very quickly.  Patient also coughing.  Objective: Vitals:   12/22/19 0518 12/22/19 0827  BP:  112/69  Pulse:  100  Resp: 18 14  Temp:  97.9 F (36.6 C)  SpO2:  93%   No intake or output data in the 24 hours ending 12/22/19 1343 Filed Weights   12/21/19 2115  Weight: 112 kg    ROS: Review of Systems  Constitutional: Negative for chills and fever.  Eyes: Negative for blurred vision.  Respiratory: Positive for cough. Negative for shortness of breath.   Cardiovascular: Negative for chest pain.  Gastrointestinal: Positive for abdominal pain and nausea. Negative for constipation, diarrhea and vomiting.  Genitourinary: Negative for dysuria.  Musculoskeletal: Negative for joint pain.  Neurological: Negative for dizziness and headaches.   Exam: Physical Exam  Constitutional: She is oriented to person, place, and time.  HENT:  Nose: No mucosal edema.  Mouth/Throat: No oropharyngeal exudate or posterior oropharyngeal edema.  Eyes: Pupils are equal, round, and reactive to light. Conjunctivae, EOM and lids are normal.  Neck: Carotid bruit is not present.  Cardiovascular: S1 normal and S2 normal. Exam reveals no gallop.  No murmur heard. Pulses:      Dorsalis pedis pulses are 2+ on the right side and 2+ on the left side.  Respiratory: No respiratory distress. She has decreased breath sounds in the right middle field and the right lower field. She has no wheezes. She has no rhonchi. She has no rales.  GI: Soft. Bowel sounds are normal. She exhibits distension. There is abdominal tenderness.  Musculoskeletal:     Right ankle: No swelling.      Left ankle: No swelling.  Lymphadenopathy:    She has no cervical adenopathy.  Neurological: She is alert and oriented to person, place, and time. No cranial nerve deficit.  Skin: Skin is warm. No rash noted. Nails show no clubbing.  Psychiatric: She has a normal mood and affect.      Data Reviewed: Basic Metabolic Panel: Recent Labs  Lab 12/21/19 2126  NA 137  K 3.7  CL 102  CO2 22  GLUCOSE 176*  BUN 9  CREATININE 0.74  CALCIUM 8.2*   Liver Function Tests: Recent Labs  Lab 12/21/19 2126  AST 21  ALT 9  ALKPHOS 74  BILITOT 0.4  PROT 6.2*  ALBUMIN 2.2*   Recent Labs  Lab 12/21/19 2126  LIPASE 28   CBC: Recent Labs  Lab 12/21/19 2126  WBC 11.9*  HGB 14.9  HCT 43.6  MCV 87.4  PLT 619*   BNP (last 3 results) Recent Labs    12/05/19 1451  BNP 24.0    CBG: Recent Labs  Lab 12/18/19 1113  GLUCAP 108*    Recent Results (from the past 240 hour(s))  Respiratory Panel by RT PCR (Flu A&B, Covid) - Nasopharyngeal Swab     Status: None   Collection Time: 12/22/19  4:15 AM   Specimen: Nasopharyngeal Swab  Result Value Ref Range Status   SARS Coronavirus 2 by RT PCR NEGATIVE NEGATIVE Final    Comment: (NOTE) SARS-CoV-2 target nucleic acids are NOT DETECTED. The SARS-CoV-2 RNA is generally detectable  in upper respiratoy specimens during the acute phase of infection. The lowest concentration of SARS-CoV-2 viral copies this assay can detect is 131 copies/mL. A negative result does not preclude SARS-Cov-2 infection and should not be used as the sole basis for treatment or other patient management decisions. A negative result may occur with  improper specimen collection/handling, submission of specimen other than nasopharyngeal swab, presence of viral mutation(s) within the areas targeted by this assay, and inadequate number of viral copies (<131 copies/mL). A negative result must be combined with clinical observations, patient history, and  epidemiological information. The expected result is Negative. Fact Sheet for Patients:  PinkCheek.be Fact Sheet for Healthcare Providers:  GravelBags.it This test is not yet ap proved or cleared by the [place] FDA and  has been authorized for detection and/or diagnosis of SARS-CoV-2 by FDA under an Emergency Use Authorization (EUA). This EUA will remain  in effect (meaning this test can be used) for the duration of the COVID-19 declaration under Section 564(b)(1) of the Act, 21 U.S.C. section 360bbb-3(b)(1), unless the authorization is terminated or revoked sooner.    Influenza A by PCR NEGATIVE NEGATIVE Final   Influenza B by PCR NEGATIVE NEGATIVE Final    Comment: (NOTE) The Xpert Xpress SARS-CoV-2/FLU/RSV assay is intended as an aid in  the diagnosis of influenza from Nasopharyngeal swab specimens and  should not be used as a sole basis for treatment. Nasal washings and  aspirates are unacceptable for Xpert Xpress SARS-CoV-2/FLU/RSV  testing. Fact Sheet for Patients: PinkCheek.be Fact Sheet for Healthcare Providers: GravelBags.it This test is not yet approved or cleared by the [place] FDA and  has been authorized for detection and/or diagnosis of SARS-CoV-2 by  FDA under an Emergency Use Authorization (EUA). This EUA will remain  in effect (meaning this test can be used) for the duration of the  Covid-19 declaration under Section 564(b)(1) of the Act, 21  U.S.C. section 360bbb-3(b)(1), unless the authorization is  terminated or revoked. Performed at [facility], [address] [place], [place] [address]      Studies: DG Chest 2 View  Result Date: 12/21/2019 CLINICAL DATA:  48 year old female with shortness of breath. EXAM: CHEST - 2 VIEW COMPARISON:  Chest radiograph dated 12/06/2019 and PET-CT dated 12/18/2019. FINDINGS: There is  consolidative changes of the right lower lobe, increased since the prior radiograph. There is a small to moderate right pleural effusion. The left lung is clear. No pneumothorax. The cardiac silhouette is within normal limits. No acute osseous pathology. IMPRESSION: Small to moderate right pleural effusion with consolidative changes of the right lower lobe. This findings are similar to the PET-CT of 12/18/2019. Electronically Signed   By: Anner Crete M.D.   On: 12/21/2019 21:51    Scheduled Meds: . DULoxetine  60 mg Oral Daily  . [START ON 12/23/2019] loratadine  10 mg Oral Daily  . multivitamin with minerals  1 tablet Oral Daily  . [START ON 12/23/2019] pantoprazole  40 mg Oral Daily  . topiramate  50 mg Oral BID   Continuous Infusions:  Assessment/Plan:  1. Recurrent ascites.  Case discussed with interventional radiologist to do a paracentesis tomorrow.  Start low-dose Aldactone.  Titrate up depending on blood pressure. 2. Cough and shortness of breath and right pleural effusion.  Case discussed with interventional radiologist to do a thoracentesis tomorrow also. 3. Peritoneal carcinomatosis.  Patient scheduled to have first chemotherapy this week. 4. Sinus tachycardia likely secondary to pleural effusion.  Code  Status:     Code Status Orders  (From admission, onward)         Start     Ordered   12/22/19 0349  Full code  Continuous     12/22/19 0349        Code Status History    This patient has a current code status but no historical code status.   Advance Care Planning Activity     Disposition Plan: We will see how patient feels tomorrow after procedures.  Consultants:  Oncology  Time spent: 35 minutes  [place]

## 2019-12-22 NOTE — Consult Note (Signed)
[facility]  Date of admission:  12/22/2019  Inpatient day:  12/22/2019  Consulting physician:  Dr Judd Gaudier  Reason for Consultation:  Malignant ascites  Chief Complaint: [name] is a 48 y.o. 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  HPI:  The patient presented with progressive shortness of breath.  Imaging studies revealed a large right subpleural effusion and possible RUL mass.  Diagnostic and therapeutic ultrasound guided thoracentesis of 1.2 liters on 12/06/2019 revealed malignant cells c/w adenocarcinoma. Tumor cells were + PAX-8.  She underwent paracentesis on 12/11/2019 of 2.4 liters of fluid.  Pathology confirmed adenocarcinoma. Tumor cells + PAX-8.  She underwent second paracentesis on 12/17/2019 of 5.1 liters.  PET scan on 12/18/2019 revealed extensive abnormal pleural soft tissue along the right hemidiaphragm.   There was associated moderate right pleural effusion, malignant. There was extensive peritoneal disease with omental caking in the abdomen/pelvis.  There was small volume abdominopelvic ascites, malignant.  There was small right IMA and epicardial nodal metastases.  The prior right upper lobe mass had resolved, presumably reflecting atelectasis.  There was stable right lower lobe compressive atelectasis.  Tumor markers have included:  CA125 was 8009.  CA15-3 was 70.3.  CA27.29 was 86.3.  CA19-9 was 150.  She presented to the [facility] ER on 12/21/2019 with increasing abdominal distension.  She could not lay down or sit down without shortness of breath.  She notes fatigue.  She denies any fever or chest pain.  CXR on 12/21/2019 revealed a small to moderate right pleural effusion with consolidative changes of the right lower lobe (similar to PET scan).  She is scheduled to begin neoadjuvant carboplatin, Taxol,  and Avastin on 12/26/2019.   Past Medical History:  Diagnosis Date  . 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   Pathogenic BRCA1 mutation at [place]  . Cancer of bronchus of right upper lobe ([place]) 12/11/2019  . Depression   . Family history of breast cancer   . GERD (gastroesophageal reflux disease)   . Menorrhagia   . Migraines   . Osteoarthritis    back  . Plantar fasciitis     Past Surgical History:  Procedure Laterality Date  . APPENDECTOMY     LSC but "ruptured when they did the surgery"  . CESAREAN SECTION    . IR THORACENTESIS ASP PLEURAL SPACE W/IMG GUIDE  12/06/2019  . PARACENTESIS    . TUBAL LIGATION     at time of CSxn  . WRIST SURGERY Left 11/21/2016   plates and screws inserted    Family History  Adopted: Yes  Problem Relation Age of Onset  . Lung cancer Father        deceased 22  . Breast cancer Mother 12       currently 35  . Colon cancer Mother   . ADD / ADHD Son   . ADD / ADHD Son   . Early death Maternal Aunt   . Breast cancer Maternal Aunt 34       deceased 73  . Breast cancer Maternal Grandmother   . Depression Daughter   . Depression Daughter   . Prostate cancer Paternal Uncle   . Stroke Paternal Uncle   . Leukemia Paternal Aunt   . Breast cancer Paternal Grandmother     Social History:  reports that she has been smoking cigarettes. She has a 10.00 pack-year smoking history.  She has never used smokeless tobacco. She reports current alcohol use. She reports that she does not use drugs.  She previously worked as a Occupational psychologist.  She has 4 children (2 boys and 2 girls) in their 37s.  Her oldest daughter is BRCA1 +.  Allergies: No Known Allergies  Medications Prior to Admission  Medication Sig Dispense Refill  . ALPRAZolam (XANAX) 0.25 MG tablet Take 1 tablet (0.25 mg total) by mouth 2 (two) times daily as needed for anxiety. Please do not drive while on the medication- as this can cause dizziness. 30 tablet 0  . atenolol (TENORMIN) 25 MG tablet Take 1  tablet (25 mg total) by mouth daily. 90 tablet 3  . B Complex-C (B-COMPLEX WITH VITAMIN C) tablet Take 1 tablet by mouth daily.    . Biotin w/ Vitamins C & E (HAIR/SKIN/NAILS PO) Take 1 tablet by mouth daily.    . DULoxetine (CYMBALTA) 60 MG capsule Take 1 capsule (60 mg total) by mouth daily. 90 capsule 0  . fexofenadine (ALLEGRA ALLERGY) 180 MG tablet Take 1 tablet (180 mg total) by mouth daily as needed for allergies or rhinitis. 30 tablet 1  . levonorgestrel (MIRENA) 20 MCG/24HR IUD 1 each by Intrauterine route once.    . Multiple Vitamin (MULTIVITAMIN WITH MINERALS) TABS tablet Take 1 tablet by mouth daily.    Marland Kitchen omeprazole (PRILOSEC) 20 MG capsule Take 1 capsule (20 mg total) by mouth daily. 90 capsule 0  . ondansetron (ZOFRAN) 8 MG tablet One pill every 8 hours as needed for nausea/vomitting. 40 tablet 1  . prochlorperazine (COMPAZINE) 10 MG tablet Take 1 tablet (10 mg total) by mouth every 6 (six) hours as needed for nausea or vomiting. 40 tablet 1  . topiramate (TOPAMAX) 50 MG tablet Take 1 tablet (50 mg total) by mouth 2 (two) times daily. Start with one daily and go up to two daily (Patient taking differently: Take 50 mg by mouth daily. ) 180 tablet 0  . valACYclovir (VALTREX) 1000 MG tablet Take 1 tablet (1,000 mg total) by mouth 2 (two) times daily as needed (outbreak). 6 tablet 0  . hydrOXYzine (ATARAX/VISTARIL) 10 MG tablet Take 1-2 tablets (10-20 mg total) by mouth every 6 (six) hours. (Patient not taking: Reported on 12/22/2019) 60 tablet 0    Review of Systems: GENERAL:  Fatigue.  No fevers, sweats or weight loss.  Weight gain secondary to fluid weight PERFORMANCE STATUS (ECOG):  2 HEENT:  No visual changes, runny nose, sore throat, mouth sores or tenderness. Lungs: Shortness of breath with exertion and most recumbant positions.  No cough.  No hemoptysis. Cardiac:  No chest pain, palpitations, orthopnea, or PND. GI:  Abdominal fullness/discomfort.  No vomiting, diarrhea,  constipation, melena or hematochezia. GU:  No urgency, frequency, dysuria, or hematuria. Musculoskeletal:  No back pain.  No joint pain.  No muscle tenderness. Extremities:  No pain or swelling. Skin:  No rashes or skin changes. Neuro:  No headache, numbness or weakness, balance or coordination issues. Endocrine:  No diabetes, thyroid issues, hot flashes or night sweats. Psych:  No mood changes, depression or anxiety. Pain:  No focal pain. Review of systems:  All other systems reviewed and found to be negative.  Physical Exam:  Blood pressure 112/69, pulse 100, temperature 97.9 F (36.6 C), temperature source Oral, resp. rate 14, height '5\' 3"'$  (1.6 m), weight 247 lb (112 kg), SpO2 93 %.  GENERAL:  Well developed, well nourished, woman sitting comfortably on the  medical unit in no acute distress. MENTAL STATUS:  Alert and oriented to person, place and time. HEAD:  Brown hair pulled up.  Normocephalic, atraumatic, face symmetric, no Cushingoid features. EYES:  Hazel eyes.  Pupils equal round and reactive to light and accomodation.  No conjunctivitis or scleral icterus. ENT:  Oropharynx clear without lesion.  Tongue normal. Mucous membranes moist.  RESPIRATORY:  Decreased breath sounds right lower lobe.  Otherwise, clear to auscultation without rales, wheezes or rhonchi. CARDIOVASCULAR:  Regular rate and rhythm without murmur, rub or gallop. Most comfortable position 30 degrees. ABDOMEN:  Distended.  Soft, non-tender, with active bowel sounds, and no appreciable hepatosplenomegaly.  No masses. SKIN:  Multiple tattoos.  No rashes, ulcers or lesions. EXTREMITIES: No edema, no skin discoloration or tenderness.  No palpable cords. LYMPH NODES: No palpable cervical, supraclavicular, axillary or inguinal adenopathy  NEUROLOGICAL: Unremarkable. PSYCH:  Appropriate.   Results for orders placed or performed during the hospital encounter of 12/22/19 (from the past 48 hour(s))  Lipase, blood      Status: None   Collection Time: 12/21/19  9:26 PM  Result Value Ref Range   Lipase 28 11 - 51 U/L    Comment: Performed at [facility], [address] [place], [place] [address]  Comprehensive metabolic panel     Status: Abnormal   Collection Time: 12/21/19  9:26 PM  Result Value Ref Range   Sodium 137 135 - 145 mmol/L   Potassium 3.7 3.5 - 5.1 mmol/L   Chloride 102 98 - 111 mmol/L   CO2 22 22 - 32 mmol/L   Glucose, Bld 176 (H) 70 - 99 mg/dL   BUN 9 6 - 20 mg/dL   Creatinine, Ser 0.74 0.44 - 1.00 mg/dL   Calcium 8.2 (L) 8.9 - 10.3 mg/dL   Total Protein 6.2 (L) 6.5 - 8.1 g/dL   Albumin 2.2 (L) 3.5 - 5.0 g/dL   AST 21 15 - 41 U/L   ALT 9 0 - 44 U/L   Alkaline Phosphatase 74 38 - 126 U/L   Total Bilirubin 0.4 0.3 - 1.2 mg/dL   GFR calc non Af Amer >60 >60 mL/min   GFR calc Af Amer >60 >60 mL/min   Anion gap 13 5 - 15    Comment: Performed at [facility], [address] [place], [place] [address]  CBC     Status: Abnormal   Collection Time: 12/21/19  9:26 PM  Result Value Ref Range   WBC 11.9 (H) 4.0 - 10.5 K/uL   RBC 4.99 3.87 - 5.11 MIL/uL   Hemoglobin 14.9 12.0 - 15.0 g/dL   HCT 43.6 36.0 - 46.0 %   MCV 87.4 80.0 - 100.0 fL   MCH 29.9 26.0 - 34.0 pg   MCHC 34.2 30.0 - 36.0 g/dL   RDW 12.6 11.5 - 15.5 %   Platelets 619 (H) 150 - 400 K/uL   nRBC 0.0 0.0 - 0.2 %    Comment: Performed at [facility], [address] [place], [place] [address]  Respiratory Panel by RT PCR (Flu A&B, Covid) - Nasopharyngeal Swab     Status: None   Collection Time: 12/22/19  4:15 AM   Specimen: Nasopharyngeal Swab  Result Value Ref Range   SARS Coronavirus 2 by RT PCR NEGATIVE NEGATIVE    Comment: (NOTE) SARS-CoV-2 target nucleic acids are NOT DETECTED. The SARS-CoV-2 RNA is generally detectable in upper respiratoy specimens during the acute phase of infection. The  lowest concentration of SARS-CoV-2 viral copies this assay can detect is 131 copies/mL. A  negative result does not preclude SARS-Cov-2 infection and should not be used as the sole basis for treatment or other patient management decisions. A negative result may occur with  improper specimen collection/handling, submission of specimen other than nasopharyngeal swab, presence of viral mutation(s) within the areas targeted by this assay, and inadequate number of viral copies (<131 copies/mL). A negative result must be combined with clinical observations, patient history, and epidemiological information. The expected result is Negative. Fact Sheet for Patients:  PinkCheek.be Fact Sheet for Healthcare Providers:  GravelBags.it This test is not yet ap proved or cleared by the [place] FDA and  has been authorized for detection and/or diagnosis of SARS-CoV-2 by FDA under an Emergency Use Authorization (EUA). This EUA will remain  in effect (meaning this test can be used) for the duration of the COVID-19 declaration under Section 564(b)(1) of the Act, 21 U.S.C. section 360bbb-3(b)(1), unless the authorization is terminated or revoked sooner.    Influenza A by PCR NEGATIVE NEGATIVE   Influenza B by PCR NEGATIVE NEGATIVE    Comment: (NOTE) The Xpert Xpress SARS-CoV-2/FLU/RSV assay is intended as an aid in  the diagnosis of influenza from Nasopharyngeal swab specimens and  should not be used as a sole basis for treatment. Nasal washings and  aspirates are unacceptable for Xpert Xpress SARS-CoV-2/FLU/RSV  testing. Fact Sheet for Patients: PinkCheek.be Fact Sheet for Healthcare Providers: GravelBags.it This test is not yet approved or cleared by the [place] FDA and  has been authorized for detection and/or diagnosis of SARS-CoV-2 by  FDA under an Emergency Use Authorization (EUA). This EUA will remain  in effect (meaning this test can be used) for the duration  of the  Covid-19 declaration under Section 564(b)(1) of the Act, 21  U.S.C. section 360bbb-3(b)(1), unless the authorization is  terminated or revoked. Performed at [facility], [address] [place], [place] [address]   Protime-INR     Status: None   Collection Time: 12/22/19  4:15 AM  Result Value Ref Range   Prothrombin Time 13.8 11.4 - 15.2 seconds   INR 1.1 0.8 - 1.2    Comment: (NOTE) INR goal varies based on device and disease states. Performed at [facility], [address] [place], [place] [address]    DG Chest 2 View  Result Date: 12/21/2019 CLINICAL DATA:  48 year old female with shortness of breath. EXAM: CHEST - 2 VIEW COMPARISON:  Chest radiograph dated 12/06/2019 and PET-CT dated 12/18/2019. FINDINGS: There is consolidative changes of the right lower lobe, increased since the prior radiograph. There is a small to moderate right pleural effusion. The left lung is clear. No pneumothorax. The cardiac silhouette is within normal limits. No acute osseous pathology. IMPRESSION: Small to moderate right pleural effusion with consolidative changes of the right lower lobe. This findings are similar to the PET-CT of 12/18/2019. Electronically Signed   By: Anner Crete M.D.   On: 12/21/2019 21:51    Assessment:  The patient is a 48 y.o. woman with stage IV primary peritoneal carcinomatosis who is admitted with increasing shortness of breath secondary to recurrent malignancy ascites.  She is BRCA1+.  She is s/p ultrasound guided thoracentesis of 1.2 liters on 12/06/2019.  Pathology revealed malignant cells c/w adenocarcinoma. Tumor cells were + PAX-8.  She underwent paracentesis of 2.4 liters on 12/11/2019 and 5.1 liters on 12/17/2019.  Pathology confirmed adenocarcinoma. Tumor cells +  PAX-8.  CA125 was 8009.  PET scan on 12/18/2019 revealed extensive abnormal pleural soft tissue along the right hemidiaphragm.   There was associated moderate right pleural  effusion, malignant. There was extensive peritoneal disease with omental caking in the abdomen/pelvis.  There was small volume abdominopelvic ascites, malignant.  There was small right IMA and epicardial nodal metastases.  The prior right upper lobe mass had resolved, presumably reflecting atelectasis.  There was stable right lower lobe compressive atelectasis.  Symptomatically, she has increasing abdominal discomfort secondary to ascites.  She feels that her abdomen is distended similarly to when she had 5.1 liters taken off on 12/17/2019.  Plan:   1.   Primary peritoneal carcinomatosis  Patient has stage IV disease (+ pleural fluid).  She is scheduled to begin chemotherapy on 12/26/2019.  She has rapidly progressive ascites.   Anticipate therapeutic paracentesis today or tomorrow.  Patient will likely need scheduled weekly paracentesis until chemotherapy becomes effective.  She appears to have stable pleural fluid. 2.   Disposition  Anticipate discharge home after paracentesis.  Thank you for allowing me to participate in CREASIE LACOSSE 's care.  I will follow her closely with you until Dr Aletha Halim return on 12/23/2019.   Lequita Asal, MD  12/22/2019, 12:08 PM

## 2019-12-23 ENCOUNTER — Observation Stay: Payer: Medicaid Other

## 2019-12-23 ENCOUNTER — Other Ambulatory Visit: Payer: Self-pay | Admitting: Internal Medicine

## 2019-12-23 DIAGNOSIS — Z1501 Genetic susceptibility to malignant neoplasm of breast: Secondary | ICD-10-CM

## 2019-12-23 DIAGNOSIS — C482 Malignant neoplasm of peritoneum, unspecified: Secondary | ICD-10-CM | POA: Diagnosis not present

## 2019-12-23 DIAGNOSIS — R000 Tachycardia, unspecified: Secondary | ICD-10-CM | POA: Diagnosis not present

## 2019-12-23 DIAGNOSIS — J90 Pleural effusion, not elsewhere classified: Secondary | ICD-10-CM | POA: Diagnosis not present

## 2019-12-23 DIAGNOSIS — R0602 Shortness of breath: Secondary | ICD-10-CM | POA: Diagnosis not present

## 2019-12-23 DIAGNOSIS — R0600 Dyspnea, unspecified: Secondary | ICD-10-CM | POA: Diagnosis not present

## 2019-12-23 DIAGNOSIS — R180 Malignant ascites: Secondary | ICD-10-CM | POA: Diagnosis not present

## 2019-12-23 LAB — BODY FLUID CELL COUNT WITH DIFFERENTIAL
Eos, Fluid: 0 %
Eos, Fluid: 14 %
Lymphs, Fluid: 77 %
Lymphs, Fluid: 71 %
Monocyte-Macrophage-Serous Fluid: 20 %
Monocyte-Macrophage-Serous Fluid: 15 %
Neutrophil Count, Fluid: 3 %
Neutrophil Count, Fluid: 0 %
Total Nucleated Cell Count, Fluid: 1420 cu mm
Total Nucleated Cell Count, Fluid: 3286 cu mm

## 2019-12-23 LAB — BASIC METABOLIC PANEL
Anion gap: 9 (ref 5–15)
BUN: 11 mg/dL (ref 6–20)
CO2: 23 mmol/L (ref 22–32)
Calcium: 8 mg/dL — ABNORMAL LOW (ref 8.9–10.3)
Chloride: 99 mmol/L (ref 98–111)
Creatinine, Ser: 0.61 mg/dL (ref 0.44–1.00)
GFR calc Af Amer: >60 mL/min (ref >60)
GFR calc non Af Amer: >60 mL/min (ref >60)
Glucose, Bld: 112 mg/dL — ABNORMAL HIGH (ref 70–99)
Potassium: 4 mmol/L (ref 3.5–5.1)
Sodium: 131 mmol/L — ABNORMAL LOW (ref 135–145)

## 2019-12-23 LAB — GLUCOSE, PLEURAL OR PERITONEAL FLUID: Glucose, Fluid: 109 mg/dL

## 2019-12-23 LAB — LACTATE DEHYDROGENASE, PLEURAL OR PERITONEAL FLUID: LD, Fluid: 438 U/L — ABNORMAL HIGH (ref 3–23)

## 2019-12-23 LAB — PROTEIN, PLEURAL OR PERITONEAL FLUID
Total protein, fluid: 3.6 g/dL
Total protein, fluid: 3.8 g/dL

## 2019-12-23 LAB — LACTATE DEHYDROGENASE: LDH: 169 U/L (ref 98–192)

## 2019-12-23 MED ORDER — FAMOTIDINE IN NACL 20-0.9 MG/50ML-% IV SOLN
20.0000 mg | Freq: Once | INTRAVENOUS | Status: AC
  Administered 2019-12-23: 15:00:00 20 mg via INTRAVENOUS
  Filled 2019-12-23: qty 50

## 2019-12-23 MED ORDER — CEFDINIR 300 MG PO CAPS: 300.0000 mg | ORAL_CAPSULE | Freq: Two times a day (BID) | ORAL | 0 refills | Status: DC

## 2019-12-23 MED ORDER — PALONOSETRON HCL INJECTION 0.25 MG/5ML
0.2500 mg | Freq: Once | INTRAVENOUS | Status: AC
  Administered 2019-12-23: 0.25 mg via INTRAVENOUS
  Filled 2019-12-23: qty 5

## 2019-12-23 MED ORDER — DIPHENHYDRAMINE HCL 50 MG/ML IJ SOLN
50.0000 mg | Freq: Once | INTRAMUSCULAR | Status: AC
  Administered 2019-12-23: 15:00:00 50 mg via INTRAVENOUS
  Filled 2019-12-23: qty 1

## 2019-12-23 MED ORDER — SODIUM CHLORIDE 0.9 % IV SOLN
900.0000 mg | Freq: Once | INTRAVENOUS | Status: AC
  Administered 2019-12-23: 21:00:00 900 mg via INTRAVENOUS
  Filled 2019-12-23: qty 90

## 2019-12-23 MED ORDER — SODIUM CHLORIDE 0.9 % IV SOLN
10.0000 mg | Freq: Once | INTRAVENOUS | Status: AC
  Administered 2019-12-23: 16:00:00 10 mg via INTRAVENOUS
  Filled 2019-12-23: qty 1

## 2019-12-23 MED ORDER — TOPIRAMATE 25 MG PO TABS: 50.0000 mg | ORAL_TABLET | Freq: Every day | ORAL | Status: DC

## 2019-12-23 MED ORDER — ENSURE ENLIVE PO LIQD: 237.0000 mL | Freq: Two times a day (BID) | ORAL | 0 refills | Status: DC

## 2019-12-23 MED ORDER — SODIUM CHLORIDE 0.9 % IV SOLN
150.0000 mg | Freq: Once | INTRAVENOUS | Status: AC
  Administered 2019-12-23: 15:00:00 150 mg via INTRAVENOUS
  Filled 2019-12-23: qty 5

## 2019-12-23 MED ORDER — SODIUM CHLORIDE 0.9 % IV SOLN
200.0000 mg/m2 | Freq: Once | INTRAVENOUS | Status: AC
  Administered 2019-12-23: 456 mg via INTRAVENOUS
  Filled 2019-12-23: qty 76

## 2019-12-23 MED ORDER — METOPROLOL TARTRATE 25 MG PO TABS: 12.5000 mg | ORAL_TABLET | Freq: Two times a day (BID) | ORAL | 0 refills | Status: DC

## 2019-12-23 MED ORDER — CEFDINIR 300 MG PO CAPS
300.0000 mg | ORAL_CAPSULE | Freq: Two times a day (BID) | ORAL | Status: DC
  Administered 2019-12-23: 300 mg via ORAL
  Filled 2019-12-23: qty 1

## 2019-12-23 MED ORDER — TOPIRAMATE 50 MG PO TABS: 50.0000 mg | ORAL_TABLET | Freq: Every day | ORAL | Status: DC

## 2019-12-23 MED ORDER — ENSURE ENLIVE PO LIQD: 237.0000 mL | Freq: Two times a day (BID) | ORAL | Status: DC

## 2019-12-23 MED ORDER — SPIRONOLACTONE 25 MG PO TABS: 25.0000 mg | ORAL_TABLET | Freq: Every day | ORAL | 0 refills | Status: DC

## 2019-12-23 NOTE — Plan of Care (Signed)
  Problem: Education: Goal: Knowledge of the prescribed therapeutic regimen will improve Outcome: Progressing   Problem: Activity: Goal: Ability to implement measures to reduce episodes of fatigue will improve Outcome: Progressing   Problem: Education: Goal: Knowledge of the prescribed therapeutic regimen will improve Outcome: Progressing   Problem: Bowel/Gastric: Goal: Will not experience complications related to bowel motility Outcome: Progressing   Problem: Coping: Goal: Ability to identify and develop effective coping behavior will improve Outcome: Progressing   Problem: Nutritional: Goal: Maintenance of adequate nutrition will improve Outcome: Progressing   Problem: Health Behavior/Discharge Planning: Goal: Ability to manage health-related needs will improve Outcome: Progressing   Problem: Clinical Measurements: Goal: Ability to maintain clinical measurements within normal limits will improve Outcome: Progressing Goal: Will remain free from infection Outcome: Progressing Goal: Diagnostic test results will improve Outcome: Progressing Goal: Respiratory complications will improve Outcome: Progressing   Problem: Clinical Measurements: Goal: Will remain free from infection Outcome: Progressing   Problem: Activity: Goal: Risk for activity intolerance will decrease Outcome: Progressing

## 2019-12-23 NOTE — Discharge Summary (Signed)
[facility] NAME: [name]    MR#:  [id number]  DATE OF BIRTH:  26-Jul-1971  DATE OF ADMISSION:  12/22/2019 ADMITTING PHYSICIAN: Athena Masse, MD  DATE OF DISCHARGE: 12/23/2019  PRIMARY CARE PHYSICIAN: Steele Sizer, MD    ADMISSION DIAGNOSIS:  Malignant ascites [R18.0] Gynecologic cancer ([place]) [C57.9] Dyspnea, unspecified type [R06.00] Small pleural effusion [J90]  DISCHARGE DIAGNOSIS:  Principal Problem:   Acute dyspnea Active Problems:   Primary peritoneal carcinomatosis (HCC)   Malignant ascites   Pleural effusion on right   Tachycardia   SECONDARY DIAGNOSIS:   Past Medical History:  Diagnosis Date  . 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   Pathogenic BRCA1 mutation at [place]  . Cancer of bronchus of right upper lobe ([place]) 12/11/2019  . Depression   . Family history of breast cancer   . GERD (gastroesophageal reflux disease)   . Menorrhagia   . Migraines   . Osteoarthritis    back  . Plantar fasciitis     HOSPITAL COURSE:   1.  Recurrent malignant ascites.  Patient had abdominal paracentesis today removing 4.3 L.  White blood cell count high will start empiric antibiotic third-generation cephalosporin Omnicef for 7 days.  Follow-up cultures as outpatient.  Can consider prophylactic antibiotic as outpatient to prevent SBP.  Started low-dose Aldactone.  Can continue titrating Aldactone as outpatient.  Since she is getting chemotherapy I am hesitant on Lasix at this time.  Case discussed with Dr. Rogue Bussing oncology.  Patient will likely need a prescription from him for weekly paracentesis to avoid hospitalization.  Her ascites will continue to accumulate until chemotherapy kicks in. 2.  Right pleural effusion.  Patient had a right thoracentesis done today removing 650 mL of fluid.  Patient has no shortness of breath post procedure.  Prior to the procedure she did have shortness of breath and cough. 3.  Peritoneal  carcinomatosis of gynecological origin.  Dr. Rogue Bussing ordered first chemotherapy here in the hospital.  The patient would like to go home after chemotherapy finished today. 4.  Sinus tachycardia.  Patient does take atenolol at home this was switched over to low-dose metoprolol.  This is likely secondary to pleural effusion.  DISCHARGE CONDITIONS:   Fair  CONSULTS OBTAINED:  Oncology Interventional radiology  DRUG ALLERGIES:  No Known Allergies  DISCHARGE MEDICATIONS:   Allergies as of 12/23/2019   No Known Allergies     Medication List    STOP taking these medications   atenolol 25 MG tablet Commonly known as: TENORMIN     TAKE these medications   ALPRAZolam 0.25 MG tablet Commonly known as: XANAX Take 1 tablet (0.25 mg total) by mouth 2 (two) times daily as needed for anxiety. Please do not drive while on the medication- as this can cause dizziness.   B-complex with vitamin C tablet Take 1 tablet by mouth daily.   cefdinir 300 MG capsule Commonly known as: OMNICEF Take 1 capsule (300 mg total) by mouth every 12 (twelve) hours.   DULoxetine 60 MG capsule Commonly known as: CYMBALTA Take 1 capsule (60 mg total) by mouth daily.   feeding supplement (ENSURE ENLIVE) Liqd Take 237 mLs by mouth 2 (two) times daily between meals. Start taking on: December 24, 2019   fexofenadine 180 MG tablet Commonly known as: Allegra Allergy Take 1 tablet (180 mg total) by mouth daily as needed for allergies or rhinitis.   HAIR/SKIN/NAILS PO Take 1 tablet by mouth daily.  levonorgestrel 20 MCG/24HR IUD Commonly known as: MIRENA 1 each by Intrauterine route once.   metoprolol tartrate 25 MG tablet Commonly known as: LOPRESSOR Take 0.5 tablets (12.5 mg total) by mouth 2 (two) times daily.   multivitamin with minerals Tabs tablet Take 1 tablet by mouth daily.   omeprazole 20 MG capsule Commonly known as: PRILOSEC Take 1 capsule (20 mg total) by mouth daily.    ondansetron 8 MG tablet Commonly known as: ZOFRAN One pill every 8 hours as needed for nausea/vomitting.   prochlorperazine 10 MG tablet Commonly known as: COMPAZINE Take 1 tablet (10 mg total) by mouth every 6 (six) hours as needed for nausea or vomiting.   spironolactone 25 MG tablet Commonly known as: ALDACTONE Take 1 tablet (25 mg total) by mouth daily. Start taking on: December 24, 2019   topiramate 50 MG tablet Commonly known as: Topamax Take 1 tablet (50 mg total) by mouth daily.   valACYclovir 1000 MG tablet Commonly known as: VALTREX Take 1 tablet (1,000 mg total) by mouth 2 (two) times daily as needed (outbreak).        DISCHARGE INSTRUCTIONS:   Follow-up PMD 1 week Follow-up Dr. Rogue Bussing oncology 3 days  If you experience worsening of your admission symptoms, develop shortness of breath, life threatening emergency, suicidal or homicidal thoughts you must seek medical attention immediately by calling 911 or calling your MD immediately  if symptoms less severe.  You Must read complete instructions/literature along with all the possible adverse reactions/side effects for all the Medicines you take and that have been prescribed to you. Take any new Medicines after you have completely understood and accept all the possible adverse reactions/side effects.   Please note  You were cared for by a hospitalist during your hospital stay. If you have any questions about your discharge medications or the care you received while you were in the hospital after you are discharged, you can call the unit and asked to speak with the hospitalist on call if the hospitalist that took care of you is not available. Once you are discharged, your primary care physician will handle any further medical issues. Please note that NO REFILLS for any discharge medications will be authorized once you are discharged, as it is imperative that you return to your primary care physician (or establish a  relationship with a primary care physician if you do not have one) for your aftercare needs so that they can reassess your need for medications and monitor your lab values.    Today   CHIEF COMPLAINT:   Chief Complaint  Patient presents with  . Shortness of Breath  . Abdominal Pain    HISTORY OF PRESENT ILLNESS:  [name]  is a 48 y.o. female came in with shortness of breath and abdominal pain   VITAL SIGNS:  Blood pressure 113/88, pulse 82, temperature 98.3 F (36.8 C), resp. rate 17, height '5\' 3"'$  (1.6 m), weight 112 kg, SpO2 94 %.   PHYSICAL EXAMINATION:  Patient seen this morning prior to procedures.  GENERAL:  48 y.o.-year-old patient lying in the bed with no acute distress.  EYES: Pupils equal, round, reactive to light and accommodation. No scleral icterus. Extraocular muscles intact.  HEENT: Head atraumatic, normocephalic. Oropharynx and nasopharynx clear.  NECK:  Supple, no jugular venous distention. No thyroid enlargement, no tenderness.  LUNGS: Decreased breath sounds right base, no wheezing, rales,rhonchi or crepitation. No use of accessory muscles of respiration.  CARDIOVASCULAR: S1, S2 normal. No murmurs,  rubs, or gallops.  ABDOMEN: Soft, non-tender, distended. Bowel sounds present. No organomegaly or mass.  EXTREMITIES: No pedal edema, cyanosis, or clubbing.  NEUROLOGIC: Cranial nerves II through XII are intact. Muscle strength 5/5 in all extremities. Sensation intact. Gait not checked.  PSYCHIATRIC: The patient is alert and oriented x 3.  SKIN: No obvious rash, lesion, or ulcer.   DATA REVIEW:   CBC Recent Labs  Lab 12/21/19 2126  WBC 11.9*  HGB 14.9  HCT 43.6  PLT 619*    Chemistries  Recent Labs  Lab 12/21/19 2126 12/23/19 0555  NA 137 131*  K 3.7 4.0  CL 102 99  CO2 22 23  GLUCOSE 176* 112*  BUN 9 11  CREATININE 0.74 0.61  CALCIUM 8.2* 8.0*  AST 21  --   ALT 9  --   ALKPHOS 74  --   BILITOT 0.4  --     Microbiology Results   Results for orders placed or performed during the hospital encounter of 12/22/19  Respiratory Panel by RT PCR (Flu A&B, Covid) - Nasopharyngeal Swab     Status: None   Collection Time: 12/22/19  4:15 AM   Specimen: Nasopharyngeal Swab  Result Value Ref Range Status   SARS Coronavirus 2 by RT PCR NEGATIVE NEGATIVE Final    Comment: (NOTE) SARS-CoV-2 target nucleic acids are NOT DETECTED. The SARS-CoV-2 RNA is generally detectable in upper respiratoy specimens during the acute phase of infection. The lowest concentration of SARS-CoV-2 viral copies this assay can detect is 131 copies/mL. A negative result does not preclude SARS-Cov-2 infection and should not be used as the sole basis for treatment or other patient management decisions. A negative result may occur with  improper specimen collection/handling, submission of specimen other than nasopharyngeal swab, presence of viral mutation(s) within the areas targeted by this assay, and inadequate number of viral copies (<131 copies/mL). A negative result must be combined with clinical observations, patient history, and epidemiological information. The expected result is Negative. Fact Sheet for Patients:  PinkCheek.be Fact Sheet for Healthcare Providers:  GravelBags.it This test is not yet ap proved or cleared by the [place] FDA and  has been authorized for detection and/or diagnosis of SARS-CoV-2 by FDA under an Emergency Use Authorization (EUA). This EUA will remain  in effect (meaning this test can be used) for the duration of the COVID-19 declaration under Section 564(b)(1) of the Act, 21 U.S.C. section 360bbb-3(b)(1), unless the authorization is terminated or revoked sooner.    Influenza A by PCR NEGATIVE NEGATIVE Final   Influenza B by PCR NEGATIVE NEGATIVE Final    Comment: (NOTE) The Xpert Xpress SARS-CoV-2/FLU/RSV assay is intended as an aid in  the diagnosis of  influenza from Nasopharyngeal swab specimens and  should not be used as a sole basis for treatment. Nasal washings and  aspirates are unacceptable for Xpert Xpress SARS-CoV-2/FLU/RSV  testing. Fact Sheet for Patients: PinkCheek.be Fact Sheet for Healthcare Providers: GravelBags.it This test is not yet approved or cleared by the [place] FDA and  has been authorized for detection and/or diagnosis of SARS-CoV-2 by  FDA under an Emergency Use Authorization (EUA). This EUA will remain  in effect (meaning this test can be used) for the duration of the  Covid-19 declaration under Section 564(b)(1) of the Act, 21  U.S.C. section 360bbb-3(b)(1), unless the authorization is  terminated or revoked. Performed at [facility], [address] [place], [place] [address]     RADIOLOGY:  Darletta Moll  Chest 2 View  Result Date: 12/21/2019 CLINICAL DATA:  48 year old female with shortness of breath. EXAM: CHEST - 2 VIEW COMPARISON:  Chest radiograph dated 12/06/2019 and PET-CT dated 12/18/2019. FINDINGS: There is consolidative changes of the right lower lobe, increased since the prior radiograph. There is a small to moderate right pleural effusion. The left lung is clear. No pneumothorax. The cardiac silhouette is within normal limits. No acute osseous pathology. IMPRESSION: Small to moderate right pleural effusion with consolidative changes of the right lower lobe. This findings are similar to the PET-CT of 12/18/2019. Electronically Signed   By: Anner Crete M.D.   On: 12/21/2019 21:51   US Paracentesis  Result Date: 12/23/2019 INDICATION: Recurrent malignant ascites EXAM: ULTRASOUND GUIDED  PARACENTESIS MEDICATIONS: None. COMPLICATIONS: None immediate. PROCEDURE: Informed written consent was obtained from the patient after a discussion of the risks, benefits and alternatives to treatment. A timeout was performed prior to the initiation  of the procedure. Initial ultrasound scanning demonstrates a moderate amount of ascites within the right lower abdominal quadrant. The right lower abdomen was prepped and draped in the usual sterile fashion. 1% lidocaine was used for local anesthesia. Following this, a 6 French Safe-T-Centesis catheter was introduced. An ultrasound image was saved for documentation purposes. The paracentesis was performed. The catheter was removed and a dressing was applied. The patient tolerated the procedure well without immediate post procedural complication. Patient received post-procedure intravenous albumin; see nursing notes for details. FINDINGS: A total of approximately 4.3 L of dark yellow fluid was removed. Samples were sent to the laboratory as requested by the clinical team. IMPRESSION: Successful ultrasound-guided paracentesis yielding 4.3 liters of peritoneal fluid. Electronically Signed   By: Kathreen Devoid   On: 12/23/2019 11:04   DG Chest Port 1 View  Result Date: 12/23/2019 CLINICAL DATA:  Status post right thoracentesis today. EXAM: PORTABLE CHEST 1 VIEW COMPARISON:  PA and lateral chest 12/21/2019. FINDINGS: Right pleural effusion is decreased after thoracentesis. No pneumothorax. Trace left effusion noted. Lungs clear. Heart size normal. No acute or focal bony abnormality. IMPRESSION: Decreased right pleural effusion after thoracentesis. No pneumothorax. Trace left pleural effusion. Electronically Signed   By: Inge Rise M.D.   On: 12/23/2019 11:20   US THORACENTESIS ASP PLEURAL SPACE W/IMG GUIDE  Result Date: 12/23/2019 INDICATION: Shortness of breath. Cough. Right pleural effusion. Diagnostic thoracentesis requested. EXAM: ULTRASOUND GUIDED RIGHT THORACENTESIS MEDICATIONS: None. COMPLICATIONS: None immediate. PROCEDURE: An ultrasound guided thoracentesis was thoroughly discussed with the patient and questions answered. The benefits, risks, alternatives and complications were also discussed. The  patient understands and wishes to proceed with the procedure. Written consent was obtained. Ultrasound was performed to localize and mark an adequate pocket of fluid in the right chest. The area was then prepped and draped in the normal sterile fashion. 1% Lidocaine was used for local anesthesia. Under ultrasound guidance a 6 Fr Safe-T-Centesis catheter was introduced. Thoracentesis was performed. The catheter was removed and a dressing applied. FINDINGS: A total of approximately 650 mL of amber colored fluid was removed. Samples were sent to the laboratory as requested by the clinical team. IMPRESSION: Successful ultrasound guided right thoracentesis yielding 650 mL of pleural fluid. Electronically Signed   By: Kathreen Devoid   On: 12/23/2019 11:06     Management plans discussed with the patient, family and they are in agreement.  CODE STATUS:     Code Status Orders  (From admission, onward)         Start  Ordered   12/22/19 0349  Full code  Continuous     12/22/19 0349        Code Status History    This patient has a current code status but no historical code status.   Advance Care Planning Activity      TOTAL TIME TAKING CARE OF THIS PATIENT: 30 minutes.    Loletha Grayer M.D on 12/23/2019 at 3:47 PM  Between 7am to 6pm - Pager - [phone]  After 6pm go to [email or web address] - password [email or web address]  [facility]  CC: Primary care physician; Steele Sizer, MD

## 2019-12-23 NOTE — Assessment & Plan Note (Signed)
#  48 year old female patient with BRCA-1 positive high-grade serous adenocarcinoma is currently admitted hospital for worsening abdominal distention/ascites.  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 However, given the recurrent/frequent ascites-I think is reasonable to proceed with chemotherapy cycle #1 in the hospital.  Avastin unable to be given in hospital because of evaluating issues.  We will plan proceeding with Avastin with cycle #2 of carbotaxol.   #Ascites/right-sided pleural effusion-s/p paracentesis/thoracentesis.  Worsening symptomatic ascites as per patient-proceed with paracentesis is planned today.  We will monitor patient closely-follow-up on paracentesis.  Hopefully the chemotherapy-we will help treat ascites/pleural effusions.  #Discussed with pharmacy; also discussed with RN.  Also discussed with Dr. Leslye Peer.  Patient could potentially be discharged home after receiving the chemotherapy.  Patient will keep her appointment for follow-up as planned on dec 31st.

## 2019-12-23 NOTE — Progress Notes (Signed)
Patient to get carbo/taxol only today

## 2019-12-23 NOTE — Progress Notes (Signed)
Initial Nutrition Assessment  DOCUMENTATION CODES:   Obesity unspecified  INTERVENTION:  Provide Ensure Enlive po BID, each supplement provides 350 kcal and 20 grams of protein. Patient prefers strawberry.  Encouraged ongoing adequate intake at meals in setting of increased calorie/protein needs.  NUTRITION DIAGNOSIS:   Increased nutrient needs related to cancer and cancer related treatments as evidenced by estimated needs.  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   Patient reports her appetite was decreased PTA in setting of nausea and ascites. Her appetite is much better now after starting new nausea medication prescribed by Dr. Rogue Bussing. She also had a paracentesis today that removed 4.3 L of fluid and a thoracentesis also today that removed 650 mL of fluid. She is eating 100% of her meals here. Discussed that she still has increased calorie/protein needs and she is amenable to drinking ONS.  Patient reports her UBW was around 260 lbs. According to chart she was 117.5 kg on 12/15. Unsure if that might have been falsely elevated due to ascites as prior in the year patient was only around 104 kg. She was 112 kg on 12/26.  Medications reviewed and include: Carboplatin, MVI daily, pantoprazole, spironolactone, Decadron 10 mg IV once today.  Labs reviewed: Sodium 131.  NUTRITION - FOCUSED PHYSICAL EXAM:  Unable to complete at this time.  Diet Order:   Diet Order            Diet regular Room service appropriate? Yes; Fluid consistency: Thin  Diet effective now             EDUCATION NEEDS:   Education needs have been  addressed  Skin:  Skin Assessment: Reviewed RN Assessment  Last BM:  12/21/2019 per chart  Height:   Ht Readings from Last 1 Encounters:  12/21/19 _0  (1.6 m)   Weight:   Wt Readings from Last 1 Encounters:  12/21/19 112 kg   Ideal Body Weight:  52.3 kg  BMI:  Body mass index is 43.75 kg/m.  Estimated Nutritional Needs:   Kcal:  2200-2400  Protein:  110-120 grams  Fluid:  per MD  Jacklynn Barnacle, MS, RD, LDN Office: [phone] Pager: [phone] After Hours/Weekend Pager: [phone]

## 2019-12-23 NOTE — Patient Instructions (Signed)
Bevacizumab injection What is this medicine? BEVACIZUMAB (be va SIZ yoo mab) is a monoclonal antibody. It is used to treat many types of cancer. This medicine may be used for other purposes; ask your health care provider or pharmacist if you have questions. COMMON BRAND NAME(S): Avastin, MVASI, Zirabev What should I tell my health care provider before I take this medicine? They need to know if you have any of these conditions:  diabetes  heart disease  high blood pressure  history of coughing up blood  prior anthracycline chemotherapy (e.g., doxorubicin, daunorubicin, epirubicin)  recent or ongoing radiation therapy  recent or planning to have surgery  stroke  an unusual or allergic reaction to bevacizumab, hamster proteins, mouse proteins, other medicines, foods, dyes, or preservatives  pregnant or trying to get pregnant  breast-feeding How should I use this medicine? This medicine is for infusion into a vein. It is given by a health care professional in a hospital or clinic setting. Talk to your pediatrician regarding the use of this medicine in children. Special care may be needed. Overdosage: If you think you have taken too much of this medicine contact a poison control center or emergency room at once. NOTE: This medicine is only for you. Do not share this medicine with others. What if I miss a dose? It is important not to miss your dose. Call your doctor or health care professional if you are unable to keep an appointment. What may interact with this medicine? Interactions are not expected. This list may not describe all possible interactions. Give your health care provider a list of all the medicines, herbs, non-prescription drugs, or dietary supplements you use. Also tell them if you smoke, drink alcohol, or use illegal drugs. Some items may interact with your medicine. What should I watch for while using this medicine? Your condition will be monitored carefully while  you are receiving this medicine. You will need important blood work and urine testing done while you are taking this medicine. This medicine may increase your risk to bruise or bleed. Call your doctor or health care professional if you notice any unusual bleeding. This medicine should be started at least 28 days following major surgery and the site of the surgery should be totally healed. Check with your doctor before scheduling dental work or surgery while you are receiving this treatment. Talk to your doctor if you have recently had surgery or if you have a wound that has not healed. Do not become pregnant while taking this medicine or for 6 months after stopping it. Women should inform their doctor if they wish to become pregnant or think they might be pregnant. There is a potential for serious side effects to an unborn child. Talk to your health care professional or pharmacist for more information. Do not breast-feed an infant while taking this medicine and for 6 months after the last dose. This medicine has caused ovarian failure in some women. This medicine may interfere with the ability to have a child. You should talk to your doctor or health care professional if you are concerned about your fertility. What side effects may I notice from receiving this medicine? Side effects that you should report to your doctor or health care professional as soon as possible:  allergic reactions like skin rash, itching or hives, swelling of the face, lips, or tongue  chest pain or chest tightness  chills  coughing up blood  high fever  seizures  severe constipation  signs and symptoms  of bleeding such as bloody or black, tarry stools; red or dark-brown urine; spitting up blood or brown material that looks like coffee grounds; red spots on the skin; unusual bruising or bleeding from the eye, gums, or nose  signs and symptoms of a blood clot such as breathing problems; chest pain; severe, sudden  headache; pain, swelling, warmth in the leg  signs and symptoms of a stroke like changes in vision; confusion; trouble speaking or understanding; severe headaches; sudden numbness or weakness of the face, arm or leg; trouble walking; dizziness; loss of balance or coordination  stomach pain  sweating  swelling of legs or ankles  vomiting  weight gain Side effects that usually do not require medical attention (report to your doctor or health care professional if they continue or are bothersome):  back pain  changes in taste  decreased appetite  dry skin  nausea  tiredness This list may not describe all possible side effects. Call your doctor for medical advice about side effects. You may report side effects to FDA at 1-800-FDA-1088. Where should I keep my medicine? This drug is given in a hospital or clinic and will not be stored at home. NOTE: This sheet is a summary. It may not cover all possible information. If you have questions about this medicine, talk to your doctor, pharmacist, or health care provider.  2020 [employer]/Gold Standard (2016-12-09 14:33:29) Paclitaxel injection What is this medicine? PACLITAXEL (PAK li TAX el) is a chemotherapy drug. It targets fast dividing cells, like cancer cells, and causes these cells to die. This medicine is used to treat ovarian cancer, breast cancer, lung cancer, Kaposi's sarcoma, and other cancers. This medicine may be used for other purposes; ask your health care provider or pharmacist if you have questions. COMMON BRAND NAME(S): Onxol, Taxol What should I tell my health care provider before I take this medicine? They need to know if you have any of these conditions:  history of irregular heartbeat  liver disease  low blood counts, like low white cell, platelet, or red cell counts  lung or breathing disease, like asthma  tingling of the fingers or toes, or other nerve disorder  an unusual or allergic reaction to  paclitaxel, alcohol, polyoxyethylated castor oil, other chemotherapy, other medicines, foods, dyes, or preservatives  pregnant or trying to get pregnant  breast-feeding How should I use this medicine? This drug is given as an infusion into a vein. It is administered in a hospital or clinic by a specially trained health care professional. Talk to your pediatrician regarding the use of this medicine in children. Special care may be needed. Overdosage: If you think you have taken too much of this medicine contact a poison control center or emergency room at once. NOTE: This medicine is only for you. Do not share this medicine with others. What if I miss a dose? It is important not to miss your dose. Call your doctor or health care professional if you are unable to keep an appointment. What may interact with this medicine? Do not take this medicine with any of the following medications:  disulfiram  metronidazole This medicine may also interact with the following medications:  antiviral medicines for hepatitis, HIV or AIDS  certain antibiotics like erythromycin and clarithromycin  certain medicines for fungal infections like ketoconazole and itraconazole  certain medicines for seizures like carbamazepine, phenobarbital, phenytoin  gemfibrozil  nefazodone  rifampin  St. John's wort This list may not describe all possible interactions. Give your health  care provider a list of all the medicines, herbs, non-prescription drugs, or dietary supplements you use. Also tell them if you smoke, drink alcohol, or use illegal drugs. Some items may interact with your medicine. What should I watch for while using this medicine? Your condition will be monitored carefully while you are receiving this medicine. You will need important blood work done while you are taking this medicine. This medicine can cause serious allergic reactions. To reduce your risk you will need to take other medicine(s)  before treatment with this medicine. If you experience allergic reactions like skin rash, itching or hives, swelling of the face, lips, or tongue, tell your doctor or health care professional right away. In some cases, you may be given additional medicines to help with side effects. Follow all directions for their use. This drug may make you feel generally unwell. This is not uncommon, as chemotherapy can affect healthy cells as well as cancer cells. Report any side effects. Continue your course of treatment even though you feel ill unless your doctor tells you to stop. Call your doctor or health care professional for advice if you get a fever, chills or sore throat, or other symptoms of a cold or flu. Do not treat yourself. This drug decreases your body's ability to fight infections. Try to avoid being around people who are sick. This medicine may increase your risk to bruise or bleed. Call your doctor or health care professional if you notice any unusual bleeding. Be careful brushing and flossing your teeth or using a toothpick because you may get an infection or bleed more easily. If you have any dental work done, tell your dentist you are receiving this medicine. Avoid taking products that contain aspirin, acetaminophen, ibuprofen, naproxen, or ketoprofen unless instructed by your doctor. These medicines may hide a fever. Do not become pregnant while taking this medicine. Women should inform their doctor if they wish to become pregnant or think they might be pregnant. There is a potential for serious side effects to an unborn child. Talk to your health care professional or pharmacist for more information. Do not breast-feed an infant while taking this medicine. Men are advised not to father a child while receiving this medicine. This product may contain alcohol. Ask your pharmacist or healthcare provider if this medicine contains alcohol. Be sure to tell all healthcare providers you are taking this  medicine. Certain medicines, like metronidazole and disulfiram, can cause an unpleasant reaction when taken with alcohol. The reaction includes flushing, headache, nausea, vomiting, sweating, and increased thirst. The reaction can last from 30 minutes to several hours. What side effects may I notice from receiving this medicine? Side effects that you should report to your doctor or health care professional as soon as possible:  allergic reactions like skin rash, itching or hives, swelling of the face, lips, or tongue  breathing problems  changes in vision  fast, irregular heartbeat  high or low blood pressure  mouth sores  pain, tingling, numbness in the hands or feet  signs of decreased platelets or bleeding - bruising, pinpoint red spots on the skin, black, tarry stools, blood in the urine  signs of decreased red blood cells - unusually weak or tired, feeling faint or lightheaded, falls  signs of infection - fever or chills, cough, sore throat, pain or difficulty passing urine  signs and symptoms of liver injury like dark yellow or brown urine; general ill feeling or flu-like symptoms; light-colored stools; loss of appetite; nausea; right  upper belly pain; unusually weak or tired; yellowing of the eyes or skin  swelling of the ankles, feet, hands  unusually slow heartbeat Side effects that usually do not require medical attention (report to your doctor or health care professional if they continue or are bothersome):  diarrhea  hair loss  loss of appetite  muscle or joint pain  nausea, vomiting  pain, redness, or irritation at site where injected  tiredness This list may not describe all possible side effects. Call your doctor for medical advice about side effects. You may report side effects to FDA at 1-800-FDA-1088. Where should I keep my medicine? This drug is given in a hospital or clinic and will not be stored at home. NOTE: This sheet is a summary. It may not  cover all possible information. If you have questions about this medicine, talk to your doctor, pharmacist, or health care provider.  2020 [employer]/Gold Standard (2017-08-15 13:14:55) Carboplatin injection What is this medicine? CARBOPLATIN (KAR boe pla tin) is a chemotherapy drug. It targets fast dividing cells, like cancer cells, and causes these cells to die. This medicine is used to treat ovarian cancer and many other cancers. This medicine may be used for other purposes; ask your health care provider or pharmacist if you have questions. COMMON BRAND NAME(S): Paraplatin What should I tell my health care provider before I take this medicine? They need to know if you have any of these conditions:  blood disorders  hearing problems  kidney disease  recent or ongoing radiation therapy  an unusual or allergic reaction to carboplatin, cisplatin, other chemotherapy, other medicines, foods, dyes, or preservatives  pregnant or trying to get pregnant  breast-feeding How should I use this medicine? This drug is usually given as an infusion into a vein. It is administered in a hospital or clinic by a specially trained health care professional. Talk to your pediatrician regarding the use of this medicine in children. Special care may be needed. Overdosage: If you think you have taken too much of this medicine contact a poison control center or emergency room at once. NOTE: This medicine is only for you. Do not share this medicine with others. What if I miss a dose? It is important not to miss a dose. Call your doctor or health care professional if you are unable to keep an appointment. What may interact with this medicine?  medicines for seizures  medicines to increase blood counts like filgrastim, pegfilgrastim, sargramostim  some antibiotics like amikacin, gentamicin, neomycin, streptomycin, tobramycin  vaccines Talk to your doctor or health care professional before taking any of  these medicines:  acetaminophen  aspirin  ibuprofen  ketoprofen  naproxen This list may not describe all possible interactions. Give your health care provider a list of all the medicines, herbs, non-prescription drugs, or dietary supplements you use. Also tell them if you smoke, drink alcohol, or use illegal drugs. Some items may interact with your medicine. What should I watch for while using this medicine? Your condition will be monitored carefully while you are receiving this medicine. You will need important blood work done while you are taking this medicine. This drug may make you feel generally unwell. This is not uncommon, as chemotherapy can affect healthy cells as well as cancer cells. Report any side effects. Continue your course of treatment even though you feel ill unless your doctor tells you to stop. In some cases, you may be given additional medicines to help with side effects. Follow all directions  for their use. Call your doctor or health care professional for advice if you get a fever, chills or sore throat, or other symptoms of a cold or flu. Do not treat yourself. This drug decreases your body's ability to fight infections. Try to avoid being around people who are sick. This medicine may increase your risk to bruise or bleed. Call your doctor or health care professional if you notice any unusual bleeding. Be careful brushing and flossing your teeth or using a toothpick because you may get an infection or bleed more easily. If you have any dental work done, tell your dentist you are receiving this medicine. Avoid taking products that contain aspirin, acetaminophen, ibuprofen, naproxen, or ketoprofen unless instructed by your doctor. These medicines may hide a fever. Do not become pregnant while taking this medicine. Women should inform their doctor if they wish to become pregnant or think they might be pregnant. There is a potential for serious side effects to an unborn child.  Talk to your health care professional or pharmacist for more information. Do not breast-feed an infant while taking this medicine. What side effects may I notice from receiving this medicine? Side effects that you should report to your doctor or health care professional as soon as possible:  allergic reactions like skin rash, itching or hives, swelling of the face, lips, or tongue  signs of infection - fever or chills, cough, sore throat, pain or difficulty passing urine  signs of decreased platelets or bleeding - bruising, pinpoint red spots on the skin, black, tarry stools, nosebleeds  signs of decreased red blood cells - unusually weak or tired, fainting spells, lightheadedness  breathing problems  changes in hearing  changes in vision  chest pain  high blood pressure  low blood counts - This drug may decrease the number of white blood cells, red blood cells and platelets. You may be at increased risk for infections and bleeding.  nausea and vomiting  pain, swelling, redness or irritation at the injection site  pain, tingling, numbness in the hands or feet  problems with balance, talking, walking  trouble passing urine or change in the amount of urine Side effects that usually do not require medical attention (report to your doctor or health care professional if they continue or are bothersome):  hair loss  loss of appetite  metallic taste in the mouth or changes in taste This list may not describe all possible side effects. Call your doctor for medical advice about side effects. You may report side effects to FDA at 1-800-FDA-1088. Where should I keep my medicine? This drug is given in a hospital or clinic and will not be stored at home. NOTE: This sheet is a summary. It may not cover all possible information. If you have questions about this medicine, talk to your doctor, pharmacist, or health care provider.  2020 [employer]/Gold Standard (2008-03-18 14:38:05)

## 2019-12-23 NOTE — Progress Notes (Signed)
[name]   DOB:1971/09/06   NA#:[id number]    Subjective: Patient resting in the bed having her breakfast.  Complains of shortness of breath on exertion.  Complains abdominal distention/discomfort.  There is no nausea no vomiting.  Objective:  Vitals:   12/23/19 1007 12/23/19 1102  BP: (!) 133/101 113/88  Pulse: 88 82  Resp:    Temp:    SpO2: 94% 94%     Intake/Output Summary (Last 24 hours) at 12/23/2019 1335 Last data filed at 12/23/2019 1300 Gross per 24 hour  Intake 480 ml  Output --  Net 480 ml    Physical Exam  Constitutional: She is oriented to person, place, and time and well-developed, well-nourished, and in no distress.  HENT:  Head: Normocephalic and atraumatic.  Mouth/Throat: Oropharynx is clear and moist. No oropharyngeal exudate.  Eyes: Pupils are equal, round, and reactive to light.  Cardiovascular: Normal rate and regular rhythm.  Pulmonary/Chest: No respiratory distress. She has no wheezes.  Slightly decreased breath sounds on the right compared to left.  Abdominal: Soft. Bowel sounds are normal. She exhibits no distension and no mass. There is no abdominal tenderness. There is no rebound and no guarding.  Abdominal distention noted.  Musculoskeletal:        General: No tenderness or edema. Normal range of motion.     Cervical back: Normal range of motion and neck supple.  Neurological: She is alert and oriented to person, place, and time.  Skin: Skin is warm.  Psychiatric: Affect normal.     Labs:  Lab Results  Component Value Date   WBC 11.9 (H) 12/21/2019   HGB 14.9 12/21/2019   HCT 43.6 12/21/2019   MCV 87.4 12/21/2019   PLT 619 (H) 12/21/2019   NEUTROABS 7.4 12/11/2019    Lab Results  Component Value Date   NA 131 (L) 12/23/2019   K 4.0 12/23/2019   CL 99 12/23/2019   CO2 23 12/23/2019    Studies:  DG Chest 2 View  Result Date: 12/21/2019 CLINICAL DATA:  48 year old female with shortness of breath. EXAM: CHEST - 2 VIEW  COMPARISON:  Chest radiograph dated 12/06/2019 and PET-CT dated 12/18/2019. FINDINGS: There is consolidative changes of the right lower lobe, increased since the prior radiograph. There is a small to moderate right pleural effusion. The left lung is clear. No pneumothorax. The cardiac silhouette is within normal limits. No acute osseous pathology. IMPRESSION: Small to moderate right pleural effusion with consolidative changes of the right lower lobe. This findings are similar to the PET-CT of 12/18/2019. Electronically Signed   By: Anner Crete M.D.   On: 12/21/2019 21:51   US Paracentesis  Result Date: 12/23/2019 INDICATION: Recurrent malignant ascites EXAM: ULTRASOUND GUIDED  PARACENTESIS MEDICATIONS: None. COMPLICATIONS: None immediate. PROCEDURE: Informed written consent was obtained from the patient after a discussion of the risks, benefits and alternatives to treatment. A timeout was performed prior to the initiation of the procedure. Initial ultrasound scanning demonstrates a moderate amount of ascites within the right lower abdominal quadrant. The right lower abdomen was prepped and draped in the usual sterile fashion. 1% lidocaine was used for local anesthesia. Following this, a 6 French Safe-T-Centesis catheter was introduced. An ultrasound image was saved for documentation purposes. The paracentesis was performed. The catheter was removed and a dressing was applied. The patient tolerated the procedure well without immediate post procedural complication. Patient received post-procedure intravenous albumin; see nursing notes for details. FINDINGS: A total of approximately 4.3 L  of dark yellow fluid was removed. Samples were sent to the laboratory as requested by the clinical team. IMPRESSION: Successful ultrasound-guided paracentesis yielding 4.3 liters of peritoneal fluid. Electronically Signed   By: Kathreen Devoid   On: 12/23/2019 11:04   DG Chest Port 1 View  Result Date: 12/23/2019 CLINICAL  DATA:  Status post right thoracentesis today. EXAM: PORTABLE CHEST 1 VIEW COMPARISON:  PA and lateral chest 12/21/2019. FINDINGS: Right pleural effusion is decreased after thoracentesis. No pneumothorax. Trace left effusion noted. Lungs clear. Heart size normal. No acute or focal bony abnormality. IMPRESSION: Decreased right pleural effusion after thoracentesis. No pneumothorax. Trace left pleural effusion. Electronically Signed   By: Inge Rise M.D.   On: 12/23/2019 11:20   US THORACENTESIS ASP PLEURAL SPACE W/IMG GUIDE  Result Date: 12/23/2019 INDICATION: Shortness of breath. Cough. Right pleural effusion. Diagnostic thoracentesis requested. EXAM: ULTRASOUND GUIDED RIGHT THORACENTESIS MEDICATIONS: None. COMPLICATIONS: None immediate. PROCEDURE: An ultrasound guided thoracentesis was thoroughly discussed with the patient and questions answered. The benefits, risks, alternatives and complications were also discussed. The patient understands and wishes to proceed with the procedure. Written consent was obtained. Ultrasound was performed to localize and mark an adequate pocket of fluid in the right chest. The area was then prepped and draped in the normal sterile fashion. 1% Lidocaine was used for local anesthesia. Under ultrasound guidance a 6 Fr Safe-T-Centesis catheter was introduced. Thoracentesis was performed. The catheter was removed and a dressing applied. FINDINGS: A total of approximately 650 mL of amber colored fluid was removed. Samples were sent to the laboratory as requested by the clinical team. IMPRESSION: Successful ultrasound guided right thoracentesis yielding 650 mL of pleural fluid. Electronically Signed   By: Kathreen Devoid   On: 12/23/2019 11:06    Primary peritoneal carcinomatosis [facility]) #48 year old female patient with BRCA-1 positive high-grade serous adenocarcinoma is currently admitted hospital for worsening abdominal distention/ascites.  # Adenocarcinoma-serous-gynecologic  origin/-PAX8 positive/BRCA1 positive. stage IV-pleural fluid positive-patient due to get chemotherapy carbotaxol plus Avastin on 31st. However, given the recurrent/frequent ascites-I think is reasonable to proceed with chemotherapy cycle #1 in the hospital.  Avastin unable to be given in hospital because of evaluating issues.  We will plan proceeding with Avastin with cycle #2 of carbotaxol.   #Ascites/right-sided pleural effusion-s/p paracentesis/thoracentesis.  Worsening symptomatic ascites as per patient-proceed with paracentesis is planned today.  We will monitor patient closely-follow-up on paracentesis.  Hopefully the chemotherapy-we will help treat ascites/pleural effusions.  #Discussed with pharmacy; also discussed with RN.  Also discussed with Dr. Leslye Peer.  Patient could potentially be discharged home after receiving the chemotherapy.  Patient will keep her appointment for follow-up as planned on dec 31st.   Cammie Sickle, MD 12/23/2019  1:35 PM

## 2019-12-24 ENCOUNTER — Other Ambulatory Visit: Payer: Self-pay

## 2019-12-24 ENCOUNTER — Inpatient Hospital Stay: Payer: Medicaid Other

## 2019-12-24 ENCOUNTER — Encounter: Payer: Self-pay | Admitting: Nurse Practitioner

## 2019-12-24 ENCOUNTER — Inpatient Hospital Stay: Payer: Medicaid Other | Admitting: Nurse Practitioner

## 2019-12-24 ENCOUNTER — Inpatient Hospital Stay (HOSPITAL_BASED_OUTPATIENT_CLINIC_OR_DEPARTMENT_OTHER): Payer: Medicaid Other | Admitting: Nurse Practitioner

## 2019-12-24 DIAGNOSIS — C482 Malignant neoplasm of peritoneum, unspecified: Secondary | ICD-10-CM | POA: Diagnosis present

## 2019-12-24 DIAGNOSIS — Z8042 Family history of malignant neoplasm of prostate: Secondary | ICD-10-CM | POA: Diagnosis not present

## 2019-12-24 DIAGNOSIS — R188 Other ascites: Secondary | ICD-10-CM | POA: Diagnosis not present

## 2019-12-24 DIAGNOSIS — Z803 Family history of malignant neoplasm of breast: Secondary | ICD-10-CM | POA: Diagnosis not present

## 2019-12-24 DIAGNOSIS — D72828 Other elevated white blood cell count: Secondary | ICD-10-CM | POA: Diagnosis not present

## 2019-12-24 DIAGNOSIS — Z5989 Other problems related to housing and economic circumstances: Secondary | ICD-10-CM

## 2019-12-24 DIAGNOSIS — J910 Malignant pleural effusion: Secondary | ICD-10-CM | POA: Diagnosis not present

## 2019-12-24 DIAGNOSIS — Z598 Other problems related to housing and economic circumstances: Secondary | ICD-10-CM

## 2019-12-24 DIAGNOSIS — Z800 Family history of malignant neoplasm of digestive organs: Secondary | ICD-10-CM | POA: Diagnosis not present

## 2019-12-24 DIAGNOSIS — Z79899 Other long term (current) drug therapy: Secondary | ICD-10-CM | POA: Diagnosis not present

## 2019-12-24 DIAGNOSIS — C801 Malignant (primary) neoplasm, unspecified: Secondary | ICD-10-CM

## 2019-12-24 DIAGNOSIS — Z1502 Genetic susceptibility to malignant neoplasm of ovary: Secondary | ICD-10-CM | POA: Diagnosis not present

## 2019-12-24 DIAGNOSIS — Z1501 Genetic susceptibility to malignant neoplasm of breast: Secondary | ICD-10-CM | POA: Diagnosis not present

## 2019-12-24 DIAGNOSIS — C3411 Malignant neoplasm of upper lobe, right bronchus or lung: Secondary | ICD-10-CM | POA: Diagnosis not present

## 2019-12-24 DIAGNOSIS — R180 Malignant ascites: Secondary | ICD-10-CM | POA: Diagnosis not present

## 2019-12-24 DIAGNOSIS — F329 Major depressive disorder, single episode, unspecified: Secondary | ICD-10-CM | POA: Diagnosis not present

## 2019-12-24 DIAGNOSIS — Z801 Family history of malignant neoplasm of trachea, bronchus and lung: Secondary | ICD-10-CM | POA: Diagnosis not present

## 2019-12-24 DIAGNOSIS — D72829 Elevated white blood cell count, unspecified: Secondary | ICD-10-CM | POA: Diagnosis not present

## 2019-12-24 DIAGNOSIS — E8809 Other disorders of plasma-protein metabolism, not elsewhere classified: Secondary | ICD-10-CM

## 2019-12-24 DIAGNOSIS — F17210 Nicotine dependence, cigarettes, uncomplicated: Secondary | ICD-10-CM | POA: Diagnosis not present

## 2019-12-24 DIAGNOSIS — F419 Anxiety disorder, unspecified: Secondary | ICD-10-CM | POA: Diagnosis not present

## 2019-12-24 LAB — PH, BODY FLUID: pH, Body Fluid: 7.6

## 2019-12-24 NOTE — Progress Notes (Signed)
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 COMPLICATIONS: None immediate. PROCEDURE: An ultrasound guided thoracentesis was thoroughly discussed with the patient and questions answered. The benefits, risks, alternatives and complications were also discussed. The patient understands and wishes to proceed with the procedure. Written consent was obtained. Ultrasound was performed to localize and mark an adequate pocket of fluid in the right chest. The area was then prepped and draped in the normal sterile fashion. 1% Lidocaine was used for local anesthesia. Under ultrasound guidance a 6 Fr Safe-T-Centesis catheter was introduced. Thoracentesis was performed. The catheter was removed and a dressing applied. FINDINGS: A total of approximately 1.2 L of hazy, amber colored fluid was removed. Samples were sent to the laboratory as requested by the clinical team. IMPRESSION: Successful ultrasound guided right thoracentesis yielding 1.2 L of pleural fluid. Read by: Ascencion Dike PA-C Electronically Signed   By: Corrie Mckusick D.O.   On:  12/06/2019 15:39   US THORACENTESIS ASP PLEURAL SPACE W/IMG GUIDE  Result Date: 12/23/2019 INDICATION: Shortness of breath. Cough. Right pleural effusion. Diagnostic thoracentesis requested. EXAM: ULTRASOUND GUIDED RIGHT THORACENTESIS MEDICATIONS: None. COMPLICATIONS: None immediate. PROCEDURE: An ultrasound guided thoracentesis was thoroughly discussed with the patient and questions answered. The benefits, risks, alternatives and complications were also discussed. The patient understands and wishes to proceed with the procedure. Written consent was obtained. Ultrasound was performed to localize and mark  an adequate pocket of fluid in the right chest. The area was then prepped and draped in the normal sterile fashion. 1% Lidocaine was used for local anesthesia. Under ultrasound guidance a 6 Fr Safe-T-Centesis catheter was introduced. Thoracentesis was performed. The catheter was removed and a dressing applied. FINDINGS: A total of approximately 650 mL of amber colored fluid was removed. Samples were sent to the laboratory as requested by the clinical team. IMPRESSION: Successful ultrasound guided right thoracentesis yielding 650 mL of pleural fluid. Electronically Signed   By: Kathreen Devoid   On: 12/23/2019 11:06     Assessment and plan- Patient is a 48 y.o. female who presents to [facility] for initial meeting in preparation for starting chemotherapy for the treatment of stage IV adenocarcinoma of mullerian origin   1. 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 Stage IV due to pleural fluid positive for malignancy. Currently s/p D1C1 of carbo-taxol plus avastin on 12/23/2019 during hospitalization. Avastin held due to hospitalization but plan to proceed with avastin for cycle 2. Given BRCA positivity, would consider potential benefit of PARP inhibitor for maintenance.   Patient previously declined port placement and is now interested in port.  Discussed with Dr. Rogue Bussing. She received port education today and his team will discuss with patient at her next visit and coordinate care.   2. Recurrent malignant ascites- worsening symptomatic ascites s/p palliative paracentesis. On spironolactone. Peritoneal fluid culture revealed no growth < 24 hours. Discussed with Dr. Rogue Bussing who will see patient later this week and consider standing order for paracentesis.   3. Malignant right sided pleural effusion- symptomatic shortness of breath s/p palliative thoracentesis. Pleural fluid culture revealed no growth < 24 hours.   4. Leukocytosis- etiology unclear- infectious vs malignancy. On omnicef post hospitalization. Tolerating well. Continue to monitor. Plan to repeat labs at next visit.   4. Chemo Care Clinic/High Risk for ER/Hospitalization during chemotherapy- We discussed the role of the chemo care clinic and identified patient specific risk factors. I discussed that patient was identified as high risk primarily based on: past hospitalizations and er utilization, not identifying as being in a relationship, and history of depression.   She currently has a PCP, Dr. Ancil Boozer whom she sees regularly.   5. Social Determinants of Health- we discussed that social determinants of health may have significant impacts on health and outcomes for cancer patients.  Today we discussed specific social determinants of performance status, alcohol use, depression, financial needs, food insecurity, housing, interpersonal violence, social connections, stress, tobacco use, and transportation.  After lengthy discussion the following were identified as areas of need:   Based on performance status/activity level we discussed options including home based and outpatient services, DME, and CARE program. We discusssed that patients who participate in regular physical activity report fewer negative impacts of cancer and treatments and report less fatigue.   Based on  depression: we discussed options for care including psychiatry for medication management, counseling services, or palliative care/symptom management as well as primary care providers. Symptoms currently managed by primary care which patient will continue.   Based on financial insecurity: We discussed that living with cancer can create tremendous financial burden.  We discussed options for assistance. I asked that if assistance is needed in affording medications or paying bills to please let us know so that we can provide assistance.   Based on food insecurity-we discussed options for food including social services.  We will also notify Elease Etienne  to see if cancer center can provide support.  Patient informed of food pantry at cancer center and was provided with care package today.  Please notify nursing if un-met needs.  Based on housing insecurity: patient has been contact by Elease Etienne, LCSW who is providing assistance with rent, utility bills, etc. Encouraged patient to contact department of social services to investigate programs that she would be eligible for. She has planned to apply for Medicaid today.   Based on lack of social connections-we discussed options for support groups at the cancer center. If interested, please notify nurse navigator to enroll.   Based on concern of stress-we discussed options for managing stress including healthy eating, exercise as well as participating in no charge counseling services at the cancer center and support groups.  If these are of interest, patient can notify either myself or primary nursing team.  Based on tobacco use-smoking cessation was encouraged.  We discussed options for management including medications and self referral to quit Smart program  Based on transportation need: we discussed options for transportation including [employer], paratransit, bus routes, [employer] transit, taxi/uber/lyft, and cancer center van.  I have notified primary oncology team  who will help assist with arranging Lucianne Lei transportation for appointments when needed. We also discussed options for transportation on short notice/acute visits.   6. Palliative Care- based on stage of cancer and/or identified needs today, I will refer patient to palliative care for goals of care and advanced care planning.  We also discussed the role of the Symptom Management Clinic at [facility] for acute issues and methods of contacting clinic/provider. She denies needing specific assistance at this time and She will be followed by Mariea Clonts, RN (Nurse Navigator).   Visit Diagnosis 1. Adenocarcinoma ([place])   2. Malignant ascites   3. Other elevated white blood cell (WBC) count   4. Under or uninsured   5. Malignant pleural effusion     Patient expressed understanding and was in agreement with this plan. She also understands that She can call clinic at any time with any questions, concerns, or complaints.   A total of (35) minutes of face-to-face time was spent with this patient with greater than 50% of that time in counseling and care-coordination.  Beckey Rutter, DNP, AGNP-C [facility] [place] [facility]  CC: Dr. Rogue Bussing, Dr. Ancil Boozer, Billey Chang, NP, Mariea Clonts, RN

## 2019-12-24 NOTE — Progress Notes (Signed)
Patient received first chemo infusion yesterday in hospital.  Patient states feeling "good".  States appetite has increased and denies any "bloating" or feeling like she is accumulating any fluid.   Patient sat through chemo class well.  Very pleasant.

## 2019-12-25 ENCOUNTER — Encounter: Payer: Self-pay | Admitting: Internal Medicine

## 2019-12-25 ENCOUNTER — Other Ambulatory Visit: Payer: Self-pay

## 2019-12-25 ENCOUNTER — Telehealth: Payer: Self-pay | Admitting: Licensed Clinical Social Worker

## 2019-12-25 DIAGNOSIS — K5900 Constipation, unspecified: Secondary | ICD-10-CM

## 2019-12-25 MED ORDER — POLYETHYLENE GLYCOL 3350 17 GM/SCOOP PO POWD: 1.0000 | Freq: Once | ORAL | 0 refills | Status: AC

## 2019-12-25 NOTE — Telephone Encounter (Signed)
Patient called requesting miralax due to lack of bowel movement in 4 days. Informed patient it is over the counter but she used to work there and said they would add a discount card to if it is called in. Ok per Dr. B to call in 17 grams of Miralax, take daily as needed.

## 2019-12-26 ENCOUNTER — Other Ambulatory Visit: Payer: Self-pay

## 2019-12-26 ENCOUNTER — Other Ambulatory Visit: Payer: Self-pay | Admitting: *Deleted

## 2019-12-26 ENCOUNTER — Inpatient Hospital Stay: Payer: Medicaid Other

## 2019-12-26 ENCOUNTER — Other Ambulatory Visit: Payer: Self-pay | Admitting: Internal Medicine

## 2019-12-26 ENCOUNTER — Ambulatory Visit
Admission: RE | Admit: 2019-12-26 | Discharge: 2019-12-26 | Disposition: A | Discharge status: Home / Self Care | Payer: Medicaid Other | Source: Ambulatory Visit | Attending: Internal Medicine | Admitting: Internal Medicine

## 2019-12-26 ENCOUNTER — Ambulatory Visit: Payer: Self-pay

## 2019-12-26 ENCOUNTER — Encounter: Payer: Self-pay | Admitting: Pharmacy Technician

## 2019-12-26 ENCOUNTER — Inpatient Hospital Stay (HOSPITAL_BASED_OUTPATIENT_CLINIC_OR_DEPARTMENT_OTHER): Payer: Medicaid Other | Admitting: Internal Medicine

## 2019-12-26 DIAGNOSIS — C3411 Malignant neoplasm of upper lobe, right bronchus or lung: Secondary | ICD-10-CM | POA: Diagnosis not present

## 2019-12-26 DIAGNOSIS — C482 Malignant neoplasm of peritoneum, unspecified: Secondary | ICD-10-CM

## 2019-12-26 DIAGNOSIS — R180 Malignant ascites: Secondary | ICD-10-CM | POA: Diagnosis not present

## 2019-12-26 LAB — URINALYSIS, COMPLETE (UACMP) WITH MICROSCOPIC
Bacteria, UA: NONE SEEN
Bilirubin Urine: NEGATIVE
Glucose, UA: NEGATIVE mg/dL
Hgb urine dipstick: NEGATIVE
Ketones, ur: NEGATIVE mg/dL
Nitrite: NEGATIVE
Protein, ur: 30 mg/dL — AB
Specific Gravity, Urine: 1.014 (ref 1.005–1.030)
Squamous Epithelial / HPF: >50 — ABNORMAL HIGH (ref 0–5)
pH: 5 (ref 5.0–8.0)

## 2019-12-26 LAB — CBC WITH DIFFERENTIAL/PLATELET
Abs Immature Granulocytes: 0.04 10*3/uL (ref 0.00–0.07)
Basophils Absolute: 0.1 10*3/uL (ref 0.0–0.1)
Basophils Relative: 1 %
Eosinophils Absolute: 0.1 10*3/uL (ref 0.0–0.5)
Eosinophils Relative: 1 %
HCT: 44.2 % (ref 36.0–46.0)
Hemoglobin: 14.3 g/dL (ref 12.0–15.0)
Immature Granulocytes: 0 %
Lymphocytes Relative: 9 %
Lymphs Abs: 0.9 10*3/uL (ref 0.7–4.0)
MCH: 29.6 pg (ref 26.0–34.0)
MCHC: 32.4 g/dL (ref 30.0–36.0)
MCV: 91.5 fL (ref 80.0–100.0)
Monocytes Absolute: 0.1 10*3/uL (ref 0.1–1.0)
Monocytes Relative: 1 %
Neutro Abs: 8.7 10*3/uL — ABNORMAL HIGH (ref 1.7–7.7)
Neutrophils Relative %: 88 %
Platelets: 516 10*3/uL — ABNORMAL HIGH (ref 150–400)
RBC: 4.83 MIL/uL (ref 3.87–5.11)
RDW: 12.5 % (ref 11.5–15.5)
WBC: 9.9 10*3/uL (ref 4.0–10.5)
nRBC: 0 % (ref 0.0–0.2)

## 2019-12-26 LAB — COMPREHENSIVE METABOLIC PANEL
ALT: 15 U/L (ref 0–44)
AST: 23 U/L (ref 15–41)
Albumin: 2.1 g/dL — ABNORMAL LOW (ref 3.5–5.0)
Alkaline Phosphatase: 56 U/L (ref 38–126)
Anion gap: 10 (ref 5–15)
BUN: 13 mg/dL (ref 6–20)
CO2: 22 mmol/L (ref 22–32)
Calcium: 7.9 mg/dL — ABNORMAL LOW (ref 8.9–10.3)
Chloride: 99 mmol/L (ref 98–111)
Creatinine, Ser: 0.75 mg/dL (ref 0.44–1.00)
GFR calc Af Amer: >60 mL/min (ref >60)
GFR calc non Af Amer: >60 mL/min (ref >60)
Glucose, Bld: 109 mg/dL — ABNORMAL HIGH (ref 70–99)
Potassium: 4.4 mmol/L (ref 3.5–5.1)
Sodium: 131 mmol/L — ABNORMAL LOW (ref 135–145)
Total Bilirubin: 0.4 mg/dL (ref 0.3–1.2)
Total Protein: 5.8 g/dL — ABNORMAL LOW (ref 6.5–8.1)

## 2019-12-26 LAB — CYTOLOGY - NON PAP

## 2019-12-26 MED ORDER — TRAMADOL HCL 50 MG PO TABS: 50.0000 mg | ORAL_TABLET | Freq: Three times a day (TID) | ORAL | 0 refills | Status: DC | PRN

## 2019-12-26 NOTE — Progress Notes (Signed)
Patient has been approved for drug assistance by [employer] for M[employer]. The enrollment period is from 12/26/19-12/25/20 based on self pay. First DOS covered is 01/13/20.

## 2019-12-26 NOTE — Progress Notes (Signed)
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 procedure. Written consent was obtained. Ultrasound was performed to localize and mark an adequate pocket of fluid in the right chest. The area was then prepped and draped in the normal sterile fashion. 1% Lidocaine was used for local anesthesia. Under ultrasound guidance a 6 Fr Safe-T-Centesis catheter was introduced. Thoracentesis was performed. The catheter was removed and a dressing applied. FINDINGS: A total of approximately 650 mL of amber colored fluid was removed. Samples were sent to the laboratory as requested by the clinical team. IMPRESSION: Successful ultrasound guided right thoracentesis yielding 650 mL of pleural fluid. 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 Currently on neoadjuvant chemotherapy carbotaxol.  #Patient status post carbotaxol chemotherapy in 12/28; Avastin not given because of unavailability.  Tolerated  chemotherapy fairly well.  #Proceed with carbotaxol Avastin chemotherapy on January 18.  #I again reviewed the plan to proceed with total of 4 cycles of carbotaxol Avastin chemotherapy.  This is typically followed by surgery followed by completion of adjuvant therapy.  Given BRCA positive disease-patient would benefit from Parp inhibitor maintenance therapy.  #Malignant ascites-status post recent paracentesis on 12/28- worse again; proceed with para today.  Discussed that patient symptoms might also come from carcinomatosis.  Await ultrasound today.  #Patient emotionally distressed obviously-offered support.  On Xanax for anxiety- STABLE.   #Patient interested  in port placement because of poor IV access.  Discussed that we would like to avoid port placement close to Avastin infusion-given the wound healing problems.  Will check to see if we can do the port placement next week.  # DISPOSITION:  # paracentesis today as ordered.  # port referral to IR ASAP/next week.  # Jan 18th- MD; labs-Cbcc/cmp/ca-125;UA; carbo-taxol-Avastin- Dr.B     Cammie Sickle, MD 12/26/2019 11:23 AM

## 2019-12-26 NOTE — Assessment & Plan Note (Addendum)
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 Currently on neoadjuvant chemotherapy carbotaxol.  #Patient status post carbotaxol chemotherapy in 12/28; Avastin not given because of unavailability.  Tolerated chemotherapy fairly well.  #Proceed with carbotaxol Avastin chemotherapy on January 18.  #I again reviewed the plan to proceed with total of 4 cycles of carbotaxol Avastin chemotherapy.  This is typically followed by surgery followed by completion of adjuvant therapy.  Given BRCA positive disease-patient would benefit from Parp inhibitor maintenance therapy.  #Malignant ascites-status post recent paracentesis on 12/28- worse again; proceed with para today.  Discussed that patient symptoms might also come from carcinomatosis.  Await ultrasound today.  #Patient emotionally distressed obviously-offered support.  On Xanax for anxiety- STABLE.   #Patient interested in port placement because of poor IV access.  Discussed that we would like to avoid port placement close to Avastin infusion-given the wound healing problems.  Will check to see if we can do the port placement next week.  # mucositis- mild from chemo; monitor for now; salt-baking soda rinses.   #Abdominal pain/discomfort likely secondary to malignant ascites-await paracentesis above.   # DISPOSITION:  # paracentesis today as ordered.  # port referral to IR ASAP/next week.  # Jan 18th- MD; labs-Cbcc/cmp/ca-125;UA; carbo-taxol-Avastin- Dr.B  Addendum: Recommend tramadol for abdominal discomfort.  Prescription sent.

## 2019-12-27 LAB — BODY FLUID CULTURE
Culture: NO GROWTH
Culture: NO GROWTH

## 2019-12-30 ENCOUNTER — Inpatient Hospital Stay (HOSPITAL_COMMUNITY): Payer: Medicaid Other

## 2019-12-30 ENCOUNTER — Ambulatory Visit (HOSPITAL_COMMUNITY)
Admission: RE | Admit: 2019-12-30 | Discharge: 2019-12-30 | Disposition: A | Discharge status: Home / Self Care | Payer: Medicaid Other | Source: Ambulatory Visit | Attending: Oncology | Admitting: Oncology

## 2019-12-30 ENCOUNTER — Other Ambulatory Visit: Payer: Self-pay | Admitting: Oncology

## 2019-12-30 ENCOUNTER — Telehealth: Payer: Self-pay | Admitting: *Deleted

## 2019-12-30 ENCOUNTER — Other Ambulatory Visit: Payer: Self-pay

## 2019-12-30 ENCOUNTER — Other Ambulatory Visit: Payer: Self-pay | Admitting: Internal Medicine

## 2019-12-30 ENCOUNTER — Inpatient Hospital Stay: Payer: Medicaid Other | Attending: Oncology | Admitting: Oncology

## 2019-12-30 DIAGNOSIS — Z5112 Encounter for antineoplastic immunotherapy: Secondary | ICD-10-CM | POA: Insufficient documentation

## 2019-12-30 DIAGNOSIS — C801 Malignant (primary) neoplasm, unspecified: Secondary | ICD-10-CM | POA: Insufficient documentation

## 2019-12-30 DIAGNOSIS — I82601 Acute embolism and thrombosis of unspecified veins of right upper extremity: Secondary | ICD-10-CM | POA: Diagnosis not present

## 2019-12-30 DIAGNOSIS — I82611 Acute embolism and thrombosis of superficial veins of right upper extremity: Secondary | ICD-10-CM | POA: Insufficient documentation

## 2019-12-30 DIAGNOSIS — Z5111 Encounter for antineoplastic chemotherapy: Secondary | ICD-10-CM | POA: Insufficient documentation

## 2019-12-30 DIAGNOSIS — C482 Malignant neoplasm of peritoneum, unspecified: Secondary | ICD-10-CM | POA: Insufficient documentation

## 2019-12-30 DIAGNOSIS — R0600 Dyspnea, unspecified: Secondary | ICD-10-CM

## 2019-12-30 MED ORDER — RIVAROXABAN (XARELTO) VTE STARTER PACK (15 & 20 MG): ORAL_TABLET | ORAL | 0 refills | Status: DC

## 2019-12-30 MED ORDER — PEGFILGRASTIM-JMDB 6 MG/0.6ML ~~LOC~~ SOSY: 6.0000 mg | PREFILLED_SYRINGE | Freq: Once | SUBCUTANEOUS | 6 refills | Status: AC

## 2019-12-30 MED ORDER — ENOXAPARIN SODIUM 120 MG/0.8ML ~~LOC~~ SOLN
120.0000 mg | Freq: Once | SUBCUTANEOUS | Status: AC
  Administered 2019-12-30: 120 mg via SUBCUTANEOUS
  Filled 2019-12-30: qty 0.8

## 2019-12-30 NOTE — Progress Notes (Signed)
Virtual Visit via Telephone Note  I connected with [name] on 12/31/19 at  1:00 PM EST by telephone and verified that I am speaking with the correct person using two identifiers.  Location: Patient: Home Provider: Home   I discussed the limitations, risks, security and privacy concerns of performing an evaluation and management service by telephone and the availability of in person appointments. I also discussed with the patient that there may be a patient responsible charge related to this service. The patient expressed understanding and agreed to proceed.   History of Present Illness: Oncology History Overview Note  # DEC 2020- ADENO CA [s/p Pleural effusion]; [name]; [name]; abdominal ascites status post paracentesis x2; adenocarcinoma; [name]; omental caking/peritoneal disease/no obvious evidence of bowel involvement; no adnexal masses readily noted; Ca [phone].   #   # BRCA-1 [on screening; s/p genetics counseling; [name]; July 2019- 2-3cm-right complex ovarian cyst- likely benign/hemorrhagic [also 2011].  # # NGS/MOLECULAR TESTS:P    # PALLIATIVE CARE EVALUATION:P  # PAIN MANAGEMENT: NA   DIAGNOSIS: Primary peritoneal adenocarcinoma  STAGE:   IV      ;  GOALS: control  CURRENT/MOST RECENT THERAPY : CARBO-TAXOL-AVASTIN [C]    Cancer of bronchus of right upper lobe ([place]) (Resolved)  12/11/2019 Initial Diagnosis   Cancer of bronchus of right upper lobe ([facility])   Primary peritoneal carcinomatosis ([place])  12/16/2019 Initial Diagnosis   Primary peritoneal adenocarcinoma ([place])   12/23/2019 -  Chemotherapy   The patient had palonosetron (ALOXI) injection 0.25 mg, 0.25 mg, Intravenous,  Once, 1 of 6 cycles Administration: 0.25 mg (12/23/2019) pegfilgrastim-jmdb (FULPHILA) injection 6 mg, 6 mg,  Subcutaneous,  Once, 0 of 5 cycles CARBOplatin (PARAPLATIN) 900 mg in sodium chloride 0.9 % 500 mL chemo infusion, 900 mg (100 % of original dose 900 mg), Intravenous,  Once, 1 of 6 cycles Dose modification:   (original dose 900 mg, Cycle 1) Administration: 900 mg (12/23/2019) fosaprepitant (EMEND) 150 mg in sodium chloride 0.9 % 145 mL IVPB, 150 mg, Intravenous,  Once, 1 of 6 cycles Administration: 150 mg (12/23/2019) PACLitaxel (TAXOL) 456 mg in sodium chloride 0.9 % 500 mL chemo infusion (> 105m/m2), 200 mg/m2 = 456 mg, Intravenous,  Once, 1 of 6 cycles Administration: 456 mg (12/23/2019) bevacizumab-bvzr (ZIRABEV) 1,700 mg in sodium chloride 0.9 % 100 mL chemo infusion, 15 mg/kg = 1,700 mg, Intravenous,  Once, 0 of 5 cycles  for chemotherapy treatment.     Patient presents to symptom management today with above history with complaints of a painful knot on right wrist with intermittent numbness and tingling to right fingertips. Pain occasionaly radiates to right bicep. Symptoms began on Saturday and have been progressive.  Yesterday she noticed more intense and persistent pain.  She denies injury to right arm.  Has never experienced this before.  She is worried it is a blood clot.  She is currently not on blood thinners.  Was started on tramadol last week by Dr. BYevette Edwardsbut otherwise denies any new medications.  She denies any shortness of breath, chest pain, constipation or diarrhea.  She has been afebrile.  She was last evaluated by Dr. BYevette Edwardson 12/26/2019 after discharge from hospital (12/23/19).  She received first dose of carbo/Taxol on 12/23/2019.  Avastin held secondary to hospitalization but plan is to proceed for cycle 2.  She is BRCA positive.  History of  recurrent ascites and shortness of breath s/p several paracentesis. Most recently 3.1 L of blood-tinged fluid was removed on 12/26/19. Plan is for cycle 2 + avastin on 01/12/19.   Observations/Objective: Review of Systems   Constitutional: Negative.  Negative for chills, fever, malaise/fatigue and weight loss.  HENT: Negative for congestion, ear pain and tinnitus.   Eyes: Negative.  Negative for blurred vision and double vision.  Respiratory: Negative.  Negative for cough, [name], palpitations and leg swelling.  Gastrointestinal: Negative.  Negative for abdominal pain, constipation, diarrhea, nausea and vomiting.  Genitourinary: Negative for dysuria, frequency and urgency.  Musculoskeletal: Positive for joint pain (Right wrist ). Negative for back pain and falls.  Skin: Negative.  Negative for rash.  Neurological: Positive for tingling (Right fingertips). Negative for weakness and headaches.  Endo/Heme/Allergies: Negative.  Does not bruise/bleed easily.  Psychiatric/Behavioral: Negative for depression. The patient is nervous/anxious. The patient does not have insomnia.     Assessment and Plan: [name] is a 48 year old female who presents to symptom management for complaints of right wrist pain x2 days.  She is status post 1 cycle of carbo/Taxol which was given on 12/23/2019.  Avastin held secondary to hospitalization.  Has recurrent malignant ascites status post several paracentesis and thorcentesis.  May required scheduled paracentesis given frequency in the future.  She is scheduled to return to clinic in approximately 2 weeks for cycle 2 carbo/Taxol plus Avastin.  Right wrist pain: Unclear etiology.  She denies injury.  Cannot exclude blood clot given she is on active chemotherapy and she has cancer. R/o dvt.   Patient lives in [place] and would appreciate imaging close by.  I have arranged for her to have imaging at [place]:  Stat US venous upper extremity right. DG right wrist r/o fracture.  Schedule paracentesis-patient feels like she may need one soon.   Disposition: We will call  patient with results.  Addendum: Ultrasound of right upper extremity was positive for occlusive superficial venous thrombus involving the antecubital vein and the cephalic vein from the proximal forearm to the shoulder.  No evidence of deep venous thrombus.  Patient called and informed of results.  Unfortunately, patient does not have health insurance and was unable to pick up starter kit for anticoagulation.  I was able to reach the [facility] and they were able to give her a one-time dose of Lovenox 1 mg/kg.  She received 115 mg subcutaneous Lovenox.   I spoke with [facility] [place]; [facility] oral chemotherapy pharmacist who was able to get patient set up for medication assistance.  A voucher was called into [facility] for a 1 month supply of Xarelto 15 mg twice daily x3 weeks and 20 mg daily thereafter.  Follow Up Instructions: Return to [facility] today for Lovenox injection. Pick up Xarelto starter pack at [facility].  Voucher called in for free prescription. Patient will need to follow-up and fill out necessary paperwork for medication assistance at next visit with Dr. Yevette Edwards on 01/13/2020.   I discussed the assessment and treatment plan with the patient. The patient was provided an opportunity to ask questions and all were answered. The patient agreed with the plan and demonstrated an understanding of the instructions.   The patient was advised to call back or seek an in-person evaluation if the symptoms worsen or if the condition fails to improve as  anticipated.  I provided 45 minutes of non-face-to-face time during this encounter.  Jacquelin Hawking, NP

## 2019-12-30 NOTE — Telephone Encounter (Signed)
Patient called reporting that she has a sore place that came up on her right arm and now her hand and fingers are tingling. She is concerned that she may have a blood clot. Please advise

## 2019-12-30 NOTE — Telephone Encounter (Signed)
Patient accepts 1 PM virtual appointment

## 2019-12-30 NOTE — Progress Notes (Signed)
Printed script for fulphila. Given to [facility].  GB

## 2019-12-30 NOTE — Progress Notes (Signed)
Patient arrived at the clinic today at the request of Faythe Casa, NP.  She is normally a patient at [facility] and was here today in radiology for a ultrasound of her arm.  She was found to have a DVT of the right forearm.  Faythe Casa, NP gave orders for one time dose of Lovenox 1 mg/kg.  115 mg ordered.  Per pharmacist 120 mg to be given.  Patient was given lovenox per orders see [facility] for administration details.  Upon arrival she was ambulatory and very short of breath.  Her blood pressure was 107/82.  She was instructed to hold her blood pressure dose for tonight as she took the first dose only a few hours ago.  I told her to resume first thing in the morning.  She mentioned needing another thoracentesis but was going to try and wait until Friday when she has her port placed.  I advised her not to wait and she said" I don't think I can wait".  I advised to call the clinic first thing in the morning and have that arranged.  She was given instructions to follow up with [facility] for her continued care.  She was offered a wheelchair for discharge but refused.  She left ambulatory and in stable condition.

## 2019-12-30 NOTE — Patient Instructions (Signed)
[place] at [facility] Discharge Instructions  You were seen today at [facility].  You have been diagnosed with a blood clot.  You were sent her by Rulon Abide, NP.  She wants you to get your first injection here today at our clinic.  You will start the Eliquis tomorrow per her instructions.  You will follow up with the clinic at [facility] as scheduled.    Thank you for choosing [place] at [facility] to provide your oncology and hematology care.  To afford each patient quality time with our provider, please arrive at least 15 minutes before your scheduled appointment time.   If you have a lab appointment with the [facility] please come in thru the Main Entrance and check in at the main information desk.  You need to re-schedule your appointment should you arrive 10 or more minutes late.  We strive to give you quality time with our providers, and arriving late affects you and other patients whose appointments are after yours.  Also, if you no show three or more times for appointments you may be dismissed from the clinic at the providers discretion.     Again, thank you for choosing [facility].  Our hope is that these requests will decrease the amount of time that you wait before being seen by our physicians.       _____________________________________________________________  Should you have questions after your visit to [facility], please contact our office at [phone] between the hours of 8:00 a.m. and 4:30 p.m.  Voicemails left after 4:00 p.m. will not be returned until the following business day.  For prescription refill requests, have your pharmacy contact our office and allow 72 hours.    Due to Covid, you will need to wear a mask upon entering the hospital. If you do not have a mask, a mask will be given to you at the Main Entrance upon arrival. For doctor visits, patients may have 1 support  person with them. For treatment visits, patients can not have anyone with them due to social distancing guidelines and our immunocompromised population.

## 2019-12-30 NOTE — Telephone Encounter (Signed)
Could we schedule her for a virtual visit with Sonia Baller today? Thanks!

## 2019-12-31 ENCOUNTER — Encounter (HOSPITAL_COMMUNITY): Payer: Self-pay

## 2019-12-31 ENCOUNTER — Telehealth: Payer: Self-pay | Admitting: *Deleted

## 2019-12-31 ENCOUNTER — Ambulatory Visit (HOSPITAL_COMMUNITY)
Admission: RE | Admit: 2019-12-31 | Discharge: 2019-12-31 | Disposition: A | Discharge status: Home / Self Care | Payer: Medicaid Other | Source: Ambulatory Visit | Attending: Oncology | Admitting: Oncology

## 2019-12-31 DIAGNOSIS — C801 Malignant (primary) neoplasm, unspecified: Secondary | ICD-10-CM | POA: Insufficient documentation

## 2019-12-31 DIAGNOSIS — R180 Malignant ascites: Secondary | ICD-10-CM | POA: Diagnosis not present

## 2019-12-31 MED ORDER — LIDOCAINE HCL (PF) 1 % IJ SOLN
INTRAMUSCULAR | Status: AC
  Filled 2019-12-31: qty 5

## 2019-12-31 NOTE — Telephone Encounter (Signed)
Spoke with Dr. Rogue Bussing  - per md - md would like to hold off on port placement given the new DVT/anticoagulation.  Left vm for patient regarding the above. Requested patient to return my phone call. Colette also left vm re: time of [place] para today at 1045 am. Will also send my chart msg - requesting patient to contact me regarding the above.

## 2019-12-31 NOTE — Telephone Encounter (Signed)
Pt returned my phone call. Spoke with patient. She confirmed apt with [place] today for [place] para. She understands that port placement is on hold/cnl.

## 2019-12-31 NOTE — Telephone Encounter (Signed)
Patient requesting paracentesis to be performed today at [facility] if possible.  Pt has a port placement this Friday. She does not want to wait until then to have para. [facility] is closer for patient's transportation.  Spoke with Sonia Baller NP- who agreed to enter orders for [place] para.   Pt is aware that her [place] of her upper extremity was positive for DVT. She understands that a Network engineer for xarelto at [facility] in [place].  Dr. B given pt is having a port placed on Friday - please advise re: coags.

## 2019-12-31 NOTE — Procedures (Signed)
PreOperative Dx: Malignant ascites Postoperative Dx: Malignant ascites Procedure:   US guided paracentesis Radiologist:  Thornton Papas Anesthesia:  12 ml of1% lidocaine Specimen:  4 L of yellow ascitic fluid EBL:   < 1 ml Complications: None

## 2019-12-31 NOTE — Progress Notes (Signed)
Paracentesis complete no signs of distress.  

## 2019-12-31 NOTE — Telephone Encounter (Signed)
Contacted Barbara in speciality scheduling to cnl the port placement on Friday.

## 2020-01-02 ENCOUNTER — Telehealth: Payer: Self-pay | Admitting: Internal Medicine

## 2020-01-02 ENCOUNTER — Other Ambulatory Visit: Payer: Self-pay | Admitting: Oncology

## 2020-01-02 ENCOUNTER — Telehealth: Payer: Self-pay | Admitting: *Deleted

## 2020-01-02 DIAGNOSIS — R180 Malignant ascites: Secondary | ICD-10-CM

## 2020-01-02 NOTE — Telephone Encounter (Signed)
Contacted [name] services [phone]) to obtain information- on out of pocket cost for self - pay. Customer services stated that 1 case is $590 each. This case would most likely only last patient 1-2 weeks based on her current drainage via paracentesis. Patient is self pay. The manufacture (at [phone]) may could direct for any available patient assistance programs for supplies. I personally dicussed the above concerns with Sonia Baller, NP and Dr. Rogue Bussing. Team made aware that patient has applied for [place] Medicaid. If patient is medicaid eligible, [place] medicaid would have a preferred agency for supplies; however, the out of pocket costs would be lower with  medicaid.  The phone number for [place]-  Phone: [phone]

## 2020-01-02 NOTE — Telephone Encounter (Signed)
On 12/06-Spoke to patient regarding re: New diagnosis of left upper extremity DVT.  Patient on Xarelto.  Tolerating well.  Paracentesis done recently; discussed that it could take up to 2-3 cycles for improvement of her clinical symptoms.  Hold of the port given the new DVT.   Follow-up as planned. GB

## 2020-01-02 NOTE — Telephone Encounter (Signed)
Patient called requesting to speak with Rulon Abide, NP. She states she has fluid build up again and was just drained on Tuesday. Please return her call [phone]

## 2020-01-02 NOTE — Progress Notes (Signed)
Re: Recurrent ascites  Patient called clinic today concerned about rapid recurrent ascites.  Last paracentesis was on Tuesday where 4 L of fluid was removed.  She currently states she is uncomfortable and feels that the fluid is returning "faster than usual". Endorses shortness of breath and insomnia.  She was recently diagnosed with an occlusive superficial blood clot to right arm.  She was started on Xarelto.  She is scheduled to return for her next cycle of chemotherapy on 01/13/2020.  Given she is requiring frequent paracentesis, will look into having Pleurx placed.  Unfortunately, recent blood clot complicates things.  In the meantime, we will get her scheduled for a repeat paracentesis with albumin replacement 6 g/L per Dr. Yevette Edwards.   Faythe Casa, NP 01/02/2020 10:20 AM

## 2020-01-02 NOTE — Telephone Encounter (Signed)
Ok I will call her.   Thanks,   Faythe Casa, NP 01/02/2020 9:23 AM

## 2020-01-03 ENCOUNTER — Ambulatory Visit (HOSPITAL_COMMUNITY)
Admission: RE | Admit: 2020-01-03 | Discharge: 2020-01-03 | Disposition: A | Discharge status: Home / Self Care | Payer: Medicaid Other | Source: Ambulatory Visit | Attending: Oncology | Admitting: Oncology

## 2020-01-03 ENCOUNTER — Ambulatory Visit: Payer: Self-pay

## 2020-01-03 ENCOUNTER — Other Ambulatory Visit: Payer: Self-pay

## 2020-01-03 DIAGNOSIS — R180 Malignant ascites: Secondary | ICD-10-CM | POA: Diagnosis not present

## 2020-01-03 DIAGNOSIS — C801 Malignant (primary) neoplasm, unspecified: Secondary | ICD-10-CM | POA: Insufficient documentation

## 2020-01-03 DIAGNOSIS — R188 Other ascites: Secondary | ICD-10-CM | POA: Diagnosis not present

## 2020-01-03 LAB — FUNGUS CULTURE WITH STAIN

## 2020-01-03 LAB — FUNGAL ORGANISM REFLEX

## 2020-01-03 LAB — FUNGUS CULTURE RESULT

## 2020-01-03 NOTE — Progress Notes (Signed)
Patient presents for US paracentesis. No complaints.   Procedure started 1030 Procedure ended 5258 Total volume removed 3400 mls   Pt tolerated procedure well and without complications. No distress or complaints at time of discharge.

## 2020-01-03 NOTE — Procedures (Signed)
PreOperative Dx: Malignant ascites Postoperative Dx: Malignant ascites Procedure:   US guided paracentesis Radiologist:  Thornton Papas Anesthesia:  10 ml of1% lidocaine Specimen:  3.4 L of cloudy yellow ascitic fluid EBL:   < 1 ml Complications: None

## 2020-01-06 ENCOUNTER — Inpatient Hospital Stay: Payer: Medicaid Other

## 2020-01-06 NOTE — Progress Notes (Signed)
Nutrition Assessment   Reason for Assessment:   Referral for decreased appetite.    ASSESSMENT:  49 year old female with peritoneal adenocarcinoma (likely gynecologic origin).  Past medical history of depression.  Patient receiving chemotherapy.  Patient requiring paracentesis due to malignant ascites.    Spoke with patient via phone for nutrition assessment.  Unable to come to clinic this pm for face to face visit.  Patient reports that appetite is up and down. Typically has more appetite following paracentesis.  Does have issues with nausea.  Reports for breakfast may have ensure or cereal then tends to snack during the day.  Likes to snack on fruit, yogurt, cheese, pickles, 1/2 [place] sandwich.  Reports she received magic bullet for Christmas and has been making smoothies (fruit, yogurt, 2% milk).    Medications: MVI, prilosec, zofran, compazine, b complex   Labs: reviewed   Anthropometrics:   Height: 63 inches Weight: 255 lb 12/31 UBW: 250 lb per patient before diagnosis Dec 2020 BMI: 45  Weight fluctuates with fluid accumulation   NUTRITION DIAGNOSIS: Inadequate oral intake related to cancer, ascites as evidenced by poor appetite, eating small amounts at a time   INTERVENTION:  Encouraged being proactive with taking nausea medications to help symptom.  Encouraged small frequent mini snacks/meals q 2 hours. Will mail recipes for smoothies along with ensure coupons.  Encouraged intake of ensure for added calories.  Encouraged adding protein food at every snack/mini meal.   Contact information provided   MONITORING, EVALUATION, GOAL: Patient will consume adequate calories and protein to maintain muscle mass   Next Visit: Feb 8 phone f/u  [name] B. Zenia Resides, [place], [place] Registered Dietitian [phone] (pager)

## 2020-01-07 ENCOUNTER — Telehealth: Payer: Self-pay | Admitting: Licensed Clinical Social Worker

## 2020-01-07 DIAGNOSIS — R180 Malignant ascites: Secondary | ICD-10-CM

## 2020-01-07 DIAGNOSIS — C482 Malignant neoplasm of peritoneum, unspecified: Secondary | ICD-10-CM

## 2020-01-07 NOTE — Telephone Encounter (Signed)
Patient called stating that she will need paracentesis again on this Thursday if possible because she has transportation. She would like this to be done at [facility].

## 2020-01-08 ENCOUNTER — Other Ambulatory Visit: Payer: Self-pay | Admitting: Oncology

## 2020-01-08 DIAGNOSIS — R180 Malignant ascites: Secondary | ICD-10-CM

## 2020-01-08 MED ORDER — ALBUMIN HUMAN 25 % IV SOLN: 18.0000 g | Freq: Once | INTRAVENOUS | Status: DC

## 2020-01-08 NOTE — Telephone Encounter (Signed)
H/T- it is ok from my side to have paracentesis; no labs off the fluid. Add albumin 6 gm with each liter of fluid removed. Please order at [facility].  GB

## 2020-01-08 NOTE — Telephone Encounter (Signed)
Orders placed for albumin.   6 grams/liter removed.   Faythe Casa, NP 01/08/2020 9:26 AM

## 2020-01-08 NOTE — Telephone Encounter (Signed)
C- please schedule u/s paracentesis today at [facility]. She will need replacement albumin.

## 2020-01-08 NOTE — Progress Notes (Signed)
Orders placed for Albumin 6 grams/per liter of fluids removed.   Patient to have paracentesis today at [facility].   Faythe Casa, NP 01/08/2020 9:26 AM

## 2020-01-09 ENCOUNTER — Ambulatory Visit (HOSPITAL_COMMUNITY)
Admission: RE | Admit: 2020-01-09 | Discharge: 2020-01-09 | Disposition: A | Discharge status: Home / Self Care | Payer: Medicaid Other | Source: Ambulatory Visit | Attending: Internal Medicine | Admitting: Internal Medicine

## 2020-01-09 ENCOUNTER — Other Ambulatory Visit: Payer: Self-pay

## 2020-01-09 ENCOUNTER — Encounter: Payer: Self-pay | Admitting: Pharmacy Technician

## 2020-01-09 ENCOUNTER — Encounter (HOSPITAL_COMMUNITY): Payer: Self-pay

## 2020-01-09 DIAGNOSIS — R180 Malignant ascites: Secondary | ICD-10-CM | POA: Diagnosis not present

## 2020-01-09 DIAGNOSIS — C801 Malignant (primary) neoplasm, unspecified: Secondary | ICD-10-CM | POA: Diagnosis not present

## 2020-01-09 DIAGNOSIS — C482 Malignant neoplasm of peritoneum, unspecified: Secondary | ICD-10-CM | POA: Diagnosis not present

## 2020-01-09 NOTE — Progress Notes (Signed)
Patient has been approved for drug assistance by [employer] for Fulphila. The enrollment period is from 01/06/20-01/05/21 based on self pay. First DOS covered is 01/15/20.

## 2020-01-09 NOTE — Procedures (Signed)
PreOperative Dx: Malignant ascites Postoperative Dx: Malignant ascites Procedure:   US guided paracentesis Radiologist:  Thornton Papas Anesthesia:  10 ml of1% lidocaine Specimen:  2.7 L of amber ascitic fluid EBL:   < 1 ml Complications: None

## 2020-01-10 ENCOUNTER — Other Ambulatory Visit: Payer: Self-pay

## 2020-01-10 ENCOUNTER — Ambulatory Visit (HOSPITAL_COMMUNITY): Payer: Self-pay

## 2020-01-13 ENCOUNTER — Encounter: Payer: Self-pay | Admitting: Internal Medicine

## 2020-01-13 ENCOUNTER — Other Ambulatory Visit: Payer: Self-pay

## 2020-01-13 ENCOUNTER — Inpatient Hospital Stay: Payer: Medicaid Other

## 2020-01-13 ENCOUNTER — Inpatient Hospital Stay (HOSPITAL_BASED_OUTPATIENT_CLINIC_OR_DEPARTMENT_OTHER): Payer: Medicaid Other | Admitting: Internal Medicine

## 2020-01-13 VITALS — BP 100/69 | HR 72

## 2020-01-13 DIAGNOSIS — C482 Malignant neoplasm of peritoneum, unspecified: Secondary | ICD-10-CM

## 2020-01-13 DIAGNOSIS — Z7189 Other specified counseling: Secondary | ICD-10-CM

## 2020-01-13 DIAGNOSIS — R180 Malignant ascites: Secondary | ICD-10-CM

## 2020-01-13 DIAGNOSIS — Z5112 Encounter for antineoplastic immunotherapy: Secondary | ICD-10-CM | POA: Diagnosis not present

## 2020-01-13 DIAGNOSIS — Z5111 Encounter for antineoplastic chemotherapy: Secondary | ICD-10-CM | POA: Diagnosis not present

## 2020-01-13 DIAGNOSIS — I82611 Acute embolism and thrombosis of superficial veins of right upper extremity: Secondary | ICD-10-CM | POA: Diagnosis not present

## 2020-01-13 DIAGNOSIS — Z95828 Presence of other vascular implants and grafts: Secondary | ICD-10-CM

## 2020-01-13 LAB — CBC WITH DIFFERENTIAL/PLATELET
Abs Immature Granulocytes: 0.03 10*3/uL (ref 0.00–0.07)
Basophils Absolute: 0.1 10*3/uL (ref 0.0–0.1)
Basophils Relative: 1 %
Eosinophils Absolute: 0 10*3/uL (ref 0.0–0.5)
Eosinophils Relative: 1 %
HCT: 43.2 % (ref 36.0–46.0)
Hemoglobin: 13.5 g/dL (ref 12.0–15.0)
Immature Granulocytes: 1 %
Lymphocytes Relative: 29 %
Lymphs Abs: 1.4 10*3/uL (ref 0.7–4.0)
MCH: 28.8 pg (ref 26.0–34.0)
MCHC: 31.3 g/dL (ref 30.0–36.0)
MCV: 92.1 fL (ref 80.0–100.0)
Monocytes Absolute: 0.4 10*3/uL (ref 0.1–1.0)
Monocytes Relative: 9 %
Neutro Abs: 2.9 10*3/uL (ref 1.7–7.7)
Neutrophils Relative %: 59 %
Platelets: 168 10*3/uL (ref 150–400)
RBC: 4.69 MIL/uL (ref 3.87–5.11)
RDW: 13.8 % (ref 11.5–15.5)
WBC: 4.8 10*3/uL (ref 4.0–10.5)
nRBC: 0 % (ref 0.0–0.2)

## 2020-01-13 LAB — COMPREHENSIVE METABOLIC PANEL
ALT: 12 U/L (ref 0–44)
AST: 18 U/L (ref 15–41)
Albumin: 2.6 g/dL — ABNORMAL LOW (ref 3.5–5.0)
Alkaline Phosphatase: 59 U/L (ref 38–126)
Anion gap: 8 (ref 5–15)
BUN: 7 mg/dL (ref 6–20)
CO2: 23 mmol/L (ref 22–32)
Calcium: 8.4 mg/dL — ABNORMAL LOW (ref 8.9–10.3)
Chloride: 106 mmol/L (ref 98–111)
Creatinine, Ser: 0.66 mg/dL (ref 0.44–1.00)
GFR calc Af Amer: >60 mL/min (ref >60)
GFR calc non Af Amer: >60 mL/min (ref >60)
Glucose, Bld: 119 mg/dL — ABNORMAL HIGH (ref 70–99)
Potassium: 3.9 mmol/L (ref 3.5–5.1)
Sodium: 137 mmol/L (ref 135–145)
Total Bilirubin: 0.4 mg/dL (ref 0.3–1.2)
Total Protein: 6.2 g/dL — ABNORMAL LOW (ref 6.5–8.1)

## 2020-01-13 MED ORDER — SODIUM CHLORIDE 0.9 % IV SOLN
14.5000 mg/kg | Freq: Once | INTRAVENOUS | Status: AC
  Administered 2020-01-13: 12:00:00 1700 mg via INTRAVENOUS
  Filled 2020-01-13: qty 64

## 2020-01-13 MED ORDER — SODIUM CHLORIDE 0.9% FLUSH
10.0000 mL | Freq: Once | INTRAVENOUS | Status: AC
  Filled 2020-01-13: qty 10

## 2020-01-13 MED ORDER — FAMOTIDINE IN NACL 20-0.9 MG/50ML-% IV SOLN
20.0000 mg | Freq: Once | INTRAVENOUS | Status: AC
  Administered 2020-01-13: 11:00:00 20 mg via INTRAVENOUS
  Filled 2020-01-13: qty 50

## 2020-01-13 MED ORDER — SODIUM CHLORIDE 0.9 % IV SOLN
Freq: Once | INTRAVENOUS | Status: AC
  Filled 2020-01-13: qty 250

## 2020-01-13 MED ORDER — SODIUM CHLORIDE 0.9 % IV SOLN
10.0000 mg | Freq: Once | INTRAVENOUS | Status: AC
  Administered 2020-01-13: 10 mg via INTRAVENOUS
  Filled 2020-01-13: qty 10

## 2020-01-13 MED ORDER — DIPHENHYDRAMINE HCL 50 MG/ML IJ SOLN
50.0000 mg | Freq: Once | INTRAMUSCULAR | Status: AC
  Administered 2020-01-13: 50 mg via INTRAVENOUS
  Filled 2020-01-13: qty 1

## 2020-01-13 MED ORDER — SODIUM CHLORIDE 0.9 % IV SOLN
200.0000 mg/m2 | Freq: Once | INTRAVENOUS | Status: AC
  Administered 2020-01-13: 13:00:00 456 mg via INTRAVENOUS
  Filled 2020-01-13: qty 76

## 2020-01-13 MED ORDER — SODIUM CHLORIDE 0.9 % IV SOLN
150.0000 mg | Freq: Once | INTRAVENOUS | Status: AC
  Administered 2020-01-13: 11:00:00 150 mg via INTRAVENOUS
  Filled 2020-01-13: qty 5

## 2020-01-13 MED ORDER — PALONOSETRON HCL INJECTION 0.25 MG/5ML
0.2500 mg | Freq: Once | INTRAVENOUS | Status: AC
  Administered 2020-01-13: 0.25 mg via INTRAVENOUS
  Filled 2020-01-13: qty 5

## 2020-01-13 MED ORDER — HEPARIN SOD (PORK) LOCK FLUSH 100 UNIT/ML IV SOLN
INTRAVENOUS | Status: AC
  Filled 2020-01-13: qty 5

## 2020-01-13 MED ORDER — SODIUM CHLORIDE 0.9 % IV SOLN
900.0000 mg | Freq: Once | INTRAVENOUS | Status: AC
  Administered 2020-01-13: 16:00:00 900 mg via INTRAVENOUS
  Filled 2020-01-13: qty 90

## 2020-01-13 NOTE — Progress Notes (Signed)
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 of approximately 5.1 L of dark yellow fluid was removed. IMPRESSION: Successful ultrasound-guided paracentesis yielding 5.1 L liters of peritoneal fluid. Electronically Signed   By: Lucrezia Europe M.D.   On: 12/17/2019 11:30   DG Chest Port 1 View  Result Date: 12/23/2019 CLINICAL DATA:  Status post right thoracentesis today. EXAM: PORTABLE CHEST 1 VIEW COMPARISON:  PA and lateral chest 12/21/2019. FINDINGS: Right pleural effusion is decreased after thoracentesis. No pneumothorax. Trace left effusion noted. Lungs clear. Heart size normal. No acute or focal bony abnormality. IMPRESSION: Decreased right pleural effusion after thoracentesis. No pneumothorax. Trace left pleural effusion. Electronically Signed   By: Inge Rise M.D.   On: 12/23/2019 11:20   US THORACENTESIS ASP PLEURAL SPACE W/IMG GUIDE  Result Date: 12/23/2019 INDICATION: Shortness of breath. Cough. Right pleural effusion. Diagnostic thoracentesis requested. EXAM: ULTRASOUND GUIDED RIGHT THORACENTESIS MEDICATIONS: None. COMPLICATIONS: None  immediate. PROCEDURE: An ultrasound guided thoracentesis was thoroughly discussed with the patient and questions answered. The benefits, risks, alternatives and complications were also discussed. The patient understands and wishes to proceed with the procedure. Written consent was obtained. Ultrasound was performed to localize  and mark an adequate pocket of fluid in the right chest. The area was then prepped and draped in the normal sterile fashion. 1% Lidocaine was used for local anesthesia. Under ultrasound guidance a 6 Fr Safe-T-Centesis catheter was introduced. Thoracentesis was performed. The catheter was removed and a dressing applied. FINDINGS: A total of approximately 650 mL of amber colored fluid was removed. Samples were sent to the laboratory as requested by the clinical team. IMPRESSION: Successful ultrasound guided right thoracentesis yielding 650 mL of pleural fluid. 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 Currently on neoadjuvant chemotherapy carbotaxol; add bev with cycle #2 today.   # proceed with carbo-taxol-Bev cycle #2. Labs today reviewed;  acceptable for treatment today.   #Malignant ascites-s/p mu triple paracentesis.  Monitor closely-discussed that with chemotherapy-hopefully will note some response soon.  #Abdominal pain/discomfort likely secondary to malignant ascites improving; tramdaol prn.    #Left upper extremity DVT-second underlying malignancy.  Recommend continue Xarelto.  #Malnutrition low albumin-evaluation with Almy[facility] Free.   # DISPOSITION:   # carbo-taxol-Bev today # in 3 weeks- MD; labs-Cbcc/cmp/ca-125;UA; carbo-taxol-bev- Dr.B     Cammie Sickle, MD 01/15/2020 8:21 AM

## 2020-01-13 NOTE — Assessment & Plan Note (Addendum)
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 Currently on neoadjuvant chemotherapy carbotaxol; add bev with cycle #2 today.   # proceed with carbo-taxol-Bev cycle #2. Labs today reviewed;  acceptable for treatment today.   #Malignant ascites-s/p mu triple paracentesis.  Monitor closely-discussed that with chemotherapy-hopefully will note some response soon.  #Abdominal pain/discomfort likely secondary to malignant ascites improving; tramdaol prn.    #Left upper extremity DVT-second underlying malignancy.  Recommend continue Xarelto.  #Malnutrition low albumin-evaluation with Almyra Free.   # DISPOSITION:   # carbo-taxol-Bev today # in 3 weeks- MD; labs-Cbcc/cmp/ca-125;UA; carbo-taxol-bev- Dr.B  #Addendum-Ca1 2 5 improving  ~5000/patient informed.

## 2020-01-14 ENCOUNTER — Telehealth: Payer: Self-pay | Admitting: Pharmacy Technician

## 2020-01-14 ENCOUNTER — Encounter: Payer: Self-pay | Admitting: Internal Medicine

## 2020-01-14 LAB — CA 125: Cancer Antigen (CA) 125: 5855 U/mL — ABNORMAL HIGH (ref 0.0–38.1)

## 2020-01-16 NOTE — Telephone Encounter (Addendum)
Oral Oncology Patient Advocate Encounter  Met patient in room to complete application for [facility] and [facility] in an effort to reduce patient's out of pocket expense for Xarelto to $0.    Application completed and faxed to [phone] on 01/14/20.   [name] patient assistance phone number for follow up is [phone].   This encounter will be updated until final determination.  [place] [facility] [place] Phone [phone] Fax [phone]

## 2020-01-16 NOTE — Telephone Encounter (Signed)
Oral Oncology Patient Advocate Encounter  Received notification from [employer] [facility] that patient has been successfully enrolled into their program to receive Xarelto from the manufacturer at $0 out of pocket until 01/14/2021.   [name] will send the patient a copay card that she can use at her pharmacy to receive Xarelto.    Pharmacy billing information is as followsKara Dies:  [id number] ID:  [id number] Group:  [id number]   I called and spoke with patient. She knows we will have to re-apply.   Patient knows to call the office with questions or concerns.   Oral Oncology Clinic will continue to follow.  [name] [facility] [place] Phone [phone] Fax [phone] 01/16/2020 8:30 AM

## 2020-01-17 ENCOUNTER — Other Ambulatory Visit: Payer: Self-pay | Admitting: Nurse Practitioner

## 2020-01-17 ENCOUNTER — Encounter: Payer: Self-pay | Admitting: Internal Medicine

## 2020-01-17 ENCOUNTER — Other Ambulatory Visit: Payer: Self-pay | Admitting: *Deleted

## 2020-01-17 ENCOUNTER — Telehealth: Payer: Self-pay | Admitting: Nurse Practitioner

## 2020-01-17 MED ORDER — OXYCODONE HCL 5 MG PO TABS: 5.0000 mg | ORAL_TABLET | Freq: Four times a day (QID) | ORAL | 0 refills | Status: DC | PRN

## 2020-01-17 MED ORDER — ALPRAZOLAM 0.25 MG PO TABS: 0.2500 mg | ORAL_TABLET | Freq: Two times a day (BID) | ORAL | 0 refills | Status: DC | PRN

## 2020-01-17 NOTE — Progress Notes (Signed)
Patient returned call. She continues to have abdominal pain unrelieved by tramadol. Fluid seems better. Will send prescription for oxycodone which she has taken in the past and tolerated well. Discussed alternating with tylenol. Discussed risks of opioid induced constipation and to use miralax and senna to achieve regular bms. Advised not to drive when taking pain medication. Will also set her up to see [facility], palliative care for ongoing symptom management next time she is at cancer center.

## 2020-01-17 NOTE — Telephone Encounter (Signed)
Called patient to f/u regarding myself message/pain. No answer. Left message to return call.

## 2020-01-22 ENCOUNTER — Other Ambulatory Visit: Payer: Self-pay | Admitting: Oncology

## 2020-01-22 ENCOUNTER — Encounter: Payer: Self-pay | Admitting: Internal Medicine

## 2020-01-22 MED ORDER — RIVAROXABAN 20 MG PO TABS: 20.0000 mg | ORAL_TABLET | Freq: Every day | ORAL | 6 refills | Status: DC

## 2020-01-23 ENCOUNTER — Encounter: Payer: Self-pay | Admitting: *Deleted

## 2020-01-27 ENCOUNTER — Telehealth: Payer: Self-pay | Admitting: *Deleted

## 2020-01-27 ENCOUNTER — Telehealth: Payer: Self-pay | Admitting: Oncology

## 2020-01-27 ENCOUNTER — Inpatient Hospital Stay: Payer: Medicaid Other | Attending: Oncology | Admitting: Oncology

## 2020-01-27 DIAGNOSIS — I82622 Acute embolism and thrombosis of deep veins of left upper extremity: Secondary | ICD-10-CM | POA: Insufficient documentation

## 2020-01-27 DIAGNOSIS — C482 Malignant neoplasm of peritoneum, unspecified: Secondary | ICD-10-CM | POA: Diagnosis not present

## 2020-01-27 DIAGNOSIS — Z5112 Encounter for antineoplastic immunotherapy: Secondary | ICD-10-CM | POA: Insufficient documentation

## 2020-01-27 DIAGNOSIS — K068 Other specified disorders of gingiva and edentulous alveolar ridge: Secondary | ICD-10-CM | POA: Diagnosis not present

## 2020-01-27 DIAGNOSIS — Z5111 Encounter for antineoplastic chemotherapy: Secondary | ICD-10-CM | POA: Insufficient documentation

## 2020-01-27 DIAGNOSIS — R519 Headache, unspecified: Secondary | ICD-10-CM | POA: Diagnosis not present

## 2020-01-27 DIAGNOSIS — F17210 Nicotine dependence, cigarettes, uncomplicated: Secondary | ICD-10-CM | POA: Insufficient documentation

## 2020-01-27 DIAGNOSIS — R180 Malignant ascites: Secondary | ICD-10-CM | POA: Insufficient documentation

## 2020-01-27 DIAGNOSIS — Z5189 Encounter for other specified aftercare: Secondary | ICD-10-CM | POA: Insufficient documentation

## 2020-01-27 DIAGNOSIS — Z7901 Long term (current) use of anticoagulants: Secondary | ICD-10-CM | POA: Insufficient documentation

## 2020-01-27 MED ORDER — MAGIC MOUTHWASH: 5.0000 mL | Freq: Four times a day (QID) | ORAL | 0 refills | Status: DC

## 2020-01-27 NOTE — Telephone Encounter (Signed)
[facility] called and states that cannot compound the Magic Mouth Wash prescription sent and that you could send it to [place] to be filled

## 2020-01-27 NOTE — Telephone Encounter (Signed)
Re: Bleeding Gums and headaches  Received message that patient was experiencing bleeding gums and headaches over the weekend which did not appear to be bad enough for ED evaluation.  Called and left voicemail on her cell phone for her to call me back to discuss her symptoms and possible evaluation in our symptom management clinic.  Will await for return phone call and address concerns at that time.  Faythe Casa, NP 01/27/2020 9:32 AM

## 2020-01-27 NOTE — Progress Notes (Signed)
Virtual Visit via Video Note  I connected with [name] on 01/29/20 at 11:00 AM EST by a video enabled telemedicine application and verified that I am speaking with the correct person using two identifiers.  Location: Patient: Home Provider: Home   I discussed the limitations of evaluation and management by telemedicine and the availability of in person appointments. The patient expressed understanding and agreed to proceed.  History of Present Illness: [name] is a 49 year old female who presents to symptom management today for complaints of headache and mouth pain.  She has past medical history positive for primary peritoneal carcinomatosis currently status post 2 cycles of carbotaxol, one while in the hospital. Mvasi added for cycle 2. She was diagnosed with a left upper extremity DVT and is stable on Xarelto.  She has required several paracentesis (12/23/19, 12/26/19, 12/31/19, 01/03/20 and 01/09/20).  She is scheduled to complete cycle 3 next week.  Observations/Objective: Today, patient states that she developed mouth, throat pain and persistent headaches since Saturday. Her last chemo was 01/13/20. She has tried tylenol 500 mg tablets without relief. She feels like she is "overdosing" on tylenol. States her mouth is sore and swollen and the left side of her throat hurts when she swallows. She denies any fevers. She denies any visible sores in her mouth or white plaque. States she tries to keep her mouth clean and hydrated but she has gum bleeding when she brushes her teeth. She has very little appetite but is able to keep down soft foods and warm liquids. She denies nausea or vomiting. She had a BM yesterday. No urinary concerns.   Review of Systems  Constitutional: Negative.  Negative for chills, fever, malaise/fatigue and weight loss.  HENT: Positive for sore throat. Negative for congestion, ear pain and tinnitus.        Mouth pain/ gum bleeding  Eyes: Negative.  Negative for  blurred vision and double vision.  Respiratory: Negative.  Negative for cough, [name], palpitations and leg swelling.  Gastrointestinal: Positive for abdominal pain (chronic). Negative for constipation, diarrhea, nausea and vomiting.  Genitourinary: Negative for dysuria, frequency and urgency.  Musculoskeletal: Negative for back pain and falls.  Skin: Negative.  Negative for rash.  Neurological: Positive for weakness and headaches.  Endo/Heme/Allergies: Negative.  Does not bruise/bleed easily.  Psychiatric/Behavioral: Negative.  Negative for depression. The patient is not nervous/anxious and does not have insomnia.    Assessment and Plan:  Peritoneal carcinomatosis: Status post 2 cycles of carbotaxol with the addition of Mvasi with cycle 2.  Has required multiple paracentesis but is becoming less frequent. Scheduled to return for cycle 3 next week.  Mouth and throat pain: Likely due to chemotherapy.  According to up-to-date, mvasi can cause stomatitis with incidence between 15 and 25% and oropharyngeal pain at 16%. Can try magic mouthwash to see if this relieves the pain. No obvious leison but significant sensitivity.     Gingival bleeding: Multifactorial. Minimal. With brushing only. Mvasi can cause bleeding including minor grade 1 to serious hemmorhage. Monitor. She is also on Xarelto for new upper extremity dvt.   Headaches: Unable to get blood pressure reading today d/t virtual visit. I have asked that she have her BP taken when she picks up her new prescriptions today at the pharmacy and to call me back with readings. Mvasi has high incidence of hypertension (24-42%) and headaches (22-49%). Can continue tylenol but I  have asked that she start taking tramadol 50 mg q8 prn or oxycodone 5 mg q 6 prn. She is concerned that it may make her constipated. Recommend Senna BID.   Plan: RX Magic mouthwash sent to  [facility]. Start taking tramadol or Oxycodone.  Continue tylenol prn.  Continue bowel prophylaxis with Senna.  Increase hydration.   Follow Up Instructions: Disposition: Call clinic with bp readings.  RTC as scheduled on 01/30/2020 to see [facility]. RTC on 02/03/2020 to see Dr. Rogue Bussing for labs and consideration of Cycle 3 carbo/taxol + Mvasi.   I discussed the assessment and treatment plan with the patient. The patient was provided an opportunity to ask questions and all were answered. The patient agreed with the plan and demonstrated an understanding of the instructions.   The patient was advised to call back or seek an in-person evaluation if the symptoms worsen or if the condition fails to improve as anticipated.  I provided 25 minutes of non-face-to-face time during this encounter.   Jacquelin Hawking, NP  Addendum: BP stable- 121/82

## 2020-01-27 NOTE — Telephone Encounter (Signed)
I just messaged [name] to re-send to [facility]

## 2020-01-30 ENCOUNTER — Inpatient Hospital Stay (HOSPITAL_BASED_OUTPATIENT_CLINIC_OR_DEPARTMENT_OTHER): Payer: Medicaid Other | Admitting: Hospice and Palliative Medicine

## 2020-01-30 DIAGNOSIS — C482 Malignant neoplasm of peritoneum, unspecified: Secondary | ICD-10-CM

## 2020-01-30 DIAGNOSIS — G4700 Insomnia, unspecified: Secondary | ICD-10-CM | POA: Diagnosis not present

## 2020-01-30 DIAGNOSIS — Z515 Encounter for palliative care: Secondary | ICD-10-CM | POA: Diagnosis not present

## 2020-01-30 DIAGNOSIS — G893 Neoplasm related pain (acute) (chronic): Secondary | ICD-10-CM | POA: Diagnosis not present

## 2020-01-30 MED ORDER — OXYCODONE HCL 5 MG PO TABS: 5.0000 mg | ORAL_TABLET | ORAL | 0 refills | Status: DC | PRN

## 2020-01-30 MED ORDER — DEXAMETHASONE 4 MG PO TABS: 4.0000 mg | ORAL_TABLET | Freq: Every day | ORAL | 0 refills | Status: DC

## 2020-01-30 NOTE — Progress Notes (Signed)
Virtual Visit via Telephone Note  I connected with [name] on 01/30/20 at  9:30 AM EST by telephone and verified that I am speaking with the correct person using two identifiers.   I discussed the limitations, risks, security and privacy concerns of performing an evaluation and management service by telephone and the availability of in person appointments. I also discussed with the patient that there may be a patient responsible charge related to this service. The patient expressed understanding and agreed to proceed.   History of Present Illness: [name] is a 49 year old woman with multiple medical problems including stage IV primary peritoneal carcinomatosis currently on treatment carboplatin/paclitaxel/Mvasi.  She has had recurrent malignant ascites requiring frequent large-volume paracentesis.  Patient has also had pain and recent oral mucositis.  She was referred to palliative care to help address goals and manage ongoing symptoms.  Patient is unmarried.  She lives at home with her 57 old son.  She has 2 sons and 2 daughters.  Patient previously worked as a Occupational psychologist at [employer].  She is currently disabled.   Observations/Objective: I called and spoke with patient by phone.  She reports that she is doing some better since the virtual visit on 2/1 with Rulon Abide, NP.  Patient has only used the Magic mouthwash 2 days but she feels like the mucositis is improving.   She does report ongoing generalized pain and myalgias particularly for the week following chemotherapy.  She was started on oxycodone and found that helped but did not fully relieve her symptoms or provide comfort.  Patient endorses stable moods.  She denies depression or uncontrolled anxiety.  She is on Cymbalta and alprazolam.  She does say she is having difficulty initiating sleep at night.  We talked about trying her alprazolam at bedtime and if that does not help we can discuss starting a new regimen (would  like to try trazodone).  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  Followed by Dr. Rogue Bussing.   Malignant ascites -last para on 1/14 with 2.7L removed.  Previously was requiring weekly paracentesis.  Patient reports improved ascites.  Plan therapeutic paracentesis as needed.  Discussed with Dr. Rogue Bussing, hopefully patient will not require Tenckhoff catheter given expected improvement with chemotherapy.  Neoplasm related pain -will refill and increase oxycodone to 5 to 10 mg every 4 hours as needed.  Myalgias -likely secondary to paclitaxel.  May use acetaminophen as needed.  Will start dexamethasone 4 mg daily x3 days following her chemotherapy  Oral mucositis -improving with Magic mouthwash  Insomnia -difficulty with initiation of sleep.  May use alprazolam at bedtime.  Consider trazodone if ineffective.  Goals of care -are aligned with ongoing treatment.  Will benefit from conversation regarding advanced care planning when patient can be seen in person  Case and plan discussed with Dr. Rogue Bussing  Follow Up Instructions: Follow-up telephone visit in 2 to 3 weeks   I discussed the assessment and treatment plan with the patient. The patient was provided an opportunity to ask questions and all were answered. The patient agreed with the plan and demonstrated an understanding of the instructions.   The patient was advised to call back or seek an in-person evaluation if the symptoms worsen or if the condition fails to improve as anticipated.  I provided 15 minutes of non-face-to-face time during this encounter.   Irean Hong, NP

## 2020-01-31 ENCOUNTER — Encounter: Payer: Self-pay | Admitting: Internal Medicine

## 2020-01-31 ENCOUNTER — Other Ambulatory Visit: Payer: Self-pay

## 2020-02-03 ENCOUNTER — Inpatient Hospital Stay (HOSPITAL_BASED_OUTPATIENT_CLINIC_OR_DEPARTMENT_OTHER): Payer: Medicaid Other | Admitting: Internal Medicine

## 2020-02-03 ENCOUNTER — Encounter: Payer: Self-pay | Admitting: Internal Medicine

## 2020-02-03 ENCOUNTER — Inpatient Hospital Stay: Payer: Medicaid Other

## 2020-02-03 ENCOUNTER — Other Ambulatory Visit: Payer: Self-pay

## 2020-02-03 DIAGNOSIS — C482 Malignant neoplasm of peritoneum, unspecified: Secondary | ICD-10-CM

## 2020-02-03 DIAGNOSIS — Z5111 Encounter for antineoplastic chemotherapy: Secondary | ICD-10-CM | POA: Diagnosis present

## 2020-02-03 DIAGNOSIS — K068 Other specified disorders of gingiva and edentulous alveolar ridge: Secondary | ICD-10-CM | POA: Diagnosis not present

## 2020-02-03 DIAGNOSIS — R180 Malignant ascites: Secondary | ICD-10-CM | POA: Diagnosis not present

## 2020-02-03 DIAGNOSIS — I82622 Acute embolism and thrombosis of deep veins of left upper extremity: Secondary | ICD-10-CM | POA: Diagnosis not present

## 2020-02-03 DIAGNOSIS — Z7901 Long term (current) use of anticoagulants: Secondary | ICD-10-CM | POA: Diagnosis not present

## 2020-02-03 DIAGNOSIS — Z5189 Encounter for other specified aftercare: Secondary | ICD-10-CM | POA: Diagnosis not present

## 2020-02-03 DIAGNOSIS — Z5112 Encounter for antineoplastic immunotherapy: Secondary | ICD-10-CM | POA: Diagnosis present

## 2020-02-03 DIAGNOSIS — F17210 Nicotine dependence, cigarettes, uncomplicated: Secondary | ICD-10-CM | POA: Diagnosis not present

## 2020-02-03 LAB — CBC WITH DIFFERENTIAL/PLATELET
Abs Immature Granulocytes: 0 10*3/uL (ref 0.00–0.07)
Band Neutrophils: 3 %
Basophils Absolute: 0.1 10*3/uL (ref 0.0–0.1)
Basophils Relative: 3 %
Eosinophils Absolute: 0 10*3/uL (ref 0.0–0.5)
Eosinophils Relative: 1 %
HCT: 38.2 % (ref 36.0–46.0)
Hemoglobin: 12.1 g/dL (ref 12.0–15.0)
Lymphocytes Relative: 47 %
Lymphs Abs: 1.4 10*3/uL (ref 0.7–4.0)
MCH: 29.9 pg (ref 26.0–34.0)
MCHC: 31.7 g/dL (ref 30.0–36.0)
MCV: 94.3 fL (ref 80.0–100.0)
Metamyelocytes Relative: 1 %
Monocytes Absolute: 0.2 10*3/uL (ref 0.1–1.0)
Monocytes Relative: 7 %
Neutro Abs: 1.2 10*3/uL — ABNORMAL LOW (ref 1.7–7.7)
Neutrophils Relative %: 38 %
Platelets: 83 10*3/uL — ABNORMAL LOW (ref 150–400)
RBC: 4.05 MIL/uL (ref 3.87–5.11)
RDW: 16.4 % — ABNORMAL HIGH (ref 11.5–15.5)
Smear Review: NORMAL
WBC Morphology: ABNORMAL
WBC: 2.9 10*3/uL — ABNORMAL LOW (ref 4.0–10.5)
nRBC: 0 % (ref 0.0–0.2)

## 2020-02-03 LAB — URINALYSIS, COMPLETE (UACMP) WITH MICROSCOPIC
Bacteria, UA: NONE SEEN
Bilirubin Urine: NEGATIVE
Glucose, UA: NEGATIVE mg/dL
Ketones, ur: NEGATIVE mg/dL
Leukocytes,Ua: NEGATIVE
Nitrite: NEGATIVE
Protein, ur: 30 mg/dL — AB
Specific Gravity, Urine: 1.024 (ref 1.005–1.030)
Squamous Epithelial / HPF: >50 — ABNORMAL HIGH (ref 0–5)
pH: 5 (ref 5.0–8.0)

## 2020-02-03 LAB — COMPREHENSIVE METABOLIC PANEL
ALT: 23 U/L (ref 0–44)
AST: 25 U/L (ref 15–41)
Albumin: 3.7 g/dL (ref 3.5–5.0)
Alkaline Phosphatase: 66 U/L (ref 38–126)
Anion gap: 10 (ref 5–15)
BUN: 11 mg/dL (ref 6–20)
CO2: 23 mmol/L (ref 22–32)
Calcium: 8.9 mg/dL (ref 8.9–10.3)
Chloride: 104 mmol/L (ref 98–111)
Creatinine, Ser: 0.67 mg/dL (ref 0.44–1.00)
GFR calc Af Amer: >60 mL/min (ref >60)
GFR calc non Af Amer: >60 mL/min (ref >60)
Glucose, Bld: 100 mg/dL — ABNORMAL HIGH (ref 70–99)
Potassium: 3.8 mmol/L (ref 3.5–5.1)
Sodium: 137 mmol/L (ref 135–145)
Total Bilirubin: 0.6 mg/dL (ref 0.3–1.2)
Total Protein: 7.8 g/dL (ref 6.5–8.1)

## 2020-02-03 NOTE — Progress Notes (Signed)
Nutrition Follow-up:  Patient with peritoneal adenocarcinoma (likely gynecologic origin).  Patient receiving chemotherapy but held today.  Receiving paracentesis as well.    Spoke with patient via phone for nutrition follow-up.  Patient reports that she has been trying to eat more and pairing foods together (ie fruit and yogurt, adding cheese to eggs, making smoothies).  Reports week of chemotherapy she usually has nausea and does not eat much.  Reports that she takes nausea medications.  Reports gums have been swollen, inflammed and bleeding.  The inflammation has improved.      Medications: reviewed  Labs: reviewed  Anthropometrics:   Weight decreased to 238 lb on 2/8 from 255 lb on 12/31.   Last paracentesis on 1/14 with 2.7 L removed  NUTRITION DIAGNOSIS: Inadequate oral intake stable   INTERVENTION:  Encouraged patient to continue with eating small frequent meals and pairing foods together to make sure she is getting good protein.  Patient has contact information    MONITORING, EVALUATION, GOAL: Patient will consume adequate calories and protein to maintain muscle mass.    NEXT VISIT: phone f/u March 15th  [name] B. Zenia Resides, [place], [place] Registered Dietitian [phone] (pager)

## 2020-02-03 NOTE — Assessment & Plan Note (Addendum)
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 Currently on neoadjuvant chemotherapy carbotaxol; added bev with cycle #2.  Clinically-improving.  CEA 125 improving.  #Hold  carbo-taxol-Bev cycle #3-today see below-,. Labs today reviewed; platelets 83/ANC 1.2  #Recent gum bleeding/ANC 1.3-hold chemotherapy this week.  We will plan chemotherapy in 1 week; order growth factors  # Malignant ascites-s/p mu triple paracentesis- improved.   # headaches- ? Avastin- tramadol/ TYlenol prn.   #Left upper extremity DVT-second underlying malignancy.-Stable Xarelto.   # DISPOSITION:   # HOLD carbo-taxol-Bev today # in 1 week- ; MD; labs- cbc;carbo-taxol-Bev; d-2- fulphila- Dr.B
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
## 2020-02-04 LAB — CA 125: Cancer Antigen (CA) 125: 1552 U/mL — ABNORMAL HIGH (ref 0.0–38.1)

## 2020-02-07 ENCOUNTER — Encounter: Payer: Self-pay | Admitting: Internal Medicine

## 2020-02-07 ENCOUNTER — Other Ambulatory Visit: Payer: Self-pay

## 2020-02-10 ENCOUNTER — Inpatient Hospital Stay: Payer: Medicaid Other

## 2020-02-10 ENCOUNTER — Telehealth: Payer: Self-pay | Admitting: *Deleted

## 2020-02-10 ENCOUNTER — Inpatient Hospital Stay (HOSPITAL_BASED_OUTPATIENT_CLINIC_OR_DEPARTMENT_OTHER): Payer: Medicaid Other | Admitting: Internal Medicine

## 2020-02-10 ENCOUNTER — Other Ambulatory Visit: Payer: Self-pay

## 2020-02-10 ENCOUNTER — Other Ambulatory Visit: Payer: Self-pay | Admitting: *Deleted

## 2020-02-10 DIAGNOSIS — C482 Malignant neoplasm of peritoneum, unspecified: Secondary | ICD-10-CM | POA: Diagnosis not present

## 2020-02-10 DIAGNOSIS — Z7189 Other specified counseling: Secondary | ICD-10-CM

## 2020-02-10 DIAGNOSIS — Z5112 Encounter for antineoplastic immunotherapy: Secondary | ICD-10-CM | POA: Diagnosis not present

## 2020-02-10 LAB — URINALYSIS, COMPLETE (UACMP) WITH MICROSCOPIC
Bacteria, UA: NONE SEEN
Bilirubin Urine: NEGATIVE
Glucose, UA: NEGATIVE mg/dL
Hgb urine dipstick: NEGATIVE
Ketones, ur: NEGATIVE mg/dL
Leukocytes,Ua: NEGATIVE
Nitrite: NEGATIVE
Protein, ur: NEGATIVE mg/dL
Specific Gravity, Urine: 1.008 (ref 1.005–1.030)
pH: 5 (ref 5.0–8.0)

## 2020-02-10 LAB — CBC WITH DIFFERENTIAL/PLATELET
Abs Immature Granulocytes: 0.01 10*3/uL (ref 0.00–0.07)
Basophils Absolute: 0 10*3/uL (ref 0.0–0.1)
Basophils Relative: 1 %
Eosinophils Absolute: 0.1 10*3/uL (ref 0.0–0.5)
Eosinophils Relative: 2 %
HCT: 37.6 % (ref 36.0–46.0)
Hemoglobin: 11.9 g/dL — ABNORMAL LOW (ref 12.0–15.0)
Immature Granulocytes: 0 %
Lymphocytes Relative: 52 %
Lymphs Abs: 1.3 10*3/uL (ref 0.7–4.0)
MCH: 30.4 pg (ref 26.0–34.0)
MCHC: 31.6 g/dL (ref 30.0–36.0)
MCV: 96.2 fL (ref 80.0–100.0)
Monocytes Absolute: 0.3 10*3/uL (ref 0.1–1.0)
Monocytes Relative: 13 %
Neutro Abs: 0.8 10*3/uL — ABNORMAL LOW (ref 1.7–7.7)
Neutrophils Relative %: 32 %
Platelets: 281 10*3/uL (ref 150–400)
RBC: 3.91 MIL/uL (ref 3.87–5.11)
RDW: 19.8 % — ABNORMAL HIGH (ref 11.5–15.5)
WBC: 2.6 10*3/uL — ABNORMAL LOW (ref 4.0–10.5)
nRBC: 0 % (ref 0.0–0.2)

## 2020-02-10 MED ORDER — SODIUM CHLORIDE 0.9 % IV SOLN
10.0000 mg | Freq: Once | INTRAVENOUS | Status: AC
  Administered 2020-02-10: 12:00:00 10 mg via INTRAVENOUS
  Filled 2020-02-10: qty 10

## 2020-02-10 MED ORDER — FAMOTIDINE IN NACL 20-0.9 MG/50ML-% IV SOLN
20.0000 mg | Freq: Once | INTRAVENOUS | Status: AC
  Administered 2020-02-10: 11:00:00 20 mg via INTRAVENOUS
  Filled 2020-02-10: qty 50

## 2020-02-10 MED ORDER — SODIUM CHLORIDE 0.9 % IV SOLN
900.0000 mg | Freq: Once | INTRAVENOUS | Status: AC
  Administered 2020-02-10: 17:00:00 900 mg via INTRAVENOUS
  Filled 2020-02-10: qty 90

## 2020-02-10 MED ORDER — SODIUM CHLORIDE 0.9 % IV SOLN
Freq: Once | INTRAVENOUS | Status: AC
  Filled 2020-02-10: qty 250

## 2020-02-10 MED ORDER — PALONOSETRON HCL INJECTION 0.25 MG/5ML
0.2500 mg | Freq: Once | INTRAVENOUS | Status: AC
  Administered 2020-02-10: 11:00:00 0.25 mg via INTRAVENOUS
  Filled 2020-02-10: qty 5

## 2020-02-10 MED ORDER — SODIUM CHLORIDE 0.9 % IV SOLN
150.0000 mg | Freq: Once | INTRAVENOUS | Status: AC
  Administered 2020-02-10: 11:00:00 150 mg via INTRAVENOUS
  Filled 2020-02-10: qty 5

## 2020-02-10 MED ORDER — DIPHENHYDRAMINE HCL 50 MG/ML IJ SOLN
50.0000 mg | Freq: Once | INTRAMUSCULAR | Status: AC
  Administered 2020-02-10: 11:00:00 50 mg via INTRAVENOUS
  Filled 2020-02-10: qty 1

## 2020-02-10 MED ORDER — SODIUM CHLORIDE 0.9 % IV SOLN
14.6000 mg/kg | Freq: Once | INTRAVENOUS | Status: AC
  Administered 2020-02-10: 12:00:00 1700 mg via INTRAVENOUS
  Filled 2020-02-10: qty 64

## 2020-02-10 MED ORDER — SODIUM CHLORIDE 0.9 % IV SOLN
200.0000 mg/m2 | Freq: Once | INTRAVENOUS | Status: AC
  Administered 2020-02-10: 13:00:00 456 mg via INTRAVENOUS
  Filled 2020-02-10: qty 76
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
## 2020-02-10 NOTE — Progress Notes (Signed)
ANC 0.8, Using UA from last week - ok per MD

## 2020-02-10 NOTE — Telephone Encounter (Signed)
Per v/o Dr. Rogue Bussing. Patient stuck 5 times via peripheral sticks today for her carbo/taxol/avastin. Pt not candidate for port given her avastin and risks for stopping her xarelto.  Per md order - Arranged for picc line insertion in left arm in the SDS dept at [facility]. Spoke with Caryl Pina. V/o placed for picc line placement under order's only encounter (RN To release). Date for picc line 3/5. Next tx is on 3/8. Caryl Pina in [facility] will arrange visit w/picc line team and will contact patient with apt time in [place].

## 2020-02-10 NOTE — Assessment & Plan Note (Addendum)
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 Currently on neoadjuvant chemotherapy carbotaxol; added bev with cycle #2.  Clinically-improving.  CEA 125 improving.  # proceed with carbo-taxol-Bev cycle #3-today see below-,. Labs today reviewed; platelets 273 ANC- 0.8; proceed with fulphila tomorrow.  We will plan to get imaging after cycle #4.  Patient has appointment with gynecology-oncology on March 15.  We will plan to get imaging before the visit with GYN-onc.   # Malignant ascites-s/p multiple  paracentesis- improved.   # headaches- ? Avastin- tramadol/ TYlenol prn.   #Left upper extremity DVT-second underlying malignancy.-STABLE- Xarelto.   # DISPOSITION:   # proceed with carbo-taxol-Bev today; 2/16- fulphila # in 3 weeks-MD-; labs- cbc;carbo-taxol-Bev; d-2- fulphila- Dr.B

## 2020-02-11 ENCOUNTER — Inpatient Hospital Stay: Payer: Medicaid Other

## 2020-02-11 ENCOUNTER — Telehealth: Payer: Self-pay

## 2020-02-12 ENCOUNTER — Other Ambulatory Visit: Payer: Self-pay

## 2020-02-12 ENCOUNTER — Inpatient Hospital Stay: Payer: Medicaid Other

## 2020-02-12 DIAGNOSIS — Z7189 Other specified counseling: Secondary | ICD-10-CM

## 2020-02-12 DIAGNOSIS — C482 Malignant neoplasm of peritoneum, unspecified: Secondary | ICD-10-CM

## 2020-02-12 DIAGNOSIS — Z5112 Encounter for antineoplastic immunotherapy: Secondary | ICD-10-CM | POA: Diagnosis not present

## 2020-02-12 MED ORDER — PEGFILGRASTIM-JMDB 6 MG/0.6ML ~~LOC~~ SOSY
6.0000 mg | PREFILLED_SYRINGE | Freq: Once | SUBCUTANEOUS | Status: AC
  Administered 2020-02-12: 6 mg via SUBCUTANEOUS
  Filled 2020-02-12: qty 0.6

## 2020-02-19 ENCOUNTER — Other Ambulatory Visit: Payer: Self-pay

## 2020-02-20 ENCOUNTER — Encounter: Payer: Self-pay | Admitting: Internal Medicine

## 2020-02-20 ENCOUNTER — Other Ambulatory Visit: Payer: Self-pay | Admitting: Family Medicine

## 2020-02-20 ENCOUNTER — Other Ambulatory Visit: Payer: Self-pay | Admitting: *Deleted

## 2020-02-20 ENCOUNTER — Inpatient Hospital Stay (HOSPITAL_BASED_OUTPATIENT_CLINIC_OR_DEPARTMENT_OTHER): Payer: Medicaid Other | Admitting: Hospice and Palliative Medicine

## 2020-02-20 DIAGNOSIS — G893 Neoplasm related pain (acute) (chronic): Secondary | ICD-10-CM

## 2020-02-20 DIAGNOSIS — R531 Weakness: Secondary | ICD-10-CM

## 2020-02-20 DIAGNOSIS — Z515 Encounter for palliative care: Secondary | ICD-10-CM | POA: Diagnosis not present

## 2020-02-20 DIAGNOSIS — C482 Malignant neoplasm of peritoneum, unspecified: Secondary | ICD-10-CM | POA: Diagnosis not present

## 2020-02-20 DIAGNOSIS — G4700 Insomnia, unspecified: Secondary | ICD-10-CM

## 2020-02-20 MED ORDER — METOPROLOL TARTRATE 25 MG PO TABS: 12.5000 mg | ORAL_TABLET | Freq: Two times a day (BID) | ORAL | 5 refills | Status: DC

## 2020-02-20 MED ORDER — TRAZODONE HCL 50 MG PO TABS: 25.0000 mg | ORAL_TABLET | Freq: Every evening | ORAL | 2 refills | Status: DC | PRN

## 2020-02-20 NOTE — Progress Notes (Addendum)
Virtual Visit via Telephone Note  I connected with [name] on 02/20/20 at 10:30 AM EST by telephone and verified that I am speaking with the correct person using two identifiers.   I discussed the limitations, risks, security and privacy concerns of performing an evaluation and management service by telephone and the availability of in person appointments. I also discussed with the patient that there may be a patient responsible charge related to this service. The patient expressed understanding and agreed to proceed.   History of Present Illness: [name] is a 49 year old woman with multiple medical problems including stage IV primary peritoneal carcinomatosis currently on treatment carboplatin/paclitaxel/Mvasi.  She has had recurrent malignant ascites requiring frequent large-volume paracentesis.  Patient has also had pain and recent oral mucositis.  She was referred to palliative care to help address goals and manage ongoing symptoms.  Patient is unmarried.  She lives at home with her 14 old son.  She has 2 sons and 2 daughters.  Patient previously worked as a Occupational psychologist at [employer].  She is currently disabled.   Observations/Objective: I called and spoke with patient by phone.    Patient reports that she is doing well.  She denies any significant changes or concerns.  Patient reports "okay" appetite.    She does endorse some weakness.  She sat down recently and had difficulty getting up without assistance.  Will refer to clinic OT for evaluation.    Patient reports stable pain.  She endorses chronic insomnia.  Patient is interested in trial of trazodone for sleep.  Moods are reportedly stable.  Patient requests refill of her metoprolol.  It appears that atenolol was prescribed by her PCP but medication was switched while she was in the hospital.  Patient reports tolerating metoprolol well.  BP and heart rate were stable (123/67, 88) at time of last clinic visit here on  02/10/2020.  Discussed with her PCP-Dr. Ancil Boozer.   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  Followed by Dr. Rogue Bussing.   Malignant ascites -last para on 1/14 with 2.7L removed.  Repeat paracentesis as needed.  Could consider Tenckhoff if requiring with increasing frequency.  Neoplasm related pain -stable.  Continue oxycodone to 5 to 10 mg every 4 hours as needed.  Insomnia -trial of trazodone 25 to 50 mg nightly as needed  Weakness -referral to clinic OT  Goals of care -are aligned with ongoing treatment.  Will benefit from conversation regarding advanced care planning when patient can be seen in person  Follow Up Instructions: Follow-up telephone visit in 3 to 4 weeks   I discussed the assessment and treatment plan with the patient. The patient was provided an opportunity to ask questions and all were answered. The patient agreed with the plan and demonstrated an understanding of the instructions.   The patient was advised to call back or seek an in-person evaluation if the symptoms worsen or if the condition fails to improve as anticipated.  I provided 10 minutes of non-face-to-face time during this encounter.   Irean Hong, NP

## 2020-02-21 ENCOUNTER — Ambulatory Visit: Payer: Self-pay | Admitting: Family Medicine

## 2020-02-21 MED ORDER — LORATADINE 10 MG PO TABS: 10.0000 mg | ORAL_TABLET | Freq: Every day | ORAL | 1 refills | Status: DC

## 2020-02-28 ENCOUNTER — Ambulatory Visit
Admission: RE | Admit: 2020-02-28 | Discharge: 2020-02-28 | Disposition: A | Discharge status: Home / Self Care | Payer: Self-pay | Source: Ambulatory Visit | Attending: Internal Medicine | Admitting: Internal Medicine

## 2020-02-28 ENCOUNTER — Other Ambulatory Visit: Payer: Self-pay

## 2020-02-28 ENCOUNTER — Ambulatory Visit
Admission: RE | Admit: 2020-02-28 | Discharge: 2020-02-28 | Disposition: A | Discharge status: Home / Self Care | Payer: Medicaid Other | Source: Ambulatory Visit | Attending: Internal Medicine | Admitting: Internal Medicine

## 2020-02-28 DIAGNOSIS — Z452 Encounter for adjustment and management of vascular access device: Secondary | ICD-10-CM | POA: Diagnosis not present

## 2020-02-28 MED ORDER — CHLORHEXIDINE GLUCONATE CLOTH 2 % EX PADS: 6.0000 | MEDICATED_PAD | Freq: Every day | CUTANEOUS | Status: DC

## 2020-02-28 MED ORDER — SODIUM CHLORIDE 0.9% FLUSH: 10.0000 mL | INTRAVENOUS | Status: DC | PRN

## 2020-02-28 NOTE — Progress Notes (Signed)
Peripherally Inserted Central Catheter/Midline Placement  The IV Nurse has discussed with the patient and/or persons authorized to consent for the patient, the purpose of this procedure and the potential benefits and risks involved with this procedure.  The benefits include less needle sticks, lab draws from the catheter, and the patient may be discharged home with the catheter. Risks include, but not limited to, infection, bleeding, blood clot (thrombus formation), and puncture of an artery; nerve damage and irregular heartbeat and possibility to perform a PICC exchange if needed/ordered by physician.  Alternatives to this procedure were also discussed.  Bard Power PICC patient education guide, fact sheet on infection prevention and patient information card has been provided to patient /or left at bedside.    PICC/Midline Placement Documentation  PICC Single Lumen 37/44/51 PICC Left Basilic 41 cm 1 cm (Active)  Indication for Insertion or Continuance of Line Prolonged intravenous therapies 02/28/20 1042  Exposed Catheter (cm) 1 cm 02/28/20 1042  Site Assessment Clean;Dry;Intact 02/28/20 1042  Line Status Flushed;Blood return noted 02/28/20 1042  Dressing Type Transparent 02/28/20 1042  Dressing Status Clean;Dry;Intact;Antimicrobial disc in place;Other (Comment) 02/28/20 1042  Dressing Intervention New dressing 02/28/20 4604  Dressing Change Due 03/06/20 02/28/20 1042       Christella Noa Albarece 02/28/2020, 10:43 AM

## 2020-03-02 ENCOUNTER — Inpatient Hospital Stay: Payer: Medicaid Other

## 2020-03-02 ENCOUNTER — Inpatient Hospital Stay: Payer: Medicaid Other | Attending: Internal Medicine

## 2020-03-02 ENCOUNTER — Encounter: Payer: Self-pay | Admitting: Internal Medicine

## 2020-03-02 ENCOUNTER — Other Ambulatory Visit: Payer: Self-pay

## 2020-03-02 ENCOUNTER — Inpatient Hospital Stay (HOSPITAL_BASED_OUTPATIENT_CLINIC_OR_DEPARTMENT_OTHER): Payer: Medicaid Other | Admitting: Internal Medicine

## 2020-03-02 DIAGNOSIS — Z8042 Family history of malignant neoplasm of prostate: Secondary | ICD-10-CM | POA: Diagnosis not present

## 2020-03-02 DIAGNOSIS — Z5189 Encounter for other specified aftercare: Secondary | ICD-10-CM | POA: Insufficient documentation

## 2020-03-02 DIAGNOSIS — F17210 Nicotine dependence, cigarettes, uncomplicated: Secondary | ICD-10-CM | POA: Insufficient documentation

## 2020-03-02 DIAGNOSIS — Z823 Family history of stroke: Secondary | ICD-10-CM | POA: Insufficient documentation

## 2020-03-02 DIAGNOSIS — Z803 Family history of malignant neoplasm of breast: Secondary | ICD-10-CM | POA: Diagnosis not present

## 2020-03-02 DIAGNOSIS — C482 Malignant neoplasm of peritoneum, unspecified: Secondary | ICD-10-CM

## 2020-03-02 DIAGNOSIS — Z7901 Long term (current) use of anticoagulants: Secondary | ICD-10-CM | POA: Diagnosis not present

## 2020-03-02 DIAGNOSIS — Z7952 Long term (current) use of systemic steroids: Secondary | ICD-10-CM | POA: Insufficient documentation

## 2020-03-02 DIAGNOSIS — Z5111 Encounter for antineoplastic chemotherapy: Secondary | ICD-10-CM | POA: Insufficient documentation

## 2020-03-02 DIAGNOSIS — Z86718 Personal history of other venous thrombosis and embolism: Secondary | ICD-10-CM | POA: Diagnosis not present

## 2020-03-02 DIAGNOSIS — Z806 Family history of leukemia: Secondary | ICD-10-CM | POA: Insufficient documentation

## 2020-03-02 DIAGNOSIS — R180 Malignant ascites: Secondary | ICD-10-CM | POA: Insufficient documentation

## 2020-03-02 DIAGNOSIS — Z793 Long term (current) use of hormonal contraceptives: Secondary | ICD-10-CM | POA: Diagnosis not present

## 2020-03-02 DIAGNOSIS — Z79899 Other long term (current) drug therapy: Secondary | ICD-10-CM | POA: Diagnosis not present

## 2020-03-02 DIAGNOSIS — Z85118 Personal history of other malignant neoplasm of bronchus and lung: Secondary | ICD-10-CM | POA: Diagnosis not present

## 2020-03-02 DIAGNOSIS — F329 Major depressive disorder, single episode, unspecified: Secondary | ICD-10-CM | POA: Insufficient documentation

## 2020-03-02 LAB — CBC WITH DIFFERENTIAL/PLATELET
Abs Immature Granulocytes: 0.03 10*3/uL (ref 0.00–0.07)
Basophils Absolute: 0 10*3/uL (ref 0.0–0.1)
Basophils Relative: 1 %
Eosinophils Absolute: 0 10*3/uL (ref 0.0–0.5)
Eosinophils Relative: 1 %
HCT: 35 % — ABNORMAL LOW (ref 36.0–46.0)
Hemoglobin: 11.2 g/dL — ABNORMAL LOW (ref 12.0–15.0)
Immature Granulocytes: 1 %
Lymphocytes Relative: 25 %
Lymphs Abs: 1.1 10*3/uL (ref 0.7–4.0)
MCH: 31.5 pg (ref 26.0–34.0)
MCHC: 32 g/dL (ref 30.0–36.0)
MCV: 98.6 fL (ref 80.0–100.0)
Monocytes Absolute: 0.7 10*3/uL (ref 0.1–1.0)
Monocytes Relative: 15 %
Neutro Abs: 2.5 10*3/uL (ref 1.7–7.7)
Neutrophils Relative %: 57 %
Platelets: 79 10*3/uL — ABNORMAL LOW (ref 150–400)
RBC: 3.55 MIL/uL — ABNORMAL LOW (ref 3.87–5.11)
RDW: 22.3 % — ABNORMAL HIGH (ref 11.5–15.5)
WBC: 4.4 10*3/uL (ref 4.0–10.5)
nRBC: 0 % (ref 0.0–0.2)

## 2020-03-02 LAB — COMPREHENSIVE METABOLIC PANEL
ALT: 26 U/L (ref 0–44)
AST: 23 U/L (ref 15–41)
Albumin: 3.8 g/dL (ref 3.5–5.0)
Alkaline Phosphatase: 64 U/L (ref 38–126)
Anion gap: 9 (ref 5–15)
BUN: 12 mg/dL (ref 6–20)
CO2: 24 mmol/L (ref 22–32)
Calcium: 8.9 mg/dL (ref 8.9–10.3)
Chloride: 102 mmol/L (ref 98–111)
Creatinine, Ser: 0.59 mg/dL (ref 0.44–1.00)
GFR calc Af Amer: >60 mL/min (ref >60)
GFR calc non Af Amer: >60 mL/min (ref >60)
Glucose, Bld: 112 mg/dL — ABNORMAL HIGH (ref 70–99)
Potassium: 4 mmol/L (ref 3.5–5.1)
Sodium: 135 mmol/L (ref 135–145)
Total Bilirubin: 0.4 mg/dL (ref 0.3–1.2)
Total Protein: 7.8 g/dL (ref 6.5–8.1)

## 2020-03-02 LAB — URINALYSIS, COMPLETE (UACMP) WITH MICROSCOPIC
Bacteria, UA: NONE SEEN
Bilirubin Urine: NEGATIVE
Glucose, UA: NEGATIVE mg/dL
Hgb urine dipstick: NEGATIVE
Ketones, ur: NEGATIVE mg/dL
Leukocytes,Ua: NEGATIVE
Nitrite: NEGATIVE
Protein, ur: 30 mg/dL — AB
Specific Gravity, Urine: 1.026 (ref 1.005–1.030)
pH: 5 (ref 5.0–8.0)

## 2020-03-02 MED ORDER — SODIUM CHLORIDE 0.9% FLUSH
10.0000 mL | Freq: Once | INTRAVENOUS | Status: AC
  Administered 2020-03-02: 10 mL via INTRAVENOUS
  Filled 2020-03-02: qty 10

## 2020-03-02 MED ORDER — OXYCODONE HCL 5 MG PO TABS: 5.0000 mg | ORAL_TABLET | Freq: Three times a day (TID) | ORAL | 0 refills | Status: DC | PRN
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
## 2020-03-02 NOTE — Assessment & Plan Note (Addendum)
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 Currently on neoadjuvant chemotherapy carbotaxol; added bev with cycle #3.  Clinically-improving; February 2021 1580 [down from 8000 baseline]  # HOLD carbo-taxol-Bev cycle #4-today see below.  Labs today reviewed; platelets 79.  We plan proceed with chemotherapy in 1 week.  Proceed with CT scan prior to GYN oncology visit; GYN oncology visit on March 17.  We will consider holding bevacizumab next cycle  # Malignant ascites-s/p multiple  paracentesis-improved.  #Joint pains back pain-question secondary to growth factor support; recommend NSAIDs/on creatinine.  Also given a refill on oxycodone.  # right upper extremity DVT-second underlying malignancy.-STABLE- Xarelto.  # LEFT UE PICC- weekly dressing changes.    # DISPOSITION:   # HOLD chemo today. # in 1 week; MD- carbo-taxol-Bev; 3/16- fulphila # CT scan A/P on 3/16.

## 2020-03-03 ENCOUNTER — Inpatient Hospital Stay: Payer: Medicaid Other

## 2020-03-03 LAB — CA 125: Cancer Antigen (CA) 125: 354 U/mL — ABNORMAL HIGH (ref 0.0–38.1)

## 2020-03-04 ENCOUNTER — Telehealth: Payer: Self-pay | Admitting: *Deleted

## 2020-03-04 ENCOUNTER — Telehealth: Payer: Self-pay | Admitting: Occupational Therapy

## 2020-03-04 NOTE — Telephone Encounter (Signed)
Patient contacted Rulon Abide, NP regarding the plan of care for dressing changes.  Per Dr. Rogue Bussing, no need to order home health at this time for picc line maintenance. Picc line placed on Friday last week. Dressing on Monday did not need to be changed at that visit. Chemo held on Monday 03/02/20 due to abn blood counts. patient will have chemotherapy next Monday. Per md - picc line maintenance will be performed at this visit.  After chemotherapy is infused, will set up picc line removal within a week's time.
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
## 2020-03-05 ENCOUNTER — Telehealth: Payer: Self-pay | Admitting: *Deleted

## 2020-03-05 ENCOUNTER — Other Ambulatory Visit: Payer: Self-pay

## 2020-03-05 ENCOUNTER — Inpatient Hospital Stay: Payer: Medicaid Other

## 2020-03-05 DIAGNOSIS — Z7901 Long term (current) use of anticoagulants: Secondary | ICD-10-CM

## 2020-03-05 DIAGNOSIS — Z452 Encounter for adjustment and management of vascular access device: Secondary | ICD-10-CM

## 2020-03-05 DIAGNOSIS — Z5111 Encounter for antineoplastic chemotherapy: Secondary | ICD-10-CM | POA: Diagnosis not present

## 2020-03-05 DIAGNOSIS — Z5189 Encounter for other specified aftercare: Secondary | ICD-10-CM

## 2020-03-05 DIAGNOSIS — K068 Other specified disorders of gingiva and edentulous alveolar ridge: Secondary | ICD-10-CM

## 2020-03-05 LAB — COMPREHENSIVE METABOLIC PANEL
ALT: 24 U/L (ref 0–44)
AST: 20 U/L (ref 15–41)
Albumin: 4 g/dL (ref 3.5–5.0)
Alkaline Phosphatase: 61 U/L (ref 38–126)
Anion gap: 9 (ref 5–15)
BUN: 16 mg/dL (ref 6–20)
CO2: 26 mmol/L (ref 22–32)
Calcium: 9.3 mg/dL (ref 8.9–10.3)
Chloride: 102 mmol/L (ref 98–111)
Creatinine, Ser: 0.58 mg/dL (ref 0.44–1.00)
GFR calc Af Amer: >60 mL/min (ref >60)
GFR calc non Af Amer: >60 mL/min (ref >60)
Glucose, Bld: 117 mg/dL — ABNORMAL HIGH (ref 70–99)
Potassium: 4 mmol/L (ref 3.5–5.1)
Sodium: 137 mmol/L (ref 135–145)
Total Bilirubin: 0.6 mg/dL (ref 0.3–1.2)
Total Protein: 7.9 g/dL (ref 6.5–8.1)

## 2020-03-05 LAB — CBC WITH DIFFERENTIAL/PLATELET
Abs Immature Granulocytes: 0.03 10*3/uL (ref 0.00–0.07)
Basophils Absolute: 0 10*3/uL (ref 0.0–0.1)
Basophils Relative: 1 %
Eosinophils Absolute: 0.1 10*3/uL (ref 0.0–0.5)
Eosinophils Relative: 1 %
HCT: 35.2 % — ABNORMAL LOW (ref 36.0–46.0)
Hemoglobin: 11.8 g/dL — ABNORMAL LOW (ref 12.0–15.0)
Immature Granulocytes: 1 %
Lymphocytes Relative: 30 %
Lymphs Abs: 1.8 10*3/uL (ref 0.7–4.0)
MCH: 32.7 pg (ref 26.0–34.0)
MCHC: 33.5 g/dL (ref 30.0–36.0)
MCV: 97.5 fL (ref 80.0–100.0)
Monocytes Absolute: 0.8 10*3/uL (ref 0.1–1.0)
Monocytes Relative: 13 %
Neutro Abs: 3.3 10*3/uL (ref 1.7–7.7)
Neutrophils Relative %: 54 %
Platelets: 75 10*3/uL — ABNORMAL LOW (ref 150–400)
RBC: 3.61 MIL/uL — ABNORMAL LOW (ref 3.87–5.11)
RDW: 23 % — ABNORMAL HIGH (ref 11.5–15.5)
WBC: 6 10*3/uL (ref 4.0–10.5)
nRBC: 0 % (ref 0.0–0.2)

## 2020-03-05 MED ORDER — SODIUM CHLORIDE 0.9% FLUSH
10.0000 mL | Freq: Once | INTRAVENOUS | Status: AC
  Administered 2020-03-05: 10 mL via INTRAVENOUS
  Filled 2020-03-05: qty 10

## 2020-03-05 MED ORDER — HEPARIN SOD (PORK) LOCK FLUSH 100 UNIT/ML IV SOLN
500.0000 [IU] | Freq: Once | INTRAVENOUS | Status: AC
  Administered 2020-03-05: 500 [IU] via INTRAVENOUS
  Filled 2020-03-05: qty 5

## 2020-03-05 NOTE — Telephone Encounter (Signed)
Hassan Rowan- please inform pt to get Labs- cbc/cmp today; HOLD xalreto for now. Salt and baking soda rinses every 4-6 hours. If not improved follow up in [facility] tomorrow.

## 2020-03-05 NOTE — Telephone Encounter (Signed)
Patient called reporting  that her gums are bleeding. Please advise. Her Platelet cnt Monday was 79K

## 2020-03-05 NOTE — Telephone Encounter (Signed)
Spoke to pt  that her gum bleeding is likely multi- factorial- platelets- 70s/ avastin/ xarelto. Recommended continue to hold xarelto ( right ue DVt on 12/30/2019). Continue salt/ baking soda rinses. Discussed that we will wait for her labs for 3/15 to decide on chemo on that day.

## 2020-03-05 NOTE — Telephone Encounter (Signed)
Patient will come for lab check at 245 today and she will wait for results

## 2020-03-06 ENCOUNTER — Other Ambulatory Visit: Payer: Self-pay

## 2020-03-09 ENCOUNTER — Inpatient Hospital Stay (HOSPITAL_BASED_OUTPATIENT_CLINIC_OR_DEPARTMENT_OTHER): Payer: Medicaid Other | Admitting: Internal Medicine

## 2020-03-09 ENCOUNTER — Inpatient Hospital Stay: Payer: Medicaid Other

## 2020-03-09 DIAGNOSIS — Z5111 Encounter for antineoplastic chemotherapy: Secondary | ICD-10-CM | POA: Diagnosis not present

## 2020-03-09 DIAGNOSIS — Z7189 Other specified counseling: Secondary | ICD-10-CM

## 2020-03-09 DIAGNOSIS — C482 Malignant neoplasm of peritoneum, unspecified: Secondary | ICD-10-CM | POA: Diagnosis not present

## 2020-03-09 LAB — CBC WITH DIFFERENTIAL/PLATELET
Abs Immature Granulocytes: 0.03 10*3/uL (ref 0.00–0.07)
Basophils Absolute: 0 10*3/uL (ref 0.0–0.1)
Basophils Relative: 1 %
Eosinophils Absolute: 0.1 10*3/uL (ref 0.0–0.5)
Eosinophils Relative: 2 %
HCT: 36.4 % (ref 36.0–46.0)
Hemoglobin: 12.1 g/dL (ref 12.0–15.0)
Immature Granulocytes: 1 %
Lymphocytes Relative: 28 %
Lymphs Abs: 1.3 10*3/uL (ref 0.7–4.0)
MCH: 33 pg (ref 26.0–34.0)
MCHC: 33.2 g/dL (ref 30.0–36.0)
MCV: 99.2 fL (ref 80.0–100.0)
Monocytes Absolute: 0.5 10*3/uL (ref 0.1–1.0)
Monocytes Relative: 12 %
Neutro Abs: 2.6 10*3/uL (ref 1.7–7.7)
Neutrophils Relative %: 56 %
Platelets: 94 10*3/uL — ABNORMAL LOW (ref 150–400)
RBC: 3.67 MIL/uL — ABNORMAL LOW (ref 3.87–5.11)
RDW: 23.2 % — ABNORMAL HIGH (ref 11.5–15.5)
WBC: 4.6 10*3/uL (ref 4.0–10.5)
nRBC: 0 % (ref 0.0–0.2)

## 2020-03-09 LAB — URINALYSIS, COMPLETE (UACMP) WITH MICROSCOPIC
Bilirubin Urine: NEGATIVE
Glucose, UA: NEGATIVE mg/dL
Hgb urine dipstick: NEGATIVE
Ketones, ur: NEGATIVE mg/dL
Nitrite: NEGATIVE
Protein, ur: 30 mg/dL — AB
Specific Gravity, Urine: 1.021 (ref 1.005–1.030)
pH: 5 (ref 5.0–8.0)

## 2020-03-09 LAB — COMPREHENSIVE METABOLIC PANEL
ALT: 23 U/L (ref 0–44)
AST: 21 U/L (ref 15–41)
Albumin: 4 g/dL (ref 3.5–5.0)
Alkaline Phosphatase: 58 U/L (ref 38–126)
Anion gap: 4 — ABNORMAL LOW (ref 5–15)
BUN: 15 mg/dL (ref 6–20)
CO2: 24 mmol/L (ref 22–32)
Calcium: 8.8 mg/dL — ABNORMAL LOW (ref 8.9–10.3)
Chloride: 106 mmol/L (ref 98–111)
Creatinine, Ser: 0.71 mg/dL (ref 0.44–1.00)
GFR calc Af Amer: >60 mL/min (ref >60)
GFR calc non Af Amer: >60 mL/min (ref >60)
Glucose, Bld: 115 mg/dL — ABNORMAL HIGH (ref 70–99)
Potassium: 4.1 mmol/L (ref 3.5–5.1)
Sodium: 134 mmol/L — ABNORMAL LOW (ref 135–145)
Total Bilirubin: 0.7 mg/dL (ref 0.3–1.2)
Total Protein: 8 g/dL (ref 6.5–8.1)

## 2020-03-09 MED ORDER — HEPARIN SOD (PORK) LOCK FLUSH 100 UNIT/ML IV SOLN
500.0000 [IU] | Freq: Once | INTRAVENOUS | Status: AC | PRN
  Administered 2020-03-09: 250 [IU]
  Filled 2020-03-09: qty 5

## 2020-03-09 MED ORDER — SODIUM CHLORIDE 0.9 % IV SOLN
200.0000 mg/m2 | Freq: Once | INTRAVENOUS | Status: AC
  Administered 2020-03-09: 456 mg via INTRAVENOUS
  Filled 2020-03-09: qty 76

## 2020-03-09 MED ORDER — FAMOTIDINE IN NACL 20-0.9 MG/50ML-% IV SOLN
20.0000 mg | Freq: Once | INTRAVENOUS | Status: AC
  Administered 2020-03-09: 20 mg via INTRAVENOUS
  Filled 2020-03-09: qty 50

## 2020-03-09 MED ORDER — SODIUM CHLORIDE 0.9% FLUSH
10.0000 mL | Freq: Once | INTRAVENOUS | Status: AC
  Administered 2020-03-09: 10 mL via INTRAVENOUS
  Filled 2020-03-09: qty 10

## 2020-03-09 MED ORDER — DIPHENHYDRAMINE HCL 50 MG/ML IJ SOLN
50.0000 mg | Freq: Once | INTRAMUSCULAR | Status: AC
  Administered 2020-03-09: 50 mg via INTRAVENOUS
  Filled 2020-03-09: qty 1

## 2020-03-09 MED ORDER — SODIUM CHLORIDE 0.9 % IV SOLN: 750.0000 mg | Freq: Once | INTRAVENOUS | Status: DC

## 2020-03-09 MED ORDER — SODIUM CHLORIDE 0.9 % IV SOLN
Freq: Once | INTRAVENOUS | Status: AC
  Filled 2020-03-09: qty 250

## 2020-03-09 MED ORDER — SODIUM CHLORIDE 0.9 % IV SOLN
750.0000 mg | Freq: Once | INTRAVENOUS | Status: AC
  Administered 2020-03-09: 750 mg via INTRAVENOUS
  Filled 2020-03-09: qty 75

## 2020-03-09 MED ORDER — PALONOSETRON HCL INJECTION 0.25 MG/5ML
0.2500 mg | Freq: Once | INTRAVENOUS | Status: AC
  Administered 2020-03-09: 0.25 mg via INTRAVENOUS
  Filled 2020-03-09: qty 5

## 2020-03-09 MED ORDER — SODIUM CHLORIDE 0.9 % IV SOLN
150.0000 mg | Freq: Once | INTRAVENOUS | Status: AC
  Administered 2020-03-09: 150 mg via INTRAVENOUS
  Filled 2020-03-09: qty 150

## 2020-03-09 MED ORDER — HEPARIN SOD (PORK) LOCK FLUSH 100 UNIT/ML IV SOLN
INTRAVENOUS | Status: AC
  Filled 2020-03-09: qty 5

## 2020-03-09 MED ORDER — SODIUM CHLORIDE 0.9 % IV SOLN
10.0000 mg | Freq: Once | INTRAVENOUS | Status: AC
  Administered 2020-03-09: 10 mg via INTRAVENOUS
  Filled 2020-03-09: qty 10

## 2020-03-09 NOTE — Progress Notes (Signed)
Nutrition Follow-up:  Patient with peritoneal adenocarcinoma (likely gynecologic origin).  Patient receiving last chemotherapy today before surgery.  Planning to meet with surgeon on 3/17.    Met with patient during infusion.  Patient reports appetite has improved especially with less fluid accumulation.  Reports that yesterday was able to eat protein bar in the morning, hamburger for lunch and grilled chicken salad for supper.    Last paracentesis 1/14  Medications: reviewed  Labs: reviewed  Anthropometrics:   Weight 242 lb today increased from 238 lb on 2/8   NUTRITION DIAGNOSIS: Inadequate oral intake improved   INTERVENTION:  Encouraged patient to continue eating well-balanced diet focusing on foods high in protein especially with upcoming surgery planned.  Can hold off on ensure/boost shakes with improved appetite Patient has contact information    MONITORING, EVALUATION, GOAL: Patient will consume adequate calories and protein to maintain muscle mass   NEXT VISIT: as needed  [name], [place], [place] Registered Dietitian [phone] (pager)
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
## 2020-03-09 NOTE — Assessment & Plan Note (Addendum)
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 Currently on neoadjuvant chemotherapy carbotaxol; added bev with cycle #2.  Clinically-improving; February 2021 350[down from 8000 baseline]  # proceed with carbo-taxol cycle #4-today see below.HOLD Bev today.  Labs today reviewed; platelets 94 [reduce AUC to 5]; Awaiting CT scan prior to GYN oncology visit; GYN oncology visit on March 17.   #Gum bleeding-multifactorial low platelets/chemotherapy-Avastin/Xarelto.  Currently improved.  Continue baking soda salt water rinses.  Hold Avastin Xarelto; platelets improved.  #Joint pains back pain-question secondary to growth factor support; stable.   # right upper extremity [ [jan 15th 2021].] SVT s/p xarelto- STOP xarelto now.   # LEFT UE PICC- weekly dressing changes; wants to keep for now.    # DISPOSITION:   # CARBO-TAXOL ONLY today; HOLD bev; d-2 fulphila # weekly PICC line dressing changes # follow up in 3 weeks- MD; labs-cbc/cmp/ca-125; PICC dressing- Dr.B

## 2020-03-10 ENCOUNTER — Other Ambulatory Visit: Payer: Self-pay

## 2020-03-10 ENCOUNTER — Inpatient Hospital Stay: Payer: Medicaid Other

## 2020-03-10 ENCOUNTER — Ambulatory Visit: Payer: Medicaid Other

## 2020-03-10 ENCOUNTER — Ambulatory Visit
Admission: RE | Admit: 2020-03-10 | Discharge: 2020-03-10 | Disposition: A | Discharge status: Home / Self Care | Payer: Medicaid Other | Source: Ambulatory Visit | Attending: Internal Medicine | Admitting: Internal Medicine

## 2020-03-10 DIAGNOSIS — C482 Malignant neoplasm of peritoneum, unspecified: Secondary | ICD-10-CM | POA: Insufficient documentation

## 2020-03-10 DIAGNOSIS — C481 Malignant neoplasm of specified parts of peritoneum: Secondary | ICD-10-CM | POA: Diagnosis not present

## 2020-03-10 DIAGNOSIS — C3411 Malignant neoplasm of upper lobe, right bronchus or lung: Secondary | ICD-10-CM | POA: Diagnosis not present

## 2020-03-10 DIAGNOSIS — Z452 Encounter for adjustment and management of vascular access device: Secondary | ICD-10-CM

## 2020-03-10 DIAGNOSIS — C786 Secondary malignant neoplasm of retroperitoneum and peritoneum: Secondary | ICD-10-CM | POA: Diagnosis not present

## 2020-03-10 DIAGNOSIS — Z5111 Encounter for antineoplastic chemotherapy: Secondary | ICD-10-CM | POA: Diagnosis not present

## 2020-03-10 DIAGNOSIS — Z7189 Other specified counseling: Secondary | ICD-10-CM

## 2020-03-10 HISTORY — DX: Essential (primary) hypertension: I10

## 2020-03-10 MED ORDER — IOHEXOL 300 MG/ML  SOLN
100.0000 mL | Freq: Once | INTRAMUSCULAR | Status: AC | PRN
  Administered 2020-03-10: 100 mL via INTRAVENOUS

## 2020-03-10 MED ORDER — SODIUM CHLORIDE 0.9% FLUSH
10.0000 mL | Freq: Once | INTRAVENOUS | Status: AC
  Administered 2020-03-10: 10 mL via INTRAVENOUS
  Filled 2020-03-10: qty 10

## 2020-03-10 MED ORDER — PEGFILGRASTIM-JMDB 6 MG/0.6ML ~~LOC~~ SOSY
6.0000 mg | PREFILLED_SYRINGE | Freq: Once | SUBCUTANEOUS | Status: AC
  Administered 2020-03-10: 6 mg via SUBCUTANEOUS
  Filled 2020-03-10: qty 0.6

## 2020-03-10 MED ORDER — HEPARIN SOD (PORK) LOCK FLUSH 100 UNIT/ML IV SOLN
500.0000 [IU] | Freq: Once | INTRAVENOUS | Status: AC
  Administered 2020-03-10: 500 [IU] via INTRAVENOUS
  Filled 2020-03-10: qty 5

## 2020-03-11 ENCOUNTER — Inpatient Hospital Stay: Payer: Medicaid Other

## 2020-03-11 ENCOUNTER — Telehealth: Payer: Self-pay | Admitting: Internal Medicine

## 2020-03-11 ENCOUNTER — Inpatient Hospital Stay (HOSPITAL_BASED_OUTPATIENT_CLINIC_OR_DEPARTMENT_OTHER): Payer: Medicaid Other | Admitting: Obstetrics and Gynecology

## 2020-03-11 VITALS — BP 166/96 | HR 97 | Temp 97.5°F | Resp 18 | Wt 243.0 lb

## 2020-03-11 DIAGNOSIS — Z5111 Encounter for antineoplastic chemotherapy: Secondary | ICD-10-CM | POA: Diagnosis not present

## 2020-03-11 DIAGNOSIS — Z86718 Personal history of other venous thrombosis and embolism: Secondary | ICD-10-CM

## 2020-03-11 DIAGNOSIS — C482 Malignant neoplasm of peritoneum, unspecified: Secondary | ICD-10-CM

## 2020-03-11 DIAGNOSIS — Z1509 Genetic susceptibility to other malignant neoplasm: Secondary | ICD-10-CM

## 2020-03-11 DIAGNOSIS — I82601 Acute embolism and thrombosis of unspecified veins of right upper extremity: Secondary | ICD-10-CM

## 2020-03-11 DIAGNOSIS — C801 Malignant (primary) neoplasm, unspecified: Secondary | ICD-10-CM

## 2020-03-11 DIAGNOSIS — Z1501 Genetic susceptibility to malignant neoplasm of breast: Secondary | ICD-10-CM | POA: Diagnosis not present

## 2020-03-11 NOTE — Telephone Encounter (Signed)
On 3/16 called pt- re: improved CT scan. Follow up with gyn-onc as planned
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
## 2020-03-11 NOTE — Progress Notes (Signed)
Patient here today for follow up regarding peritoneal carcinomatosis, surgery discussion. Patient denies any concerns today.
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
## 2020-03-11 NOTE — Patient Instructions (Addendum)
[facility] between the hours of 8:30a-1:00p. This is a drive thru. I will call you with your pre-admission testing appointment once it is scheduled. We will see you back two weeks after surgery. This appointment has been made for you.       DIVISION OF GYNECOLOGIC ONCOLOGY BOWEL PREP   The following instructions are extremely important to prepare for your surgery. Please follow them carefully   Step 1: Liquid Diet Instructions              Clear Liquid Diet for GYN Oncology Patients Day Before Surgery The day before your scheduled surgery DO NOT EAT any solid foods.  We do want you to drink enough liquids, but NO MILK products.  We do not want you to be dehydrated.  Clear liquids are defined as no milk products and no pieces of any solid food. Drink at least 64 oz. of fluid.  The following are all approved for you to drink the day before you surgery.  Chicken, Beef or Vegetable Broth (bouillon or consomm) - NO BROTH AFTER MIDNIGHT  Plain Jello  (no fruit)  Water  Strained lemonade or fruit punch  Gatorade (any flavor)  CLEAR Ensure or Boost Breeze  Fruit juices without pulp, such as apple, grape, or cranberry juice  Clear sodas - NO SODA AFTER MIDNIGHT  Ice Pops without bits of fruit or fruit pulp  Honey  Tea or coffee without milk or cream                 Any foods not on the above list should be avoided                                                                                             Step 2: Laxatives           The evening before surgery:   Time: around 5pm   Follow these instructions carefully.   Administer 1 Dulcolax suppository according to manufacturer instructions on the box. You will need to purchase this laxative at a pharmacy or grocery store.    Individual responses to laxatives vary; this prep may cause multiple bowel movements. It often works in 30 minutes and may take as long as 3 hours.  Stay near an available bathroom.    It is important to stay hydrated. Ensure you are still drinking clear liquids.     IMPORTANT: FOR YOUR SAFETY, WE WILL HAVE TO CANCEL YOUR SURGERY IF YOU DO NOT FOLLOW THESE INSTRUCTIONS.    Do not eat anything after midnight (including gum or candy) prior to your surgery.  Avoid drinking carbonated beverages after midnight.  You can have clear liquids up until one hour before you arrive at the hospital. "Nothing by mouth" means no liquids, gum, candy, etc for one hour before your arrival time.      Laparoscopy Laparoscopy is a procedure to diagnose diseases in the abdomen. During the procedure, a thin, lighted, pencil-sized instrument called a laparoscope is inserted into the abdomen through an incision. The laparoscope allows your health  care provider to look at the organs inside your body. LET [facility] CARE PROVIDER KNOW ABOUT:  Any allergies you have.  All medicines you are taking, including vitamins, herbs, eye drops, creams, and over-the-counter medicines.  Previous problems you or members of your family have had with the use of anesthetics.  Any blood disorders you have.  Previous surgeries you have had.  Medical conditions you have. RISKS AND COMPLICATIONS  Generally, this is a safe procedure. However, problems can occur, which may include:  Infection.  Bleeding.  Damage to other organs.  Allergic reaction to the anesthetics used during the procedure. BEFORE THE PROCEDURE  Do not eat or drink anything after midnight on the night before the procedure or as directed by your health care provider.  Ask your health care provider about: ? Changing or stopping your regular medicines. ? Taking medicines such as aspirin and ibuprofen. These medicines can thin your blood. Do not take these medicines before your procedure if your health care provider instructs you not to.  Plan to have someone take you home after the  procedure. PROCEDURE  You may be given a medicine to help you relax (sedative).  You will be given a medicine to make you sleep (general anesthetic).  Your abdomen will be inflated with a gas. This will make your organs easier to see.  Small incisions will be made in your abdomen.  A laparoscope and other small instruments will be inserted into the abdomen through the incisions.  A tissue sample may be removed from an organ in the abdomen for examination.  The instruments will be removed from the abdomen.  The gas will be released.  The incisions will be closed with stitches (sutures). AFTER THE PROCEDURE  Your blood pressure, heart rate, breathing rate, and blood oxygen level will be monitored often until the medicines you were given have worn off.   This information is not intended to replace advice given to you by your health care provider. Make sure you discuss any questions you have with your health care provider.                                             Bowel Symptoms After Surgery After gynecologic surgery, women often have temporary changes in bowel function (constipation and gas pain).  Following are tips to help prevent and treat common bowel problems.  It also tells you when to call the doctor.  This is important because some symptoms might be a sign of a more serious bowel problem such as obstruction (bowel blockage).  These problems are rare but can happen after gynecologic surgery.   Besides surgery, what can temporarily affect bowel function? 1. Dietary changes   2. Decreased physical activity   3.Antibiotics   4. Pain medication   How can I prevent constipation (three days or more without a stool)? 1. Include fiber in your diet: whole grains, raw or dried fruits & vegetables, prunes, prune/pear juiceDrink at least 8 glasses of liquid (preferably water) every day 2. Avoid: ? Gas forming foods such as broccoli, beans, peas, salads, cabbage, sweet  potatoes ? Greasy, fatty, or fried foods 3. Activity helps bowel function return to normal, walk around the house at least 3-4 times each day for 15 minutes or longer, if tolerated.  Rocking in a rocking chair is preferable to sitting still. 4.  Stool softeners: these are not laxatives, but serve to soften the stool to avoid straining.  Take 2-4 times a day until normal bowel function returns         Examples: Colace or generic equivalent (Docusate) 5. Bulk laxatives: provide a concentrated source of fiber.  They do not stimulate the bowel.  Take 1-2 times each day until normal bowel function return.              Examples: Citrucel, Metamucil, Fiberal, Fibercon   What can I take for "Gas Pains"? 1. Simethicone (Mylicon, Gas-X, Maalox-Gas, Mylanta-Gas) take 3-4 times a day 2. Maalox Regular - take 3-4 times a day 3. Mylanta Regular - take 3-4 times a day   What can I take if I become constipated? 1. Start with stool softeners and add additional laxatives below as needed to have a bowel movement every 1-2 days  2. Stool softeners 1-2 tablets, 2 times a day 3. Senokot 1-2 tablets, 1-2 times a day 4. Glycerin suppository can soften hard stool take once a day 5. Bisacodyl suppository once a day  6. Milk of Magnesia 30 mL 1-2 times a day 7. Fleets or tap water enema    What can I do for nausea?  1. Limit most solid foods for 24-48 hours 2. Continue eating small frequent amounts of liquids and/or bland soft foods ? Toast, crackers, cooked cereal (grits, cream of wheat, rice) 3. Benadryl: a mild anti-nausea medicine can be obtained without a prescription. May cause drowsiness, especially if taken with narcotic pain medicines 4. Contact provider for prescription nausea medication     What can I do, or take for diarrhea (more than five loose stools per day)? 1. Drink plenty of clear fluids to prevent dehydration 2. May take Kaopectate, Pepto-Bismol, Imodium, or probiotics for 1-2 days 3. Anusol  or Preparation-H can be helpful for hemorrhoids and irritated tissue around anus   When should I call the doctor?             CONSTIPATION:   Not relieved after three days following the above program VOMITING:  That contains blood, "coffee ground" material  More the three times/hour and unable to keep down nausea medication for more than eight hours  With dry mouth, dark or strong urine, feeling light-headed, dizzy, or confused  With severe abdominal pain or bloating for more than 24 hours DIARRHEA:  That continues for more then 24-48 hours despite treatment  That contains blood or tarry material  With dry mouth, dark or strong urine, feeling light~headed, dizzy, or confused FEVER:  101 F or higher along with nausea, vomiting, gas pain, diarrhea UNABLE TO:  Pass gas from rectum for more than 24 hours  Tolerate liquids by mouth for more than 24 hours        Laparoscopic Hysterectomy, Care After Refer to this sheet in the next few weeks. These instructions provide you with information on caring for yourself after your procedure. Your health care provider may also give you more specific instructions. Your treatment has been planned according to current medical practices, but problems sometimes occur. Call your health care provider if you have any problems or questions after your procedure. What can I expect after the procedure?  Pain and bruising at the incision sites. You will be given pain medicine to control it.  Menopausal symptoms such as hot flashes, night sweats, and insomnia if your ovaries were removed.  Sore throat from the breathing tube that was inserted during  surgery. Follow these instructions at home:  Only take over-the-counter or prescription medicines for pain, discomfort, or fever as directed by your health care provider.  Do not take aspirin. It can cause bleeding.  Do not drive when taking pain medicine.  Follow your health care provider's advice  regarding diet, exercise, lifting, driving, and general activities.  Resume your usual diet as directed and allowed.  Get plenty of rest and sleep.  Do not douche, use tampons, or have sexual intercourse for at least 6 weeks, or until your health care provider gives you permission.  Change your bandages (dressings) as directed by your health care provider.  Monitor your temperature and notify your health care provider of a fever.  Take showers instead of baths for 2-3 weeks.  Do not drink alcohol until your health care provider gives you permission.  If you develop constipation, you may take a mild laxative with your health care provider's permission. Bran foods may help with constipation problems. Drinking enough fluids to keep your urine clear or pale yellow may help as well.  Try to have someone home with you for 1-2 weeks to help around the house.  Keep all of your follow-up appointments as directed by your health care provider. Contact a health care provider if:  You have swelling, redness, or increasing pain around your incision sites.  You have pus coming from your incision.  You notice a bad smell coming from your incision.  Your incision breaks open.  You feel dizzy or lightheaded.  You have pain or bleeding when you urinate.  You have persistent diarrhea.  You have persistent nausea and vomiting.  You have abnormal vaginal discharge.  You have a rash.  You have any type of abnormal reaction or develop an allergy to your medicine.  You have poor pain control with your prescribed medicine. Get help right away if:  You have chest pain or shortness of breath.  You have severe abdominal pain that is not relieved with pain medicine.  You have pain or swelling in your legs. This information is not intended to replace advice given to you by your health care provider. Make sure you discuss any questions you have with your health care provider. Document Released:  10/02/2013 Document Revised: 05/19/2016 Document Reviewed: 07/02/2013 Elsevier Interactive Patient Education  2017 [employer].

## 2020-03-12 ENCOUNTER — Inpatient Hospital Stay (HOSPITAL_BASED_OUTPATIENT_CLINIC_OR_DEPARTMENT_OTHER): Payer: Medicaid Other | Admitting: Hospice and Palliative Medicine

## 2020-03-12 DIAGNOSIS — C801 Malignant (primary) neoplasm, unspecified: Secondary | ICD-10-CM

## 2020-03-12 DIAGNOSIS — C482 Malignant neoplasm of peritoneum, unspecified: Secondary | ICD-10-CM | POA: Diagnosis not present

## 2020-03-12 DIAGNOSIS — Z515 Encounter for palliative care: Secondary | ICD-10-CM

## 2020-03-12 DIAGNOSIS — G893 Neoplasm related pain (acute) (chronic): Secondary | ICD-10-CM

## 2020-03-12 MED ORDER — OXYCODONE HCL 5 MG PO TABS: 5.0000 mg | ORAL_TABLET | Freq: Three times a day (TID) | ORAL | 0 refills | Status: DC | PRN

## 2020-03-12 NOTE — Progress Notes (Signed)
Virtual Visit via Telephone Note  I connected with [name] on 03/12/20 at 11:30 AM EDT by telephone and verified that I am speaking with the correct person using two identifiers.   I discussed the limitations, risks, security and privacy concerns of performing an evaluation and management service by telephone and the availability of in person appointments. I also discussed with the patient that there may be a patient responsible charge related to this service. The patient expressed understanding and agreed to proceed.   History of Present Illness: [name] is a 49 year old woman with multiple medical problems including stage IV primary peritoneal carcinomatosis currently on treatment carboplatin/paclitaxel/Mvasi.  She has had recurrent malignant ascites requiring frequent large-volume paracentesis.  Patient has also had pain and recent oral mucositis.  She was referred to palliative care to help address goals and manage ongoing symptoms.  Patient is unmarried.  She lives at home with her 109 old son.  She has 2 sons and 2 daughters.  Patient previously worked as a Occupational psychologist at [employer].  She is currently disabled.   Observations/Objective: I called and spoke with patient by phone.    Patient reports doing reasonably well generally.  However, she does not feel great today following her recent treatment.  She reports increased pain following treatments.  She says that the oxycodone has helped with posttreatment pain when taken 5 to 10 mg every 8 hours as needed.  Dr. Rogue Bussing sent a prescription for oxycodone last week the patient has yet to pick this up.  She requested a range of 1 to 2 tablets, which I think is reasonable as she has tolerated in the past.  I called multiple pharmacies in the city of [place].  Unfortunately, all of the pharmacies did not have enough oxycodone in stock to fill a prescription for #60.  A prescription was sent to [facility] in [place].  Patient says  that the trazodone was effective at helping her sleep but that she felt groggy the next morning.  I recommended that she could half the dose and take 25 mg at bedtime and see if that is more effective.  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  Followed by Dr. Rogue Bussing. She is pending debulking sgy on 4/14.   Malignant ascites -last para on 1/14 with 2.7L removed.  Repeat paracentesis as needed.    Neoplasm related pain -stable.  Continue oxycodone to 5 to 10 mg every 8 hours as needed. #60  Insomnia -continue trazodone 25 to 50 mg nightly as needed  Follow Up Instructions: Follow-up telephone visit in 3 to 4 weeks   I discussed the assessment and treatment plan with the patient. The patient was provided an opportunity to ask questions and all were answered. The patient agreed with the plan and demonstrated an understanding of the instructions.   The patient was advised to call back or seek an in-person evaluation if the symptoms worsen or if the condition fails to improve as anticipated.  I provided 15 minutes of non-face-to-face time during this encounter.   Irean Hong, NP

## 2020-03-13 ENCOUNTER — Other Ambulatory Visit: Payer: Self-pay | Admitting: Nurse Practitioner

## 2020-03-13 ENCOUNTER — Other Ambulatory Visit: Payer: Self-pay | Admitting: *Deleted

## 2020-03-13 ENCOUNTER — Other Ambulatory Visit: Payer: Self-pay | Admitting: Internal Medicine

## 2020-03-13 ENCOUNTER — Other Ambulatory Visit: Payer: Self-pay

## 2020-03-13 ENCOUNTER — Encounter: Payer: Self-pay | Admitting: Internal Medicine

## 2020-03-13 MED ORDER — NEOMYCIN SULFATE 500 MG PO TABS: 1000.0000 mg | ORAL_TABLET | Freq: Three times a day (TID) | ORAL | 0 refills | Status: AC

## 2020-03-13 MED ORDER — ALPRAZOLAM 0.25 MG PO TABS: 0.2500 mg | ORAL_TABLET | Freq: Two times a day (BID) | ORAL | 0 refills | Status: DC | PRN

## 2020-03-13 MED ORDER — METRONIDAZOLE 500 MG PO TABS: 500.0000 mg | ORAL_TABLET | Freq: Three times a day (TID) | ORAL | 0 refills | Status: AC

## 2020-03-13 NOTE — Telephone Encounter (Signed)
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 Pt stated,  Was just wondering why my Tramadol refill was denied? I take that when I feel like I don't need the oxycodone  I spoke with Ander Purpura, NP who recommend Josh handle this RF at this time.

## 2020-03-13 NOTE — Telephone Encounter (Signed)
Patient requesting rf for xanax

## 2020-03-16 ENCOUNTER — Inpatient Hospital Stay: Payer: Medicaid Other

## 2020-03-16 ENCOUNTER — Other Ambulatory Visit: Payer: Self-pay

## 2020-03-16 DIAGNOSIS — Z5111 Encounter for antineoplastic chemotherapy: Secondary | ICD-10-CM | POA: Diagnosis not present

## 2020-03-16 DIAGNOSIS — Z452 Encounter for adjustment and management of vascular access device: Secondary | ICD-10-CM

## 2020-03-16 MED ORDER — SODIUM CHLORIDE 0.9% FLUSH
10.0000 mL | INTRAVENOUS | Status: DC | PRN
  Administered 2020-03-16: 10 mL via INTRAVENOUS
  Filled 2020-03-16: qty 10

## 2020-03-16 MED ORDER — HEPARIN SOD (PORK) LOCK FLUSH 100 UNIT/ML IV SOLN
500.0000 [IU] | Freq: Once | INTRAVENOUS | Status: AC
  Administered 2020-03-16: 250 [IU] via INTRAVENOUS
  Filled 2020-03-16: qty 5

## 2020-03-17 ENCOUNTER — Encounter: Payer: Self-pay | Admitting: Internal Medicine

## 2020-03-19 ENCOUNTER — Ambulatory Visit: Payer: Medicaid Other | Attending: Internal Medicine

## 2020-03-19 ENCOUNTER — Encounter: Payer: Self-pay | Admitting: Pharmacy Technician

## 2020-03-19 DIAGNOSIS — Z23 Encounter for immunization: Secondary | ICD-10-CM

## 2020-03-19 NOTE — Progress Notes (Signed)
   Covid-19 Vaccination Clinic  Name:  [name]    MRN: [id number] DOB: 27-Feb-1971  03/19/2020  [name] was observed post Covid-19 immunization for 15 minutes without incident. She was provided with Vaccine Information Sheet and instruction to access the V-Safe system.   [name] was instructed to call 911 with any severe reactions post vaccine: Marland Kitchen Difficulty breathing  . Swelling of face and throat  . A fast heartbeat  . A bad rash all over body  . Dizziness and weakness   Immunizations Administered    Name Date Dose VIS Date Route   Moderna COVID-19 Vaccine 03/19/2020 11:32 AM 0.5 mL 11/26/2019 Intramuscular   Manufacturer: Moderna   Lot: [id number]   [place]: [id number]

## 2020-03-19 NOTE — Progress Notes (Signed)
Patient no longer getting Mvasi from [facility] or Fulphila form [employer] based on Medicaid coverage. Last DOS covered is February.

## 2020-03-23 ENCOUNTER — Other Ambulatory Visit: Payer: Self-pay

## 2020-03-23 ENCOUNTER — Inpatient Hospital Stay: Payer: Medicaid Other

## 2020-03-23 DIAGNOSIS — Z452 Encounter for adjustment and management of vascular access device: Secondary | ICD-10-CM

## 2020-03-23 DIAGNOSIS — Z5111 Encounter for antineoplastic chemotherapy: Secondary | ICD-10-CM | POA: Diagnosis not present

## 2020-03-23 MED ORDER — HEPARIN SOD (PORK) LOCK FLUSH 100 UNIT/ML IV SOLN
500.0000 [IU] | Freq: Once | INTRAVENOUS | Status: AC
  Administered 2020-03-23: 250 [IU] via INTRAVENOUS
  Filled 2020-03-23: qty 5

## 2020-03-23 MED ORDER — SODIUM CHLORIDE 0.9% FLUSH
10.0000 mL | INTRAVENOUS | Status: DC | PRN
  Administered 2020-03-23: 10 mL via INTRAVENOUS
  Filled 2020-03-23: qty 10

## 2020-03-24 ENCOUNTER — Ambulatory Visit: Payer: Medicaid Other | Admitting: Family Medicine

## 2020-03-24 ENCOUNTER — Encounter: Payer: Self-pay | Admitting: Family Medicine

## 2020-03-24 VITALS — BP 128/80 | HR 107 | Temp 97.1°F | Resp 16 | Ht 63.0 in | Wt 247.3 lb

## 2020-03-24 DIAGNOSIS — F99 Mental disorder, not otherwise specified: Secondary | ICD-10-CM

## 2020-03-24 DIAGNOSIS — G43109 Migraine with aura, not intractable, without status migrainosus: Secondary | ICD-10-CM | POA: Diagnosis not present

## 2020-03-24 DIAGNOSIS — F330 Major depressive disorder, recurrent, mild: Secondary | ICD-10-CM | POA: Diagnosis not present

## 2020-03-24 DIAGNOSIS — F5105 Insomnia due to other mental disorder: Secondary | ICD-10-CM | POA: Diagnosis not present

## 2020-03-24 DIAGNOSIS — I517 Cardiomegaly: Secondary | ICD-10-CM | POA: Diagnosis not present

## 2020-03-24 DIAGNOSIS — M5136 Other intervertebral disc degeneration, lumbar region: Secondary | ICD-10-CM

## 2020-03-24 DIAGNOSIS — C801 Malignant (primary) neoplasm, unspecified: Secondary | ICD-10-CM

## 2020-03-24 DIAGNOSIS — Z716 Tobacco abuse counseling: Secondary | ICD-10-CM | POA: Diagnosis not present

## 2020-03-24 DIAGNOSIS — K219 Gastro-esophageal reflux disease without esophagitis: Secondary | ICD-10-CM | POA: Diagnosis not present

## 2020-03-24 DIAGNOSIS — C482 Malignant neoplasm of peritoneum, unspecified: Secondary | ICD-10-CM | POA: Diagnosis not present

## 2020-03-24 MED ORDER — SUMATRIPTAN SUCCINATE 100 MG PO TABS: 100.0000 mg | ORAL_TABLET | ORAL | 0 refills | Status: DC | PRN

## 2020-03-24 MED ORDER — TOPIRAMATE 50 MG PO TABS: 100.0000 mg | ORAL_TABLET | Freq: Two times a day (BID) | ORAL | 1 refills | Status: DC

## 2020-03-24 MED ORDER — DULOXETINE HCL 60 MG PO CPEP: 60.0000 mg | ORAL_CAPSULE | Freq: Every day | ORAL | 0 refills | Status: DC

## 2020-03-24 MED ORDER — QUETIAPINE FUMARATE 25 MG PO TABS: 25.0000 mg | ORAL_TABLET | Freq: Every day | ORAL | 0 refills | Status: DC

## 2020-03-24 MED ORDER — NICOTINE POLACRILEX 2 MG MT GUM: 2.0000 mg | CHEWING_GUM | OROMUCOSAL | 0 refills | Status: DC

## 2020-03-24 NOTE — Progress Notes (Signed)
Name: [name]   MRN: [id number]    DOB: 1971-02-24   Date:03/24/2020       Progress Note  Subjective  Chief Complaint  Chief Complaint  Patient presents with  . Depression  . 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 Alto [place]  Insomnia: having difficulty falling and staying asleep, she was given trazodone by cancer center but felt weird and no longer taking medication. Discussed Seroquel and she is willing to try it. She is able to fall asleep but wakes up during the night.   Cardiomegaly: on CT scan, interrupted sleep, may have sleep apnea, but since she will have surgery soon, we will refer to cardiology for possible echo , consider sleep study in the future  Migraines: she states episodes have been more frequent, about three  a week, lasting about 1-2 days, Elavil did not work, she was given topamax while at [facility] we increase dose and add Imitrex to take prn . She is taking prn Aleve or tylenol. Pain is described as frontal pressure and radiates to nuchal area , associated with photophobia but no phonophobia. Not associated with nausea or vomiting.   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 Arm vein blood clot, right 03/11/2020  . Malignant ascites 12/22/2019  . Acute dyspnea 12/22/2019  . Pleural effusion on right 12/22/2019  . Tachycardia 12/22/2019   . Primary peritoneal carcinomatosis ([place]) 12/16/2019  . Goals of care, counseling/discussion 12/16/2019  . Serous adenocarcinoma ([place]) 12/11/2019  . Erythrocytosis 11/12/2019  . Morbid obesity with BMI of 40.0-44.9, adult ([place]) 07/07/2018  . 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 Fever blister 05/17/2018  . Family history of breast cancer 05/08/2018  . Family history of colon cancer in mother 05/08/2018  . Migraine with aura and without status migrainosus 04/18/2018  . Dysmenorrhea 06/21/2017  . Menorrhagia 06/21/2017  . Mild recurrent major depression ([place]) 01/20/2016  . Esophageal reflux 01/20/2016  . Osteoarthritis, multiple sites 01/20/2016  . Frequent headaches 01/20/2016    Past Surgical History:  Procedure Laterality Date  . APPENDECTOMY     LSC but "ruptured when they did the surgery"  . CESAREAN SECTION    . IR THORACENTESIS ASP PLEURAL SPACE W/IMG GUIDE  12/06/2019  . PARACENTESIS    . TUBAL LIGATION     at time of CSxn  . WRIST SURGERY Left 11/21/2016   plates and screws inserted    Family History  Adopted: Yes  Problem Relation Age of Onset  . Lung cancer Father        deceased 51  . Breast cancer Mother 60       currently 33  . Colon cancer Mother   . ADD / ADHD Son   . ADD / ADHD Son   . Early death Maternal Aunt   . Breast cancer Maternal Aunt  34       deceased 59  . Breast cancer Maternal Grandmother   . Depression Daughter   . Depression Daughter   . Prostate cancer Paternal Uncle   . Stroke Paternal Uncle   . Leukemia Paternal Aunt   . Breast cancer Paternal Grandmother     Social History   Tobacco Use  . Smoking status: Current Every Day Smoker    Packs/day: 0.50    Years: 30.00    Pack years: 15.00    Types: Cigarettes    Last attempt to quit: 12/2002    Years since quitting: 17.2  . Smokeless tobacco: Never Used  Substance Use Topics  . Alcohol use: Yes    Alcohol/week: 0.0 standard drinks    Comment: Socially     Current Outpatient  Medications:  .  ALPRAZolam (XANAX) 0.25 MG tablet, Take 1 tablet (0.25 mg total) by mouth 2 (two) times daily as needed for anxiety. Please do not drive while on the medication- as this can cause dizziness., Disp: 30 tablet, Rfl: 0 .  B Complex-C (B-COMPLEX WITH VITAMIN C) tablet, Take 1 tablet by mouth daily., Disp: , Rfl:  .  Biotin w/ Vitamins C & E (HAIR/SKIN/NAILS PO), Take 1 tablet by mouth daily., Disp: , Rfl:  .  dexamethasone (DECADRON) 4 MG tablet, Take 1 tablet (4 mg total) by mouth daily. Take daily for 3 days following chemotherapy, Disp: 30 tablet, Rfl: 0 .  DULoxetine (CYMBALTA) 60 MG capsule, Take 1 capsule (60 mg total) by mouth daily., Disp: 90 capsule, Rfl: 0 .  levonorgestrel (MIRENA) 20 MCG/24HR IUD, 1 each by Intrauterine route once., Disp: , Rfl:  .  loratadine (CLARITIN) 10 MG tablet, Take 1 tablet (10 mg total) by mouth daily., Disp: 90 tablet, Rfl: 1 .  metoprolol tartrate (LOPRESSOR) 25 MG tablet, Take 0.5 tablets (12.5 mg total) by mouth 2 (two) times daily., Disp: 30 tablet, Rfl: 5 .  Multiple Vitamin (MULTIVITAMIN WITH MINERALS) TABS tablet, Take 1 tablet by mouth daily., Disp: , Rfl:  .  omeprazole (PRILOSEC) 20 MG capsule, Take 1 capsule (20 mg total) by mouth daily., Disp: 90 capsule, Rfl: 0 .  ondansetron (ZOFRAN) 8 MG tablet, One pill every 8 hours as needed for nausea/vomitting., Disp: 40 tablet, Rfl: 1 .  oxyCODONE (OXY IR/ROXICODONE) 5 MG immediate release tablet, Take 1-2 tablets (5-10 mg total) by mouth every 8 (eight) hours as needed for severe pain., Disp: 60 tablet, Rfl: 0 .  prochlorperazine (COMPAZINE) 10 MG tablet, Take 1 tablet (10 mg total) by mouth every 6 (six) hours as needed for nausea or vomiting., Disp: 40 tablet, Rfl: 1 .  topiramate (TOPAMAX) 50 MG tablet, Take 2 tablets (100 mg total) by mouth 2 (two) times daily., Disp: 180 tablet, Rfl: 1 .  traMADol (ULTRAM) 50 MG tablet, Take 1 tablet (50 mg total) by mouth every 8 (eight) hours as needed.,  Disp: 60 tablet, Rfl: 0 .  valACYclovir (VALTREX) 1000 MG tablet, Take 1 tablet (1,000 mg total) by mouth 2 (two) times daily as needed (outbreak)., Disp: 6 tablet, Rfl: 0 .  nicotine polacrilex (GOODSENSE NICOTINE) 2 MG gum, Take 1 each (2 mg total) by mouth every 4 (four) hours while awake., Disp: 100 tablet, Rfl: 0 .  QUEtiapine (SEROQUEL) 25 MG tablet, Take 1 tablet (25 mg total) by mouth at bedtime., Disp: 30 tablet, Rfl: 0 .  SUMAtriptan (IMITREX) 100 MG tablet, Take 1 tablet (100 mg total) by mouth every  2 (two) hours as needed for migraine. May repeat in 2 hours if headache persists or recurs., Disp: 10 tablet, Rfl: 0 No current facility-administered medications for this visit.  Facility-Administered Medications Ordered in Other Visits:  .  sodium chloride flush (NS) 0.9 % injection 10 mL, 10 mL, Intravenous, Once, Rogue Bussing, Elisha Headland, MD  No Known Allergies  I personally reviewed active problem list, medication list, allergies, family history, social history, health maintenance with the patient/caregiver today.   ROS  Constitutional: Negative for fever, positive for  weight change  - loss .  Respiratory: Negative for cough and shortness of breath.   Cardiovascular: Negative for chest pain or palpitations.  Gastrointestinal: Negative for abdominal pain, positive for  bowel changes - constipation due to chemo .  Musculoskeletal: Negative for gait problem or joint swelling.  Skin: Negative for rash.  Neurological: Negative for dizziness , positive for intermittent  headache.  No other specific complaints in a complete review of systems (except as listed in HPI above).  Objective  Vitals:   03/24/20 0826  BP: 128/80  Pulse: (!) 107  Resp: 16  Temp: (!) 97.1 F (36.2 C)  TempSrc: Temporal  SpO2: 98%  Weight: 247 lb 4.8 oz (112.2 kg)  Height: '5\' 3"'$  (1.6 m)    Body mass index is 43.81 kg/m.  Physical Exam  Constitutional: Patient appears well-developed and  well-nourished. ObeseNo distress.  HEENT: head atraumatic, normocephalic, pupils equal and reactive to light Skin: no hair secondary to chemo  Cardiovascular: Normal rate, regular rhythm and normal heart sounds.  No murmur heard. No BLE edema. Pulmonary/Chest: Effort normal and breath sounds normal. No respiratory distress. Abdominal: Soft.  There is no tenderness. Psychiatric: Patient has a normal mood and affect. behavior is normal. Judgment and thought content normal.   Recent Results (from the past 2160 hour(s))  Comprehensive metabolic panel     Status: Abnormal   Collection Time: 12/26/19 10:01 AM  Result Value Ref Range   Sodium 131 (L) 135 - 145 mmol/L   Potassium 4.4 3.5 - 5.1 mmol/L   Chloride 99 98 - 111 mmol/L   CO2 22 22 - 32 mmol/L   Glucose, Bld 109 (H) 70 - 99 mg/dL   BUN 13 6 - 20 mg/dL   Creatinine, Ser 0.75 0.44 - 1.00 mg/dL   Calcium 7.9 (L) 8.9 - 10.3 mg/dL   Total Protein 5.8 (L) 6.5 - 8.1 g/dL   Albumin 2.1 (L) 3.5 - 5.0 g/dL   AST 23 15 - 41 U/L   ALT 15 0 - 44 U/L   Alkaline Phosphatase 56 38 - 126 U/L   Total Bilirubin 0.4 0.3 - 1.2 mg/dL   GFR calc non Af Amer >60 >60 mL/min   GFR calc Af Amer >60 >60 mL/min   Anion gap 10 5 - 15    Comment: Performed at [facility], [address] [place], [place] [address]  CBC with Differential     Status: Abnormal   Collection Time: 12/26/19 10:01 AM  Result Value Ref Range   WBC 9.9 4.0 - 10.5 K/uL   RBC 4.83 3.87 - 5.11 MIL/uL   Hemoglobin 14.3 12.0 - 15.0 g/dL   HCT 44.2 36.0 - 46.0 %   MCV 91.5 80.0 - 100.0 fL   MCH 29.6 26.0 - 34.0 pg   MCHC 32.4 30.0 - 36.0 g/dL   RDW 12.5 11.5 - 15.5 %   Platelets 516 (H) 150 - 400 K/uL  nRBC 0.0 0.0 - 0.2 %   Neutrophils Relative % 88 %   Neutro Abs 8.7 (H) 1.7 - 7.7 K/uL   Lymphocytes Relative 9 %   Lymphs Abs 0.9 0.7 - 4.0 K/uL   Monocytes Relative 1 %   Monocytes Absolute 0.1 0.1 - 1.0 K/uL   Eosinophils Relative 1 %   Eosinophils Absolute 0.1 0.0  - 0.5 K/uL   Basophils Relative 1 %   Basophils Absolute 0.1 0.0 - 0.1 K/uL   Immature Granulocytes 0 %   Abs Immature Granulocytes 0.04 0.00 - 0.07 K/uL    Comment: Performed at [facility], [address] [place], [place] [address]  Urinalysis, Complete w Microscopic     Status: Abnormal   Collection Time: 12/26/19  1:42 PM  Result Value Ref Range   Color, Urine AMBER (A) YELLOW    Comment: BIOCHEMICALS MAY BE AFFECTED BY COLOR   APPearance TURBID (A) CLEAR   Specific Gravity, Urine 1.014 1.005 - 1.030   pH 5.0 5.0 - 8.0   Glucose, UA NEGATIVE NEGATIVE mg/dL   Hgb urine dipstick NEGATIVE NEGATIVE   Bilirubin Urine NEGATIVE NEGATIVE   Ketones, ur NEGATIVE NEGATIVE mg/dL   Protein, ur 30 (A) NEGATIVE mg/dL   Nitrite NEGATIVE NEGATIVE   Leukocytes,Ua TRACE (A) NEGATIVE   RBC / HPF 6-10 0 - 5 RBC/hpf   WBC, UA 6-10 0 - 5 WBC/hpf   Bacteria, UA NONE SEEN NONE SEEN   Squamous Epithelial / LPF >50 (H) 0 - 5   Mucus PRESENT     Comment: Performed at [facility], [address] [place], [place] [address]  CA 125     Status: Abnormal   Collection Time: 01/13/20  8:21 AM  Result Value Ref Range   Cancer Antigen (CA) 125 5,855.0 (H) 0.0 - 38.1 U/mL    Comment: (NOTE) Results confirmed on dilution. Roche [employer]ostics Electrochemiluminescence Immunoassay (ECLIA) Values obtained with different assay methods or kits cannot be used interchangeably.  Results cannot be interpreted as absolute evidence of the presence or absence of malignant disease. Performed At: [facility] [place], [place] [id number] Rush Farmer MD TD:[id number]   Comprehensive metabolic panel     Status: Abnormal   Collection Time: 01/13/20  8:21 AM  Result Value Ref Range   Sodium 137 135 - 145 mmol/L   Potassium 3.9 3.5 - 5.1 mmol/L   Chloride 106 98 - 111 mmol/L   CO2 23 22 - 32 mmol/L   Glucose, Bld 119 (H) 70 - 99 mg/dL   BUN 7 6 - 20 mg/dL   Creatinine, Ser  0.66 0.44 - 1.00 mg/dL   Calcium 8.4 (L) 8.9 - 10.3 mg/dL   Total Protein 6.2 (L) 6.5 - 8.1 g/dL   Albumin 2.6 (L) 3.5 - 5.0 g/dL   AST 18 15 - 41 U/L   ALT 12 0 - 44 U/L   Alkaline Phosphatase 59 38 - 126 U/L   Total Bilirubin 0.4 0.3 - 1.2 mg/dL   GFR calc non Af Amer >60 >60 mL/min   GFR calc Af Amer >60 >60 mL/min   Anion gap 8 5 - 15    Comment: Performed at [facility], [address] [place],  [address]  CBC with Differential     Status: None   Collection Time: 01/13/20  8:21 AM  Result Value Ref Range   WBC 4.8 4.0 - 10.5 K/uL   RBC 4.69 3.87 -  5.11 MIL/uL   Hemoglobin 13.5 12.0 - 15.0 g/dL   HCT 43.2 36.0 - 46.0 %   MCV 92.1 80.0 - 100.0 fL   MCH 28.8 26.0 - 34.0 pg   MCHC 31.3 30.0 - 36.0 g/dL   RDW 13.8 11.5 - 15.5 %   Platelets 168 150 - 400 K/uL   nRBC 0.0 0.0 - 0.2 %   Neutrophils Relative % 59 %   Neutro Abs 2.9 1.7 - 7.7 K/uL   Lymphocytes Relative 29 %   Lymphs Abs 1.4 0.7 - 4.0 K/uL   Monocytes Relative 9 %   Monocytes Absolute 0.4 0.1 - 1.0 K/uL   Eosinophils Relative 1 %   Eosinophils Absolute 0.0 0.0 - 0.5 K/uL   Basophils Relative 1 %   Basophils Absolute 0.1 0.0 - 0.1 K/uL   Immature Granulocytes 1 %   Abs Immature Granulocytes 0.03 0.00 - 0.07 K/uL    Comment: Performed at [facility], [address] [place], [place] [address]  Urinalysis, Complete w Microscopic     Status: Abnormal   Collection Time: 02/03/20  8:46 AM  Result Value Ref Range   Color, Urine AMBER (A) YELLOW    Comment: BIOCHEMICALS MAY BE AFFECTED BY COLOR   APPearance CLOUDY (A) CLEAR   Specific Gravity, Urine 1.024 1.005 - 1.030   pH 5.0 5.0 - 8.0   Glucose, UA NEGATIVE NEGATIVE mg/dL   Hgb urine dipstick SMALL (A) NEGATIVE   Bilirubin Urine NEGATIVE NEGATIVE   Ketones, ur NEGATIVE NEGATIVE mg/dL   Protein, ur 30 (A) NEGATIVE mg/dL   Nitrite NEGATIVE NEGATIVE   Leukocytes,Ua NEGATIVE NEGATIVE   RBC / HPF 0-5 0 - 5 RBC/hpf   WBC, UA 6-10 0 - 5  WBC/hpf   Bacteria, UA NONE SEEN NONE SEEN   Squamous Epithelial / LPF >50 (H) 0 - 5   Mucus PRESENT     Comment: Performed at [facility], [address] [place],  [address]  CA 125     Status: Abnormal   Collection Time: 02/03/20  9:00 AM  Result Value Ref Range   Cancer Antigen (CA) 125 1,552.0 (H) 0.0 - 38.1 U/mL    Comment: (NOTE) Roche [employer] Electrochemiluminescence Immunoassay (ECLIA) Values obtained with different assay methods or kits cannot be used interchangeably.  Results cannot be interpreted as absolute evidence of the presence or absence of malignant disease. Performed At: [facility] [place], [place] [id number] Rush Farmer MD OE:[id number]   Comprehensive metabolic panel     Status: Abnormal   Collection Time: 02/03/20  9:00 AM  Result Value Ref Range   Sodium 137 135 - 145 mmol/L   Potassium 3.8 3.5 - 5.1 mmol/L   Chloride 104 98 - 111 mmol/L   CO2 23 22 - 32 mmol/L   Glucose, Bld 100 (H) 70 - 99 mg/dL   BUN 11 6 - 20 mg/dL   Creatinine, Ser 0.67 0.44 - 1.00 mg/dL   Calcium 8.9 8.9 - 10.3 mg/dL   Total Protein 7.8 6.5 - 8.1 g/dL   Albumin 3.7 3.5 - 5.0 g/dL   AST 25 15 - 41 U/L   ALT 23 0 - 44 U/L   Alkaline Phosphatase 66 38 - 126 U/L   Total Bilirubin 0.6 0.3 - 1.2 mg/dL   GFR calc non Af Amer >60 >60 mL/min   GFR calc Af Amer >60 >60 mL/min   Anion gap 10 5 - 15  Comment: Performed at [facility], [address] [place], [place] [address]  CBC with Differential     Status: Abnormal   Collection Time: 02/03/20  9:00 AM  Result Value Ref Range   WBC 2.9 (L) 4.0 - 10.5 K/uL   RBC 4.05 3.87 - 5.11 MIL/uL   Hemoglobin 12.1 12.0 - 15.0 g/dL   HCT 38.2 36.0 - 46.0 %   MCV 94.3 80.0 - 100.0 fL   MCH 29.9 26.0 - 34.0 pg   MCHC 31.7 30.0 - 36.0 g/dL   RDW 16.4 (H) 11.5 - 15.5 %   Platelets 83 (L) 150 - 400 K/uL   nRBC 0.0 0.0 - 0.2 %   Neutrophils Relative % 38 %   Neutro Abs 1.2 (L) 1.7 -  7.7 K/uL   Band Neutrophils 3 %   Lymphocytes Relative 47 %   Lymphs Abs 1.4 0.7 - 4.0 K/uL   Monocytes Relative 7 %   Monocytes Absolute 0.2 0.1 - 1.0 K/uL   Eosinophils Relative 1 %   Eosinophils Absolute 0.0 0.0 - 0.5 K/uL   Basophils Relative 3 %   Basophils Absolute 0.1 0.0 - 0.1 K/uL   WBC Morphology      DIFF CONFIRMED BY MANUAL. OCC ABNORMAL LYMPHOCYTES NOTED   RBC Morphology UNREMARKABLE    Smear Review Normal platelet morphology     Comment: PLATELETS APPEAR DECREASED   Metamyelocytes Relative 1 %   Abs Immature Granulocytes 0.00 0.00 - 0.07 K/uL    Comment: Performed at [facility], [address] [place], [place] [address]  CBC with Differential     Status: Abnormal   Collection Time: 02/10/20  8:41 AM  Result Value Ref Range   WBC 2.6 (L) 4.0 - 10.5 K/uL   RBC 3.91 3.87 - 5.11 MIL/uL   Hemoglobin 11.9 (L) 12.0 - 15.0 g/dL   HCT 37.6 36.0 - 46.0 %   MCV 96.2 80.0 - 100.0 fL   MCH 30.4 26.0 - 34.0 pg   MCHC 31.6 30.0 - 36.0 g/dL   RDW 19.8 (H) 11.5 - 15.5 %   Platelets 281 150 - 400 K/uL   nRBC 0.0 0.0 - 0.2 %   Neutrophils Relative % 32 %   Neutro Abs 0.8 (L) 1.7 - 7.7 K/uL   Lymphocytes Relative 52 %   Lymphs Abs 1.3 0.7 - 4.0 K/uL   Monocytes Relative 13 %   Monocytes Absolute 0.3 0.1 - 1.0 K/uL   Eosinophils Relative 2 %   Eosinophils Absolute 0.1 0.0 - 0.5 K/uL   Basophils Relative 1 %   Basophils Absolute 0.0 0.0 - 0.1 K/uL   WBC Morphology OCC REACTIVE LYMPH NOTED ON SMEAR    RBC Morphology MORPHOLOGY UNREMARKABLE    Smear Review MORPHOLOGY UNREMARKABLE     Comment: Normal platelet morphology PLATELETS APPEAR ADEQUATE    Immature Granulocytes 0 %   Abs Immature Granulocytes 0.01 0.00 - 0.07 K/uL    Comment: Performed at [facility], [address] [place], [place] [address]  Urinalysis, Complete w Microscopic     Status: Abnormal   Collection Time: 02/10/20  1:38 PM  Result Value Ref Range   Color, Urine YELLOW (A) YELLOW    APPearance HAZY (A) CLEAR   Specific Gravity, Urine 1.008 1.005 - 1.030   pH 5.0 5.0 - 8.0   Glucose, UA NEGATIVE NEGATIVE mg/dL   Hgb urine dipstick NEGATIVE NEGATIVE   Bilirubin Urine NEGATIVE NEGATIVE  Ketones, ur NEGATIVE NEGATIVE mg/dL   Protein, ur NEGATIVE NEGATIVE mg/dL   Nitrite NEGATIVE NEGATIVE   Leukocytes,Ua NEGATIVE NEGATIVE   RBC / HPF 0-5 0 - 5 RBC/hpf   WBC, UA 0-5 0 - 5 WBC/hpf   Bacteria, UA NONE SEEN NONE SEEN   Squamous Epithelial / LPF 0-5 0 - 5   Mucus PRESENT     Comment: Performed at [facility], [address] [place], [place] [address]  Urinalysis, Complete w Microscopic     Status: Abnormal   Collection Time: 03/02/20  8:15 AM  Result Value Ref Range   Color, Urine AMBER (A) YELLOW    Comment: BIOCHEMICALS MAY BE AFFECTED BY COLOR   APPearance CLOUDY (A) CLEAR   Specific Gravity, Urine 1.026 1.005 - 1.030   pH 5.0 5.0 - 8.0   Glucose, UA NEGATIVE NEGATIVE mg/dL   Hgb urine dipstick NEGATIVE NEGATIVE   Bilirubin Urine NEGATIVE NEGATIVE   Ketones, ur NEGATIVE NEGATIVE mg/dL   Protein, ur 30 (A) NEGATIVE mg/dL   Nitrite NEGATIVE NEGATIVE   Leukocytes,Ua NEGATIVE NEGATIVE   RBC / HPF 0-5 0 - 5 RBC/hpf   WBC, UA 0-5 0 - 5 WBC/hpf   Bacteria, UA NONE SEEN NONE SEEN   Squamous Epithelial / LPF 21-50 0 - 5   Mucus PRESENT     Comment: Performed at [facility], [address] [place], [place] [address]  CA 125     Status: Abnormal   Collection Time: 03/02/20  8:15 AM  Result Value Ref Range   Cancer Antigen ([place]) 125 354.0 (H) 0.0 - 38.1 U/mL    Comment: (NOTE) Roche [employer] Electrochemiluminescence Immunoassay (ECLIA) Values obtained with different assay methods or kits cannot be used interchangeably.  Results cannot be interpreted as absolute evidence of the presence or absence of malignant disease. Performed At: [facility] [place], [place] [id number] Rush Farmer MD BE:[id number]    Comprehensive metabolic panel     Status: Abnormal   Collection Time: 03/02/20  8:15 AM  Result Value Ref Range   Sodium 135 135 - 145 mmol/L   Potassium 4.0 3.5 - 5.1 mmol/L   Chloride 102 98 - 111 mmol/L   CO2 24 22 - 32 mmol/L   Glucose, Bld 112 (H) 70 - 99 mg/dL    Comment: Glucose reference range applies only to samples taken after fasting for at least 8 hours.   BUN 12 6 - 20 mg/dL   Creatinine, Ser 0.59 0.44 - 1.00 mg/dL   Calcium 8.9 8.9 - 10.3 mg/dL   Total Protein 7.8 6.5 - 8.1 g/dL   Albumin 3.8 3.5 - 5.0 g/dL   AST 23 15 - 41 U/L   ALT 26 0 - 44 U/L   Alkaline Phosphatase 64 38 - 126 U/L   Total Bilirubin 0.4 0.3 - 1.2 mg/dL   GFR calc non Af Amer >60 >60 mL/min   GFR calc Af Amer >60 >60 mL/min   Anion gap 9 5 - 15    Comment: Performed at [facility], [address] [place], [place] [address]  CBC with Differential     Status: Abnormal   Collection Time: 03/02/20  8:15 AM  Result Value Ref Range   WBC 4.4 4.0 - 10.5 K/uL   RBC 3.55 (L) 3.87 - 5.11 MIL/uL   Hemoglobin 11.2 (L) 12.0 - 15.0 g/dL   HCT 35.0 (L) 36.0 - 46.0 %   MCV 98.6  80.0 - 100.0 fL   MCH 31.5 26.0 - 34.0 pg   MCHC 32.0 30.0 - 36.0 g/dL   RDW 22.3 (H) 11.5 - 15.5 %   Platelets 79 (L) 150 - 400 K/uL   nRBC 0.0 0.0 - 0.2 %   Neutrophils Relative % 57 %   Neutro Abs 2.5 1.7 - 7.7 K/uL   Lymphocytes Relative 25 %   Lymphs Abs 1.1 0.7 - 4.0 K/uL   Monocytes Relative 15 %   Monocytes Absolute 0.7 0.1 - 1.0 K/uL   Eosinophils Relative 1 %   Eosinophils Absolute 0.0 0.0 - 0.5 K/uL   Basophils Relative 1 %   Basophils Absolute 0.0 0.0 - 0.1 K/uL   Immature Granulocytes 1 %   Abs Immature Granulocytes 0.03 0.00 - 0.07 K/uL    Comment: Performed at [facility], [address] [place], [place] [address]  Comprehensive metabolic panel     Status: Abnormal   Collection Time: 03/05/20  2:54 PM  Result Value Ref Range   Sodium 137 135 - 145 mmol/L   Potassium 4.0 3.5 - 5.1 mmol/L    Chloride 102 98 - 111 mmol/L   CO2 26 22 - 32 mmol/L   Glucose, Bld 117 (H) 70 - 99 mg/dL    Comment: Glucose reference range applies only to samples taken after fasting for at least 8 hours.   BUN 16 6 - 20 mg/dL   Creatinine, Ser 0.58 0.44 - 1.00 mg/dL   Calcium 9.3 8.9 - 10.3 mg/dL   Total Protein 7.9 6.5 - 8.1 g/dL   Albumin 4.0 3.5 - 5.0 g/dL   AST 20 15 - 41 U/L   ALT 24 0 - 44 U/L   Alkaline Phosphatase 61 38 - 126 U/L   Total Bilirubin 0.6 0.3 - 1.2 mg/dL   GFR calc non Af Amer >60 >60 mL/min   GFR calc Af Amer >60 >60 mL/min   Anion gap 9 5 - 15    Comment: Performed at [facility], [address] [place], [place] [address]  CBC with Differential     Status: Abnormal   Collection Time: 03/05/20  2:54 PM  Result Value Ref Range   WBC 6.0 4.0 - 10.5 K/uL   RBC 3.61 (L) 3.87 - 5.11 MIL/uL   Hemoglobin 11.8 (L) 12.0 - 15.0 g/dL   HCT 35.2 (L) 36.0 - 46.0 %   MCV 97.5 80.0 - 100.0 fL   MCH 32.7 26.0 - 34.0 pg   MCHC 33.5 30.0 - 36.0 g/dL   RDW 23.0 (H) 11.5 - 15.5 %   Platelets 75 (L) 150 - 400 K/uL   nRBC 0.0 0.0 - 0.2 %   Neutrophils Relative % 54 %   Neutro Abs 3.3 1.7 - 7.7 K/uL   Lymphocytes Relative 30 %   Lymphs Abs 1.8 0.7 - 4.0 K/uL   Monocytes Relative 13 %   Monocytes Absolute 0.8 0.1 - 1.0 K/uL   Eosinophils Relative 1 %   Eosinophils Absolute 0.1 0.0 - 0.5 K/uL   Basophils Relative 1 %   Basophils Absolute 0.0 0.0 - 0.1 K/uL   Immature Granulocytes 1 %   Abs Immature Granulocytes 0.03 0.00 - 0.07 K/uL    Comment: Performed at [facility], [address] [place], [place] [address]  Urinalysis, Complete w Microscopic     Status: Abnormal   Collection Time: 03/09/20  9:09 AM  Result Value Ref Range  Color, Urine YELLOW (A) YELLOW   APPearance HAZY (A) CLEAR   Specific Gravity, Urine 1.021 1.005 - 1.030   pH 5.0 5.0 - 8.0   Glucose, UA NEGATIVE NEGATIVE mg/dL   Hgb urine dipstick NEGATIVE NEGATIVE   Bilirubin Urine NEGATIVE NEGATIVE    Ketones, ur NEGATIVE NEGATIVE mg/dL   Protein, ur 30 (A) NEGATIVE mg/dL   Nitrite NEGATIVE NEGATIVE   Leukocytes,Ua TRACE (A) NEGATIVE   RBC / HPF 0-5 0 - 5 RBC/hpf   WBC, UA 0-5 0 - 5 WBC/hpf   Bacteria, UA RARE (A) NONE SEEN   Squamous Epithelial / LPF 6-10 0 - 5   Mucus PRESENT    Hyaline Casts, UA PRESENT     Comment: Performed at [facility], [address] [place], [place] [address]  Comprehensive metabolic panel     Status: Abnormal   Collection Time: 03/09/20  9:09 AM  Result Value Ref Range   Sodium 134 (L) 135 - 145 mmol/L   Potassium 4.1 3.5 - 5.1 mmol/L   Chloride 106 98 - 111 mmol/L   CO2 24 22 - 32 mmol/L   Glucose, Bld 115 (H) 70 - 99 mg/dL    Comment: Glucose reference range applies only to samples taken after fasting for at least 8 hours.   BUN 15 6 - 20 mg/dL   Creatinine, Ser 0.71 0.44 - 1.00 mg/dL   Calcium 8.8 (L) 8.9 - 10.3 mg/dL   Total Protein 8.0 6.5 - 8.1 g/dL   Albumin 4.0 3.5 - 5.0 g/dL   AST 21 15 - 41 U/L   ALT 23 0 - 44 U/L   Alkaline Phosphatase 58 38 - 126 U/L   Total Bilirubin 0.7 0.3 - 1.2 mg/dL   GFR calc non Af Amer >60 >60 mL/min   GFR calc Af Amer >60 >60 mL/min   Anion gap 4 (L) 5 - 15    Comment: Performed at [facility], [address] [place], [place] [address]  CBC with Differential     Status: Abnormal   Collection Time: 03/09/20  9:09 AM  Result Value Ref Range   WBC 4.6 4.0 - 10.5 K/uL   RBC 3.67 (L) 3.87 - 5.11 MIL/uL   Hemoglobin 12.1 12.0 - 15.0 g/dL   HCT 36.4 36.0 - 46.0 %   MCV 99.2 80.0 - 100.0 fL   MCH 33.0 26.0 - 34.0 pg   MCHC 33.2 30.0 - 36.0 g/dL   RDW 23.2 (H) 11.5 - 15.5 %   Platelets 94 (L) 150 - 400 K/uL   nRBC 0.0 0.0 - 0.2 %   Neutrophils Relative % 56 %   Neutro Abs 2.6 1.7 - 7.7 K/uL   Lymphocytes Relative 28 %   Lymphs Abs 1.3 0.7 - 4.0 K/uL   Monocytes Relative 12 %   Monocytes Absolute 0.5 0.1 - 1.0 K/uL   Eosinophils Relative 2 %   Eosinophils Absolute 0.1 0.0 - 0.5 K/uL    Basophils Relative 1 %   Basophils Absolute 0.0 0.0 - 0.1 K/uL   Immature Granulocytes 1 %   Abs Immature Granulocytes 0.03 0.00 - 0.07 K/uL    Comment: Performed at [facility], [address] [place], [place] [address]      PHQ2/9: Depression screen [facility] 2/9 03/24/2020 12/10/2019 10/25/2019 05/17/2018 05/14/2018  Decreased Interest 0 1 0 1 0  Down, Depressed, Hopeless 0 2 0 1 0  PHQ - 2 Score 0 3 0  2 0  Altered sleeping 0 1 3 0 -  Tired, decreased energy 0 '1 1 1 '$ -  Change in appetite 0 1 0 0 -  Feeling bad or failure about yourself  0 0 1 0 -  Trouble concentrating 0 1 0 1 -  Moving slowly or fidgety/restless 0 0 0 0 -  Suicidal thoughts 0 0 0 0 -  PHQ-9 Score 0 '7 5 4 '$ -  Difficult doing work/chores - Somewhat difficult Somewhat difficult Somewhat difficult -    phq 9 is negative   Fall Risk: Fall Risk  03/24/2020 12/10/2019 10/25/2019 05/17/2018 05/17/2018  Falls in the past year? 0 0 0 No No  Number falls in past yr: 0 0 0 - -  Injury with Fall? 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 Migraine with aura and without status migrainosus, not intractable  - topiramate (TOPAMAX) 50 MG tablet; Take 2 tablets (100 mg total) by mouth 2 (two) times daily.  Dispense: 180 tablet; Refill: 1 - SUMAtriptan (IMITREX) 100 MG tablet; Take 1 tablet (100 mg total) by mouth every 2 (two) hours as needed for migraine. May repeat in 2 hours if  headache persists or recurs.  Dispense: 10 tablet; Refill: 0  3. Insomnia due to other mental disorder  - QUEtiapine (SEROQUEL) 25 MG tablet; Take 1 tablet (25 mg total) by mouth at bedtime.  Dispense: 30 tablet; Refill: 0  4. GERD without esophagitis  Taking medication, more hearburn with chemo   5. Primary peritoneal carcinomatosis ([place])  Tolerating treatment well   6. Cardiomegaly  - Ambulatory referral to Cardiology  7. Mild episode of recurrent major depressive disorder (HCC)  - DULoxetine (CYMBALTA) 60 MG capsule; Take 1 capsule (60 mg total) by mouth daily.  Dispense: 90 capsule; Refill: 0  8. Morbid obesity ([place])  Not a time to lose weight, getting chemo  9. DDD (degenerative disc disease), lumbar  - DULoxetine (CYMBALTA) 60 MG capsule; Take 1 capsule (60 mg total) by mouth daily.  Dispense: 90 capsule; Refill: 0  10. Mild episode of recurrent major depressive disorder (HCC)  - DULoxetine (CYMBALTA) 60 MG capsule; Take 1 capsule (60 mg total) by mouth daily.  Dispense: 90 capsule; Refill: 0  11. Encounter for tobacco use cessation counseling  - nicotine polacrilex (GOODSENSE NICOTINE) 2 MG gum; Take 1 each (2 mg total) by mouth every 4 (four) hours while awake.  Dispense: 100 tablet; Refill: 0

## 2020-03-26 HISTORY — PX: ABDOMINAL HYSTERECTOMY: SHX81

## 2020-03-27 ENCOUNTER — Ambulatory Visit
Admission: RE | Admit: 2020-03-27 | Discharge: 2020-03-27 | Disposition: A | Discharge status: Home / Self Care | Payer: Medicaid Other | Source: Ambulatory Visit | Attending: Family Medicine | Admitting: Family Medicine

## 2020-03-27 ENCOUNTER — Encounter: Payer: Self-pay | Admitting: Internal Medicine

## 2020-03-27 ENCOUNTER — Other Ambulatory Visit: Payer: Self-pay

## 2020-03-27 DIAGNOSIS — Z1231 Encounter for screening mammogram for malignant neoplasm of breast: Secondary | ICD-10-CM | POA: Diagnosis not present

## 2020-03-27 HISTORY — DX: Personal history of antineoplastic chemotherapy: Z92.21

## 2020-03-30 ENCOUNTER — Inpatient Hospital Stay: Payer: Medicaid Other | Attending: Internal Medicine | Admitting: Internal Medicine

## 2020-03-30 ENCOUNTER — Other Ambulatory Visit: Payer: Self-pay

## 2020-03-30 ENCOUNTER — Ambulatory Visit
Admission: RE | Admit: 2020-03-30 | Discharge: 2020-03-30 | Disposition: A | Discharge status: Home / Self Care | Payer: Medicaid Other | Source: Ambulatory Visit | Attending: Internal Medicine | Admitting: Internal Medicine

## 2020-03-30 ENCOUNTER — Inpatient Hospital Stay: Payer: Medicaid Other

## 2020-03-30 ENCOUNTER — Ambulatory Visit (INDEPENDENT_AMBULATORY_CARE_PROVIDER_SITE_OTHER): Payer: Medicaid Other | Admitting: Cardiology

## 2020-03-30 ENCOUNTER — Encounter: Payer: Self-pay | Admitting: Cardiology

## 2020-03-30 VITALS — BP 112/71 | HR 79 | Temp 96.8°F | Resp 20 | Ht 63.0 in | Wt 247.0 lb

## 2020-03-30 VITALS — BP 120/90 | HR 173 | Ht 63.0 in | Wt 246.0 lb

## 2020-03-30 DIAGNOSIS — Z90722 Acquired absence of ovaries, bilateral: Secondary | ICD-10-CM | POA: Insufficient documentation

## 2020-03-30 DIAGNOSIS — Z9221 Personal history of antineoplastic chemotherapy: Secondary | ICD-10-CM | POA: Diagnosis not present

## 2020-03-30 DIAGNOSIS — Z7901 Long term (current) use of anticoagulants: Secondary | ICD-10-CM | POA: Insufficient documentation

## 2020-03-30 DIAGNOSIS — Z9079 Acquired absence of other genital organ(s): Secondary | ICD-10-CM | POA: Diagnosis not present

## 2020-03-30 DIAGNOSIS — F17200 Nicotine dependence, unspecified, uncomplicated: Secondary | ICD-10-CM | POA: Diagnosis not present

## 2020-03-30 DIAGNOSIS — C482 Malignant neoplasm of peritoneum, unspecified: Secondary | ICD-10-CM | POA: Insufficient documentation

## 2020-03-30 DIAGNOSIS — Z452 Encounter for adjustment and management of vascular access device: Secondary | ICD-10-CM | POA: Diagnosis not present

## 2020-03-30 DIAGNOSIS — R202 Paresthesia of skin: Secondary | ICD-10-CM | POA: Diagnosis not present

## 2020-03-30 DIAGNOSIS — I82621 Acute embolism and thrombosis of deep veins of right upper extremity: Secondary | ICD-10-CM | POA: Diagnosis not present

## 2020-03-30 DIAGNOSIS — R180 Malignant ascites: Secondary | ICD-10-CM | POA: Diagnosis not present

## 2020-03-30 DIAGNOSIS — N951 Menopausal and female climacteric states: Secondary | ICD-10-CM | POA: Diagnosis not present

## 2020-03-30 DIAGNOSIS — Z79899 Other long term (current) drug therapy: Secondary | ICD-10-CM | POA: Insufficient documentation

## 2020-03-30 DIAGNOSIS — I517 Cardiomegaly: Secondary | ICD-10-CM | POA: Diagnosis not present

## 2020-03-30 DIAGNOSIS — F17210 Nicotine dependence, cigarettes, uncomplicated: Secondary | ICD-10-CM | POA: Diagnosis not present

## 2020-03-30 DIAGNOSIS — C3411 Malignant neoplasm of upper lobe, right bronchus or lung: Secondary | ICD-10-CM | POA: Insufficient documentation

## 2020-03-30 DIAGNOSIS — Z90710 Acquired absence of both cervix and uterus: Secondary | ICD-10-CM | POA: Insufficient documentation

## 2020-03-30 DIAGNOSIS — R9431 Abnormal electrocardiogram [ECG] [EKG]: Secondary | ICD-10-CM | POA: Diagnosis not present

## 2020-03-30 DIAGNOSIS — K5900 Constipation, unspecified: Secondary | ICD-10-CM | POA: Insufficient documentation

## 2020-03-30 DIAGNOSIS — Z95828 Presence of other vascular implants and grafts: Secondary | ICD-10-CM

## 2020-03-30 DIAGNOSIS — I119 Hypertensive heart disease without heart failure: Secondary | ICD-10-CM | POA: Diagnosis not present

## 2020-03-30 DIAGNOSIS — R200 Anesthesia of skin: Secondary | ICD-10-CM | POA: Diagnosis not present

## 2020-03-30 DIAGNOSIS — K068 Other specified disorders of gingiva and edentulous alveolar ridge: Secondary | ICD-10-CM | POA: Insufficient documentation

## 2020-03-30 LAB — COMPREHENSIVE METABOLIC PANEL
ALT: 23 U/L (ref 0–44)
AST: 19 U/L (ref 15–41)
Albumin: 3.9 g/dL (ref 3.5–5.0)
Alkaline Phosphatase: 61 U/L (ref 38–126)
Anion gap: 9 (ref 5–15)
BUN: 20 mg/dL (ref 6–20)
CO2: 24 mmol/L (ref 22–32)
Calcium: 9.4 mg/dL (ref 8.9–10.3)
Chloride: 105 mmol/L (ref 98–111)
Creatinine, Ser: 0.7 mg/dL (ref 0.44–1.00)
GFR calc Af Amer: >60 mL/min (ref >60)
GFR calc non Af Amer: >60 mL/min (ref >60)
Glucose, Bld: 107 mg/dL — ABNORMAL HIGH (ref 70–99)
Potassium: 4.1 mmol/L (ref 3.5–5.1)
Sodium: 138 mmol/L (ref 135–145)
Total Bilirubin: 0.4 mg/dL (ref 0.3–1.2)
Total Protein: 8 g/dL (ref 6.5–8.1)

## 2020-03-30 LAB — CBC WITH DIFFERENTIAL/PLATELET
Abs Immature Granulocytes: 0.02 10*3/uL (ref 0.00–0.07)
Basophils Absolute: 0 10*3/uL (ref 0.0–0.1)
Basophils Relative: 1 %
Eosinophils Absolute: 0 10*3/uL (ref 0.0–0.5)
Eosinophils Relative: 0 %
HCT: 33.5 % — ABNORMAL LOW (ref 36.0–46.0)
Hemoglobin: 11.4 g/dL — ABNORMAL LOW (ref 12.0–15.0)
Immature Granulocytes: 0 %
Lymphocytes Relative: 23 %
Lymphs Abs: 1 10*3/uL (ref 0.7–4.0)
MCH: 35.7 pg — ABNORMAL HIGH (ref 26.0–34.0)
MCHC: 34 g/dL (ref 30.0–36.0)
MCV: 105 fL — ABNORMAL HIGH (ref 80.0–100.0)
Monocytes Absolute: 0.6 10*3/uL (ref 0.1–1.0)
Monocytes Relative: 14 %
Neutro Abs: 2.7 10*3/uL (ref 1.7–7.7)
Neutrophils Relative %: 62 %
Platelets: 176 10*3/uL (ref 150–400)
RBC: 3.19 MIL/uL — ABNORMAL LOW (ref 3.87–5.11)
RDW: 20.9 % — ABNORMAL HIGH (ref 11.5–15.5)
WBC: 4.5 10*3/uL (ref 4.0–10.5)
nRBC: 0 % (ref 0.0–0.2)

## 2020-03-30 MED ORDER — HEPARIN SOD (PORK) LOCK FLUSH 100 UNIT/ML IV SOLN
500.0000 [IU] | Freq: Once | INTRAVENOUS | Status: AC
  Administered 2020-03-30: 10:00:00 250 [IU] via INTRAVENOUS
  Filled 2020-03-30: qty 5

## 2020-03-30 MED ORDER — SODIUM CHLORIDE 0.9% FLUSH
10.0000 mL | Freq: Once | INTRAVENOUS | Status: AC
  Administered 2020-03-30: 10 mL via INTRAVENOUS
  Filled 2020-03-30: qty 10
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
## 2020-03-30 NOTE — Assessment & Plan Note (Addendum)
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 Currently on neoadjuvant chemotherapy carbotaxol; added bev with cycle #2.  Clinically-improving; February 2021 350[down from 8000 baseline]  #Patient currently status post 4 cycles of carbo-taxol-Avastin; Avastin held with cycle #4 given the plan for upcoming debulking surgery planned on 4/14.  March 28-2021 CT scan shows-significant improvement on the omental/peritoneal carcinomatosis.  No new lesions noted.  Discussed with Dr. Theora Gianotti  # Gum bleeding-improved.   #Joint pains back pain-question secondary to growth factor support; stable.   # right upper extremity [ [jan 15th 2021].] SVT s/p xarelto- STOP xarelto now.   # LEFT UE PICC- weekly dressing changes; ? Change in position- will order CXR.    # DISPOSITION:  #CXR today # follow up in 3 weeks- MD; labs-cbc/cmp/ca-125; PICC dressing; NO chemo- Dr.B  # I reviewed the blood work- with the patient in detail; also reviewed the imaging independently [as summarized above]; and with the patient in detail.    Addendum: Ca1 2 5 improved at 129.  Patient informed.
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
## 2020-03-30 NOTE — Patient Instructions (Signed)
Medication Instructions:  - Your physician recommends that you continue on your current medications as directed. Please refer to the Current Medication list given to you today.  *If you need a refill on your cardiac medications before your next appointment, please call your pharmacy*   Lab Work: - none ordered  If you have labs (blood work) drawn today and your tests are completely normal, you will receive your results only by: Marland Kitchen MyChart Message (if you have MyChart) OR . A paper copy in the mail If you have any lab test that is abnormal or we need to change your treatment, we will call you to review the results.   Testing/Procedures: - Your physician has requested that you have an echocardiogram. Echocardiography is a painless test that uses sound waves to create images of your heart. It provides your doctor with information about the size and shape of your heart and how well your heart's chambers and valves are working. This procedure takes approximately one hour. There are no restrictions for this procedure. We do occasionally have to start an IV on some patients during these test to inject an image enhancing agent. Please make sure to drink some water prior to coming in.    Follow-Up: At [facility], you and your health needs are our priority.  As part of our continuing mission to provide you with exceptional heart care, we have created designated Provider Care Teams.  These Care Teams include your primary Cardiologist (physician) and Advanced Practice Providers (APPs -  Physician Assistants and Nurse Practitioners) who all work together to provide you with the care you need, when you need it.  We recommend signing up for the patient portal called "MyChart".  Sign up information is provided on this After Visit Summary.  MyChart is used to connect with patients for Virtual Visits (Telemedicine).  Patients are able to view lab/test results, encounter notes, upcoming appointments, etc.   Non-urgent messages can be sent to your provider as well.   To learn more about what you can do with MyChart, go to [email or web address].    Your next appointment:   After testing is completed   The format for your next appointment:   In Person  Provider:   Kate Sable, MD   Other Instructions N/a   Echocardiogram An echocardiogram is a procedure that uses painless sound waves (ultrasound) to produce an image of the heart. Images from an echocardiogram can provide important information about:  Signs of coronary artery disease (CAD).  Aneurysm detection. An aneurysm is a weak or damaged part of an artery wall that bulges out from the normal force of blood pumping through the body.  Heart size and shape. Changes in the size or shape of the heart can be associated with certain conditions, including heart failure, aneurysm, and CAD.  Heart muscle function.  Heart valve function.  Signs of a past heart attack.  Fluid buildup around the heart.  Thickening of the heart muscle.  A tumor or infectious growth around the heart valves. Tell a health care provider about:  Any allergies you have.  All medicines you are taking, including vitamins, herbs, eye drops, creams, and over-the-counter medicines.  Any blood disorders you have.  Any surgeries you have had.  Any medical conditions you have.  Whether you are pregnant or may be pregnant. What are the risks? Generally, this is a safe procedure. However, problems may occur, including:  Allergic reaction to dye (contrast) that may be used during the  procedure. What happens before the procedure? No specific preparation is needed. You may eat and drink normally. What happens during the procedure?   An IV tube may be inserted into one of your veins.  You may receive contrast through this tube. A contrast is an injection that improves the quality of the pictures from your heart.  A gel will be applied to your  chest.  A wand-like tool (transducer) will be moved over your chest. The gel will help to transmit the sound waves from the transducer.  The sound waves will harmlessly bounce off of your heart to allow the heart images to be captured in real-time motion. The images will be recorded on a computer. The procedure may vary among health care providers and hospitals. What happens after the procedure?  You may return to your normal, everyday life, including diet, activities, and medicines, unless your health care provider tells you not to do that. Summary  An echocardiogram is a procedure that uses painless sound waves (ultrasound) to produce an image of the heart.  Images from an echocardiogram can provide important information about the size and shape of your heart, heart muscle function, heart valve function, and fluid buildup around your heart.  You do not need to do anything to prepare before this procedure. You may eat and drink normally.  After the echocardiogram is completed, you may return to your normal, everyday life, unless your health care provider tells you not to do that. This information is not intended to replace advice given to you by your health care provider. Make sure you discuss any questions you have with your health care provider. Document Revised: 04/04/2019 Document Reviewed: 01/14/2017 Elsevier Patient Education  [place].

## 2020-03-31 ENCOUNTER — Encounter: Payer: Self-pay | Admitting: Internal Medicine

## 2020-03-31 ENCOUNTER — Telehealth: Payer: Self-pay | Admitting: *Deleted

## 2020-03-31 ENCOUNTER — Encounter
Admission: RE | Admit: 2020-03-31 | Discharge: 2020-03-31 | Disposition: A | Discharge status: Home / Self Care | Payer: Medicaid Other | Source: Ambulatory Visit | Attending: Obstetrics and Gynecology | Admitting: Obstetrics and Gynecology

## 2020-03-31 ENCOUNTER — Telehealth: Payer: Self-pay | Admitting: Cardiology

## 2020-03-31 ENCOUNTER — Other Ambulatory Visit: Payer: Self-pay

## 2020-03-31 HISTORY — DX: Cardiac arrhythmia, unspecified: I49.9

## 2020-03-31 LAB — CA 125: Cancer Antigen (CA) 125: 129 U/mL — ABNORMAL HIGH (ref 0.0–38.1)
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
## 2020-03-31 NOTE — Patient Instructions (Addendum)
Your procedure is scheduled on: 04-08-20 [facility] Report to Same Day Surgery 2nd floor medical mall [place] on the [facility] Entrance-take elevator on left to 2nd floor.  Check in with surgery information desk.) To find out your arrival time please call [phone] between 1PM - 3PM on 04-07-20 TUESDAY  Remember: Instructions that are not followed completely may result in serious medical risk, up to and including death, or upon the discretion of your surgeon and anesthesiologist your surgery may need to be rescheduled.    _x___ 1. Do not eat food after midnight the night before your procedure. NO GUM OR CANDY AFTER MIDNIGHT. You may drink clear liquids up to 2 hours before you are scheduled to arrive at the hospital for your procedure.  Do not drink clear liquids within 2 hours of your scheduled arrival to the hospital.  Clear liquids include  --Water or Apple juice without pulp  --Gatorade  --Black Coffee or Clear Tea (No milk, no creamers, do not add anything to the coffee or Tea   ____Ensure clear carbohydrate drink on the way to the hospital for bariatric patients  _X___Ensure clear carbohydrate drink-FINISH DRINK 2 HOURS PRIOR TO [place]    __x__ 2. No Alcohol for 24 hours before or after surgery.   __x__3. No Smoking or e-cigarettes for 24 prior to surgery.  Do not use any chewable tobacco products for at least 6 hour prior to surgery   ____  4. Bring all medications with you on the day of surgery if instructed.    __x__ 5. Notify your doctor if there is any change in your medical condition     (cold, fever, infections).    x___6. On the morning of surgery brush your teeth with toothpaste and water.  You may rinse your mouth with mouth wash if you wish.  Do not swallow any toothpaste or mouthwash.   Do not wear jewelry, make-up, hairpins, clips or nail polish.  Do not wear lotions, powders, or perfumes.   Do not shave 48 hours prior to surgery. Men may shave face and  neck.  Do not bring valuables to the hospital.    [facility] is not responsible for any belongings or valuables.               Contacts, dentures or bridgework may not be worn into surgery.  Leave your suitcase in the car. After surgery it may be brought to your room.  For patients admitted to the hospital, discharge time is determined by your treatment team.  _  Patients discharged the day of surgery will not be allowed to drive home.  You will need someone to drive you home and stay with you the night of your procedure.    Please read over the following fact sheets that you were given:   [facility] Preparing for Surgery   _x___ TAKE THE FOLLOWING MEDICATION THE MORNING OF SURGERY WITH A SMALL SIP OF WATER. These include:  1. PRILOSEC (OMEPRAZOLE)  2. METOPROLOL (LOPRESSOR)  3. CLARITIN (LORATADINE)  4. YOU MAY TAKE YOUR XANAX (ALPRAZOLAM) DAY OF SURGERY IF NEEDED  5. YOU MAY ALSO TAKE YOUR OXYCODONE OR TRAMADOL AM OF SURGERY IF NEEDED  6.  ____Fleets enema or Magnesium Citrate as directed.   _x___ Use CHG Soap or sage wipes as directed on instruction sheet   ____ Use inhalers on the day of surgery and bring to hospital day of surgery  ____ Stop Metformin and Janumet 2 days prior  to surgery.    ____ Take 1/2 of usual insulin dose the night before surgery and none on the morning surgery.   ____ Follow recommendations from Cardiologist, Pulmonologist or PCP regarding stopping Aspirin, Coumadin, Plavix ,Eliquis, Effient, or Pradaxa, and Pletal.  X____Stop Anti-inflammatories such as Advil, Aleve, Ibuprofen, Motrin, Naproxen, Naprosyn, Goodies powders or aspirin products NOW-OK to take Tylenol   _x___ Stop supplements until after surgery-STOP BIOTIN (HAIR, SKIN AND NAILS) NOW-MAY RESUME AFTER SURGERY   ____ Bring C-Pap to the hospital.

## 2020-03-31 NOTE — Telephone Encounter (Signed)
Per Dr. Rogue Bussing - contact radiology department. Requested that radiologist make an addendum to cxr report to determine if picc line needs to be adjusted.

## 2020-03-31 NOTE — Pre-Procedure Instructions (Addendum)
Called over to cancer center and spoke with Hassan Rowan about pt needing cardiac clearance and her echo prior  to surgery on 14th. I told her I tried to secure chat dr secord but that she was unavailable on secure chat.  Hassan Rowan told me to add Beckey Rutter PA to secure chat and she would be able to follow this. I faxed clearance request to cardiologist ([email or web address]) and to Dr Theora Gianotti at cancer center with fax confirmation confirmed on both

## 2020-03-31 NOTE — Pre-Procedure Instructions (Signed)
Secure chat with Dr Amie Critchley:  Pt with ovarian cancer having hysterectomy with [facility] and [facility] on 4-14. Pt dx with cardiomegaly recently and just saw cardiologist yesterday (note is in [place] from Dr [email or web address]. can you review note. He wants pt to have an echo but it is not scheduled until after her surgery on 4-26. Do we need this to be done prior to surgery along with cardiac clearance?   We would like the ECHO done and read before the surgery

## 2020-03-31 NOTE — Telephone Encounter (Signed)
   [facility] Pre-operative Risk Assessment    Request for surgical clearance:  1. What type of surgery is being performed? Robotic ? total hysterectomy   2. When is this surgery scheduled? 04/08/20   3. What type of clearance is required (medical clearance vs. Pharmacy clearance to hold med vs. Both)? both  4. Are there any medications that need to be held prior to surgery and how long? Not listed, please advise if needed.   5. Practice name and name of physician performing surgery? Dr Theora Gianotti  6. What is your office phone number  [phone]   7.   What is your office fax number [phone]  8.   Anesthesia type (None, local, MAC, general) ? Not listed   Ace Gins 03/31/2020, 4:21 PM  _________________________________________________________________   (provider comments below)

## 2020-03-31 NOTE — Telephone Encounter (Signed)
   Primary Cardiologist: Kate Sable, MD  Chart reviewed as part of pre-operative protocol coverage. Patient was seen in the office by Dr. Garen Lah 03/30/20 and recommended to undergo an echocardiogram (scheduled for 04/02/20). Will await these results prior to providing preop clearance.    Abigail Butts, PA-C 03/31/2020, 4:48 PM

## 2020-04-01 ENCOUNTER — Telehealth: Payer: Self-pay | Admitting: Internal Medicine

## 2020-04-01 NOTE — Telephone Encounter (Signed)
On 4/06-spoke to patient regarding IV access issues.  Discussed that after chemotherapy patient will likely be on maintenance PRP inhibitors /pills.  However she will still need labs drawn on a monthly basis.  Patient given poor IV access interested in Mediport.   Will discuss with gynecology oncology regarding coordinating port placement if possible at the time of surgery.  If not Mediport will have to be placed post surgery.   4/06-Chest x-ray-suggestive that PICC line needs to be adjusted-based on above decision.

## 2020-04-02 ENCOUNTER — Other Ambulatory Visit: Payer: Self-pay

## 2020-04-02 ENCOUNTER — Ambulatory Visit (INDEPENDENT_AMBULATORY_CARE_PROVIDER_SITE_OTHER): Payer: Medicaid Other

## 2020-04-02 DIAGNOSIS — I517 Cardiomegaly: Secondary | ICD-10-CM

## 2020-04-02 NOTE — Telephone Encounter (Signed)
Left VM to call back 

## 2020-04-02 NOTE — Telephone Encounter (Addendum)
Dr. Garen Lah, You recently saw this patient following cardiomegaly noted on CT scan. Her echo today showed a normal EF, no WMA, and no significant valvular disease. However, when I called her, she does complain of palpitations and DOE. She is unable to complete 4.0 METS without dyspnea. Can you please weigh in on whether she needs additional testing prior to cardiac clearance? Possible heart monitor +/- stress testing? Her surgery is scheduled for Wed. If she needs further testing, this may need to be pushed.   Thanks Angie   Addended to include Dr. Thereasa Solo response: "When I saw the patient she had no cardiac symptoms such as chest pain or shortness of breath. Her echocardiogram is normal. 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 Her echocardiogram is normal. I do not believe further cardiac testing will mitigate patient's risk. We can always plan for a stress test in the future if needed to work-up patient's shortness of breath. But doing that now we will simply prolong her surgical management. Either way we can plan for stress testing if the team feels strongly about doing so. "

## 2020-04-03 NOTE — Pre-Procedure Instructions (Signed)
Received cardiac clearance-on chart

## 2020-04-03 NOTE — Telephone Encounter (Signed)
   Primary Cardiologist: Kate Sable, MD  Chart reviewed as part of pre-operative protocol coverage. Patient was contacted 04/03/2020 in reference to pre-operative risk assessment for pending surgery as outlined below.  [name] was last seen on 03/30/20 by Dr. Garen Lah.  Since that day, [name] has done well. She had an echocardiogram for cardiomegaly seen on CT that showed normal EF, no WMA, and no significant valvular disease. She is unable to complete more than 4.0 METS without DOE. Etiology is unclear. In consultation with Dr. Garen Lah, we will proceed with surgery and plan for additional cardiac/pulmonary workup after she recovers from surgery. She understands that she is at risk for complications given her ongoing symptoms, which may be attributed to her chemotherapy or significant smoking history.   Therefore, based on ACC/AHA guidelines, the patient would be at acceptable risk for the planned procedure without further cardiovascular testing.   I will route this recommendation to the requesting party via Epic fax function and remove from pre-op pool.  Please call with questions.  Tami Lin Rane Dumm, PA 04/03/2020, 8:58 AM

## 2020-04-03 NOTE — Telephone Encounter (Signed)
Discussed port with Dr. Delana Meyer who advises that placing port at time of her surgery isn't recommended but he can place either before or after her surgery; he will also remove picc at time of port placement. Per Dr. Rogue Bussing, ok to wait until after surgery. Port to be used for administration of chemotherapy. Per Dr. Delana Meyer, patient does not need filter. Discussed recommendations with patient and she is agreeable to port placement. I reached out to [facility] who will contact patient to schedule.  Patient has received clearance from cardiology for upcoming surgery.

## 2020-04-06 ENCOUNTER — Other Ambulatory Visit: Payer: Self-pay

## 2020-04-06 ENCOUNTER — Other Ambulatory Visit: Payer: Medicaid Other

## 2020-04-06 ENCOUNTER — Ambulatory Visit
Admission: RE | Admit: 2020-04-06 | Discharge: 2020-04-06 | Disposition: A | Discharge status: Home / Self Care | Payer: Medicaid Other | Source: Ambulatory Visit | Attending: Obstetrics and Gynecology | Admitting: Obstetrics and Gynecology

## 2020-04-06 ENCOUNTER — Telehealth: Payer: Self-pay | Admitting: *Deleted

## 2020-04-06 ENCOUNTER — Ambulatory Visit: Payer: Medicaid Other | Admitting: Cardiology

## 2020-04-06 ENCOUNTER — Encounter
Admission: RE | Admit: 2020-04-06 | Discharge: 2020-04-06 | Disposition: A | Discharge status: Home / Self Care | Payer: Medicaid Other | Source: Ambulatory Visit | Attending: Obstetrics and Gynecology | Admitting: Obstetrics and Gynecology

## 2020-04-06 DIAGNOSIS — Z01812 Encounter for preprocedural laboratory examination: Secondary | ICD-10-CM | POA: Insufficient documentation

## 2020-04-06 DIAGNOSIS — Z20822 Contact with and (suspected) exposure to covid-19: Secondary | ICD-10-CM | POA: Diagnosis not present

## 2020-04-06 DIAGNOSIS — J90 Pleural effusion, not elsewhere classified: Secondary | ICD-10-CM

## 2020-04-06 DIAGNOSIS — C3490 Malignant neoplasm of unspecified part of unspecified bronchus or lung: Secondary | ICD-10-CM | POA: Diagnosis not present

## 2020-04-06 LAB — TYPE AND SCREEN
ABO/RH(D): B POS
Antibody Screen: NEGATIVE

## 2020-04-06 LAB — PROTIME-INR
INR: 1 (ref 0.8–1.2)
Prothrombin Time: 12.7 seconds (ref 11.4–15.2)

## 2020-04-06 LAB — APTT: aPTT: 27 seconds (ref 24–36)

## 2020-04-06 LAB — SARS CORONAVIRUS 2 (TAT 6-24 HRS): SARS Coronavirus 2: NEGATIVE

## 2020-04-06 NOTE — Telephone Encounter (Signed)
Returned call to patient. She inquired if her picc line dressing needed to be changed today. RN spoke with Dr. Rogue Bussing.  Pt is ok to wait until Wednesday's surgery date. picc dressing could be changed at that time per md.

## 2020-04-07 ENCOUNTER — Encounter: Payer: Self-pay | Admitting: Family Medicine

## 2020-04-08 ENCOUNTER — Inpatient Hospital Stay: Payer: Medicaid Other

## 2020-04-08 ENCOUNTER — Encounter
Admission: RE | Disposition: A | Discharge status: Home / Self Care | Payer: Self-pay | Source: Home / Self Care | Attending: Obstetrics and Gynecology

## 2020-04-08 ENCOUNTER — Encounter: Payer: Self-pay | Admitting: Obstetrics and Gynecology

## 2020-04-08 ENCOUNTER — Telehealth: Payer: Self-pay | Admitting: Internal Medicine

## 2020-04-08 ENCOUNTER — Inpatient Hospital Stay
Admission: RE | Admit: 2020-04-08 | Discharge: 2020-04-11 | DRG: 740 | Disposition: A | Discharge status: Home / Self Care | Payer: Medicaid Other | Attending: Obstetrics and Gynecology | Admitting: Obstetrics and Gynecology

## 2020-04-08 ENCOUNTER — Other Ambulatory Visit: Payer: Self-pay | Admitting: Family Medicine

## 2020-04-08 ENCOUNTER — Other Ambulatory Visit: Payer: Self-pay

## 2020-04-08 DIAGNOSIS — Z716 Tobacco abuse counseling: Secondary | ICD-10-CM

## 2020-04-08 DIAGNOSIS — Z9221 Personal history of antineoplastic chemotherapy: Secondary | ICD-10-CM

## 2020-04-08 DIAGNOSIS — C5702 Malignant neoplasm of left fallopian tube: Secondary | ICD-10-CM | POA: Diagnosis not present

## 2020-04-08 DIAGNOSIS — N736 Female pelvic peritoneal adhesions (postinfective): Secondary | ICD-10-CM | POA: Diagnosis present

## 2020-04-08 DIAGNOSIS — C801 Malignant (primary) neoplasm, unspecified: Secondary | ICD-10-CM | POA: Diagnosis not present

## 2020-04-08 DIAGNOSIS — C482 Malignant neoplasm of peritoneum, unspecified: Secondary | ICD-10-CM | POA: Diagnosis not present

## 2020-04-08 DIAGNOSIS — F17210 Nicotine dependence, cigarettes, uncomplicated: Secondary | ICD-10-CM | POA: Diagnosis present

## 2020-04-08 DIAGNOSIS — J90 Pleural effusion, not elsewhere classified: Secondary | ICD-10-CM | POA: Diagnosis present

## 2020-04-08 DIAGNOSIS — C577 Malignant neoplasm of other specified female genital organs: Principal | ICD-10-CM | POA: Diagnosis present

## 2020-04-08 DIAGNOSIS — Z7901 Long term (current) use of anticoagulants: Secondary | ICD-10-CM

## 2020-04-08 DIAGNOSIS — I10 Essential (primary) hypertension: Secondary | ICD-10-CM | POA: Diagnosis not present

## 2020-04-08 DIAGNOSIS — Z6841 Body Mass Index (BMI) 40.0 and over, adult: Secondary | ICD-10-CM | POA: Diagnosis not present

## 2020-04-08 DIAGNOSIS — C799 Secondary malignant neoplasm of unspecified site: Secondary | ICD-10-CM | POA: Diagnosis present

## 2020-04-08 DIAGNOSIS — C786 Secondary malignant neoplasm of retroperitoneum and peritoneum: Secondary | ICD-10-CM | POA: Diagnosis present

## 2020-04-08 DIAGNOSIS — K219 Gastro-esophageal reflux disease without esophagitis: Secondary | ICD-10-CM | POA: Diagnosis not present

## 2020-04-08 DIAGNOSIS — I471 Supraventricular tachycardia: Secondary | ICD-10-CM | POA: Diagnosis present

## 2020-04-08 DIAGNOSIS — Z1501 Genetic susceptibility to malignant neoplasm of breast: Secondary | ICD-10-CM

## 2020-04-08 DIAGNOSIS — M5126 Other intervertebral disc displacement, lumbar region: Secondary | ICD-10-CM | POA: Diagnosis present

## 2020-04-08 DIAGNOSIS — C561 Malignant neoplasm of right ovary: Secondary | ICD-10-CM | POA: Diagnosis not present

## 2020-04-08 DIAGNOSIS — Z30432 Encounter for removal of intrauterine contraceptive device: Secondary | ICD-10-CM

## 2020-04-08 DIAGNOSIS — C5701 Malignant neoplasm of right fallopian tube: Secondary | ICD-10-CM | POA: Diagnosis not present

## 2020-04-08 DIAGNOSIS — C562 Malignant neoplasm of left ovary: Secondary | ICD-10-CM | POA: Diagnosis not present

## 2020-04-08 HISTORY — PX: CYSTOSCOPY: SHX5120

## 2020-04-08 HISTORY — PX: IUD REMOVAL: SHX5392

## 2020-04-08 LAB — CREATININE, SERUM
Creatinine, Ser: 0.72 mg/dL (ref 0.44–1.00)
GFR calc Af Amer: >60 mL/min (ref >60)
GFR calc non Af Amer: >60 mL/min (ref >60)

## 2020-04-08 LAB — CBC
HCT: 37.5 % (ref 36.0–46.0)
Hemoglobin: 12.6 g/dL (ref 12.0–15.0)
MCH: 35.8 pg — ABNORMAL HIGH (ref 26.0–34.0)
MCHC: 33.6 g/dL (ref 30.0–36.0)
MCV: 106.5 fL — ABNORMAL HIGH (ref 80.0–100.0)
Platelets: 235 10*3/uL (ref 150–400)
RBC: 3.52 MIL/uL — ABNORMAL LOW (ref 3.87–5.11)
RDW: 16.9 % — ABNORMAL HIGH (ref 11.5–15.5)
WBC: 13 10*3/uL — ABNORMAL HIGH (ref 4.0–10.5)
nRBC: 0 % (ref 0.0–0.2)

## 2020-04-08 LAB — POCT PREGNANCY, URINE: Preg Test, Ur: NEGATIVE

## 2020-04-08 LAB — ABO/RH: ABO/RH(D): B POS

## 2020-04-08 SURGERY — XI ROBOTIC ASSISTED TOTAL HYSTERECTOMY BILATERAL SALPINGO OOPHORECTOMY WITH OMENTECTOMY AND DEBULKING
Anesthesia: General | Site: Uterus

## 2020-04-08 MED ORDER — SODIUM CHLORIDE FLUSH 0.9 % IV SOLN
INTRAVENOUS | Status: AC
  Filled 2020-04-08: qty 10

## 2020-04-08 MED ORDER — FENTANYL CITRATE (PF) 100 MCG/2ML IJ SOLN
INTRAMUSCULAR | Status: DC | PRN
  Administered 2020-04-08 (×3): 25 ug via INTRAVENOUS
  Administered 2020-04-08: 50 ug via INTRAVENOUS
  Administered 2020-04-08: 25 ug via INTRAVENOUS
  Administered 2020-04-08: 50 ug via INTRAVENOUS

## 2020-04-08 MED ORDER — GABAPENTIN 300 MG PO CAPS
ORAL_CAPSULE | ORAL | Status: AC
  Administered 2020-04-08: 600 mg via ORAL
  Filled 2020-04-08: qty 2

## 2020-04-08 MED ORDER — FENTANYL CITRATE (PF) 100 MCG/2ML IJ SOLN
INTRAMUSCULAR | Status: AC
  Filled 2020-04-08: qty 2

## 2020-04-08 MED ORDER — ALPRAZOLAM 0.5 MG PO TABS: 0.2500 mg | ORAL_TABLET | Freq: Two times a day (BID) | ORAL | Status: DC | PRN

## 2020-04-08 MED ORDER — BUPIVACAINE HCL (PF) 0.5 % IJ SOLN
INTRAMUSCULAR | Status: AC
  Filled 2020-04-08: qty 30

## 2020-04-08 MED ORDER — PHENYLEPHRINE HCL (PRESSORS) 10 MG/ML IV SOLN
INTRAVENOUS | Status: AC
  Filled 2020-04-08: qty 1

## 2020-04-08 MED ORDER — ONDANSETRON HCL 4 MG PO TABS: 8.0000 mg | ORAL_TABLET | Freq: Three times a day (TID) | ORAL | Status: DC | PRN

## 2020-04-08 MED ORDER — HYDROMORPHONE 1 MG/ML IV SOLN
INTRAVENOUS | Status: DC
  Filled 2020-04-08 (×2): qty 30

## 2020-04-08 MED ORDER — CELECOXIB 200 MG PO CAPS: 400.0000 mg | ORAL_CAPSULE | ORAL | Status: AC

## 2020-04-08 MED ORDER — DIPHENHYDRAMINE HCL 50 MG/ML IJ SOLN: 12.5000 mg | Freq: Four times a day (QID) | INTRAMUSCULAR | Status: DC | PRN

## 2020-04-08 MED ORDER — BUPIVACAINE LIPOSOME 1.3 % IJ SUSP
INTRAMUSCULAR | Status: DC | PRN
  Administered 2020-04-08: 20 mL

## 2020-04-08 MED ORDER — LACTATED RINGERS IV SOLN: INTRAVENOUS | Status: DC

## 2020-04-08 MED ORDER — HEPARIN SODIUM (PORCINE) 5000 UNIT/ML IJ SOLN: 5000.0000 [IU] | INTRAMUSCULAR | Status: AC

## 2020-04-08 MED ORDER — METHYLENE BLUE 0.5 % INJ SOLN
INTRAVENOUS | Status: AC
  Filled 2020-04-08: qty 10

## 2020-04-08 MED ORDER — DULOXETINE HCL 60 MG PO CPEP
60.0000 mg | ORAL_CAPSULE | Freq: Every day | ORAL | Status: DC
  Administered 2020-04-08 – 2020-04-10 (×3): 60 mg via ORAL
  Filled 2020-04-08 (×4): qty 1

## 2020-04-08 MED ORDER — CEFAZOLIN SODIUM 1 G IJ SOLR
INTRAMUSCULAR | Status: AC
  Filled 2020-04-08: qty 20

## 2020-04-08 MED ORDER — BUPIVACAINE HCL 0.5 % IJ SOLN
INTRAMUSCULAR | Status: DC | PRN
  Administered 2020-04-08: 10 mL

## 2020-04-08 MED ORDER — PROPOFOL 10 MG/ML IV BOLUS
INTRAVENOUS | Status: DC | PRN
  Administered 2020-04-08: 170 mg via INTRAVENOUS

## 2020-04-08 MED ORDER — FENTANYL CITRATE (PF) 100 MCG/2ML IJ SOLN
25.0000 ug | INTRAMUSCULAR | Status: DC | PRN
  Administered 2020-04-08 (×2): 50 ug via INTRAVENOUS

## 2020-04-08 MED ORDER — PROMETHAZINE HCL 25 MG/ML IJ SOLN: 6.2500 mg | INTRAMUSCULAR | Status: DC | PRN

## 2020-04-08 MED ORDER — CELECOXIB 200 MG PO CAPS
ORAL_CAPSULE | ORAL | Status: AC
  Administered 2020-04-08: 400 mg via ORAL
  Filled 2020-04-08: qty 2

## 2020-04-08 MED ORDER — GABAPENTIN 300 MG PO CAPS: 600.0000 mg | ORAL_CAPSULE | ORAL | Status: AC

## 2020-04-08 MED ORDER — ROCURONIUM BROMIDE 10 MG/ML (PF) SYRINGE
PREFILLED_SYRINGE | INTRAVENOUS | Status: AC
  Filled 2020-04-08: qty 10

## 2020-04-08 MED ORDER — SENNOSIDES-DOCUSATE SODIUM 8.6-50 MG PO TABS
1.0000 | ORAL_TABLET | Freq: Every day | ORAL | Status: DC
  Administered 2020-04-08 – 2020-04-10 (×3): 1 via ORAL
  Filled 2020-04-08 (×3): qty 1

## 2020-04-08 MED ORDER — MENTHOL 3 MG MT LOZG
1.0000 | LOZENGE | OROMUCOSAL | Status: DC | PRN
  Filled 2020-04-08: qty 9

## 2020-04-08 MED ORDER — LORATADINE 10 MG PO TABS
10.0000 mg | ORAL_TABLET | ORAL | Status: DC
  Administered 2020-04-09 – 2020-04-11 (×3): 10 mg via ORAL
  Filled 2020-04-08 (×4): qty 1

## 2020-04-08 MED ORDER — NALOXONE HCL 0.4 MG/ML IJ SOLN: 0.4000 mg | INTRAMUSCULAR | Status: DC | PRN

## 2020-04-08 MED ORDER — DEXAMETHASONE SODIUM PHOSPHATE 10 MG/ML IJ SOLN
INTRAMUSCULAR | Status: AC
  Filled 2020-04-08: qty 1

## 2020-04-08 MED ORDER — LACTATED RINGERS IV SOLN: INTRAVENOUS | Status: DC | PRN

## 2020-04-08 MED ORDER — PANTOPRAZOLE SODIUM 40 MG PO TBEC
40.0000 mg | DELAYED_RELEASE_TABLET | Freq: Every day | ORAL | Status: DC
  Administered 2020-04-09 – 2020-04-10 (×2): 40 mg via ORAL
  Filled 2020-04-08 (×3): qty 1

## 2020-04-08 MED ORDER — ACETAMINOPHEN 500 MG PO TABS
ORAL_TABLET | ORAL | Status: AC
  Administered 2020-04-08: 1000 mg via ORAL
  Filled 2020-04-08: qty 2

## 2020-04-08 MED ORDER — HEPARIN SODIUM (PORCINE) 5000 UNIT/ML IJ SOLN
INTRAMUSCULAR | Status: AC
  Administered 2020-04-08: 5000 [IU] via SUBCUTANEOUS
  Filled 2020-04-08: qty 1

## 2020-04-08 MED ORDER — SUGAMMADEX SODIUM 200 MG/2ML IV SOLN
INTRAVENOUS | Status: DC | PRN
  Administered 2020-04-08 (×2): 100 mg via INTRAVENOUS

## 2020-04-08 MED ORDER — ONDANSETRON HCL 4 MG/2ML IJ SOLN: 4.0000 mg | Freq: Four times a day (QID) | INTRAMUSCULAR | Status: DC | PRN

## 2020-04-08 MED ORDER — FLUORESCEIN SODIUM 10 % IV SOLN
INTRAVENOUS | Status: AC
  Filled 2020-04-08: qty 5

## 2020-04-08 MED ORDER — QUETIAPINE FUMARATE 25 MG PO TABS
25.0000 mg | ORAL_TABLET | Freq: Every day | ORAL | Status: DC
  Filled 2020-04-08 (×4): qty 1

## 2020-04-08 MED ORDER — LIDOCAINE HCL (CARDIAC) PF 100 MG/5ML IV SOSY
PREFILLED_SYRINGE | INTRAVENOUS | Status: DC | PRN
  Administered 2020-04-08: 100 mg via INTRAVENOUS

## 2020-04-08 MED ORDER — OXYCODONE HCL 5 MG PO TABS
5.0000 mg | ORAL_TABLET | Freq: Three times a day (TID) | ORAL | Status: DC | PRN
  Administered 2020-04-09: 10 mg via ORAL
  Filled 2020-04-08: qty 2

## 2020-04-08 MED ORDER — ENSURE PRE-SURGERY PO LIQD
592.0000 mL | Freq: Once | ORAL | Status: AC
  Administered 2020-04-08: 592 mL via ORAL
  Filled 2020-04-08: qty 592

## 2020-04-08 MED ORDER — MIDAZOLAM HCL 2 MG/2ML IJ SOLN
INTRAMUSCULAR | Status: AC
  Filled 2020-04-08: qty 2

## 2020-04-08 MED ORDER — ROCURONIUM BROMIDE 100 MG/10ML IV SOLN
INTRAVENOUS | Status: DC | PRN
  Administered 2020-04-08: 20 mg via INTRAVENOUS
  Administered 2020-04-08: 10 mg via INTRAVENOUS
  Administered 2020-04-08: 60 mg via INTRAVENOUS
  Administered 2020-04-08 (×2): 10 mg via INTRAVENOUS
  Administered 2020-04-08: 30 mg via INTRAVENOUS
  Administered 2020-04-08: 20 mg via INTRAVENOUS

## 2020-04-08 MED ORDER — HYDROMORPHONE 1 MG/ML IV SOLN
INTRAVENOUS | Status: DC
  Administered 2020-04-08: 25 mg via INTRAVENOUS
  Filled 2020-04-08: qty 25

## 2020-04-08 MED ORDER — DOCUSATE SODIUM 100 MG PO CAPS
100.0000 mg | ORAL_CAPSULE | Freq: Two times a day (BID) | ORAL | Status: DC
  Administered 2020-04-08 – 2020-04-10 (×5): 100 mg via ORAL
  Filled 2020-04-08 (×6): qty 1

## 2020-04-08 MED ORDER — SUMATRIPTAN SUCCINATE 50 MG PO TABS
100.0000 mg | ORAL_TABLET | ORAL | Status: DC | PRN
  Filled 2020-04-08: qty 2

## 2020-04-08 MED ORDER — TOPIRAMATE 100 MG PO TABS
100.0000 mg | ORAL_TABLET | Freq: Every day | ORAL | Status: DC
  Filled 2020-04-08 (×4): qty 1

## 2020-04-08 MED ORDER — SODIUM CHLORIDE 0.9 % IV SOLN
INTRAVENOUS | Status: DC | PRN
  Administered 2020-04-08: 50 ug/min via INTRAVENOUS

## 2020-04-08 MED ORDER — SOD CITRATE-CITRIC ACID 500-334 MG/5ML PO SOLN
30.0000 mL | ORAL | Status: DC
  Filled 2020-04-08: qty 30

## 2020-04-08 MED ORDER — METOPROLOL TARTRATE 25 MG PO TABS
12.5000 mg | ORAL_TABLET | Freq: Two times a day (BID) | ORAL | Status: DC
  Administered 2020-04-08 – 2020-04-10 (×3): 12.5 mg via ORAL
  Filled 2020-04-08 (×3): qty 0.5

## 2020-04-08 MED ORDER — MIDAZOLAM HCL 2 MG/2ML IJ SOLN
INTRAMUSCULAR | Status: DC | PRN
  Administered 2020-04-08: 2 mg via INTRAVENOUS

## 2020-04-08 MED ORDER — DEXAMETHASONE SODIUM PHOSPHATE 10 MG/ML IJ SOLN
INTRAMUSCULAR | Status: DC | PRN
  Administered 2020-04-08: 5 mg via INTRAVENOUS

## 2020-04-08 MED ORDER — EPHEDRINE SULFATE 50 MG/ML IJ SOLN
INTRAMUSCULAR | Status: DC | PRN
  Administered 2020-04-08: 10 mg via INTRAVENOUS

## 2020-04-08 MED ORDER — SODIUM CHLORIDE 0.9% FLUSH: 9.0000 mL | INTRAVENOUS | Status: DC | PRN

## 2020-04-08 MED ORDER — ONDANSETRON HCL 4 MG/2ML IJ SOLN
INTRAMUSCULAR | Status: AC
  Filled 2020-04-08: qty 2

## 2020-04-08 MED ORDER — PROPOFOL 10 MG/ML IV BOLUS
INTRAVENOUS | Status: AC
  Filled 2020-04-08: qty 40

## 2020-04-08 MED ORDER — NICOTINE 14 MG/24HR TD PT24: 14.0000 mg | MEDICATED_PATCH | Freq: Every day | TRANSDERMAL | 0 refills | Status: DC

## 2020-04-08 MED ORDER — ACETAMINOPHEN 500 MG PO TABS: 1000.0000 mg | ORAL_TABLET | ORAL | Status: AC

## 2020-04-08 MED ORDER — DEXAMETHASONE SODIUM PHOSPHATE 10 MG/ML IJ SOLN: 4.0000 mg | INTRAMUSCULAR | Status: AC

## 2020-04-08 MED ORDER — DEXAMETHASONE SODIUM PHOSPHATE 10 MG/ML IJ SOLN
INTRAMUSCULAR | Status: AC
  Administered 2020-04-08: 4 mg via INTRAVENOUS
  Filled 2020-04-08: qty 1

## 2020-04-08 MED ORDER — ADULT MULTIVITAMIN W/MINERALS CH
1.0000 | ORAL_TABLET | Freq: Every day | ORAL | Status: DC
  Administered 2020-04-09 – 2020-04-11 (×3): 1 via ORAL
  Filled 2020-04-08 (×3): qty 1

## 2020-04-08 MED ORDER — SCOPOLAMINE 1 MG/3DAYS TD PT72
MEDICATED_PATCH | TRANSDERMAL | Status: AC
  Administered 2020-04-08: 1.5 mg via TRANSDERMAL
  Filled 2020-04-08: qty 1

## 2020-04-08 MED ORDER — DEXMEDETOMIDINE HCL IN NACL 80 MCG/20ML IV SOLN
INTRAVENOUS | Status: AC
  Filled 2020-04-08: qty 20

## 2020-04-08 MED ORDER — DIPHENHYDRAMINE HCL 12.5 MG/5ML PO ELIX
12.5000 mg | ORAL_SOLUTION | Freq: Four times a day (QID) | ORAL | Status: DC | PRN
  Filled 2020-04-08: qty 5

## 2020-04-08 MED ORDER — SODIUM CHLORIDE (PF) 0.9 % IJ SOLN
INTRAMUSCULAR | Status: AC
  Filled 2020-04-08: qty 10

## 2020-04-08 MED ORDER — NICOTINE POLACRILEX 2 MG MT GUM
2.0000 mg | CHEWING_GUM | OROMUCOSAL | Status: DC
  Administered 2020-04-08 – 2020-04-09 (×2): 2 mg via ORAL
  Filled 2020-04-08 (×18): qty 1

## 2020-04-08 MED ORDER — CEFAZOLIN SODIUM-DEXTROSE 2-4 GM/100ML-% IV SOLN
2.0000 g | INTRAVENOUS | Status: AC
  Administered 2020-04-08 (×2): 2 g via INTRAVENOUS

## 2020-04-08 MED ORDER — HYDROMORPHONE HCL 1 MG/ML IJ SOLN
1.0000 mg | Freq: Once | INTRAMUSCULAR | Status: AC
  Administered 2020-04-08: 1 mg via INTRAVENOUS

## 2020-04-08 MED ORDER — SCOPOLAMINE 1 MG/3DAYS TD PT72: 1.0000 | MEDICATED_PATCH | TRANSDERMAL | Status: DC

## 2020-04-08 MED ORDER — ONDANSETRON HCL 4 MG/2ML IJ SOLN
INTRAMUSCULAR | Status: DC | PRN
  Administered 2020-04-08: 4 mg via INTRAVENOUS

## 2020-04-08 MED ORDER — HYDROMORPHONE HCL 1 MG/ML IJ SOLN
INTRAMUSCULAR | Status: AC
  Filled 2020-04-08: qty 1

## 2020-04-08 MED ORDER — CEFAZOLIN SODIUM-DEXTROSE 2-4 GM/100ML-% IV SOLN
INTRAVENOUS | Status: AC
  Filled 2020-04-08: qty 100

## 2020-04-08 MED ORDER — ENSURE PRE-SURGERY PO LIQD
296.0000 mL | Freq: Once | ORAL | Status: DC
  Filled 2020-04-08: qty 296

## 2020-04-08 MED ORDER — BUPIVACAINE LIPOSOME 1.3 % IJ SUSP
INTRAMUSCULAR | Status: AC
  Filled 2020-04-08: qty 20

## 2020-04-08 MED ORDER — DEXMEDETOMIDINE HCL 200 MCG/2ML IV SOLN
INTRAVENOUS | Status: DC | PRN
  Administered 2020-04-08 (×2): 8 ug via INTRAVENOUS

## 2020-04-08 MED ORDER — PHENYLEPHRINE HCL (PRESSORS) 10 MG/ML IV SOLN
INTRAVENOUS | Status: DC | PRN
  Administered 2020-04-08 (×2): 100 ug via INTRAVENOUS
  Administered 2020-04-08: 150 ug via INTRAVENOUS
  Administered 2020-04-08 (×2): 100 ug via INTRAVENOUS
  Administered 2020-04-08: 150 ug via INTRAVENOUS
  Administered 2020-04-08: 200 ug via INTRAVENOUS
  Administered 2020-04-08: 100 ug via INTRAVENOUS
  Administered 2020-04-08: 150 ug via INTRAVENOUS

## 2020-04-08 MED ORDER — ENOXAPARIN SODIUM 60 MG/0.6ML ~~LOC~~ SOLN
60.0000 mg | Freq: Two times a day (BID) | SUBCUTANEOUS | Status: DC
  Administered 2020-04-09 – 2020-04-11 (×4): 60 mg via SUBCUTANEOUS
  Filled 2020-04-08 (×6): qty 0.6

## 2020-04-08 SURGICAL SUPPLY — 87 items
ADH SKN CLS APL DERMABOND .7 (GAUZE/BANDAGES/DRESSINGS) ×6
ANCHOR TIS RET SYS 1550ML (BAG) IMPLANT
ANCHOR TIS RET SYS 235ML (MISCELLANEOUS) ×3 IMPLANT
APL PRP STRL LF DISP 70% ISPRP (MISCELLANEOUS) ×6
BAG DRN RND TRDRP ANRFLXCHMBR (UROLOGICAL SUPPLIES) ×3
BAG SPEC RTRVL C1550 25.4 (BAG)
BAG TISS RTRVL C235 10X14 (MISCELLANEOUS) ×3
BAG URINE DRAIN 2000ML AR STRL (UROLOGICAL SUPPLIES) ×4 IMPLANT
BLADE SURG SZ11 CARB STEEL (BLADE) ×5 IMPLANT
CANISTER SUCT 1200ML W/VALVE (MISCELLANEOUS) ×4 IMPLANT
CANNULA SEALS 8.5MM (CANNULA) ×12
CATH FOLEY 2WAY  5CC 16FR (CATHETERS) ×4
CATH FOLEY 2WAY 5CC 16FR (CATHETERS) ×3
CATH URTH 16FR FL 2W BLN LF (CATHETERS) ×3 IMPLANT
CHLORAPREP W/TINT 26 (MISCELLANEOUS) ×7 IMPLANT
CORD BIP STRL DISP 12FT (MISCELLANEOUS) ×4 IMPLANT
CORD MONOPOLAR M/FML 12FT (MISCELLANEOUS) ×2 IMPLANT
COVER TIP SHEARS 8 DVNC (MISCELLANEOUS) ×4 IMPLANT
COVER TIP SHEARS 8MM DA VINCI (MISCELLANEOUS) ×8
COVER WAND RF STERILE (DRAPES) ×4 IMPLANT
DEFOGGER SCOPE WARMER CLEARIFY (MISCELLANEOUS) ×4 IMPLANT
DERMABOND ADVANCED (GAUZE/BANDAGES/DRESSINGS) ×2
DERMABOND ADVANCED .7 DNX12 (GAUZE/BANDAGES/DRESSINGS) ×4 IMPLANT
DRAPE 3/4 80X56 (DRAPES) ×2 IMPLANT
DRAPE ARM DVNC X/XI (DISPOSABLE) ×9 IMPLANT
DRAPE COLUMN DVNC XI (DISPOSABLE) ×3 IMPLANT
DRAPE DA VINCI XI ARM (DISPOSABLE) ×12
DRAPE DA VINCI XI COLUMN (DISPOSABLE) ×4
DRAPE LEGGINS SURG 28X43 STRL (DRAPES) ×4 IMPLANT
DRESSING SURGICEL FIBRLLR 1X2 (HEMOSTASIS) ×3 IMPLANT
DRSG SURGICEL FIBRILLAR 1X2 (HEMOSTASIS) ×4
ELECT REM PT RETURN 9FT ADLT (ELECTROSURGICAL) ×4
ELECTRODE REM PT RTRN 9FT ADLT (ELECTROSURGICAL) ×3 IMPLANT
GLOVE BIO SURGEON STRL SZ 6.5 (GLOVE) ×24 IMPLANT
GLOVE INDICATOR 7.0 STRL GRN (GLOVE) ×49 IMPLANT
GOWN STRL REUS W/ TWL LRG LVL3 (GOWN DISPOSABLE) ×18 IMPLANT
GOWN STRL REUS W/TWL LRG LVL3 (GOWN DISPOSABLE) ×24
GRASPER SUT TROCAR 14GX15 (MISCELLANEOUS) IMPLANT
HANDLE YANKAUER SUCT BULB TIP (MISCELLANEOUS) ×1 IMPLANT
IRRIGATION STRYKERFLOW (MISCELLANEOUS) ×1 IMPLANT
IRRIGATOR STRYKERFLOW (MISCELLANEOUS) ×4
IV NS 1000ML (IV SOLUTION)
IV NS 1000ML BAXH (IV SOLUTION) IMPLANT
KIT IMAGING PINPOINTPAQ (MISCELLANEOUS) IMPLANT
KIT PINK PAD W/HEAD ARE REST (MISCELLANEOUS) ×4
KIT PINK PAD W/HEAD ARM REST (MISCELLANEOUS) ×3 IMPLANT
KIT TURNOVER CYSTO (KITS) ×4 IMPLANT
LABEL OR SOLS (LABEL) ×1 IMPLANT
LIGASURE VESSEL 5MM BLUNT TIP (ELECTROSURGICAL) ×4 IMPLANT
MANIPULATOR VCARE LG CRV RETR (MISCELLANEOUS) IMPLANT
MANIPULATOR VCARE SML CRV RETR (MISCELLANEOUS) IMPLANT
MANIPULATOR VCARE STD CRV RETR (MISCELLANEOUS) ×6 IMPLANT
NDL INSUFF ACCESS 14 VERSASTEP (NEEDLE) ×4 IMPLANT
NEEDLE HYPO 22GX1.5 SAFETY (NEEDLE) ×4 IMPLANT
NEEDLE SPNL 22GX3.5 QUINCKE BK (NEEDLE) ×4 IMPLANT
NEEDLE SPNL 22GX5 LNG QUINC BK (NEEDLE) IMPLANT
NS IRRIG 1000ML POUR BTL (IV SOLUTION) ×4 IMPLANT
NS IRRIG 500ML POUR BTL (IV SOLUTION) ×4 IMPLANT
OBTURATOR OPTICAL STANDARD 8MM (TROCAR) ×4
OBTURATOR OPTICAL STND 8 DVNC (TROCAR) ×3
OBTURATOR OPTICALSTD 8 DVNC (TROCAR) ×3 IMPLANT
OCCLUDER COLPOPNEUMO (BALLOONS) ×4 IMPLANT
PACK GYN LAPAROSCOPIC (MISCELLANEOUS) ×4 IMPLANT
PAD OB MATERNITY 4.3X12.25 (PERSONAL CARE ITEMS) ×4 IMPLANT
PAD PREP 24X41 OB/GYN DISP (PERSONAL CARE ITEMS) ×4 IMPLANT
PENCIL ELECTRO HAND CTR (MISCELLANEOUS) ×4 IMPLANT
PORT ACCESS TROCAR AIRSEAL 5 (TROCAR) ×4 IMPLANT
SEAL CANN 8.5 DVNC (CANNULA) ×9 IMPLANT
SET CYSTO W/LG BORE CLAMP LF (SET/KITS/TRAYS/PACK) IMPLANT
SET TRI-LUMEN FLTR TB AIRSEAL (TUBING) ×4 IMPLANT
SLEEVE VERSASTEP EXPAND ONEST (MISCELLANEOUS) ×4 IMPLANT
SOLUTION ELECTROLUBE (MISCELLANEOUS) ×4 IMPLANT
SUT DVC VLOC 180 0 12IN GS21 (SUTURE) ×4
SUT MAXON ABS #0 GS21 30IN (SUTURE) ×6 IMPLANT
SUT MNCRL AB 4-0 PS2 18 (SUTURE) ×6 IMPLANT
SUT PLAIN 3 0 SH 27IN (SUTURE) ×2 IMPLANT
SUT VIC AB 1 CT1 36 (SUTURE) ×8 IMPLANT
SUT VICRYL 0 AB UR-6 (SUTURE) ×5 IMPLANT
SUTURE DVC VLC 180 0 12IN GS21 (SUTURE) ×3 IMPLANT
SYR 3ML LL SCALE MARK (SYRINGE) IMPLANT
SYR 50ML LL SCALE MARK (SYRINGE) ×4 IMPLANT
SYR BULB IRRIG 60ML STRL (SYRINGE) ×4 IMPLANT
SYS LAPSCP GELPORT 120MM (MISCELLANEOUS) ×4
SYSTEM LAPSCP GELPORT 120MM (MISCELLANEOUS) ×3 IMPLANT
TROCAR BALLN GELPORT 12X130M (ENDOMECHANICALS) ×4 IMPLANT
TROCAR SL VERSASTEP 5M LG  B (MISCELLANEOUS) ×4
TROCAR SL VERSASTEP 5M LG B (MISCELLANEOUS) ×3 IMPLANT

## 2020-04-08 NOTE — Anesthesia Preprocedure Evaluation (Addendum)
Anesthesia Evaluation  Patient identified by MRN, date of birth, ID band Patient awake    Reviewed: Allergy & Precautions, H&P , NPO status , Patient's Chart, lab work & pertinent test results, reviewed documented beta blocker date and time   History of Anesthesia Complications Negative for: history of anesthetic complications  Airway Mallampati: II  TM Distance: >3 FB Neck ROM: full    Dental  (+) Dental Advidsory Given   Pulmonary neg shortness of breath, neg sleep apnea, neg COPD, neg recent URI, Current Smoker,    Pulmonary exam normal breath sounds clear to auscultation       Cardiovascular Exercise Tolerance: Good hypertension, (-) angina(-) Past MI and (-) Cardiac Stents Normal cardiovascular exam+ dysrhythmias (-) Valvular Problems/Murmurs Rhythm:regular Rate:Normal     Neuro/Psych  Headaches, neg Seizures PSYCHIATRIC DISORDERS Depression    GI/Hepatic Neg liver ROS, GERD  ,  Endo/Other  neg diabetesMorbid obesity  Renal/GU negative Renal ROS  negative genitourinary   Musculoskeletal   Abdominal   Peds  Hematology negative hematology ROS (+)   Anesthesia Other Findings Past Medical History: 06/18/2018: BRCA1 positive     Comment:  Pathogenic BRCA1 mutation at [place] 12/11/2019: 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 No date: Dysrhythmia No date: Family history of breast cancer No date: GERD (gastroesophageal reflux disease) No date: Hypertension No date: Menorrhagia No date: Migraines No date: Osteoarthritis     Comment:  back 12/10/2019: Ovarian cancer ([place]) No date: Personal history of chemotherapy     Comment:  ovarian cancer No date: Plantar fasciitis   Reproductive/Obstetrics negative OB ROS                              Anesthesia Physical Anesthesia Plan  ASA: III  Anesthesia Plan: General   Post-op Pain Management:    Induction: Intravenous  PONV Risk Score and Plan: 2 and Ondansetron, Dexamethasone, Midazolam, Scopolamine patch - Pre-op and Promethazine  Airway Management Planned: Oral ETT  Additional Equipment:   Intra-op Plan:   Post-operative Plan: Extubation in OR  Informed Consent: I have reviewed the patients History and Physical, chart, labs and discussed the procedure including the risks, benefits and alternatives for the proposed anesthesia with the patient or authorized representative who has indicated his/her understanding and acceptance.     Dental Advisory Given  Plan Discussed with: Anesthesiologist, CRNA and Surgeon  Anesthesia Plan Comments:        Anesthesia Quick Evaluation

## 2020-04-08 NOTE — OR Nursing (Signed)
PICC line occluded , will remove in post-op

## 2020-04-08 NOTE — OR Nursing (Signed)
IUD removed by dr secord in [place].

## 2020-04-08 NOTE — Progress Notes (Signed)
Day of Surgery Procedure(s): XI ROBOTIC ASSISTED TOTAL HYSTERECTOMY BILATERAL SALPINGO OOPHORECTOMY WITH OMENTECTOMY AND DEBULKING.   (N/A) INTRAUTERINE DEVICE (IUD) REMOVAL (N/A) CYSTOSCOPY (N/A) Subjective: Pain is adequately controlled. No SOB or CP. Resting quietly. PCA in place. Foley with clear urine.  Objective: Vital signs in last 24 hours: Temp:  [97 F (36.1 C)-98.1 F (36.7 C)] 98 F (36.7 C) (04/14 1938) Pulse Rate:  [69-88] 77 (04/14 1938) Resp:  [11-25] 14 (04/14 2003) BP: (100-134)/(62-83) 116/83 (04/14 1938) SpO2:  [90 %-100 %] 94 % (04/14 2015)  Intake/Output  Intake/Output Summary (Last 24 hours) at 04/08/2020 2058 Last data filed at 04/08/2020 1827 Gross per 24 hour  Intake 1740 ml  Output 950 ml  Net 790 ml    Physical Exam:  General: Alert and oriented. CV: RRR Lungs: Clear bilaterally. GI: Soft, Nondistended. Incisions: Clean and dry. Urine: Clear Extremities: Nontender, no erythema, no edema.  Assessment/Plan: POD# 0 s/p Procedure(s): XI ROBOTIC ASSISTED TOTAL HYSTERECTOMY BILATERAL SALPINGO OOPHORECTOMY WITH OMENTECTOMY AND DEBULKING.   (N/A) INTRAUTERINE DEVICE (IUD) REMOVAL (N/A) CYSTOSCOPY (N/A).  1) Encourage Incentive spirometry 2) Advance diet as tolerated, but may do clears overnight 3) Continue PCA 4) OOB to chair for 4 hours this afternoon   Benjaman Kindler, MD   LOS: 1 day   Benjaman Kindler 04/08/2020, 8:58 PM

## 2020-04-08 NOTE — Anesthesia Procedure Notes (Addendum)
Procedure Name: Intubation Date/Time: 04/08/2020 7:42 AM Performed by: Lowry Bowl, CRNA Pre-anesthesia Checklist: Patient identified, Emergency Drugs available, Suction available and Patient being monitored Patient Re-evaluated:Patient Re-evaluated prior to induction Oxygen Delivery Method: Circle system utilized Preoxygenation: Pre-oxygenation with 100% oxygen Induction Type: IV induction Ventilation: Mask ventilation without difficulty Laryngoscope Size: Mac and 4 Grade View: Grade I Tube type: Oral Tube size: 7.5 mm Number of attempts: 1 Airway Equipment and Method: Stylet Placement Confirmation: ETT inserted through vocal cords under direct vision,  positive ETCO2 and breath sounds checked- equal and bilateral Secured at: 21 cm Tube secured with: Tape Dental Injury: Teeth and Oropharynx as per pre-operative assessment  Comments: Placed by SRNA A. Boyce Medici

## 2020-04-08 NOTE — OR Nursing (Signed)
Dr. Rosey Bath made aware that pt has 22g in LFT index finger prior to surgery. PT PICC line was occluded and pt stuck multiple times.

## 2020-04-08 NOTE — Transfer of Care (Signed)
Immediate Anesthesia Transfer of Care Note  Patient: [name]  Procedure(s) Performed: XI ROBOTIC ASSISTED TOTAL HYSTERECTOMY BILATERAL SALPINGO OOPHORECTOMY WITH OMENTECTOMY AND DEBULKING.   (N/A Abdomen) INTRAUTERINE DEVICE (IUD) REMOVAL (N/A Uterus) CYSTOSCOPY (N/A Bladder)  Patient Location: PACU  Anesthesia Type:General  Level of Consciousness: awake, oriented, drowsy and patient cooperative  Airway & Oxygen Therapy: Patient Spontanous Breathing and Patient connected to face mask oxygen  Post-op Assessment: Report given to RN and Post -op Vital signs reviewed and stable  Post vital signs: Reviewed  Last Vitals:  Vitals Value Taken Time  BP 107/62 04/08/20 1324  Temp    Pulse 82 04/08/20 1326  Resp 21 04/08/20 1326  SpO2 98 % 04/08/20 1326  Vitals shown include unvalidated device data.  Last Pain:  Vitals:   04/08/20 0620  TempSrc: Tympanic  PainSc: 0-No pain         Complications: No apparent anesthesia complications

## 2020-04-08 NOTE — Interval H&P Note (Signed)
History and Physical Interval Note:  04/08/2020 7:24 AM  [name]  has presented today for surgery, with the diagnosis of Serous Adenocarcinoma.  The various methods of treatment have been discussed with the patient and family. After consideration of risks, benefits and other options for treatment, the patient has consented to  Procedure(s): XI ROBOTIC ASSISTED TOTAL HYSTERECTOMY BILATERAL SALPINGO OOPHORECTOMY WITH OMENTECTOMY AND DEBULKING.  Possible bowel surgery/ostomy, possible laparotomy (N/A) as a surgical intervention.  The patient's history has been reviewed, patient examined, no change in status, stable for surgery.  I have reviewed the patient's chart and labs.  Questions were answered to the patient's satisfaction.     [place]

## 2020-04-08 NOTE — Progress Notes (Signed)
   04/08/20 1530  Clinical Encounter Type  Visited With Patient  Visit Type Initial  Referral From Nurse  Consult/Referral To Chaplain  At the request of secretary, Chaplain stopped in to see patient. Patient was in pain, but said they had given her something.  Chaplain told patient of Chaplains availability, offered pray and left. Chaplain told Nurse Shirlean Mylar that patient was in pain and she said she would give her something. A follow up visit is needed.

## 2020-04-08 NOTE — Telephone Encounter (Signed)
C- please schedule follow on Tuesday on 4/20 at 8:45; MD- labs; cbc/bmp; [ Post-op/ follow up].

## 2020-04-08 NOTE — Op Note (Signed)
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 BMI 43.05 kg/m   SURGEON: Surgeon(s) and Role:    * Jontue Crumpacker, [place], MD - Primary    * Fredirick Maudlin, MD - consultant  ASSISTANT:  Benjaman Kindler, MD  ANESTHESIA: Choice   PROCEDURE: Procedure(s): Exam under anesthesia, robotic total hysterectomy, bilateral salpingo-oophorectomy, pelvic and paracolic peritonectomies, peritoneal stripping, extensive lysis of adhesions > 45 minutes; ablation of peritoneal/pelvic/mesenteric implants; conversion to hand-assisted port with infracolic omentectomy; cystoscopy  ESTIMATED BLOOD LOSS: ~50 cc  DRAINS: Foley  TOTAL IV FLUIDS: 800  UOP: 750  SPECIMENS:  Uterus, cervix, bilateral tubes/ovaries, pelvic and paracolic peritoneum, omentum  COMPLICATIONS: None  DISPOSITION: stable to recovery room  CONDITION: Stable  INDICATIONS:  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 The parametria was smooth. The cervix was negative for gross lesions on palpation. Intraoperative findings included 8-week size uterus that was completely adherent to the anterior abdominal wall and bladder. The adnexa were normal size. Tumor plaque was noted involving the anterior peritoneum, bilateral pelvic sidewall, bilateral paracolic gutters adjacent to the bowel, lateral left of the rectosigmoid, and cu;-de-sac. Tumor involvement was noted in the omentum. Along the small bowel surfaces and mesentery ranging from 1-10 mm. On visualization there were significant diaphragmatic plaques. The liver, stomach surface and bowel appeared normal.   Fagotti Score:  Massive peritoneal involvement and/or a miliary pattern of distribution forperitoneal carcinomatosis(score 2); wide spread infiltrating carcinomatosis,and/or confluent nodules to the most part of thediaphragmatic surface(score 0); large infiltrating nodules and/or an involvement of the root of themesenterysupposed on the basis of limited movements of the various intestinal segments (score 0); tumor diffusion along theomentumup to the large stomach curvature (score 0);possible large/smallbowelresection (excluding recto-sigmoid\resection) and/or extended carcinomatosis on the ansae (score 0);obvious neoplastic involvement of thegastricwall (score 0); and liversurface lesions larger than 2 cm (score 0). Total score = At most a 2 for miliary distribution of carcinomatosis. There was no massive involvement.   PROCEDURE IN DETAIL: After informed consent was obtained, the patient was taken to the operating room where anesthesia was obtained without difficulty. The patient was positioned in the dorsal lithotomy position in [place] and her arms were carefully tucked at her sides and the usual precautions were taken.  She was prepped and draped in normal sterile fashion.  Time-out was performed and a Foley catheter was placed into the bladder. A standard VCare uterine manipulator was not able to be placed due to body habitus and inability to visualize the cervix. An EEA sizer was used.    The abdomen was entered via a supraumbilical vertical incision that was carried down through the various layers until the peritoneal cavity was entered using a direct technique. Visualization was confirmed using the camera. Additional bilateral robotic ports and a LUQ AirSeal port was placed under direct visualization. The robot was docked. The patient was placed in Trendelenburg and the bowel was displaced up into the upper abdomen. Intraoperative findings were detailed as noted above.  Round ligaments were divided  on each side with the Monopolar endoshears and the retroperitoneal space was opened bilaterally.  The ureters were identified and preserved. The infundibulopelvic ligaments were skeletonized, sealed and divided. The adhesions involving the  peritoneum to the uterus and bladder were lysed. The bladder was backfilled to determine its location related to the adherent uterus.  A bladder flap was created and the bladder was dissected down off the lower uterine segment and cervix.  The uterine arteries were skeletonized bilaterally, sealed and divided.  The anterior vaginal wall was entered using the EEA sizer as a reference. A colpotomy was performed circumferentially and the cervix was incised from the vagina and the specimen was removed through the vagina. A pneumo balloon was placed in the vagina.  Pelvic and paracolic peritonectomies, as well as peritoneal stripping was performed. For the area involving the sigmoid colon, Dr. Celine Ahr was consulted and felt the dissection was off the bowel. There was no evidence of bowel injury. Peritoneal implants that were not resected were ablated. The pneumo balloon was removed and all specimens removed via the vagina. The vaginal cuff was then closed in a running continuous fashion with 0 V-Lock suture with careful attention to include the vaginal cuff angles and the vaginal mucosa within the closure. Hemostasis was observed. The intraperitoneal pressure was dropped, and all planes of dissection, vascular pedicles and the vaginal cuff were found to be hemostatic.    A 7 cm midline vertical incision was extending from the supra-umbilical trocar incision. The abdominal cavity entered and hand-assisted port placed. The abdomen and pelvis were palpated and additional identified nodules were either removed or ablated. This included ablation of ~20 lesions on the small bowel mesentary. The omentectomy was performed using the Ligasure. Hemostasis was adequate. The LUQ trocar and  lateral trocars were removed under visualization. The fascia there was closed with 0 PDS suture in a running technique. The skin incision at the umbilicus was closed with subcuticular stitch and reinforced with steri strips.  The remaining skin incisions were closed with 4-O Vircryl and glue. Exparel was used for the vertical incision.  The patient tolerated the procedure well.  Sponge, lap and needle counts were correct x2.  The patient was taken to recovery room in excellent condition.  Cystoscopy was performed demonstrating an intact bladder, no evidence of injury, and bilateral ureteral spill.   Antibiotics: Given 1st or 2nd generation cephalosporin, Antibiotics given within 1 hour of the start of the procedure, Antibiotics ordered to be discontinued within 24 hours post procedure   VTE prophylaxis: was ordered perioperatively. Heparin and SCDs.  Surgery required a high-level surgical assistant with none other readily available. Dr. Benjaman Kindler assisted me with the entire procedure.   Ozella Comins Gaetana Michaelis, MD

## 2020-04-09 ENCOUNTER — Other Ambulatory Visit: Payer: Self-pay | Admitting: Nurse Practitioner

## 2020-04-09 ENCOUNTER — Other Ambulatory Visit: Payer: Self-pay | Admitting: *Deleted

## 2020-04-09 DIAGNOSIS — Z1501 Genetic susceptibility to malignant neoplasm of breast: Secondary | ICD-10-CM | POA: Diagnosis not present

## 2020-04-09 DIAGNOSIS — J90 Pleural effusion, not elsewhere classified: Secondary | ICD-10-CM | POA: Diagnosis present

## 2020-04-09 DIAGNOSIS — Z30432 Encounter for removal of intrauterine contraceptive device: Secondary | ICD-10-CM | POA: Diagnosis not present

## 2020-04-09 DIAGNOSIS — M5126 Other intervertebral disc displacement, lumbar region: Secondary | ICD-10-CM | POA: Diagnosis present

## 2020-04-09 DIAGNOSIS — C482 Malignant neoplasm of peritoneum, unspecified: Secondary | ICD-10-CM

## 2020-04-09 DIAGNOSIS — C577 Malignant neoplasm of other specified female genital organs: Secondary | ICD-10-CM | POA: Diagnosis present

## 2020-04-09 DIAGNOSIS — Z9221 Personal history of antineoplastic chemotherapy: Secondary | ICD-10-CM | POA: Diagnosis not present

## 2020-04-09 DIAGNOSIS — I471 Supraventricular tachycardia: Secondary | ICD-10-CM | POA: Diagnosis present

## 2020-04-09 DIAGNOSIS — Z7901 Long term (current) use of anticoagulants: Secondary | ICD-10-CM | POA: Diagnosis not present

## 2020-04-09 DIAGNOSIS — F17210 Nicotine dependence, cigarettes, uncomplicated: Secondary | ICD-10-CM | POA: Diagnosis present

## 2020-04-09 DIAGNOSIS — C786 Secondary malignant neoplasm of retroperitoneum and peritoneum: Secondary | ICD-10-CM | POA: Diagnosis present

## 2020-04-09 DIAGNOSIS — C799 Secondary malignant neoplasm of unspecified site: Secondary | ICD-10-CM | POA: Diagnosis present

## 2020-04-09 DIAGNOSIS — N736 Female pelvic peritoneal adhesions (postinfective): Secondary | ICD-10-CM | POA: Diagnosis present

## 2020-04-09 DIAGNOSIS — Z6841 Body Mass Index (BMI) 40.0 and over, adult: Secondary | ICD-10-CM | POA: Diagnosis not present

## 2020-04-09 LAB — BASIC METABOLIC PANEL
Anion gap: 10 (ref 5–15)
BUN: 10 mg/dL (ref 6–20)
CO2: 24 mmol/L (ref 22–32)
Calcium: 8.7 mg/dL — ABNORMAL LOW (ref 8.9–10.3)
Chloride: 102 mmol/L (ref 98–111)
Creatinine, Ser: 0.72 mg/dL (ref 0.44–1.00)
GFR calc Af Amer: >60 mL/min (ref >60)
GFR calc non Af Amer: >60 mL/min (ref >60)
Glucose, Bld: 133 mg/dL — ABNORMAL HIGH (ref 70–99)
Potassium: 4.3 mmol/L (ref 3.5–5.1)
Sodium: 136 mmol/L (ref 135–145)

## 2020-04-09 LAB — CBC
HCT: 34.6 % — ABNORMAL LOW (ref 36.0–46.0)
Hemoglobin: 11.8 g/dL — ABNORMAL LOW (ref 12.0–15.0)
MCH: 35.9 pg — ABNORMAL HIGH (ref 26.0–34.0)
MCHC: 34.1 g/dL (ref 30.0–36.0)
MCV: 105.2 fL — ABNORMAL HIGH (ref 80.0–100.0)
Platelets: 232 10*3/uL (ref 150–400)
RBC: 3.29 MIL/uL — ABNORMAL LOW (ref 3.87–5.11)
RDW: 16.9 % — ABNORMAL HIGH (ref 11.5–15.5)
WBC: 11 10*3/uL — ABNORMAL HIGH (ref 4.0–10.5)
nRBC: 0 % (ref 0.0–0.2)

## 2020-04-09 MED ORDER — ENOXAPARIN SODIUM 60 MG/0.6ML ~~LOC~~ SOLN: 60.0000 mg | Freq: Two times a day (BID) | SUBCUTANEOUS | 0 refills | Status: DC

## 2020-04-09 MED ORDER — HYDROMORPHONE HCL 1 MG/ML IJ SOLN: 1.0000 mg | INTRAMUSCULAR | Status: DC | PRN

## 2020-04-09 MED ORDER — OXYCODONE HCL 5 MG PO TABS
5.0000 mg | ORAL_TABLET | ORAL | Status: DC | PRN
  Administered 2020-04-09 – 2020-04-11 (×11): 10 mg via ORAL
  Filled 2020-04-09 (×11): qty 2

## 2020-04-09 MED ORDER — ACETAMINOPHEN 500 MG PO TABS
1000.0000 mg | ORAL_TABLET | Freq: Four times a day (QID) | ORAL | Status: DC
  Administered 2020-04-09 – 2020-04-11 (×9): 1000 mg via ORAL
  Filled 2020-04-09 (×9): qty 2

## 2020-04-09 MED ORDER — RIVAROXABAN 20 MG PO TABS: 20.0000 mg | ORAL_TABLET | Freq: Every day | ORAL | 1 refills | Status: DC

## 2020-04-09 MED ORDER — GABAPENTIN 300 MG PO CAPS
900.0000 mg | ORAL_CAPSULE | Freq: Every day | ORAL | Status: DC
  Administered 2020-04-09 – 2020-04-10 (×2): 900 mg via ORAL
  Filled 2020-04-09 (×2): qty 3

## 2020-04-09 NOTE — Progress Notes (Signed)
Orders entered for lovenox 60 mg BID prophylactic dosing to start on POD 1 and 2. On POD 3, start back on xarelto 20 mg daily with supper for therapeutic dosing.

## 2020-04-09 NOTE — Progress Notes (Signed)
PICC in left upper arm out to the 8cm mark. Tip in brachiocephalic vein. PICC d/c'd per MD order.

## 2020-04-09 NOTE — Progress Notes (Signed)
1 Day Post-Op       Procedure(s): XI ROBOTIC ASSISTED TOTAL HYSTERECTOMY BILATERAL SALPINGO OOPHORECTOMY WITH OMENTECTOMY AND DEBULKING.   (N/A) INTRAUTERINE DEVICE (IUD) REMOVAL (N/A) CYSTOSCOPY (N/A) Subjective: The patient is doing well.  No nausea or vomiting. Pain is adequately controlled with po meds - PICC line occluded as an outpatient, and PICC team removed this morning - has not yet been out of bed  Objective: Vital signs in last 24 hours: Temp:  [97 F (36.1 C)-98.3 F (36.8 C)] 98 F (36.7 C) (04/15 1128) Pulse Rate:  [69-97] 97 (04/15 1128) Resp:  [11-25] 18 (04/15 1128) BP: (100-128)/(61-83) 108/61 (04/15 1128) SpO2:  [90 %-100 %] 96 % (04/15 1128)  Intake/Output  Intake/Output Summary (Last 24 hours) at 04/09/2020 1256 Last data filed at 04/09/2020 1030 Gross per 24 hour  Intake 3453.4 ml  Output 2300 ml  Net 1153.4 ml    Physical Exam:  General: Alert and oriented. CV: RRR Lungs: Clear bilaterally. GI: Soft, Nondistended. Incisions: Clean and dry. Urine: Clear, Foley in place Extremities: Nontender, no erythema, no edema.  Lab Results: Recent Labs    04/08/20 1643 04/09/20 0004  HGB 12.6 11.8*  HCT 37.5 34.6*  WBC 13.0* 11.0*  PLT 235 232                 Results for orders placed or performed during the hospital encounter of 04/08/20 (from the past 24 hour(s))  CBC     Status: Abnormal   Collection Time: 04/08/20  4:43 PM  Result Value Ref Range   WBC 13.0 (H) 4.0 - 10.5 K/uL   RBC 3.52 (L) 3.87 - 5.11 MIL/uL   Hemoglobin 12.6 12.0 - 15.0 g/dL   HCT 37.5 36.0 - 46.0 %   MCV 106.5 (H) 80.0 - 100.0 fL   MCH 35.8 (H) 26.0 - 34.0 pg   MCHC 33.6 30.0 - 36.0 g/dL   RDW 16.9 (H) 11.5 - 15.5 %   Platelets 235 150 - 400 K/uL   nRBC 0.0 0.0 - 0.2 %  Creatinine, serum     Status: None   Collection Time: 04/08/20  4:43 PM  Result Value Ref Range   Creatinine, Ser 0.72 0.44 - 1.00 mg/dL   GFR calc non Af Amer >60 >60 mL/min   GFR calc Af Amer  >60 >60 mL/min  CBC     Status: Abnormal   Collection Time: 04/09/20 12:04 AM  Result Value Ref Range   WBC 11.0 (H) 4.0 - 10.5 K/uL   RBC 3.29 (L) 3.87 - 5.11 MIL/uL   Hemoglobin 11.8 (L) 12.0 - 15.0 g/dL   HCT 34.6 (L) 36.0 - 46.0 %   MCV 105.2 (H) 80.0 - 100.0 fL   MCH 35.9 (H) 26.0 - 34.0 pg   MCHC 34.1 30.0 - 36.0 g/dL   RDW 16.9 (H) 11.5 - 15.5 %   Platelets 232 150 - 400 K/uL   nRBC 0.0 0.0 - 0.2 %  Basic metabolic panel     Status: Abnormal   Collection Time: 04/09/20 12:04 AM  Result Value Ref Range   Sodium 136 135 - 145 mmol/L   Potassium 4.3 3.5 - 5.1 mmol/L   Chloride 102 98 - 111 mmol/L   CO2 24 22 - 32 mmol/L   Glucose, Bld 133 (H) 70 - 99 mg/dL   BUN 10 6 - 20 mg/dL   Creatinine, Ser 0.72 0.44 - 1.00 mg/dL   Calcium 8.7 (L) 8.9 -  10.3 mg/dL   GFR calc non Af Amer >60 >60 mL/min   GFR calc Af Amer >60 >60 mL/min   Anion gap 10 5 - 15    Assessment/Plan: 1 Day Post-Op       Procedure(s): XI ROBOTIC ASSISTED TOTAL HYSTERECTOMY BILATERAL SALPINGO OOPHORECTOMY WITH OMENTECTOMY AND DEBULKING.   (N/A) INTRAUTERINE DEVICE (IUD) REMOVAL (N/A) CYSTOSCOPY (N/A)   Anticoagulation: Will start today lovenox 60 mg BID prophylactic dosing to start on POD 1 and 2. On POD 3, start back on xarelto 20 mg daily with supper for therapeutic dosing. Managed by gyn onc outpatient office.  -Ambulate, Incentive spirometry -Advance diet as tolerated -Transition to oral pain medication -Discharge home tomorrow anticipated    Benjaman Kindler, MD   LOS: 1 day   Benjaman Kindler 04/09/2020, 12:56 PM

## 2020-04-09 NOTE — Progress Notes (Signed)
   04/09/20 1000  Clinical Encounter Type  Visited With Patient  Visit Type Follow-up  Referral From Chaplain  Consult/Referral To Chaplain  Chaplain visited with patient briefly to see how she is doing. Patient appears to be in pain but said she was fine. She is doing better than she was yesterday. Patient smiled during the conversation and seems to be upbeat. Chaplain made patient aware of chaplains availability and left.

## 2020-04-09 NOTE — Progress Notes (Signed)
PICC line occluded. Spoke with Dr. Rogue Bussing, [place] to remove by IV team.

## 2020-04-09 NOTE — Anesthesia Postprocedure Evaluation (Signed)
Anesthesia Post Note  Patient: [name]  Procedure(s) Performed: XI ROBOTIC ASSISTED TOTAL HYSTERECTOMY BILATERAL SALPINGO OOPHORECTOMY WITH OMENTECTOMY AND DEBULKING.   (N/A Abdomen) INTRAUTERINE DEVICE (IUD) REMOVAL (N/A Uterus) CYSTOSCOPY (N/A Bladder)  Patient location during evaluation: PACU Anesthesia Type: General Level of consciousness: awake and alert Pain management: pain level controlled Vital Signs Assessment: post-procedure vital signs reviewed and stable Respiratory status: spontaneous breathing, nonlabored ventilation, respiratory function stable and patient connected to nasal cannula oxygen Cardiovascular status: blood pressure returned to baseline and stable Postop Assessment: no apparent nausea or vomiting Anesthetic complications: no     Last Vitals:  Vitals:   04/09/20 1556 04/09/20 2018  BP: (!) 97/54 (!) 100/55  Pulse: 78 78  Resp: 20 20  Temp: 37 C 36.9 C  SpO2: 97% 96%    Last Pain:  Vitals:   04/09/20 1822  TempSrc:   PainSc: 6                  Martha Clan

## 2020-04-09 NOTE — Progress Notes (Signed)
Patient is doing well today. Transitioned well from PCA to PO pain medication. Tolerating regular diet, belching but has not passed gas. Up to chair this afternoon.

## 2020-04-10 MED ORDER — METOPROLOL TARTRATE 25 MG PO TABS
12.5000 mg | ORAL_TABLET | Freq: Every day | ORAL | Status: DC
  Administered 2020-04-11: 12.5 mg via ORAL
  Filled 2020-04-10: qty 0.5

## 2020-04-10 NOTE — Progress Notes (Signed)
2 Days Post-Op       Procedure(s): XI ROBOTIC ASSISTED TOTAL HYSTERECTOMY BILATERAL SALPINGO OOPHORECTOMY WITH OMENTECTOMY AND DEBULKING.   (N/A) INTRAUTERINE DEVICE (IUD) REMOVAL (N/A) CYSTOSCOPY (N/A) Subjective: The patient is doing well.  No nausea or vomiting. Pain is adequately controlled. Tolerating regular diet but still needing po pain meds 10mg  oxycodone q4hrs. Has been OOB to chair yesterday and today, and moving to the bathroom and back but hasn't walked out of the room yet.  Objective: Vital signs in last 24 hours: Temp:  [97.7 F (36.5 C)-98.6 F (37 C)] 97.7 F (36.5 C) (04/16 0826) Pulse Rate:  [76-97] 76 (04/16 0826) Resp:  [18-20] 18 (04/16 0826) BP: (97-113)/(53-79) 113/79 (04/16 0826) SpO2:  [96 %-97 %] 96 % (04/16 0826)  Intake/Output  Intake/Output Summary (Last 24 hours) at 04/10/2020 1102 Last data filed at 04/10/2020 0415 Gross per 24 hour  Intake --  Output 2700 ml  Net -2700 ml    Physical Exam:  General: Alert and oriented. CV: RRR Lungs: Clear bilaterally. GI: Soft, Nondistended. Incisions: Clean and dry. Urine: Clear, Foley in place Extremities: Nontender, no erythema, no edema.  Lab Results: Recent Labs    04/08/20 1643 04/09/20 0004  HGB 12.6 11.8*  HCT 37.5 34.6*  WBC 13.0* 11.0*  PLT 235 232                No results found for this or any previous visit (from the past 24 hour(s)).  Assessment/Plan: 2 Days Post-Op       Procedure(s): XI ROBOTIC ASSISTED TOTAL HYSTERECTOMY BILATERAL SALPINGO OOPHORECTOMY WITH OMENTECTOMY AND DEBULKING.   (N/A) INTRAUTERINE DEVICE (IUD) REMOVAL (N/A) CYSTOSCOPY (N/A)  Anticoagulation: Pt on lovenox 60 mg BID prophylactic dosing. Tomorrow, on POD 3, start back on xarelto 20 mg daily with supper for therapeutic dosing.Managed by gyn onc outpatient office.  -encourage ambulation today - continue IS - continue to advance diet - Foley removal scheduled for POD6 at Gyn onc -Discharge home  tomorrow after ambulation  anticipated   Benjaman Kindler, MD   LOS: 2 days   Benjaman Kindler 04/10/2020, 11:02 AM

## 2020-04-11 MED ORDER — ACETAMINOPHEN 500 MG PO TABS: 1000.0000 mg | ORAL_TABLET | Freq: Four times a day (QID) | ORAL | 0 refills | Status: AC

## 2020-04-11 MED ORDER — OXYCODONE HCL 5 MG PO TABS: 5.0000 mg | ORAL_TABLET | Freq: Three times a day (TID) | ORAL | 0 refills | Status: AC | PRN

## 2020-04-11 NOTE — Progress Notes (Signed)
Patient discharged to home. Medications/Prescriptions and discharge instructions reviewed with patient who verbalized understanding. Pt discharged with family via W/C. Will schedule follow-up as instructed.

## 2020-04-11 NOTE — Discharge Summary (Signed)
3 Days Post-Op       Procedure(s): XI ROBOTIC ASSISTED TOTAL HYSTERECTOMY BILATERAL SALPINGO OOPHORECTOMY WITH OMENTECTOMY AND DEBULKING.   (N/A) INTRAUTERINE DEVICE (IUD) REMOVAL (N/A) CYSTOSCOPY (N/A) Subjective: The patient is doing well. Vomiting overnight relieved an acute bout of nausea, and she did move her bowels simultaneously. She feels very well today.   Pain is adequately controlled. Tolerating regular diet, needing po pain meds 10mg  oxycodone q6-8hrs.   Ambulating without assistance, foley in place. Foley teaching today  Objective: Vital signs in last 24 hours: Temp:  [97.6 F (36.4 C)-98.6 F (37 C)] 98.2 F (36.8 C) (04/17 0834) Pulse Rate:  [80-89] 86 (04/17 0834) Resp:  [18-20] 18 (04/17 0834) BP: (103-129)/(63-86) 125/75 (04/17 0834) SpO2:  [94 %-97 %] 96 % (04/17 0834)  Intake/Output  Intake/Output Summary (Last 24 hours) at 04/11/2020 1024 Last data filed at 04/11/2020 0600 Gross per 24 hour  Intake 960 ml  Output 1950 ml  Net -990 ml    Physical Exam:  General: Alert and oriented. CV: RRR Lungs: Clear bilaterally. GI: Soft, Nondistended. Incisions: Clean and dry. Urine: Clear, Foley in place Extremities: Nontender, no erythema, no edema.  Lab Results: Recent Labs    04/08/20 1643 04/09/20 0004  HGB 12.6 11.8*  HCT 37.5 34.6*  WBC 13.0* 11.0*  PLT 235 232   Assessment/Plan: 3 Days Post-Op       Procedure(s): XI ROBOTIC ASSISTED TOTAL HYSTERECTOMY BILATERAL SALPINGO OOPHORECTOMY WITH OMENTECTOMY AND DEBULKING.   (N/A) INTRAUTERINE DEVICE (IUD) REMOVAL (N/A) CYSTOSCOPY (N/A)  Anticoagulation: Pt on lovenox 60 mg BID prophylactic dosing. Tonight, on POD 3, start back on xarelto 20 mg daily with supper for therapeutic dosing.Managed by gyn onc outpatient office.  - Foley removal scheduled for POD6 at [facility]yn onc -Discharge home today -call with questions   Benjaman Kindler, MD   LOS: 3 days   Benjaman Kindler 04/11/2020, 10:24 AM

## 2020-04-11 NOTE — Discharge Instructions (Signed)
Postop constipation is a major cause of pain. Stay well hydrated, walk as you tolerate, and take over the counter senna as well as stool softeners if you need them.   Signs and Symptoms to Report Call our office at [phone] if you have any of the following.  . Fever over 100.4 degrees or higher . Severe stomach pain not relieved with pain medications . Bright red bleeding that's heavier than a period that does not slow with rest . To go the bathroom a lot (frequency), you can't hold your urine (urgency), or it hurts when you empty your bladder (urinate) . Chest pain . Shortness of breath . Pain in the calves of your legs . Severe nausea and vomiting not relieved with anti-nausea medications . Signs of infection around your wounds, such as redness, hot to touch, swelling, green/yellow drainage (like pus), bad smelling discharge . Any concerns  What You Can Expect after Surgery . You may see some pink tinged, bloody fluid and bruising around the wound. This is normal. . You may have a sore throat because of the tube in your mouth during general anesthesia. This will go away in 2 to 3 days. . You may have some stomach cramps. . You may notice spotting on your panties. . You may have pain around the incision sites.   Activities after Your Discharge Follow these guidelines to help speed your recovery at home: . Do the coughing and deep breathing as you did in the hospital for 2 weeks. Use the small blue breathing device, called the incentive spirometer for 2 weeks. . Don't drive if you are in pain or taking narcotic pain medicine. You may drive when you can safely slam on the brakes, turn the wheel forcefully, and rotate your torso comfortably. This is typically 1-2 weeks. Practice in a parking lot or side street prior to attempting to drive regularly.  . Ask others to help with household chores for 4 weeks. . Do not lift anything heavier that 10 pounds for 4-6 weeks. This includes  pets, children, and groceries. . Don't do strenuous activities, exercises, or sports like vacuuming, tennis, squash, etc. until your doctor says it is safe to do so. ---If you had a hysterectomy (abdominal, laparoscopic, or vaginal) do not have intercourse for 8-10 weeks.  . Walk as you feel able. Rest often since it may take two or three weeks for your energy level to return to normal.  . You may climb stairs . Avoid constipation:   -Eat fruits, vegetables, and whole grains. Eat small meals as your appetite will take time to return to normal.   -Drink 6 to 8 glasses of water each day unless your doctor has told you to limit your fluids.   -Use a laxative or stool softener as needed if constipation becomes a problem. You may take Miralax, metamucil, Citrucil, Colace, Senekot, FiberCon, etc. If this does not relieve the constipation, try two tablespoons of Milk Of Magnesia every 8 hours until your bowels move.  . You may shower. Gently wash the wounds with a mild soap and water. Pat dry. . Do not get in a hot tub, swimming pool, etc. until your doctor agrees. . Do not use lotions, oils, powders on the wounds. . Do not douche, use tampons, or have sex until your doctor says it is okay. . Take your pain medicine when you need it. The medicine may not work as well if the pain is bad.  Take the medicines  you were taking before surgery. Other medications you will need are pain medications (Norco or Percocet) and nausea medications (Zofran).  Here is a helpful article from the website [employer], regarding constipation  Here are reasons why constipation occurs after surgery: 1) During the operation and in the recovery room, most people are given opioid pain medication, primarily through an IV, to treat moderate or severe pain. Intravenous opioids include morphine, Dilaudid and fentanyl. After surgery, patients are often prescribed opioid pain medication to take by mouth at home, including codeine,  Vicodin, Norco, and Percocet. All of these medications cause constipation by slowing down the movement of your intestine. 2) Changes in your diet before surgery can be another culprit. It is common to get specific instructions to change how you normally eat or drink before your surgery, like only having liquids the day before or not having anything to eat or drink after midnight the night before surgery. For this reason, temporary dehydration may occur. This, along with not eating or only having liquids, means that you are getting less fiber than usual. Both these factors contribute to constipation. 3) Changes in your diet after surgery can also contribute to the problem. Although many people don't have dietary restrictions after operations, being under anesthesia can make you lose your appetite for several hours and maybe even days. Some people can even have nausea or vomiting. Not eating or drinking normally means that you are not getting enough fiber and you can get dehydrated, both leading to constipation. 4) Lying in a bed more than usual--which happens before, during and after surgery--combined with the medications and diet changes, all work together to slow down your colon and make your poop turn to rock.  No one likes to be constipated.  Let's face it, it's not a pleasant feeling when you don't poop for days, then strain on the toilet to finally pass something large enough to cause damage. An ounce of prevention is worth a pound of cure, so: 1. Assume you will be constipated. 2. Plan and prepare accordingly. Post-surgery is one of those unique situations where the temporary use of laxatives can make a world of difference. Always consult with your doctor, and recognize that if you wait several days after surgery to take a laxative, the constipation might be too severe for these over-the-counter options. It is always important to discuss all medications you plan on taking with your doctor. Ask your  doctor if you can start the laxative immediately after surgery. *  Here are go-to post-surgery laxatives: Senna: Senna is an herb that acts as a "stimulant laxative," meaning it increases the activity of the intestine to cause you to have a bowel movement. It comes in many forms, but senna pills are easy to take and are sold over the counter at almost all pharmacies. Since opioid pain medications slow down the activity of the intestine, it makes sense to take a medication to help reverse that side effect. Long-term use of a stimulant laxative is not a good idea since it can make your colon "lazy" and not function properly; however, temporary use immediately after surgery is acceptable. In general, if you are able to eat a normal diet, taking senna soon after surgery works the best. Senna usually works within hours to produce a bowel movement, but this is less predictable when you are taking different medications after surgery. Try not to wait several days to start taking senna, as often it is too late by then. Just like  with all medications or supplements, check with your doctor before starting new treatment.   Magnesium: Magnesium is an important mineral that our body needs. We get magnesium from some foods that we eat, especially foods that are high in fiber such as broccoli, almonds and whole grains. There are also magnesium-based medications used to treat constipation including milk of magnesia (magnesium hydroxide), magnesium citrate and magnesium oxide. They work by drawing water into the intestine, putting it into the class of "osmotic" laxatives. Magnesium products in low doses appear to be safe, but if taken in very large doses, can lead to problems such as irregular heartbeat, low blood pressure and even death. It can also affect other medications you might be taking, therefore it is important to discuss using magnesium with your physician and pharmacist before initiating therapy. Most  over-the-counter magnesium laxatives work very well to help with the constipation related to surgery, but sometimes they work too well and lead to diarrhea. Make sure you are somewhere with easy access to a bathroom, just in case.   Bisacodyl: Bisacodyl (generic name) is sold under brand names such as Dulcolax. Much like senna, it is a "stimulant laxative," meaning it makes your intestines move more quickly to push out the stool. This is another good choice to start taking as soon as your doctor says you can take a laxative after surgery. It comes in pill form and as a suppository, which is a good choice for people who cannot or are not allowed to swallow pills. Studies have shown that it works as a laxative, but like most of these medications, you should use this on a short-term basis only.   Enema: Enemas strike fear in many people, but FEAR NOT! It's nowhere near as big a deal as you may think. An enema is just a way to get some liquid into your rectum by placing a specially designed device through your anus. If you have never done one, it might seem like a painful, unpleasant, uncomfortable, complicated and lengthy procedure. But in reality, it's simple, takes just a few seconds and is highly effective. The small ready-made bottles you buy at the pharmacy are much easier than the hose/large rubber container type. Those recommended positions illustrated in some instructions are generally not necessary to place the enema. It's very similar to the insertion of a tampon, requiring a slight squat. Some extra lubrication on the enema's tip (or on your anus) will make it a breeze. In certain cases, there is no substitute for a good enema. For example, if someone has not pooped for a few days, the beginning of the poop waiting to come out can become rock hard. Passing that hard stool can lead to much pain and problems like anal fissures. Inserting a little liquid to break up the rock-hard stool will help make its  passage much easier. Enemas come with different liquids. Most come with saline, but there are also mineral oil options. You can also use warm water in the reusable enema containers. They all work. But since saline can sometimes be irritating, so try a mineral oil or water enema instead.  Here are commonly recommended constipation medications that do not work well for post-surgery constipation: Docusate: Docusate (generic name) most commonly referred to as Colace (brand name) is not really a laxative, but is classified as a stool softener. Although this medication is commonly prescribed, it is not recommended for several reasons: 1) there is no good medical evidence that it works 2)  even if it has an effect, which is very questionable, it is minimal and cannot combat the intestinal slowing caused by the opioid medications. Skip docusate to save money and space in your pillbox for something more effective.  PEG: Miralax (brand name) is basically a chemical called polyethylene glycol (PEG) and it has gained tremendous popularity as a laxative. This product is an "osmotic laxative" meaning it works by pulling water into the stool, making it softer. This is very similar to the action of natural fiber in foods and supplements. Therefore, the effect seen by this medication is not immediate, causing a bowel movement in a day or more. Is this medication strong enough to battle the constipation related to having an operation? Maybe for some people not prone to constipation. But for most people, other laxatives are better to prevent constipation after surgery.

## 2020-04-14 ENCOUNTER — Telehealth (INDEPENDENT_AMBULATORY_CARE_PROVIDER_SITE_OTHER): Payer: Self-pay

## 2020-04-14 ENCOUNTER — Other Ambulatory Visit: Payer: Self-pay

## 2020-04-14 ENCOUNTER — Inpatient Hospital Stay (HOSPITAL_BASED_OUTPATIENT_CLINIC_OR_DEPARTMENT_OTHER): Payer: Medicaid Other | Admitting: Internal Medicine

## 2020-04-14 ENCOUNTER — Inpatient Hospital Stay: Payer: Medicaid Other

## 2020-04-14 DIAGNOSIS — Z7901 Long term (current) use of anticoagulants: Secondary | ICD-10-CM | POA: Diagnosis not present

## 2020-04-14 DIAGNOSIS — C3411 Malignant neoplasm of upper lobe, right bronchus or lung: Secondary | ICD-10-CM | POA: Diagnosis not present

## 2020-04-14 DIAGNOSIS — K068 Other specified disorders of gingiva and edentulous alveolar ridge: Secondary | ICD-10-CM | POA: Diagnosis not present

## 2020-04-14 DIAGNOSIS — N951 Menopausal and female climacteric states: Secondary | ICD-10-CM | POA: Diagnosis not present

## 2020-04-14 DIAGNOSIS — F17210 Nicotine dependence, cigarettes, uncomplicated: Secondary | ICD-10-CM | POA: Diagnosis not present

## 2020-04-14 DIAGNOSIS — I119 Hypertensive heart disease without heart failure: Secondary | ICD-10-CM | POA: Diagnosis not present

## 2020-04-14 DIAGNOSIS — Z452 Encounter for adjustment and management of vascular access device: Secondary | ICD-10-CM

## 2020-04-14 DIAGNOSIS — K5900 Constipation, unspecified: Secondary | ICD-10-CM | POA: Diagnosis not present

## 2020-04-14 DIAGNOSIS — Z9221 Personal history of antineoplastic chemotherapy: Secondary | ICD-10-CM | POA: Diagnosis not present

## 2020-04-14 DIAGNOSIS — R202 Paresthesia of skin: Secondary | ICD-10-CM | POA: Diagnosis not present

## 2020-04-14 DIAGNOSIS — C482 Malignant neoplasm of peritoneum, unspecified: Secondary | ICD-10-CM

## 2020-04-14 DIAGNOSIS — Z90722 Acquired absence of ovaries, bilateral: Secondary | ICD-10-CM | POA: Diagnosis not present

## 2020-04-14 DIAGNOSIS — R180 Malignant ascites: Secondary | ICD-10-CM | POA: Diagnosis not present

## 2020-04-14 DIAGNOSIS — Z79899 Other long term (current) drug therapy: Secondary | ICD-10-CM | POA: Diagnosis not present

## 2020-04-14 DIAGNOSIS — Z9079 Acquired absence of other genital organ(s): Secondary | ICD-10-CM | POA: Diagnosis not present

## 2020-04-14 DIAGNOSIS — Z90710 Acquired absence of both cervix and uterus: Secondary | ICD-10-CM | POA: Diagnosis not present

## 2020-04-14 DIAGNOSIS — R200 Anesthesia of skin: Secondary | ICD-10-CM | POA: Diagnosis not present

## 2020-04-14 DIAGNOSIS — I82621 Acute embolism and thrombosis of deep veins of right upper extremity: Secondary | ICD-10-CM | POA: Diagnosis not present

## 2020-04-14 LAB — CBC WITH DIFFERENTIAL/PLATELET
Abs Immature Granulocytes: 0.04 10*3/uL (ref 0.00–0.07)
Basophils Absolute: 0 10*3/uL (ref 0.0–0.1)
Basophils Relative: 1 %
Eosinophils Absolute: 0.1 10*3/uL (ref 0.0–0.5)
Eosinophils Relative: 2 %
HCT: 36.1 % (ref 36.0–46.0)
Hemoglobin: 11.8 g/dL — ABNORMAL LOW (ref 12.0–15.0)
Immature Granulocytes: 1 %
Lymphocytes Relative: 25 %
Lymphs Abs: 1.2 10*3/uL (ref 0.7–4.0)
MCH: 35 pg — ABNORMAL HIGH (ref 26.0–34.0)
MCHC: 32.7 g/dL (ref 30.0–36.0)
MCV: 107.1 fL — ABNORMAL HIGH (ref 80.0–100.0)
Monocytes Absolute: 0.5 10*3/uL (ref 0.1–1.0)
Monocytes Relative: 10 %
Neutro Abs: 3.1 10*3/uL (ref 1.7–7.7)
Neutrophils Relative %: 61 %
Platelets: 214 10*3/uL (ref 150–400)
RBC: 3.37 MIL/uL — ABNORMAL LOW (ref 3.87–5.11)
RDW: 15.4 % (ref 11.5–15.5)
WBC: 5 10*3/uL (ref 4.0–10.5)
nRBC: 0 % (ref 0.0–0.2)

## 2020-04-14 LAB — BASIC METABOLIC PANEL
Anion gap: 11 (ref 5–15)
BUN: 9 mg/dL (ref 6–20)
CO2: 25 mmol/L (ref 22–32)
Calcium: 9 mg/dL (ref 8.9–10.3)
Chloride: 101 mmol/L (ref 98–111)
Creatinine, Ser: 0.66 mg/dL (ref 0.44–1.00)
GFR calc Af Amer: >60 mL/min (ref >60)
GFR calc non Af Amer: >60 mL/min (ref >60)
Glucose, Bld: 117 mg/dL — ABNORMAL HIGH (ref 70–99)
Potassium: 3.8 mmol/L (ref 3.5–5.1)
Sodium: 137 mmol/L (ref 135–145)

## 2020-04-14 NOTE — Assessment & Plan Note (Addendum)
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 Currently s/p neoadjuvant chemotherapy #4 carbotaxol; added bev with cycle #2.  Clinically-improving; February 2021- 120s [down from 8000 baseline].  # S/p debulking surgery on 4/14-recovering without any significant postoperative complications.  Proceed with adjuvant therapy carbotaxol in approximately 3 weeks.  # Foley cath removal-discussed with Roderic Ovens.  # right upper extremity DVT [ [jan 15th 2021]; currently on Xarelto 20 mg/day post surgery x1 month.  # IV acess- plan port 2 days prior to port placement.    # DISPOSITION:  # refer to IR for port placement in 2 weeks.  #Follow-up in 3 weeks-MD labs-CBC CMP CA 125; carbotaxol- Dr.B

## 2020-04-14 NOTE — Patient Instructions (Signed)

## 2020-04-14 NOTE — Progress Notes (Signed)
Patient presents to clinic with an indwelling foley cath. Patient stated that the "cancer center was to remove this catheter today." patient reports that she has not had a BM in 3-4 days. Using colace for constipation with no effect. She reports minimal abdominal pain. She denies nausea/vomiting/no fevers. Per patient, surgical wound is healing- no signs of infection.Marland Kitchen

## 2020-04-14 NOTE — Telephone Encounter (Signed)
Spoke with the patient and she is now scheduled with Dr. Lucky Cowboy for a port placement on 04/23/20 with a 6:45 am arrival time to the [facility]. Patient will do covid testing on 04/21/20 between 8-1 pm at the [facility]. Pre-procedure instructions were discussed and will be mailed to the patient.
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
## 2020-04-14 NOTE — Progress Notes (Signed)
Foley catheter removed. [name] drank 24 oz of water and was not able to void prior to leaving cancer center. She was not able to stay due to current transportation. She was instructed to return to the cancer center by 1500 to have catheter replaced if she has not voided adequate amounts, or sooner if she has not voided and becomes uncomfortable. She verbalized understanding.

## 2020-04-15 ENCOUNTER — Telehealth: Payer: Self-pay

## 2020-04-15 LAB — CA 125: Cancer Antigen (CA) 125: 69.7 U/mL — ABNORMAL HIGH (ref 0.0–38.1)

## 2020-04-15 NOTE — Telephone Encounter (Signed)
Received call x2 from [name] 4/20 with update on urination. She started voiding spontaneously moderate amounts of yellow urine without difficulty. Denies hesitation or straining.

## 2020-04-20 ENCOUNTER — Other Ambulatory Visit: Payer: Medicaid Other

## 2020-04-20 ENCOUNTER — Encounter: Payer: Self-pay | Admitting: Internal Medicine

## 2020-04-20 ENCOUNTER — Inpatient Hospital Stay: Payer: Medicaid Other

## 2020-04-20 ENCOUNTER — Inpatient Hospital Stay: Payer: Medicaid Other | Admitting: Internal Medicine

## 2020-04-21 ENCOUNTER — Other Ambulatory Visit: Payer: Self-pay

## 2020-04-21 ENCOUNTER — Other Ambulatory Visit
Admission: RE | Admit: 2020-04-21 | Discharge: 2020-04-21 | Disposition: A | Discharge status: Home / Self Care | Payer: Medicaid Other | Source: Ambulatory Visit | Attending: Vascular Surgery | Admitting: Vascular Surgery

## 2020-04-21 DIAGNOSIS — Z20822 Contact with and (suspected) exposure to covid-19: Secondary | ICD-10-CM | POA: Insufficient documentation

## 2020-04-21 DIAGNOSIS — Z01812 Encounter for preprocedural laboratory examination: Secondary | ICD-10-CM | POA: Insufficient documentation

## 2020-04-21 LAB — SARS CORONAVIRUS 2 (TAT 6-24 HRS): SARS Coronavirus 2: NEGATIVE

## 2020-04-22 ENCOUNTER — Inpatient Hospital Stay (HOSPITAL_BASED_OUTPATIENT_CLINIC_OR_DEPARTMENT_OTHER): Payer: Medicaid Other | Admitting: Obstetrics and Gynecology

## 2020-04-22 ENCOUNTER — Other Ambulatory Visit (INDEPENDENT_AMBULATORY_CARE_PROVIDER_SITE_OTHER): Payer: Self-pay | Admitting: Nurse Practitioner

## 2020-04-22 VITALS — BP 119/91 | HR 90 | Temp 96.7°F | Resp 16 | Wt 243.0 lb

## 2020-04-22 DIAGNOSIS — K5900 Constipation, unspecified: Secondary | ICD-10-CM | POA: Diagnosis not present

## 2020-04-22 DIAGNOSIS — N951 Menopausal and female climacteric states: Secondary | ICD-10-CM

## 2020-04-22 DIAGNOSIS — I82621 Acute embolism and thrombosis of deep veins of right upper extremity: Secondary | ICD-10-CM | POA: Diagnosis not present

## 2020-04-22 DIAGNOSIS — I119 Hypertensive heart disease without heart failure: Secondary | ICD-10-CM | POA: Diagnosis not present

## 2020-04-22 DIAGNOSIS — C3411 Malignant neoplasm of upper lobe, right bronchus or lung: Secondary | ICD-10-CM | POA: Diagnosis not present

## 2020-04-22 DIAGNOSIS — R200 Anesthesia of skin: Secondary | ICD-10-CM | POA: Diagnosis not present

## 2020-04-22 DIAGNOSIS — R180 Malignant ascites: Secondary | ICD-10-CM | POA: Diagnosis not present

## 2020-04-22 DIAGNOSIS — Z79899 Other long term (current) drug therapy: Secondary | ICD-10-CM | POA: Diagnosis not present

## 2020-04-22 DIAGNOSIS — Z90710 Acquired absence of both cervix and uterus: Secondary | ICD-10-CM | POA: Diagnosis not present

## 2020-04-22 DIAGNOSIS — K068 Other specified disorders of gingiva and edentulous alveolar ridge: Secondary | ICD-10-CM | POA: Diagnosis not present

## 2020-04-22 DIAGNOSIS — C482 Malignant neoplasm of peritoneum, unspecified: Secondary | ICD-10-CM | POA: Diagnosis not present

## 2020-04-22 DIAGNOSIS — Z9079 Acquired absence of other genital organ(s): Secondary | ICD-10-CM | POA: Diagnosis not present

## 2020-04-22 DIAGNOSIS — Z9221 Personal history of antineoplastic chemotherapy: Secondary | ICD-10-CM | POA: Diagnosis not present

## 2020-04-22 DIAGNOSIS — Z7901 Long term (current) use of anticoagulants: Secondary | ICD-10-CM | POA: Diagnosis not present

## 2020-04-22 DIAGNOSIS — R202 Paresthesia of skin: Secondary | ICD-10-CM | POA: Diagnosis not present

## 2020-04-22 DIAGNOSIS — Z90722 Acquired absence of ovaries, bilateral: Secondary | ICD-10-CM

## 2020-04-22 DIAGNOSIS — F17210 Nicotine dependence, cigarettes, uncomplicated: Secondary | ICD-10-CM | POA: Diagnosis not present
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
## 2020-04-22 NOTE — Progress Notes (Signed)
Pt states that she has no pain but tired when she gets up and moves around. She has really bad night sweats every night and changes her clothes and bed linen. She also says she has brownish drainage or gel like 2 times a day since surgery

## 2020-04-22 NOTE — Patient Instructions (Signed)
Menopause Menopause is the normal time of life when menstrual periods stop completely. It is usually confirmed by 12 months without a menstrual period. The transition to menopause (perimenopause) most often happens between the ages of 81 and 80. During perimenopause, hormone levels change in your body, which can cause symptoms and affect your health. Menopause may increase your risk for:  Loss of bone (osteoporosis), which causes bone breaks (fractures).  Depression.  Hardening and narrowing of the arteries (atherosclerosis), which can cause heart attacks and strokes. What are the causes? This condition is usually caused by a natural change in hormone levels that happens as you get older. The condition may also be caused by surgery to remove both ovaries (bilateral oophorectomy). What increases the risk? This condition is more likely to start at an earlier age if you have certain medical conditions or treatments, including:  A tumor of the pituitary gland in the brain.  A disease that affects the ovaries and hormone production.  Radiation treatment for cancer.  Certain cancer treatments, such as chemotherapy or hormone (anti-estrogen) therapy.  Heavy smoking and excessive alcohol use.  Family history of early menopause. This condition is also more likely to develop earlier in women who are very thin. What are the signs or symptoms? Symptoms of this condition include:  Hot flashes.  Irregular menstrual periods.  Night sweats.  Changes in feelings about sex. This could be a decrease in sex drive or an increased comfort around your sexuality.  Vaginal dryness and thinning of the vaginal walls. This may cause painful intercourse.  Dryness of the skin and development of wrinkles.  Headaches.  Problems sleeping (insomnia).  Mood swings or irritability.  Memory problems.  Weight gain.  Hair growth on the face and chest.  Bladder infections or problems with urinating. How  is this diagnosed? This condition is diagnosed based on your medical history, a physical exam, your age, your menstrual history, and your symptoms. Hormone tests may also be done. How is this treated? In some cases, no treatment is needed. You and your health care provider should make a decision together about whether treatment is necessary. Treatment will be based on your individual condition and preferences. Treatment for this condition focuses on managing symptoms. Treatment may include:  Menopausal hormone therapy (MHT).  Medicines to treat specific symptoms or complications.  Acupuncture.  Vitamin or herbal supplements. Before starting treatment, make sure to let your health care provider know if you have a personal or family history of:  Heart disease.  Breast cancer.  Blood clots.  Diabetes.  Osteoporosis. Follow these instructions at home: Lifestyle  Do not use any products that contain nicotine or tobacco, such as cigarettes and e-cigarettes. If you need help quitting, ask your health care provider.  Get at least 30 minutes of physical activity on 5 or more days each week.  Avoid alcoholic and caffeinated beverages, as well as spicy foods. This may help prevent hot flashes.  Get 7-8 hours of sleep each night.  If you have hot flashes, try: ? Dressing in layers. ? Avoiding things that may trigger hot flashes, such as spicy food, warm places, or stress. ? Taking slow, deep breaths when a hot flash starts. ? Keeping a fan in your home and office.  Find ways to manage stress, such as deep breathing, meditation, or journaling.  Consider going to group therapy with other women who are having menopause symptoms. Ask your health care provider about recommended group therapy meetings. Eating and  drinking  Eat a healthy, balanced diet that contains whole grains, lean protein, low-fat dairy, and plenty of fruits and vegetables.  Your health care provider may recommend  adding more soy to your diet. Foods that contain soy include tofu, tempeh, and soy milk.  Eat plenty of foods that contain calcium and vitamin D for bone health. Items that are rich in calcium include low-fat milk, yogurt, beans, almonds, sardines, broccoli, and kale. Medicines  Take over-the-counter and prescription medicines only as told by your health care provider.  Talk with your health care provider before starting any herbal supplements. If prescribed, take vitamins and supplements as told by your health care provider. These may include: ? Calcium. Women age 36 and older should get 1,200 mg (milligrams) of calcium every day. ? Vitamin D. Women need 600-800 International Units of vitamin D each day. ? Vitamins B12 and B6. Aim for 50 micrograms of B12 and 1.5 mg of B6 each day. General instructions  Keep track of your menstrual periods, including: ? When they occur. ? How heavy they are and how long they last. ? How much time passes between periods.  Keep track of your symptoms, noting when they start, how often you have them, and how long they last.  Use vaginal lubricants or moisturizers to help with vaginal dryness and improve comfort during sex.  Keep all follow-up visits as told by your health care provider. This is important. This includes any group therapy or counseling. Contact a health care provider if:  You are still having menstrual periods after age 58.  You have pain during sex.  You have not had a period for 12 months and you develop vaginal bleeding. Get help right away if:  You have: ? Severe depression. ? Excessive vaginal bleeding. ? Pain when you urinate. ? A fast or irregular heart beat (palpitations). ? Severe headaches. ? Abdomen (abdominal) pain or severe indigestion.  You fell and you think you have a broken bone.  You develop leg or chest pain.  You develop vision problems.  You feel a lump in your breast. Summary  Menopause is the normal  time of life when menstrual periods stop completely. It is usually confirmed by 12 months without a menstrual period.  The transition to menopause (perimenopause) most often happens between the ages of 42 and 22.  Symptoms can be managed through medicines, lifestyle changes, and complementary therapies such as acupuncture.  Eat a balanced diet that is rich in nutrients to promote bone health and heart health and to manage symptoms during menopause. This information is not intended to replace advice given to you by your health care provider. Make sure you discuss any questions you have with your health care provider. Document Revised: 11/24/2017 Document Reviewed: 01/14/2017 Elsevier Patient Education  2020 [employer].  Treatment option Clonidine, a centrally active alpha-2 adrenergic agonist, was modestly more effective than placebo in a meta-analysis of 10 trials [55]. Clonidine is now used infrequently, given its side-effect profile (dry mouth, dizziness, constipation, and sedation) and the availability of other effective nonhormonal medications for hot flashes. If used (ie, if all other options are ineffective or not tolerated), the transdermal preparations might be superior to oral tablets because of stable blood levels. We suggest starting with the TTS-1 (transdermal therapeutic system 1) patch, which delivers 0.1 mg per day, and increasing to TTS-2 (0.2 mg/day) and TTS-3 (0.3 mg/day) based on efficacy and side effects. Oral clonidine, at a dose of 0.1 to 1 mg/day  in divided doses, may also be used (and may be substantially less expensive).  We checked with our pharmacist and this medication can interact with the metoprolol (LOPRESSOR). Please review with your primary care doctor before starting the clonidine medication.   Clonidine Skin Patches What is this medicine? CLONIDINE (KLOE ni deen) treats high blood pressure. This medicine may be used for other purposes; ask your health care  provider or pharmacist if you have questions. COMMON BRAND NAME(S): Catapres-TTS What should I tell my health care provider before I take this medicine? They need to know if you have any of these conditions:  kidney disease  an unusual or allergic reaction to clonidine, other medicines, foods, dyes, or preservatives  pregnant or trying to get pregnant  breast-feeding How should I use this medicine? This medicine is for external use only. Follow the directions on the prescription label. Apply the patch to an area of the upper arm or part of the body that is clean, dry and hairless. Avoid injured, irritated, calloused, or scarred areas. Use a different site each time to prevent skin irritation. Do not cut or trim the patch. One patch should last for 7 days. Do not use your medicine more often than directed. Do not stop using except on the advice of your doctor or health care professional. You must gradually reduce the dose or you may get a dangerous increase in blood pressure. Talk to your pediatrician regarding the use of this medicine in children. Special care may be needed. Overdosage: If you think you have taken too much of this medicine contact a poison control center or emergency room at once. NOTE: This medicine is only for you. Do not share this medicine with others. What if I miss a dose? Replace each patch on the same day of each week, or if the patch falls off. If you do forget to change the patch for two or three days, check with your doctor or health care professional. What may interact with this medicine? Do not take this medicine with any of the following medications:  MAOIs like Carbex, Eldepryl, Marplan, Nardil, and Parnate This medicine may also interact with the following medications:  barbiturate medicines for inducing sleep or treating seizures like phenobarbital  certain medicines for blood pressure, heart disease, irregular heart beat  certain medicines for  depression, anxiety, or psychotic disturbances  prescription pain medicines This list may not describe all possible interactions. Give your health care provider a list of all the medicines, herbs, non-prescription drugs, or dietary supplements you use. Also tell them if you smoke, drink alcohol, or use illegal drugs. Some items may interact with your medicine. What should I watch for while using this medicine? Visit your doctor or health care professional for regular checks on your progress. Check your heart rate and blood pressure regularly while you are using this medicine. Ask your doctor or health care professional what your heart rate should be and when you should contact him or her. You can shower or bathe with the skin patch in position. If the patch gets loose, cover it with the extra adhesive overlay provided. You may get drowsy or dizzy. Do not drive, use machinery, or do anything that needs mental alertness until you know how this medicine affects you. To avoid dizzy or fainting spells, do not stand or sit up quickly, especially if you are an older person. Alcohol can make you more drowsy and dizzy. Avoid alcoholic drinks. Your mouth may get dry. Chewing  sugarless gum or sucking hard candy, and drinking plenty of water will help. Do not treat yourself for coughs, colds, or pain while you are using this medicine without asking your doctor or health care professional for advice. Some ingredients may increase your blood pressure. If you are going to have surgery tell your doctor or health care professional that you are using this medicine. If you are going to have a magnetic resonance imaging (MRI) procedure, tell your MRI technician if you have this patch on your body. It must be removed before a MRI. What side effects may I notice from receiving this medicine? Side effects that you should report to your doctor or health care professional as soon as possible:  allergic reactions like skin  rash, itching or hives, swelling of the face, lips, or tongue  anxiety, nervousness  chest pain  depression  fast, irregular heartbeat  swelling of feet or legs  unusually weak or tired Side effects that usually do not require medical attention (report to your doctor or health care professional if they continue or are bothersome):  change in sex drive or performance  constipation  headache  skin redness, irritation, or darkening under the patch area This list may not describe all possible side effects. Call your doctor for medical advice about side effects. You may report side effects to FDA at 1-800-FDA-1088. Where should I keep my medicine? Keep out of the reach of children. Store at room temperature between 15 and 30 degrees C (59 to 86 degrees F). Throw away any unused medicine after the expiration date. NOTE: This sheet is a summary. It may not cover all possible information. If you have questions about this medicine, talk to your doctor, pharmacist, or health care provider.  2020 Elsevier/Gold Standard (2019-08-20 29:92:42)

## 2020-04-23 ENCOUNTER — Ambulatory Visit
Admission: RE | Admit: 2020-04-23 | Discharge: 2020-04-23 | Disposition: A | Discharge status: Home / Self Care | Payer: Medicaid Other | Attending: Vascular Surgery | Admitting: Vascular Surgery

## 2020-04-23 ENCOUNTER — Other Ambulatory Visit: Payer: Self-pay

## 2020-04-23 ENCOUNTER — Encounter
Admission: RE | Disposition: A | Discharge status: Home / Self Care | Payer: Self-pay | Source: Home / Self Care | Attending: Vascular Surgery

## 2020-04-23 ENCOUNTER — Encounter: Payer: Self-pay | Admitting: Vascular Surgery

## 2020-04-23 DIAGNOSIS — Z801 Family history of malignant neoplasm of trachea, bronchus and lung: Secondary | ICD-10-CM | POA: Insufficient documentation

## 2020-04-23 DIAGNOSIS — F17210 Nicotine dependence, cigarettes, uncomplicated: Secondary | ICD-10-CM | POA: Diagnosis not present

## 2020-04-23 DIAGNOSIS — Z8543 Personal history of malignant neoplasm of ovary: Secondary | ICD-10-CM | POA: Diagnosis not present

## 2020-04-23 DIAGNOSIS — Z806 Family history of leukemia: Secondary | ICD-10-CM | POA: Diagnosis not present

## 2020-04-23 DIAGNOSIS — Z803 Family history of malignant neoplasm of breast: Secondary | ICD-10-CM | POA: Diagnosis not present

## 2020-04-23 DIAGNOSIS — Z85118 Personal history of other malignant neoplasm of bronchus and lung: Secondary | ICD-10-CM | POA: Diagnosis not present

## 2020-04-23 DIAGNOSIS — Z79899 Other long term (current) drug therapy: Secondary | ICD-10-CM | POA: Diagnosis not present

## 2020-04-23 DIAGNOSIS — Z800 Family history of malignant neoplasm of digestive organs: Secondary | ICD-10-CM | POA: Insufficient documentation

## 2020-04-23 DIAGNOSIS — Z8042 Family history of malignant neoplasm of prostate: Secondary | ICD-10-CM | POA: Insufficient documentation

## 2020-04-23 DIAGNOSIS — Z1501 Genetic susceptibility to malignant neoplasm of breast: Secondary | ICD-10-CM | POA: Diagnosis not present

## 2020-04-23 DIAGNOSIS — I82621 Acute embolism and thrombosis of deep veins of right upper extremity: Secondary | ICD-10-CM | POA: Diagnosis not present

## 2020-04-23 DIAGNOSIS — Z7901 Long term (current) use of anticoagulants: Secondary | ICD-10-CM | POA: Insufficient documentation

## 2020-04-23 DIAGNOSIS — I10 Essential (primary) hypertension: Secondary | ICD-10-CM | POA: Insufficient documentation

## 2020-04-23 DIAGNOSIS — C482 Malignant neoplasm of peritoneum, unspecified: Secondary | ICD-10-CM | POA: Insufficient documentation

## 2020-04-23 DIAGNOSIS — K219 Gastro-esophageal reflux disease without esophagitis: Secondary | ICD-10-CM | POA: Diagnosis not present

## 2020-04-23 DIAGNOSIS — C786 Secondary malignant neoplasm of retroperitoneum and peritoneum: Secondary | ICD-10-CM

## 2020-04-23 HISTORY — PX: PORTA CATH INSERTION: CATH118285

## 2020-04-23 SURGERY — PORTA CATH INSERTION
Anesthesia: Moderate Sedation

## 2020-04-23 MED ORDER — MIDAZOLAM HCL 2 MG/ML PO SYRP: 8.0000 mg | ORAL_SOLUTION | Freq: Once | ORAL | Status: DC | PRN

## 2020-04-23 MED ORDER — SODIUM CHLORIDE 0.9 % IV SOLN: INTRAVENOUS | Status: DC

## 2020-04-23 MED ORDER — METHYLPREDNISOLONE SODIUM SUCC 125 MG IJ SOLR: 125.0000 mg | Freq: Once | INTRAMUSCULAR | Status: DC | PRN

## 2020-04-23 MED ORDER — FAMOTIDINE 20 MG PO TABS: 40.0000 mg | ORAL_TABLET | Freq: Once | ORAL | Status: DC | PRN

## 2020-04-23 MED ORDER — DIPHENHYDRAMINE HCL 50 MG/ML IJ SOLN: 50.0000 mg | Freq: Once | INTRAMUSCULAR | Status: DC | PRN

## 2020-04-23 MED ORDER — MIDAZOLAM HCL 5 MG/5ML IJ SOLN
INTRAMUSCULAR | Status: AC
  Filled 2020-04-23: qty 5

## 2020-04-23 MED ORDER — CEFAZOLIN SODIUM-DEXTROSE 2-4 GM/100ML-% IV SOLN
2.0000 g | Freq: Once | INTRAVENOUS | Status: AC
  Administered 2020-04-23: 2 g via INTRAVENOUS

## 2020-04-23 MED ORDER — FENTANYL CITRATE (PF) 100 MCG/2ML IJ SOLN
INTRAMUSCULAR | Status: AC
  Filled 2020-04-23: qty 2

## 2020-04-23 MED ORDER — SODIUM CHLORIDE 0.9 % IV SOLN
Freq: Once | INTRAVENOUS | Status: DC
  Filled 2020-04-23: qty 2

## 2020-04-23 MED ORDER — ONDANSETRON HCL 4 MG/2ML IJ SOLN: 4.0000 mg | Freq: Four times a day (QID) | INTRAMUSCULAR | Status: DC | PRN

## 2020-04-23 MED ORDER — HYDROMORPHONE HCL 1 MG/ML IJ SOLN: 1.0000 mg | Freq: Once | INTRAMUSCULAR | Status: DC | PRN

## 2020-04-23 MED ORDER — CEFAZOLIN SODIUM-DEXTROSE 2-4 GM/100ML-% IV SOLN
INTRAVENOUS | Status: AC
  Filled 2020-04-23: qty 100

## 2020-04-23 MED ORDER — FENTANYL CITRATE (PF) 100 MCG/2ML IJ SOLN
INTRAMUSCULAR | Status: DC | PRN
  Administered 2020-04-23: 25 ug via INTRAVENOUS
  Administered 2020-04-23: 50 ug via INTRAVENOUS

## 2020-04-23 MED ORDER — MIDAZOLAM HCL 2 MG/2ML IJ SOLN
INTRAMUSCULAR | Status: DC | PRN
  Administered 2020-04-23: 2 mg via INTRAVENOUS
  Administered 2020-04-23: 1 mg via INTRAVENOUS

## 2020-04-23 SURGICAL SUPPLY — 9 items
ELECT REM PT RETURN 9FT ADLT (ELECTROSURGICAL) ×2
ELECTRODE REM PT RTRN 9FT ADLT (ELECTROSURGICAL) ×1 IMPLANT
KIT PORT POWER 8FR ISP CVUE (Port) ×2 IMPLANT
PACK ANGIOGRAPHY (CUSTOM PROCEDURE TRAY) ×1 IMPLANT
PENCIL ELECTRO HAND CTR (MISCELLANEOUS) ×2 IMPLANT
SUT MNCRL AB 4-0 PS2 18 (SUTURE) ×2 IMPLANT
SUT PROLENE 0 CT 1 30 (SUTURE) ×2 IMPLANT
SUT VIC AB 3-0 SH 27 (SUTURE) ×2
SUT VIC AB 3-0 SH 27X BRD (SUTURE) ×1 IMPLANT

## 2020-04-23 NOTE — H&P (Signed)
[facility] History & Physical Update  The patient was interviewed and re-examined.  The patient's previous History and Physical has been reviewed and is unchanged.  There is no change in the plan of care. We plan to proceed with the scheduled procedure.  Leotis Pain, MD  04/23/2020, 7:36 AM

## 2020-04-23 NOTE — Op Note (Signed)
      [facility]       Operative Note  Date: 04/23/2020  Preoperative diagnosis:  1. Peritoneal carcinomatosis  Postoperative diagnosis:  Same as above  Procedures: #1. Ultrasound guidance for vascular access to the right internal jugular vein. #2. Fluoroscopic guidance for placement of catheter. #3. Placement of CT compatible Port-A-Cath, right internal jugular vein.  Surgeon: Leotis Pain, MD.   Anesthesia: Local with moderate conscious sedation for approximately 20  minutes using 3 mg of Versed and 75 mcg of Fentanyl  Fluoroscopy time: less than 1 minute  Contrast used: 0  Estimated blood loss: 3 cc  Indication for the procedure:  The patient is a 49 y.o.female with peritoneal carcinomatosis.  The patient needs a Port-A-Cath for durable venous access, chemotherapy, lab draws, and CT scans. We are asked to place this. Risks and benefits were discussed and informed consent was obtained.  Description of procedure: The patient was brought to the vascular and interventional radiology suite.  Moderate conscious sedation was administered throughout the procedure during a face to face encounter with the patient with my supervision of the RN administering medicines and monitoring the patient's vital signs, pulse oximetry, telemetry and mental status throughout from the start of the procedure until the patient was taken to the recovery room. The right neck chest and shoulder were sterilely prepped and draped, and a sterile surgical field was created. Ultrasound was used to help visualize a patent right internal jugular vein. This was then accessed under direct ultrasound guidance without difficulty with the Seldinger needle and a permanent image was recorded. A J-wire was placed. After skin nick and dilatation, the peel-away sheath was then placed over the wire. I then anesthetized an area under the clavicle approximately 1-2 fingerbreadths. A transverse incision was created  and an inferior pocket was created with electrocautery and blunt dissection. The port was then brought onto the field, placed into the pocket and secured to the chest wall with 2 Prolene sutures. The catheter was connected to the port and tunneled from the subclavicular incision to the access site. Fluoroscopic guidance was then used to cut the catheter to an appropriate length. The catheter was then placed through the peel-away sheath and the peel-away sheath was removed. The catheter tip was parked in excellent location under fluorocoscopic guidance in the cavoatrial junction. The pocket was then irrigated with antibiotic impregnated saline and the wound was closed with a running 3-0 Vicryl and a 4-0 Monocryl. The access incision was closed with a single 4-0 Monocryl. The Huber needle was used to withdraw blood and flush the port with heparinized saline. Dermabond was then placed as a dressing. The patient tolerated the procedure well and was taken to the recovery room in stable condition.   Leotis Pain 04/23/2020 10:39 AM   This note was created with Dragon Medical transcription system. Any errors in dictation are purely unintentional.

## 2020-04-24 ENCOUNTER — Other Ambulatory Visit: Payer: Self-pay

## 2020-04-24 ENCOUNTER — Telehealth: Payer: Self-pay

## 2020-04-24 ENCOUNTER — Ambulatory Visit: Payer: Medicaid Other | Admitting: Cardiology

## 2020-04-24 MED ORDER — CLONIDINE 0.1 MG/24HR TD PTWK: 0.1000 mg | MEDICATED_PATCH | TRANSDERMAL | 3 refills | Status: DC

## 2020-04-24 NOTE — Telephone Encounter (Signed)
PA received for Clonidine patch. Submitted for review.

## 2020-04-24 NOTE — Telephone Encounter (Signed)
Received call from Ms. Sultan. She would like to proceed with trying Clonidine patch. Clonidine TTS 0.1mg /24hr sent to pharmacy. Instructed to apply to upper arm or chest and to change patch every 7 days. Per Dr. Ancil Boozer, we need to monitor for a decrease in blood pressure. Her blood pressure medication was recently decreased. Unsure of reason. Blood pressure elevated at last visit. We will monitor blood pressure during visits.

## 2020-04-27 ENCOUNTER — Other Ambulatory Visit: Payer: Self-pay | Admitting: Licensed Clinical Social Worker

## 2020-04-27 DIAGNOSIS — Z95828 Presence of other vascular implants and grafts: Secondary | ICD-10-CM

## 2020-04-27 DIAGNOSIS — Z452 Encounter for adjustment and management of vascular access device: Secondary | ICD-10-CM

## 2020-04-27 MED ORDER — LIDOCAINE-PRILOCAINE 2.5-2.5 % EX CREA: 1.0000 "application " | TOPICAL_CREAM | CUTANEOUS | 3 refills | Status: DC | PRN

## 2020-04-27 NOTE — Progress Notes (Signed)
Pharmacist Chemotherapy Monitoring - Follow Up Assessment    I verify that I have reviewed each item in the below checklist:  . Regimen for the patient is scheduled for the appropriate day and plan matches scheduled date. Marland Kitchen Appropriate non-routine labs are ordered dependent on drug ordered. . If applicable, additional medications reviewed and ordered per protocol based on lifetime cumulative doses and/or treatment regimen.   Plan for follow-up and/or issues identified: Yes . I-vent associated with next due treatment: Yes . MD and/or nursing notified: No  Shaena Parkerson K 04/27/2020 8:47 AM

## 2020-04-28 ENCOUNTER — Other Ambulatory Visit: Payer: Self-pay

## 2020-04-29 ENCOUNTER — Ambulatory Visit: Payer: Medicaid Other | Attending: Internal Medicine

## 2020-04-29 ENCOUNTER — Other Ambulatory Visit: Payer: Self-pay

## 2020-04-29 ENCOUNTER — Telehealth: Payer: Self-pay

## 2020-04-29 DIAGNOSIS — Z23 Encounter for immunization: Secondary | ICD-10-CM

## 2020-04-29 LAB — SURGICAL PATHOLOGY

## 2020-04-29 MED ORDER — CLONIDINE 0.1 MG/24HR TD PTWK: 0.1000 mg | MEDICATED_PATCH | TRANSDERMAL | 3 refills | Status: DC

## 2020-04-29 NOTE — Telephone Encounter (Signed)
Pathology from 04/08/20 surgery has resulted. Sent to Dr. Theora Gianotti for review.

## 2020-04-29 NOTE — Progress Notes (Signed)
   Covid-[facility] Vaccination [facility]  Name:  [name]    MRN: [id number] DOB: Apr 26, 1971  04/29/2020  Ms. Kidney was observed post Covid-19 immunization for 15 minutes without incident. She was provided with Vaccine Information Sheet and instruction to access the V-Safe system.   [name] was instructed to call 911 with any severe reactions post vaccine: Marland Kitchen Difficulty breathing  . Swelling of face and throat  . A fast heartbeat  . A bad rash all over body  . Dizziness and weakness   Immunizations Administered    Name Date Dose VIS Date Route   Moderna COVID-19 Vaccine 04/29/2020 10:29 AM 0.5 mL 11/2019 Intramuscular   Manufacturer: Moderna   Lot: [id number]   [place]: 39122-583-46

## 2020-04-29 NOTE — Progress Notes (Signed)
Per insurance Clonidine is not covered. Brand name Catapres is covered. Spoke with Dr. Theora Gianotti and new script sent for Catapres tts 0.1mg /24h. Insurance reference call [id number] and [id number].

## 2020-04-30 ENCOUNTER — Other Ambulatory Visit: Payer: Self-pay | Admitting: Hospice and Palliative Medicine

## 2020-04-30 MED ORDER — OXYCODONE HCL 5 MG PO TABS: 5.0000 mg | ORAL_TABLET | Freq: Three times a day (TID) | ORAL | 0 refills | Status: DC | PRN

## 2020-04-30 NOTE — Progress Notes (Signed)
Spoke with patient by phone.  She requested refill of oxycodone.  PDMP reviewed.  Refill request is appropriate.  She generally has significant bone and muscle pain associated after her chemotherapy treatments.  We talked about use of acetaminophen for pain on nontreatment days.  She is also historically taken tramadol and explained that I could not prescribe two short acting opioids together.  She requested oxycodone in lieu of the tramadol.

## 2020-05-01 ENCOUNTER — Other Ambulatory Visit: Payer: Self-pay | Admitting: *Deleted

## 2020-05-01 DIAGNOSIS — C482 Malignant neoplasm of peritoneum, unspecified: Secondary | ICD-10-CM

## 2020-05-04 ENCOUNTER — Inpatient Hospital Stay: Payer: Medicaid Other

## 2020-05-04 ENCOUNTER — Encounter: Payer: Self-pay | Admitting: Internal Medicine

## 2020-05-04 ENCOUNTER — Inpatient Hospital Stay (HOSPITAL_BASED_OUTPATIENT_CLINIC_OR_DEPARTMENT_OTHER): Payer: Medicaid Other | Admitting: Internal Medicine

## 2020-05-04 ENCOUNTER — Other Ambulatory Visit: Payer: Self-pay

## 2020-05-04 ENCOUNTER — Inpatient Hospital Stay: Payer: Medicaid Other | Attending: Internal Medicine

## 2020-05-04 ENCOUNTER — Telehealth: Payer: Self-pay

## 2020-05-04 DIAGNOSIS — Z5111 Encounter for antineoplastic chemotherapy: Secondary | ICD-10-CM | POA: Diagnosis not present

## 2020-05-04 DIAGNOSIS — Z7189 Other specified counseling: Secondary | ICD-10-CM

## 2020-05-04 DIAGNOSIS — Z86718 Personal history of other venous thrombosis and embolism: Secondary | ICD-10-CM | POA: Insufficient documentation

## 2020-05-04 DIAGNOSIS — C482 Malignant neoplasm of peritoneum, unspecified: Secondary | ICD-10-CM

## 2020-05-04 DIAGNOSIS — F17210 Nicotine dependence, cigarettes, uncomplicated: Secondary | ICD-10-CM | POA: Insufficient documentation

## 2020-05-04 DIAGNOSIS — Z90710 Acquired absence of both cervix and uterus: Secondary | ICD-10-CM | POA: Insufficient documentation

## 2020-05-04 DIAGNOSIS — Z90722 Acquired absence of ovaries, bilateral: Secondary | ICD-10-CM | POA: Insufficient documentation

## 2020-05-04 DIAGNOSIS — Z7901 Long term (current) use of anticoagulants: Secondary | ICD-10-CM | POA: Diagnosis not present

## 2020-05-04 LAB — CBC WITH DIFFERENTIAL/PLATELET
Abs Immature Granulocytes: 0 10*3/uL (ref 0.00–0.07)
Basophils Absolute: 0.2 10*3/uL — ABNORMAL HIGH (ref 0.0–0.1)
Basophils Relative: 4 %
Eosinophils Absolute: 0.8 10*3/uL — ABNORMAL HIGH (ref 0.0–0.5)
Eosinophils Relative: 16 %
HCT: 36.4 % (ref 36.0–46.0)
Hemoglobin: 11.9 g/dL — ABNORMAL LOW (ref 12.0–15.0)
Lymphocytes Relative: 43 %
Lymphs Abs: 2 10*3/uL (ref 0.7–4.0)
MCH: 33.8 pg (ref 26.0–34.0)
MCHC: 32.7 g/dL (ref 30.0–36.0)
MCV: 103.4 fL — ABNORMAL HIGH (ref 80.0–100.0)
Monocytes Absolute: 0.2 10*3/uL (ref 0.1–1.0)
Monocytes Relative: 5 %
Neutro Abs: 1.5 10*3/uL — ABNORMAL LOW (ref 1.7–7.7)
Neutrophils Relative %: 32 %
Platelets: 178 10*3/uL (ref 150–400)
RBC: 3.52 MIL/uL — ABNORMAL LOW (ref 3.87–5.11)
RDW: 14.5 % (ref 11.5–15.5)
Smear Review: ADEQUATE
WBC: 4.7 10*3/uL (ref 4.0–10.5)
nRBC: 0 % (ref 0.0–0.2)

## 2020-05-04 LAB — COMPREHENSIVE METABOLIC PANEL
ALT: 14 U/L (ref 0–44)
AST: 18 U/L (ref 15–41)
Albumin: 3.7 g/dL (ref 3.5–5.0)
Alkaline Phosphatase: 64 U/L (ref 38–126)
Anion gap: 9 (ref 5–15)
BUN: 14 mg/dL (ref 6–20)
CO2: 25 mmol/L (ref 22–32)
Calcium: 8.8 mg/dL — ABNORMAL LOW (ref 8.9–10.3)
Chloride: 102 mmol/L (ref 98–111)
Creatinine, Ser: 0.69 mg/dL (ref 0.44–1.00)
GFR calc Af Amer: >60 mL/min (ref >60)
GFR calc non Af Amer: >60 mL/min (ref >60)
Glucose, Bld: 110 mg/dL — ABNORMAL HIGH (ref 70–99)
Potassium: 3.9 mmol/L (ref 3.5–5.1)
Sodium: 136 mmol/L (ref 135–145)
Total Bilirubin: 0.4 mg/dL (ref 0.3–1.2)
Total Protein: 7.7 g/dL (ref 6.5–8.1)

## 2020-05-04 MED ORDER — HEPARIN SOD (PORK) LOCK FLUSH 100 UNIT/ML IV SOLN
INTRAVENOUS | Status: AC
  Filled 2020-05-04: qty 5

## 2020-05-04 MED ORDER — SODIUM CHLORIDE 0.9 % IV SOLN
750.0000 mg | Freq: Once | INTRAVENOUS | Status: AC
  Administered 2020-05-04: 750 mg via INTRAVENOUS
  Filled 2020-05-04: qty 75

## 2020-05-04 MED ORDER — SODIUM CHLORIDE 0.9% FLUSH
10.0000 mL | INTRAVENOUS | Status: DC | PRN
  Administered 2020-05-04: 10 mL
  Filled 2020-05-04: qty 10

## 2020-05-04 MED ORDER — SODIUM CHLORIDE 0.9 % IV SOLN
200.0000 mg/m2 | Freq: Once | INTRAVENOUS | Status: AC
  Administered 2020-05-04: 456 mg via INTRAVENOUS
  Filled 2020-05-04: qty 76

## 2020-05-04 MED ORDER — PALONOSETRON HCL INJECTION 0.25 MG/5ML
0.2500 mg | Freq: Once | INTRAVENOUS | Status: AC
  Administered 2020-05-04: 0.25 mg via INTRAVENOUS
  Filled 2020-05-04: qty 5

## 2020-05-04 MED ORDER — SODIUM CHLORIDE 0.9 % IV SOLN
10.0000 mg | Freq: Once | INTRAVENOUS | Status: AC
  Administered 2020-05-04: 10 mg via INTRAVENOUS
  Filled 2020-05-04: qty 10

## 2020-05-04 MED ORDER — HEPARIN SOD (PORK) LOCK FLUSH 100 UNIT/ML IV SOLN
500.0000 [IU] | Freq: Once | INTRAVENOUS | Status: AC | PRN
  Administered 2020-05-04: 500 [IU]
  Filled 2020-05-04: qty 5

## 2020-05-04 MED ORDER — RIVAROXABAN 10 MG PO TABS: 10.0000 mg | ORAL_TABLET | Freq: Every day | ORAL | 1 refills | Status: DC

## 2020-05-04 MED ORDER — FAMOTIDINE IN NACL 20-0.9 MG/50ML-% IV SOLN
20.0000 mg | Freq: Once | INTRAVENOUS | Status: AC
  Administered 2020-05-04: 20 mg via INTRAVENOUS
  Filled 2020-05-04: qty 50

## 2020-05-04 MED ORDER — DIPHENHYDRAMINE HCL 50 MG/ML IJ SOLN
50.0000 mg | Freq: Once | INTRAMUSCULAR | Status: AC
  Administered 2020-05-04: 50 mg via INTRAVENOUS
  Filled 2020-05-04: qty 1

## 2020-05-04 MED ORDER — SODIUM CHLORIDE 0.9 % IV SOLN
Freq: Once | INTRAVENOUS | Status: AC
  Filled 2020-05-04: qty 250

## 2020-05-04 MED ORDER — SODIUM CHLORIDE 0.9 % IV SOLN
150.0000 mg | Freq: Once | INTRAVENOUS | Status: AC
  Administered 2020-05-04: 150 mg via INTRAVENOUS
  Filled 2020-05-04: qty 150
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
## 2020-05-04 NOTE — Assessment & Plan Note (Addendum)
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 Currently s/p neoadjuvant chemotherapy; debulking surgery on 4/14.   Clinically-improving; April 2021- 67[down from 8000 baseline].   Proceed with adjuvant therapy carbotaxol today. Labs today reviewed;  acceptable for treatment today.  We will plan to start Avastin at next visit.  Patient is a candidate for maintenance therapy.  # right upper extremity DVT [ [jan 15th 2021]; currently on Xarelto 20 mg/day post surgery x1 month; until may 15th; and then will start on xarelto 10 mg/day [porphylatic dose]    # DISPOSITION:  # proceed with chemo today.  #Follow-up in 3 weeks-MD labs-CBC CMP CA 125; UA; carbotaxol-Avaston;  Dr.B

## 2020-05-04 NOTE — Telephone Encounter (Signed)
lmom to schedule

## 2020-05-05 ENCOUNTER — Ambulatory Visit: Payer: Medicaid Other

## 2020-05-05 LAB — CA 125: Cancer Antigen (CA) 125: 34.2 U/mL (ref 0.0–38.1)

## 2020-05-13 ENCOUNTER — Other Ambulatory Visit: Payer: Self-pay

## 2020-05-13 MED ORDER — CLONIDINE 0.2 MG/24HR TD PTWK: 0.2000 mg | MEDICATED_PATCH | TRANSDERMAL | 3 refills | Status: DC

## 2020-05-14 ENCOUNTER — Other Ambulatory Visit: Payer: Self-pay

## 2020-05-14 ENCOUNTER — Inpatient Hospital Stay (HOSPITAL_BASED_OUTPATIENT_CLINIC_OR_DEPARTMENT_OTHER): Payer: Medicaid Other | Admitting: Hospice and Palliative Medicine

## 2020-05-14 DIAGNOSIS — Z515 Encounter for palliative care: Secondary | ICD-10-CM

## 2020-05-14 NOTE — Progress Notes (Signed)
Was unable to reach patient for virtual visit.  Voicemail left.  Will reschedule.

## 2020-05-18 ENCOUNTER — Telehealth: Payer: Self-pay

## 2020-05-18 NOTE — Telephone Encounter (Signed)
Received call from [name]. She had debulking surgery 04/08/20. She initially had some vaginal spotting that stopped. She has not had any vaginal bleeding since that time. Starting 5/19 she started having vaginal spotting again. Light red in color and not heavy. She is changing her panty liner 2-4 times daily, not due to saturation, due to cleanliness. She has resumed her carbo/taxol. MVASI being held in post op setting. She continues on Xarelto. Last CBC unremarkable. Scheduled for routine follow up 05/27/20. Spoke with Dr. Theora Gianotti. No need for her to worry. It is not unusual for spotting to stop then to resume. She can keep her scheduled follow up on 05/27/20. Notified [name].

## 2020-05-26 ENCOUNTER — Other Ambulatory Visit: Payer: Self-pay | Admitting: Licensed Clinical Social Worker

## 2020-05-26 ENCOUNTER — Telehealth: Payer: Self-pay | Admitting: Pharmacist

## 2020-05-26 ENCOUNTER — Telehealth: Payer: Self-pay | Admitting: Pharmacy Technician

## 2020-05-26 ENCOUNTER — Inpatient Hospital Stay: Payer: Medicaid Other | Attending: Internal Medicine

## 2020-05-26 ENCOUNTER — Encounter: Payer: Self-pay | Admitting: Internal Medicine

## 2020-05-26 ENCOUNTER — Inpatient Hospital Stay: Payer: Medicaid Other

## 2020-05-26 ENCOUNTER — Other Ambulatory Visit: Payer: Self-pay

## 2020-05-26 ENCOUNTER — Inpatient Hospital Stay (HOSPITAL_BASED_OUTPATIENT_CLINIC_OR_DEPARTMENT_OTHER): Payer: Medicaid Other | Admitting: Internal Medicine

## 2020-05-26 DIAGNOSIS — Z90710 Acquired absence of both cervix and uterus: Secondary | ICD-10-CM | POA: Insufficient documentation

## 2020-05-26 DIAGNOSIS — Z5111 Encounter for antineoplastic chemotherapy: Secondary | ICD-10-CM | POA: Insufficient documentation

## 2020-05-26 DIAGNOSIS — C482 Malignant neoplasm of peritoneum, unspecified: Secondary | ICD-10-CM

## 2020-05-26 DIAGNOSIS — D696 Thrombocytopenia, unspecified: Secondary | ICD-10-CM | POA: Diagnosis not present

## 2020-05-26 DIAGNOSIS — Z86718 Personal history of other venous thrombosis and embolism: Secondary | ICD-10-CM | POA: Insufficient documentation

## 2020-05-26 DIAGNOSIS — Z148 Genetic carrier of other disease: Secondary | ICD-10-CM | POA: Insufficient documentation

## 2020-05-26 DIAGNOSIS — Z90722 Acquired absence of ovaries, bilateral: Secondary | ICD-10-CM | POA: Diagnosis not present

## 2020-05-26 DIAGNOSIS — C3411 Malignant neoplasm of upper lobe, right bronchus or lung: Secondary | ICD-10-CM | POA: Insufficient documentation

## 2020-05-26 DIAGNOSIS — F17210 Nicotine dependence, cigarettes, uncomplicated: Secondary | ICD-10-CM | POA: Diagnosis not present

## 2020-05-26 DIAGNOSIS — F5101 Primary insomnia: Secondary | ICD-10-CM

## 2020-05-26 DIAGNOSIS — F329 Major depressive disorder, single episode, unspecified: Secondary | ICD-10-CM | POA: Diagnosis not present

## 2020-05-26 DIAGNOSIS — Z79899 Other long term (current) drug therapy: Secondary | ICD-10-CM | POA: Diagnosis not present

## 2020-05-26 DIAGNOSIS — Z452 Encounter for adjustment and management of vascular access device: Secondary | ICD-10-CM

## 2020-05-26 LAB — COMPREHENSIVE METABOLIC PANEL
ALT: 19 U/L (ref 0–44)
AST: 19 U/L (ref 15–41)
Albumin: 3.6 g/dL (ref 3.5–5.0)
Alkaline Phosphatase: 69 U/L (ref 38–126)
Anion gap: 8 (ref 5–15)
BUN: 10 mg/dL (ref 6–20)
CO2: 25 mmol/L (ref 22–32)
Calcium: 8.9 mg/dL (ref 8.9–10.3)
Chloride: 102 mmol/L (ref 98–111)
Creatinine, Ser: 0.66 mg/dL (ref 0.44–1.00)
GFR calc Af Amer: >60 mL/min (ref >60)
GFR calc non Af Amer: >60 mL/min (ref >60)
Glucose, Bld: 104 mg/dL — ABNORMAL HIGH (ref 70–99)
Potassium: 3.8 mmol/L (ref 3.5–5.1)
Sodium: 135 mmol/L (ref 135–145)
Total Bilirubin: 0.5 mg/dL (ref 0.3–1.2)
Total Protein: 7.3 g/dL (ref 6.5–8.1)

## 2020-05-26 LAB — CBC WITH DIFFERENTIAL/PLATELET
Abs Immature Granulocytes: 0.02 10*3/uL (ref 0.00–0.07)
Basophils Absolute: 0 10*3/uL (ref 0.0–0.1)
Basophils Relative: 1 %
Eosinophils Absolute: 0.1 10*3/uL (ref 0.0–0.5)
Eosinophils Relative: 3 %
HCT: 36.5 % (ref 36.0–46.0)
Hemoglobin: 12 g/dL (ref 12.0–15.0)
Immature Granulocytes: 1 %
Lymphocytes Relative: 40 %
Lymphs Abs: 1.4 10*3/uL (ref 0.7–4.0)
MCH: 32.6 pg (ref 26.0–34.0)
MCHC: 32.9 g/dL (ref 30.0–36.0)
MCV: 99.2 fL (ref 80.0–100.0)
Monocytes Absolute: 0.4 10*3/uL (ref 0.1–1.0)
Monocytes Relative: 12 %
Neutro Abs: 1.6 10*3/uL — ABNORMAL LOW (ref 1.7–7.7)
Neutrophils Relative %: 43 %
Platelets: 69 10*3/uL — ABNORMAL LOW (ref 150–400)
RBC: 3.68 MIL/uL — ABNORMAL LOW (ref 3.87–5.11)
RDW: 14.8 % (ref 11.5–15.5)
Smear Review: NORMAL
WBC: 3.6 10*3/uL — ABNORMAL LOW (ref 4.0–10.5)
nRBC: 0 % (ref 0.0–0.2)

## 2020-05-26 MED ORDER — OLAPARIB 150 MG PO TABS: 300.0000 mg | ORAL_TABLET | Freq: Two times a day (BID) | ORAL | 3 refills | Status: DC

## 2020-05-26 MED ORDER — SODIUM CHLORIDE 0.9% FLUSH
10.0000 mL | Freq: Once | INTRAVENOUS | Status: AC
  Administered 2020-05-26: 10 mL via INTRAVENOUS
  Filled 2020-05-26: qty 10

## 2020-05-26 MED ORDER — HEPARIN SOD (PORK) LOCK FLUSH 100 UNIT/ML IV SOLN
500.0000 [IU] | Freq: Once | INTRAVENOUS | Status: AC
  Administered 2020-05-26: 500 [IU] via INTRAVENOUS
  Filled 2020-05-26: qty 5

## 2020-05-26 MED ORDER — HEPARIN SOD (PORK) LOCK FLUSH 100 UNIT/ML IV SOLN
INTRAVENOUS | Status: AC
  Filled 2020-05-26: qty 5

## 2020-05-26 MED ORDER — TRAZODONE HCL 50 MG PO TABS
25.0000 mg | ORAL_TABLET | Freq: Every evening | ORAL | 2 refills | Status: DC | PRN
Start: 2020-05-26 — End: 2021-08-31

## 2020-05-26 NOTE — Assessment & Plan Note (Addendum)
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 Currently s/p neoadjuvant chemotherapy; debulking surgery on 4/14.   Clinically-improving; April 2021- 68[down from 8000 baseline].  # HOLD   carbotaxol-avastin today. Labs today reviewed;  unacceptable for treatment today-platletes- 69.  Plan chemo- in 1 week.   #Moderate thrombocytopenia platelets 69-from carboplatin chemo; currently also on Xarelto 10 mg a day.  Hold chemotherapy as above  #Discussed regarding starting maintenance therapy with Avastin-olaparib-which would give her the best PFS/especially given her BRCA mutated status.  Again reviewed the potential side effects of olaparib including but not limited to nausea vomiting diarrhea/anemia etc. discussed with Dr. Theora Gianotti.  # right upper extremity DVT [ [jan 15th 2021]; currently xarelto 10 mg/day [porphylatic dose] x10-14 days; and stop.    # DISPOSITION:  # HOLD  chemo today; de-access.   # 1 week-labs-cbc/cmp- carbo-taxol-avastin  #Follow-up in 4 weeks-MD labs-CBC CMP CA 125; UA; Avastin;  Dr.B

## 2020-05-26 NOTE — Telephone Encounter (Signed)
Oral Oncology Patient Advocate Encounter  After completing a benefits investigation, prior authorization for [facility] is not required at this time through [facility].  Patient's copay is $3.00.  [facility] Patient [place] Phone [phone] Fax [phone] 05/26/2020 12:18 PM
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
## 2020-05-26 NOTE — Telephone Encounter (Signed)
Oral Oncology Pharmacist Encounter  Received new prescription for Lynparza (olaparib) for maintenance therapy of peritoneal adenocarcinoma in conjunction with bevacizumab, planned duration until disease progression, unacceptable drug toxicity, or completion of 2 years. Plan to start in ~3 weeks.  CMP/CBC from 05/26/20 assessed, no relevant lab abnormalities. Prescription dose and frequency assessed.   Current medication list in Epic reviewed, no DDIs with olaparib identified.  Prescription has been e-scribed to the [facility] for benefits analysis and approval.  Oral Oncology Clinic will continue to follow for insurance authorization, copayment issues, initial counseling and start date.  Darl Pikes, PharmD, BCPS, BCOP, CPP Hematology/Oncology Clinical Pharmacist Practitioner [facility] [phone]  05/26/2020 11:59 AM

## 2020-05-27 ENCOUNTER — Inpatient Hospital Stay (HOSPITAL_BASED_OUTPATIENT_CLINIC_OR_DEPARTMENT_OTHER): Payer: Medicaid Other | Admitting: Obstetrics and Gynecology

## 2020-05-27 VITALS — BP 111/82 | HR 85 | Temp 97.5°F | Resp 20 | Wt 253.1 lb

## 2020-05-27 DIAGNOSIS — C482 Malignant neoplasm of peritoneum, unspecified: Secondary | ICD-10-CM

## 2020-05-27 DIAGNOSIS — Z1501 Genetic susceptibility to malignant neoplasm of breast: Secondary | ICD-10-CM

## 2020-05-27 DIAGNOSIS — Z90722 Acquired absence of ovaries, bilateral: Secondary | ICD-10-CM

## 2020-05-27 DIAGNOSIS — Z1509 Genetic susceptibility to other malignant neoplasm: Secondary | ICD-10-CM

## 2020-05-27 DIAGNOSIS — Z90710 Acquired absence of both cervix and uterus: Secondary | ICD-10-CM

## 2020-05-27 DIAGNOSIS — Z148 Genetic carrier of other disease: Secondary | ICD-10-CM

## 2020-05-27 LAB — CA 125: Cancer Antigen (CA) 125: 24.6 U/mL (ref 0.0–38.1)
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
## 2020-05-27 NOTE — Progress Notes (Signed)
Patient here today for follow up regarding peritoneal carcinomatosis, surgery follow up. Patient reports continued vaginal spotting and constipation, patient states she was able to have good bowel movement this morning. Patient also reports fatigue today.

## 2020-05-28 ENCOUNTER — Other Ambulatory Visit: Payer: Self-pay | Admitting: *Deleted

## 2020-05-28 MED ORDER — ALPRAZOLAM 0.25 MG PO TABS: 0.2500 mg | ORAL_TABLET | Freq: Two times a day (BID) | ORAL | 0 refills | Status: DC | PRN

## 2020-06-02 ENCOUNTER — Encounter: Payer: Self-pay | Admitting: Internal Medicine

## 2020-06-02 ENCOUNTER — Other Ambulatory Visit: Payer: Self-pay | Admitting: *Deleted

## 2020-06-02 ENCOUNTER — Inpatient Hospital Stay: Payer: Medicaid Other

## 2020-06-02 ENCOUNTER — Telehealth: Payer: Self-pay

## 2020-06-02 ENCOUNTER — Inpatient Hospital Stay (HOSPITAL_BASED_OUTPATIENT_CLINIC_OR_DEPARTMENT_OTHER): Payer: Medicaid Other | Admitting: Internal Medicine

## 2020-06-02 ENCOUNTER — Other Ambulatory Visit: Payer: Self-pay

## 2020-06-02 DIAGNOSIS — C482 Malignant neoplasm of peritoneum, unspecified: Secondary | ICD-10-CM

## 2020-06-02 DIAGNOSIS — Z5111 Encounter for antineoplastic chemotherapy: Secondary | ICD-10-CM | POA: Diagnosis not present

## 2020-06-02 DIAGNOSIS — Z7189 Other specified counseling: Secondary | ICD-10-CM

## 2020-06-02 LAB — CBC WITH DIFFERENTIAL/PLATELET
Abs Immature Granulocytes: 0 10*3/uL (ref 0.00–0.07)
Basophils Absolute: 0 10*3/uL (ref 0.0–0.1)
Basophils Relative: 1 %
Eosinophils Absolute: 0.4 10*3/uL (ref 0.0–0.5)
Eosinophils Relative: 8 %
HCT: 36 % (ref 36.0–46.0)
Hemoglobin: 11.9 g/dL — ABNORMAL LOW (ref 12.0–15.0)
Lymphocytes Relative: 30 %
Lymphs Abs: 1.4 10*3/uL (ref 0.7–4.0)
MCH: 33 pg (ref 26.0–34.0)
MCHC: 33.1 g/dL (ref 30.0–36.0)
MCV: 99.7 fL (ref 80.0–100.0)
Monocytes Absolute: 0.6 10*3/uL (ref 0.1–1.0)
Monocytes Relative: 12 %
Neutro Abs: 2.4 10*3/uL (ref 1.7–7.7)
Neutrophils Relative %: 49 %
Platelets: 130 10*3/uL — ABNORMAL LOW (ref 150–400)
RBC: 3.61 MIL/uL — ABNORMAL LOW (ref 3.87–5.11)
RDW: 15.6 % — ABNORMAL HIGH (ref 11.5–15.5)
WBC: 4.8 10*3/uL (ref 4.0–10.5)
nRBC: 0 % (ref 0.0–0.2)

## 2020-06-02 LAB — URINALYSIS, COMPLETE (UACMP) WITH MICROSCOPIC
Bilirubin Urine: NEGATIVE
Glucose, UA: NEGATIVE mg/dL
Ketones, ur: NEGATIVE mg/dL
Nitrite: NEGATIVE
Protein, ur: NEGATIVE mg/dL
Specific Gravity, Urine: 1.024 (ref 1.005–1.030)
WBC, UA: >50 WBC/hpf — ABNORMAL HIGH (ref 0–5)
pH: 5 (ref 5.0–8.0)

## 2020-06-02 LAB — COMPREHENSIVE METABOLIC PANEL
ALT: 19 U/L (ref 0–44)
AST: 21 U/L (ref 15–41)
Albumin: 3.7 g/dL (ref 3.5–5.0)
Alkaline Phosphatase: 70 U/L (ref 38–126)
Anion gap: 10 (ref 5–15)
BUN: 14 mg/dL (ref 6–20)
CO2: 25 mmol/L (ref 22–32)
Calcium: 9 mg/dL (ref 8.9–10.3)
Chloride: 103 mmol/L (ref 98–111)
Creatinine, Ser: 0.67 mg/dL (ref 0.44–1.00)
GFR calc Af Amer: >60 mL/min (ref >60)
GFR calc non Af Amer: >60 mL/min (ref >60)
Glucose, Bld: 121 mg/dL — ABNORMAL HIGH (ref 70–99)
Potassium: 4.1 mmol/L (ref 3.5–5.1)
Sodium: 138 mmol/L (ref 135–145)
Total Bilirubin: 0.4 mg/dL (ref 0.3–1.2)
Total Protein: 7.5 g/dL (ref 6.5–8.1)

## 2020-06-02 MED ORDER — SODIUM CHLORIDE 0.9 % IV SOLN
200.0000 mg/m2 | Freq: Once | INTRAVENOUS | Status: AC
  Administered 2020-06-02: 456 mg via INTRAVENOUS
  Filled 2020-06-02: qty 76

## 2020-06-02 MED ORDER — SODIUM CHLORIDE 0.9 % IV SOLN
Freq: Once | INTRAVENOUS | Status: AC
  Filled 2020-06-02: qty 250

## 2020-06-02 MED ORDER — DIPHENHYDRAMINE HCL 50 MG/ML IJ SOLN
50.0000 mg | Freq: Once | INTRAMUSCULAR | Status: AC
  Administered 2020-06-02: 50 mg via INTRAVENOUS
  Filled 2020-06-02: qty 1

## 2020-06-02 MED ORDER — SODIUM CHLORIDE 0.9 % IV SOLN
750.0000 mg | Freq: Once | INTRAVENOUS | Status: AC
  Administered 2020-06-02: 750 mg via INTRAVENOUS
  Filled 2020-06-02: qty 75

## 2020-06-02 MED ORDER — HEPARIN SOD (PORK) LOCK FLUSH 100 UNIT/ML IV SOLN
500.0000 [IU] | Freq: Once | INTRAVENOUS | Status: DC | PRN
  Filled 2020-06-02: qty 5

## 2020-06-02 MED ORDER — FAMOTIDINE IN NACL 20-0.9 MG/50ML-% IV SOLN
20.0000 mg | Freq: Once | INTRAVENOUS | Status: AC
  Administered 2020-06-02: 20 mg via INTRAVENOUS
  Filled 2020-06-02: qty 50

## 2020-06-02 MED ORDER — SODIUM CHLORIDE 0.9 % IV SOLN
15.0000 mg/kg | Freq: Once | INTRAVENOUS | Status: AC
  Administered 2020-06-02: 1700 mg via INTRAVENOUS
  Filled 2020-06-02: qty 64

## 2020-06-02 MED ORDER — HEPARIN SOD (PORK) LOCK FLUSH 100 UNIT/ML IV SOLN
500.0000 [IU] | Freq: Once | INTRAVENOUS | Status: AC
  Administered 2020-06-02: 500 [IU] via INTRAVENOUS
  Filled 2020-06-02: qty 5

## 2020-06-02 MED ORDER — SODIUM CHLORIDE 0.9% FLUSH
10.0000 mL | Freq: Once | INTRAVENOUS | Status: AC
  Administered 2020-06-02: 10 mL via INTRAVENOUS
  Filled 2020-06-02: qty 10

## 2020-06-02 MED ORDER — HEPARIN SOD (PORK) LOCK FLUSH 100 UNIT/ML IV SOLN
INTRAVENOUS | Status: AC
  Filled 2020-06-02: qty 5

## 2020-06-02 MED ORDER — PALONOSETRON HCL INJECTION 0.25 MG/5ML
0.2500 mg | Freq: Once | INTRAVENOUS | Status: AC
  Administered 2020-06-02: 0.25 mg via INTRAVENOUS
  Filled 2020-06-02: qty 5

## 2020-06-02 MED ORDER — SODIUM CHLORIDE 0.9 % IV SOLN
150.0000 mg | Freq: Once | INTRAVENOUS | Status: AC
  Administered 2020-06-02: 150 mg via INTRAVENOUS
  Filled 2020-06-02: qty 150

## 2020-06-02 MED ORDER — SODIUM CHLORIDE 0.9 % IV SOLN
10.0000 mg | Freq: Once | INTRAVENOUS | Status: AC
  Administered 2020-06-02: 10 mg via INTRAVENOUS
  Filled 2020-06-02: qty 10

## 2020-06-02 NOTE — Progress Notes (Signed)
Pt in for follow up, states chemo was held last week.  Denies any concerns today.

## 2020-06-02 NOTE — Telephone Encounter (Signed)
Received request for increase in clonidine patch to 0.3mg /day patch from 0.2mg /day to control hot flashes. Per Dr. Gershon Crane last note, we need to contact PCP regarding dose increase due to her being on Lopressor. I have sent Dr. Tobie Poet a message and we will await her recommendation. Voicemail left with Ms. Grills.

## 2020-06-02 NOTE — Telephone Encounter (Signed)
Dr. Jacinto Reap - Patient is requesting a RF on her clonidine patch (previously prescribed by Dr. Theora Gianotti). Please consider RF.

## 2020-06-02 NOTE — Telephone Encounter (Signed)
She should have 3 refills already from original script I sent.

## 2020-06-02 NOTE — Assessment & Plan Note (Addendum)
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 Currently s/p neoadjuvant chemotherapy; debulking surgery on 4/14.   Clinically-improving; April 2021- 67[down from 8000 baseline].  #Proceed with carbotaxol-avastin today. Labs today reviewed;  acceptable for treatment today-platletes- 130   #Moderate thrombocytopenia platelets130- improved. -from carboplatin chemo.   #Discussed regarding starting maintenance therapy with Avastin-olaparib at next visit.  Discussed bringing her olaparib pills to revisit.  # right upper extremity DVT [ [jan 15th 2021]; currently xarelto 10 mg/day [porphylatic dose] x 5 more days.; and stop.    # DISPOSITION:  # chemo today; #Follow-up in 4 weeks-MD labs-CBC CMP CA 125; UA; Avastin;- Dr.B

## 2020-06-02 NOTE — Patient Instructions (Signed)
Bring the lynparza pills to your next visit
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
## 2020-06-03 LAB — CA 125: Cancer Antigen (CA) 125: 20.7 U/mL (ref 0.0–38.1)

## 2020-06-08 ENCOUNTER — Telehealth: Payer: Self-pay | Admitting: Pharmacist

## 2020-06-08 MED FILL — LYNPARZA 150 MG TABLET: 150 | 30 days supply | Qty: 120 | Fill #0

## 2020-06-08 NOTE — Telephone Encounter (Signed)
Oral Chemotherapy Pharmacist Encounter  Patient knows not to start until given the go ahead by Dr. Rogue Bussing.  Patient Education I spoke with patient for overview of new oral chemotherapy medication: Lynparza (olaparib) for maintenance therapy of peritoneal adenocarcinoma in conjunction with bevacizumab, planned duration until disease progression, unacceptable drug toxicity, or completion of 2 years.   Pt is doing well. Counseled patient on administration, dosing, side effects, monitoring, drug-food interactions, safe handling, storage, and disposal. Patient will take 2 tablets (300 mg total) by mouth 2 (two) times daily. Swallow whole. May take with food to decrease nausea and vomiting.  Side effects include but not limited to: N/V, fatigue, decreased wbc, diarrhea.    Patient stated that she still had some ondansetron on hand for N/V management.  Reviewed with patient importance of keeping a medication schedule and plan for any missed doses.  [name] voiced understanding and appreciation. All questions answered. Medication handout placed in the mail.  Provided patient with [facility] phone number. Patient knows to call the office with questions or concerns. Oral Chemotherapy [facility] will continue to follow.  Darl Pikes, PharmD, BCPS, BCOP, CPP Hematology/Oncology Clinical Pharmacist Practitioner [facility] [phone]  06/15/2020 10:44 AM

## 2020-06-09 ENCOUNTER — Ambulatory Visit: Payer: Medicaid Other | Admitting: Family Medicine

## 2020-06-09 ENCOUNTER — Encounter: Payer: Self-pay | Admitting: Family Medicine

## 2020-06-09 ENCOUNTER — Other Ambulatory Visit: Payer: Self-pay

## 2020-06-09 VITALS — BP 118/78 | HR 93 | Temp 98.1°F | Resp 18 | Ht 63.0 in | Wt 254.0 lb

## 2020-06-09 DIAGNOSIS — R61 Generalized hyperhidrosis: Secondary | ICD-10-CM

## 2020-06-09 DIAGNOSIS — C801 Malignant (primary) neoplasm, unspecified: Secondary | ICD-10-CM | POA: Diagnosis not present

## 2020-06-09 DIAGNOSIS — E8941 Symptomatic postprocedural ovarian failure: Secondary | ICD-10-CM

## 2020-06-09 DIAGNOSIS — M5136 Other intervertebral disc degeneration, lumbar region: Secondary | ICD-10-CM | POA: Diagnosis not present

## 2020-06-09 DIAGNOSIS — G620 Drug-induced polyneuropathy: Secondary | ICD-10-CM

## 2020-06-09 DIAGNOSIS — T451X5A Adverse effect of antineoplastic and immunosuppressive drugs, initial encounter: Secondary | ICD-10-CM

## 2020-06-09 DIAGNOSIS — F330 Major depressive disorder, recurrent, mild: Secondary | ICD-10-CM | POA: Diagnosis not present

## 2020-06-09 DIAGNOSIS — G43109 Migraine with aura, not intractable, without status migrainosus: Secondary | ICD-10-CM

## 2020-06-09 DIAGNOSIS — Z1501 Genetic susceptibility to malignant neoplasm of breast: Secondary | ICD-10-CM

## 2020-06-09 DIAGNOSIS — D696 Thrombocytopenia, unspecified: Secondary | ICD-10-CM | POA: Diagnosis not present

## 2020-06-09 DIAGNOSIS — R000 Tachycardia, unspecified: Secondary | ICD-10-CM | POA: Diagnosis not present

## 2020-06-09 DIAGNOSIS — C482 Malignant neoplasm of peritoneum, unspecified: Secondary | ICD-10-CM | POA: Diagnosis not present

## 2020-06-09 DIAGNOSIS — K219 Gastro-esophageal reflux disease without esophagitis: Secondary | ICD-10-CM | POA: Diagnosis not present

## 2020-06-09 DIAGNOSIS — Z1509 Genetic susceptibility to other malignant neoplasm: Secondary | ICD-10-CM

## 2020-06-09 MED ORDER — PREGABALIN 50 MG PO CAPS: 50.0000 mg | ORAL_CAPSULE | Freq: Three times a day (TID) | ORAL | 0 refills | Status: DC

## 2020-06-09 MED ORDER — METOPROLOL SUCCINATE ER 25 MG PO TB24: 12.5000 mg | ORAL_TABLET | Freq: Every day | ORAL | 1 refills | Status: DC

## 2020-06-09 MED ORDER — CLONIDINE HCL 0.1 MG PO TABS: 0.1000 mg | ORAL_TABLET | Freq: Every evening | ORAL | 1 refills | Status: DC

## 2020-06-09 MED ORDER — OMEPRAZOLE 20 MG PO CPDR: 20.0000 mg | DELAYED_RELEASE_CAPSULE | Freq: Every day | ORAL | 1 refills | Status: DC

## 2020-06-09 MED ORDER — DULOXETINE HCL 60 MG PO CPEP: 60.0000 mg | ORAL_CAPSULE | Freq: Every day | ORAL | 1 refills | Status: DC

## 2020-06-09 NOTE — Progress Notes (Signed)
Name: [name]   MRN: [id number]    DOB: June 23, 1971   Date:06/09/2020       Progress Note  Subjective  Chief Complaint  Chief Complaint  Patient presents with  . Medication Management  . Pain    hands and feet/numbness and tingling  . Night Sweats    HPI  HTN/Tachycardia: currently on metoprolol 12.5 mg but down to one daily, we will change to metoprolol ER 25 mg , heart rate still a little elevated, very seldom has palpitation   Morbid obesity: she lost weight during chemo, but she has gained it back, discussed healthy diet, appetite is much better.   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 Alto Denver. She is having some spotting, night sweats are severe, on Clonidine 0.2 mg , no side effects, but bp towards low end of normal, we will add clonidine 0.1 mg qhs and monitor for now. 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 Unable to play with grandkids because she has no energy secondary to chemo. She is home all day - on long term disability - she feels lonely and too much time on her hands. Realizing that only has a hand full of friends.    Patient Active Problem List   Diagnosis Date Noted  . 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 Arm  vein blood clot, right 03/11/2020  . Malignant ascites 12/22/2019  . Acute dyspnea 12/22/2019  . Pleural effusion on right 12/22/2019  . Tachycardia 12/22/2019  . Primary peritoneal carcinomatosis ([place]) 12/16/2019  . Goals of care, counseling/discussion 12/16/2019  . Serous adenocarcinoma ([place]) 12/11/2019  . Erythrocytosis 11/12/2019  . Morbid obesity with BMI of 40.0-44.9, adult ([place]) 07/07/2018  . 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 Fever blister 05/17/2018  . Family history of breast cancer 05/08/2018  . Family history of colon cancer in mother 05/08/2018  . Migraine with aura and without status migrainosus 04/18/2018  . Dysmenorrhea 06/21/2017  . Menorrhagia 06/21/2017  . Mild recurrent major depression ([place]) 01/20/2016  . Esophageal reflux 01/20/2016  . Osteoarthritis, multiple sites 01/20/2016  . Frequent headaches 01/20/2016    Past Surgical History:  Procedure Laterality Date  . ABDOMINAL HYSTERECTOMY  03/2020  . APPENDECTOMY     LSC but "ruptured when they did the surgery"  . CESAREAN SECTION    . CYSTOSCOPY N/A 04/08/2020   Procedure: CYSTOSCOPY;  [name], MD;  [name];  Service: Gynecology;  [name];  . IR THORACENTESIS ASP PLEURAL SPACE W/IMG GUIDE  12/06/2019  . IUD REMOVAL N/A 04/08/2020   Procedure: INTRAUTERINE DEVICE (IUD) REMOVAL;  [name], MD;  [name];  Service: Gynecology;  [name];  . PARACENTESIS  x6  . PORTA CATH INSERTION N/A 04/23/2020   Procedure: PORTA CATH INSERTION;  Surgeon: Annice Needy, MD;  Location: [facility] INVASIVE CV LAB;  Service: Cardiovascular;  [name];  . TUBAL LIGATION     at time of CSxn  . WRIST SURGERY Left 11/21/2016   plates and screws inserted    Family History  Adopted: Yes  Problem Relation Age of Onset  . Lung cancer Father        deceased 47  . Breast cancer Mother 86       currently 57  . Colon cancer Mother   . ADD / ADHD Son    . ADD / ADHD Son   . Early death Maternal Aunt   . Breast cancer Maternal Aunt 34       deceased 35  . Breast cancer Maternal Grandmother   . Depression Daughter   . Depression Daughter   . Prostate cancer Paternal Uncle   . Stroke Paternal Uncle   . Leukemia Paternal Aunt   . Breast cancer Paternal Grandmother     Social History   Tobacco Use  . Smoking status: Current Every Day Smoker    Packs/day: 0.50    Years: 30.00    Pack years: 15.00    Types: Cigarettes  . Smokeless tobacco: Never Used  Substance Use Topics  . Alcohol use: Not Currently    Alcohol/week: 0.0 standard drinks     Current Outpatient Medications:  .  ALPRAZolam (XANAX) 0.25 MG tablet, Take 1 tablet (0.25 mg total) by mouth 2 (two) times daily as needed for anxiety. Please do not drive while on the medication- as this can cause dizziness., Disp: 30 tablet, Rfl: 0 .  B Complex-C (B-COMPLEX WITH VITAMIN C) tablet, Take 1 tablet by mouth daily., Disp: , Rfl:  .  Biotin w/ Vitamins C & E (HAIR/SKIN/NAILS PO), Take 1 tablet by mouth daily., Disp: , Rfl:  .  cloNIDine (CATAPRES-TTS-2) 0.2 mg/24hr patch, Place 1 patch (0.2 mg total) onto the skin once a week., Disp: 4 patch, Rfl: 3 .  DULoxetine (CYMBALTA) 60 MG capsule, Take 1 capsule (60 mg total) by mouth daily. (Patient taking differently: Take 60 mg by mouth at bedtime. ), Disp: 90 capsule, Rfl: 0 .  lidocaine-prilocaine (EMLA) cream, Apply 1 application topically as needed., Disp: 30 g, Rfl: 3 .  loratadine (CLARITIN) 10 MG tablet, Take 1 tablet (10 mg total) by mouth daily. (Patient taking differently: Take 10 mg by mouth every morning. ), Disp: 90 tablet, Rfl: 1 .  metoprolol tartrate (LOPRESSOR) 25 MG tablet, Take 0.5 tablets (12.5 mg total) by mouth 2 (two) times daily. (Patient taking differently: Take 12.5 mg by mouth daily. ), Disp: 30 tablet, Rfl: 5 .  Multiple Vitamin (MULTIVITAMIN WITH MINERALS) TABS tablet, Take 1 tablet by mouth daily., Disp: ,  Rfl:  .  nicotine (NICODERM CQ - DOSED IN MG/24 HOURS) 14 mg/24hr patch, Place 1 patch (14 mg total) onto the skin daily., Disp: 28 patch, Rfl: 0 .  olaparib (LYNPARZA) 150 MG tablet, Take 2 tablets (300 mg total) by mouth 2 (two) times daily. Swallow whole. May take with food to decrease nausea and vomiting. DO NOT START UNTIL EVALUATED BY MD., Disp: 120 tablet, Rfl: 3 .  omeprazole (PRILOSEC) 20 MG capsule, Take 1 capsule (20 mg total) by mouth daily. (Patient taking differently: Take 20 mg by mouth at bedtime. ), Disp: 90 capsule, Rfl: 0 .  ondansetron (ZOFRAN) 8 MG  tablet, One pill every 8 hours as needed for nausea/vomitting., Disp: 40 tablet, Rfl: 1 .  oxyCODONE (OXY IR/ROXICODONE) 5 MG immediate release tablet, Take 1-2 tablets (5-10 mg total) by mouth every 8 (eight) hours as needed for severe pain., Disp: 60 tablet, Rfl: 0 .  prochlorperazine (COMPAZINE) 10 MG tablet, Take 1 tablet (10 mg total) by mouth every 6 (six) hours as needed for nausea or vomiting., Disp: 40 tablet, Rfl: 1 .  rivaroxaban (XARELTO) 10 MG TABS tablet, Take 1 tablet (10 mg total) by mouth daily with supper. START AFTER FINISHING THE CURRENT XARELTO prescription., Disp: 60 tablet, Rfl: 1 .  sennosides-docusate sodium (SENOKOT-S) 8.6-50 MG tablet, Take 1 tablet by mouth at bedtime., Disp: , Rfl:  .  SUMAtriptan (IMITREX) 100 MG tablet, Take 1 tablet (100 mg total) by mouth every 2 (two) hours as needed for migraine. May repeat in 2 hours if headache persists or recurs., Disp: 10 tablet, Rfl: 0 .  topiramate (TOPAMAX) 50 MG tablet, Take 2 tablets (100 mg total) by mouth 2 (two) times daily., Disp: 180 tablet, Rfl: 1 .  traZODone (DESYREL) 50 MG tablet, Take 0.5-1 tablets (25-50 mg total) by mouth at bedtime as needed for sleep., Disp: 30 tablet, Rfl: 2 .  valACYclovir (VALTREX) 1000 MG tablet, Take 1 tablet (1,000 mg total) by mouth 2 (two) times daily as needed (outbreak)., Disp: 6 tablet, Rfl: 0 No current  facility-administered medications for this visit.  Facility-Administered Medications Ordered in Other Visits:  .  sodium chloride flush (NS) 0.9 % injection 10 mL, 10 mL, Intravenous, Once, Donneta Romberg, Worthy Flank, MD  No Known Allergies  I personally reviewed active problem list, medication list, allergies, family history, social history, health maintenance with the patient/caregiver today.   ROS  Constitutional: Negative for fever , positive for  weight change.  Respiratory: Negative for cough and shortness of breath.   Cardiovascular: Negative for chest pain or palpitations.  Gastrointestinal: Negative for abdominal pain, no bowel changes.  Musculoskeletal: Negative for gait problem or joint swelling.  Skin: Negative for rash.  Neurological: Negative for dizziness , positive for intermittent  headache.  No other specific complaints in a complete review of systems (except as listed in HPI above).  Objective  Vitals:   06/09/20 1040  BP: 118/78  Pulse: 93  Resp: 18  Temp: 98.1 F (36.7 C)  TempSrc: Temporal  SpO2: 93%  Weight: 254 lb (115.2 kg)  Height: 5\' 3"  (1.6 m)    Body mass index is 44.99 kg/m.  Physical Exam  Constitutional: Patient appears well-developed and well-nourished. Obese  No distress.  HEENT: head atraumatic, normocephalic, pupils equal and reactive to light,  neck supple Cardiovascular: Normal rate, regular rhythm and normal heart sounds.  No murmur heard. No BLE edema. Pulmonary/Chest: Effort normal and breath sounds normal. No respiratory distress. Abdominal: Soft.  There is no tenderness. Skin: alopecia due to chemo  Psychiatric: Patient has a normal mood and affect. behavior is normal. Judgment and thought content normal.  Recent Results (from the past 2160 hour(s))  CA 125     Status: Abnormal   Collection Time: 03/30/20  9:41 AM  Result Value Ref Range   Cancer Antigen (CA) 125 129.0 (H) 0.0 - 38.1 U/mL    Comment: (NOTE) [employer]  Electrochemiluminescence Immunoassay (ECLIA) Values obtained with different assay methods or kits cannot be used interchangeably.  Results cannot be interpreted as absolute evidence of the presence or absence of malignant disease. Performed At:  [facility] [address] [place], [place] [id number] Jolene Schimke MD YS:[id number]   Comprehensive metabolic panel     Status: Abnormal   Collection Time: 03/30/20  9:41 AM  Result Value Ref Range   Sodium 138 135 - 145 mmol/L   Potassium 4.1 3.5 - 5.1 mmol/L   Chloride 105 98 - 111 mmol/L   CO2 24 22 - 32 mmol/L   Glucose, Bld 107 (H) 70 - 99 mg/dL    Comment: Glucose reference range applies only to samples taken after fasting for at least 8 hours.   BUN 20 6 - 20 mg/dL   Creatinine, Ser 6.31 0.44 - 1.00 mg/dL   Calcium 9.4 8.9 - 86.6 mg/dL   Total Protein 8.0 6.5 - 8.1 g/dL   Albumin 3.9 3.5 - 5.0 g/dL   AST 19 15 - 41 U/L   ALT 23 0 - 44 U/L   Alkaline Phosphatase 61 38 - 126 U/L   Total Bilirubin 0.4 0.3 - 1.2 mg/dL   GFR calc non Af Amer >60 >60 mL/min   GFR calc Af Amer >60 >60 mL/min   Anion gap 9 5 - 15    Comment: Performed at [facility], [address] [place], [place] [address]  CBC with Differential     Status: Abnormal   Collection Time: 03/30/20  9:41 AM  Result Value Ref Range   WBC 4.5 4.0 - 10.5 K/uL   RBC 3.19 (L) 3.87 - 5.11 MIL/uL   Hemoglobin 11.4 (L) 12.0 - 15.0 g/dL   HCT 55.1 (L) 36 - 46 %   MCV 105.0 (H) 80.0 - 100.0 fL   MCH 35.7 (H) 26.0 - 34.0 pg   MCHC 34.0 30.0 - 36.0 g/dL   RDW 42.9 (H) 57.0 - 81.6 %   Platelets 176 150 - 400 K/uL   nRBC 0.0 0.0 - 0.2 %   Neutrophils Relative % 62 %   Neutro Abs 2.7 1.7 - 7.7 K/uL   Lymphocytes Relative 23 %   Lymphs Abs 1.0 0.7 - 4.0 K/uL   Monocytes Relative 14 %   Monocytes Absolute 0.6 0 - 1 K/uL   Eosinophils Relative 0 %   Eosinophils Absolute 0.0 0 - 0 K/uL   Basophils Relative 1 %   Basophils Absolute 0.0 0 - 0 K/uL   Immature  Granulocytes 0 %   Abs Immature Granulocytes 0.02 0.00 - 0.07 K/uL    Comment: Performed at [facility], [address] [place], [place] [address]  APTT     Status: None   Collection Time: 04/06/20  9:50 AM  Result Value Ref Range   aPTT 27 24 - 36 seconds    Comment: Performed at [facility], [address] [place], [place] [address]  Protime-INR     Status: None   Collection Time: 04/06/20  9:50 AM  Result Value Ref Range   Prothrombin Time 12.7 11.4 - 15.2 seconds   INR 1.0 0.8 - 1.2    Comment: (NOTE) INR goal varies based on device and disease states. Performed at [facility], [address] [place], [place] [address]   Type and screen     Status: None   Collection Time: 04/06/20  9:50 AM  Result Value Ref Range   ABO/RH(D) B POS    Antibody Screen NEG    Sample Expiration 04/20/2020,2359    Extend sample reason      NO TRANSFUSIONS OR PREGNANCY IN  THE PAST 3 MONTHS Performed at [facility], [place], [place] [address]   SARS CORONAVIRUS 2 (TAT 6-24 HRS) Nasopharyngeal Nasopharyngeal Swab     Status: None   Collection Time: 04/06/20 12:04 PM   Specimen: Nasopharyngeal Swab  Result Value Ref Range   SARS Coronavirus 2 NEGATIVE NEGATIVE    Comment: (NOTE) SARS-CoV-2 target nucleic acids are NOT DETECTED. The SARS-CoV-2 RNA is generally detectable in upper and lower respiratory specimens during the acute phase of infection. Negative results do not preclude SARS-CoV-2 infection, do not rule out co-infections with other pathogens, and should not be used as the sole basis for treatment or other patient management decisions. Negative results must be combined with clinical observations, patient history, and epidemiological information. The expected result is Negative. Fact Sheet for Patients: SugarRoll.be Fact Sheet for Healthcare  Providers: https://www.woods-mathews.com/ This test is not yet approved or cleared by the [place] FDA and  has been authorized for detection and/or diagnosis of SARS-CoV-2 by FDA under an Emergency Use Authorization (EUA). This EUA will remain  in effect (meaning this test can be used) for the duration of the COVID-19 declaration under Section 56 4(b)(1) of the Act, 21 U.S.C. section 360bbb-3(b)(1), unless the authorization is terminated or revoked sooner. Performed at [facility], [place] [address], [place], [place] [address]   Pregnancy, urine POC     Status: None   Collection Time: 04/08/20  6:21 AM  Result Value Ref Range   Preg Test, Ur NEGATIVE NEGATIVE    Comment:        THE SENSITIVITY OF THIS METHODOLOGY IS >24 mIU/mL   ABO/Rh     Status: None   Collection Time: 04/08/20  6:29 AM  Result Value Ref Range   ABO/RH(D)      B POS Performed at [facility], [address], [place], [place] [address]   Surgical pathology     Status: None   Collection Time: 04/08/20  9:45 AM  Result Value Ref Range   SURGICAL PATHOLOGY      SURGICAL PATHOLOGY CASE: [id number] PATIENT: Haynes Hoehn Surgical Pathology Report     Specimen Submitted: A. Uterine scar B. Gutter excision, left paracolic C. Rectum; bx D. Peritoneum, left pelvic E. Omentum F. Uterus, cervix, bil ovaries and tubes G. Gutter excision, right paracolic  Clinical History: Serous adenocarcinoma      DIAGNOSIS: A. UTERINE SCAR; EXCISION: - BENIGN SMOOTH MUSCLE AND FIBROUS TISSUE. - NEGATIVE FOR MALIGNANCY.  B. GUTTER, LEFT PARACOLIC; EXCISION: - HIGH-GRADE CARCINOMA WITH TREATMENT EFFECT; SEE COMMENT.  C. RECTUM; BIOPSY: - HIGH-GRADE CARCINOMA WITH TREATMENT EFFECT; SEE COMMENT.  D. PERITONEUM, LEFT PELVIC; EXCISION: - HIGH-GRADE CARCINOMA WITH TREATMENT EFFECT; SEE COMMENT.  E. OMENTUM, EXCISION; - HIGH-GRADE CARCINOMA WITH TREATMENT EFFECT; SEE  COMMENT.  F. UTERUS AND CERVIX WITH BILATERAL OVARIES AND FALLOPIAN TUBES; TOTAL HYSTERECTOMY WITH BILATERAL SALPINGO-OOPHORECTOMY: - BILATERAL OVARIES: HIGH-GRADE  CARCINOMA WITH TREATMENT EFFECT. - BILATERAL FALLOPIAN TUBES: HIGH-GRADE CARCINOMA WITH TREATMENT EFFECT. - UTERUS: SEROSA INVOLVED BY HIGH-GRADE CARCINOMA WITH TREATMENT EFFECT.  G. GUTTER, RIGHT PARACOLIC; EXCISION: - HIGH-GRADE CARCINOMA WITH TREATMENT EFFECT; SEE COMMENT.  Comment: The case was sent to [facility] for expert consultation and was read by Dr. Delmer Islam, who provided the following comment:  "Thank you for sending this challenging case for diagnostic consultation.  Sections show a poorly differentiated adenocarcinoma characterized by marked nuclear pleomorphism and tumor cells with clear cytoplasm.  The following immunohistochemistry was performed after  review of the clinical history and morphology to further characterize the pathologic process.  The results are as follows:  CD10: Focally positive in an area of endometriosis P16: Diffuse, strong positive P53: Minimal staining - possibly p53 null immunophenotype WT-1: Patchy positive Napsin  A: Negative HNF1-B: Negative P504S: Negative HCG (Beta): Negative  Although the morphology is suggestive of clear cell carcinoma, the immunophenotype is more consistent with high grade serous carcinoma. Classification on this post-treatment tumor is challenging.  It may be helpful to corelate with the pre-treatment tumor classification.  P53 sequencing may be helpful, if clinically indicated.  Dr. Lattie Corns has reviewed this case and concurs with the above diagnosis."    GROSS DESCRIPTION: A. Labeled: Uterine scar Received: Formalin Tissue fragment(s): 1 Size: 1.8 x 1.0 x 0.6 cm Description: Received is an irregular fragment of tan soft tissue, sectioned. Entirely submitted in cassette 1.  B. Labeled: Left paracolic gutter excision Received:  Formalin Tissue fragment(s): 2 Size: Aggregate, 3.7 x 2.2 x 0.7 cm Description: Received are irregular fragments of tan-yellow soft tissue with multifocal areas of pale-tan discoloration.  Serially sectioned. Entirel y submitted in cassettes 1-2.  C. Labeled: Rectal biopsy Received: Formalin Tissue fragment(s): 1 Size: 1.3 x 0.8 x 0.3 cm Description: Received is an irregular fragment of tan-yellow soft tissue with areas of pale-tan discoloration.  Trisected.  Entirely submitted in cassette 1.  D. Labeled: Left pelvic peritoneum Received: Formalin Tissue fragment(s): 2 Size: Aggregate, 3.1 x 3.0 x 0.5 cm Description: Received are irregular fragments of tan-yellow soft tissue with areas of pale-tan discoloration.    Serially sectioned. Entirely submitted in cassettes 1-3.  E. Labeled: Omentum Received: Formalin Tissue fragment(s): 1 Size: 19.0 x 16.5 x 1.7 cm Description: Received is an irregular fragment of tan-yellow, lobulated adipose tissue with areas of tan-white discoloration.  Representative sections are submitted in cassettes 1-5.  F. Labeled: Uterus with cervix, bilateral ovaries, bilateral fallopian tubes Received: Formalin Weight: 118 grams (total) Dimensions:       Fundus - 6.5 x 4.8 x 3.8 cm      Cervix - 3.2 x 3.1 x 2.7 cm Serosa: The anterior uterine serosa is received disrupted, with associated cautery artifact.  The posterior uterine serosa adjacent to the left ovary displays a 1.1 x 1.0 x 0.4 cm irregular nodule which extends to the periovarian soft tissue.  The anterior uterine serosa and cervical soft tissue resection margin is inked blue, and the posterior uterine serosa and cervical soft tissue resection margin is inked black. Cervix: The ectocervical mucosa is tan-brown and smooth.  The external os is 0.7 cm in diameter, patent, and slit-like shaped. Endocervix: 2.8 cm in length by 0.9 cm in diameter.  The endocervical mucosa is tan and  striated. Endometrial cavity:      Dimensions - 4.8 cm in length by 2.0 cm cornu to cornu width      Thickness - 0.1 cm      Other findings - The endometrium is unremarkable. Myometrium:     Thickness - 1.5 cm (average)     Other findings -  No nodules or abnormalities are gross ly identified. Adnexa:      Right ovary           Weight - 6 grams           Measurement - 2.6 x 2.0 x 1.2 cm           Serosa - Slightly granular, otherwise pale-tan and smooth.  Cut surface - Sectioning displays a 1.7 x 1.5 x 0.5 cm area of pale-tan discoloration.      Right fallopian tube           Measurements - 4.3 cm in length x 0.5 cm in diameter           Other findings - Fimbria are present.  The external surface displays areas of pale-tan discoloration.  There is a 0.9 cm paratubal smooth walled cyst containing clear fluid.  Sectioning displays a patent pinpoint lumen.      Left ovary            Weight - 5 grams           Measurement - 2.7 x 2.1 x 1.0 cm           Serosa - The serosa is pale-tan, smooth, and cerebriform.           Cut surface - Sectioning displays a 0.7 cm in greatest dimension smooth-walled cyst containing hemorrhagic material.  The remaining ovarian parenchyma is tan and otherwise grossly unremarkable. Sectioning the periovari an soft tissue displays multifocal areas of possible pale-tan discoloration ranging from 0.4 to 1.0 cm in greatest dimension.  The remaining cut surface is vascularized and otherwise grossly unremarkable.      Left fallopian tube            Measurements - 4.8 cm in length x 0.5 cm in diameter           Other findings - Fimbria are present of which are tan-purple and grossly unremarkable.  The external surface displays multiple paratubal, smooth walled cysts containing clear fluid and ranging from 0.1 to 0.3 cm in greatest dimension.  The remaining external surface is tan-purple and smooth.  Sectioning displays a pinpoint, grossly unremarkable  lumen.  Block summary: 1 - anterior cervix (slightly disrupted) 2 - anterior lower uterine segment 3 - posterior cervix 4 - posterior lower uterine segment 5 - entire, irregular nodule involving posterior uterine serosa and left, periovarian soft tissue 6 - anterior endomyometrium 7 - posterior endomyometrium 8-13 - entire, r ight ovary (ill-defined area of pale-tan discoloration in cassettes 6-12) 14-16 - entire, right periovarian soft tissue 17-20 - entire right fallopian tube submitted according to SEE FIM protocol (longitudinally sectioned fimbria in cassettes 17-18; cross-sections submitted from distal to proximal in cassettes 19-20) 21-24 - entire, left ovary (hemorrhagic cyst in cassettes 21-22) 25-31 - entire, left periovarian soft tissue (multifocal areas of possible pale-tan discoloration in cassettes 25-28) 32-35 - entire left fallopian tube submitted according to SEE FIM protocol (longitudinally sectioned fimbria in cassettes 32-33; cross-sections submitted from distal to proximal in cassettes 34-35)  G. Labeled: Right paracolic gutter excision Received: Formalin Tissue fragment(s): 1 Size: 3.9 x 1.6 x 0.8 cm Description: Received is an irregular fragment of tan-yellow soft tissue.  Sectioning displays multifocal, irregular areas of pale-tan discoloration.  No additional abnor malities are grossly identified. Serially sectioned. Entirely submitted in cassettes 1-3.   Final Diagnosis performed by Betsy Pries, MD.   Electronically signed 04/29/2020 1:55:24PM The electronic signature indicates that the named Attending Pathologist has evaluated the specimen Technical component performed at [facility], [address] [place], [place] [address] Lab: [id number] Dir: Rush Farmer, MD, MMM  Professional component performed at [facility], [facility], [place], [place], [place] [address] Lab: [id number] Dir: Dellia Nims. Reuel Derby, MD   CBC     Status:  Abnormal   Collection Time: 04/08/20  4:43 PM  Result Value Ref Range   WBC 13.0 (H) 4.0 - 10.5 K/uL   RBC 3.52 (L) 3.87 - 5.11 MIL/uL   Hemoglobin 12.6 12.0 - 15.0 g/dL   HCT 37.5 36 - 46 %   MCV 106.5 (H) 80.0 - 100.0 fL   MCH 35.8 (H) 26.0 - 34.0 pg   MCHC 33.6 30.0 - 36.0 g/dL   RDW 16.9 (H) 11.5 - 15.5 %   Platelets 235 150 - 400 K/uL   nRBC 0.0 0.0 - 0.2 %    Comment: Performed at [facility], [address] [place], [place] [address]  Creatinine, serum     Status: None   Collection Time: 04/08/20  4:43 PM  Result Value Ref Range   Creatinine, Ser 0.72 0.44 - 1.00 mg/dL   GFR calc non Af Amer >60 >60 mL/min   GFR calc Af Amer >60 >60 mL/min    Comment: Performed at [facility], [address] [place], [place] [address]  CBC     Status: Abnormal   Collection Time: 04/09/20 12:04 AM  Result Value Ref Range   WBC 11.0 (H) 4.0 - 10.5 K/uL   RBC 3.29 (L) 3.87 - 5.11 MIL/uL   Hemoglobin 11.8 (L) 12.0 - 15.0 g/dL   HCT 34.6 (L) 36 - 46 %   MCV 105.2 (H) 80.0 - 100.0 fL   MCH 35.9 (H) 26.0 - 34.0 pg   MCHC 34.1 30.0 - 36.0 g/dL   RDW 16.9 (H) 11.5 - 15.5 %   Platelets 232 150 - 400 K/uL   nRBC 0.0 0.0 - 0.2 %    Comment: Performed at [facility], [address] [place], [place] [address]  Basic metabolic panel     Status: Abnormal   Collection Time: 04/09/20 12:04 AM  Result Value Ref Range   Sodium 136 135 - 145 mmol/L   Potassium 4.3 3.5 - 5.1 mmol/L   Chloride 102 98 - 111 mmol/L   CO2 24 22 - 32 mmol/L   Glucose, Bld 133 (H) 70 - 99 mg/dL    Comment: Glucose reference range applies only to samples taken after fasting for at least 8 hours.   BUN 10 6 - 20 mg/dL   Creatinine, Ser 0.72 0.44 - 1.00 mg/dL   Calcium 8.7 (L) 8.9 - 10.3 mg/dL   GFR calc non Af Amer >60 >60 mL/min   GFR calc Af Amer >60 >60 mL/min   Anion gap 10 5 - 15    Comment: Performed at [facility], [address] [place], [place] [address]  Basic  metabolic panel     Status: Abnormal   Collection Time: 04/14/20  8:23 AM  Result Value Ref Range   Sodium 137 135 - 145 mmol/L   Potassium 3.8 3.5 - 5.1 mmol/L   Chloride 101 98 - 111 mmol/L   CO2 25 22 - 32 mmol/L   Glucose, Bld 117 (H) 70 - 99 mg/dL    Comment: Glucose reference range applies only to samples taken after fasting for at least 8 hours.   BUN 9 6 - 20 mg/dL   Creatinine, Ser 0.66 0.44 - 1.00 mg/dL   Calcium 9.0 8.9 - 10.3 mg/dL   GFR calc non Af Amer >60 >60 mL/min   GFR calc Af Amer >60 >60 mL/min   Anion gap 11 5 - 15    Comment: Performed at [facility], [place],  [place] [address]  CBC with Differential     Status: Abnormal   Collection Time: 04/14/20  8:23 AM  Result Value Ref Range   WBC 5.0 4.0 - 10.5 K/uL   RBC 3.37 (L) 3.87 - 5.11 MIL/uL   Hemoglobin 11.8 (L) 12.0 - 15.0 g/dL   HCT 36.1 36 - 46 %   MCV 107.1 (H) 80.0 - 100.0 fL   MCH 35.0 (H) 26.0 - 34.0 pg   MCHC 32.7 30.0 - 36.0 g/dL   RDW 15.4 11.5 - 15.5 %   Platelets 214 150 - 400 K/uL   nRBC 0.0 0.0 - 0.2 %   Neutrophils Relative % 61 %   Neutro Abs 3.1 1.7 - 7.7 K/uL   Lymphocytes Relative 25 %   Lymphs Abs 1.2 0.7 - 4.0 K/uL   Monocytes Relative 10 %   Monocytes Absolute 0.5 0 - 1 K/uL   Eosinophils Relative 2 %   Eosinophils Absolute 0.1 0 - 0 K/uL   Basophils Relative 1 %   Basophils Absolute 0.0 0 - 0 K/uL   Immature Granulocytes 1 %   Abs Immature Granulocytes 0.04 0.00 - 0.07 K/uL    Comment: Performed at [facility], [address] [place], [place] [address]  CA 125     Status: Abnormal   Collection Time: 04/14/20  8:23 AM  Result Value Ref Range   Cancer Antigen (CA) 125 69.7 (H) 0.0 - 38.1 U/mL    Comment: (NOTE) Roche Diagnostics Electrochemiluminescence Immunoassay (ECLIA) Values obtained with different assay methods or kits cannot be used interchangeably.  Results cannot be interpreted as absolute evidence of the presence or absence of  malignant disease. Performed At: [facility] [place], [place] [id number] Rush Farmer MD [id number]   SARS CORONAVIRUS 2 (TAT 6-24 HRS) Nasopharyngeal Nasopharyngeal Swab     Status: None   Collection Time: 04/21/20 12:02 PM   Specimen: Nasopharyngeal Swab  Result Value Ref Range   SARS Coronavirus 2 NEGATIVE NEGATIVE    Comment: (NOTE) SARS-CoV-2 target nucleic acids are NOT DETECTED. The SARS-CoV-2 RNA is generally detectable in upper and lower respiratory specimens during the acute phase of infection. Negative results do not preclude SARS-CoV-2 infection, do not rule out co-infections with other pathogens, and should not be used as the sole basis for treatment or other patient management decisions. Negative results must be combined with clinical observations, patient history, and epidemiological information. The expected result is Negative. Fact Sheet for Patients: SugarRoll.be Fact Sheet for Healthcare Providers: https://www.woods-mathews.com/ This test is not yet approved or cleared by the [place] FDA and  has been authorized for detection and/or diagnosis of SARS-CoV-2 by FDA under an Emergency Use Authorization (EUA). This EUA will remain  in effect (meaning this test can be used) for the duration of the COVID-19 declaration under Section 56 4(b)(1) of the Act, 21 U.S.C. section 360bbb-3(b)(1), unless the authorization is terminated or revoked sooner. Performed at [name], [place] [address], [place], [place] [address]   CBC with Differential     Status: Abnormal   Collection Time: 05/04/20  8:37 AM  Result Value Ref Range   WBC 4.7 4.0 - 10.5 K/uL   RBC 3.52 (L) 3.87 - 5.11 MIL/uL   Hemoglobin 11.9 (L) 12.0 - 15.0 g/dL   HCT 36.4 36 - 46 %   MCV 103.4 (H) 80.0 - 100.0 fL   MCH 33.8 26.0 - 34.0 pg   MCHC 32.7 30.0 -  36.0 g/dL   RDW 14.5 11.5 - 15.5 %   Platelets 178 150 - 400 K/uL    nRBC 0.0 0.0 - 0.2 %   Neutrophils Relative % 32 %   Neutro Abs 1.5 (L) 1.7 - 7.7 K/uL   Lymphocytes Relative 43 %   Lymphs Abs 2.0 0.7 - 4.0 K/uL   Monocytes Relative 5 %   Monocytes Absolute 0.2 0 - 1 K/uL   Eosinophils Relative 16 %   Eosinophils Absolute 0.8 (H) 0 - 0 K/uL   Basophils Relative 4 %   Basophils Absolute 0.2 (H) 0 - 0 K/uL   WBC Morphology      DIFF CONFIRMED BY MANUAL. OCC LARGE GRANULAR LYPHOCYTES NOTED   RBC Morphology UNREMARKABLE    Smear Review PLATELETS APPEAR ADEQUATE     Comment: PLATELETS VARY IN SIZE   Abs Immature Granulocytes 0.00 0.00 - 0.07 K/uL    Comment: Performed at [facility], [address] [place], [place] [address]  Comprehensive metabolic panel     Status: Abnormal   Collection Time: 05/04/20  8:37 AM  Result Value Ref Range   Sodium 136 135 - 145 mmol/L   Potassium 3.9 3.5 - 5.1 mmol/L   Chloride 102 98 - 111 mmol/L   CO2 25 22 - 32 mmol/L   Glucose, Bld 110 (H) 70 - 99 mg/dL    Comment: Glucose reference range applies only to samples taken after fasting for at least 8 hours.   BUN 14 6 - 20 mg/dL   Creatinine, Ser 0.69 0.44 - 1.00 mg/dL   Calcium 8.8 (L) 8.9 - 10.3 mg/dL   Total Protein 7.7 6.5 - 8.1 g/dL   Albumin 3.7 3.5 - 5.0 g/dL   AST 18 15 - 41 U/L   ALT 14 0 - 44 U/L   Alkaline Phosphatase 64 38 - 126 U/L   Total Bilirubin 0.4 0.3 - 1.2 mg/dL   GFR calc non Af Amer >60 >60 mL/min   GFR calc Af Amer >60 >60 mL/min   Anion gap 9 5 - 15    Comment: Performed at [facility], [address] [place],  [address]  CA 125     Status: None   Collection Time: 05/04/20  8:37 AM  Result Value Ref Range   Cancer Antigen (CA) 125 34.2 0.0 - 38.1 U/mL    Comment: (NOTE) Roche [employer]ostics Electrochemiluminescence Immunoassay (ECLIA) Values obtained with different assay methods or kits cannot be used interchangeably.  Results cannot be interpreted as absolute evidence of the presence or absence of malignant  disease. Performed At: [facility] [place], [place] [id number] Rush Farmer MD [id number]   CA 125     Status: None   Collection Time: 05/26/20  8:25 AM  Result Value Ref Range   Cancer Antigen (CA) 125 24.6 0.0 - 38.1 U/mL    Comment: (NOTE) Roche [employer]ostics Electrochemiluminescence Immunoassay (ECLIA) Values obtained with different assay methods or kits cannot be used interchangeably.  Results cannot be interpreted as absolute evidence of the presence or absence of malignant disease. Performed At: [facility] [place], [place] [id number] Rush Farmer MD IO:[id number]   Comprehensive metabolic panel     Status: Abnormal   Collection Time: 05/26/20  8:25 AM  Result Value Ref Range   Sodium 135 135 - 145 mmol/L   Potassium 3.8 3.5 - 5.1 mmol/L   Chloride 102 98 - 111 mmol/L  CO2 25 22 - 32 mmol/L   Glucose, Bld 104 (H) 70 - 99 mg/dL    Comment: Glucose reference range applies only to samples taken after fasting for at least 8 hours.   BUN 10 6 - 20 mg/dL   Creatinine, Ser 0.66 0.44 - 1.00 mg/dL   Calcium 8.9 8.9 - 10.3 mg/dL   Total Protein 7.3 6.5 - 8.1 g/dL   Albumin 3.6 3.5 - 5.0 g/dL   AST 19 15 - 41 U/L   ALT 19 0 - 44 U/L   Alkaline Phosphatase 69 38 - 126 U/L   Total Bilirubin 0.5 0.3 - 1.2 mg/dL   GFR calc non Af Amer >60 >60 mL/min   GFR calc Af Amer >60 >60 mL/min   Anion gap 8 5 - 15    Comment: Performed at [facility], [address] [place], [place] [address]  CBC with Differential     Status: Abnormal   Collection Time: 05/26/20  8:25 AM  Result Value Ref Range   WBC 3.6 (L) 4.0 - 10.5 K/uL   RBC 3.68 (L) 3.87 - 5.11 MIL/uL   Hemoglobin 12.0 12.0 - 15.0 g/dL   HCT 36.5 36 - 46 %   MCV 99.2 80.0 - 100.0 fL   MCH 32.6 26.0 - 34.0 pg   MCHC 32.9 30.0 - 36.0 g/dL   RDW 14.8 11.5 - 15.5 %   Platelets 69 (L) 150 - 400 K/uL   nRBC 0.0 0.0 - 0.2 %   Neutrophils Relative % 43 %   Neutro Abs 1.6  (L) 1.7 - 7.7 K/uL   Lymphocytes Relative 40 %   Lymphs Abs 1.4 0.7 - 4.0 K/uL   Monocytes Relative 12 %   Monocytes Absolute 0.4 0 - 1 K/uL   Eosinophils Relative 3 %   Eosinophils Absolute 0.1 0 - 0 K/uL   Basophils Relative 1 %   Basophils Absolute 0.0 0 - 0 K/uL   Smear Review Normal platelet morphology     Comment: PLATELETS APPEAR DECREASED   Immature Granulocytes 1 %   Abs Immature Granulocytes 0.02 0.00 - 0.07 K/uL    Comment: Performed at [facility], [address] [place], [place] [address]  Urinalysis, Complete w Microscopic     Status: Abnormal   Collection Time: 06/02/20  8:34 AM  Result Value Ref Range   Color, Urine YELLOW (A) YELLOW   APPearance CLOUDY (A) CLEAR   Specific Gravity, Urine 1.024 1.005 - 1.030   pH 5.0 5.0 - 8.0   Glucose, UA NEGATIVE NEGATIVE mg/dL   Hgb urine dipstick MODERATE (A) NEGATIVE   Bilirubin Urine NEGATIVE NEGATIVE   Ketones, ur NEGATIVE NEGATIVE mg/dL   Protein, ur NEGATIVE NEGATIVE mg/dL   Nitrite NEGATIVE NEGATIVE   Leukocytes,Ua LARGE (A) NEGATIVE   RBC / HPF 11-20 0 - 5 RBC/hpf   WBC, UA >50 (H) 0 - 5 WBC/hpf   Bacteria, UA RARE (A) NONE SEEN   Squamous Epithelial / LPF 0-5 0 - 5   Mucus PRESENT     Comment: Performed at [facility], [address] [place], [place] [address]  CA 125     Status: None   Collection Time: 06/02/20  8:34 AM  Result Value Ref Range   Cancer Antigen (CA) 125 20.7 0.0 - 38.1 U/mL    Comment: (NOTE) Roche Diagnostics Electrochemiluminescence Immunoassay (ECLIA) Values obtained with different assay methods or kits cannot be used interchangeably.  Results  cannot be interpreted as absolute evidence of the presence or absence of malignant disease. Performed At: [facility] [place], [place] [id number] Rush Farmer MD [id number]   Comprehensive metabolic panel     Status: Abnormal   Collection Time: 06/02/20  8:34 AM  Result Value Ref Range   Sodium  138 135 - 145 mmol/L   Potassium 4.1 3.5 - 5.1 mmol/L   Chloride 103 98 - 111 mmol/L   CO2 25 22 - 32 mmol/L   Glucose, Bld 121 (H) 70 - 99 mg/dL    Comment: Glucose reference range applies only to samples taken after fasting for at least 8 hours.   BUN 14 6 - 20 mg/dL   Creatinine, Ser 0.67 0.44 - 1.00 mg/dL   Calcium 9.0 8.9 - 10.3 mg/dL   Total Protein 7.5 6.5 - 8.1 g/dL   Albumin 3.7 3.5 - 5.0 g/dL   AST 21 15 - 41 U/L   ALT 19 0 - 44 U/L   Alkaline Phosphatase 70 38 - 126 U/L   Total Bilirubin 0.4 0.3 - 1.2 mg/dL   GFR calc non Af Amer >60 >60 mL/min   GFR calc Af Amer >60 >60 mL/min   Anion gap 10 5 - 15    Comment: Performed at [facility], [address] [place], [place] [address]  CBC with Differential     Status: Abnormal   Collection Time: 06/02/20  8:34 AM  Result Value Ref Range   WBC 4.8 4.0 - 10.5 K/uL   RBC 3.61 (L) 3.87 - 5.11 MIL/uL   Hemoglobin 11.9 (L) 12.0 - 15.0 g/dL   HCT 36.0 36 - 46 %   MCV 99.7 80.0 - 100.0 fL   MCH 33.0 26.0 - 34.0 pg   MCHC 33.1 30.0 - 36.0 g/dL   RDW 15.6 (H) 11.5 - 15.5 %   Platelets 130 (L) 150 - 400 K/uL   nRBC 0.0 0.0 - 0.2 %   Neutrophils Relative % 49 %   Neutro Abs 2.4 1.7 - 7.7 K/uL   Lymphocytes Relative 30 %   Lymphs Abs 1.4 0.7 - 4.0 K/uL   Monocytes Relative 12 %   Monocytes Absolute 0.6 0 - 1 K/uL   Eosinophils Relative 8 %   Eosinophils Absolute 0.4 0 - 0 K/uL   Basophils Relative 1 %   Basophils Absolute 0.0 0 - 0 K/uL   WBC Morphology DIFF CONFIRMED BY MANUAL.    RBC Morphology MORPHOLOGY UNREMARKABLE    Smear Review MORPHOLOGY UNREMARKABLE     Comment: Normal platelet morphology Reviewed    Abs Immature Granulocytes 0.00 0.00 - 0.07 K/uL    Comment: Performed at [facility], [address] [place], [place] [address]     PHQ2/9: Depression screen [facility] 2/9 06/09/2020 03/24/2020 12/10/2019 10/25/2019 05/17/2018  Decreased Interest 0 0 1 0 1  Down, Depressed, Hopeless 0 0 2 0 1  PHQ - 2  Score 0 0 3 0 2  Altered sleeping 1 0 1 3 0  Tired, decreased energy 0 0 '1 1 1  '$ Change in appetite 0 0 1 0 0  Feeling bad or failure about yourself  0 0 0 1 0  Trouble concentrating 0 0 1 0 1  Moving slowly or fidgety/restless 0 0 0 0 0  Suicidal thoughts 0 0 0 0 0  PHQ-9 Score 1 0 '7 5 4  '$ Difficult doing work/chores Not difficult at all - Somewhat  difficult Somewhat difficult Somewhat difficult  Some recent data might be hidden    phq 9 is negative   Fall Risk: Fall Risk  06/09/2020 03/24/2020 12/10/2019 10/25/2019 05/17/2018  Falls in the past year? 0 0 0 0 No  Number falls in past yr: - 0 0 0 -  Injury with Fall? - 0 0 0 -     Functional Status Survey: Is the patient deaf or have difficulty hearing?: No Does the patient have difficulty seeing, even when wearing glasses/contacts?: No Does the patient have difficulty concentrating, remembering, or making decisions?: No Does the patient have difficulty walking or climbing stairs?: No Does the patient have difficulty dressing or bathing?: No Does the patient have difficulty doing errands alone such as visiting a doctor's office or shopping?: No   Assessment & Plan  1. Night sweats  - cloNIDine (CATAPRES) 0.1 MG tablet; Take 1 tablet (0.1 mg total) by mouth at bedtime.  Dispense: 90 tablet; Refill: 1  2. Surgical menopause, symptomatic   3. Morbid obesity ([place])  Discussed with the patient the risk posed by an increased BMI. Discussed importance of portion control, calorie counting and at least 150 minutes of physical activity weekly. Avoid sweet beverages and drink more water. Eat at least 6 servings of fruit and vegetables daily   4. Cancer ([place])   5. 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 Mild episode of recurrent major depressive disorder (HCC)  - DULoxetine (CYMBALTA) 60 MG capsule; Take 1 capsule (60 mg total) by mouth at bedtime.  Dispense: 90 capsule; Refill: 1  7. BRCA1 positive   8.  Thrombocytopenia ([place])  Under the care of hematologist, likely from chemo   9. GERD without esophagitis  - omeprazole (PRILOSEC) 20 MG capsule; Take 1 capsule (20 mg total) by mouth daily.  Dispense: 90 capsule; Refill: 1  10. Migraine with aura and without status migrainosus, not intractable  - metoprolol succinate (TOPROL-XL) 25 MG 24 hr tablet; Take 0.5 tablets (12.5 mg total) by mouth daily.  Dispense: 45 tablet; Refill: 1  11. DDD (degenerative disc disease), lumbar  - DULoxetine (CYMBALTA) 60 MG capsule; Take 1 capsule (60 mg total) by mouth at bedtime.  Dispense: 90 capsule; Refill: 1  12. Tachycardia  - metoprolol succinate (TOPROL-XL) 25 MG 24 hr tablet; Take 0.5 tablets (12.5 mg total) by mouth daily.  Dispense: 45 tablet; Refill: 1  13. Chemotherapy-induced neuropathy (HCC)  - pregabalin (LYRICA) 50 MG capsule; Take 1 capsule (50 mg total) by mouth 3 (three) times daily.  Dispense: 90 capsule; Refill: 0

## 2020-06-09 NOTE — Telephone Encounter (Signed)
Oral Oncology Patient Advocate Encounter  I spoke with [name] this morning to set up delivery of Lynparza.  Address verified for shipment.  [name] will be filled through [facility] and mailed 6/14 for delivery 6/15.  Patient knows to not start medication when it is delivered.  [name] is to bring it to her appointment on 06/30/20.    Patient also stated that [name] had a new plan with Medicaid that starts in July.  I asked her to bring her card to her appointment and have it scanned in.  I will verify if there needs to be any changes made for billing at the pharmacy for her next refill.    [facility] will call 7-10 days before next refill is due to complete adherence call and set up delivery of medication.     [place] Patient [place] Phone [phone] Fax [phone] 06/09/2020 8:57 AM

## 2020-06-18 DIAGNOSIS — H40033 Anatomical narrow angle, bilateral: Secondary | ICD-10-CM | POA: Diagnosis not present

## 2020-06-18 DIAGNOSIS — H16223 Keratoconjunctivitis sicca, not specified as Sjogren's, bilateral: Secondary | ICD-10-CM | POA: Diagnosis not present

## 2020-06-21 DIAGNOSIS — H5213 Myopia, bilateral: Secondary | ICD-10-CM | POA: Diagnosis not present

## 2020-06-23 ENCOUNTER — Ambulatory Visit: Payer: Medicaid Other | Admitting: Internal Medicine

## 2020-06-23 ENCOUNTER — Other Ambulatory Visit: Payer: Medicaid Other

## 2020-06-23 ENCOUNTER — Ambulatory Visit: Payer: Medicaid Other

## 2020-06-27 MED FILL — LYNPARZA 150 MG TABLET: 150 | 30 days supply | Qty: 120 | Fill #1

## 2020-06-30 ENCOUNTER — Inpatient Hospital Stay (HOSPITAL_BASED_OUTPATIENT_CLINIC_OR_DEPARTMENT_OTHER): Payer: Medicaid Other | Admitting: Internal Medicine

## 2020-06-30 ENCOUNTER — Inpatient Hospital Stay: Payer: Medicaid Other

## 2020-06-30 ENCOUNTER — Inpatient Hospital Stay: Payer: Medicaid Other | Attending: Internal Medicine

## 2020-06-30 ENCOUNTER — Other Ambulatory Visit: Payer: Self-pay

## 2020-06-30 ENCOUNTER — Encounter: Payer: Self-pay | Admitting: Internal Medicine

## 2020-06-30 DIAGNOSIS — C482 Malignant neoplasm of peritoneum, unspecified: Secondary | ICD-10-CM | POA: Diagnosis not present

## 2020-06-30 DIAGNOSIS — Z5112 Encounter for antineoplastic immunotherapy: Secondary | ICD-10-CM | POA: Diagnosis not present

## 2020-06-30 DIAGNOSIS — Z7189 Other specified counseling: Secondary | ICD-10-CM

## 2020-06-30 LAB — CBC WITH DIFFERENTIAL/PLATELET
Abs Immature Granulocytes: 0.01 10*3/uL (ref 0.00–0.07)
Basophils Absolute: 0 10*3/uL (ref 0.0–0.1)
Basophils Relative: 1 %
Eosinophils Absolute: 0.1 10*3/uL (ref 0.0–0.5)
Eosinophils Relative: 2 %
HCT: 34.9 % — ABNORMAL LOW (ref 36.0–46.0)
Hemoglobin: 11.5 g/dL — ABNORMAL LOW (ref 12.0–15.0)
Immature Granulocytes: 0 %
Lymphocytes Relative: 41 %
Lymphs Abs: 1.4 10*3/uL (ref 0.7–4.0)
MCH: 33 pg (ref 26.0–34.0)
MCHC: 33 g/dL (ref 30.0–36.0)
MCV: 100 fL (ref 80.0–100.0)
Monocytes Absolute: 0.4 10*3/uL (ref 0.1–1.0)
Monocytes Relative: 10 %
Neutro Abs: 1.6 10*3/uL — ABNORMAL LOW (ref 1.7–7.7)
Neutrophils Relative %: 46 %
Platelets: 78 10*3/uL — ABNORMAL LOW (ref 150–400)
RBC: 3.49 MIL/uL — ABNORMAL LOW (ref 3.87–5.11)
RDW: 18 % — ABNORMAL HIGH (ref 11.5–15.5)
Smear Review: DECREASED
WBC: 3.5 10*3/uL — ABNORMAL LOW (ref 4.0–10.5)
nRBC: 0 % (ref 0.0–0.2)

## 2020-06-30 LAB — COMPREHENSIVE METABOLIC PANEL
ALT: 18 U/L (ref 0–44)
AST: 18 U/L (ref 15–41)
Albumin: 3.5 g/dL (ref 3.5–5.0)
Alkaline Phosphatase: 65 U/L (ref 38–126)
Anion gap: 11 (ref 5–15)
BUN: 12 mg/dL (ref 6–20)
CO2: 26 mmol/L (ref 22–32)
Calcium: 8.8 mg/dL — ABNORMAL LOW (ref 8.9–10.3)
Chloride: 104 mmol/L (ref 98–111)
Creatinine, Ser: 0.62 mg/dL (ref 0.44–1.00)
GFR calc Af Amer: >60 mL/min (ref >60)
GFR calc non Af Amer: >60 mL/min (ref >60)
Glucose, Bld: 115 mg/dL — ABNORMAL HIGH (ref 70–99)
Potassium: 3.8 mmol/L (ref 3.5–5.1)
Sodium: 141 mmol/L (ref 135–145)
Total Bilirubin: 0.4 mg/dL (ref 0.3–1.2)
Total Protein: 7.2 g/dL (ref 6.5–8.1)

## 2020-06-30 LAB — URINALYSIS, COMPLETE (UACMP) WITH MICROSCOPIC
Bilirubin Urine: NEGATIVE
Glucose, UA: NEGATIVE mg/dL
Hgb urine dipstick: NEGATIVE
Ketones, ur: NEGATIVE mg/dL
Nitrite: NEGATIVE
Protein, ur: NEGATIVE mg/dL
Specific Gravity, Urine: 1.02 (ref 1.005–1.030)
pH: 5 (ref 5.0–8.0)

## 2020-06-30 MED ORDER — SODIUM CHLORIDE 0.9% FLUSH
10.0000 mL | Freq: Once | INTRAVENOUS | Status: AC
  Administered 2020-06-30: 10 mL via INTRAVENOUS
  Filled 2020-06-30: qty 10

## 2020-06-30 MED ORDER — DEXAMETHASONE SODIUM PHOSPHATE 10 MG/ML IJ SOLN
4.0000 mg | Freq: Once | INTRAMUSCULAR | Status: AC
  Administered 2020-06-30: 4 mg via INTRAVENOUS
  Filled 2020-06-30: qty 1

## 2020-06-30 MED ORDER — SODIUM CHLORIDE 0.9 % IV SOLN
Freq: Once | INTRAVENOUS | Status: AC
  Filled 2020-06-30: qty 250

## 2020-06-30 MED ORDER — ONDANSETRON HCL 8 MG PO TABS: ORAL_TABLET | ORAL | 1 refills | Status: DC

## 2020-06-30 MED ORDER — HEPARIN SOD (PORK) LOCK FLUSH 100 UNIT/ML IV SOLN
500.0000 [IU] | Freq: Once | INTRAVENOUS | Status: AC | PRN
  Administered 2020-06-30: 500 [IU]
  Filled 2020-06-30: qty 5

## 2020-06-30 MED ORDER — SODIUM CHLORIDE 0.9 % IV SOLN: 4.0000 mg | Freq: Once | INTRAVENOUS | Status: DC

## 2020-06-30 MED ORDER — HEPARIN SOD (PORK) LOCK FLUSH 100 UNIT/ML IV SOLN
INTRAVENOUS | Status: AC
  Filled 2020-06-30: qty 5

## 2020-06-30 MED ORDER — PROCHLORPERAZINE MALEATE 10 MG PO TABS: 10.0000 mg | ORAL_TABLET | Freq: Four times a day (QID) | ORAL | 1 refills | Status: DC | PRN

## 2020-06-30 MED ORDER — SODIUM CHLORIDE 0.9 % IV SOLN
15.0000 mg/kg | Freq: Once | INTRAVENOUS | Status: AC
  Administered 2020-06-30: 1700 mg via INTRAVENOUS
  Filled 2020-06-30: qty 64
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
## 2020-06-30 NOTE — Assessment & Plan Note (Addendum)
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 Debulking surgery on 4/14.  Currently s/p carbo-taxol-Avastin. Clinically-improving; June [down from 8000 baseline].  #Proceed with bev maintenance today. Labs today reviewed;  acceptable for treatment today-platelets- 73; add Olaparib 300 BID.  Again reviewed the potential side effects of olaparib including but not limited to nausea vomiting diarrhea.  Recommend antiemetics prior to taking olaparib if needed.  #Moderate thrombocytopenia platelets-70s -from carboplatin chemo.  Will monitor closely with weekly CBC while on olaparib.  # PN G-2- on Lyrica 50 mg TID; will continue current dose; will monitor for now.    # DISPOSITION:  # chemo today; # weekly cbc x2  #Follow-up in 3 weeks-MD labs-CBC CMP CA 125; UA; avastin - Dr.B

## 2020-07-01 LAB — CA 125: Cancer Antigen (CA) 125: 14.9 U/mL (ref 0.0–38.1)

## 2020-07-07 ENCOUNTER — Inpatient Hospital Stay: Payer: Medicaid Other

## 2020-07-07 ENCOUNTER — Other Ambulatory Visit: Payer: Self-pay

## 2020-07-07 DIAGNOSIS — Z5112 Encounter for antineoplastic immunotherapy: Secondary | ICD-10-CM | POA: Diagnosis not present

## 2020-07-07 DIAGNOSIS — C482 Malignant neoplasm of peritoneum, unspecified: Secondary | ICD-10-CM

## 2020-07-07 LAB — CBC WITH DIFFERENTIAL/PLATELET
Abs Immature Granulocytes: 0.02 10*3/uL (ref 0.00–0.07)
Basophils Absolute: 0 10*3/uL (ref 0.0–0.1)
Basophils Relative: 1 %
Eosinophils Absolute: 0.1 10*3/uL (ref 0.0–0.5)
Eosinophils Relative: 3 %
HCT: 37.5 % (ref 36.0–46.0)
Hemoglobin: 12.8 g/dL (ref 12.0–15.0)
Immature Granulocytes: 1 %
Lymphocytes Relative: 33 %
Lymphs Abs: 1.2 10*3/uL (ref 0.7–4.0)
MCH: 33.4 pg (ref 26.0–34.0)
MCHC: 34.1 g/dL (ref 30.0–36.0)
MCV: 97.9 fL (ref 80.0–100.0)
Monocytes Absolute: 0.4 10*3/uL (ref 0.1–1.0)
Monocytes Relative: 11 %
Neutro Abs: 1.9 10*3/uL (ref 1.7–7.7)
Neutrophils Relative %: 51 %
Platelets: 125 10*3/uL — ABNORMAL LOW (ref 150–400)
RBC: 3.83 MIL/uL — ABNORMAL LOW (ref 3.87–5.11)
RDW: 18.6 % — ABNORMAL HIGH (ref 11.5–15.5)
WBC: 3.7 10*3/uL — ABNORMAL LOW (ref 4.0–10.5)
nRBC: 0.5 % — ABNORMAL HIGH (ref 0.0–0.2)

## 2020-07-14 ENCOUNTER — Inpatient Hospital Stay: Payer: Medicaid Other

## 2020-07-14 ENCOUNTER — Other Ambulatory Visit: Payer: Self-pay

## 2020-07-14 DIAGNOSIS — C482 Malignant neoplasm of peritoneum, unspecified: Secondary | ICD-10-CM

## 2020-07-14 DIAGNOSIS — Z5112 Encounter for antineoplastic immunotherapy: Secondary | ICD-10-CM | POA: Diagnosis not present

## 2020-07-14 LAB — CBC WITH DIFFERENTIAL/PLATELET
Abs Immature Granulocytes: 0.02 10*3/uL (ref 0.00–0.07)
Basophils Absolute: 0 10*3/uL (ref 0.0–0.1)
Basophils Relative: 1 %
Eosinophils Absolute: 0.1 10*3/uL (ref 0.0–0.5)
Eosinophils Relative: 1 %
HCT: 37.4 % (ref 36.0–46.0)
Hemoglobin: 12.7 g/dL (ref 12.0–15.0)
Immature Granulocytes: 1 %
Lymphocytes Relative: 29 %
Lymphs Abs: 1.2 10*3/uL (ref 0.7–4.0)
MCH: 33.6 pg (ref 26.0–34.0)
MCHC: 34 g/dL (ref 30.0–36.0)
MCV: 98.9 fL (ref 80.0–100.0)
Monocytes Absolute: 0.3 10*3/uL (ref 0.1–1.0)
Monocytes Relative: 8 %
Neutro Abs: 2.6 10*3/uL (ref 1.7–7.7)
Neutrophils Relative %: 60 %
Platelets: 109 10*3/uL — ABNORMAL LOW (ref 150–400)
RBC: 3.78 MIL/uL — ABNORMAL LOW (ref 3.87–5.11)
RDW: 18.9 % — ABNORMAL HIGH (ref 11.5–15.5)
WBC: 4.4 10*3/uL (ref 4.0–10.5)
nRBC: 0 % (ref 0.0–0.2)

## 2020-07-16 ENCOUNTER — Other Ambulatory Visit: Payer: Self-pay

## 2020-07-16 ENCOUNTER — Inpatient Hospital Stay (HOSPITAL_BASED_OUTPATIENT_CLINIC_OR_DEPARTMENT_OTHER): Payer: Medicaid Other | Admitting: Oncology

## 2020-07-16 ENCOUNTER — Inpatient Hospital Stay: Payer: Medicaid Other

## 2020-07-16 ENCOUNTER — Inpatient Hospital Stay (HOSPITAL_BASED_OUTPATIENT_CLINIC_OR_DEPARTMENT_OTHER): Payer: Medicaid Other | Admitting: Hospice and Palliative Medicine

## 2020-07-16 VITALS — BP 113/92 | HR 71 | Temp 97.2°F | Resp 16 | Wt 257.0 lb

## 2020-07-16 DIAGNOSIS — Z5112 Encounter for antineoplastic immunotherapy: Secondary | ICD-10-CM | POA: Diagnosis not present

## 2020-07-16 DIAGNOSIS — R1031 Right lower quadrant pain: Secondary | ICD-10-CM | POA: Diagnosis not present

## 2020-07-16 DIAGNOSIS — Z515 Encounter for palliative care: Secondary | ICD-10-CM | POA: Diagnosis not present

## 2020-07-16 DIAGNOSIS — G893 Neoplasm related pain (acute) (chronic): Secondary | ICD-10-CM | POA: Diagnosis not present

## 2020-07-16 DIAGNOSIS — C482 Malignant neoplasm of peritoneum, unspecified: Secondary | ICD-10-CM

## 2020-07-16 LAB — COMPREHENSIVE METABOLIC PANEL
ALT: 15 U/L (ref 0–44)
AST: 19 U/L (ref 15–41)
Albumin: 3.9 g/dL (ref 3.5–5.0)
Alkaline Phosphatase: 62 U/L (ref 38–126)
Anion gap: 9 (ref 5–15)
BUN: 13 mg/dL (ref 6–20)
CO2: 25 mmol/L (ref 22–32)
Calcium: 9.3 mg/dL (ref 8.9–10.3)
Chloride: 101 mmol/L (ref 98–111)
Creatinine, Ser: 0.96 mg/dL (ref 0.44–1.00)
GFR calc Af Amer: >60 mL/min (ref >60)
GFR calc non Af Amer: >60 mL/min (ref >60)
Glucose, Bld: 115 mg/dL — ABNORMAL HIGH (ref 70–99)
Potassium: 3.9 mmol/L (ref 3.5–5.1)
Sodium: 135 mmol/L (ref 135–145)
Total Bilirubin: 0.5 mg/dL (ref 0.3–1.2)
Total Protein: 7.7 g/dL (ref 6.5–8.1)

## 2020-07-16 LAB — CBC WITH DIFFERENTIAL/PLATELET
Abs Immature Granulocytes: 0.01 10*3/uL (ref 0.00–0.07)
Basophils Absolute: 0 10*3/uL (ref 0.0–0.1)
Basophils Relative: 1 %
Eosinophils Absolute: 0.1 10*3/uL (ref 0.0–0.5)
Eosinophils Relative: 1 %
HCT: 36.7 % (ref 36.0–46.0)
Hemoglobin: 12.4 g/dL (ref 12.0–15.0)
Immature Granulocytes: 0 %
Lymphocytes Relative: 29 %
Lymphs Abs: 1.2 10*3/uL (ref 0.7–4.0)
MCH: 33.9 pg (ref 26.0–34.0)
MCHC: 33.8 g/dL (ref 30.0–36.0)
MCV: 100.3 fL — ABNORMAL HIGH (ref 80.0–100.0)
Monocytes Absolute: 0.3 10*3/uL (ref 0.1–1.0)
Monocytes Relative: 8 %
Neutro Abs: 2.6 10*3/uL (ref 1.7–7.7)
Neutrophils Relative %: 61 %
Platelets: 107 10*3/uL — ABNORMAL LOW (ref 150–400)
RBC: 3.66 MIL/uL — ABNORMAL LOW (ref 3.87–5.11)
RDW: 19.5 % — ABNORMAL HIGH (ref 11.5–15.5)
WBC: 4.2 10*3/uL (ref 4.0–10.5)
nRBC: 0 % (ref 0.0–0.2)

## 2020-07-16 MED ORDER — OXYCODONE HCL 5 MG PO TABS: 5.0000 mg | ORAL_TABLET | Freq: Three times a day (TID) | ORAL | 0 refills | Status: DC | PRN

## 2020-07-16 NOTE — Progress Notes (Signed)
Virtual Visit via Video Note  I connected with [name] on 07/16/20 at 10:30 AM EDT by a video enabled telemedicine application and verified that I am speaking with the correct person using two identifiers.   I discussed the limitations of evaluation and management by telemedicine and the availability of in person appointments. The patient expressed understanding and agreed to proceed.  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  Patient is also status post neoadjuvant carbo/Taxol on maintenance Avastin.  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  Patient has also had chronic abdominal pain.  She was referred to palliative care to help address goals and manage ongoing symptoms.   Observations/Objective: Had a virtual visit with patient today.  She reports that overall she is doing about the same.  She is concerned about 24 hours of right-sided persistent abdominal pain.  Pain is different from her normal chronic abdominal pain.  She described it as feeling like "ovarian pain" despite her previous BSO.  Patient denies fever or chills.  Denies urinary symptoms.  Denies abdominal distention.  She is having regular bowel movements.  No nausea or vomiting.  Discussed with Dr. Rogue Bussing.  Given the risk of bowel perforation with Avastin, he would recommend clinical evaluation and possible consideration of abdominal CT.  PMDP reviewed.   Assessment and Plan: Stage IV serous adenocarcinoma -on maintenance Avastin.  Followed by Dr. Rogue Bussing with follow-up scheduled on 7/27  Abdominal pain  -given risk of bowel preparation on Avastin.  We will schedule clinic visit in [facility] for possible consideration of abdominal CT.  Will refill patient's oxycodone today.  Follow Up Instructions: Follow up My Chart visit in about a month   I discussed the assessment and treatment plan with the patient. The patient was provided an opportunity to ask questions and all were answered. The patient agreed with the plan and demonstrated an understanding of the instructions.   The patient was advised to call back or seek an in-person evaluation if the symptoms worsen or if the condition fails to improve as anticipated.  I provided 15 minutes of non-face-to-face time during this encounter.   Irean Hong, NP
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
## 2020-07-21 ENCOUNTER — Inpatient Hospital Stay: Payer: Medicaid Other | Admitting: Internal Medicine

## 2020-07-21 ENCOUNTER — Encounter: Payer: Self-pay | Admitting: *Deleted

## 2020-07-21 ENCOUNTER — Telehealth: Payer: Self-pay | Admitting: *Deleted

## 2020-07-21 ENCOUNTER — Inpatient Hospital Stay: Payer: Medicaid Other

## 2020-07-21 NOTE — Telephone Encounter (Signed)
Patient no showed for her chemotherapy apts today. I attempted to reach patient. Pt did not answer her telephone. I was unable to leave a vm for patient. mychart msg sent to patient- to request that patient r/s her apts.

## 2020-07-23 ENCOUNTER — Inpatient Hospital Stay: Payer: Medicaid Other

## 2020-07-23 ENCOUNTER — Encounter: Payer: Self-pay | Admitting: Internal Medicine

## 2020-07-23 ENCOUNTER — Other Ambulatory Visit: Payer: Self-pay

## 2020-07-23 ENCOUNTER — Inpatient Hospital Stay (HOSPITAL_BASED_OUTPATIENT_CLINIC_OR_DEPARTMENT_OTHER): Payer: Medicaid Other | Admitting: Internal Medicine

## 2020-07-23 DIAGNOSIS — Z5112 Encounter for antineoplastic immunotherapy: Secondary | ICD-10-CM | POA: Diagnosis not present

## 2020-07-23 DIAGNOSIS — C482 Malignant neoplasm of peritoneum, unspecified: Secondary | ICD-10-CM

## 2020-07-23 DIAGNOSIS — G620 Drug-induced polyneuropathy: Secondary | ICD-10-CM | POA: Diagnosis not present

## 2020-07-23 DIAGNOSIS — T451X5A Adverse effect of antineoplastic and immunosuppressive drugs, initial encounter: Secondary | ICD-10-CM

## 2020-07-23 DIAGNOSIS — Z7189 Other specified counseling: Secondary | ICD-10-CM

## 2020-07-23 LAB — CBC WITH DIFFERENTIAL/PLATELET
Abs Immature Granulocytes: 0.01 10*3/uL (ref 0.00–0.07)
Basophils Absolute: 0 10*3/uL (ref 0.0–0.1)
Basophils Relative: 0 %
Eosinophils Absolute: 0.1 10*3/uL (ref 0.0–0.5)
Eosinophils Relative: 3 %
HCT: 33.1 % — ABNORMAL LOW (ref 36.0–46.0)
Hemoglobin: 11.3 g/dL — ABNORMAL LOW (ref 12.0–15.0)
Immature Granulocytes: 0 %
Lymphocytes Relative: 49 %
Lymphs Abs: 1.6 10*3/uL (ref 0.7–4.0)
MCH: 34.2 pg — ABNORMAL HIGH (ref 26.0–34.0)
MCHC: 34.1 g/dL (ref 30.0–36.0)
MCV: 100.3 fL — ABNORMAL HIGH (ref 80.0–100.0)
Monocytes Absolute: 0.3 10*3/uL (ref 0.1–1.0)
Monocytes Relative: 10 %
Neutro Abs: 1.3 10*3/uL — ABNORMAL LOW (ref 1.7–7.7)
Neutrophils Relative %: 38 %
Platelets: 119 10*3/uL — ABNORMAL LOW (ref 150–400)
RBC: 3.3 MIL/uL — ABNORMAL LOW (ref 3.87–5.11)
RDW: 19.4 % — ABNORMAL HIGH (ref 11.5–15.5)
Smear Review: NORMAL
WBC: 3.4 10*3/uL — ABNORMAL LOW (ref 4.0–10.5)
nRBC: 0 % (ref 0.0–0.2)

## 2020-07-23 LAB — COMPREHENSIVE METABOLIC PANEL
ALT: 15 U/L (ref 0–44)
AST: 19 U/L (ref 15–41)
Albumin: 3.7 g/dL (ref 3.5–5.0)
Alkaline Phosphatase: 59 U/L (ref 38–126)
Anion gap: 9 (ref 5–15)
BUN: 15 mg/dL (ref 6–20)
CO2: 26 mmol/L (ref 22–32)
Calcium: 8.9 mg/dL (ref 8.9–10.3)
Chloride: 103 mmol/L (ref 98–111)
Creatinine, Ser: 0.74 mg/dL (ref 0.44–1.00)
GFR calc Af Amer: >60 mL/min (ref >60)
GFR calc non Af Amer: >60 mL/min (ref >60)
Glucose, Bld: 137 mg/dL — ABNORMAL HIGH (ref 70–99)
Potassium: 3.6 mmol/L (ref 3.5–5.1)
Sodium: 138 mmol/L (ref 135–145)
Total Bilirubin: 0.9 mg/dL (ref 0.3–1.2)
Total Protein: 7 g/dL (ref 6.5–8.1)

## 2020-07-23 LAB — URINALYSIS, COMPLETE (UACMP) WITH MICROSCOPIC
Bacteria, UA: NONE SEEN
Bilirubin Urine: NEGATIVE
Glucose, UA: NEGATIVE mg/dL
Hgb urine dipstick: NEGATIVE
Ketones, ur: NEGATIVE mg/dL
Nitrite: NEGATIVE
Protein, ur: NEGATIVE mg/dL
Specific Gravity, Urine: 1.024 (ref 1.005–1.030)
pH: 5 (ref 5.0–8.0)

## 2020-07-23 MED ORDER — DEXAMETHASONE SODIUM PHOSPHATE 10 MG/ML IJ SOLN
4.0000 mg | Freq: Once | INTRAMUSCULAR | Status: AC
  Administered 2020-07-23: 4 mg via INTRAVENOUS
  Filled 2020-07-23: qty 1

## 2020-07-23 MED ORDER — SODIUM CHLORIDE 0.9 % IV SOLN: 4.0000 mg | Freq: Once | INTRAVENOUS | Status: DC

## 2020-07-23 MED ORDER — PREGABALIN 50 MG PO CAPS: 50.0000 mg | ORAL_CAPSULE | Freq: Three times a day (TID) | ORAL | 2 refills | Status: DC

## 2020-07-23 MED ORDER — HEPARIN SOD (PORK) LOCK FLUSH 100 UNIT/ML IV SOLN
500.0000 [IU] | Freq: Once | INTRAVENOUS | Status: AC | PRN
  Administered 2020-07-23: 500 [IU]
  Filled 2020-07-23: qty 5

## 2020-07-23 MED ORDER — SODIUM CHLORIDE 0.9 % IV SOLN
15.0000 mg/kg | Freq: Once | INTRAVENOUS | Status: AC
  Administered 2020-07-23: 1700 mg via INTRAVENOUS
  Filled 2020-07-23: qty 64

## 2020-07-23 MED ORDER — HEPARIN SOD (PORK) LOCK FLUSH 100 UNIT/ML IV SOLN
INTRAVENOUS | Status: AC
  Filled 2020-07-23: qty 5

## 2020-07-23 MED ORDER — SODIUM CHLORIDE 0.9 % IV SOLN
Freq: Once | INTRAVENOUS | Status: AC
  Filled 2020-07-23: qty 250

## 2020-07-23 MED ORDER — SODIUM CHLORIDE 0.9% FLUSH
10.0000 mL | Freq: Once | INTRAVENOUS | Status: AC
  Administered 2020-07-23: 10 mL via INTRAVENOUS
  Filled 2020-07-23: qty 10

## 2020-07-23 NOTE — Assessment & Plan Note (Addendum)
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  # Proceed with Bev [+Lymparza]. Labs today reviewed;  acceptable for treatment today.   # Nausea G-1; sec to Lymparza prn.   # PN G-2- on Lyrica 50 mg TID;STABLE.refilled.   # DISPOSITION:  # chemo today #Follow-up in 3 weeks-MD labs-CBC CMP CA 125; UA; avastin - Dr.B
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
## 2020-07-24 LAB — CA 125: Cancer Antigen (CA) 125: 12 U/mL (ref 0.0–38.1)

## 2020-07-29 ENCOUNTER — Ambulatory Visit: Payer: Medicaid Other | Admitting: Family Medicine

## 2020-08-05 ENCOUNTER — Encounter: Payer: Self-pay | Admitting: Obstetrics and Gynecology

## 2020-08-06 ENCOUNTER — Other Ambulatory Visit: Payer: Self-pay | Admitting: *Deleted

## 2020-08-06 NOTE — Telephone Encounter (Signed)
I completed paper request from her pharmacy yesterday and it was faxed. I sent three months. Can you check with pharmacy to see if they received it? Not sure why the request wasn't electronic.

## 2020-08-06 NOTE — Telephone Encounter (Signed)
Do you want to RF the patches at this time. Patient has sent a request.

## 2020-08-06 NOTE — Telephone Encounter (Signed)
Chart reviewed. Script faxed to [facility] in [place].

## 2020-08-06 NOTE — Telephone Encounter (Signed)
Last patch RF was in May by Dr. Theora Gianotti

## 2020-08-06 NOTE — Telephone Encounter (Signed)
Patient sent mychart msg to request for RF on clonidine patches (0.2 mg-once a week). Lauren, Please advise. Dr. Ancil Boozer prescribed clonidine tablets (Take 1 tablet (0.1 mg total) by mouth at bedtime) in June for the patient and Dr. Theora Gianotti prescribe the patches in May with 3 RFs. Is patient taking both???   -Keimya Briddell

## 2020-08-06 NOTE — Telephone Encounter (Signed)
I sent it yesterday

## 2020-08-06 NOTE — Telephone Encounter (Signed)
My understanding was that patient was having breakthrough sweats at night despite patches and Dr. Ancil Boozer wrote for an additional tablet to take at night to control nocturnal night sweats. She would be able to take this in addition to patches. I am happy to discuss with her in more detail or see her for follow up for the hot flashes. Please let me know. Thanks!

## 2020-08-11 ENCOUNTER — Other Ambulatory Visit: Payer: Self-pay | Admitting: Internal Medicine

## 2020-08-13 ENCOUNTER — Inpatient Hospital Stay: Payer: Medicaid Other

## 2020-08-13 ENCOUNTER — Other Ambulatory Visit: Payer: Self-pay

## 2020-08-13 ENCOUNTER — Inpatient Hospital Stay (HOSPITAL_BASED_OUTPATIENT_CLINIC_OR_DEPARTMENT_OTHER): Payer: Medicaid Other | Admitting: Internal Medicine

## 2020-08-13 ENCOUNTER — Inpatient Hospital Stay: Payer: Medicaid Other | Admitting: Pharmacist

## 2020-08-13 ENCOUNTER — Inpatient Hospital Stay: Payer: Medicaid Other | Attending: Internal Medicine

## 2020-08-13 ENCOUNTER — Other Ambulatory Visit: Payer: Self-pay | Admitting: *Deleted

## 2020-08-13 ENCOUNTER — Encounter: Payer: Self-pay | Admitting: Internal Medicine

## 2020-08-13 DIAGNOSIS — Z95828 Presence of other vascular implants and grafts: Secondary | ICD-10-CM

## 2020-08-13 DIAGNOSIS — D6481 Anemia due to antineoplastic chemotherapy: Secondary | ICD-10-CM | POA: Diagnosis not present

## 2020-08-13 DIAGNOSIS — C482 Malignant neoplasm of peritoneum, unspecified: Secondary | ICD-10-CM

## 2020-08-13 DIAGNOSIS — R519 Headache, unspecified: Secondary | ICD-10-CM | POA: Insufficient documentation

## 2020-08-13 DIAGNOSIS — G620 Drug-induced polyneuropathy: Secondary | ICD-10-CM | POA: Insufficient documentation

## 2020-08-13 DIAGNOSIS — F17210 Nicotine dependence, cigarettes, uncomplicated: Secondary | ICD-10-CM | POA: Insufficient documentation

## 2020-08-13 LAB — COMPREHENSIVE METABOLIC PANEL
ALT: 14 U/L (ref 0–44)
AST: 17 U/L (ref 15–41)
Albumin: 3.4 g/dL — ABNORMAL LOW (ref 3.5–5.0)
Alkaline Phosphatase: 69 U/L (ref 38–126)
Anion gap: 6 (ref 5–15)
BUN: 14 mg/dL (ref 6–20)
CO2: 27 mmol/L (ref 22–32)
Calcium: 8.7 mg/dL — ABNORMAL LOW (ref 8.9–10.3)
Chloride: 106 mmol/L (ref 98–111)
Creatinine, Ser: 0.79 mg/dL (ref 0.44–1.00)
GFR calc Af Amer: >60 mL/min (ref >60)
GFR calc non Af Amer: >60 mL/min (ref >60)
Glucose, Bld: 150 mg/dL — ABNORMAL HIGH (ref 70–99)
Potassium: 4 mmol/L (ref 3.5–5.1)
Sodium: 139 mmol/L (ref 135–145)
Total Bilirubin: 0.3 mg/dL (ref 0.3–1.2)
Total Protein: 7.1 g/dL (ref 6.5–8.1)

## 2020-08-13 LAB — CBC WITH DIFFERENTIAL/PLATELET
Abs Immature Granulocytes: 0.01 10*3/uL (ref 0.00–0.07)
Basophils Absolute: 0 10*3/uL (ref 0.0–0.1)
Basophils Relative: 1 %
Eosinophils Absolute: 0.1 10*3/uL (ref 0.0–0.5)
Eosinophils Relative: 2 %
HCT: 25.5 % — ABNORMAL LOW (ref 36.0–46.0)
Hemoglobin: 8.8 g/dL — ABNORMAL LOW (ref 12.0–15.0)
Immature Granulocytes: 0 %
Lymphocytes Relative: 26 %
Lymphs Abs: 1.2 10*3/uL (ref 0.7–4.0)
MCH: 34.6 pg — ABNORMAL HIGH (ref 26.0–34.0)
MCHC: 34.5 g/dL (ref 30.0–36.0)
MCV: 100.4 fL — ABNORMAL HIGH (ref 80.0–100.0)
Monocytes Absolute: 0.4 10*3/uL (ref 0.1–1.0)
Monocytes Relative: 9 %
Neutro Abs: 2.9 10*3/uL (ref 1.7–7.7)
Neutrophils Relative %: 62 %
Platelets: 100 10*3/uL — ABNORMAL LOW (ref 150–400)
RBC: 2.54 MIL/uL — ABNORMAL LOW (ref 3.87–5.11)
RDW: 18 % — ABNORMAL HIGH (ref 11.5–15.5)
WBC: 4.7 10*3/uL (ref 4.0–10.5)
nRBC: 0 % (ref 0.0–0.2)

## 2020-08-13 LAB — IRON AND TIBC
Iron: 273 ug/dL — ABNORMAL HIGH (ref 28–170)
Saturation Ratios: 98 % — ABNORMAL HIGH (ref 10.4–31.8)
TIBC: 280 ug/dL (ref 250–450)
UIBC: 7 ug/dL

## 2020-08-13 LAB — URINALYSIS, COMPLETE (UACMP) WITH MICROSCOPIC
Bilirubin Urine: NEGATIVE
Glucose, UA: NEGATIVE mg/dL
Hgb urine dipstick: NEGATIVE
Ketones, ur: NEGATIVE mg/dL
Leukocytes,Ua: NEGATIVE
Nitrite: NEGATIVE
Protein, ur: NEGATIVE mg/dL
Specific Gravity, Urine: 1.021 (ref 1.005–1.030)
pH: 5 (ref 5.0–8.0)

## 2020-08-13 LAB — LACTATE DEHYDROGENASE: LDH: 107 U/L (ref 98–192)

## 2020-08-13 LAB — FERRITIN: Ferritin: 434 ng/mL — ABNORMAL HIGH (ref 11–307)

## 2020-08-13 MED ORDER — HEPARIN SOD (PORK) LOCK FLUSH 100 UNIT/ML IV SOLN
INTRAVENOUS | Status: AC
  Filled 2020-08-13: qty 5

## 2020-08-13 MED ORDER — HEPARIN SOD (PORK) LOCK FLUSH 100 UNIT/ML IV SOLN
500.0000 [IU] | Freq: Once | INTRAVENOUS | Status: AC
  Administered 2020-08-13: 500 [IU]
  Filled 2020-08-13: qty 5
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
## 2020-08-13 NOTE — Assessment & Plan Note (Addendum)
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   # HOLD with Bev [+Lymparza]. Labs today reviewed- worsening anemia/headaches- see below  # Headaches- sec to Bev/? Zofran. If not better then MRI brain.   # worsening anemia sec to Lyparza. HOLD for above reasons.  Check iron studies ferritin LDH  # Nausea G-1; sec to Lymparza prn.   # PN G-2- on Lyrica 50 mg TID STABLE   # DISPOSITION:  # HOLD chemo today; de-access #Follow-up in 2 weeks-MD labs-CBC CMP CA 125; UA; avastin - Dr.B 

## 2020-08-13 NOTE — Progress Notes (Signed)
Oral Chemotherapy Pharmacist Encounter   [name] held after MD visit today. Pharmacy clinic visit deferred until patient resumes treatment.  Darl Pikes, PharmD, BCPS, BCOP, CPP Hematology/Oncology Clinical Pharmacist [facility]/HP/AP [facility] [phone]  08/13/2020 3:44 PM

## 2020-08-14 LAB — CA 125: Cancer Antigen (CA) 125: 10.6 U/mL (ref 0.0–38.1)

## 2020-08-18 ENCOUNTER — Encounter: Payer: Self-pay | Admitting: Internal Medicine

## 2020-08-18 ENCOUNTER — Inpatient Hospital Stay (HOSPITAL_BASED_OUTPATIENT_CLINIC_OR_DEPARTMENT_OTHER): Payer: Medicaid Other | Admitting: Hospice and Palliative Medicine

## 2020-08-18 ENCOUNTER — Other Ambulatory Visit: Payer: Self-pay | Admitting: *Deleted

## 2020-08-18 DIAGNOSIS — Z515 Encounter for palliative care: Secondary | ICD-10-CM | POA: Diagnosis not present

## 2020-08-18 DIAGNOSIS — C482 Malignant neoplasm of peritoneum, unspecified: Secondary | ICD-10-CM

## 2020-08-18 DIAGNOSIS — R519 Headache, unspecified: Secondary | ICD-10-CM

## 2020-08-18 DIAGNOSIS — G893 Neoplasm related pain (acute) (chronic): Secondary | ICD-10-CM

## 2020-08-18 MED ORDER — OXYCODONE HCL 5 MG PO TABS
5.0000 mg | ORAL_TABLET | Freq: Three times a day (TID) | ORAL | 0 refills | Status: DC | PRN
Start: 2020-08-18 — End: 2020-10-01

## 2020-08-18 NOTE — Progress Notes (Signed)
Virtual Visit via Video Note  I connected with [name] on 08/18/20 at  2:00 PM EDT by a video enabled telemedicine application and verified that I am speaking with the correct person using two identifiers.   I discussed the limitations of evaluation and management by telemedicine and the availability of in person appointments. The patient expressed understanding and agreed to proceed.  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  Patient is also status post neoadjuvant carbo/Taxol on maintenance Avastin.  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  Patient has also had chronic abdominal pain.  She was referred to palliative care to help address goals and manage ongoing symptoms.   Observations/Objective: Had a virtual visit with patient today.  She reports that she has had a persistent throbbing headache for the past 2 weeks.  She has a history of migraines but this headache is different.  She has her Imitrex without any relief.  She is taking oxycodone as needed and finds that helps some.  Patient saw Dr. Rogue Bussing on 8/19 and treatments have been held with thought that that was causing her headaches.  However, headaches have not resolved or improved after holding treatments.  Assessment and Plan: Stage IV serous adenocarcinoma -on maintenance Avastin.  Followed by Dr. Rogue Bussing   Headache -spoke with Dr. Rogue Bussing.  Will order MRI of the brain with and without contrast.  Neoplasm related pain-  Will refill patient's oxycodone today (#60)  Follow Up Instructions: Follow up My  Chart visit in about a month   I discussed the assessment and treatment plan with the patient. The patient was provided an opportunity to ask questions and all were answered. The patient agreed with the plan and demonstrated an understanding of the instructions.   The patient was advised to call back or seek an in-person evaluation if the symptoms worsen or if the condition fails to improve as anticipated.  I provided 15 minutes of non-face-to-face time during this encounter.   Irean Hong, NP

## 2020-08-19 ENCOUNTER — Telehealth: Payer: Self-pay

## 2020-08-19 ENCOUNTER — Telehealth: Payer: Self-pay | Admitting: *Deleted

## 2020-08-19 NOTE — Telephone Encounter (Signed)
Per [facility] in [place]. MRI needs insurance authorization. auth submitted and pending.

## 2020-08-19 NOTE — Telephone Encounter (Signed)
PA for oxycodone 5 mg IR has been approved by insurance through 02/15/21. Ref # [id number]

## 2020-08-20 ENCOUNTER — Other Ambulatory Visit: Payer: Self-pay

## 2020-08-20 ENCOUNTER — Ambulatory Visit
Admission: RE | Admit: 2020-08-20 | Discharge: 2020-08-20 | Disposition: A | Discharge status: Home / Self Care | Payer: Medicaid Other | Source: Ambulatory Visit | Attending: Hospice and Palliative Medicine | Admitting: Hospice and Palliative Medicine

## 2020-08-20 DIAGNOSIS — J3489 Other specified disorders of nose and nasal sinuses: Secondary | ICD-10-CM | POA: Diagnosis not present

## 2020-08-20 DIAGNOSIS — C482 Malignant neoplasm of peritoneum, unspecified: Secondary | ICD-10-CM

## 2020-08-20 DIAGNOSIS — R519 Headache, unspecified: Secondary | ICD-10-CM

## 2020-08-20 MED ORDER — GADOBUTROL 1 MMOL/ML IV SOLN
10.0000 mL | Freq: Once | INTRAVENOUS | Status: AC | PRN
  Administered 2020-08-20: 10 mL via INTRAVENOUS

## 2020-08-21 ENCOUNTER — Telehealth: Payer: Self-pay | Admitting: Hospice and Palliative Medicine

## 2020-08-21 ENCOUNTER — Encounter: Payer: Self-pay | Admitting: Internal Medicine

## 2020-08-21 DIAGNOSIS — R519 Headache, unspecified: Secondary | ICD-10-CM

## 2020-08-21 NOTE — Telephone Encounter (Signed)
I called and spoke with patient.  We reviewed her MRI results, which were negative and did not explain persistent headaches.  Patient says she continues to have headaches and is now having right ear pain.  Will refer to ENT.  Nira Conn -will you please facilitate referral to [facility] ENT.

## 2020-08-23 ENCOUNTER — Telehealth: Payer: Self-pay | Admitting: Internal Medicine

## 2020-08-23 NOTE — Telephone Encounter (Signed)
On 8/27- Spoke to patient regarding her concerns for dizziness left-sided earache/headache.  MRI brain negative-for malignancy.  Recommend evaluation in the symptom management clinic on 8/30.   [facility]- Please call to schedule appt.

## 2020-08-24 NOTE — Telephone Encounter (Signed)
Patient c/o ear "infection pain". Msg left for patient to return my phone call to schedule apts in [facility]

## 2020-08-25 ENCOUNTER — Telehealth: Payer: Self-pay | Admitting: Obstetrics and Gynecology

## 2020-08-25 NOTE — Telephone Encounter (Signed)
Good Morning, Pt would like a call to reschedule her 9/1 appt with Dr. Theora Gianotti. She is having a conflict and needs to move the appt out. Please call pt at number attached to demographics. Thank you

## 2020-08-25 NOTE — Telephone Encounter (Signed)
Patient phoned on this date stating that she would not be able to attend her appt on 08-26-20 and wanted to reschedule. Appt moved to 09-16-20 and patient is aware.

## 2020-08-26 ENCOUNTER — Inpatient Hospital Stay: Payer: Medicaid Other

## 2020-08-28 ENCOUNTER — Inpatient Hospital Stay: Payer: Medicaid Other | Admitting: Internal Medicine

## 2020-08-28 ENCOUNTER — Telehealth: Payer: Self-pay | Admitting: *Deleted

## 2020-08-28 ENCOUNTER — Inpatient Hospital Stay: Payer: Medicaid Other

## 2020-08-28 ENCOUNTER — Inpatient Hospital Stay: Payer: Medicaid Other | Attending: Internal Medicine

## 2020-08-28 DIAGNOSIS — Z6841 Body Mass Index (BMI) 40.0 and over, adult: Secondary | ICD-10-CM | POA: Insufficient documentation

## 2020-08-28 DIAGNOSIS — J329 Chronic sinusitis, unspecified: Secondary | ICD-10-CM | POA: Insufficient documentation

## 2020-08-28 DIAGNOSIS — R5381 Other malaise: Secondary | ICD-10-CM | POA: Insufficient documentation

## 2020-08-28 DIAGNOSIS — I10 Essential (primary) hypertension: Secondary | ICD-10-CM | POA: Insufficient documentation

## 2020-08-28 DIAGNOSIS — D649 Anemia, unspecified: Secondary | ICD-10-CM | POA: Insufficient documentation

## 2020-08-28 DIAGNOSIS — C482 Malignant neoplasm of peritoneum, unspecified: Secondary | ICD-10-CM | POA: Insufficient documentation

## 2020-08-28 DIAGNOSIS — F17210 Nicotine dependence, cigarettes, uncomplicated: Secondary | ICD-10-CM | POA: Insufficient documentation

## 2020-08-28 DIAGNOSIS — L739 Follicular disorder, unspecified: Secondary | ICD-10-CM | POA: Insufficient documentation

## 2020-08-28 DIAGNOSIS — Z90722 Acquired absence of ovaries, bilateral: Secondary | ICD-10-CM | POA: Insufficient documentation

## 2020-08-28 DIAGNOSIS — R110 Nausea: Secondary | ICD-10-CM | POA: Insufficient documentation

## 2020-08-28 DIAGNOSIS — K219 Gastro-esophageal reflux disease without esophagitis: Secondary | ICD-10-CM | POA: Insufficient documentation

## 2020-08-28 DIAGNOSIS — Z86718 Personal history of other venous thrombosis and embolism: Secondary | ICD-10-CM | POA: Insufficient documentation

## 2020-08-28 DIAGNOSIS — Z85118 Personal history of other malignant neoplasm of bronchus and lung: Secondary | ICD-10-CM | POA: Insufficient documentation

## 2020-08-28 DIAGNOSIS — Z90710 Acquired absence of both cervix and uterus: Secondary | ICD-10-CM | POA: Insufficient documentation

## 2020-08-28 DIAGNOSIS — Z5111 Encounter for antineoplastic chemotherapy: Secondary | ICD-10-CM | POA: Insufficient documentation

## 2020-08-28 DIAGNOSIS — M1990 Unspecified osteoarthritis, unspecified site: Secondary | ICD-10-CM | POA: Insufficient documentation

## 2020-08-28 NOTE — Telephone Encounter (Signed)
Left vm for patient to return my phone call. She missed today's appointment for chemotherapy.
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
## 2020-08-28 NOTE — Assessment & Plan Note (Deleted)
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   # HOLD with Bev [+Lymparza]. Labs today reviewed- worsening anemia/headaches- see below  # Headaches- sec to Bev/? Zofran. If not better then MRI brain.   # worsening anemia sec to [place]. HOLD for above reasons.  Check iron studies ferritin LDH  # Nausea G-1; sec to Lymparza prn.   # PN G-2- on Lyrica 50 mg TID STABLE   # DISPOSITION:  # HOLD chemo today; de-access #Follow-up in 2 weeks-MD labs-CBC CMP CA 125; UA; avastin - Dr.B

## 2020-09-07 ENCOUNTER — Encounter: Payer: Self-pay | Admitting: Internal Medicine

## 2020-09-07 ENCOUNTER — Other Ambulatory Visit: Payer: Self-pay

## 2020-09-07 ENCOUNTER — Telehealth: Payer: Self-pay | Admitting: Nurse Practitioner

## 2020-09-07 NOTE — Telephone Encounter (Signed)
Writer was asked to phone patient and move patient's appt from 09-16-20 to 09-15-20. Writer phoned patient and moved appt to 09-15-20 at 1:30.

## 2020-09-08 ENCOUNTER — Encounter: Payer: Self-pay | Admitting: Internal Medicine

## 2020-09-08 ENCOUNTER — Inpatient Hospital Stay: Payer: Medicaid Other

## 2020-09-08 ENCOUNTER — Inpatient Hospital Stay (HOSPITAL_BASED_OUTPATIENT_CLINIC_OR_DEPARTMENT_OTHER): Payer: Medicaid Other | Admitting: Internal Medicine

## 2020-09-08 DIAGNOSIS — I10 Essential (primary) hypertension: Secondary | ICD-10-CM | POA: Diagnosis not present

## 2020-09-08 DIAGNOSIS — R5381 Other malaise: Secondary | ICD-10-CM | POA: Diagnosis not present

## 2020-09-08 DIAGNOSIS — C482 Malignant neoplasm of peritoneum, unspecified: Secondary | ICD-10-CM

## 2020-09-08 DIAGNOSIS — Z6841 Body Mass Index (BMI) 40.0 and over, adult: Secondary | ICD-10-CM | POA: Diagnosis not present

## 2020-09-08 DIAGNOSIS — F17210 Nicotine dependence, cigarettes, uncomplicated: Secondary | ICD-10-CM | POA: Diagnosis not present

## 2020-09-08 DIAGNOSIS — R110 Nausea: Secondary | ICD-10-CM | POA: Diagnosis not present

## 2020-09-08 DIAGNOSIS — Z90722 Acquired absence of ovaries, bilateral: Secondary | ICD-10-CM | POA: Diagnosis not present

## 2020-09-08 DIAGNOSIS — Z86718 Personal history of other venous thrombosis and embolism: Secondary | ICD-10-CM | POA: Diagnosis not present

## 2020-09-08 DIAGNOSIS — Z7189 Other specified counseling: Secondary | ICD-10-CM

## 2020-09-08 DIAGNOSIS — J329 Chronic sinusitis, unspecified: Secondary | ICD-10-CM | POA: Diagnosis not present

## 2020-09-08 DIAGNOSIS — Z85118 Personal history of other malignant neoplasm of bronchus and lung: Secondary | ICD-10-CM | POA: Diagnosis not present

## 2020-09-08 DIAGNOSIS — D649 Anemia, unspecified: Secondary | ICD-10-CM | POA: Diagnosis not present

## 2020-09-08 DIAGNOSIS — Z5111 Encounter for antineoplastic chemotherapy: Secondary | ICD-10-CM | POA: Diagnosis not present

## 2020-09-08 DIAGNOSIS — K219 Gastro-esophageal reflux disease without esophagitis: Secondary | ICD-10-CM | POA: Diagnosis not present

## 2020-09-08 DIAGNOSIS — Z90710 Acquired absence of both cervix and uterus: Secondary | ICD-10-CM | POA: Diagnosis not present

## 2020-09-08 DIAGNOSIS — M1990 Unspecified osteoarthritis, unspecified site: Secondary | ICD-10-CM | POA: Diagnosis not present

## 2020-09-08 DIAGNOSIS — L739 Follicular disorder, unspecified: Secondary | ICD-10-CM | POA: Diagnosis not present

## 2020-09-08 LAB — CBC WITH DIFFERENTIAL/PLATELET
Abs Immature Granulocytes: 0.01 10*3/uL (ref 0.00–0.07)
Basophils Absolute: 0 10*3/uL (ref 0.0–0.1)
Basophils Relative: 1 %
Eosinophils Absolute: 0.1 10*3/uL (ref 0.0–0.5)
Eosinophils Relative: 1 %
HCT: 33.5 % — ABNORMAL LOW (ref 36.0–46.0)
Hemoglobin: 11.1 g/dL — ABNORMAL LOW (ref 12.0–15.0)
Immature Granulocytes: 0 %
Lymphocytes Relative: 32 %
Lymphs Abs: 1.3 10*3/uL (ref 0.7–4.0)
MCH: 36.4 pg — ABNORMAL HIGH (ref 26.0–34.0)
MCHC: 33.1 g/dL (ref 30.0–36.0)
MCV: 109.8 fL — ABNORMAL HIGH (ref 80.0–100.0)
Monocytes Absolute: 0.5 10*3/uL (ref 0.1–1.0)
Monocytes Relative: 11 %
Neutro Abs: 2.3 10*3/uL (ref 1.7–7.7)
Neutrophils Relative %: 55 %
Platelets: 111 10*3/uL — ABNORMAL LOW (ref 150–400)
RBC: 3.05 MIL/uL — ABNORMAL LOW (ref 3.87–5.11)
RDW: 17.6 % — ABNORMAL HIGH (ref 11.5–15.5)
WBC: 4.2 10*3/uL (ref 4.0–10.5)
nRBC: 0 % (ref 0.0–0.2)

## 2020-09-08 LAB — COMPREHENSIVE METABOLIC PANEL
ALT: 16 U/L (ref 0–44)
AST: 18 U/L (ref 15–41)
Albumin: 3.7 g/dL (ref 3.5–5.0)
Alkaline Phosphatase: 54 U/L (ref 38–126)
Anion gap: 9 (ref 5–15)
BUN: 13 mg/dL (ref 6–20)
CO2: 26 mmol/L (ref 22–32)
Calcium: 9.1 mg/dL (ref 8.9–10.3)
Chloride: 104 mmol/L (ref 98–111)
Creatinine, Ser: 0.72 mg/dL (ref 0.44–1.00)
GFR calc Af Amer: >60 mL/min (ref >60)
GFR calc non Af Amer: >60 mL/min (ref >60)
Glucose, Bld: 145 mg/dL — ABNORMAL HIGH (ref 70–99)
Potassium: 3.9 mmol/L (ref 3.5–5.1)
Sodium: 139 mmol/L (ref 135–145)
Total Bilirubin: 0.7 mg/dL (ref 0.3–1.2)
Total Protein: 7.3 g/dL (ref 6.5–8.1)

## 2020-09-08 LAB — URINALYSIS, COMPLETE (UACMP) WITH MICROSCOPIC
Bacteria, UA: NONE SEEN
Bilirubin Urine: NEGATIVE
Glucose, UA: NEGATIVE mg/dL
Hgb urine dipstick: NEGATIVE
Ketones, ur: NEGATIVE mg/dL
Nitrite: NEGATIVE
Protein, ur: NEGATIVE mg/dL
Specific Gravity, Urine: 1.023 (ref 1.005–1.030)
pH: 5 (ref 5.0–8.0)

## 2020-09-08 MED ORDER — SODIUM CHLORIDE 0.9% FLUSH
10.0000 mL | Freq: Once | INTRAVENOUS | Status: AC
  Administered 2020-09-08: 10 mL via INTRAVENOUS
  Filled 2020-09-08: qty 10

## 2020-09-08 MED ORDER — HEPARIN SOD (PORK) LOCK FLUSH 100 UNIT/ML IV SOLN
500.0000 [IU] | Freq: Once | INTRAVENOUS | Status: AC
  Filled 2020-09-08: qty 5

## 2020-09-08 MED ORDER — DEXAMETHASONE SODIUM PHOSPHATE 10 MG/ML IJ SOLN
4.0000 mg | Freq: Once | INTRAMUSCULAR | Status: AC
  Administered 2020-09-08: 4 mg via INTRAVENOUS
  Filled 2020-09-08: qty 1

## 2020-09-08 MED ORDER — SODIUM CHLORIDE 0.9 % IV SOLN
Freq: Once | INTRAVENOUS | Status: AC
  Filled 2020-09-08: qty 250

## 2020-09-08 MED ORDER — HEPARIN SOD (PORK) LOCK FLUSH 100 UNIT/ML IV SOLN
INTRAVENOUS | Status: AC
  Filled 2020-09-08: qty 5

## 2020-09-08 MED ORDER — SODIUM CHLORIDE 0.9 % IV SOLN: 4.0000 mg | Freq: Once | INTRAVENOUS | Status: DC

## 2020-09-08 MED ORDER — SODIUM CHLORIDE 0.9 % IV SOLN
15.0000 mg/kg | Freq: Once | INTRAVENOUS | Status: AC
  Administered 2020-09-08: 1700 mg via INTRAVENOUS
  Filled 2020-09-08: qty 64

## 2020-09-08 MED ORDER — HEPARIN SOD (PORK) LOCK FLUSH 100 UNIT/ML IV SOLN
500.0000 [IU] | Freq: Once | INTRAVENOUS | Status: AC | PRN
  Administered 2020-09-08: 500 [IU]
  Filled 2020-09-08: qty 5
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
## 2020-09-08 NOTE — Assessment & Plan Note (Addendum)
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   # proceed with  Bev [+Lymparza]. Labs today reviewed;  acceptable for treatment today.   # Headaches- sec to Bev/? Zofran-improved- MRI brain-NEGATIVE.   # MILD anemia- Hb 11- improved aftre holding Lynzparza.   # Nausea G-1; sec to Lymparza prn.   # PN G-2- on Lyrica 50 mg TID; STABLE.   # DISPOSITION:  # chemo today;  #Follow-up in 3 weeks-MD labs-CBC CMP CA 125; UA; avastin - Dr.B

## 2020-09-09 LAB — CA 125: Cancer Antigen (CA) 125: 8.8 U/mL (ref 0.0–38.1)

## 2020-09-15 ENCOUNTER — Inpatient Hospital Stay (HOSPITAL_BASED_OUTPATIENT_CLINIC_OR_DEPARTMENT_OTHER): Payer: Medicaid Other | Admitting: Nurse Practitioner

## 2020-09-15 ENCOUNTER — Other Ambulatory Visit: Payer: Self-pay

## 2020-09-15 ENCOUNTER — Encounter: Payer: Self-pay | Admitting: Nurse Practitioner

## 2020-09-15 VITALS — BP 121/80 | HR 55 | Temp 97.8°F | Resp 20 | Wt 261.3 lb

## 2020-09-15 DIAGNOSIS — Z5111 Encounter for antineoplastic chemotherapy: Secondary | ICD-10-CM | POA: Diagnosis not present

## 2020-09-15 DIAGNOSIS — C801 Malignant (primary) neoplasm, unspecified: Secondary | ICD-10-CM | POA: Diagnosis not present

## 2020-09-15 MED ORDER — DOXYCYCLINE HYCLATE 100 MG PO TABS: 100.0000 mg | ORAL_TABLET | Freq: Two times a day (BID) | ORAL | 0 refills | Status: AC
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
## 2020-09-16 ENCOUNTER — Ambulatory Visit: Payer: Medicaid Other

## 2020-09-23 MED FILL — LYNPARZA 150 MG TABLET: 150 | 30 days supply | Qty: 120 | Fill #2

## 2020-09-29 ENCOUNTER — Other Ambulatory Visit: Payer: Self-pay

## 2020-09-29 ENCOUNTER — Encounter: Payer: Self-pay | Admitting: Internal Medicine

## 2020-09-29 ENCOUNTER — Inpatient Hospital Stay: Payer: Medicaid Other

## 2020-09-29 ENCOUNTER — Other Ambulatory Visit: Payer: Self-pay | Admitting: Internal Medicine

## 2020-09-29 ENCOUNTER — Inpatient Hospital Stay: Payer: Medicaid Other | Attending: Internal Medicine

## 2020-09-29 ENCOUNTER — Inpatient Hospital Stay (HOSPITAL_BASED_OUTPATIENT_CLINIC_OR_DEPARTMENT_OTHER): Payer: Medicaid Other | Admitting: Internal Medicine

## 2020-09-29 DIAGNOSIS — Z5112 Encounter for antineoplastic immunotherapy: Secondary | ICD-10-CM | POA: Insufficient documentation

## 2020-09-29 DIAGNOSIS — Z95828 Presence of other vascular implants and grafts: Secondary | ICD-10-CM

## 2020-09-29 DIAGNOSIS — C482 Malignant neoplasm of peritoneum, unspecified: Secondary | ICD-10-CM | POA: Diagnosis not present

## 2020-09-29 DIAGNOSIS — Z23 Encounter for immunization: Secondary | ICD-10-CM | POA: Insufficient documentation

## 2020-09-29 DIAGNOSIS — Z7189 Other specified counseling: Secondary | ICD-10-CM

## 2020-09-29 LAB — COMPREHENSIVE METABOLIC PANEL
ALT: 18 U/L (ref 0–44)
AST: 24 U/L (ref 15–41)
Albumin: 3.6 g/dL (ref 3.5–5.0)
Alkaline Phosphatase: 64 U/L (ref 38–126)
Anion gap: 11 (ref 5–15)
BUN: 17 mg/dL (ref 6–20)
CO2: 25 mmol/L (ref 22–32)
Calcium: 8.8 mg/dL — ABNORMAL LOW (ref 8.9–10.3)
Chloride: 103 mmol/L (ref 98–111)
Creatinine, Ser: 0.79 mg/dL (ref 0.44–1.00)
GFR calc non Af Amer: >60 mL/min (ref >60)
Glucose, Bld: 139 mg/dL — ABNORMAL HIGH (ref 70–99)
Potassium: 4 mmol/L (ref 3.5–5.1)
Sodium: 139 mmol/L (ref 135–145)
Total Bilirubin: 0.6 mg/dL (ref 0.3–1.2)
Total Protein: 7.4 g/dL (ref 6.5–8.1)

## 2020-09-29 LAB — URINALYSIS, COMPLETE (UACMP) WITH MICROSCOPIC
Bacteria, UA: NONE SEEN
Bilirubin Urine: NEGATIVE
Glucose, UA: NEGATIVE mg/dL
Hgb urine dipstick: NEGATIVE
Ketones, ur: NEGATIVE mg/dL
Leukocytes,Ua: NEGATIVE
Nitrite: NEGATIVE
Protein, ur: NEGATIVE mg/dL
Specific Gravity, Urine: 1.025 (ref 1.005–1.030)
pH: 5 (ref 5.0–8.0)

## 2020-09-29 LAB — CBC WITH DIFFERENTIAL/PLATELET
Abs Immature Granulocytes: 0.01 10*3/uL (ref 0.00–0.07)
Basophils Absolute: 0 10*3/uL (ref 0.0–0.1)
Basophils Relative: 1 %
Eosinophils Absolute: 0.1 10*3/uL (ref 0.0–0.5)
Eosinophils Relative: 2 %
HCT: 29.1 % — ABNORMAL LOW (ref 36.0–46.0)
Hemoglobin: 10.1 g/dL — ABNORMAL LOW (ref 12.0–15.0)
Immature Granulocytes: 0 %
Lymphocytes Relative: 37 %
Lymphs Abs: 1.6 10*3/uL (ref 0.7–4.0)
MCH: 37.7 pg — ABNORMAL HIGH (ref 26.0–34.0)
MCHC: 34.7 g/dL (ref 30.0–36.0)
MCV: 108.6 fL — ABNORMAL HIGH (ref 80.0–100.0)
Monocytes Absolute: 0.3 10*3/uL (ref 0.1–1.0)
Monocytes Relative: 7 %
Neutro Abs: 2.3 10*3/uL (ref 1.7–7.7)
Neutrophils Relative %: 53 %
Platelets: 97 10*3/uL — ABNORMAL LOW (ref 150–400)
RBC: 2.68 MIL/uL — ABNORMAL LOW (ref 3.87–5.11)
RDW: 14.8 % (ref 11.5–15.5)
Smear Review: DECREASED
WBC: 4.4 10*3/uL (ref 4.0–10.5)
nRBC: 0 % (ref 0.0–0.2)

## 2020-09-29 MED ORDER — SODIUM CHLORIDE 0.9 % IV SOLN
15.0000 mg/kg | Freq: Once | INTRAVENOUS | Status: AC
  Administered 2020-09-29: 1700 mg via INTRAVENOUS
  Filled 2020-09-29: qty 64

## 2020-09-29 MED ORDER — HEPARIN SOD (PORK) LOCK FLUSH 100 UNIT/ML IV SOLN
500.0000 [IU] | Freq: Once | INTRAVENOUS | Status: AC | PRN
  Administered 2020-09-29: 500 [IU]
  Filled 2020-09-29: qty 5

## 2020-09-29 MED ORDER — SODIUM CHLORIDE 0.9 % IV SOLN: 4.0000 mg | Freq: Once | INTRAVENOUS | Status: DC

## 2020-09-29 MED ORDER — INFLUENZA VAC SPLIT QUAD 0.5 ML IM SUSY
0.5000 mL | PREFILLED_SYRINGE | Freq: Once | INTRAMUSCULAR | Status: AC
  Administered 2020-09-29: 0.5 mL via INTRAMUSCULAR
  Filled 2020-09-29: qty 0.5

## 2020-09-29 MED ORDER — SODIUM CHLORIDE 0.9% FLUSH
10.0000 mL | Freq: Once | INTRAVENOUS | Status: AC
  Administered 2020-09-29: 10 mL via INTRAVENOUS
  Filled 2020-09-29: qty 10

## 2020-09-29 MED ORDER — DEXAMETHASONE SODIUM PHOSPHATE 10 MG/ML IJ SOLN
4.0000 mg | Freq: Once | INTRAMUSCULAR | Status: AC
  Administered 2020-09-29: 4 mg via INTRAVENOUS
  Filled 2020-09-29: qty 1

## 2020-09-29 MED ORDER — HEPARIN SOD (PORK) LOCK FLUSH 100 UNIT/ML IV SOLN
INTRAVENOUS | Status: AC
  Filled 2020-09-29: qty 5

## 2020-09-29 MED ORDER — SODIUM CHLORIDE 0.9 % IV SOLN
Freq: Once | INTRAVENOUS | Status: AC
  Filled 2020-09-29: qty 250

## 2020-09-29 NOTE — Assessment & Plan Note (Addendum)
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 STABLE.   # proceed with  Bev [+Lymparza]. Labs today reviewed;  acceptable for treatment today- platelets- 97.   # Headaches- sec to Bev- STABLE.   # MILD anemia- Hb 10-sec to Lynparza STABLE.  # Nausea G-1; STABLE;  sec to Lymparza prn.   # PN G-2- on Lyrica 50 mg TID; STABLE.   #   # DISPOSITION:  # flu shot # chemo today;  #Follow-up in 3 weeks-MD labs-CBC CMP CA 125; UA; avastin - Dr.B

## 2020-09-29 NOTE — Progress Notes (Signed)
Per Lennox Grumbles RN per Dr. Rogue Bussing okay to proceed with treatment prior to remainder of CBC results ([place]).
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
## 2020-09-30 ENCOUNTER — Encounter: Payer: Self-pay | Admitting: Nurse Practitioner

## 2020-09-30 ENCOUNTER — Encounter: Payer: Self-pay | Admitting: Family Medicine

## 2020-09-30 ENCOUNTER — Other Ambulatory Visit: Payer: Self-pay | Admitting: Nurse Practitioner

## 2020-09-30 DIAGNOSIS — C482 Malignant neoplasm of peritoneum, unspecified: Secondary | ICD-10-CM

## 2020-09-30 DIAGNOSIS — Z1501 Genetic susceptibility to malignant neoplasm of breast: Secondary | ICD-10-CM

## 2020-09-30 LAB — CA 125: Cancer Antigen (CA) 125: 7.7 U/mL (ref 0.0–38.1)

## 2020-09-30 MED ORDER — CLONIDINE 0.2 MG/24HR TD PTWK: 0.2000 mg | MEDICATED_PATCH | TRANSDERMAL | 3 refills | Status: DC

## 2020-10-01 ENCOUNTER — Other Ambulatory Visit: Payer: Self-pay | Admitting: Hospice and Palliative Medicine

## 2020-10-01 MED ORDER — OXYCODONE HCL 5 MG PO TABS: 5.0000 mg | ORAL_TABLET | Freq: Three times a day (TID) | ORAL | 0 refills | Status: DC | PRN

## 2020-10-01 NOTE — Progress Notes (Signed)
Patient requested refill of oxycodone. Last refilled in 07/2020. PDMP reviewed.

## 2020-10-05 ENCOUNTER — Ambulatory Visit: Payer: Medicaid Other | Admitting: Cardiology

## 2020-10-06 ENCOUNTER — Encounter: Payer: Self-pay | Admitting: Cardiology

## 2020-10-20 ENCOUNTER — Inpatient Hospital Stay: Payer: Medicaid Other

## 2020-10-20 ENCOUNTER — Other Ambulatory Visit: Payer: Self-pay | Admitting: *Deleted

## 2020-10-20 ENCOUNTER — Inpatient Hospital Stay (HOSPITAL_BASED_OUTPATIENT_CLINIC_OR_DEPARTMENT_OTHER): Payer: Medicaid Other | Admitting: Internal Medicine

## 2020-10-20 ENCOUNTER — Other Ambulatory Visit: Payer: Self-pay

## 2020-10-20 ENCOUNTER — Encounter: Payer: Self-pay | Admitting: Internal Medicine

## 2020-10-20 VITALS — BP 99/68 | HR 71 | Resp 18

## 2020-10-20 DIAGNOSIS — C482 Malignant neoplasm of peritoneum, unspecified: Secondary | ICD-10-CM

## 2020-10-20 DIAGNOSIS — Z7189 Other specified counseling: Secondary | ICD-10-CM

## 2020-10-20 DIAGNOSIS — Z5112 Encounter for antineoplastic immunotherapy: Secondary | ICD-10-CM | POA: Diagnosis not present

## 2020-10-20 LAB — URINALYSIS, COMPLETE (UACMP) WITH MICROSCOPIC
Bacteria, UA: NONE SEEN
Bilirubin Urine: NEGATIVE
Glucose, UA: NEGATIVE mg/dL
Hgb urine dipstick: NEGATIVE
Ketones, ur: NEGATIVE mg/dL
Leukocytes,Ua: NEGATIVE
Nitrite: NEGATIVE
Protein, ur: 30 mg/dL — AB
Specific Gravity, Urine: 1.028 (ref 1.005–1.030)
pH: 5 (ref 5.0–8.0)

## 2020-10-20 LAB — COMPREHENSIVE METABOLIC PANEL
ALT: 16 U/L (ref 0–44)
AST: 21 U/L (ref 15–41)
Albumin: 3.4 g/dL — ABNORMAL LOW (ref 3.5–5.0)
Alkaline Phosphatase: 60 U/L (ref 38–126)
Anion gap: 8 (ref 5–15)
BUN: 14 mg/dL (ref 6–20)
CO2: 23 mmol/L (ref 22–32)
Calcium: 8.2 mg/dL — ABNORMAL LOW (ref 8.9–10.3)
Chloride: 107 mmol/L (ref 98–111)
Creatinine, Ser: 0.67 mg/dL (ref 0.44–1.00)
GFR, Estimated: >60 mL/min (ref >60)
Glucose, Bld: 134 mg/dL — ABNORMAL HIGH (ref 70–99)
Potassium: 3.7 mmol/L (ref 3.5–5.1)
Sodium: 138 mmol/L (ref 135–145)
Total Bilirubin: 0.4 mg/dL (ref 0.3–1.2)
Total Protein: 6.7 g/dL (ref 6.5–8.1)

## 2020-10-20 LAB — CBC WITH DIFFERENTIAL/PLATELET
Abs Immature Granulocytes: 0.01 10*3/uL (ref 0.00–0.07)
Basophils Absolute: 0 10*3/uL (ref 0.0–0.1)
Basophils Relative: 1 %
Eosinophils Absolute: 0.1 10*3/uL (ref 0.0–0.5)
Eosinophils Relative: 3 %
HCT: 21.3 % — ABNORMAL LOW (ref 36.0–46.0)
Hemoglobin: 7.4 g/dL — ABNORMAL LOW (ref 12.0–15.0)
Immature Granulocytes: 0 %
Lymphocytes Relative: 34 %
Lymphs Abs: 1.6 10*3/uL (ref 0.7–4.0)
MCH: 37 pg — ABNORMAL HIGH (ref 26.0–34.0)
MCHC: 34.7 g/dL (ref 30.0–36.0)
MCV: 106.5 fL — ABNORMAL HIGH (ref 80.0–100.0)
Monocytes Absolute: 0.4 10*3/uL (ref 0.1–1.0)
Monocytes Relative: 8 %
Neutro Abs: 2.6 10*3/uL (ref 1.7–7.7)
Neutrophils Relative %: 54 %
Platelets: 109 10*3/uL — ABNORMAL LOW (ref 150–400)
RBC: 2 MIL/uL — ABNORMAL LOW (ref 3.87–5.11)
RDW: 14.7 % (ref 11.5–15.5)
Smear Review: NORMAL
WBC: 4.8 10*3/uL (ref 4.0–10.5)
nRBC: 0 % (ref 0.0–0.2)

## 2020-10-20 MED ORDER — SODIUM CHLORIDE 0.9% FLUSH
10.0000 mL | Freq: Once | INTRAVENOUS | Status: AC
  Administered 2020-10-20: 10 mL via INTRAVENOUS
  Filled 2020-10-20: qty 10

## 2020-10-20 MED ORDER — OLAPARIB 100 MG PO TABS: ORAL_TABLET | ORAL | 3 refills | Status: DC

## 2020-10-20 MED ORDER — SODIUM CHLORIDE 0.9 % IV SOLN: 4.0000 mg | Freq: Once | INTRAVENOUS | Status: DC

## 2020-10-20 MED ORDER — DEXAMETHASONE SODIUM PHOSPHATE 10 MG/ML IJ SOLN
4.0000 mg | Freq: Once | INTRAMUSCULAR | Status: AC
  Administered 2020-10-20: 4 mg via INTRAVENOUS
  Filled 2020-10-20: qty 1

## 2020-10-20 MED ORDER — HEPARIN SOD (PORK) LOCK FLUSH 100 UNIT/ML IV SOLN
500.0000 [IU] | Freq: Once | INTRAVENOUS | Status: AC
  Administered 2020-10-20: 500 [IU] via INTRAVENOUS
  Filled 2020-10-20: qty 5

## 2020-10-20 MED ORDER — HEPARIN SOD (PORK) LOCK FLUSH 100 UNIT/ML IV SOLN
INTRAVENOUS | Status: AC
  Filled 2020-10-20: qty 5

## 2020-10-20 MED ORDER — SODIUM CHLORIDE 0.9 % IV SOLN
Freq: Once | INTRAVENOUS | Status: AC
  Filled 2020-10-20: qty 250

## 2020-10-20 MED ORDER — OLAPARIB 150 MG PO TABS
ORAL_TABLET | ORAL | 3 refills | Status: DC
Start: 2020-10-20 — End: 2020-10-20

## 2020-10-20 MED ORDER — SODIUM CHLORIDE 0.9 % IV SOLN
15.0000 mg/kg | Freq: Once | INTRAVENOUS | Status: AC
  Administered 2020-10-20: 1700 mg via INTRAVENOUS
  Filled 2020-10-20: qty 64

## 2020-10-20 NOTE — Progress Notes (Signed)
Per Peyton Najjar, CMA, per Dr. Rogue Bussing, okay to proceed with treatment despite HGB being 7.4.

## 2020-10-20 NOTE — Patient Instructions (Signed)
STOP LYNPARZA UNTIL FURTHER EVALUATION.
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
## 2020-10-20 NOTE — Progress Notes (Signed)
Pt in for follow up, reports ongoing pain in lower legs, relieved by pain medication.

## 2020-10-20 NOTE — Assessment & Plan Note (Addendum)
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  Stable.  Repeat tumor markers.   # proceed with  Bev [HOLD Lymparza-see below]. Labs today reviewed;  acceptable for treatment today- platelets- 103.  # Severe recurrent anemia- Hb 7.5; worse; symptomatic; HOLDsec to [place] will re-start Lynparza at lower dose- 250 mg BID at next visit.  Discussed with pharmacy.  New prescription sent.  # Headaches- sec to Bev- STABLE.   # Nausea G-1; STABLE;  sec to Lymparza prn.   # PN G-2- on Lyrica 50 mg TID; stable.  # DISPOSITION:  # chemo today;  #Follow-up in 10 days; -MD labs-CBC CMP; HOLD tube- NO avastin- Dr.B

## 2020-10-21 LAB — CA 125: Cancer Antigen (CA) 125: 7.6 U/mL (ref 0.0–38.1)

## 2020-10-28 ENCOUNTER — Telehealth: Payer: Self-pay | Admitting: Hospice and Palliative Medicine

## 2020-10-28 ENCOUNTER — Other Ambulatory Visit: Payer: Self-pay | Admitting: Hospice and Palliative Medicine

## 2020-10-28 MED ORDER — OXYCODONE HCL 5 MG PO TABS
5.0000 mg | ORAL_TABLET | Freq: Three times a day (TID) | ORAL | 0 refills | Status: DC | PRN
Start: 2020-10-28 — End: 2020-10-28

## 2020-10-28 MED ORDER — OXYCODONE HCL 5 MG PO TABS
5.0000 mg | ORAL_TABLET | Freq: Three times a day (TID) | ORAL | 0 refills | Status: DC | PRN
Start: 2020-10-28 — End: 2020-11-25

## 2020-10-28 MED FILL — LYNPARZA 150 MG TABLET: 150 | 30 days supply | Qty: 60 | Fill #0

## 2020-10-28 MED FILL — LYNPARZA 100 MG TAB: 100 | 30 days supply | Qty: 60 | Fill #0

## 2020-10-28 NOTE — Telephone Encounter (Signed)
Pt called and ell is requesting a refill on her pain medication.

## 2020-10-28 NOTE — Telephone Encounter (Signed)
PDMP reviewed. Rx sent to pharmacy.

## 2020-10-30 ENCOUNTER — Inpatient Hospital Stay: Payer: Medicaid Other | Attending: Internal Medicine

## 2020-10-30 ENCOUNTER — Inpatient Hospital Stay: Payer: Medicaid Other | Admitting: Internal Medicine

## 2020-10-30 DIAGNOSIS — C482 Malignant neoplasm of peritoneum, unspecified: Secondary | ICD-10-CM | POA: Insufficient documentation

## 2020-10-30 DIAGNOSIS — Z5112 Encounter for antineoplastic immunotherapy: Secondary | ICD-10-CM | POA: Insufficient documentation

## 2020-11-02 ENCOUNTER — Encounter: Payer: Self-pay | Admitting: Internal Medicine

## 2020-11-06 ENCOUNTER — Ambulatory Visit: Admission: RE | Admit: 2020-11-06 | Payer: Medicaid Other | Source: Ambulatory Visit

## 2020-11-10 ENCOUNTER — Encounter: Payer: Self-pay | Admitting: Family Medicine

## 2020-11-12 ENCOUNTER — Inpatient Hospital Stay: Payer: Medicaid Other

## 2020-11-12 ENCOUNTER — Telehealth: Payer: Self-pay | Admitting: *Deleted

## 2020-11-12 ENCOUNTER — Other Ambulatory Visit: Payer: Self-pay

## 2020-11-12 ENCOUNTER — Encounter: Payer: Self-pay | Admitting: Internal Medicine

## 2020-11-12 ENCOUNTER — Inpatient Hospital Stay (HOSPITAL_BASED_OUTPATIENT_CLINIC_OR_DEPARTMENT_OTHER): Payer: Medicaid Other | Admitting: Internal Medicine

## 2020-11-12 DIAGNOSIS — Z7189 Other specified counseling: Secondary | ICD-10-CM

## 2020-11-12 DIAGNOSIS — C482 Malignant neoplasm of peritoneum, unspecified: Secondary | ICD-10-CM | POA: Diagnosis not present

## 2020-11-12 DIAGNOSIS — Z5112 Encounter for antineoplastic immunotherapy: Secondary | ICD-10-CM | POA: Diagnosis not present

## 2020-11-12 DIAGNOSIS — M25561 Pain in right knee: Secondary | ICD-10-CM

## 2020-11-12 LAB — CBC WITH DIFFERENTIAL/PLATELET
Abs Immature Granulocytes: 0.01 10*3/uL (ref 0.00–0.07)
Basophils Absolute: 0 10*3/uL (ref 0.0–0.1)
Basophils Relative: 1 %
Eosinophils Absolute: 0.1 10*3/uL (ref 0.0–0.5)
Eosinophils Relative: 3 %
HCT: 31.3 % — ABNORMAL LOW (ref 36.0–46.0)
Hemoglobin: 9.9 g/dL — ABNORMAL LOW (ref 12.0–15.0)
Immature Granulocytes: 0 %
Lymphocytes Relative: 32 %
Lymphs Abs: 1.3 10*3/uL (ref 0.7–4.0)
MCH: 34.9 pg — ABNORMAL HIGH (ref 26.0–34.0)
MCHC: 31.6 g/dL (ref 30.0–36.0)
MCV: 110.2 fL — ABNORMAL HIGH (ref 80.0–100.0)
Monocytes Absolute: 0.3 10*3/uL (ref 0.1–1.0)
Monocytes Relative: 8 %
Neutro Abs: 2.3 10*3/uL (ref 1.7–7.7)
Neutrophils Relative %: 56 %
Platelets: 110 10*3/uL — ABNORMAL LOW (ref 150–400)
RBC: 2.84 MIL/uL — ABNORMAL LOW (ref 3.87–5.11)
RDW: 17.2 % — ABNORMAL HIGH (ref 11.5–15.5)
WBC: 4.2 10*3/uL (ref 4.0–10.5)
nRBC: 0 % (ref 0.0–0.2)

## 2020-11-12 LAB — COMPREHENSIVE METABOLIC PANEL
ALT: 17 U/L (ref 0–44)
AST: 22 U/L (ref 15–41)
Albumin: 3.7 g/dL (ref 3.5–5.0)
Alkaline Phosphatase: 60 U/L (ref 38–126)
Anion gap: 10 (ref 5–15)
BUN: 14 mg/dL (ref 6–20)
CO2: 25 mmol/L (ref 22–32)
Calcium: 8.9 mg/dL (ref 8.9–10.3)
Chloride: 101 mmol/L (ref 98–111)
Creatinine, Ser: 0.57 mg/dL (ref 0.44–1.00)
GFR, Estimated: >60 mL/min (ref >60)
Glucose, Bld: 156 mg/dL — ABNORMAL HIGH (ref 70–99)
Potassium: 3.5 mmol/L (ref 3.5–5.1)
Sodium: 136 mmol/L (ref 135–145)
Total Bilirubin: 0.6 mg/dL (ref 0.3–1.2)
Total Protein: 7.2 g/dL (ref 6.5–8.1)

## 2020-11-12 LAB — URINALYSIS, COMPLETE (UACMP) WITH MICROSCOPIC
Bilirubin Urine: NEGATIVE
Glucose, UA: NEGATIVE mg/dL
Hgb urine dipstick: NEGATIVE
Ketones, ur: NEGATIVE mg/dL
Nitrite: NEGATIVE
Protein, ur: NEGATIVE mg/dL
Specific Gravity, Urine: 1.026 (ref 1.005–1.030)
pH: 5 (ref 5.0–8.0)

## 2020-11-12 MED ORDER — SODIUM CHLORIDE 0.9 % IV SOLN
Freq: Once | INTRAVENOUS | Status: AC
  Filled 2020-11-12: qty 250

## 2020-11-12 MED ORDER — SODIUM CHLORIDE 0.9 % IV SOLN: 4.0000 mg | Freq: Once | INTRAVENOUS | Status: DC

## 2020-11-12 MED ORDER — SODIUM CHLORIDE 0.9 % IV SOLN
15.0000 mg/kg | Freq: Once | INTRAVENOUS | Status: AC
  Administered 2020-11-12: 1800 mg via INTRAVENOUS
  Filled 2020-11-12: qty 64

## 2020-11-12 MED ORDER — SODIUM CHLORIDE 0.9% FLUSH
10.0000 mL | Freq: Once | INTRAVENOUS | Status: AC
  Administered 2020-11-12: 10 mL via INTRAVENOUS
  Filled 2020-11-12: qty 10

## 2020-11-12 MED ORDER — HEPARIN SOD (PORK) LOCK FLUSH 100 UNIT/ML IV SOLN
INTRAVENOUS | Status: AC
  Filled 2020-11-12: qty 5

## 2020-11-12 MED ORDER — SODIUM CHLORIDE 0.9 % IV SOLN: 15.0000 mg/kg | Freq: Once | INTRAVENOUS | Status: DC

## 2020-11-12 MED ORDER — HEPARIN SOD (PORK) LOCK FLUSH 100 UNIT/ML IV SOLN
500.0000 [IU] | Freq: Once | INTRAVENOUS | Status: AC
  Administered 2020-11-12: 500 [IU] via INTRAVENOUS
  Filled 2020-11-12: qty 5

## 2020-11-12 MED ORDER — DEXAMETHASONE SODIUM PHOSPHATE 10 MG/ML IJ SOLN
4.0000 mg | Freq: Once | INTRAMUSCULAR | Status: AC
  Administered 2020-11-12: 4 mg via INTRAVENOUS
  Filled 2020-11-12: qty 1
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
## 2020-11-12 NOTE — Telephone Encounter (Signed)
[facility] called stating patient was told to call for an appointment which is scheduled for 12/1 and is asking if we wat to enter a referral in [facility] as they are on Epic as well

## 2020-11-12 NOTE — Assessment & Plan Note (Addendum)
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  STABLE; tumor markers- improving.   # proceed with  Bev [HOLD Lymparza-see below]. Labs today reviewed;  acceptable for treatment today- platelets-103.  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  We will make a referral to orthopedics.  #Abdominal swelling-ventral hernia; as per patient intermittent abdominal discomfort especially with coughing.  Recommend abdominal binder.  If not improved/if still concerned-defer to gynecology oncology appointment in December.  # DISPOSITION:  #  asvatin today #Follow-up in 3 weeks- days; -MD labs-CBC CMP; ca-125; UA; Avastin- Dr.B

## 2020-11-12 NOTE — Telephone Encounter (Signed)
Referral sent via EPIC

## 2020-11-12 NOTE — Telephone Encounter (Signed)
Please send a referral to [facility] re: right knee pain. Thanks GB

## 2020-11-13 ENCOUNTER — Encounter: Payer: Self-pay | Admitting: Family Medicine

## 2020-11-13 LAB — CA 125: Cancer Antigen (CA) 125: 7.5 U/mL (ref 0.0–38.1)

## 2020-11-23 ENCOUNTER — Other Ambulatory Visit: Payer: Self-pay | Admitting: Family Medicine

## 2020-11-23 DIAGNOSIS — G43109 Migraine with aura, not intractable, without status migrainosus: Secondary | ICD-10-CM

## 2020-11-23 DIAGNOSIS — R000 Tachycardia, unspecified: Secondary | ICD-10-CM

## 2020-11-23 NOTE — Progress Notes (Deleted)
Name: [name]   MRN: [id number]    DOB: 06-07-1971   Date:11/23/2020       Progress Note  Subjective  Chief Complaint  Follow up   HPI HTN/Tachycardia: currently on metoprolol 12.5 mg but down to one daily, we will change to metoprolol ER 25 mg , heart rate still a little elevated, very seldom has palpitation   Morbid obesity: she lost weight during chemo, but she has gained it back, discussed healthy diet, appetite is much better.   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 Alto Denver. She is having some spotting, night sweats are severe, on Clonidine 0.2 mg , no side effects, but bp towards low end of normal, we will add clonidine 0.1 mg qhs and monitor for now. 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 Unable to play with grandkids because she has no energy secondary to chemo. She is home all day - on long term disability - she feels lonely and too much time on her hands. Realizing that only has a hand full of friends.   *** Patient Active Problem List   Diagnosis Date Noted  . 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 Arm vein blood clot, right 03/11/2020  . Malignant ascites 12/22/2019  . Acute dyspnea 12/22/2019  . Pleural effusion on  right 12/22/2019  . Tachycardia 12/22/2019  . Primary peritoneal carcinomatosis ([place]) 12/16/2019  . Goals of care, counseling/discussion 12/16/2019  . Serous adenocarcinoma ([place]) 12/11/2019  . Erythrocytosis 11/12/2019  . Morbid obesity with BMI of 40.0-44.9, adult ([place]) 07/07/2018  . 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 Fever blister 05/17/2018  . Family history of breast cancer 05/08/2018  . Family history of colon cancer in mother 05/08/2018  . Migraine with aura and without status migrainosus 04/18/2018  . Dysmenorrhea 06/21/2017  . Menorrhagia 06/21/2017  . Mild recurrent major depression ([place]) 01/20/2016  . Esophageal reflux 01/20/2016  . Osteoarthritis, multiple sites 01/20/2016  . Frequent headaches 01/20/2016    Past Surgical History:  Procedure Laterality Date  . ABDOMINAL HYSTERECTOMY  03/2020  . APPENDECTOMY     LSC but "ruptured when they did the surgery"  . CESAREAN SECTION    . CYSTOSCOPY N/A 04/08/2020   Procedure: CYSTOSCOPY;  [name], MD;  [name];  Service: Gynecology;  [name];  . IR THORACENTESIS ASP PLEURAL SPACE W/IMG GUIDE  12/06/2019  . IUD REMOVAL N/A 04/08/2020   Procedure: INTRAUTERINE DEVICE (IUD) REMOVAL;  [name], MD;  [name];  Service: Gynecology;  [name];  . PARACENTESIS     x6  . PORTA CATH INSERTION N/A 04/23/2020   Procedure: PORTA CATH INSERTION;  [name], MD;  Location: [facility]  INVASIVE CV LAB;  Service: Cardiovascular;  [name];  . TUBAL LIGATION     at time of CSxn  . WRIST SURGERY Left 11/21/2016   plates and screws inserted    Family History  Adopted: Yes  Problem Relation Age of Onset  . Lung cancer Father        deceased 34  . Breast cancer Mother 16       currently 57  . Colon cancer Mother   . ADD / ADHD Son   . ADD / ADHD Son   . Early death Maternal Aunt   . Breast cancer Maternal Aunt 34       deceased 74  . Breast  cancer Maternal Grandmother   . Depression Daughter   . Depression Daughter   . Prostate cancer Paternal Uncle   . Stroke Paternal Uncle   . Leukemia Paternal Aunt   . Breast cancer Paternal Grandmother   . Cancer Maternal Uncle     Social History   Tobacco Use  . Smoking status: Current Every Day Smoker    Packs/day: 0.50    Years: 30.00    Pack years: 15.00    Types: Cigarettes  . Smokeless tobacco: Never Used  Substance Use Topics  . Alcohol use: Not Currently    Alcohol/week: 0.0 standard drinks     Current Outpatient Medications:  .  ALPRAZolam (XANAX) 0.25 MG tablet, Take 1 tablet (0.25 mg total) by mouth 2 (two) times daily as needed for anxiety. Please do not drive while on the medication- as this can cause dizziness., Disp: 30 tablet, Rfl: 0 .  B Complex-C (B-COMPLEX WITH VITAMIN C) tablet, Take 1 tablet by mouth daily., Disp: , Rfl:  .  Biotin w/ Vitamins C & E (HAIR/SKIN/NAILS PO), Take 1 tablet by mouth daily., Disp: , Rfl:  .  cloNIDine (CATAPRES) 0.1 MG tablet, Take 1 tablet (0.1 mg total) by mouth at bedtime., Disp: 90 tablet, Rfl: 1 .  cloNIDine (CATAPRES-TTS-2) 0.2 mg/24hr patch, Place 1 patch (0.2 mg total) onto the skin once a week., Disp: 12 patch, Rfl: 3 .  DULoxetine (CYMBALTA) 60 MG capsule, Take 1 capsule (60 mg total) by mouth at bedtime., Disp: 90 capsule, Rfl: 1 .  lidocaine-prilocaine (EMLA) cream, Apply 1 application topically as needed., Disp: 30 g, Rfl: 3 .  loratadine (CLARITIN) 10 MG tablet, Take 1 tablet (10 mg total) by mouth every morning., Disp: 30 tablet, Rfl: 3 .  Magnesium 400 MG CAPS, , Disp: , Rfl:  .  metoprolol succinate (TOPROL-XL) 25 MG 24 hr tablet, Take 0.5 tablets (12.5 mg total) by mouth daily., Disp: 45 tablet, Rfl: 1 .  Multiple Vitamin (MULTIVITAMIN WITH MINERALS) TABS tablet, Take 1 tablet by mouth daily., Disp: , Rfl:  .  nicotine (NICODERM CQ - DOSED IN MG/24 HOURS) 14 mg/24hr patch, Place 1 patch (14 mg total) onto the skin  daily. (Patient not taking: Reported on 10/20/2020), Disp: 28 patch, Rfl: 0 .  olaparib (LYNPARZA) 100 MG tablet, Take 1 pill twice a day [along with 110m pill- Total 250 mg twice a day]. Swallow whole. May take with food to decrease nausea and vomiting. DO NOT START UNTIL EVALUATED BY MD., Disp: 60 tablet, Rfl: 3 .  olaparib (LYNPARZA) 150 MG tablet, Take 1 pill twice a day [along with 100 mg pill- Total 250 mg twice a day]. Swallow whole. May take with food to decrease nausea and vomiting. DO NOT START UNTIL EVALUATED BY MD., Disp:  60 tablet, Rfl: 3 .  omeprazole (PRILOSEC) 20 MG capsule, Take 1 capsule (20 mg total) by mouth daily., Disp: 90 capsule, Rfl: 1 .  ondansetron (ZOFRAN) 8 MG tablet, One pill every 8 hours as needed for nausea/vomitting., Disp: 40 tablet, Rfl: 1 .  oxyCODONE (OXY IR/ROXICODONE) 5 MG immediate release tablet, Take 1-2 tablets (5-10 mg total) by mouth every 8 (eight) hours as needed for severe pain., Disp: 60 tablet, Rfl: 0 .  pregabalin (LYRICA) 50 MG capsule, Take 1 capsule (50 mg total) by mouth 3 (three) times daily., Disp: 90 capsule, Rfl: 2 .  prochlorperazine (COMPAZINE) 10 MG tablet, Take 1 tablet (10 mg total) by mouth every 6 (six) hours as needed for nausea or vomiting., Disp: 40 tablet, Rfl: 1 .  sennosides-docusate sodium (SENOKOT-S) 8.6-50 MG tablet, Take 1 tablet by mouth at bedtime., Disp: , Rfl:  .  SUMAtriptan (IMITREX) 100 MG tablet, Take 1 tablet (100 mg total) by mouth every 2 (two) hours as needed for migraine. May repeat in 2 hours if headache persists or recurs., Disp: 10 tablet, Rfl: 0 .  traZODone (DESYREL) 50 MG tablet, Take 0.5-1 tablets (25-50 mg total) by mouth at bedtime as needed for sleep., Disp: 30 tablet, Rfl: 2 .  valACYclovir (VALTREX) 1000 MG tablet, Take 1 tablet (1,000 mg total) by mouth 2 (two) times daily as needed (outbreak). (Patient not taking: Reported on 10/20/2020), Disp: 6 tablet, Rfl: 0 No current facility-administered  medications for this visit.  Facility-Administered Medications Ordered in Other Visits:  .  sodium chloride flush (NS) 0.9 % injection 10 mL, 10 mL, Intravenous, Once, Brahmanday, Elisha Headland, MD  No Known Allergies  I personally reviewed {Reviewed:14835} with the patient/caregiver today.   ROS  ***  Objective  There were no vitals filed for this visit.  There is no height or weight on file to calculate BMI.  Physical Exam ***  Recent Results (from the past 2160 hour(s))  Urinalysis, Complete w Microscopic     Status: Abnormal   Collection Time: 09/08/20  9:16 AM  Result Value Ref Range   Color, Urine YELLOW (A) YELLOW   APPearance CLOUDY (A) CLEAR   Specific Gravity, Urine 1.023 1.005 - 1.030   pH 5.0 5.0 - 8.0   Glucose, UA NEGATIVE NEGATIVE mg/dL   Hgb urine dipstick NEGATIVE NEGATIVE   Bilirubin Urine NEGATIVE NEGATIVE   Ketones, ur NEGATIVE NEGATIVE mg/dL   Protein, ur NEGATIVE NEGATIVE mg/dL   Nitrite NEGATIVE NEGATIVE   Leukocytes,Ua TRACE (A) NEGATIVE   RBC / HPF 6-10 0 - 5 RBC/hpf   WBC, UA 0-5 0 - 5 WBC/hpf   Bacteria, UA NONE SEEN NONE SEEN   Squamous Epithelial / LPF 11-20 0 - 5   Mucus PRESENT    Hyaline Casts, UA PRESENT     Comment: Performed at [facility], [address] [place], [place] [address]  CA 125     Status: None   Collection Time: 09/08/20  9:16 AM  Result Value Ref Range   Cancer Antigen (CA) 125 8.8 0.0 - 38.1 U/mL    Comment: (NOTE) Roche Diagnostics Electrochemiluminescence Immunoassay (ECLIA) Values obtained with different assay methods or kits cannot be used interchangeably.  Results cannot be interpreted as absolute evidence of the presence or absence of malignant disease. Performed At: [facility] [address] [place], [place] [id number] Rush Farmer MD OT:[id number]   CBC with Differential     Status: Abnormal   Collection Time:  09/08/20  9:16 AM  Result Value Ref Range   WBC 4.2 4.0 - 10.5 K/uL    RBC 3.05 (L) 3.87 - 5.11 MIL/uL   Hemoglobin 11.1 (L) 12.0 - 15.0 g/dL   HCT 33.5 (L) 36 - 46 %   MCV 109.8 (H) 80.0 - 100.0 fL   MCH 36.4 (H) 26.0 - 34.0 pg   MCHC 33.1 30.0 - 36.0 g/dL   RDW 17.6 (H) 11.5 - 15.5 %   Platelets 111 (L) 150 - 400 K/uL   nRBC 0.0 0.0 - 0.2 %   Neutrophils Relative % 55 %   Neutro Abs 2.3 1.7 - 7.7 K/uL   Lymphocytes Relative 32 %   Lymphs Abs 1.3 0.7 - 4.0 K/uL   Monocytes Relative 11 %   Monocytes Absolute 0.5 0.1 - 1.0 K/uL   Eosinophils Relative 1 %   Eosinophils Absolute 0.1 0.0 - 0.5 K/uL   Basophils Relative 1 %   Basophils Absolute 0.0 0.0 - 0.1 K/uL   Immature Granulocytes 0 %   Abs Immature Granulocytes 0.01 0.00 - 0.07 K/uL    Comment: Performed at [facility], [address] [place], [place] [address]  Comprehensive metabolic panel     Status: Abnormal   Collection Time: 09/08/20  9:16 AM  Result Value Ref Range   Sodium 139 135 - 145 mmol/L   Potassium 3.9 3.5 - 5.1 mmol/L   Chloride 104 98 - 111 mmol/L   CO2 26 22 - 32 mmol/L   Glucose, Bld 145 (H) 70 - 99 mg/dL    Comment: Glucose reference range applies only to samples taken after fasting for at least 8 hours.   BUN 13 6 - 20 mg/dL   Creatinine, Ser 0.72 0.44 - 1.00 mg/dL   Calcium 9.1 8.9 - 10.3 mg/dL   Total Protein 7.3 6.5 - 8.1 g/dL   Albumin 3.7 3.5 - 5.0 g/dL   AST 18 15 - 41 U/L   ALT 16 0 - 44 U/L   Alkaline Phosphatase 54 38 - 126 U/L   Total Bilirubin 0.7 0.3 - 1.2 mg/dL   GFR calc non Af Amer >60 >60 mL/min   GFR calc Af Amer >60 >60 mL/min   Anion gap 9 5 - 15    Comment: Performed at [facility], [address] [place], [place] [address]  Urinalysis, Complete w Microscopic     Status: Abnormal   Collection Time: 09/29/20  9:12 AM  Result Value Ref Range   Color, Urine YELLOW (A) YELLOW   APPearance CLOUDY (A) CLEAR   Specific Gravity, Urine 1.025 1.005 - 1.030   pH 5.0 5.0 - 8.0   Glucose, UA NEGATIVE NEGATIVE mg/dL   Hgb urine dipstick  NEGATIVE NEGATIVE   Bilirubin Urine NEGATIVE NEGATIVE   Ketones, ur NEGATIVE NEGATIVE mg/dL   Protein, ur NEGATIVE NEGATIVE mg/dL   Nitrite NEGATIVE NEGATIVE   Leukocytes,Ua NEGATIVE NEGATIVE   WBC, UA 0-5 0 - 5 WBC/hpf   Bacteria, UA NONE SEEN NONE SEEN   Squamous Epithelial / LPF 6-10 0 - 5   Mucus PRESENT     Comment: Performed at [facility], [address] [place], [place] [address]  CA 125     Status: None   Collection Time: 09/29/20  9:12 AM  Result Value Ref Range   Cancer Antigen (CA) 125 7.7 0.0 - 38.1 U/mL    Comment: (NOTE) Roche Diagnostics Electrochemiluminescence Immunoassay (ECLIA) Values obtained with  different assay methods or kits cannot be used interchangeably.  Results cannot be interpreted as absolute evidence of the presence or absence of malignant disease. Performed At: [facility] [place], [place] [id number] Rush Farmer MD [id number]   Comprehensive metabolic panel     Status: Abnormal   Collection Time: 09/29/20  9:12 AM  Result Value Ref Range   Sodium 139 135 - 145 mmol/L   Potassium 4.0 3.5 - 5.1 mmol/L   Chloride 103 98 - 111 mmol/L   CO2 25 22 - 32 mmol/L   Glucose, Bld 139 (H) 70 - 99 mg/dL    Comment: Glucose reference range applies only to samples taken after fasting for at least 8 hours.   BUN 17 6 - 20 mg/dL   Creatinine, Ser 0.79 0.44 - 1.00 mg/dL   Calcium 8.8 (L) 8.9 - 10.3 mg/dL   Total Protein 7.4 6.5 - 8.1 g/dL   Albumin 3.6 3.5 - 5.0 g/dL   AST 24 15 - 41 U/L   ALT 18 0 - 44 U/L   Alkaline Phosphatase 64 38 - 126 U/L   Total Bilirubin 0.6 0.3 - 1.2 mg/dL   GFR calc non Af Amer >60 >60 mL/min   Anion gap 11 5 - 15    Comment: Performed at [facility], [address] [place], [place] [address]  CBC with Differential     Status: Abnormal   Collection Time: 09/29/20  9:12 AM  Result Value Ref Range   WBC 4.4 4.0 - 10.5 K/uL   RBC 2.68 (L) 3.87 - 5.11 MIL/uL   Hemoglobin 10.1 (L)  12.0 - 15.0 g/dL   HCT 29.1 (L) 36 - 46 %   MCV 108.6 (H) 80.0 - 100.0 fL   MCH 37.7 (H) 26.0 - 34.0 pg   MCHC 34.7 30.0 - 36.0 g/dL   RDW 14.8 11.5 - 15.5 %   Platelets 97 (L) 150 - 400 K/uL   nRBC 0.0 0.0 - 0.2 %   Neutrophils Relative % 53 %   Neutro Abs 2.3 1.7 - 7.7 K/uL   Lymphocytes Relative 37 %   Lymphs Abs 1.6 0.7 - 4.0 K/uL   Monocytes Relative 7 %   Monocytes Absolute 0.3 0.1 - 1.0 K/uL   Eosinophils Relative 2 %   Eosinophils Absolute 0.1 0.0 - 0.5 K/uL   Basophils Relative 1 %   Basophils Absolute 0.0 0.0 - 0.1 K/uL   WBC Morphology DIFF CONFIRMED BY MANUAL    RBC Morphology UNREMARKABLE    Smear Review PLATELETS APPEAR DECREASED     Comment: PLATELETS VARY IN SIZE   Immature Granulocytes 0 %   Abs Immature Granulocytes 0.01 0.00 - 0.07 K/uL    Comment: Performed at [facility], [address] [place], [place] [address]  Urinalysis, Complete w Microscopic     Status: Abnormal   Collection Time: 10/20/20  8:55 AM  Result Value Ref Range   Color, Urine YELLOW (A) YELLOW   APPearance CLOUDY (A) CLEAR   Specific Gravity, Urine 1.028 1.005 - 1.030   pH 5.0 5.0 - 8.0   Glucose, UA NEGATIVE NEGATIVE mg/dL   Hgb urine dipstick NEGATIVE NEGATIVE   Bilirubin Urine NEGATIVE NEGATIVE   Ketones, ur NEGATIVE NEGATIVE mg/dL   Protein, ur 30 (A) NEGATIVE mg/dL   Nitrite NEGATIVE NEGATIVE   Leukocytes,Ua NEGATIVE NEGATIVE   RBC / HPF 0-5 0 - 5 RBC/hpf   WBC, UA 0-5 0 -  5 WBC/hpf   Bacteria, UA NONE SEEN NONE SEEN   Squamous Epithelial / LPF 21-50 0 - 5   Mucus PRESENT     Comment: Performed at [facility], [address] [place], [place] [address]  [place] 125     Status: None   Collection Time: 10/20/20  8:55 AM  Result Value Ref Range   Cancer Antigen (CA) 125 7.6 0.0 - 38.1 U/mL    Comment: (NOTE) Roche Diagnostics Electrochemiluminescence Immunoassay (ECLIA) Values obtained with different assay methods or kits cannot be used interchangeably.   Results cannot be interpreted as absolute evidence of the presence or absence of malignant disease. Performed At: [facility] [place], [place] [id number] Rush Farmer MD IO:[id number]   Comprehensive metabolic panel     Status: Abnormal   Collection Time: 10/20/20  8:55 AM  Result Value Ref Range   Sodium 138 135 - 145 mmol/L   Potassium 3.7 3.5 - 5.1 mmol/L   Chloride 107 98 - 111 mmol/L   CO2 23 22 - 32 mmol/L   Glucose, Bld 134 (H) 70 - 99 mg/dL    Comment: Glucose reference range applies only to samples taken after fasting for at least 8 hours.   BUN 14 6 - 20 mg/dL   Creatinine, Ser 0.67 0.44 - 1.00 mg/dL   Calcium 8.2 (L) 8.9 - 10.3 mg/dL   Total Protein 6.7 6.5 - 8.1 g/dL   Albumin 3.4 (L) 3.5 - 5.0 g/dL   AST 21 15 - 41 U/L   ALT 16 0 - 44 U/L   Alkaline Phosphatase 60 38 - 126 U/L   Total Bilirubin 0.4 0.3 - 1.2 mg/dL   GFR, Estimated >60 >60 mL/min    Comment: (NOTE) Calculated using the CKD-EPI Creatinine Equation (2021)    Anion gap 8 5 - 15    Comment: Performed at [facility], [address] [place], [place] [address]  CBC with Differential     Status: Abnormal   Collection Time: 10/20/20  8:55 AM  Result Value Ref Range   WBC 4.8 4.0 - 10.5 K/uL   RBC 2.00 (L) 3.87 - 5.11 MIL/uL   Hemoglobin 7.4 (L) 12.0 - 15.0 g/dL   HCT 21.3 (L) 36 - 46 %   MCV 106.5 (H) 80.0 - 100.0 fL   MCH 37.0 (H) 26.0 - 34.0 pg   MCHC 34.7 30.0 - 36.0 g/dL   RDW 14.7 11.5 - 15.5 %   Platelets 109 (L) 150 - 400 K/uL   nRBC 0.0 0.0 - 0.2 %   Neutrophils Relative % 54 %   Neutro Abs 2.6 1.7 - 7.7 K/uL   Lymphocytes Relative 34 %   Lymphs Abs 1.6 0.7 - 4.0 K/uL   Monocytes Relative 8 %   Monocytes Absolute 0.4 0.1 - 1.0 K/uL   Eosinophils Relative 3 %   Eosinophils Absolute 0.1 0.0 - 0.5 K/uL   Basophils Relative 1 %   Basophils Absolute 0.0 0.0 - 0.1 K/uL   WBC Morphology DIFF CONFIRMED BY MANUAL    RBC Morphology MORPHOLOGY UNREMARKABLE     Smear Review Normal platelet morphology     Comment: PLATELETS APPEAR DECREASED Reviewed    Immature Granulocytes 0 %   Abs Immature Granulocytes 0.01 0.00 - 0.07 K/uL    Comment: Performed at [facility], [address] [place],  [address]  Urinalysis, Complete w Microscopic     Status: Abnormal   Collection  Time: 11/12/20  9:37 AM  Result Value Ref Range   Color, Urine YELLOW (A) YELLOW   APPearance CLOUDY (A) CLEAR   Specific Gravity, Urine 1.026 1.005 - 1.030   pH 5.0 5.0 - 8.0   Glucose, UA NEGATIVE NEGATIVE mg/dL   Hgb urine dipstick NEGATIVE NEGATIVE   Bilirubin Urine NEGATIVE NEGATIVE   Ketones, ur NEGATIVE NEGATIVE mg/dL   Protein, ur NEGATIVE NEGATIVE mg/dL   Nitrite NEGATIVE NEGATIVE   Leukocytes,Ua TRACE (A) NEGATIVE   RBC / HPF 0-5 0 - 5 RBC/hpf   WBC, UA 0-5 0 - 5 WBC/hpf   Bacteria, UA FEW (A) NONE SEEN   Squamous Epithelial / LPF 21-50 0 - 5   Mucus PRESENT    Hyaline Casts, UA PRESENT     Comment: Performed at [facility], [address] [place], [place] [address]  [place] [address]     Status: None   Collection Time: 11/12/20  9:37 AM  Result Value Ref Range   Cancer Antigen (CA) 125 7.5 0.0 - 38.1 U/mL    Comment: (NOTE) Roche Diagnostics Electrochemiluminescence Immunoassay (ECLIA) Values obtained with different assay methods or kits cannot be used interchangeably.  Results cannot be interpreted as absolute evidence of the presence or absence of malignant disease. Performed At: [facility] [place], [place] [id number] Rush Farmer MD [id number]   Comprehensive metabolic panel     Status: Abnormal   Collection Time: 11/12/20  9:37 AM  Result Value Ref Range   Sodium 136 135 - 145 mmol/L   Potassium 3.5 3.5 - 5.1 mmol/L   Chloride 101 98 - 111 mmol/L   CO2 25 22 - 32 mmol/L   Glucose, Bld 156 (H) 70 - 99 mg/dL    Comment: Glucose reference range applies only to samples taken after fasting for at least 8  hours.   BUN 14 6 - 20 mg/dL   Creatinine, Ser 0.57 0.44 - 1.00 mg/dL   Calcium 8.9 8.9 - 10.3 mg/dL   Total Protein 7.2 6.5 - 8.1 g/dL   Albumin 3.7 3.5 - 5.0 g/dL   AST 22 15 - 41 U/L   ALT 17 0 - 44 U/L   Alkaline Phosphatase 60 38 - 126 U/L   Total Bilirubin 0.6 0.3 - 1.2 mg/dL   GFR, Estimated >60 >60 mL/min    Comment: (NOTE) Calculated using the CKD-EPI Creatinine Equation (2021)    Anion gap 10 5 - 15    Comment: Performed at [facility], [address] [place], [place] [address]  CBC with Differential     Status: Abnormal   Collection Time: 11/12/20  9:37 AM  Result Value Ref Range   WBC 4.2 4.0 - 10.5 K/uL   RBC 2.84 (L) 3.87 - 5.11 MIL/uL   Hemoglobin 9.9 (L) 12.0 - 15.0 g/dL   HCT 31.3 (L) 36 - 46 %   MCV 110.2 (H) 80.0 - 100.0 fL   MCH 34.9 (H) 26.0 - 34.0 pg   MCHC 31.6 30.0 - 36.0 g/dL   RDW 17.2 (H) 11.5 - 15.5 %   Platelets 110 (L) 150 - 400 K/uL   nRBC 0.0 0.0 - 0.2 %   Neutrophils Relative % 56 %   Neutro Abs 2.3 1.7 - 7.7 K/uL   Lymphocytes Relative 32 %   Lymphs Abs 1.3 0.7 - 4.0 K/uL   Monocytes Relative 8 %   Monocytes Absolute 0.3 0.1 - 1.0 K/uL   Eosinophils  Relative 3 %   Eosinophils Absolute 0.1 0.0 - 0.5 K/uL   Basophils Relative 1 %   Basophils Absolute 0.0 0.0 - 0.1 K/uL   Immature Granulocytes 0 %   Abs Immature Granulocytes 0.01 0.00 - 0.07 K/uL    Comment: Performed at [facility], [address] [place], [place] [address]    Diabetic Foot Exam: Diabetic Foot Exam - Simple   No data filed     ***  PHQ2/9: Depression screen [facility] 2/9 06/09/2020 03/24/2020 12/10/2019 10/25/2019  Decreased Interest 0 0 1 0  Down, Depressed, Hopeless 0 0 2 0  PHQ - 2 Score 0 0 3 0  Altered sleeping 1 0 1 3  Tired, decreased energy 0 0 1 1  Change in appetite 0 0 1 0  Feeling bad or failure about yourself  0 0 0 1  Trouble concentrating 0 0 1 0  Moving slowly or fidgety/restless 0 0 0 0  Suicidal thoughts 0 0 0 0  PHQ-9 Score 1 0  7 5  Difficult doing work/chores Not difficult at all - Somewhat difficult Somewhat difficult  Some recent data might be hidden    phq 9 is {gen pos [id number]} ***  Fall Risk: Fall Risk  06/09/2020 03/24/2020 12/10/2019 10/25/2019 05/17/2018  Falls in the past year? 0 0 0 0 No  Number falls in past yr: - 0 0 0 -  Injury with Fall? - 0 0 0 -   ***   Functional Status Survey:   ***   Assessment & Plan  *** There are no diagnoses linked to this encounter.

## 2020-11-24 ENCOUNTER — Ambulatory Visit: Payer: Medicaid Other | Admitting: Family Medicine

## 2020-11-25 ENCOUNTER — Other Ambulatory Visit: Payer: Self-pay | Admitting: Orthopedic Surgery

## 2020-11-25 ENCOUNTER — Ambulatory Visit (INDEPENDENT_AMBULATORY_CARE_PROVIDER_SITE_OTHER): Payer: Medicaid Other | Admitting: Orthopedic Surgery

## 2020-11-25 ENCOUNTER — Other Ambulatory Visit: Payer: Self-pay | Admitting: *Deleted

## 2020-11-25 ENCOUNTER — Other Ambulatory Visit: Payer: Self-pay

## 2020-11-25 ENCOUNTER — Encounter: Payer: Self-pay | Admitting: Orthopedic Surgery

## 2020-11-25 ENCOUNTER — Ambulatory Visit: Payer: Medicaid Other

## 2020-11-25 VITALS — BP 140/89 | HR 89 | Ht 63.0 in | Wt 269.0 lb

## 2020-11-25 DIAGNOSIS — M545 Low back pain, unspecified: Secondary | ICD-10-CM

## 2020-11-25 DIAGNOSIS — G8929 Other chronic pain: Secondary | ICD-10-CM

## 2020-11-25 DIAGNOSIS — M25561 Pain in right knee: Secondary | ICD-10-CM

## 2020-11-25 DIAGNOSIS — Z6841 Body Mass Index (BMI) 40.0 and over, adult: Secondary | ICD-10-CM | POA: Diagnosis not present

## 2020-11-25 NOTE — Progress Notes (Signed)
New Patient Visit  Assessment: [name] is a 49 y.o. female with the following: 1. Chronic midline low back pain, without sciatica  2. Chronic pain of right knee; mild knee arthritis.  Baker's cyst.    Plan: Mrs. Beers has chronic lower back pain that has been bothering her for a while.  She has been undergoing chemotherapy treatment for ovarian cancer, as well as surgery to remove cancer.  Overall, she is doing well in her recovery, but I think this has contributed to general deconditioning.  She will benefit from physical therapy to strengthen her core muscles as well as her right knee, which has some mild degenerative changes on XR and a Baker's cyst on exam.  If her knee pain worsens, we could consider an injection, but I do not think this is necessary currently.  OTC NSAIDs are appropriate for her pain.  She does not have any radiating pains currently, so I have low concern for nerve compression.  We also discussed the importance of weight loss as this can help to alleviate some of her pain in her back and her knee. Follow up as needed.   The patient meets the AMA guidelines for Morbid obesity with BMI > 40.  The patient has been counseled on weight loss.    Follow-up: Return if symptoms worsen or fail to improve.  Subjective:  Chief Complaint  Patient presents with  . Back Pain    lower back   . Knee Pain    right knee     History of Present Illness: [name] is a 49 y.o. female who has been referred to clinic by Charlaine Dalton, MD for right knee pain, and low back pain.  She has had lower back pain for years, but has progressively worsened.  She has difficulty standing for more than 10 minutes due to the pain.  She also has difficulty holding her young grandchild in a seated position.  Pain is primarily located in her lower back.  It does not radiate down either leg.  Regarding her right knee, she denies a specific injury.  She notes pressure and difficulty  flexing her knee, but she does not think it is swollen.  She had major surgery to remove cancer earlier this year, but has not completed any formal physical therapy.  She has taken some narcotic pain medication following surgery, and will also take tylenol or ibuprofen.   Review of Systems: No fevers or chills Numbness and tingling plantar feet and fingers.  No chest pain No shortness of breath No bowel or bladder dysfunction No GI distress No headaches   Medical History:  Past Medical History:  Diagnosis Date  . 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   Pathogenic BRCA1 mutation at [place]  . Cancer of bronchus of right upper lobe ([place]) 12/11/2019  . Clotting disorder ([facility])    Right arm blood clot when she started Chemo.  . Depression   . Drug-induced androgenic alopecia   . Dysrhythmia   . Family history of breast cancer   . GERD (gastroesophageal reflux disease)   . Hypertension   . Menorrhagia   . Migraines   . Osteoarthritis    back  . Ovarian cancer ([place]) 12/10/2019  . Personal history of chemotherapy    ovarian cancer  . Plantar fasciitis     Past Surgical History:  Procedure Laterality Date  . ABDOMINAL HYSTERECTOMY  03/2020  . APPENDECTOMY     LSC but "ruptured when they did the  surgery"  . CESAREAN SECTION    . CYSTOSCOPY N/A 04/08/2020   Procedure: CYSTOSCOPY;  [name], MD;  [name];  Service: Gynecology;  [name];  . IR THORACENTESIS ASP PLEURAL SPACE W/IMG GUIDE  12/06/2019  . IUD REMOVAL N/A 04/08/2020   Procedure: INTRAUTERINE DEVICE (IUD) REMOVAL;  [name], MD;  [name];  Service: Gynecology;  [name];  . PARACENTESIS     x6  . PORTA CATH INSERTION N/A 04/23/2020   Procedure: PORTA CATH INSERTION;  [name], MD;  Location: [facility];  Service: Cardiovascular;  [name];  . TUBAL LIGATION     at time of CSxn  . WRIST SURGERY Left 11/21/2016    plates and screws inserted    Family History  Adopted: Yes  Problem Relation Age of Onset  . Lung cancer Father        deceased 80  . Breast cancer Mother 27       currently 79  . Colon cancer Mother   . ADD / ADHD Son   . ADD / ADHD Son   . Early death Maternal Aunt   . Breast cancer Maternal Aunt 34       deceased 48  . Breast cancer Maternal Grandmother   . Depression Daughter   . Depression Daughter   . Prostate cancer Paternal Uncle   . Stroke Paternal Uncle   . Leukemia Paternal Aunt   . Breast cancer Paternal Grandmother   . Cancer Maternal Uncle    Social History   Tobacco Use  . Smoking status: Current Every Day Smoker    Packs/day: 0.50    Years: 30.00    Pack years: 15.00    Types: Cigarettes  . Smokeless tobacco: Never Used  Vaping Use  . Vaping Use: Never used  Substance Use Topics  . Alcohol use: Not Currently    Alcohol/week: 0.0 standard drinks  . Drug use: No    No Known Allergies  Current Meds  Medication Sig  . ALPRAZolam (XANAX) 0.25 MG tablet Take 1 tablet (0.25 mg total) by mouth 2 (two) times daily as needed for anxiety. Please do not drive while on the medication- as this can cause dizziness.  . B Complex-C (B-COMPLEX WITH VITAMIN C) tablet Take 1 tablet by mouth daily.  . Biotin w/ Vitamins C & E (HAIR/SKIN/NAILS PO) Take 1 tablet by mouth daily.  . cloNIDine (CATAPRES) 0.1 MG tablet Take 1 tablet (0.1 mg total) by mouth at bedtime.  . cloNIDine (CATAPRES-TTS-2) 0.2 mg/24hr patch Place 1 patch (0.2 mg total) onto the skin once a week.  . DULoxetine (CYMBALTA) 60 MG capsule Take 1 capsule (60 mg total) by mouth at bedtime.  . lidocaine-prilocaine (EMLA) cream Apply 1 application topically as needed.  . loratadine (CLARITIN) 10 MG tablet Take 1 tablet (10 mg total) by mouth every morning.  . Magnesium 400 MG CAPS   . metoprolol succinate (TOPROL-XL) 25 MG 24 hr tablet TAKE 1/2 TABLET(12.5 MG) BY MOUTH DAILY  . Multiple Vitamin  (MULTIVITAMIN WITH MINERALS) TABS tablet Take 1 tablet by mouth daily.  . nicotine (NICODERM CQ - DOSED IN MG/24 HOURS) 14 mg/24hr patch Place 1 patch (14 mg total) onto the skin daily.  Marland Kitchen olaparib (LYNPARZA) 100 MG tablet Take 1 pill twice a day [along with 198m pill- Total 250 mg twice a day]. Swallow whole. May take with food to decrease nausea and vomiting.  DO NOT START UNTIL EVALUATED BY MD.  . olaparib (LYNPARZA) 150 MG tablet Take 1 pill twice a day [along with 100 mg pill- Total 250 mg twice a day]. Swallow whole. May take with food to decrease nausea and vomiting. DO NOT START UNTIL EVALUATED BY MD.  . omeprazole (PRILOSEC) 20 MG capsule Take 1 capsule (20 mg total) by mouth daily.  . ondansetron (ZOFRAN) 8 MG tablet One pill every 8 hours as needed for nausea/vomitting.  Marland Kitchen oxyCODONE (OXY IR/ROXICODONE) 5 MG immediate release tablet Take 1-2 tablets (5-10 mg total) by mouth every 8 (eight) hours as needed for severe pain.  . pregabalin (LYRICA) 50 MG capsule Take 1 capsule (50 mg total) by mouth 3 (three) times daily.  . prochlorperazine (COMPAZINE) 10 MG tablet Take 1 tablet (10 mg total) by mouth every 6 (six) hours as needed for nausea or vomiting.  . sennosides-docusate sodium (SENOKOT-S) 8.6-50 MG tablet Take 1 tablet by mouth at bedtime.  . SUMAtriptan (IMITREX) 100 MG tablet Take 1 tablet (100 mg total) by mouth every 2 (two) hours as needed for migraine. May repeat in 2 hours if headache persists or recurs.  . traZODone (DESYREL) 50 MG tablet Take 0.5-1 tablets (25-50 mg total) by mouth at bedtime as needed for sleep.  . valACYclovir (VALTREX) 1000 MG tablet Take 1 tablet (1,000 mg total) by mouth 2 (two) times daily as needed (outbreak).    Objective: BP 140/89   Pulse 89   Ht _0  (1.6 m)   Wt 269 lb (122 kg)   LMP 06/19/2017   BMI 47.65 kg/m   Physical Exam:  General: Alert and oriented, no acute distress Gait: Slow, mildly right-sided antalgic gait  Evaluation of  the spine demonstrates no obvious deformity.  She is tender to palpation, primarily in the midline.  She tolerates hyperflexion, without radiating pains.  She is able to bend forward, and touch her toes without significant discomfort.  Strength is 5/5 throughout her lower extremity.  Sensation is intact with exception of the plantar feet.  2+ Achilles tendon reflex bilaterally, 2+ patellar tendon reflex bilaterally  Evaluation of the right knee demonstrates full extension, full slightly restricted flexion.  There is a fullness in the posterior aspect of her knee.  This is tender to palpation.  Mild tenderness to palpation along the medial joint line.  Negative Lachman.  No obvious effusion or deformity.    IMAGING: I personally ordered and reviewed the following images  X-rays of the lumbar spine, standing, were obtained in clinic today and demonstrates degenerative changes, specifically within the lower lumbar spine and the lumbosacral junction.  There is no listhesis appreciated.  Mild loss of disc height.  Impression: Mild degenerative changes within the lumbar spine without obvious anterolisthesis.  X-rays of the right knee were obtained in clinic today and demonstrates a mild varus alignment overall.  Mild degenerative changes are noted, primarily within the medial compartment with some loss of joint space.  No significant osteophytes are noted.  Impression: Right knee arthritis, mild, primarily within the medial compartment.  New Medications:  No orders of the defined types were placed in this encounter.     Mordecai Rasmussen, MD  11/25/2020 12:44 PM

## 2020-11-26 MED ORDER — OXYCODONE HCL 5 MG PO TABS
5.0000 mg | ORAL_TABLET | Freq: Three times a day (TID) | ORAL | 0 refills | Status: DC | PRN
Start: 2020-11-26 — End: 2020-12-14

## 2020-12-01 ENCOUNTER — Ambulatory Visit: Payer: Medicaid Other

## 2020-12-03 ENCOUNTER — Inpatient Hospital Stay: Payer: Medicaid Other | Attending: Internal Medicine

## 2020-12-03 ENCOUNTER — Inpatient Hospital Stay (HOSPITAL_BASED_OUTPATIENT_CLINIC_OR_DEPARTMENT_OTHER): Payer: Medicaid Other | Admitting: Internal Medicine

## 2020-12-03 ENCOUNTER — Other Ambulatory Visit: Payer: Self-pay

## 2020-12-03 ENCOUNTER — Inpatient Hospital Stay: Payer: Medicaid Other

## 2020-12-03 DIAGNOSIS — M5126 Other intervertebral disc displacement, lumbar region: Secondary | ICD-10-CM | POA: Diagnosis not present

## 2020-12-03 DIAGNOSIS — R531 Weakness: Secondary | ICD-10-CM | POA: Insufficient documentation

## 2020-12-03 DIAGNOSIS — Z90722 Acquired absence of ovaries, bilateral: Secondary | ICD-10-CM | POA: Diagnosis not present

## 2020-12-03 DIAGNOSIS — C482 Malignant neoplasm of peritoneum, unspecified: Secondary | ICD-10-CM

## 2020-12-03 DIAGNOSIS — G893 Neoplasm related pain (acute) (chronic): Secondary | ICD-10-CM | POA: Diagnosis not present

## 2020-12-03 DIAGNOSIS — Z85118 Personal history of other malignant neoplasm of bronchus and lung: Secondary | ICD-10-CM | POA: Insufficient documentation

## 2020-12-03 DIAGNOSIS — K439 Ventral hernia without obstruction or gangrene: Secondary | ICD-10-CM | POA: Insufficient documentation

## 2020-12-03 DIAGNOSIS — Z148 Genetic carrier of other disease: Secondary | ICD-10-CM | POA: Insufficient documentation

## 2020-12-03 DIAGNOSIS — Z5111 Encounter for antineoplastic chemotherapy: Secondary | ICD-10-CM | POA: Insufficient documentation

## 2020-12-03 DIAGNOSIS — Z6841 Body Mass Index (BMI) 40.0 and over, adult: Secondary | ICD-10-CM | POA: Insufficient documentation

## 2020-12-03 DIAGNOSIS — D649 Anemia, unspecified: Secondary | ICD-10-CM | POA: Diagnosis not present

## 2020-12-03 DIAGNOSIS — G629 Polyneuropathy, unspecified: Secondary | ICD-10-CM | POA: Diagnosis not present

## 2020-12-03 DIAGNOSIS — N951 Menopausal and female climacteric states: Secondary | ICD-10-CM | POA: Insufficient documentation

## 2020-12-03 DIAGNOSIS — I10 Essential (primary) hypertension: Secondary | ICD-10-CM | POA: Insufficient documentation

## 2020-12-03 DIAGNOSIS — Z95828 Presence of other vascular implants and grafts: Secondary | ICD-10-CM

## 2020-12-03 DIAGNOSIS — F32A Depression, unspecified: Secondary | ICD-10-CM | POA: Insufficient documentation

## 2020-12-03 DIAGNOSIS — J910 Malignant pleural effusion: Secondary | ICD-10-CM | POA: Diagnosis not present

## 2020-12-03 DIAGNOSIS — M1990 Unspecified osteoarthritis, unspecified site: Secondary | ICD-10-CM | POA: Diagnosis not present

## 2020-12-03 DIAGNOSIS — F17210 Nicotine dependence, cigarettes, uncomplicated: Secondary | ICD-10-CM | POA: Insufficient documentation

## 2020-12-03 DIAGNOSIS — R5383 Other fatigue: Secondary | ICD-10-CM | POA: Diagnosis not present

## 2020-12-03 DIAGNOSIS — Z90710 Acquired absence of both cervix and uterus: Secondary | ICD-10-CM | POA: Diagnosis not present

## 2020-12-03 DIAGNOSIS — Z7189 Other specified counseling: Secondary | ICD-10-CM

## 2020-12-03 DIAGNOSIS — Z86718 Personal history of other venous thrombosis and embolism: Secondary | ICD-10-CM | POA: Diagnosis not present

## 2020-12-03 DIAGNOSIS — Z79899 Other long term (current) drug therapy: Secondary | ICD-10-CM | POA: Diagnosis not present

## 2020-12-03 DIAGNOSIS — K219 Gastro-esophageal reflux disease without esophagitis: Secondary | ICD-10-CM | POA: Diagnosis not present

## 2020-12-03 LAB — COMPREHENSIVE METABOLIC PANEL
ALT: 16 U/L (ref 0–44)
AST: 24 U/L (ref 15–41)
Albumin: 3.7 g/dL (ref 3.5–5.0)
Alkaline Phosphatase: 56 U/L (ref 38–126)
Anion gap: 10 (ref 5–15)
BUN: 10 mg/dL (ref 6–20)
CO2: 26 mmol/L (ref 22–32)
Calcium: 8.9 mg/dL (ref 8.9–10.3)
Chloride: 103 mmol/L (ref 98–111)
Creatinine, Ser: 0.63 mg/dL (ref 0.44–1.00)
GFR, Estimated: >60 mL/min (ref >60)
Glucose, Bld: 133 mg/dL — ABNORMAL HIGH (ref 70–99)
Potassium: 3.6 mmol/L (ref 3.5–5.1)
Sodium: 139 mmol/L (ref 135–145)
Total Bilirubin: 0.5 mg/dL (ref 0.3–1.2)
Total Protein: 7.1 g/dL (ref 6.5–8.1)

## 2020-12-03 LAB — CBC WITH DIFFERENTIAL/PLATELET
Abs Immature Granulocytes: 0.02 10*3/uL (ref 0.00–0.07)
Basophils Absolute: 0 10*3/uL (ref 0.0–0.1)
Basophils Relative: 1 %
Eosinophils Absolute: 0.1 10*3/uL (ref 0.0–0.5)
Eosinophils Relative: 3 %
HCT: 36.3 % (ref 36.0–46.0)
Hemoglobin: 11.5 g/dL — ABNORMAL LOW (ref 12.0–15.0)
Immature Granulocytes: 0 %
Lymphocytes Relative: 32 %
Lymphs Abs: 1.7 10*3/uL (ref 0.7–4.0)
MCH: 33.2 pg (ref 26.0–34.0)
MCHC: 31.7 g/dL (ref 30.0–36.0)
MCV: 104.9 fL — ABNORMAL HIGH (ref 80.0–100.0)
Monocytes Absolute: 0.5 10*3/uL (ref 0.1–1.0)
Monocytes Relative: 10 %
Neutro Abs: 3 10*3/uL (ref 1.7–7.7)
Neutrophils Relative %: 54 %
Platelets: 155 10*3/uL (ref 150–400)
RBC: 3.46 MIL/uL — ABNORMAL LOW (ref 3.87–5.11)
RDW: 16.5 % — ABNORMAL HIGH (ref 11.5–15.5)
Smear Review: NORMAL
WBC: 5.4 10*3/uL (ref 4.0–10.5)
nRBC: 0 % (ref 0.0–0.2)

## 2020-12-03 LAB — URINALYSIS, COMPLETE (UACMP) WITH MICROSCOPIC
Bilirubin Urine: NEGATIVE
Glucose, UA: NEGATIVE mg/dL
Hgb urine dipstick: NEGATIVE
Ketones, ur: NEGATIVE mg/dL
Leukocytes,Ua: NEGATIVE
Nitrite: NEGATIVE
Protein, ur: NEGATIVE mg/dL
Specific Gravity, Urine: 1.018 (ref 1.005–1.030)
pH: 5 (ref 5.0–8.0)

## 2020-12-03 MED ORDER — HEPARIN SOD (PORK) LOCK FLUSH 100 UNIT/ML IV SOLN
INTRAVENOUS | Status: AC
  Filled 2020-12-03: qty 5

## 2020-12-03 MED ORDER — SODIUM CHLORIDE 0.9% FLUSH
10.0000 mL | Freq: Once | INTRAVENOUS | Status: AC
  Administered 2020-12-03: 10 mL via INTRAVENOUS
  Filled 2020-12-03: qty 10

## 2020-12-03 MED ORDER — SODIUM CHLORIDE 0.9 % IV SOLN
Freq: Once | INTRAVENOUS | Status: AC
  Filled 2020-12-03: qty 250

## 2020-12-03 MED ORDER — HEPARIN SOD (PORK) LOCK FLUSH 100 UNIT/ML IV SOLN
500.0000 [IU] | Freq: Once | INTRAVENOUS | Status: DC | PRN
  Filled 2020-12-03: qty 5

## 2020-12-03 MED ORDER — HEPARIN SOD (PORK) LOCK FLUSH 100 UNIT/ML IV SOLN
500.0000 [IU] | Freq: Once | INTRAVENOUS | Status: AC
  Administered 2020-12-03: 500 [IU] via INTRAVENOUS
  Filled 2020-12-03: qty 5

## 2020-12-03 MED ORDER — SODIUM CHLORIDE 0.9 % IV SOLN
1800.0000 mg | Freq: Once | INTRAVENOUS | Status: AC
  Administered 2020-12-03: 1800 mg via INTRAVENOUS
  Filled 2020-12-03: qty 64

## 2020-12-03 MED ORDER — DEXAMETHASONE SODIUM PHOSPHATE 10 MG/ML IJ SOLN
4.0000 mg | Freq: Once | INTRAMUSCULAR | Status: AC
  Administered 2020-12-03: 4 mg via INTRAVENOUS
  Filled 2020-12-03: qty 1

## 2020-12-03 MED ORDER — SODIUM CHLORIDE 0.9 % IV SOLN: 4.0000 mg | Freq: Once | INTRAVENOUS | Status: DC

## 2020-12-03 NOTE — Progress Notes (Signed)
Stable at discharge 

## 2020-12-03 NOTE — Assessment & Plan Note (Addendum)
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 STABLE;  tumor markers- improving.   # proceed with  [facility] [HOLD Lymparza-see below]. 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   # Knee pain right/right hip- no trauma/ worse-likely arthritic s/p orthopedics eval; awaiting PT- STABLE.   #Abdominal swelling-ventral hernia; as per patient intermittent abdominal discomfort especially with coughing.  Recommend abdominal binder.  Await evaluation with gynecology oncology next week  # DISPOSITION:  #  avsatin today #Follow-up in 3 weeks- days; X-MD labs-CBC CMP; ca-125; UA; Avastin- Dr.B
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
## 2020-12-04 LAB — CA 125: Cancer Antigen (CA) 125: 7.4 U/mL (ref 0.0–38.1)

## 2020-12-08 NOTE — Progress Notes (Deleted)
Name: [name]   MRN: [id number]    DOB: Jan 01, 1971   Date:12/08/2020       Progress Note  Subjective  Chief Complaint  Follow Up  HPI  HTN/Tachycardia: currently on metoprolol 12.5 mg but down to one daily, we will change to metoprolol ER 25 mg , heart rate still a little elevated, very seldom has palpitation   Morbid obesity: she lost weight during chemo, but she has gained it back, discussed healthy diet, appetite is much better.   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 Alto [place]. She is having some spotting, night sweats are severe, on Clonidine 0.2 mg , no side effects, but bp towards low end of normal, we will add clonidine 0.1 mg qhs and monitor for now. 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 Unable to play with grandkids because she has no energy secondary to chemo. She is home all day - on long term disability - she feels lonely and too much time on her hands. Realizing that only has a hand full of friends.   Patient Active Problem List   Diagnosis Date Noted  . 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 Arm vein blood clot, right 03/11/2020  . Malignant ascites 12/22/2019  . Acute dyspnea 12/22/2019  . Pleural effusion on right  12/22/2019  . Tachycardia 12/22/2019  . Primary peritoneal carcinomatosis ([place]) 12/16/2019  . Goals of care, counseling/discussion 12/16/2019  . Serous adenocarcinoma ([place]) 12/11/2019  . Erythrocytosis 11/12/2019  . Morbid obesity with BMI of 40.0-44.9, adult ([place]) 07/07/2018  . 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 Fever blister 05/17/2018  . Family history of breast cancer 05/08/2018  . Family history of colon cancer in mother 05/08/2018  . Migraine with aura and without status migrainosus 04/18/2018  . Dysmenorrhea 06/21/2017  . Menorrhagia 06/21/2017  . Mild recurrent major depression ([place]) 01/20/2016  . Esophageal reflux 01/20/2016  . Osteoarthritis, multiple sites 01/20/2016  . Frequent headaches 01/20/2016    Past Surgical History:  Procedure Laterality Date  . ABDOMINAL HYSTERECTOMY  03/2020  . APPENDECTOMY     LSC but "ruptured when they did the surgery"  . CESAREAN SECTION    . CYSTOSCOPY N/A 04/08/2020   Procedure: CYSTOSCOPY;  [name], MD;  [name];  Service: Gynecology;  [name];  . IR THORACENTESIS ASP PLEURAL SPACE W/IMG GUIDE  12/06/2019  . IUD REMOVAL N/A 04/08/2020   Procedure: INTRAUTERINE DEVICE (IUD) REMOVAL;  [name], MD;  [name];  Service: Gynecology;  [name];  . PARACENTESIS     x6  . PORTA CATH INSERTION N/A 04/23/2020   Procedure: PORTA CATH INSERTION;  [name], MD;  Location: [place]  [facility];  Service: Cardiovascular;  [name];  . TUBAL LIGATION     at time of CSxn  . WRIST SURGERY Left 11/21/2016   plates and screws inserted    Family History  Adopted: Yes  Problem Relation Age of Onset  . Lung cancer Father        deceased 35  . Breast cancer Mother 67       currently 23  . Colon cancer Mother   . ADD / ADHD Son   . ADD / ADHD Son   . Early death Maternal Aunt   . Breast cancer Maternal Aunt 34       deceased 28  . Breast cancer  Maternal Grandmother   . Depression Daughter   . Depression Daughter   . Prostate cancer Paternal Uncle   . Stroke Paternal Uncle   . Leukemia Paternal Aunt   . Breast cancer Paternal Grandmother   . Cancer Maternal Uncle     Social History   Tobacco Use  . Smoking status: Current Every Day Smoker    Packs/day: 0.50    Years: 30.00    Pack years: 15.00    Types: Cigarettes  . Smokeless tobacco: Never Used  Substance Use Topics  . Alcohol use: Not Currently    Alcohol/week: 0.0 standard drinks     Current Outpatient Medications:  .  ALPRAZolam (XANAX) 0.25 MG tablet, Take 1 tablet (0.25 mg total) by mouth 2 (two) times daily as needed for anxiety. Please do not drive while on the medication- as this can cause dizziness., Disp: 30 tablet, Rfl: 0 .  B Complex-C (B-COMPLEX WITH VITAMIN C) tablet, Take 1 tablet by mouth daily., Disp: , Rfl:  .  Biotin w/ Vitamins C & E (HAIR/SKIN/NAILS PO), Take 1 tablet by mouth daily., Disp: , Rfl:  .  cloNIDine (CATAPRES) 0.1 MG tablet, Take 1 tablet (0.1 mg total) by mouth at bedtime., Disp: 90 tablet, Rfl: 1 .  cloNIDine (CATAPRES-TTS-2) 0.2 mg/24hr patch, Place 1 patch (0.2 mg total) onto the skin once a week., Disp: 12 patch, Rfl: 3 .  DULoxetine (CYMBALTA) 60 MG capsule, Take 1 capsule (60 mg total) by mouth at bedtime., Disp: 90 capsule, Rfl: 1 .  lidocaine-prilocaine (EMLA) cream, Apply 1 application topically as needed. (Patient not taking: Reported on 12/03/2020), Disp: 30 g, Rfl: 3 .  loratadine (CLARITIN) 10 MG tablet, Take 1 tablet (10 mg total) by mouth every morning., Disp: 30 tablet, Rfl: 3 .  Magnesium 400 MG CAPS, , Disp: , Rfl:  .  metoprolol succinate (TOPROL-XL) 25 MG 24 hr tablet, TAKE 1/2 TABLET(12.5 MG) BY MOUTH DAILY, Disp: 45 tablet, Rfl: 1 .  Multiple Vitamin (MULTIVITAMIN WITH MINERALS) TABS tablet, Take 1 tablet by mouth daily., Disp: , Rfl:  .  nicotine (NICODERM CQ - DOSED IN MG/24 HOURS) 14 mg/24hr patch, Place 1 patch  (14 mg total) onto the skin daily. (Patient not taking: Reported on 12/03/2020), Disp: 28 patch, Rfl: 0 .  olaparib (LYNPARZA) 100 MG tablet, Take 1 pill twice a day [along with 138m pill- Total 250 mg twice a day]. Swallow whole. May take with food to decrease nausea and vomiting. DO NOT START UNTIL EVALUATED BY MD. (Patient not taking: Reported on 12/03/2020), Disp: 60 tablet, Rfl: 3 .  olaparib (LYNPARZA) 150 MG tablet, Take 1 pill twice a day [along with 100 mg pill- Total 250 mg twice a day]. Swallow whole. May take with food to decrease nausea and  vomiting. DO NOT START UNTIL EVALUATED BY MD. (Patient not taking: Reported on 12/03/2020), Disp: 60 tablet, Rfl: 3 .  omeprazole (PRILOSEC) 20 MG capsule, Take 1 capsule (20 mg total) by mouth daily., Disp: 90 capsule, Rfl: 1 .  ondansetron (ZOFRAN) 8 MG tablet, One pill every 8 hours as needed for nausea/vomitting. (Patient not taking: Reported on 12/03/2020), Disp: 40 tablet, Rfl: 1 .  oxyCODONE (OXY IR/ROXICODONE) 5 MG immediate release tablet, Take 1-2 tablets (5-10 mg total) by mouth every 8 (eight) hours as needed for severe pain., Disp: 60 tablet, Rfl: 0 .  pregabalin (LYRICA) 50 MG capsule, Take 1 capsule (50 mg total) by mouth 3 (three) times daily., Disp: 90 capsule, Rfl: 2 .  prochlorperazine (COMPAZINE) 10 MG tablet, Take 1 tablet (10 mg total) by mouth every 6 (six) hours as needed for nausea or vomiting. (Patient not taking: Reported on 12/03/2020), Disp: 40 tablet, Rfl: 1 .  sennosides-docusate sodium (SENOKOT-S) 8.6-50 MG tablet, Take 1 tablet by mouth at bedtime. (Patient not taking: Reported on 12/03/2020), Disp: , Rfl:  .  SUMAtriptan (IMITREX) 100 MG tablet, Take 1 tablet (100 mg total) by mouth every 2 (two) hours as needed for migraine. May repeat in 2 hours if headache persists or recurs., Disp: 10 tablet, Rfl: 0 .  traZODone (DESYREL) 50 MG tablet, Take 0.5-1 tablets (25-50 mg total) by mouth at bedtime as needed for sleep., Disp: 30  tablet, Rfl: 2 .  valACYclovir (VALTREX) 1000 MG tablet, Take 1 tablet (1,000 mg total) by mouth 2 (two) times daily as needed (outbreak)., Disp: 6 tablet, Rfl: 0 No current facility-administered medications for this visit.  Facility-Administered Medications Ordered in Other Visits:  .  sodium chloride flush (NS) 0.9 % injection 10 mL, 10 mL, Intravenous, Once, Brahmanday, Elisha Headland, MD  No Known Allergies  I personally reviewed {Reviewed:[id number]} with the patient/caregiver today.   ROS  ***  Objective  There were no vitals filed for this visit.  There is no height or weight on file to calculate BMI.  Physical Exam ***  Recent Results (from the past 2160 hour(s))  Urinalysis, Complete w Microscopic     Status: Abnormal   Collection Time: 09/29/20  9:12 AM  Result Value Ref Range   Color, Urine YELLOW (A) YELLOW   APPearance CLOUDY (A) CLEAR   Specific Gravity, Urine 1.025 1.005 - 1.030   pH 5.0 5.0 - 8.0   Glucose, UA NEGATIVE NEGATIVE mg/dL   Hgb urine dipstick NEGATIVE NEGATIVE   Bilirubin Urine NEGATIVE NEGATIVE   Ketones, ur NEGATIVE NEGATIVE mg/dL   Protein, ur NEGATIVE NEGATIVE mg/dL   Nitrite NEGATIVE NEGATIVE   Leukocytes,Ua NEGATIVE NEGATIVE   WBC, UA 0-5 0 - 5 WBC/hpf   Bacteria, UA NONE SEEN NONE SEEN   Squamous Epithelial / LPF 6-10 0 - 5   Mucus PRESENT     Comment: Performed at [facility], [address] [place], [place] [address]  CA 125     Status: None   Collection Time: 09/29/20  9:12 AM  Result Value Ref Range   Cancer Antigen ([place]) 125 7.7 0.0 - 38.1 U/mL    Comment: (NOTE) Roche Diagnostics Electrochemiluminescence Immunoassay (ECLIA) Values obtained with different assay methods or kits cannot be used interchangeably.  Results cannot be interpreted as absolute evidence of the presence or absence of malignant disease. Performed At: [facility] [address] [place], [place] [id number] Rush Farmer MD [id number]    Comprehensive metabolic panel  Status: Abnormal   Collection Time: 09/29/20  9:12 AM  Result Value Ref Range   Sodium 139 135 - 145 mmol/L   Potassium 4.0 3.5 - 5.1 mmol/L   Chloride 103 98 - 111 mmol/L   CO2 25 22 - 32 mmol/L   Glucose, Bld 139 (H) 70 - 99 mg/dL    Comment: Glucose reference range applies only to samples taken after fasting for at least 8 hours.   BUN 17 6 - 20 mg/dL   Creatinine, Ser 0.79 0.44 - 1.00 mg/dL   Calcium 8.8 (L) 8.9 - 10.3 mg/dL   Total Protein 7.4 6.5 - 8.1 g/dL   Albumin 3.6 3.5 - 5.0 g/dL   AST 24 15 - 41 U/L   ALT 18 0 - 44 U/L   Alkaline Phosphatase 64 38 - 126 U/L   Total Bilirubin 0.6 0.3 - 1.2 mg/dL   GFR calc non Af Amer >60 >60 mL/min   Anion gap 11 5 - 15    Comment: Performed at [facility], [address] [place], [place] [address]  CBC with Differential     Status: Abnormal   Collection Time: 09/29/20  9:12 AM  Result Value Ref Range   WBC 4.4 4.0 - 10.5 K/uL   RBC 2.68 (L) 3.87 - 5.11 MIL/uL   Hemoglobin 10.1 (L) 12.0 - 15.0 g/dL   HCT 29.1 (L) 36.0 - 46.0 %   MCV 108.6 (H) 80.0 - 100.0 fL   MCH 37.7 (H) 26.0 - 34.0 pg   MCHC 34.7 30.0 - 36.0 g/dL   RDW 14.8 11.5 - 15.5 %   Platelets 97 (L) 150 - 400 K/uL   nRBC 0.0 0.0 - 0.2 %   Neutrophils Relative % 53 %   Neutro Abs 2.3 1.7 - 7.7 K/uL   Lymphocytes Relative 37 %   Lymphs Abs 1.6 0.7 - 4.0 K/uL   Monocytes Relative 7 %   Monocytes Absolute 0.3 0.1 - 1.0 K/uL   Eosinophils Relative 2 %   Eosinophils Absolute 0.1 0.0 - 0.5 K/uL   Basophils Relative 1 %   Basophils Absolute 0.0 0.0 - 0.1 K/uL   WBC Morphology DIFF CONFIRMED BY MANUAL    RBC Morphology UNREMARKABLE    Smear Review PLATELETS APPEAR DECREASED     Comment: PLATELETS VARY IN SIZE   Immature Granulocytes 0 %   Abs Immature Granulocytes 0.01 0.00 - 0.07 K/uL    Comment: Performed at [facility], [address] [place], [place] [address]  Urinalysis, Complete w Microscopic     Status:  Abnormal   Collection Time: 10/20/20  8:55 AM  Result Value Ref Range   Color, Urine YELLOW (A) YELLOW   APPearance CLOUDY (A) CLEAR   Specific Gravity, Urine 1.028 1.005 - 1.030   pH 5.0 5.0 - 8.0   Glucose, UA NEGATIVE NEGATIVE mg/dL   Hgb urine dipstick NEGATIVE NEGATIVE   Bilirubin Urine NEGATIVE NEGATIVE   Ketones, ur NEGATIVE NEGATIVE mg/dL   Protein, ur 30 (A) NEGATIVE mg/dL   Nitrite NEGATIVE NEGATIVE   Leukocytes,Ua NEGATIVE NEGATIVE   RBC / HPF 0-5 0 - 5 RBC/hpf   WBC, UA 0-5 0 - 5 WBC/hpf   Bacteria, UA NONE SEEN NONE SEEN   Squamous Epithelial / LPF 21-50 0 - 5   Mucus PRESENT     Comment: Performed at [facility], [address] [place], [place] [address]  CA 125     Status:  None   Collection Time: 10/20/20  8:55 AM  Result Value Ref Range   Cancer Antigen ([place]) 125 7.6 0.0 - 38.1 U/mL    Comment: (NOTE) Roche [employer]ostics Electrochemiluminescence Immunoassay (ECLIA) Values obtained with different assay methods or kits cannot be used interchangeably.  Results cannot be interpreted as absolute evidence of the presence or absence of malignant disease. Performed At: [facility] [place], [place] [id number] Rush Farmer MD [id number]   Comprehensive metabolic panel     Status: Abnormal   Collection Time: 10/20/20  8:55 AM  Result Value Ref Range   Sodium 138 135 - 145 mmol/L   Potassium 3.7 3.5 - 5.1 mmol/L   Chloride 107 98 - 111 mmol/L   CO2 23 22 - 32 mmol/L   Glucose, Bld 134 (H) 70 - 99 mg/dL    Comment: Glucose reference range applies only to samples taken after fasting for at least 8 hours.   BUN 14 6 - 20 mg/dL   Creatinine, Ser 0.67 0.44 - 1.00 mg/dL   Calcium 8.2 (L) 8.9 - 10.3 mg/dL   Total Protein 6.7 6.5 - 8.1 g/dL   Albumin 3.4 (L) 3.5 - 5.0 g/dL   AST 21 15 - 41 U/L   ALT 16 0 - 44 U/L   Alkaline Phosphatase 60 38 - 126 U/L   Total Bilirubin 0.4 0.3 - 1.2 mg/dL   GFR, Estimated >60 >60 mL/min    Comment:  (NOTE) Calculated using the CKD-EPI Creatinine Equation (2021)    Anion gap 8 5 - 15    Comment: Performed at [facility], [address] [place], [place] [address]  CBC with Differential     Status: Abnormal   Collection Time: 10/20/20  8:55 AM  Result Value Ref Range   WBC 4.8 4.0 - 10.5 K/uL   RBC 2.00 (L) 3.87 - 5.11 MIL/uL   Hemoglobin 7.4 (L) 12.0 - 15.0 g/dL   HCT 21.3 (L) 36.0 - 46.0 %   MCV 106.5 (H) 80.0 - 100.0 fL   MCH 37.0 (H) 26.0 - 34.0 pg   MCHC 34.7 30.0 - 36.0 g/dL   RDW 14.7 11.5 - 15.5 %   Platelets 109 (L) 150 - 400 K/uL   nRBC 0.0 0.0 - 0.2 %   Neutrophils Relative % 54 %   Neutro Abs 2.6 1.7 - 7.7 K/uL   Lymphocytes Relative 34 %   Lymphs Abs 1.6 0.7 - 4.0 K/uL   Monocytes Relative 8 %   Monocytes Absolute 0.4 0.1 - 1.0 K/uL   Eosinophils Relative 3 %   Eosinophils Absolute 0.1 0.0 - 0.5 K/uL   Basophils Relative 1 %   Basophils Absolute 0.0 0.0 - 0.1 K/uL   WBC Morphology DIFF CONFIRMED BY MANUAL    RBC Morphology MORPHOLOGY UNREMARKABLE    Smear Review Normal platelet morphology     Comment: PLATELETS APPEAR DECREASED Reviewed    Immature Granulocytes 0 %   Abs Immature Granulocytes 0.01 0.00 - 0.07 K/uL    Comment: Performed at [facility], [address] [place], [place] [address]  Urinalysis, Complete w Microscopic     Status: Abnormal   Collection Time: 11/12/20  9:37 AM  Result Value Ref Range   Color, Urine YELLOW (A) YELLOW   APPearance CLOUDY (A) CLEAR   Specific Gravity, Urine 1.026 1.005 - 1.030   pH 5.0 5.0 - 8.0   Glucose, UA NEGATIVE NEGATIVE mg/dL   Hgb urine  dipstick NEGATIVE NEGATIVE   Bilirubin Urine NEGATIVE NEGATIVE   Ketones, ur NEGATIVE NEGATIVE mg/dL   Protein, ur NEGATIVE NEGATIVE mg/dL   Nitrite NEGATIVE NEGATIVE   Leukocytes,Ua TRACE (A) NEGATIVE   RBC / HPF 0-5 0 - 5 RBC/hpf   WBC, UA 0-5 0 - 5 WBC/hpf   Bacteria, UA FEW (A) NONE SEEN   Squamous Epithelial / LPF 21-50 0 - 5   Mucus PRESENT     Hyaline Casts, UA PRESENT     Comment: Performed at [facility], [address] [place], [place] [address]  CA 125     Status: None   Collection Time: 11/12/20  9:37 AM  Result Value Ref Range   Cancer Antigen ([place]) 125 7.5 0.0 - 38.1 U/mL    Comment: (NOTE) Roche Diagnostics Electrochemiluminescence Immunoassay (ECLIA) Values obtained with different assay methods or kits cannot be used interchangeably.  Results cannot be interpreted as absolute evidence of the presence or absence of malignant disease. Performed At: [facility] [place], [place] [id number] Rush Farmer MD [id number]   Comprehensive metabolic panel     Status: Abnormal   Collection Time: 11/12/20  9:37 AM  Result Value Ref Range   Sodium 136 135 - 145 mmol/L   Potassium 3.5 3.5 - 5.1 mmol/L   Chloride 101 98 - 111 mmol/L   CO2 25 22 - 32 mmol/L   Glucose, Bld 156 (H) 70 - 99 mg/dL    Comment: Glucose reference range applies only to samples taken after fasting for at least 8 hours.   BUN 14 6 - 20 mg/dL   Creatinine, Ser 0.57 0.44 - 1.00 mg/dL   Calcium 8.9 8.9 - 10.3 mg/dL   Total Protein 7.2 6.5 - 8.1 g/dL   Albumin 3.7 3.5 - 5.0 g/dL   AST 22 15 - 41 U/L   ALT 17 0 - 44 U/L   Alkaline Phosphatase 60 38 - 126 U/L   Total Bilirubin 0.6 0.3 - 1.2 mg/dL   GFR, Estimated >60 >60 mL/min    Comment: (NOTE) Calculated using the CKD-EPI Creatinine Equation (2021)    Anion gap 10 5 - 15    Comment: Performed at [facility], [address] [place], [place] [address]  CBC with Differential     Status: Abnormal   Collection Time: 11/12/20  9:37 AM  Result Value Ref Range   WBC 4.2 4.0 - 10.5 K/uL   RBC 2.84 (L) 3.87 - 5.11 MIL/uL   Hemoglobin 9.9 (L) 12.0 - 15.0 g/dL   HCT 31.3 (L) 36.0 - 46.0 %   MCV 110.2 (H) 80.0 - 100.0 fL   MCH 34.9 (H) 26.0 - 34.0 pg   MCHC 31.6 30.0 - 36.0 g/dL   RDW 17.2 (H) 11.5 - 15.5 %   Platelets 110 (L) 150 - 400 K/uL   nRBC 0.0 0.0 -  0.2 %   Neutrophils Relative % 56 %   Neutro Abs 2.3 1.7 - 7.7 K/uL   Lymphocytes Relative 32 %   Lymphs Abs 1.3 0.7 - 4.0 K/uL   Monocytes Relative 8 %   Monocytes Absolute 0.3 0.1 - 1.0 K/uL   Eosinophils Relative 3 %   Eosinophils Absolute 0.1 0.0 - 0.5 K/uL   Basophils Relative 1 %   Basophils Absolute 0.0 0.0 - 0.1 K/uL   Immature Granulocytes 0 %   Abs Immature Granulocytes 0.01 0.00 - 0.07 K/uL    Comment: Performed at  [facility], [address] [place], [place] [address]  Comprehensive metabolic panel     Status: Abnormal   Collection Time: 12/03/20  9:07 AM  Result Value Ref Range   Sodium 139 135 - 145 mmol/L   Potassium 3.6 3.5 - 5.1 mmol/L   Chloride 103 98 - 111 mmol/L   CO2 26 22 - 32 mmol/L   Glucose, Bld 133 (H) 70 - 99 mg/dL    Comment: Glucose reference range applies only to samples taken after fasting for at least 8 hours.   BUN 10 6 - 20 mg/dL   Creatinine, Ser 0.63 0.44 - 1.00 mg/dL   Calcium 8.9 8.9 - 10.3 mg/dL   Total Protein 7.1 6.5 - 8.1 g/dL   Albumin 3.7 3.5 - 5.0 g/dL   AST 24 15 - 41 U/L   ALT 16 0 - 44 U/L   Alkaline Phosphatase 56 38 - 126 U/L   Total Bilirubin 0.5 0.3 - 1.2 mg/dL   GFR, Estimated >60 >60 mL/min    Comment: (NOTE) Calculated using the CKD-EPI Creatinine Equation (2021)    Anion gap 10 5 - 15    Comment: Performed at [facility], [address] [place], [place] [address]  CBC with Differential     Status: Abnormal   Collection Time: 12/03/20  9:07 AM  Result Value Ref Range   WBC 5.4 4.0 - 10.5 K/uL   RBC 3.46 (L) 3.87 - 5.11 MIL/uL   Hemoglobin 11.5 (L) 12.0 - 15.0 g/dL   HCT 36.3 36.0 - 46.0 %   MCV 104.9 (H) 80.0 - 100.0 fL   MCH 33.2 26.0 - 34.0 pg   MCHC 31.7 30.0 - 36.0 g/dL   RDW 16.5 (H) 11.5 - 15.5 %   Platelets 155 150 - 400 K/uL   nRBC 0.0 0.0 - 0.2 %   Neutrophils Relative % 54 %   Neutro Abs 3.0 1.7 - 7.7 K/uL   Lymphocytes Relative 32 %   Lymphs Abs 1.7 0.7 - 4.0 K/uL   Monocytes  Relative 10 %   Monocytes Absolute 0.5 0.1 - 1.0 K/uL   Eosinophils Relative 3 %   Eosinophils Absolute 0.1 0.0 - 0.5 K/uL   Basophils Relative 1 %   Basophils Absolute 0.0 0.0 - 0.1 K/uL   WBC Morphology DIFF CONFIRMED BY MANUAL.    RBC Morphology UNREMARKABLE    Smear Review Normal platelet morphology     Comment: PLATELETS APPEAR ADEQUATE   Immature Granulocytes 0 %   Abs Immature Granulocytes 0.02 0.00 - 0.07 K/uL    Comment: Performed at [facility], [address] [place], [place] [address]  CA 125     Status: None   Collection Time: 12/03/20  9:07 AM  Result Value Ref Range   Cancer Antigen (CA) 125 7.4 0.0 - 38.1 U/mL    Comment: (NOTE) Roche Diagnostics Electrochemiluminescence Immunoassay (ECLIA) Values obtained with different assay methods or kits cannot be used interchangeably.  Results cannot be interpreted as absolute evidence of the presence or absence of malignant disease. Performed At: [facility] [place], [place] [id number] Rush Farmer MD [id number]   Urinalysis, Complete w Microscopic     Status: Abnormal   Collection Time: 12/03/20  9:16 AM  Result Value Ref Range   Color, Urine YELLOW (A) YELLOW   APPearance CLOUDY (A) CLEAR   Specific Gravity, Urine 1.018 1.005 - 1.030   pH 5.0 5.0 - 8.0  Glucose, UA NEGATIVE NEGATIVE mg/dL   Hgb urine dipstick NEGATIVE NEGATIVE   Bilirubin Urine NEGATIVE NEGATIVE   Ketones, ur NEGATIVE NEGATIVE mg/dL   Protein, ur NEGATIVE NEGATIVE mg/dL   Nitrite NEGATIVE NEGATIVE   Leukocytes,Ua NEGATIVE NEGATIVE   RBC / HPF 0-5 0 - 5 RBC/hpf   WBC, UA 0-5 0 - 5 WBC/hpf   Bacteria, UA FEW (A) NONE SEEN   Squamous Epithelial / LPF 21-50 0 - 5   Mucus PRESENT    Hyaline Casts, UA PRESENT     Comment: Performed at [facility], [address], [place], [place] [address]    Diabetic Foot Exam: Diabetic Foot Exam - Simple   No data filed    ***  PHQ2/9: Depression screen [facility]  2/9 06/09/2020 03/24/2020 12/10/2019 10/25/2019  Decreased Interest 0 0 1 0  Down, Depressed, Hopeless 0 0 2 0  PHQ - 2 Score 0 0 3 0  Altered sleeping 1 0 1 3  Tired, decreased energy 0 0 1 1  Change in appetite 0 0 1 0  Feeling bad or failure about yourself  0 0 0 1  Trouble concentrating 0 0 1 0  Moving slowly or fidgety/restless 0 0 0 0  Suicidal thoughts 0 0 0 0  PHQ-9 Score 1 0 7 5  Difficult doing work/chores Not difficult at all - Somewhat difficult Somewhat difficult  Some recent data might be hidden    phq 9 is {gen pos [id number]} ***  Fall Risk: Fall Risk  06/09/2020 03/24/2020 12/10/2019 10/25/2019 05/17/2018  Falls in the past year? 0 0 0 0 No  Number falls in past yr: - 0 0 0 -  Injury with Fall? - 0 0 0 -   ***   Functional Status Survey:   ***   Assessment & Plan  *** There are no diagnoses linked to this encounter.

## 2020-12-09 ENCOUNTER — Ambulatory Visit: Payer: Medicaid Other | Admitting: Family Medicine

## 2020-12-09 ENCOUNTER — Inpatient Hospital Stay (HOSPITAL_BASED_OUTPATIENT_CLINIC_OR_DEPARTMENT_OTHER): Payer: Medicaid Other | Admitting: Obstetrics and Gynecology

## 2020-12-09 VITALS — BP 138/77 | HR 91 | Temp 98.0°F | Resp 20 | Wt 268.2 lb

## 2020-12-09 DIAGNOSIS — N951 Menopausal and female climacteric states: Secondary | ICD-10-CM

## 2020-12-09 DIAGNOSIS — C482 Malignant neoplasm of peritoneum, unspecified: Secondary | ICD-10-CM | POA: Diagnosis not present

## 2020-12-09 DIAGNOSIS — G893 Neoplasm related pain (acute) (chronic): Secondary | ICD-10-CM | POA: Diagnosis not present

## 2020-12-09 DIAGNOSIS — K432 Incisional hernia without obstruction or gangrene: Secondary | ICD-10-CM

## 2020-12-09 DIAGNOSIS — D649 Anemia, unspecified: Secondary | ICD-10-CM | POA: Diagnosis not present

## 2020-12-09 DIAGNOSIS — C786 Secondary malignant neoplasm of retroperitoneum and peritoneum: Secondary | ICD-10-CM | POA: Diagnosis not present

## 2020-12-09 DIAGNOSIS — J910 Malignant pleural effusion: Secondary | ICD-10-CM

## 2020-12-09 DIAGNOSIS — Z5111 Encounter for antineoplastic chemotherapy: Secondary | ICD-10-CM | POA: Diagnosis not present

## 2020-12-09 DIAGNOSIS — Z79899 Other long term (current) drug therapy: Secondary | ICD-10-CM
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
## 2020-12-11 ENCOUNTER — Ambulatory Visit: Payer: Medicaid Other | Admitting: Family Medicine

## 2020-12-14 ENCOUNTER — Encounter: Payer: Self-pay | Admitting: Internal Medicine

## 2020-12-14 ENCOUNTER — Other Ambulatory Visit: Payer: Self-pay | Admitting: *Deleted

## 2020-12-14 IMAGING — DX PORTABLE CHEST - 1 VIEW
1 series · 1 of 1 positions shown · non-contrast
Comparison: May 14, 2018

CLINICAL DATA: Cough

EXAM:
PORTABLE CHEST 1 VIEW

[chest]
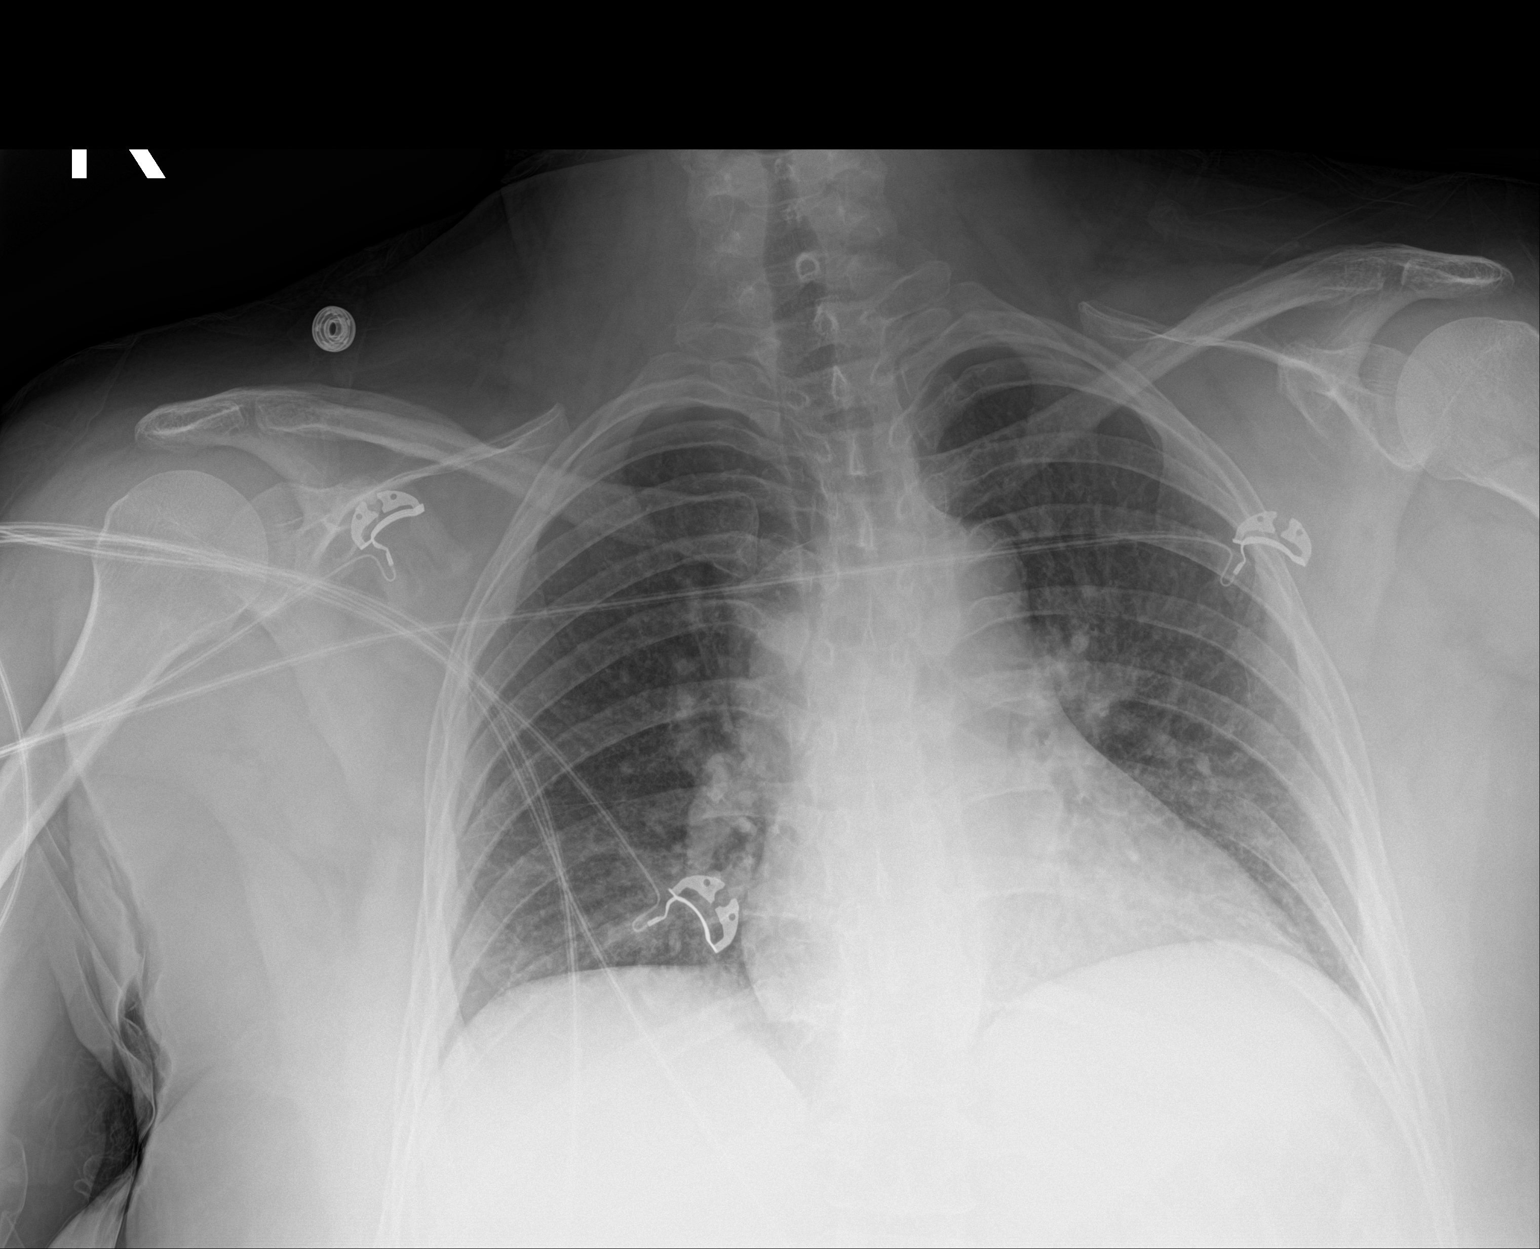

[1 of 1 positions shown; findings below may reference images not displayed]

FINDINGS: The heart size is normal. There is no focal consolidation. No large
pleural effusion. No pneumothorax. No acute osseous abnormality.
IMPRESSION: No active disease.

## 2020-12-14 MED ORDER — OXYCODONE HCL 5 MG PO TABS
5.0000 mg | ORAL_TABLET | Freq: Three times a day (TID) | ORAL | 0 refills | Status: DC | PRN
Start: 2020-12-14 — End: 2021-01-04

## 2020-12-15 ENCOUNTER — Other Ambulatory Visit: Payer: Self-pay | Admitting: *Deleted

## 2020-12-15 DIAGNOSIS — F5101 Primary insomnia: Secondary | ICD-10-CM

## 2020-12-15 NOTE — Telephone Encounter (Signed)
Patient called reporting that the [facility] on [address] does not have Oxycodone and she called the one on [address] and they have it so she would like it sent there

## 2020-12-15 NOTE — Telephone Encounter (Signed)
Patient called back and said that [facility] just called her and said they gt Oxycodone in and have her prescription ready

## 2020-12-21 ENCOUNTER — Other Ambulatory Visit: Payer: Self-pay | Admitting: Family Medicine

## 2020-12-21 ENCOUNTER — Other Ambulatory Visit: Payer: Self-pay

## 2020-12-21 ENCOUNTER — Encounter: Payer: Self-pay | Admitting: Obstetrics and Gynecology

## 2020-12-21 ENCOUNTER — Ambulatory Visit
Admission: RE | Admit: 2020-12-21 | Discharge: 2020-12-21 | Disposition: A | Discharge status: Home / Self Care | Payer: Medicaid Other | Source: Ambulatory Visit | Attending: Family Medicine | Admitting: Family Medicine

## 2020-12-21 ENCOUNTER — Other Ambulatory Visit: Payer: Self-pay | Admitting: Internal Medicine

## 2020-12-21 DIAGNOSIS — C482 Malignant neoplasm of peritoneum, unspecified: Secondary | ICD-10-CM | POA: Diagnosis not present

## 2020-12-21 DIAGNOSIS — Z1509 Genetic susceptibility to other malignant neoplasm: Secondary | ICD-10-CM | POA: Insufficient documentation

## 2020-12-21 DIAGNOSIS — Z1501 Genetic susceptibility to malignant neoplasm of breast: Secondary | ICD-10-CM | POA: Diagnosis not present

## 2020-12-21 DIAGNOSIS — K219 Gastro-esophageal reflux disease without esophagitis: Secondary | ICD-10-CM

## 2020-12-21 DIAGNOSIS — N6489 Other specified disorders of breast: Secondary | ICD-10-CM | POA: Diagnosis not present

## 2020-12-21 MED ORDER — GADOBUTROL 1 MMOL/ML IV SOLN
10.0000 mL | Freq: Once | INTRAVENOUS | Status: AC | PRN
  Administered 2020-12-21: 10 mL via INTRAVENOUS

## 2020-12-22 ENCOUNTER — Other Ambulatory Visit: Payer: Self-pay | Admitting: *Deleted

## 2020-12-22 ENCOUNTER — Other Ambulatory Visit: Payer: Self-pay | Admitting: Hospice and Palliative Medicine

## 2020-12-22 ENCOUNTER — Telehealth: Payer: Self-pay | Admitting: Hospice and Palliative Medicine

## 2020-12-22 ENCOUNTER — Other Ambulatory Visit: Payer: Self-pay | Admitting: Family Medicine

## 2020-12-22 ENCOUNTER — Encounter: Payer: Self-pay | Admitting: *Deleted

## 2020-12-22 DIAGNOSIS — R61 Generalized hyperhidrosis: Secondary | ICD-10-CM

## 2020-12-22 DIAGNOSIS — T451X5A Adverse effect of antineoplastic and immunosuppressive drugs, initial encounter: Secondary | ICD-10-CM

## 2020-12-22 DIAGNOSIS — N6320 Unspecified lump in the left breast, unspecified quadrant: Secondary | ICD-10-CM

## 2020-12-22 DIAGNOSIS — G620 Drug-induced polyneuropathy: Secondary | ICD-10-CM

## 2020-12-22 DIAGNOSIS — R9389 Abnormal findings on diagnostic imaging of other specified body structures: Secondary | ICD-10-CM

## 2020-12-22 MED ORDER — PREGABALIN 50 MG PO CAPS: 50.0000 mg | ORAL_CAPSULE | Freq: Three times a day (TID) | ORAL | 2 refills | Status: DC

## 2020-12-22 NOTE — Telephone Encounter (Signed)
I received a call from patient.  Apparently, PCP had ordered an MRI of the breast, which resulted yesterday and shows BI-RADS 4 lesion in left breast with recommendation for MRI guided biopsy.  Patient reportedly contacted her PCP but was unable to be seen.  Patient is concerned about the lesion and wants to pursue work-up given her BRCA positive and family history of breast cancer.  Case discussed with Dr. Grayland Ormond, who is covering for Dr. Rogue Bussing.  Recommendation was to pursue biopsy.  Will order.

## 2020-12-22 NOTE — Progress Notes (Signed)
Patient with a birads 4 breast MRI - Recommendation for left breast biopsy.  Called patient to inform her of her appointment for MRI guided left breast biopsy at [facility] on 12/29/20 @ 6:50 for a 7:10 appointment.  Patient instructed to be NPO 4 hours prior to coming in.  She is also aware that she will go to the [facility] after the biopsy for a mammogram for review of clip placement.  Reviewed MRI results.  States she had read them on my chart.  Questions were answered.

## 2020-12-22 NOTE — Telephone Encounter (Signed)
[name] called patient and provided her with biopsy date/times

## 2020-12-22 NOTE — Telephone Encounter (Signed)
RF request submitted from patient's pharmacy. Lauren - please consider RF

## 2020-12-22 NOTE — Telephone Encounter (Signed)
Requested medication (s) are due for refill today: yes  Requested medication (s) are on the active medication list: yes  Last refill:  09/19/2020  Future visit scheduled: no  Notes to clinic: overdue for appt Vm left for patient to call and schedule    Requested Prescriptions  Pending Prescriptions Disp Refills   cloNIDine (CATAPRES) 0.1 MG tablet [Pharmacy Med Name: CLONIDINE 0.1MG  TABLETS] 90 tablet 1    Sig: TAKE 1 TABLET(0.1 MG) BY MOUTH AT BEDTIME      Cardiovascular:  Alpha-2 Agonists Failed - 12/22/2020  8:26 AM      Failed - Valid encounter within last 6 months    Recent Outpatient Visits           6 months ago Cancer [facility])   [facility] Steele Sizer, MD   9 months ago Serous adenocarcinoma [facility])   [facility] Steele Sizer, MD   1 year ago Cancer [facility])   [facility], Drue Stager, MD   1 year ago Mild episode of recurrent major depressive disorder [facility])   [facility], Drue Stager, MD   2 years ago Mild episode of recurrent major depressive disorder [facility])   [facility], Drue Stager, MD                Passed - Last BP in normal range    BP Readings from Last 1 Encounters:  12/09/20 138/77          Passed - Last Heart Rate in normal range    Pulse Readings from Last 1 Encounters:  12/09/20 91

## 2020-12-23 ENCOUNTER — Other Ambulatory Visit: Payer: Self-pay | Admitting: Nurse Practitioner

## 2020-12-23 MED ORDER — CLONIDINE 0.3 MG/24HR TD PTWK: 0.3000 mg | MEDICATED_PATCH | TRANSDERMAL | 5 refills | Status: DC

## 2020-12-23 MED FILL — LYNPARZA 150 MG TABLET: 150 | 30 days supply | Qty: 60 | Fill #1

## 2020-12-23 MED FILL — LYNPARZA 100 MG TAB: 100 | 30 days supply | Qty: 60 | Fill #1

## 2020-12-24 ENCOUNTER — Inpatient Hospital Stay (HOSPITAL_BASED_OUTPATIENT_CLINIC_OR_DEPARTMENT_OTHER): Payer: Medicaid Other | Admitting: Oncology

## 2020-12-24 ENCOUNTER — Inpatient Hospital Stay: Payer: Medicaid Other

## 2020-12-24 ENCOUNTER — Encounter: Payer: Self-pay | Admitting: Oncology

## 2020-12-24 ENCOUNTER — Other Ambulatory Visit: Payer: Self-pay

## 2020-12-24 VITALS — BP 136/79 | HR 73 | Temp 97.4°F | Resp 20 | Wt 269.9 lb

## 2020-12-24 DIAGNOSIS — Z7189 Other specified counseling: Secondary | ICD-10-CM

## 2020-12-24 DIAGNOSIS — Z5112 Encounter for antineoplastic immunotherapy: Secondary | ICD-10-CM

## 2020-12-24 DIAGNOSIS — C482 Malignant neoplasm of peritoneum, unspecified: Secondary | ICD-10-CM

## 2020-12-24 DIAGNOSIS — G620 Drug-induced polyneuropathy: Secondary | ICD-10-CM

## 2020-12-24 DIAGNOSIS — Z5111 Encounter for antineoplastic chemotherapy: Secondary | ICD-10-CM | POA: Diagnosis not present

## 2020-12-24 DIAGNOSIS — C801 Malignant (primary) neoplasm, unspecified: Secondary | ICD-10-CM | POA: Diagnosis not present

## 2020-12-24 DIAGNOSIS — T451X5A Adverse effect of antineoplastic and immunosuppressive drugs, initial encounter: Secondary | ICD-10-CM

## 2020-12-24 LAB — URINALYSIS, COMPLETE (UACMP) WITH MICROSCOPIC
Bilirubin Urine: NEGATIVE
Glucose, UA: NEGATIVE mg/dL
Hgb urine dipstick: NEGATIVE
Ketones, ur: NEGATIVE mg/dL
Nitrite: NEGATIVE
Protein, ur: NEGATIVE mg/dL
Specific Gravity, Urine: 1.02 (ref 1.005–1.030)
pH: 5 (ref 5.0–8.0)

## 2020-12-24 LAB — COMPREHENSIVE METABOLIC PANEL
ALT: 18 U/L (ref 0–44)
AST: 24 U/L (ref 15–41)
Albumin: 3.7 g/dL (ref 3.5–5.0)
Alkaline Phosphatase: 54 U/L (ref 38–126)
Anion gap: 9 (ref 5–15)
BUN: 13 mg/dL (ref 6–20)
CO2: 28 mmol/L (ref 22–32)
Calcium: 9 mg/dL (ref 8.9–10.3)
Chloride: 100 mmol/L (ref 98–111)
Creatinine, Ser: 0.73 mg/dL (ref 0.44–1.00)
GFR, Estimated: >60 mL/min (ref >60)
Glucose, Bld: 133 mg/dL — ABNORMAL HIGH (ref 70–99)
Potassium: 3.6 mmol/L (ref 3.5–5.1)
Sodium: 137 mmol/L (ref 135–145)
Total Bilirubin: 0.6 mg/dL (ref 0.3–1.2)
Total Protein: 7.1 g/dL (ref 6.5–8.1)

## 2020-12-24 LAB — CBC WITH DIFFERENTIAL/PLATELET
Abs Immature Granulocytes: 0.01 10*3/uL (ref 0.00–0.07)
Basophils Absolute: 0 10*3/uL (ref 0.0–0.1)
Basophils Relative: 1 %
Eosinophils Absolute: 0.1 10*3/uL (ref 0.0–0.5)
Eosinophils Relative: 2 %
HCT: 34.4 % — ABNORMAL LOW (ref 36.0–46.0)
Hemoglobin: 11.3 g/dL — ABNORMAL LOW (ref 12.0–15.0)
Immature Granulocytes: 0 %
Lymphocytes Relative: 31 %
Lymphs Abs: 1.7 10*3/uL (ref 0.7–4.0)
MCH: 32.9 pg (ref 26.0–34.0)
MCHC: 32.8 g/dL (ref 30.0–36.0)
MCV: 100.3 fL — ABNORMAL HIGH (ref 80.0–100.0)
Monocytes Absolute: 0.4 10*3/uL (ref 0.1–1.0)
Monocytes Relative: 8 %
Neutro Abs: 3 10*3/uL (ref 1.7–7.7)
Neutrophils Relative %: 58 %
Platelets: 122 10*3/uL — ABNORMAL LOW (ref 150–400)
RBC: 3.43 MIL/uL — ABNORMAL LOW (ref 3.87–5.11)
RDW: 17.5 % — ABNORMAL HIGH (ref 11.5–15.5)
Smear Review: NORMAL
WBC: 5.3 10*3/uL (ref 4.0–10.5)
nRBC: 0 % (ref 0.0–0.2)

## 2020-12-24 MED ORDER — SODIUM CHLORIDE 0.9 % IV SOLN: 4.0000 mg | Freq: Once | INTRAVENOUS | Status: DC

## 2020-12-24 MED ORDER — HEPARIN SOD (PORK) LOCK FLUSH 100 UNIT/ML IV SOLN
500.0000 [IU] | Freq: Once | INTRAVENOUS | Status: AC | PRN
  Administered 2020-12-24: 500 [IU]
  Filled 2020-12-24: qty 5

## 2020-12-24 MED ORDER — DEXAMETHASONE SODIUM PHOSPHATE 10 MG/ML IJ SOLN
4.0000 mg | Freq: Once | INTRAMUSCULAR | Status: AC
  Administered 2020-12-24: 4 mg via INTRAVENOUS
  Filled 2020-12-24: qty 1

## 2020-12-24 MED ORDER — HEPARIN SOD (PORK) LOCK FLUSH 100 UNIT/ML IV SOLN
INTRAVENOUS | Status: AC
  Filled 2020-12-24: qty 5

## 2020-12-24 MED ORDER — SODIUM CHLORIDE 0.9 % IV SOLN
Freq: Once | INTRAVENOUS | Status: AC
  Filled 2020-12-24: qty 250

## 2020-12-24 MED ORDER — SODIUM CHLORIDE 0.9 % IV SOLN
1800.0000 mg | Freq: Once | INTRAVENOUS | Status: AC
  Administered 2020-12-24: 1800 mg via INTRAVENOUS
  Filled 2020-12-24: qty 64

## 2020-12-24 NOTE — Progress Notes (Signed)
Pt tolerated infusion well with no signs of complications. VSS. Pt stable for discharge.   [name]  
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
## 2020-12-25 LAB — CA 125: Cancer Antigen (CA) 125: 6.9 U/mL (ref 0.0–38.1)

## 2020-12-28 ENCOUNTER — Other Ambulatory Visit: Payer: Self-pay | Admitting: *Deleted

## 2020-12-28 ENCOUNTER — Ambulatory Visit
Admission: RE | Admit: 2020-12-28 | Discharge: 2020-12-28 | Disposition: A | Discharge status: Home / Self Care | Payer: Medicaid Other | Source: Ambulatory Visit | Attending: Nurse Practitioner | Admitting: Nurse Practitioner

## 2020-12-28 ENCOUNTER — Other Ambulatory Visit: Payer: Self-pay

## 2020-12-28 DIAGNOSIS — Z8543 Personal history of malignant neoplasm of ovary: Secondary | ICD-10-CM | POA: Diagnosis not present

## 2020-12-28 DIAGNOSIS — Z452 Encounter for adjustment and management of vascular access device: Secondary | ICD-10-CM | POA: Diagnosis not present

## 2020-12-28 DIAGNOSIS — C482 Malignant neoplasm of peritoneum, unspecified: Secondary | ICD-10-CM | POA: Diagnosis not present

## 2020-12-28 DIAGNOSIS — Z90710 Acquired absence of both cervix and uterus: Secondary | ICD-10-CM | POA: Diagnosis not present

## 2020-12-28 DIAGNOSIS — K439 Ventral hernia without obstruction or gangrene: Secondary | ICD-10-CM | POA: Diagnosis not present

## 2020-12-28 DIAGNOSIS — C3411 Malignant neoplasm of upper lobe, right bronchus or lung: Secondary | ICD-10-CM | POA: Diagnosis not present

## 2020-12-28 MED ORDER — IOHEXOL 350 MG/ML SOLN
100.0000 mL | Freq: Once | INTRAVENOUS | Status: AC | PRN
  Administered 2020-12-28: 100 mL via INTRAVENOUS

## 2020-12-28 MED ORDER — IOHEXOL 350 MG/ML SOLN: 100.0000 mL | Freq: Once | INTRAVENOUS | Status: DC | PRN

## 2020-12-29 ENCOUNTER — Inpatient Hospital Stay: Admission: RE | Admit: 2020-12-29 | Payer: Medicaid Other | Source: Ambulatory Visit

## 2020-12-30 ENCOUNTER — Telehealth: Payer: Self-pay | Admitting: *Deleted

## 2020-12-30 NOTE — Telephone Encounter (Signed)
[name], Will contact them. I already refaxed the information to them earlier

## 2020-12-30 NOTE — Telephone Encounter (Signed)
Financial assistance Program called requesting a return call to confirm added information  On patient form [phone]

## 2020-12-31 NOTE — Telephone Encounter (Signed)
Spoke with Amy at [employer]. She confirmed that she has received our faxes to confirm patient's diagnosis and that she under active treatment. She was able to confirm approval of patient's application process and will notify the patient that she has been approved.

## 2021-01-04 ENCOUNTER — Ambulatory Visit
Admission: RE | Admit: 2021-01-04 | Discharge: 2021-01-04 | Disposition: A | Discharge status: Home / Self Care | Payer: Medicaid Other | Source: Ambulatory Visit | Attending: Hospice and Palliative Medicine | Admitting: Hospice and Palliative Medicine

## 2021-01-04 ENCOUNTER — Other Ambulatory Visit: Payer: Self-pay | Admitting: Internal Medicine

## 2021-01-04 ENCOUNTER — Other Ambulatory Visit: Payer: Self-pay | Admitting: Family Medicine

## 2021-01-04 ENCOUNTER — Other Ambulatory Visit: Payer: Self-pay

## 2021-01-04 DIAGNOSIS — R9389 Abnormal findings on diagnostic imaging of other specified body structures: Secondary | ICD-10-CM

## 2021-01-04 DIAGNOSIS — R61 Generalized hyperhidrosis: Secondary | ICD-10-CM

## 2021-01-04 HISTORY — PX: BREAST BIOPSY: SHX20

## 2021-01-04 MED ORDER — GADOBUTROL 1 MMOL/ML IV SOLN
10.0000 mL | Freq: Once | INTRAVENOUS | Status: AC | PRN
  Administered 2021-01-04: 10 mL via INTRAVENOUS

## 2021-01-04 NOTE — Telephone Encounter (Addendum)
Requested Prescriptions  Pending Prescriptions Disp Refills  . cloNIDine (CATAPRES) 0.1 MG tablet 90 tablet 0    Sig: Take 1 tablet (0.1 mg total) by mouth at bedtime.     Cardiovascular:  Alpha-2 Agonists Failed - 01/04/2021  2:14 PM      Failed - Valid encounter within last 6 months    Recent Outpatient Visits          6 months ago Cancer [facility])   [facility] Steele Sizer, MD   9 months ago Serous adenocarcinoma [facility])   [facility] Steele Sizer, MD   1 year ago Cancer [facility])   [facility] Steele Sizer, MD   1 year ago Mild episode of recurrent major depressive disorder [facility])   [facility] Steele Sizer, MD   2 years ago Mild episode of recurrent major depressive disorder [facility])   [facility] Steele Sizer, MD      Future Appointments            In 2 weeks Steele Sizer, MD [facility], [place] BP in normal range    BP Readings from Last 1 Encounters:  12/24/20 136/79         Passed - Last Heart Rate in normal range    Pulse Readings from Last 1 Encounters:  12/24/20 73         Need clarification on Catapres order. Pt has been prescribed both a patch of 2 different strengths and also oral Catapres. Orders are from both Dr. Ancil Boozer and [place] physician, Beckey Rutter. Please advise.

## 2021-01-04 NOTE — Telephone Encounter (Signed)
Pt called In to request a refill for cloNIDine (CATAPRES) 0.1 MG tablet- 90 day supply.    Pharmacy:  [facility] [place], [address] Phone:  [phone]  Fax:  [phone]

## 2021-01-05 ENCOUNTER — Other Ambulatory Visit: Payer: Self-pay

## 2021-01-05 NOTE — Telephone Encounter (Signed)
PT calling to f/up / please advise

## 2021-01-05 NOTE — Telephone Encounter (Signed)
Dr. B pt 

## 2021-01-06 ENCOUNTER — Telehealth: Payer: Self-pay

## 2021-01-06 ENCOUNTER — Other Ambulatory Visit: Payer: Self-pay

## 2021-01-06 ENCOUNTER — Other Ambulatory Visit: Payer: Self-pay | Admitting: Family Medicine

## 2021-01-06 NOTE — Telephone Encounter (Signed)
Pt calling to advise she has only the one pill left of her cloNIDine (CATAPRES) 0.1 MG tablet  Pt states she cannot run out of the medication.  [id number] - [place], [place]

## 2021-01-06 NOTE — Telephone Encounter (Signed)
Pt requesting refill on clonidine. She only have one pill left. Pt stated that you put her on the pill to take at night to help with the patch due to excessive night sweats. She did schedule appt for 1.20.2022. please send to [facility]

## 2021-01-06 NOTE — Telephone Encounter (Signed)
Please advise. Pt however does a ana appt sch for the 01-19-2021. Has no acal at this time for sooner appt

## 2021-01-07 ENCOUNTER — Inpatient Hospital Stay: Payer: Medicaid Other

## 2021-01-07 ENCOUNTER — Other Ambulatory Visit: Payer: Self-pay | Admitting: Family Medicine

## 2021-01-07 ENCOUNTER — Inpatient Hospital Stay (HOSPITAL_BASED_OUTPATIENT_CLINIC_OR_DEPARTMENT_OTHER): Payer: Medicaid Other | Admitting: Internal Medicine

## 2021-01-07 ENCOUNTER — Other Ambulatory Visit: Payer: Self-pay

## 2021-01-07 ENCOUNTER — Inpatient Hospital Stay: Payer: Medicaid Other | Attending: Internal Medicine

## 2021-01-07 DIAGNOSIS — Z95828 Presence of other vascular implants and grafts: Secondary | ICD-10-CM

## 2021-01-07 DIAGNOSIS — Z5112 Encounter for antineoplastic immunotherapy: Secondary | ICD-10-CM | POA: Insufficient documentation

## 2021-01-07 DIAGNOSIS — C482 Malignant neoplasm of peritoneum, unspecified: Secondary | ICD-10-CM | POA: Insufficient documentation

## 2021-01-07 LAB — CBC WITH DIFFERENTIAL/PLATELET
Abs Immature Granulocytes: 0.02 10*3/uL (ref 0.00–0.07)
Basophils Absolute: 0 10*3/uL (ref 0.0–0.1)
Basophils Relative: 1 %
Eosinophils Absolute: 0.1 10*3/uL (ref 0.0–0.5)
Eosinophils Relative: 2 %
HCT: 30.7 % — ABNORMAL LOW (ref 36.0–46.0)
Hemoglobin: 10 g/dL — ABNORMAL LOW (ref 12.0–15.0)
Immature Granulocytes: 0 %
Lymphocytes Relative: 32 %
Lymphs Abs: 1.7 10*3/uL (ref 0.7–4.0)
MCH: 31.9 pg (ref 26.0–34.0)
MCHC: 32.6 g/dL (ref 30.0–36.0)
MCV: 98.1 fL (ref 80.0–100.0)
Monocytes Absolute: 0.5 10*3/uL (ref 0.1–1.0)
Monocytes Relative: 8 %
Neutro Abs: 3.1 10*3/uL (ref 1.7–7.7)
Neutrophils Relative %: 57 %
Platelets: 148 10*3/uL — ABNORMAL LOW (ref 150–400)
RBC: 3.13 MIL/uL — ABNORMAL LOW (ref 3.87–5.11)
RDW: 17.4 % — ABNORMAL HIGH (ref 11.5–15.5)
Smear Review: NORMAL
WBC: 5.4 10*3/uL (ref 4.0–10.5)
nRBC: 0 % (ref 0.0–0.2)

## 2021-01-07 LAB — COMPREHENSIVE METABOLIC PANEL
ALT: 20 U/L (ref 0–44)
AST: 22 U/L (ref 15–41)
Albumin: 3.7 g/dL (ref 3.5–5.0)
Alkaline Phosphatase: 60 U/L (ref 38–126)
Anion gap: 9 (ref 5–15)
BUN: 12 mg/dL (ref 6–20)
CO2: 26 mmol/L (ref 22–32)
Calcium: 8.8 mg/dL — ABNORMAL LOW (ref 8.9–10.3)
Chloride: 101 mmol/L (ref 98–111)
Creatinine, Ser: 0.77 mg/dL (ref 0.44–1.00)
GFR, Estimated: >60 mL/min (ref >60)
Glucose, Bld: 129 mg/dL — ABNORMAL HIGH (ref 70–99)
Potassium: 3.6 mmol/L (ref 3.5–5.1)
Sodium: 136 mmol/L (ref 135–145)
Total Bilirubin: 0.1 mg/dL — ABNORMAL LOW (ref 0.3–1.2)
Total Protein: 7 g/dL (ref 6.5–8.1)

## 2021-01-07 MED ORDER — CLONIDINE HCL 0.1 MG PO TABS: 0.1000 mg | ORAL_TABLET | Freq: Every evening | ORAL | 0 refills | Status: DC

## 2021-01-07 MED ORDER — HEPARIN SOD (PORK) LOCK FLUSH 100 UNIT/ML IV SOLN
500.0000 [IU] | Freq: Once | INTRAVENOUS | Status: AC
  Administered 2021-01-07: 500 [IU]
  Filled 2021-01-07: qty 5

## 2021-01-07 MED ORDER — OXYCODONE HCL 5 MG PO TABS: 5.0000 mg | ORAL_TABLET | Freq: Three times a day (TID) | ORAL | 0 refills | Status: DC | PRN

## 2021-01-07 NOTE — Assessment & Plan Note (Addendum)
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 STABLE;;  tumor markers- improving.   # proceed with  Bev [on Lymparza 250 mg BID] next week [scheduling error today; due for next week]. Labs today reviewed;  acceptable for treatment today- platelets-148  # Severe recurrent anemia-on lyparza 250 mg BID [Hb 10 today]. STABLE.   #Left breast 9 mm lesion MRI surveillance-biopsy apocrine metaplasia/benign.  Will need surveillance MRI on a yearly basis given BRCA positive.   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   # Knee pain right/right hip- no trauma/ worse-likely arthritic s/p orthopedics eval; awaiting PT- stable.   #Abdominal swelling-ventral hernia-symptomatic-Awaiting evaluation with Dr.Byrnett.   # DISPOSITION:  #  HOLD avstatin today # in 1 week- AVASTIN; UA.  #Follow-up in 4 weeks- days; -MD labs-CBC CMP; ca-125; UA; Avastin- Dr.B
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
## 2021-01-07 NOTE — Telephone Encounter (Signed)
Patient needs RF on oxycodone

## 2021-01-09 LAB — CANCER ANTIGEN 27.29: CA 27.29: 23 U/mL (ref 0.0–38.6)

## 2021-01-12 NOTE — Telephone Encounter (Signed)
LVM TO GIVE PT MESSAGE

## 2021-01-13 NOTE — Progress Notes (Signed)
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 Alto [place]. She is having some spotting, night sweats are severe, on Clonidine 0.3 mg patch and  clonidine 0.1 mg qhs , she states hot flashes has been under control now  Already on duloxetine She has neuropathy on both hands and feet , not causing falls or lack of balance, but she is tired of the pain, she is on lyrica, she has daily pain and would like to go up on dose of Lyrica, pain can be up to 9/10   Insomnia: she is back on Trazodone but still has interrupted sleep and feels tired all the time, we will check sleep study   Cardiomegaly: on CT scan, interrupted sleep, may have sleep apnea, echo was negative for cardiomegaly   Migraines: she stopped topamax, taking imitrex prn, she denies recent episodes of migraine   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, she is back on Duloxetine since diagnosed with cancer Dec 2020. Phq 9 is up again. She states tired of being sick . She is also upset about her weight, she wants to move but depression has anhedonia and also leg pains She wonders if having cancer is punishment to how she was as a child. Discussed referral to psychiatrist and therapist , we will increase dose of duloxetine and add buspirone for now   Ventral hernia: incidental finding on CT done 12/28/2020, she is going to see surgeon in Feb.    Patient Active Problem List   Diagnosis  Date Noted  . 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 Arm vein blood clot, right 03/11/2020  . Malignant ascites 12/22/2019  . Acute dyspnea 12/22/2019  . Pleural effusion on right 12/22/2019  . Tachycardia 12/22/2019  . Primary peritoneal carcinomatosis ([place]) 12/16/2019  . Goals of care, counseling/discussion 12/16/2019  . Serous adenocarcinoma ([place]) 12/11/2019  . Erythrocytosis 11/12/2019  . Morbid obesity with BMI of 40.0-44.9, adult ([place]) 07/07/2018  . 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 Fever blister 05/17/2018  . Family history of breast cancer 05/08/2018  . Family history of colon cancer in mother 05/08/2018  . Migraine with aura and without status migrainosus 04/18/2018  . Dysmenorrhea 06/21/2017  . Menorrhagia 06/21/2017  . Mild recurrent major depression ([place]) 01/20/2016  . Esophageal reflux 01/20/2016  . Osteoarthritis, multiple sites 01/20/2016  . Frequent headaches 01/20/2016    Past Surgical History:  Procedure Laterality Date  . ABDOMINAL HYSTERECTOMY  03/2020  . APPENDECTOMY     LSC but "ruptured when they did the surgery"  . BREAST BIOPSY Left 01/04/2021   MRI BX  . CESAREAN SECTION    . CYSTOSCOPY N/A 04/08/2020   Procedure: CYSTOSCOPY;  [name], MD;  [name];  Service: Gynecology;  [name];  . IR THORACENTESIS ASP PLEURAL SPACE W/IMG GUIDE  12/06/2019  . IUD REMOVAL N/A 04/08/2020  Procedure: INTRAUTERINE DEVICE (IUD) REMOVAL;  [name], MD;  [name];  Service: Gynecology;  [name];  . PARACENTESIS     x6  . PORTA CATH INSERTION N/A 04/23/2020   Procedure: PORTA CATH INSERTION;  [name], MD;  Location: [facility];  Service: Cardiovascular;  [name];  . TUBAL LIGATION     at time of CSxn  . WRIST SURGERY Left 11/21/2016   plates and screws inserted    Family History  Adopted: Yes  Problem Relation Age of Onset  . Lung cancer Father         deceased 30  . Breast cancer Mother 68       currently 52  . Colon cancer Mother   . ADD / ADHD Son   . ADD / ADHD Son   . Early death Maternal Aunt   . Breast cancer Maternal Aunt 34       deceased 11  . Breast cancer Maternal Grandmother   . Depression Daughter   . Depression Daughter   . Prostate cancer Paternal Uncle   . Stroke Paternal Uncle   . Leukemia Paternal Aunt   . Breast cancer Paternal Grandmother   . Cancer Maternal Uncle     Social History   Tobacco Use  . Smoking status: Current Every Day Smoker    Packs/day: 0.50    Years: 30.00    Pack years: 15.00    Types: Cigarettes  . Smokeless tobacco: Never Used  Substance Use Topics  . Alcohol use: Not Currently    Alcohol/week: 0.0 standard drinks     Current Outpatient Medications:  .  ALPRAZolam (XANAX) 0.25 MG tablet, Take 1 tablet (0.25 mg total) by mouth 2 (two) times daily as needed for anxiety. Please do not drive while on the medication- as this can cause dizziness., Disp: 30 tablet, Rfl: 0 .  B Complex-C (B-COMPLEX WITH VITAMIN C) tablet, Take 1 tablet by mouth daily., Disp: , Rfl:  .  Biotin w/ Vitamins C & E (HAIR/SKIN/NAILS PO), Take 1 tablet by mouth daily., Disp: , Rfl:  .  cloNIDine (CATAPRES - DOSED IN MG/24 HR) 0.3 mg/24hr patch, Place 1 patch (0.3 mg total) onto the skin once a week., Disp: 4 patch, Rfl: 5 .  cloNIDine (CATAPRES) 0.1 MG tablet, Take 1 tablet (0.1 mg total) by mouth at bedtime., Disp: 30 tablet, Rfl: 0 .  DULoxetine (CYMBALTA) 60 MG capsule, Take 1 capsule (60 mg total) by mouth at bedtime., Disp: 90 capsule, Rfl: 1 .  lidocaine-prilocaine (EMLA) cream, Apply 1 application topically as needed., Disp: 30 g, Rfl: 3 .  loratadine (CLARITIN) 10 MG tablet, TAKE 1 TABLET(10 MG) BY MOUTH EVERY MORNING, Disp: 90 tablet, Rfl: 3 .  Magnesium 400 MG CAPS, , Disp: , Rfl:  .  metoprolol succinate (TOPROL-XL) 25 MG 24 hr tablet, TAKE 1/2 TABLET(12.5 MG) BY MOUTH DAILY, Disp: 45 tablet,  Rfl: 1 .  Multiple Vitamin (MULTIVITAMIN WITH MINERALS) TABS tablet, Take 1 tablet by mouth daily., Disp: , Rfl:  .  nicotine (NICODERM CQ - DOSED IN MG/24 HOURS) 14 mg/24hr patch, Place 1 patch (14 mg total) onto the skin daily., Disp: 28 patch, Rfl: 0 .  olaparib (LYNPARZA) 100 MG tablet, Take 1 pill twice a day [along with 198m pill- Total 250 mg twice a day]. Swallow whole. May take with food to decrease nausea and vomiting. DO NOT START UNTIL EVALUATED BY MD., Disp: 671  tablet, Rfl: 3 .  olaparib (LYNPARZA) 150 MG tablet, Take 1 pill twice a day [along with 100 mg pill- Total 250 mg twice a day]. Swallow whole. May take with food to decrease nausea and vomiting. DO NOT START UNTIL EVALUATED BY MD., Disp: 60 tablet, Rfl: 3 .  omeprazole (PRILOSEC) 20 MG capsule, TAKE 1 CAPSULE(20 MG) BY MOUTH DAILY, Disp: 90 capsule, Rfl: 1 .  ondansetron (ZOFRAN) 8 MG tablet, One pill every 8 hours as needed for nausea/vomitting., Disp: 40 tablet, Rfl: 1 .  oxyCODONE (OXY IR/ROXICODONE) 5 MG immediate release tablet, Take 1-2 tablets (5-10 mg total) by mouth every 8 (eight) hours as needed for severe pain., Disp: 60 tablet, Rfl: 0 .  pregabalin (LYRICA) 50 MG capsule, Take 1 capsule (50 mg total) by mouth 3 (three) times daily., Disp: 90 capsule, Rfl: 2 .  prochlorperazine (COMPAZINE) 10 MG tablet, Take 1 tablet (10 mg total) by mouth every 6 (six) hours as needed for nausea or vomiting., Disp: 40 tablet, Rfl: 1 .  sennosides-docusate sodium (SENOKOT-S) 8.6-50 MG tablet, Take 1 tablet by mouth at bedtime., Disp: , Rfl:  .  SUMAtriptan (IMITREX) 100 MG tablet, Take 1 tablet (100 mg total) by mouth every 2 (two) hours as needed for migraine. May repeat in 2 hours if headache persists or recurs., Disp: 10 tablet, Rfl: 0 .  traZODone (DESYREL) 50 MG tablet, Take 0.5-1 tablets (25-50 mg total) by mouth at bedtime as needed for sleep., Disp: 30 tablet, Rfl: 2 .  valACYclovir (VALTREX) 1000 MG tablet, Take 1 tablet (1,000  mg total) by mouth 2 (two) times daily as needed (outbreak)., Disp: 6 tablet, Rfl: 0 No current facility-administered medications for this visit.  Facility-Administered Medications Ordered in Other Visits:  .  sodium chloride flush (NS) 0.9 % injection 10 mL, 10 mL, Intravenous, Once, Rogue Bussing, Elisha Headland, MD  No Known Allergies  I personally reviewed active problem list, medication list, allergies, family history, social history, health maintenance, notes from last encounter, notes from last several encounters with the patient/caregiver today.   ROS  Constitutional: Negative for fever , but has positive weight change.  Respiratory: Negative for cough and shortness of breath.   Cardiovascular: Negative for chest pain or palpitations.  Gastrointestinal: Negative for abdominal pain, no bowel changes.  Musculoskeletal: Negative for gait problem or joint swelling.  Skin: Negative for rash.  Neurological: Negative for dizziness or headache.  No other specific complaints in a complete review of systems (except as listed in HPI above).  Objective  Vitals:   01/14/21 1146  BP: 138/86  Pulse: 100  Resp: 16  Temp: 97.9 F (36.6 C)  TempSrc: Oral  SpO2: 99%  Weight: 273 lb 12.8 oz (124.2 kg)  Height: 5' 3" (1.6 m)    Body mass index is 48.5 kg/m.  Physical Exam  Constitutional: Patient appears well-developed and well-nourished. Obese  No distress.  HEENT: head atraumatic, normocephalic, pupils equal and reactive to light,  neck supple Cardiovascular: Normal rate, regular rhythm and normal heart sounds.  No murmur heard. No BLE edema. Pulmonary/Chest: Effort normal and breath sounds normal. No respiratory distress. Abdominal: Soft.  There is no tenderness. Psychiatric: Patient has a depressed mood l. Judgment and thought content normal.   PHQ2/9: Depression screen [facility] 2/9 01/14/2021 06/09/2020 03/24/2020 12/10/2019  Decreased Interest 2 0 0 1  Down, Depressed, Hopeless 2 0 0 2   PHQ - 2 Score 4 0 0 3  Altered sleeping 2 1 0  1  Tired, decreased energy 2 0 0 1  Change in appetite 1 0 0 1  Feeling bad or failure about yourself  2 0 0 0  Trouble concentrating 0 0 0 1  Moving slowly or fidgety/restless 0 0 0 0  Suicidal thoughts 0 0 0 0  PHQ-9 Score 11 1 0 7  Difficult doing work/chores - Not difficult at all - Somewhat difficult  Some recent data might be hidden    phq 9 is positive   Fall Risk: Fall Risk  01/14/2021 06/09/2020 03/24/2020 12/10/2019 10/25/2019  Falls in the past year? 0 0 0 0 0  Number falls in past yr: 0 - 0 0 0  Injury with Fall? 0 - 0 0 0      Functional Status Survey: Is the patient deaf or have difficulty hearing?: No Does the patient have difficulty seeing, even when wearing glasses/contacts?: No Does the patient have difficulty concentrating, remembering, or making decisions?: Yes Does the patient have difficulty walking or climbing stairs?: Yes Does the patient have difficulty dressing or bathing?: Yes Does the patient have difficulty doing errands alone such as visiting a doctor's office or shopping?: No   Assessment & Plan  1. Colon cancer screening  She wants to hold off for now, consider at age 68   2. Need for Tdap vaccination  - Tdap vaccine greater than or equal to 7yo IM  3. Thrombocytopenia ([place])   4. Moderate episode of recurrent major depressive disorder (HCC)  - DULoxetine (CYMBALTA) 60 MG capsule; Take 2 capsules (120 mg total) by mouth at bedtime.  Dispense: 180 capsule; Refill: 0 - busPIRone (BUSPAR) 5 MG tablet; Take 1-2 tablets (5-10 mg total) by mouth 3 (three) times daily as needed.  Dispense: 60 tablet; Refill: 0 - Ambulatory referral to Psychiatry - TSH  5. DDD (degenerative disc disease), lumbar  - DULoxetine (CYMBALTA) 60 MG capsule; Take 2 capsules (120 mg total) by mouth at bedtime.  Dispense: 180 capsule; Refill: 0  6. Surgical menopause, symptomatic  - cloNIDine (CATAPRES) 0.1 MG  tablet; Take 1 tablet (0.1 mg total) by mouth at bedtime.  Dispense: 90 tablet; Refill: 0  7. Snoring  - Ambulatory referral to Sleep Studies  8. Morbid obesity ([place])  - Ambulatory referral to Sleep Studies  9. Lipid screening  - Lipid panel  10. Diabetes mellitus screening  - Hemoglobin A1c  11. Chemotherapy-induced neuropathy (HCC)  - pregabalin (LYRICA) 75 MG capsule; Take 1 capsule (75 mg total) by mouth 3 (three) times daily.  Dispense: 270 capsule; Refill: 0

## 2021-01-14 ENCOUNTER — Other Ambulatory Visit: Payer: Self-pay

## 2021-01-14 ENCOUNTER — Other Ambulatory Visit: Payer: Self-pay | Admitting: Internal Medicine

## 2021-01-14 ENCOUNTER — Encounter: Payer: Self-pay | Admitting: Family Medicine

## 2021-01-14 ENCOUNTER — Ambulatory Visit: Payer: Medicaid Other | Admitting: Family Medicine

## 2021-01-14 ENCOUNTER — Inpatient Hospital Stay: Payer: Medicaid Other

## 2021-01-14 VITALS — BP 138/86 | HR 100 | Temp 97.9°F | Resp 16 | Ht 63.0 in | Wt 273.8 lb

## 2021-01-14 VITALS — BP 117/79 | HR 76 | Temp 97.2°F | Resp 20 | Wt 274.6 lb

## 2021-01-14 DIAGNOSIS — E8941 Symptomatic postprocedural ovarian failure: Secondary | ICD-10-CM | POA: Diagnosis not present

## 2021-01-14 DIAGNOSIS — D696 Thrombocytopenia, unspecified: Secondary | ICD-10-CM | POA: Diagnosis not present

## 2021-01-14 DIAGNOSIS — Z131 Encounter for screening for diabetes mellitus: Secondary | ICD-10-CM

## 2021-01-14 DIAGNOSIS — C482 Malignant neoplasm of peritoneum, unspecified: Secondary | ICD-10-CM

## 2021-01-14 DIAGNOSIS — Z1211 Encounter for screening for malignant neoplasm of colon: Secondary | ICD-10-CM | POA: Diagnosis not present

## 2021-01-14 DIAGNOSIS — Z7189 Other specified counseling: Secondary | ICD-10-CM

## 2021-01-14 DIAGNOSIS — Z13220 Encounter for screening for lipoid disorders: Secondary | ICD-10-CM | POA: Diagnosis not present

## 2021-01-14 DIAGNOSIS — T451X5A Adverse effect of antineoplastic and immunosuppressive drugs, initial encounter: Secondary | ICD-10-CM

## 2021-01-14 DIAGNOSIS — F331 Major depressive disorder, recurrent, moderate: Secondary | ICD-10-CM

## 2021-01-14 DIAGNOSIS — M5136 Other intervertebral disc degeneration, lumbar region: Secondary | ICD-10-CM | POA: Diagnosis not present

## 2021-01-14 DIAGNOSIS — R0683 Snoring: Secondary | ICD-10-CM | POA: Diagnosis not present

## 2021-01-14 DIAGNOSIS — Z23 Encounter for immunization: Secondary | ICD-10-CM

## 2021-01-14 DIAGNOSIS — Z5112 Encounter for antineoplastic immunotherapy: Secondary | ICD-10-CM | POA: Diagnosis not present

## 2021-01-14 DIAGNOSIS — G620 Drug-induced polyneuropathy: Secondary | ICD-10-CM | POA: Diagnosis not present

## 2021-01-14 DIAGNOSIS — M51369 Other intervertebral disc degeneration, lumbar region without mention of lumbar back pain or lower extremity pain: Secondary | ICD-10-CM

## 2021-01-14 LAB — URINALYSIS, COMPLETE (UACMP) WITH MICROSCOPIC
Bilirubin Urine: NEGATIVE
Glucose, UA: NEGATIVE mg/dL
Hgb urine dipstick: NEGATIVE
Ketones, ur: NEGATIVE mg/dL
Nitrite: NEGATIVE
Protein, ur: NEGATIVE mg/dL
Specific Gravity, Urine: 1.025 (ref 1.005–1.030)
pH: 5 (ref 5.0–8.0)

## 2021-01-14 MED ORDER — SODIUM CHLORIDE 0.9 % IV SOLN
Freq: Once | INTRAVENOUS | Status: AC
Start: 2021-01-14 — End: 2021-01-14
  Filled 2021-01-14: qty 250

## 2021-01-14 MED ORDER — SODIUM CHLORIDE 0.9 % IV SOLN: 4.0000 mg | Freq: Once | INTRAVENOUS | Status: DC

## 2021-01-14 MED ORDER — BUSPIRONE HCL 5 MG PO TABS
5.0000 mg | ORAL_TABLET | Freq: Three times a day (TID) | ORAL | 0 refills | Status: DC | PRN
Start: 2021-01-14 — End: 2021-07-12

## 2021-01-14 MED ORDER — DEXAMETHASONE SODIUM PHOSPHATE 10 MG/ML IJ SOLN
4.0000 mg | Freq: Once | INTRAMUSCULAR | Status: AC
  Administered 2021-01-14: 4 mg via INTRAVENOUS
  Filled 2021-01-14: qty 1

## 2021-01-14 MED ORDER — HEPARIN SOD (PORK) LOCK FLUSH 100 UNIT/ML IV SOLN
INTRAVENOUS | Status: AC
  Filled 2021-01-14: qty 5

## 2021-01-14 MED ORDER — PREGABALIN 75 MG PO CAPS: 75.0000 mg | ORAL_CAPSULE | Freq: Three times a day (TID) | ORAL | 0 refills | Status: DC

## 2021-01-14 MED ORDER — SODIUM CHLORIDE 0.9% FLUSH
10.0000 mL | INTRAVENOUS | Status: DC | PRN
  Filled 2021-01-14: qty 10

## 2021-01-14 MED ORDER — HEPARIN SOD (PORK) LOCK FLUSH 100 UNIT/ML IV SOLN
500.0000 [IU] | Freq: Once | INTRAVENOUS | Status: AC | PRN
  Administered 2021-01-14: 500 [IU]
  Filled 2021-01-14: qty 5

## 2021-01-14 MED ORDER — SODIUM CHLORIDE 0.9 % IV SOLN
15.0000 mg/kg | Freq: Once | INTRAVENOUS | Status: AC
  Administered 2021-01-14: 1700 mg via INTRAVENOUS
  Filled 2021-01-14: qty 64

## 2021-01-14 MED ORDER — CLONIDINE HCL 0.1 MG PO TABS
0.1000 mg | ORAL_TABLET | Freq: Every evening | ORAL | 0 refills | Status: DC
Start: 2021-01-14 — End: 2021-05-20

## 2021-01-14 MED ORDER — DULOXETINE HCL 60 MG PO CPEP: 120.0000 mg | ORAL_CAPSULE | Freq: Every day | ORAL | 0 refills | Status: DC

## 2021-01-14 NOTE — Progress Notes (Signed)
Per Dr. Rogue Bussing, no labs were drawn today.  Lab results from last week are adequate for treatment.  Urine collected as ordered, but okay to proceed without results today per Dr. Rogue Bussing.  Pt tolerated infusion well with no complaints reported.  Pt left infusion suite stable and ambulatory.

## 2021-01-15 LAB — LIPID PANEL
Cholesterol: 219 mg/dL — ABNORMAL HIGH (ref <200)
HDL: 46 mg/dL — ABNORMAL LOW (ref >=50)
LDL Cholesterol (Calc): 131 mg/dL (calc) — ABNORMAL HIGH
Non-HDL Cholesterol (Calc): 173 mg/dL (calc) — ABNORMAL HIGH (ref <130)
Total CHOL/HDL Ratio: 4.8 (calc) (ref <5.0)
Triglycerides: 294 mg/dL — ABNORMAL HIGH (ref <150)

## 2021-01-15 LAB — HEMOGLOBIN A1C
Hgb A1c MFr Bld: 6.2 % of total Hgb — ABNORMAL HIGH (ref <5.7)
Mean Plasma Glucose: 131 mg/dL
eAG (mmol/L): 7.3 mmol/L

## 2021-01-15 LAB — TSH: TSH: 9.86 mIU/L — ABNORMAL HIGH

## 2021-01-17 ENCOUNTER — Other Ambulatory Visit: Payer: Self-pay | Admitting: Family Medicine

## 2021-01-17 DIAGNOSIS — R7989 Other specified abnormal findings of blood chemistry: Secondary | ICD-10-CM

## 2021-01-19 ENCOUNTER — Ambulatory Visit: Payer: Medicaid Other | Admitting: Family Medicine

## 2021-01-26 DIAGNOSIS — C569 Malignant neoplasm of unspecified ovary: Secondary | ICD-10-CM | POA: Diagnosis not present

## 2021-01-26 DIAGNOSIS — K439 Ventral hernia without obstruction or gangrene: Secondary | ICD-10-CM | POA: Diagnosis not present

## 2021-01-26 MED FILL — LYNPARZA 100 MG TAB: 100 | 30 days supply | Qty: 60 | Fill #2

## 2021-02-04 ENCOUNTER — Other Ambulatory Visit: Payer: Self-pay | Admitting: *Deleted

## 2021-02-04 DIAGNOSIS — C482 Malignant neoplasm of peritoneum, unspecified: Secondary | ICD-10-CM

## 2021-02-05 ENCOUNTER — Inpatient Hospital Stay: Payer: Medicaid Other | Attending: Internal Medicine

## 2021-02-05 ENCOUNTER — Inpatient Hospital Stay (HOSPITAL_BASED_OUTPATIENT_CLINIC_OR_DEPARTMENT_OTHER): Payer: Medicaid Other | Admitting: Internal Medicine

## 2021-02-05 ENCOUNTER — Inpatient Hospital Stay: Payer: Medicaid Other

## 2021-02-05 ENCOUNTER — Encounter: Payer: Self-pay | Admitting: Internal Medicine

## 2021-02-05 VITALS — BP 122/84 | HR 73 | Temp 98.2°F | Resp 16 | Ht 63.0 in | Wt 271.0 lb

## 2021-02-05 DIAGNOSIS — C482 Malignant neoplasm of peritoneum, unspecified: Secondary | ICD-10-CM | POA: Insufficient documentation

## 2021-02-05 DIAGNOSIS — Z7189 Other specified counseling: Secondary | ICD-10-CM

## 2021-02-05 DIAGNOSIS — F17210 Nicotine dependence, cigarettes, uncomplicated: Secondary | ICD-10-CM | POA: Diagnosis not present

## 2021-02-05 DIAGNOSIS — Z5112 Encounter for antineoplastic immunotherapy: Secondary | ICD-10-CM | POA: Insufficient documentation

## 2021-02-05 DIAGNOSIS — Z95828 Presence of other vascular implants and grafts: Secondary | ICD-10-CM | POA: Diagnosis not present

## 2021-02-05 LAB — COMPREHENSIVE METABOLIC PANEL
ALT: 17 U/L (ref 0–44)
AST: 20 U/L (ref 15–41)
Albumin: 3.7 g/dL (ref 3.5–5.0)
Alkaline Phosphatase: 58 U/L (ref 38–126)
Anion gap: 9 (ref 5–15)
BUN: 18 mg/dL (ref 6–20)
CO2: 24 mmol/L (ref 22–32)
Calcium: 9.2 mg/dL (ref 8.9–10.3)
Chloride: 105 mmol/L (ref 98–111)
Creatinine, Ser: 0.83 mg/dL (ref 0.44–1.00)
GFR, Estimated: >60 mL/min (ref >60)
Glucose, Bld: 108 mg/dL — ABNORMAL HIGH (ref 70–99)
Potassium: 4 mmol/L (ref 3.5–5.1)
Sodium: 138 mmol/L (ref 135–145)
Total Bilirubin: 0.5 mg/dL (ref 0.3–1.2)
Total Protein: 7.2 g/dL (ref 6.5–8.1)

## 2021-02-05 LAB — CBC WITH DIFFERENTIAL/PLATELET
Abs Immature Granulocytes: 0.01 10*3/uL (ref 0.00–0.07)
Basophils Absolute: 0.1 10*3/uL (ref 0.0–0.1)
Basophils Relative: 1 %
Eosinophils Absolute: 0.1 10*3/uL (ref 0.0–0.5)
Eosinophils Relative: 2 %
HCT: 22.6 % — ABNORMAL LOW (ref 36.0–46.0)
Hemoglobin: 7.5 g/dL — ABNORMAL LOW (ref 12.0–15.0)
Immature Granulocytes: 0 %
Lymphocytes Relative: 37 %
Lymphs Abs: 1.6 10*3/uL (ref 0.7–4.0)
MCH: 31.9 pg (ref 26.0–34.0)
MCHC: 33.2 g/dL (ref 30.0–36.0)
MCV: 96.2 fL (ref 80.0–100.0)
Monocytes Absolute: 0.4 10*3/uL (ref 0.1–1.0)
Monocytes Relative: 8 %
Neutro Abs: 2.2 10*3/uL (ref 1.7–7.7)
Neutrophils Relative %: 52 %
Platelets: 117 10*3/uL — ABNORMAL LOW (ref 150–400)
RBC: 2.35 MIL/uL — ABNORMAL LOW (ref 3.87–5.11)
RDW: 19 % — ABNORMAL HIGH (ref 11.5–15.5)
Smear Review: NORMAL
WBC: 4.3 10*3/uL (ref 4.0–10.5)
nRBC: 0 % (ref 0.0–0.2)

## 2021-02-05 LAB — URINALYSIS, COMPLETE (UACMP) WITH MICROSCOPIC
Bacteria, UA: NONE SEEN
Bilirubin Urine: NEGATIVE
Glucose, UA: NEGATIVE mg/dL
Hgb urine dipstick: NEGATIVE
Ketones, ur: NEGATIVE mg/dL
Nitrite: NEGATIVE
Protein, ur: NEGATIVE mg/dL
Specific Gravity, Urine: 1.025 (ref 1.005–1.030)
pH: 5 (ref 5.0–8.0)

## 2021-02-05 MED ORDER — SODIUM CHLORIDE 0.9 % IV SOLN
Freq: Once | INTRAVENOUS | Status: AC
  Filled 2021-02-05: qty 250

## 2021-02-05 MED ORDER — HEPARIN SOD (PORK) LOCK FLUSH 100 UNIT/ML IV SOLN
500.0000 [IU] | Freq: Once | INTRAVENOUS | Status: AC | PRN
  Administered 2021-02-05: 500 [IU]
  Filled 2021-02-05: qty 5

## 2021-02-05 MED ORDER — SODIUM CHLORIDE 0.9 % IV SOLN
15.0000 mg/kg | Freq: Once | INTRAVENOUS | Status: AC
  Administered 2021-02-05: 1700 mg via INTRAVENOUS
  Filled 2021-02-05: qty 64

## 2021-02-05 MED ORDER — SODIUM CHLORIDE 0.9 % IV SOLN: 4.0000 mg | Freq: Once | INTRAVENOUS | Status: DC

## 2021-02-05 MED ORDER — DEXAMETHASONE SODIUM PHOSPHATE 10 MG/ML IJ SOLN
4.0000 mg | Freq: Once | INTRAMUSCULAR | Status: AC
  Administered 2021-02-05: 4 mg via INTRAVENOUS
  Filled 2021-02-05: qty 1

## 2021-02-05 MED ORDER — HEPARIN SOD (PORK) LOCK FLUSH 100 UNIT/ML IV SOLN
INTRAVENOUS | Status: AC
  Filled 2021-02-05: qty 5

## 2021-02-05 NOTE — Assessment & Plan Note (Addendum)
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 STABLE. ;  tumor markers- improving.   # proceed with  Bev [on Lymparza 250 mg BID]. Labs today reviewed;  acceptable for treatment today except for continueed anemia-   # Severe recurrent anemia-on lyparza 250 mg BID [Hb 7.5;today]. clinically STABLE; continue current dose. Plan weekly cbc.    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   # Knee pain right/right hip- no trauma/ worse-likely arthritic s/p orthopedics eval; awaiting PT- stable.   #Abdominal swelling-ventral hernia-symptomatic-s/p evaluation with Dr.Byrnett- recommend quittig smoking prior to surgery. On nicontine gum.   #IV access/Mediport-question malfunction.  But repeat improved.  Hold off port study.  Okay with Avastin through Mediport.  # DISPOSITION:  # weekly cbc x3  # asvatin  #Follow-up in 3 weeks- days; -MD labs-CBC CMP; ca-125; UA; Avastin- Dr.B

## 2021-02-05 NOTE — Progress Notes (Signed)
Pt stable at discharge.  
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
## 2021-02-06 LAB — CA 125: Cancer Antigen (CA) 125: 6.9 U/mL (ref 0.0–38.1)

## 2021-02-09 ENCOUNTER — Other Ambulatory Visit: Payer: Self-pay | Admitting: *Deleted

## 2021-02-09 MED ORDER — OXYCODONE HCL 5 MG PO TABS: 5.0000 mg | ORAL_TABLET | Freq: Three times a day (TID) | ORAL | 0 refills | Status: DC | PRN

## 2021-02-09 MED ORDER — ALPRAZOLAM 0.25 MG PO TABS: 0.2500 mg | ORAL_TABLET | Freq: Two times a day (BID) | ORAL | 0 refills | Status: DC | PRN

## 2021-02-11 ENCOUNTER — Ambulatory Visit: Payer: Medicaid Other

## 2021-02-12 ENCOUNTER — Inpatient Hospital Stay: Payer: Medicaid Other

## 2021-02-17 ENCOUNTER — Inpatient Hospital Stay (HOSPITAL_COMMUNITY): Payer: Medicaid Other

## 2021-02-17 ENCOUNTER — Telehealth: Payer: Self-pay | Admitting: Oncology

## 2021-02-17 ENCOUNTER — Telehealth: Payer: Self-pay | Admitting: *Deleted

## 2021-02-17 ENCOUNTER — Other Ambulatory Visit: Payer: Self-pay

## 2021-02-17 ENCOUNTER — Other Ambulatory Visit (HOSPITAL_COMMUNITY): Payer: Medicaid Other

## 2021-02-17 ENCOUNTER — Emergency Department (HOSPITAL_COMMUNITY)
Admission: EM | Admit: 2021-02-17 | Discharge: 2021-02-17 | Disposition: A | Discharge status: Home / Self Care | Payer: Medicaid Other | Attending: Emergency Medicine | Admitting: Emergency Medicine

## 2021-02-17 ENCOUNTER — Encounter (HOSPITAL_COMMUNITY): Payer: Self-pay

## 2021-02-17 VITALS — BP 130/57 | HR 80 | Temp 97.0°F | Resp 18

## 2021-02-17 DIAGNOSIS — Z79899 Other long term (current) drug therapy: Secondary | ICD-10-CM | POA: Insufficient documentation

## 2021-02-17 DIAGNOSIS — F17210 Nicotine dependence, cigarettes, uncomplicated: Secondary | ICD-10-CM | POA: Diagnosis not present

## 2021-02-17 DIAGNOSIS — D649 Anemia, unspecified: Secondary | ICD-10-CM | POA: Insufficient documentation

## 2021-02-17 DIAGNOSIS — Z85118 Personal history of other malignant neoplasm of bronchus and lung: Secondary | ICD-10-CM | POA: Diagnosis not present

## 2021-02-17 DIAGNOSIS — Z8543 Personal history of malignant neoplasm of ovary: Secondary | ICD-10-CM | POA: Diagnosis not present

## 2021-02-17 DIAGNOSIS — E860 Dehydration: Secondary | ICD-10-CM

## 2021-02-17 DIAGNOSIS — I10 Essential (primary) hypertension: Secondary | ICD-10-CM | POA: Insufficient documentation

## 2021-02-17 DIAGNOSIS — Z5112 Encounter for antineoplastic immunotherapy: Secondary | ICD-10-CM | POA: Diagnosis not present

## 2021-02-17 LAB — CBC WITH DIFFERENTIAL/PLATELET
Abs Immature Granulocytes: 0.01 10*3/uL (ref 0.00–0.07)
Basophils Absolute: 0 10*3/uL (ref 0.0–0.1)
Basophils Relative: 1 %
Eosinophils Absolute: 0.1 10*3/uL (ref 0.0–0.5)
Eosinophils Relative: 2 %
HCT: 18.3 % — ABNORMAL LOW (ref 36.0–46.0)
Hemoglobin: 5.9 g/dL — CL (ref 12.0–15.0)
Immature Granulocytes: 0 %
Lymphocytes Relative: 37 %
Lymphs Abs: 1.4 10*3/uL (ref 0.7–4.0)
MCH: 31.9 pg (ref 26.0–34.0)
MCHC: 32.2 g/dL (ref 30.0–36.0)
MCV: 98.9 fL (ref 80.0–100.0)
Monocytes Absolute: 0.3 10*3/uL (ref 0.1–1.0)
Monocytes Relative: 8 %
Neutro Abs: 2.1 10*3/uL (ref 1.7–7.7)
Neutrophils Relative %: 52 %
Platelets: 115 10*3/uL — ABNORMAL LOW (ref 150–400)
RBC: 1.85 MIL/uL — ABNORMAL LOW (ref 3.87–5.11)
RDW: 19.5 % — ABNORMAL HIGH (ref 11.5–15.5)
WBC: 3.9 10*3/uL — ABNORMAL LOW (ref 4.0–10.5)
nRBC: 0 % (ref 0.0–0.2)

## 2021-02-17 LAB — RETICULOCYTES
Immature Retic Fract: 13.9 % (ref 2.3–15.9)
RBC.: 1.69 MIL/uL — ABNORMAL LOW (ref 3.87–5.11)
Retic Count, Absolute: 12.5 10*3/uL — ABNORMAL LOW (ref 19.0–186.0)
Retic Ct Pct: 0.7 % (ref 0.4–3.1)

## 2021-02-17 LAB — IRON AND TIBC
Iron: 273 ug/dL — ABNORMAL HIGH (ref 28–170)
Saturation Ratios: 84 % — ABNORMAL HIGH (ref 10.4–31.8)
TIBC: 323 ug/dL (ref 250–450)
UIBC: 50 ug/dL

## 2021-02-17 LAB — COMPREHENSIVE METABOLIC PANEL
ALT: 18 U/L (ref 0–44)
AST: 20 U/L (ref 15–41)
Albumin: 3.5 g/dL (ref 3.5–5.0)
Alkaline Phosphatase: 50 U/L (ref 38–126)
Anion gap: 7 (ref 5–15)
BUN: 18 mg/dL (ref 6–20)
CO2: 22 mmol/L (ref 22–32)
Calcium: 8.8 mg/dL — ABNORMAL LOW (ref 8.9–10.3)
Chloride: 105 mmol/L (ref 98–111)
Creatinine, Ser: 0.71 mg/dL (ref 0.44–1.00)
GFR, Estimated: >60 mL/min (ref >60)
Glucose, Bld: 105 mg/dL — ABNORMAL HIGH (ref 70–99)
Potassium: 3.8 mmol/L (ref 3.5–5.1)
Sodium: 134 mmol/L — ABNORMAL LOW (ref 135–145)
Total Bilirubin: 0.5 mg/dL (ref 0.3–1.2)
Total Protein: 6.9 g/dL (ref 6.5–8.1)

## 2021-02-17 LAB — HEMOGLOBIN AND HEMATOCRIT, BLOOD
HCT: 23.9 % — ABNORMAL LOW (ref 36.0–46.0)
Hemoglobin: 7.8 g/dL — ABNORMAL LOW (ref 12.0–15.0)

## 2021-02-17 LAB — VITAMIN B12: Vitamin B-12: 546 pg/mL (ref 180–914)

## 2021-02-17 LAB — FERRITIN: Ferritin: 727 ng/mL — ABNORMAL HIGH (ref 11–307)

## 2021-02-17 LAB — PREPARE RBC (CROSSMATCH)

## 2021-02-17 LAB — FOLATE: Folate: 29.6 ng/mL (ref >5.9)

## 2021-02-17 MED ORDER — HEPARIN SOD (PORK) LOCK FLUSH 100 UNIT/ML IV SOLN
500.0000 [IU] | Freq: Once | INTRAVENOUS | Status: AC
  Administered 2021-02-17: 500 [IU]
  Filled 2021-02-17: qty 5

## 2021-02-17 MED ORDER — SODIUM CHLORIDE 0.9 % IV SOLN
10.0000 mL/h | Freq: Once | INTRAVENOUS | Status: AC
  Administered 2021-02-17: 10 mL/h via INTRAVENOUS

## 2021-02-17 MED ORDER — ACETAMINOPHEN 325 MG PO TABS
650.0000 mg | ORAL_TABLET | Freq: Once | ORAL | Status: AC
  Administered 2021-02-17: 650 mg via ORAL
  Filled 2021-02-17: qty 2

## 2021-02-17 MED ORDER — SODIUM CHLORIDE 0.9 % IV SOLN: INTRAVENOUS | Status: DC

## 2021-02-17 MED ORDER — ONDANSETRON HCL 4 MG/2ML IJ SOLN
INTRAMUSCULAR | Status: AC
  Filled 2021-02-17: qty 4

## 2021-02-17 MED ORDER — ONDANSETRON HCL 4 MG/2ML IJ SOLN
8.0000 mg | Freq: Once | INTRAMUSCULAR | Status: AC
  Administered 2021-02-17: 8 mg via INTRAVENOUS

## 2021-02-17 MED ORDER — DEXAMETHASONE SODIUM PHOSPHATE 10 MG/ML IJ SOLN
10.0000 mg | Freq: Once | INTRAMUSCULAR | Status: DC
  Filled 2021-02-17: qty 1

## 2021-02-17 MED ORDER — SODIUM CHLORIDE 0.9 % IV SOLN
10.0000 mg | Freq: Once | INTRAVENOUS | Status: AC
  Administered 2021-02-17: 10 mg via INTRAVENOUS
  Filled 2021-02-17: qty 10

## 2021-02-17 NOTE — Telephone Encounter (Signed)
Re: Dizziness/Weakness  Mrs. Zunker is a 50 year old female with past medical history significant for migraines, GERD, osteoarthritis, morbid obesity who is currently being treated for carcinomatosis under the care of Dr. Rogue Bussing.  Has required several paracentesis in the past.  She has had several lines of treatment currently on Avastin/Lynparza.  Tumor markers appear to be improving.  She was last seen in clinic on 02/05/2021 prior to her next cycle of chemotherapy.  Lab work appeared stable except for anemia.  Hemoglobin 7.5.  She was instructed to return to clinic weekly for lab work.  Unfortunately, due to lack of transport she was unable to make these appointments.  Today, she calls clinic complaining of significant dizziness, headache, dry mouth and shortness of breath.  She feels like she might "pass out" when she stands or walks any significant distance.  She has been trying to stay hydrated but her appetite is poor.  She is unable to come to [place] today due to transportation issues but lives close to [facility].  Spoke to Anastasio Champion, RN over at [facility] who is agreeable to check labs and give IV fluids.  Orders placed for CBC, CMP.   She will get 1 L of normal saline, 10 mg dexamethasone and 8 mg Zofran while at [facility].  Will review labs once they return.  Patient is aware that if she is unstable, she will need to go directly to the emergency room at [facility].  Faythe Casa, NP 02/17/2021 10:01 AM

## 2021-02-17 NOTE — Discharge Instructions (Signed)
Your hemoglobin increased to 7.8 after 2 units red blood cells. Please follow up with your PCP regarding your ED visit today.   Return to the ED for any signs of anemia including dizziness, lightheadedness, vision changes, chest pain, shortness of breath, passing out, or any other new/concerning symptoms

## 2021-02-17 NOTE — ED Notes (Signed)
Labs drawn from port.

## 2021-02-17 NOTE — Telephone Encounter (Signed)
Re: Labs  Arranged for patient to have lab work completed over at [facility].  Labs show a hemoglobin of 5.9 (7.5).  She was instructed to stop [place] Unfortunately, they are unable to accommodate a blood transfusion at the cancer center. She is taken directly to the emergency room for 2 units packed red blood cells.  Patient is in agreement.  She likely will be able to go home after blood transfusion.  Keep all scheduled appointments.   Faythe Casa, NP 02/17/2021 1:01 PM

## 2021-02-17 NOTE — Progress Notes (Signed)
Pt presents today after speaking with staff at [facility].  She reports to them that she is short of breath, dizzy, having constant HA, and also c/o palpitations onset approx 4-5 days ago.  Per Lorretta Harp, NP, pt is treated in their clinic with Avastin q 3 weeks and takes [place] daily.  Pt was instructed to hold the Lynparza until further notice per NP (last dose 02/16/21). Our clinic was asked to accommodate this pt for lab check, anti-nausea meds, and  IVF.  Pt also reports to me that she had a fall on Saturday, 02/13/2021.  States, "I just stood up too fast I think and got dizzy and fell".  Denies LOC or head injury.  Critical hgb report of 5.9 rec'd at 1251 from B. Matthews in lab. Lorretta Harp, NP notified.  As we are unable to accommodate pt today in clinic for multiple units of PRBC, I was advised by NP to have pt report to the ED for further evaluation and management of severe, symptomatic anemia.  Report called to Legrand Como, ED Charge RN.  Pt escorted to ED via wheelchair where care assumed by ED staff.

## 2021-02-17 NOTE — ED Triage Notes (Addendum)
Pt brought down from [facility] for abnormal Hgb. Pt has been having weakness, headache for couple weeks. Hgb noted to be 5.9 today. Per oncology noted, unable to accommodate pt for 2 units of PRBC today in [place] so sent to ED for transfusion.

## 2021-02-17 NOTE — ED Provider Notes (Signed)
Care assumed from Brianna Robinson, PA-C, at shift change, please see their notes for full documentation of patient's complaint/HPI. Briefly, pt here with abnormal lab with hgb 5.9 today. Awaiting 2 units PRBCs and repeat H&H. Plan is to dispo home if H&H has improved to baseline around 7.   Physical Exam  BP 107/65   Pulse 67   Temp 97.9 F (36.6 C) (Oral)   Resp 17   Ht 5\' 3"  (1.6 m)   Wt 117.9 kg   LMP 06/19/2017   SpO2 98%   BMI 46.06 kg/m   Physical Exam Vitals and nursing note reviewed.  Constitutional:      Appearance: She is not ill-appearing.  HENT:     Head: Normocephalic and atraumatic.  Eyes:     Conjunctiva/sclera: Conjunctivae normal.  Cardiovascular:     Rate and Rhythm: Normal rate and regular rhythm.  Pulmonary:     Effort: Pulmonary effort is normal.     Breath sounds: Normal breath sounds.  Skin:    General: Skin is warm and dry.     Coloration: Skin is not jaundiced.  Neurological:     Mental Status: She is alert.     ED Course/Procedures     Procedures  Results for orders placed or performed during the hospital encounter of 02/17/21  Vitamin B12  Result Value Ref Range   Vitamin B-12 546 180 - 914 pg/mL  Folate  Result Value Ref Range   Folate 29.6 >5.9 ng/mL  Iron and TIBC  Result Value Ref Range   Iron 273 (H) 28 - 170 ug/dL   TIBC 323 250 - 450 ug/dL   Saturation Ratios 84 (H) 10.4 - 31.8 %   UIBC 50 ug/dL  Ferritin  Result Value Ref Range   Ferritin 727 (H) 11 - 307 ng/mL  Reticulocytes  Result Value Ref Range   Retic Ct Pct 0.7 0.4 - 3.1 %   RBC. 1.69 (L) 3.87 - 5.11 MIL/uL   Retic Count, Absolute 12.5 (L) 19.0 - 186.0 K/uL   Immature Retic Fract 13.9 2.3 - 15.9 %  Hemoglobin and hematocrit, blood  Result Value Ref Range   Hemoglobin 7.8 (L) 12.0 - 15.0 g/dL   HCT 23.9 (L) 36.0 - 46.0 %  Type and screen [facility]  Result Value Ref Range   ABO/RH(D) B POS    Antibody Screen NEG    Sample Expiration  02/20/2021,2359    Unit Number [id number]    Blood Component Type RED CELLS,LR    Unit division 00    Status of Unit ISSUED    Transfusion Status OK TO TRANSFUSE    Crossmatch Result      Compatible Performed at [facility], [address] [place], [place] [address]    Unit Number [id number]    Blood Component Type RED CELLS,LR    Unit division 00    Status of Unit ISSUED    Transfusion Status OK TO TRANSFUSE    Crossmatch Result Compatible   Prepare RBC (crossmatch)  Result Value Ref Range   Order Confirmation      ORDER PROCESSED BY BLOOD BANK Performed at [facility], [address] [place], [place] [address]   BPAM [facility]  Result Value Ref Range   ISSUE DATE / TIME [id number]    Blood Product Unit Number [id number]    PRODUCT CODE [id number]    Unit Type and Rh 5100    Blood Product Expiration Date [id number]  ISSUE DATE / TIME [id number]    Blood Product Unit Number [id number]    PRODUCT CODE [id number]    Unit Type and Rh 5100    Blood Product Expiration Date [id number]     MDM  Pt received 2 U PRBCS. Repeat H&H with hgb 7.8 which is near pt's baseline. Will discharge home at this time with PCP follow up.   Hemoglobin  Date Value Ref Range Status  02/17/2021 7.8 (L) 12.0 - 15.0 g/dL Final    Comment:    POST TRANSFUSION SPECIMEN  02/17/2021 5.9 (LL) 12.0 - 15.0 g/dL Final    Comment:    This critical result has verified and been called to [facility] by Lorette Ang on 02 23 2022 at 1252, and has been read back.   02/05/2021 7.5 (L) 12.0 - 15.0 g/dL Final  01/07/2021 10.0 (L) 12.0 - 15.0 g/dL Final          Eustaquio Maize, PA-C 02/17/21 2305    Horton, Alvin Critchley, DO 02/18/21 1543

## 2021-02-17 NOTE — ED Provider Notes (Signed)
[facility] EMERGENCY DEPARTMENT Provider Note   CSN: [id number] Arrival date & time: 02/17/21  1319     History Chief Complaint  Patient presents with  . Abnormal Lab    [name] is a 50 y.o. female past medical history of metastatic ovarian cancer, hypertension, presenting to the emergency department with from cancer center for anemia.  Patient states over the last couple of weeks she has been feeling progressively worse with severe fatigue, dyspnea on exertion, near syncope with standing, and headaches.  States she has been sleeping in excess as well.  Denies melena or hematochezia.  She is currently taking Avastin and Lynparza for cancer treatment.  With her low hemoglobin today, she reports they now have discontinued her Lonie Peak for the time being.  Hemoglobin was reportedly 7.5 about 2 weeks ago, however patient was unable to make it back to the cancer center for weekly monitoring of her hemoglobin and presented today for symptomatic anemia.  States she has never required a blood transfusion in the past but has been anemic.  Not on anticoagulation.  The history is provided by the patient and medical records.       Past Medical History:  Diagnosis Date  . 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   Pathogenic BRCA1 mutation at [place]  . Cancer of bronchus of right upper lobe ([place]) 12/11/2019  . Clotting disorder (HCC)    Right arm blood clot when she started Chemo.  . Depression   . Drug-induced androgenic alopecia   . Dysrhythmia   . Family history of breast cancer   . GERD (gastroesophageal reflux disease)   . Hypertension   . Menorrhagia   . Migraines   . Osteoarthritis    back  . Ovarian cancer ([place]) 12/10/2019  . Personal history of chemotherapy    ovarian cancer  . Plantar fasciitis     Patient Active Problem List   Diagnosis Date Noted  . 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 Arm vein blood clot, right 03/11/2020  . Malignant ascites 12/22/2019  . Acute  dyspnea 12/22/2019  . Pleural effusion on right 12/22/2019  . Tachycardia 12/22/2019  . Primary peritoneal carcinomatosis ([place]) 12/16/2019  . Goals of care, counseling/discussion 12/16/2019  . 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 Erythrocytosis 11/12/2019  . Morbid obesity with BMI of 40.0-44.9, adult ([place]) 07/07/2018  . 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 Fever blister 05/17/2018  . Family history of breast cancer 05/08/2018  . Family history of colon cancer in mother 05/08/2018  . Migraine with aura and without status migrainosus 04/18/2018  . Dysmenorrhea 06/21/2017  . Menorrhagia 06/21/2017  . Mild recurrent major depression ([place]) 01/20/2016  . Esophageal reflux 01/20/2016  . Osteoarthritis, multiple sites 01/20/2016  . Frequent headaches 01/20/2016    Past Surgical History:  Procedure Laterality Date  . ABDOMINAL HYSTERECTOMY  03/2020  . APPENDECTOMY     LSC but "ruptured when they did the surgery"  . BREAST BIOPSY Left 01/04/2021   MRI BX  . CESAREAN SECTION    . CYSTOSCOPY N/A 04/08/2020   Procedure: CYSTOSCOPY;  [name], MD;  [name];  Service: Gynecology;  [name];  . IR THORACENTESIS ASP PLEURAL SPACE W/IMG GUIDE  12/06/2019  . IUD REMOVAL N/A 04/08/2020   Procedure: INTRAUTERINE DEVICE (IUD) REMOVAL;  [name], MD;  [name];  Service: Gynecology;  [name];  . PARACENTESIS     x6  . PORTA CATH INSERTION N/A 04/23/2020  Procedure: PORTA CATH INSERTION;  [name], MD;  Location: [facility];  Service: Cardiovascular;  [name];  . TUBAL LIGATION     at time of CSxn  . WRIST SURGERY Left 11/21/2016   plates and screws inserted     OB History    Gravida  5   Para  4   Term  4   Preterm      AB  1   Living  4     SAB      IAB  1   Ectopic      Multiple      Live Births  4           Family History  Adopted: Yes  Problem Relation  Age of Onset  . Lung cancer Father        deceased 23  . Breast cancer Mother 57       currently 89  . Colon cancer Mother   . ADD / ADHD Son   . ADD / ADHD Son   . Early death Maternal Aunt   . Breast cancer Maternal Aunt 34       deceased 49  . Breast cancer Maternal Grandmother   . Depression Daughter   . Depression Daughter   . Prostate cancer Paternal Uncle   . Stroke Paternal Uncle   . Leukemia Paternal Aunt   . Breast cancer Paternal Grandmother   . Cancer Maternal Uncle     Social History   Tobacco Use  . Smoking status: Current Every Day Smoker    Packs/day: 0.50    Years: 30.00    Pack years: 15.00    Types: Cigarettes  . Smokeless tobacco: Never Used  Vaping Use  . Vaping Use: Never used  Substance Use Topics  . Alcohol use: Not Currently    Alcohol/week: 0.0 standard drinks  . Drug use: No    Home Medications Prior to Admission medications   Medication Sig Start Date End Date Taking? Authorizing Provider  ALPRAZolam (XANAX) 0.25 MG tablet Take 1 tablet (0.25 mg total) by mouth 2 (two) times daily as needed for anxiety. Please do not drive while on the medication- as this can cause dizziness. 02/09/21   Borders, [name], NP  busPIRone (BUSPAR) 5 MG tablet Take 1-2 tablets (5-10 mg total) by mouth 3 (three) times daily as needed. 01/14/21   Steele Sizer, MD  cloNIDine (CATAPRES - DOSED IN MG/24 HR) 0.3 mg/24hr patch Place 1 patch (0.3 mg total) onto the skin once a week. 12/23/20   Verlon Au, NP  cloNIDine (CATAPRES) 0.1 MG tablet Take 1 tablet (0.1 mg total) by mouth at bedtime. 01/14/21   Steele Sizer, MD  DULoxetine (CYMBALTA) 60 MG capsule Take 2 capsules (120 mg total) by mouth at bedtime. 01/14/21   Steele Sizer, MD  lidocaine-prilocaine (EMLA) cream Apply 1 application topically as needed. 04/27/20   Cammie Sickle, MD  loratadine (CLARITIN) 10 MG tablet TAKE 1 TABLET(10 MG) BY MOUTH EVERY MORNING 12/22/20   Verlon Au, NP   Magnesium 400 MG CAPS  09/19/20   [provider]  metoprolol succinate (TOPROL-XL) 25 MG 24 hr tablet TAKE 1/2 TABLET(12.5 MG) BY MOUTH DAILY 11/23/20   Steele Sizer, MD  Multiple Vitamin (MULTIVITAMIN WITH MINERALS) TABS tablet Take 1 tablet by mouth daily.    [provider]  olaparib (LYNPARZA) 100 MG tablet Take 1 pill twice a day [along with  $'150mg'g$  pill- Total 250 mg twice a day]. Swallow whole. May take with food to decrease nausea and vomiting. DO NOT START UNTIL EVALUATED BY MD. 10/20/20   Cammie Sickle, MD  olaparib (LYNPARZA) 150 MG tablet Take 1 pill twice a day [along with 100 mg pill- Total 250 mg twice a day]. Swallow whole. May take with food to decrease nausea and vomiting. DO NOT START UNTIL EVALUATED BY MD. 10/20/20   Cammie Sickle, MD  omeprazole (PRILOSEC) 20 MG capsule TAKE 1 CAPSULE(20 MG) BY MOUTH DAILY 12/21/20   Ancil Boozer, Drue Stager, MD  ondansetron (ZOFRAN) 8 MG tablet One pill every 8 hours as needed for nausea/vomitting. 06/30/20   Cammie Sickle, MD  oxyCODONE (OXY IR/ROXICODONE) 5 MG immediate release tablet Take 1-2 tablets (5-10 mg total) by mouth every 8 (eight) hours as needed for severe pain. 02/09/21   Borders, [name], NP  polyethylene glycol powder (GLYCOLAX/MIRALAX) 17 GM/SCOOP powder as needed. 02/17/20   [provider]  pregabalin (LYRICA) 75 MG capsule Take 1 capsule (75 mg total) by mouth 3 (three) times daily. 01/14/21   Steele Sizer, MD  prochlorperazine (COMPAZINE) 10 MG tablet Take 1 tablet (10 mg total) by mouth every 6 (six) hours as needed for nausea or vomiting. 06/30/20   Cammie Sickle, MD  SUMAtriptan (IMITREX) 100 MG tablet Take 1 tablet (100 mg total) by mouth every 2 (two) hours as needed for migraine. May repeat in 2 hours if headache persists or recurs. 03/24/20   Steele Sizer, MD  traZODone (DESYREL) 50 MG tablet Take 0.5-1 tablets (25-50 mg total) by mouth at bedtime as needed for sleep.  05/26/20   Borders, [name], NP  valACYclovir (VALTREX) 1000 MG tablet Take 1 tablet (1,000 mg total) by mouth 2 (two) times daily as needed (outbreak). 12/16/19   Steele Sizer, MD  ranitidine (ZANTAC) 300 MG tablet Take 1 tablet (300 mg total) by mouth daily as needed for heartburn. 04/18/18 10/24/19  Steele Sizer, MD    Allergies    Patient has no known allergies.  Review of Systems   Review of Systems  Constitutional: Positive for fatigue.  Respiratory: Positive for shortness of breath.   Neurological: Positive for light-headedness and headaches.  All other systems reviewed and are negative.   Physical Exam Updated Vital Signs BP 128/67   Pulse 78   Temp 98.1 F (36.7 C) (Oral)   Resp 16   Ht $R'5\' 3"'gx$  (1.6 m)   Wt 117.9 kg   LMP 06/19/2017   SpO2 96%   BMI 46.06 kg/m   Physical Exam Vitals and nursing note reviewed.  Constitutional:      Appearance: She is well-developed and well-nourished.  HENT:     Head: Normocephalic and atraumatic.  Eyes:     Comments: Pale conjunctivae  Cardiovascular:     Rate and Rhythm: Normal rate and regular rhythm.     Pulses: Normal pulses.  Pulmonary:     Effort: Pulmonary effort is normal. No respiratory distress.     Breath sounds: Normal breath sounds.  Abdominal:     General: Bowel sounds are normal.     Palpations: Abdomen is soft.  Skin:    General: Skin is warm.     Coloration: Skin is pale.  Neurological:     Mental Status: She is alert and oriented to person, place, and time.  Psychiatric:        Mood and Affect: Mood and affect normal.  Behavior: Behavior normal.     ED Results / Procedures / Treatments   Labs (all labs ordered are listed, but only abnormal results are displayed) Labs Reviewed  IRON AND TIBC - Abnormal; Notable for the following components:      Result Value   Iron 273 (*)    Saturation Ratios 84 (*)    All other components within normal limits  FERRITIN - Abnormal; Notable for the  following components:   Ferritin 727 (*)    All other components within normal limits  RETICULOCYTES - Abnormal; Notable for the following components:   RBC. 1.69 (*)    Retic Count, Absolute 12.5 (*)    All other components within normal limits  VITAMIN B12  FOLATE  TYPE AND SCREEN  PREPARE RBC (CROSSMATCH)    EKG None  Radiology No results found.  Procedures .Critical Care Performed by: Robinson, [place] N, PA-C Authorized by: Robinson, [place] N, PA-C   Critical care provider statement:    Critical care time (minutes):  35   Critical care time was exclusive of:  Separately billable procedures and treating other patients and teaching time   Critical care was necessary to treat or prevent imminent or life-threatening deterioration of the following conditions: symptomatic anemia.   Critical care was time spent personally by me on the following activities:  Discussions with consultants, evaluation of patient's response to treatment, examination of patient, ordering and performing treatments and interventions, ordering and review of laboratory studies, ordering and review of radiographic studies, pulse oximetry, re-evaluation of patient's condition, obtaining history from patient or surrogate and review of old charts   I assumed direction of critical care for this patient from another provider in my specialty: no       Medications Ordered in ED Medications  0.9 %  sodium chloride infusion (10 mL/hr Intravenous New Bag/Given 02/17/21 1641)  acetaminophen (TYLENOL) tablet 650 mg (650 mg Oral Given 02/17/21 2033)    ED Course  I have reviewed the triage vital signs and the nursing notes.  Pertinent labs & imaging results that were available during my care of the patient were reviewed by me and considered in my medical decision making (see chart for details).    MDM Rules/Calculators/A&P                          Patient with metastatic ovarian cancer currently taking avastin and  lynparza, presenting from cancer center with symptomatic anemia with hemoglobin of 5.9 and hematocrit of 18.3.  Platelets are 115.  Vital signs are stable on arrival.  She is pale in appearance.  Endorses significant symptoms of anemia.  Per NP documentation from cancer center today, she was sent to the ED for transfusion of 2 units of PRBC and thought to be likely appropriate for discharge to home following transfusion.   Patient denies melena or hematochezia, hematuria, vaginal bleeding or other sources of a blood loss anemia.  Likely related to her medications, which the cancer center has just now discontinued her Lonie Peak as thought to be the culprit.  2 units of PRBC ordered for transfusion.  Anemia panel also sent.  Patient with some improvement after first unit of PRBC.  Headache treated with Tylenol.  Pending second unit transfusion and recheck H&H, care handed off at shift change to [place], [place].  Plan to recheck H&H.  Prior to today, hemoglobin was around 7.5.  Expect about 2 g improvement in hemoglobin.  Anticipate  discharge to home if improvement.  Close follow-up with oncology.   Final Clinical Impression(s) / ED Diagnoses Final diagnoses:  Symptomatic anemia    Rx / DC Orders ED Discharge Orders    None       Robinson, [place] N, PA-C 02/17/21 2040    Fredia Sorrow, MD 03/03/21 [id number]

## 2021-02-17 NOTE — Telephone Encounter (Signed)
Patient called reporting that she is not doing well. She is shortness of breath, has a headache, dry mouth, dizziness and feeling she is going to pass out when she stands as well as her heart starts racing, sleeping a lot, feels cold, but has not checked to see is she is running a fever. She states that she does not have transportation to get to [place], but is only 10 minutes from [facility]. I had advised patient that she needs to be evaluated and that she should go to ER, but she really does not want to go to ER and sit for hours. She requested that we perhaps get her in with oncology at [facility]. I spoke with Lorretta Harp, NP who then spoke with patient and she will attempt to get patient in for labs and IV fluids at [facility] and call patient back.

## 2021-02-18 ENCOUNTER — Telehealth: Payer: Self-pay | Admitting: Oncology

## 2021-02-18 LAB — BPAM RBC
Blood Product Expiration Date: 202203212359
Blood Product Expiration Date: 202203232359
ISSUE DATE / TIME: 202202231640
ISSUE DATE / TIME: 202202231938
Unit Type and Rh: 5100
Unit Type and Rh: 5100

## 2021-02-18 LAB — TYPE AND SCREEN
ABO/RH(D): B POS
Antibody Screen: NEGATIVE
Unit division: 0
Unit division: 0

## 2021-02-18 NOTE — Telephone Encounter (Signed)
Re: Appt for tomorrow   [name] called clinic to see if she needed to come for labs tomorrow or if she could cancel her appt.   She was given 2 unit PRBC in [facility] yesterday afternoon for hemoglobin of 5.9 d/t lymparza. Dr. Rogue Bussing recommended she STOP lymparza.   She is feeling much better after receiving blood. She continues to have a headache but denies any dizziness or palpitations.   She is instructed to continue to HOLD lymparza until next appt which is scheduled for 02/26/21 with Dr. Rogue Bussing. We will recheck labs at that time. Patient in agreement.   Dr. Rogue Bussing updated.  Faythe Casa, NP 02/18/2021 12:55 PM

## 2021-02-19 ENCOUNTER — Inpatient Hospital Stay: Payer: Medicaid Other

## 2021-02-24 ENCOUNTER — Ambulatory Visit: Payer: Medicaid Other | Admitting: Family Medicine

## 2021-02-25 ENCOUNTER — Other Ambulatory Visit: Payer: Self-pay | Admitting: *Deleted

## 2021-02-25 DIAGNOSIS — C482 Malignant neoplasm of peritoneum, unspecified: Secondary | ICD-10-CM

## 2021-02-26 ENCOUNTER — Inpatient Hospital Stay: Payer: Medicaid Other | Attending: Internal Medicine

## 2021-02-26 ENCOUNTER — Inpatient Hospital Stay (HOSPITAL_BASED_OUTPATIENT_CLINIC_OR_DEPARTMENT_OTHER): Payer: Medicaid Other | Admitting: Internal Medicine

## 2021-02-26 ENCOUNTER — Other Ambulatory Visit: Payer: Self-pay

## 2021-02-26 ENCOUNTER — Other Ambulatory Visit: Payer: Self-pay | Admitting: Family Medicine

## 2021-02-26 ENCOUNTER — Inpatient Hospital Stay: Payer: Medicaid Other

## 2021-02-26 DIAGNOSIS — C482 Malignant neoplasm of peritoneum, unspecified: Secondary | ICD-10-CM | POA: Insufficient documentation

## 2021-02-26 DIAGNOSIS — B001 Herpesviral vesicular dermatitis: Secondary | ICD-10-CM

## 2021-02-26 DIAGNOSIS — D649 Anemia, unspecified: Secondary | ICD-10-CM | POA: Diagnosis not present

## 2021-02-26 DIAGNOSIS — K439 Ventral hernia without obstruction or gangrene: Secondary | ICD-10-CM | POA: Insufficient documentation

## 2021-02-26 DIAGNOSIS — F17210 Nicotine dependence, cigarettes, uncomplicated: Secondary | ICD-10-CM | POA: Diagnosis not present

## 2021-02-26 LAB — COMPREHENSIVE METABOLIC PANEL
ALT: 21 U/L (ref 0–44)
AST: 28 U/L (ref 15–41)
Albumin: 3.8 g/dL (ref 3.5–5.0)
Alkaline Phosphatase: 51 U/L (ref 38–126)
Anion gap: 12 (ref 5–15)
BUN: 10 mg/dL (ref 6–20)
CO2: 22 mmol/L (ref 22–32)
Calcium: 8.9 mg/dL (ref 8.9–10.3)
Chloride: 102 mmol/L (ref 98–111)
Creatinine, Ser: 0.67 mg/dL (ref 0.44–1.00)
GFR, Estimated: >60 mL/min (ref >60)
Glucose, Bld: 128 mg/dL — ABNORMAL HIGH (ref 70–99)
Potassium: 3.9 mmol/L (ref 3.5–5.1)
Sodium: 136 mmol/L (ref 135–145)
Total Bilirubin: 0.7 mg/dL (ref 0.3–1.2)
Total Protein: 7.1 g/dL (ref 6.5–8.1)

## 2021-02-26 LAB — CBC WITH DIFFERENTIAL/PLATELET
Abs Immature Granulocytes: 0.01 10*3/uL (ref 0.00–0.07)
Basophils Absolute: 0 10*3/uL (ref 0.0–0.1)
Basophils Relative: 1 %
Eosinophils Absolute: 0.1 10*3/uL (ref 0.0–0.5)
Eosinophils Relative: 2 %
HCT: 29.1 % — ABNORMAL LOW (ref 36.0–46.0)
Hemoglobin: 8.9 g/dL — ABNORMAL LOW (ref 12.0–15.0)
Immature Granulocytes: 0 %
Lymphocytes Relative: 37 %
Lymphs Abs: 1.4 10*3/uL (ref 0.7–4.0)
MCH: 30 pg (ref 26.0–34.0)
MCHC: 30.6 g/dL (ref 30.0–36.0)
MCV: 98 fL (ref 80.0–100.0)
Monocytes Absolute: 0.4 10*3/uL (ref 0.1–1.0)
Monocytes Relative: 11 %
Neutro Abs: 1.8 10*3/uL (ref 1.7–7.7)
Neutrophils Relative %: 49 %
Platelets: 125 10*3/uL — ABNORMAL LOW (ref 150–400)
RBC: 2.97 MIL/uL — ABNORMAL LOW (ref 3.87–5.11)
RDW: 24.2 % — ABNORMAL HIGH (ref 11.5–15.5)
WBC: 3.7 10*3/uL — ABNORMAL LOW (ref 4.0–10.5)
nRBC: 0 % (ref 0.0–0.2)

## 2021-02-26 LAB — URINALYSIS, COMPLETE (UACMP) WITH MICROSCOPIC
Bilirubin Urine: NEGATIVE
Glucose, UA: NEGATIVE mg/dL
Hgb urine dipstick: NEGATIVE
Ketones, ur: NEGATIVE mg/dL
Nitrite: NEGATIVE
Protein, ur: NEGATIVE mg/dL
Specific Gravity, Urine: 1.014 (ref 1.005–1.030)
pH: 5 (ref 5.0–8.0)

## 2021-02-26 LAB — SAMPLE TO BLOOD BANK

## 2021-02-26 LAB — URINALYSIS, MICROSCOPIC (REFLEX)

## 2021-02-26 MED ORDER — HEPARIN SOD (PORK) LOCK FLUSH 100 UNIT/ML IV SOLN
500.0000 [IU] | Freq: Once | INTRAVENOUS | Status: AC
  Administered 2021-02-26: 500 [IU]
  Filled 2021-02-26: qty 5
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
## 2021-02-26 NOTE — Assessment & Plan Note (Signed)
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 STABLE tumor markers- improving.   # last Bev last 2/11- [on Lymparza 250 mg BID-poor tolerance-see below]. Labs today reviewed;  HOLD bev for hernia surgery- see below.    # Severe recurrent anemia-on lyparza 250 mg BID [Hb 5.8 on 2/23- needing 2 units PRBC.;  Continue to hold Lynparza.  We will plan to start after surgery.  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   #Abdominal swelling-ventral hernia-symptomatic-s/p evaluation with Dr.Byrnett; currently on 7-8 cig/day; HOLD avastin/lynparza; appt on 3/18.  Discussed with Dr. Clent Jacks role for second look at the time of hernia surgery.   #IV access/Mediport-question malfunction.  But repeat improved.  # DISPOSITION:  # de-accessed; HOLD avastin today #Follow-up in 1st week of April -MD labs-CBC CMP; ca-125; no chemo- Dr.B

## 2021-03-03 ENCOUNTER — Inpatient Hospital Stay: Payer: Medicaid Other

## 2021-03-08 ENCOUNTER — Ambulatory Visit: Payer: Medicaid Other | Admitting: Family Medicine

## 2021-03-16 ENCOUNTER — Other Ambulatory Visit: Payer: Self-pay | Admitting: Hospice and Palliative Medicine

## 2021-03-16 ENCOUNTER — Other Ambulatory Visit: Payer: Self-pay | Admitting: *Deleted

## 2021-03-16 MED ORDER — OXYCODONE HCL 5 MG PO TABS: 5.0000 mg | ORAL_TABLET | Freq: Three times a day (TID) | ORAL | 0 refills | Status: DC | PRN

## 2021-03-16 MED ORDER — LORAZEPAM 0.5 MG PO TABS: 0.5000 mg | ORAL_TABLET | Freq: Three times a day (TID) | ORAL | 0 refills | Status: DC | PRN

## 2021-03-16 NOTE — Telephone Encounter (Signed)
Josh - pls advise

## 2021-03-16 NOTE — Telephone Encounter (Signed)
I called and left patient a message. I would recommend rotating from alprazolam to lorazepam.

## 2021-03-16 NOTE — Telephone Encounter (Signed)
Patient requesting that dose or strength of Alprazolam be increased as she finds that she is having to take more frequently lately

## 2021-03-16 NOTE — Progress Notes (Signed)
l spoke with patient by phone.  She has been alprazolam chronically, which she takes twice daily but no longer finds it is effective at controlling her anxiety.  She also continues to take Cymbalta and BuSpar.  Will rotate patient from alprazolam to lorazepam.

## 2021-03-17 ENCOUNTER — Telehealth: Payer: Self-pay | Admitting: *Deleted

## 2021-03-17 NOTE — Telephone Encounter (Signed)
Approved Haynes Hoehn Key: Al Decant - PA Case ID: [id number]

## 2021-03-17 NOTE — Telephone Encounter (Signed)
Patient called stating that pharmacy said that her pain medications need prior authorization before they can dispense

## 2021-03-18 ENCOUNTER — Encounter: Payer: Self-pay | Admitting: *Deleted

## 2021-03-24 ENCOUNTER — Inpatient Hospital Stay: Payer: Medicaid Other

## 2021-03-25 ENCOUNTER — Other Ambulatory Visit (HOSPITAL_COMMUNITY): Payer: Self-pay

## 2021-03-30 ENCOUNTER — Inpatient Hospital Stay: Payer: Medicaid Other

## 2021-03-30 ENCOUNTER — Inpatient Hospital Stay: Payer: Medicaid Other | Admitting: Internal Medicine

## 2021-04-07 ENCOUNTER — Inpatient Hospital Stay (HOSPITAL_BASED_OUTPATIENT_CLINIC_OR_DEPARTMENT_OTHER): Payer: Medicaid Other | Admitting: Obstetrics and Gynecology

## 2021-04-07 ENCOUNTER — Inpatient Hospital Stay: Payer: Medicaid Other | Admitting: Internal Medicine

## 2021-04-07 ENCOUNTER — Inpatient Hospital Stay: Payer: Medicaid Other

## 2021-04-07 DIAGNOSIS — C482 Malignant neoplasm of peritoneum, unspecified: Secondary | ICD-10-CM

## 2021-04-07 DIAGNOSIS — K432 Incisional hernia without obstruction or gangrene: Secondary | ICD-10-CM

## 2021-04-07 NOTE — Progress Notes (Signed)
Ms. Scronce did not show for her visit. We will try to reschedule. We will also determine her surgical date. I would like to be present to biopsy the abnormal lesions noted on CT scan. Beckey Rutter, NP will reach out to Dr. Bary Castilla and to the patient.  Sidnie Swalley Gaetana Michaelis, MD

## 2021-04-08 DIAGNOSIS — K439 Ventral hernia without obstruction or gangrene: Secondary | ICD-10-CM | POA: Diagnosis not present

## 2021-04-10 ENCOUNTER — Other Ambulatory Visit: Payer: Self-pay | Admitting: General Surgery

## 2021-04-10 NOTE — Progress Notes (Signed)
Subjective:     Patient ID: [name] is a 50 y.o. female.  HPI  The following portions of the patient's history were reviewed and updated as appropriate.  This an established patient is here today for: office visit. The patient is here today for re-evaluation of an umbilical hernia and to discuss surgery.   The patient has experienced an improvement in bowel function with the initiation of MiraLAX daily.       Chief Complaint  Patient presents with  . Follow-up    Umbilical hernia     BP 373/42   Pulse 56   Temp 36.6 C (97.9 F)   Ht 160 cm ($Remov'5\' 3"'VEpaqC$ )   Wt (!) 120.7 kg (266 lb)   SpO2 99%   BMI 47.12 kg/m       Past Medical History:  Diagnosis Date  . 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 Cancer of bronchus of right upper lobe (CMS-HCC) 12/11/2019  . Clotting disorder (CMS-HCC)   . Depression   . Drug-induced androgenic alopecia   . Dysrhythmia   . GERD (gastroesophageal reflux disease)   . Hypertension   . Menorrhagia   . Migraines   . Osteoarthritis   . Ovarian cancer (CMS-HCC) 12/10/2019  . Personal history of chemotherapy   . Plantar fasciitis           Past Surgical History:  Procedure Laterality Date  . ABDOMINAL HYSTERECTOMY  03/2020  . APPENDECTOMY  2012?  Marland Kitchen BREAST EXCISIONAL BIOPSY Left 01/04/2021  . CESAREAN SECTION    . CYSTOSCOPY  04/08/2020  . IR thoracentesis ASP pleural space with IMG guide  12/06/2019  . IUD removal  04/08/2020  . LAPAROSCOPIC TUBAL LIGATION    . PARACENTESIS    . port a cath insertion  04/23/2020  . wrist surgery Left 11/21/2016              OB History    Gravida  5   Para  4   Term      Preterm      AB  1   Living        SAB  1   IAB      Ectopic      Molar      Multiple      Live Births          Obstetric Comments  Age at first period 33 Age of first pregnancy 54         Social History          Socioeconomic History  . Marital status:  Single  Tobacco Use  . Smoking status: Current Every Day Smoker    Packs/day: 0.50    Years: 30.00    Pack years: 15.00    Types: Cigarettes  . Smokeless tobacco: Never Used       No Known Allergies  Current Medications        Current Outpatient Medications  Medication Sig Dispense Refill  . biotin 1 mg Cap Take by mouth once daily    . busPIRone (BUSPAR) 5 MG tablet Take by mouth 2 (two) times daily    . CATAPRES-TTS-1 0.1 mg/24 hr patch once a week    . DULoxetine (CYMBALTA) 60 MG DR capsule Take by mouth 2 (two) times daily    . lidocaine-prilocaine (EMLA) cream as needed    . loratadine (CLARITIN) 10 mg tablet Take 10 mg by mouth every morning    . LORazepam (ATIVAN) 0.5 MG tablet  Take 0.5 mg by mouth every 8 (eight) hours as needed    . metoprolol succinate (TOPROL-XL) 25 MG XL tablet once daily    . multivit-min/iron/folic acid/K (ADULTS MULTIVITAMIN ORAL) Take by mouth once daily    . multivitamin with B complex-vitamin C (FARBEE WITH C) tablet Take 1 tablet by mouth once daily    . olaparib (LYNPARZA) 150 mg Tab tablet 2 (two) times daily    . omeprazole (PRILOSEC) 20 MG DR capsule once daily    . ondansetron (ZOFRAN) 8 MG tablet as needed    . oxyCODONE (ROXICODONE) 5 MG immediate release tablet Take by mouth as needed    . polyethylene glycol (MIRALAX) powder as needed    . pregabalin (LYRICA) 75 MG capsule Take by mouth 3 (three) times daily    . prochlorperazine (COMPAZINE) 10 MG tablet Take 10 mg by mouth every 6 (six) hours as needed    . SUMAtriptan (IMITREX) 100 MG tablet Take by mouth as directed    . traZODone (DESYREL) 50 MG tablet once daily    . valACYclovir (VALTREX) 1000 MG tablet Take by mouth as directed    . ALPRAZolam (XANAX) 0.25 MG tablet Take by mouth as needed (Patient not taking: Reported on 04/08/2021)    . LYNPARZA 100 mg Tab tablet 2 (two) times daily (Patient not taking: Reported on 04/08/2021)     . magnesium oxide,aspartate,citr (TRIPLE MAGNESIUM COMPLEX) 400 mg magnesium Cap once daily (Patient not taking: Reported on 04/08/2021)     No current facility-administered medications for this visit.           Family History  Adopted: Yes  Problem Relation Age of Onset  . Breast cancer Mother   . Colon cancer Mother   . Lung cancer Father   . Breast cancer Maternal Grandmother   . Breast cancer Paternal Grandmother   . ADD / ADHD Son   . ADD / ADHD Son   . Breast cancer Maternal Aunt   . Early death Maternal Aunt   . Depression Daughter   . Depression Daughter   . Stroke Paternal Uncle   . Prostate cancer Paternal Uncle   . Leukemia Paternal Aunt   . Cancer Maternal Uncle        Review of Systems  Constitutional: Negative for chills and fever.  Respiratory: Negative for cough.        Objective:   Physical Exam Exam conducted with a chaperone present.  Constitutional:      Appearance: Normal appearance.  Cardiovascular:     Rate and Rhythm: Normal rate and regular rhythm.     Pulses: Normal pulses.     Heart sounds: Normal heart sounds.  Pulmonary:     Effort: Pulmonary effort is normal.     Breath sounds: Normal breath sounds.  Abdominal:       Comments:     Comments: There is a 10 cm diameter soft tissue mass when the patient is examined in the standing position.  In the supine position the fascial defect palpates approximately 5 cm in diameter in the epigastric area above the level of the umbilicus.   Musculoskeletal:     Cervical back: Neck supple.  Skin:    General: Skin is warm and dry.  Neurological:     Mental Status: She is alert and oriented to person, place, and time.  Psychiatric:        Mood and Affect: Mood normal.  Behavior: Behavior normal.    Labs and Radiology:  February 26, 2021 laboratory:   Ref Range & Units 1 mo ago  WBC 4.0 - 10.5 K/uL 3.7Low   RBC 3.87 - 5.11 MIL/uL 2.97Low    Hemoglobin 12.0 - 15.0 g/dL 8.9Low   HCT 36.0 - 46.0 % 29.1Low   MCV 80.0 - 100.0 fL 98.0   MCH 26.0 - 34.0 pg 30.0   MCHC 30.0 - 36.0 g/dL 30.6   RDW 11.5 - 15.5 % 24.2High   Platelets 150 - 400 K/uL 125Low   nRBC 0.0 - 0.2 % 0.0   Neutrophils Relative % % 49   Neutro Abs 1.7 - 7.7 K/uL 1.8   Lymphocytes Relative % 37   Lymphs Abs 0.7 - 4.0 K/uL 1.4   Monocytes Relative % 11   Monocytes Absolute 0.1 - 1.0 K/uL 0.4   Eosinophils Relative % 2   Eosinophils Absolute 0.0 - 0.5 K/uL 0.1   Basophils Relative % 1   Basophils Absolute 0.0 - 0.1 K/uL 0.0   Immature Granulocytes % 0   Abs Immature Granulocytes     Sodium 135 - 145 mmol/L 136   Potassium 3.5 - 5.1 mmol/L 3.9   Chloride 98 - 111 mmol/L 102   CO2 22 - 32 mmol/L 22   Glucose, Bld 70 - 99 mg/dL 128High   Comment: Glucose reference range applies only to samples taken after fasting for at least 8 hours.  BUN 6 - 20 mg/dL 10   Creatinine, Ser 0.44 - 1.00 mg/dL 0.67   Calcium 8.9 - 10.3 mg/dL 8.9   Total Protein 6.5 - 8.1 g/dL 7.1   Albumin 3.5 - 5.0 g/dL 3.8   AST 15 - 41 U/L 28   ALT 0 - 44 U/L 21   Alkaline Phosphatase 38 - 126 U/L 51   Total Bilirubin 0.3 - 1.2 mg/dL 0.7   GFR, Estimated >60 mL/min >60       Assessment:     Ventral hernia area post robotic GYN oncology procedure.  Increased risk for surgery based on obesity, smoking history and recent cytotoxic therapy.     Plan:     Confirmation has been received from medical oncology that a repeat staging laparotomy is not required by GYN oncology.  Indications for prosthetic mesh repair was reviewed.  Role of the abdominal binder post procedure was emphasized.  Smoking cessation encouraged.  Patient to be scheduled for surgery at a convenient date.     Entered by Ledell Noss, CMA, acting as a scribe for Dr. Hervey Ard, MD.   The documentation recorded by the scribe accurately reflects the service I personally performed and  the decisions made by me.   Robert Bellow, MD FACS

## 2021-04-14 ENCOUNTER — Other Ambulatory Visit: Payer: Self-pay | Admitting: Family Medicine

## 2021-04-14 ENCOUNTER — Other Ambulatory Visit: Payer: Self-pay | Admitting: *Deleted

## 2021-04-14 DIAGNOSIS — F331 Major depressive disorder, recurrent, moderate: Secondary | ICD-10-CM

## 2021-04-14 DIAGNOSIS — M5136 Other intervertebral disc degeneration, lumbar region: Secondary | ICD-10-CM

## 2021-04-15 MED ORDER — OXYCODONE HCL 5 MG PO TABS: 5.0000 mg | ORAL_TABLET | Freq: Three times a day (TID) | ORAL | 0 refills | Status: DC | PRN

## 2021-04-19 ENCOUNTER — Other Ambulatory Visit
Admission: RE | Admit: 2021-04-19 | Discharge: 2021-04-19 | Disposition: A | Discharge status: Home / Self Care | Payer: Medicaid Other | Source: Ambulatory Visit | Attending: General Surgery | Admitting: General Surgery

## 2021-04-19 ENCOUNTER — Other Ambulatory Visit: Payer: Self-pay

## 2021-04-19 DIAGNOSIS — M170 Bilateral primary osteoarthritis of knee: Secondary | ICD-10-CM | POA: Diagnosis not present

## 2021-04-19 DIAGNOSIS — M25562 Pain in left knee: Secondary | ICD-10-CM | POA: Diagnosis not present

## 2021-04-19 DIAGNOSIS — C482 Malignant neoplasm of peritoneum, unspecified: Secondary | ICD-10-CM

## 2021-04-19 DIAGNOSIS — Z01818 Encounter for other preprocedural examination: Secondary | ICD-10-CM | POA: Diagnosis not present

## 2021-04-19 DIAGNOSIS — M25561 Pain in right knee: Secondary | ICD-10-CM | POA: Diagnosis not present

## 2021-04-19 NOTE — Patient Instructions (Addendum)
Your procedure is scheduled on: 04/26/21 - Monday Report to the Registration Desk on the 1st floor of the [facility]. To find out your arrival time, please call [phone] between 1PM - 3PM on: 04/23/21 - Friday  REMEMBER: Instructions that are not followed completely may result in serious medical risk, up to and including death; or upon the discretion of your surgeon and anesthesiologist your surgery may need to be rescheduled.  Do not eat food after midnight the night before surgery.  No gum chewing, lozengers or hard candies.  You may however, drink CLEAR liquids up to 2 hours before you are scheduled to arrive for your surgery. Do not drink anything within 2 hours of your scheduled arrival time.  Clear liquids include: - water  - apple juice without pulp - gatorade (not RED, PURPLE, OR BLUE) - black coffee or tea (Do NOT add milk or creamers to the coffee or tea) Do NOT drink anything that is not on this list.  Type 1 and Type 2 diabetics should only drink water.  TAKE THESE MEDICATIONS THE MORNING OF SURGERY WITH A SIP OF WATER: - loratadine (CLARITIN) 10 MG tablet - metoprolol succinate (TOPROL-XL) 25 MG 24 hr tablet - omeprazole (PRILOSEC) 20 MG capsule, (take one the night before and one on the morning of surgery - helps to prevent nausea after surgery.) - pregabalin (LYRICA) 75 MG capsule  One week prior to surgery: Stop Anti-inflammatories (NSAIDS) such as Advil, Aleve, Ibuprofen, Motrin, Naproxen, Naprosyn and Aspirin based products such as Excedrin, Goodys Powder, BC Powder.  Stop ANY OVER THE COUNTER supplements until after surgery.  No Alcohol for 24 hours before or after surgery.  No Smoking including e-cigarettes for 24 hours prior to surgery.  No chewable tobacco products for at least 6 hours prior to surgery.  No nicotine patches on the day of surgery.  Do not use any "recreational" drugs for at least a week prior to your surgery.  Please be advised that  the combination of cocaine and anesthesia may have negative outcomes, up to and including death. If you test positive for cocaine, your surgery will be cancelled.  On the morning of surgery brush your teeth with toothpaste and water, you may rinse your mouth with mouthwash if you wish. Do not swallow any toothpaste or mouthwash.  Do not wear jewelry, make-up, hairpins, clips or nail polish.  Do not wear lotions, powders, or perfumes.   Do not shave body from the neck down 48 hours prior to surgery just in case you cut yourself which could leave a site for infection.  Also, freshly shaved skin may become irritated if using the CHG soap.  Contact lenses, hearing aids and dentures may not be worn into surgery.  Do not bring valuables to the hospital. [facility] is not responsible for any missing/lost belongings or valuables.   Use CHG Soap or wipes as directed on instruction sheet.  Notify your doctor if there is any change in your medical condition (cold, fever, infection).  Wear comfortable clothing (specific to your surgery type) to the hospital.  Plan for stool softeners for home use; pain medications have a tendency to cause constipation. You can also help prevent constipation by eating foods high in fiber such as fruits and vegetables and drinking plenty of fluids as your diet allows.  After surgery, you can help prevent lung complications by doing breathing exercises.  Take deep breaths and cough every 1-2 hours. Your doctor may order a  device called an Incentive Spirometer to help you take deep breaths. When coughing or sneezing, hold a pillow firmly against your incision with both hands. This is called "splinting." Doing this helps protect your incision. It also decreases belly discomfort.  If you are being admitted to the hospital overnight, leave your suitcase in the car. After surgery it may be brought to your room.  If you are being discharged the day of surgery, you will  not be allowed to drive home. You will need a responsible adult (18 years or older) to drive you home and stay with you that night.   If you are taking public transportation, you will need to have a responsible adult (18 years or older) with you. Please confirm with your physician that it is acceptable to use public transportation.   Please call the [facility] at [phone] if you have any questions about these instructions.  Surgery Visitation Policy:  Patients undergoing a surgery or procedure may have one family member or support person with them as long as that person is not COVID-19 positive or experiencing its symptoms.  That person may remain in the waiting area during the procedure.  Inpatient Visitation:    Visiting hours are 7 a.m. to 8 p.m. Inpatients will be allowed two visitors daily. The visitors may change each day during the patient's stay. No visitors under the age of 31. Any visitor under the age of 22 must be accompanied by an adult. The visitor must pass COVID-19 screenings, use hand sanitizer when entering and exiting the patient's room and wear a mask at all times, including in the patient's room. Patients must also wear a mask when staff or their visitor are in the room. Masking is required regardless of vaccination status.

## 2021-04-20 ENCOUNTER — Other Ambulatory Visit: Payer: Self-pay | Admitting: Urgent Care

## 2021-04-21 ENCOUNTER — Inpatient Hospital Stay: Payer: Medicaid Other | Attending: Internal Medicine

## 2021-04-21 ENCOUNTER — Inpatient Hospital Stay (HOSPITAL_BASED_OUTPATIENT_CLINIC_OR_DEPARTMENT_OTHER): Payer: Medicaid Other | Admitting: Internal Medicine

## 2021-04-21 ENCOUNTER — Inpatient Hospital Stay (HOSPITAL_BASED_OUTPATIENT_CLINIC_OR_DEPARTMENT_OTHER): Payer: Medicaid Other | Admitting: Obstetrics and Gynecology

## 2021-04-21 VITALS — BP 150/84 | HR 60 | Temp 97.8°F | Resp 20 | Wt 263.4 lb

## 2021-04-21 DIAGNOSIS — F330 Major depressive disorder, recurrent, mild: Secondary | ICD-10-CM | POA: Diagnosis not present

## 2021-04-21 DIAGNOSIS — Z90710 Acquired absence of both cervix and uterus: Secondary | ICD-10-CM | POA: Insufficient documentation

## 2021-04-21 DIAGNOSIS — Z5111 Encounter for antineoplastic chemotherapy: Secondary | ICD-10-CM | POA: Diagnosis not present

## 2021-04-21 DIAGNOSIS — C482 Malignant neoplasm of peritoneum, unspecified: Secondary | ICD-10-CM | POA: Diagnosis not present

## 2021-04-21 DIAGNOSIS — M1990 Unspecified osteoarthritis, unspecified site: Secondary | ICD-10-CM | POA: Diagnosis not present

## 2021-04-21 DIAGNOSIS — Z148 Genetic carrier of other disease: Secondary | ICD-10-CM | POA: Insufficient documentation

## 2021-04-21 DIAGNOSIS — Z90722 Acquired absence of ovaries, bilateral: Secondary | ICD-10-CM | POA: Insufficient documentation

## 2021-04-21 DIAGNOSIS — Z79899 Other long term (current) drug therapy: Secondary | ICD-10-CM | POA: Diagnosis not present

## 2021-04-21 DIAGNOSIS — Z6841 Body Mass Index (BMI) 40.0 and over, adult: Secondary | ICD-10-CM | POA: Diagnosis not present

## 2021-04-21 DIAGNOSIS — Z86718 Personal history of other venous thrombosis and embolism: Secondary | ICD-10-CM | POA: Insufficient documentation

## 2021-04-21 DIAGNOSIS — F17210 Nicotine dependence, cigarettes, uncomplicated: Secondary | ICD-10-CM | POA: Diagnosis not present

## 2021-04-21 DIAGNOSIS — N951 Menopausal and female climacteric states: Secondary | ICD-10-CM | POA: Insufficient documentation

## 2021-04-21 DIAGNOSIS — J910 Malignant pleural effusion: Secondary | ICD-10-CM | POA: Insufficient documentation

## 2021-04-21 DIAGNOSIS — K219 Gastro-esophageal reflux disease without esophagitis: Secondary | ICD-10-CM | POA: Insufficient documentation

## 2021-04-21 DIAGNOSIS — Z1501 Genetic susceptibility to malignant neoplasm of breast: Secondary | ICD-10-CM | POA: Insufficient documentation

## 2021-04-21 DIAGNOSIS — D696 Thrombocytopenia, unspecified: Secondary | ICD-10-CM | POA: Diagnosis not present

## 2021-04-21 LAB — URINALYSIS, COMPLETE (UACMP) WITH MICROSCOPIC
Bacteria, UA: NONE SEEN
Bilirubin Urine: NEGATIVE
Glucose, UA: NEGATIVE mg/dL
Hgb urine dipstick: NEGATIVE
Ketones, ur: 5 mg/dL — AB
Leukocytes,Ua: NEGATIVE
Nitrite: NEGATIVE
Protein, ur: 30 mg/dL — AB
Specific Gravity, Urine: 1.026 (ref 1.005–1.030)
pH: 5 (ref 5.0–8.0)

## 2021-04-21 LAB — CBC WITH DIFFERENTIAL/PLATELET
Abs Immature Granulocytes: 0.04 10*3/uL (ref 0.00–0.07)
Basophils Absolute: 0 10*3/uL (ref 0.0–0.1)
Basophils Relative: 1 %
Eosinophils Absolute: 0 10*3/uL (ref 0.0–0.5)
Eosinophils Relative: 0 %
HCT: 39.9 % (ref 36.0–46.0)
Hemoglobin: 12.7 g/dL (ref 12.0–15.0)
Immature Granulocytes: 1 %
Lymphocytes Relative: 23 %
Lymphs Abs: 1.6 10*3/uL (ref 0.7–4.0)
MCH: 32.2 pg (ref 26.0–34.0)
MCHC: 31.8 g/dL (ref 30.0–36.0)
MCV: 101.3 fL — ABNORMAL HIGH (ref 80.0–100.0)
Monocytes Absolute: 0.6 10*3/uL (ref 0.1–1.0)
Monocytes Relative: 8 %
Neutro Abs: 4.7 10*3/uL (ref 1.7–7.7)
Neutrophils Relative %: 67 %
Platelets: 163 10*3/uL (ref 150–400)
RBC: 3.94 MIL/uL (ref 3.87–5.11)
RDW: 17.8 % — ABNORMAL HIGH (ref 11.5–15.5)
WBC: 7 10*3/uL (ref 4.0–10.5)
nRBC: 0 % (ref 0.0–0.2)

## 2021-04-21 LAB — COMPREHENSIVE METABOLIC PANEL
ALT: 15 U/L (ref 0–44)
AST: 18 U/L (ref 15–41)
Albumin: 3.9 g/dL (ref 3.5–5.0)
Alkaline Phosphatase: 53 U/L (ref 38–126)
Anion gap: 9 (ref 5–15)
BUN: 13 mg/dL (ref 6–20)
CO2: 25 mmol/L (ref 22–32)
Calcium: 9.3 mg/dL (ref 8.9–10.3)
Chloride: 104 mmol/L (ref 98–111)
Creatinine, Ser: 0.75 mg/dL (ref 0.44–1.00)
GFR, Estimated: >60 mL/min (ref >60)
Glucose, Bld: 116 mg/dL — ABNORMAL HIGH (ref 70–99)
Potassium: 3.7 mmol/L (ref 3.5–5.1)
Sodium: 138 mmol/L (ref 135–145)
Total Bilirubin: 0.4 mg/dL (ref 0.3–1.2)
Total Protein: 7.2 g/dL (ref 6.5–8.1)

## 2021-04-21 LAB — SAMPLE TO BLOOD BANK

## 2021-04-21 MED ORDER — SODIUM CHLORIDE 0.9% FLUSH
10.0000 mL | INTRAVENOUS | Status: DC | PRN
  Administered 2021-04-21: 10 mL via INTRAVENOUS
  Filled 2021-04-21: qty 10

## 2021-04-21 MED ORDER — HEPARIN SOD (PORK) LOCK FLUSH 100 UNIT/ML IV SOLN
500.0000 [IU] | Freq: Once | INTRAVENOUS | Status: AC
  Administered 2021-04-21: 500 [IU] via INTRAVENOUS
  Filled 2021-04-21: qty 5
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
## 2021-04-21 NOTE — Assessment & Plan Note (Addendum)
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 STABLE tumor markers- improving.   #However current therapy on hold -Bev last 2/11 [upcoming surgery-see below];  Lymparza 250 mg BID-poor tolerance-see below].  # Severe recurrent anemia secondary to Lynparza 250 mg BID [Hb 5.8 on 2/23- needing 2 units PRBC;  Continue to hold Lonie Peak; will plan to start Lynparza after surgery-at lower dose.  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  Discussed with Dr. Fransisca Connors -no plan for second look at the time of hernia surgery.   #IV access/Mediport-stable/port flush  # DISPOSITION:  #Follow-up in 4 weeks-  -MD labs-CBC CMP; ca-125; no chemo- Dr.B

## 2021-04-21 NOTE — Progress Notes (Signed)
Patient tolerated port flush well today, port flushes well. Blood return noted. No concerns voiced, patient discharged, stable.  

## 2021-04-22 LAB — CA 125: Cancer Antigen (CA) 125: 5 U/mL (ref 0.0–38.1)

## 2021-04-23 ENCOUNTER — Ambulatory Visit: Payer: Medicaid Other | Admitting: Family Medicine

## 2021-04-26 ENCOUNTER — Observation Stay
Admission: RE | Admit: 2021-04-26 | Discharge: 2021-04-28 | Disposition: A | Discharge status: Home / Self Care | Payer: Medicaid Other | Attending: General Surgery | Admitting: General Surgery

## 2021-04-26 ENCOUNTER — Other Ambulatory Visit: Payer: Self-pay

## 2021-04-26 ENCOUNTER — Ambulatory Visit: Payer: Medicaid Other | Admitting: Anesthesiology

## 2021-04-26 ENCOUNTER — Encounter: Payer: Self-pay | Admitting: General Surgery

## 2021-04-26 ENCOUNTER — Ambulatory Visit: Payer: Medicaid Other | Admitting: Urgent Care

## 2021-04-26 ENCOUNTER — Encounter
Admission: RE | Disposition: A | Discharge status: Home / Self Care | Payer: Self-pay | Source: Home / Self Care | Attending: General Surgery

## 2021-04-26 DIAGNOSIS — G8918 Other acute postprocedural pain: Secondary | ICD-10-CM | POA: Diagnosis present

## 2021-04-26 DIAGNOSIS — K436 Other and unspecified ventral hernia with obstruction, without gangrene: Secondary | ICD-10-CM | POA: Diagnosis not present

## 2021-04-26 DIAGNOSIS — I10 Essential (primary) hypertension: Secondary | ICD-10-CM | POA: Insufficient documentation

## 2021-04-26 DIAGNOSIS — Z20822 Contact with and (suspected) exposure to covid-19: Secondary | ICD-10-CM | POA: Diagnosis not present

## 2021-04-26 DIAGNOSIS — F17210 Nicotine dependence, cigarettes, uncomplicated: Secondary | ICD-10-CM | POA: Diagnosis not present

## 2021-04-26 DIAGNOSIS — K439 Ventral hernia without obstruction or gangrene: Principal | ICD-10-CM | POA: Insufficient documentation

## 2021-04-26 DIAGNOSIS — K219 Gastro-esophageal reflux disease without esophagitis: Secondary | ICD-10-CM | POA: Diagnosis not present

## 2021-04-26 DIAGNOSIS — Z8543 Personal history of malignant neoplasm of ovary: Secondary | ICD-10-CM | POA: Insufficient documentation

## 2021-04-26 DIAGNOSIS — Z79899 Other long term (current) drug therapy: Secondary | ICD-10-CM | POA: Diagnosis not present

## 2021-04-26 HISTORY — PX: VENTRAL HERNIA REPAIR: SHX424

## 2021-04-26 HISTORY — PX: INSERTION OF MESH: SHX5868

## 2021-04-26 LAB — SARS CORONAVIRUS 2 BY RT PCR (HOSPITAL ORDER, PERFORMED IN ~~LOC~~ HOSPITAL LAB): SARS Coronavirus 2: NEGATIVE

## 2021-04-26 SURGERY — REPAIR, HERNIA, VENTRAL
Anesthesia: General

## 2021-04-26 MED ORDER — GLYCOPYRROLATE 0.2 MG/ML IJ SOLN
INTRAMUSCULAR | Status: AC
  Filled 2021-04-26: qty 1

## 2021-04-26 MED ORDER — SUMATRIPTAN SUCCINATE 50 MG PO TABS
100.0000 mg | ORAL_TABLET | ORAL | Status: DC | PRN
  Filled 2021-04-26: qty 2

## 2021-04-26 MED ORDER — ONDANSETRON HCL 4 MG/2ML IJ SOLN
INTRAMUSCULAR | Status: DC | PRN
  Administered 2021-04-26: 4 mg via INTRAVENOUS

## 2021-04-26 MED ORDER — ONDANSETRON HCL 4 MG/2ML IJ SOLN
INTRAMUSCULAR | Status: AC
  Filled 2021-04-26: qty 2

## 2021-04-26 MED ORDER — KETAMINE HCL 10 MG/ML IJ SOLN
INTRAMUSCULAR | Status: DC | PRN
  Administered 2021-04-26: 10 mg via INTRAVENOUS
  Administered 2021-04-26: 30 mg via INTRAVENOUS
  Administered 2021-04-26: 10 mg via INTRAVENOUS

## 2021-04-26 MED ORDER — SUCCINYLCHOLINE CHLORIDE 200 MG/10ML IV SOSY
PREFILLED_SYRINGE | INTRAVENOUS | Status: AC
  Filled 2021-04-26: qty 10

## 2021-04-26 MED ORDER — BUSPIRONE HCL 5 MG PO TABS
5.0000 mg | ORAL_TABLET | Freq: Three times a day (TID) | ORAL | Status: DC
  Administered 2021-04-26 – 2021-04-28 (×6): 5 mg via ORAL
  Filled 2021-04-26 (×8): qty 1

## 2021-04-26 MED ORDER — TRAZODONE HCL 50 MG PO TABS
25.0000 mg | ORAL_TABLET | Freq: Every evening | ORAL | Status: DC | PRN
  Administered 2021-04-27: 50 mg via ORAL
  Filled 2021-04-26: qty 1

## 2021-04-26 MED ORDER — OXYCODONE HCL 5 MG PO TABS
5.0000 mg | ORAL_TABLET | ORAL | Status: DC | PRN
  Administered 2021-04-26: 5 mg via ORAL

## 2021-04-26 MED ORDER — PROPOFOL 10 MG/ML IV BOLUS
INTRAVENOUS | Status: AC
  Filled 2021-04-26: qty 40

## 2021-04-26 MED ORDER — DULOXETINE HCL 60 MG PO CPEP
120.0000 mg | ORAL_CAPSULE | Freq: Every day | ORAL | Status: DC
  Administered 2021-04-26 – 2021-04-27 (×2): 120 mg via ORAL
  Filled 2021-04-26 (×2): qty 2
  Filled 2021-04-26 (×2): qty 4
  Filled 2021-04-26: qty 2

## 2021-04-26 MED ORDER — FENTANYL CITRATE (PF) 100 MCG/2ML IJ SOLN
25.0000 ug | INTRAMUSCULAR | Status: DC | PRN
Start: 2021-04-26 — End: 2021-04-26
  Administered 2021-04-26 (×3): 25 ug via INTRAVENOUS

## 2021-04-26 MED ORDER — FENTANYL CITRATE (PF) 100 MCG/2ML IJ SOLN
INTRAMUSCULAR | Status: DC | PRN
  Administered 2021-04-26 (×2): 50 ug via INTRAVENOUS

## 2021-04-26 MED ORDER — DEXMEDETOMIDINE HCL 200 MCG/2ML IV SOLN
INTRAVENOUS | Status: DC | PRN
  Administered 2021-04-26: 8 ug via INTRAVENOUS
  Administered 2021-04-26: 4 ug via INTRAVENOUS
  Administered 2021-04-26: 8 ug via INTRAVENOUS

## 2021-04-26 MED ORDER — PROMETHAZINE HCL 25 MG RE SUPP
25.0000 mg | RECTAL | Status: DC | PRN
  Filled 2021-04-26: qty 1

## 2021-04-26 MED ORDER — DEXTROSE IN LACTATED RINGERS 5 % IV SOLN: INTRAVENOUS | Status: DC

## 2021-04-26 MED ORDER — CHLORHEXIDINE GLUCONATE CLOTH 2 % EX PADS: 6.0000 | MEDICATED_PAD | Freq: Once | CUTANEOUS | Status: DC

## 2021-04-26 MED ORDER — SODIUM CHLORIDE 0.9 % IV SOLN
12.5000 mg | Freq: Four times a day (QID) | INTRAVENOUS | Status: DC | PRN
  Filled 2021-04-26: qty 0.5

## 2021-04-26 MED ORDER — METOPROLOL SUCCINATE ER 25 MG PO TB24
12.5000 mg | ORAL_TABLET | Freq: Every day | ORAL | Status: DC
  Administered 2021-04-27 – 2021-04-28 (×2): 12.5 mg via ORAL
  Filled 2021-04-26 (×2): qty 1

## 2021-04-26 MED ORDER — SUGAMMADEX SODIUM 500 MG/5ML IV SOLN
INTRAVENOUS | Status: AC
  Filled 2021-04-26: qty 5

## 2021-04-26 MED ORDER — LORATADINE 10 MG PO TABS
10.0000 mg | ORAL_TABLET | Freq: Every day | ORAL | Status: DC
  Administered 2021-04-26 – 2021-04-28 (×3): 10 mg via ORAL
  Filled 2021-04-26 (×3): qty 1

## 2021-04-26 MED ORDER — PROPOFOL 10 MG/ML IV BOLUS
INTRAVENOUS | Status: DC | PRN
  Administered 2021-04-26: 170 mg via INTRAVENOUS

## 2021-04-26 MED ORDER — PANTOPRAZOLE SODIUM 40 MG PO TBEC
40.0000 mg | DELAYED_RELEASE_TABLET | Freq: Every day | ORAL | Status: DC
  Administered 2021-04-27 – 2021-04-28 (×2): 40 mg via ORAL
  Filled 2021-04-26 (×2): qty 1

## 2021-04-26 MED ORDER — MIDAZOLAM HCL 2 MG/2ML IJ SOLN
INTRAMUSCULAR | Status: DC | PRN
  Administered 2021-04-26: 2 mg via INTRAVENOUS

## 2021-04-26 MED ORDER — ACETAMINOPHEN 325 MG PO TABS: 650.0000 mg | ORAL_TABLET | ORAL | Status: DC | PRN

## 2021-04-26 MED ORDER — BUPIVACAINE LIPOSOME 1.3 % IJ SUSP
INTRAMUSCULAR | Status: AC
  Filled 2021-04-26: qty 20

## 2021-04-26 MED ORDER — LIDOCAINE HCL (PF) 2 % IJ SOLN
INTRAMUSCULAR | Status: AC
  Filled 2021-04-26: qty 5

## 2021-04-26 MED ORDER — ONDANSETRON 4 MG PO TBDP: 4.0000 mg | ORAL_TABLET | ORAL | Status: DC | PRN

## 2021-04-26 MED ORDER — MIDAZOLAM HCL 2 MG/2ML IJ SOLN
INTRAMUSCULAR | Status: AC
  Filled 2021-04-26: qty 2

## 2021-04-26 MED ORDER — DEXAMETHASONE SODIUM PHOSPHATE 10 MG/ML IJ SOLN
INTRAMUSCULAR | Status: DC | PRN
  Administered 2021-04-26: 5 mg via INTRAVENOUS

## 2021-04-26 MED ORDER — ORAL CARE MOUTH RINSE: 15.0000 mL | Freq: Once | OROMUCOSAL | Status: AC

## 2021-04-26 MED ORDER — BUPIVACAINE HCL (PF) 0.5 % IJ SOLN
INTRAMUSCULAR | Status: AC
  Filled 2021-04-26: qty 30

## 2021-04-26 MED ORDER — CEFAZOLIN IN SODIUM CHLORIDE 3-0.9 GM/100ML-% IV SOLN
3.0000 g | INTRAVENOUS | Status: AC
  Administered 2021-04-26: 3 g via INTRAVENOUS
  Filled 2021-04-26: qty 100

## 2021-04-26 MED ORDER — ONDANSETRON HCL 4 MG/2ML IJ SOLN: 4.0000 mg | Freq: Once | INTRAMUSCULAR | Status: DC | PRN

## 2021-04-26 MED ORDER — HYDROCODONE-ACETAMINOPHEN 5-325 MG PO TABS
1.0000 | ORAL_TABLET | ORAL | Status: DC | PRN
Start: 2021-04-26 — End: 2021-04-28
  Administered 2021-04-26 – 2021-04-28 (×7): 1 via ORAL
  Filled 2021-04-26 (×7): qty 1

## 2021-04-26 MED ORDER — SUGAMMADEX SODIUM 200 MG/2ML IV SOLN
INTRAVENOUS | Status: DC | PRN
  Administered 2021-04-26: 200 mg via INTRAVENOUS

## 2021-04-26 MED ORDER — KETOROLAC TROMETHAMINE 30 MG/ML IJ SOLN
INTRAMUSCULAR | Status: DC | PRN
  Administered 2021-04-26: 30 mg via INTRAVENOUS

## 2021-04-26 MED ORDER — CHLORHEXIDINE GLUCONATE 0.12 % MT SOLN
15.0000 mL | Freq: Once | OROMUCOSAL | Status: AC
  Administered 2021-04-26: 15 mL via OROMUCOSAL

## 2021-04-26 MED ORDER — BUPIVACAINE-EPINEPHRINE (PF) 0.5% -1:200000 IJ SOLN
INTRAMUSCULAR | Status: AC
  Filled 2021-04-26: qty 30

## 2021-04-26 MED ORDER — MORPHINE SULFATE (PF) 2 MG/ML IV SOLN
2.0000 mg | INTRAVENOUS | Status: DC | PRN
Start: 2021-04-26 — End: 2021-04-28
  Administered 2021-04-26 – 2021-04-27 (×6): 2 mg via INTRAVENOUS
  Filled 2021-04-26 (×6): qty 1

## 2021-04-26 MED ORDER — BUPIVACAINE LIPOSOME 1.3 % IJ SUSP
INTRAMUSCULAR | Status: DC | PRN
  Administered 2021-04-26: 20 mL

## 2021-04-26 MED ORDER — SUCCINYLCHOLINE CHLORIDE 20 MG/ML IJ SOLN
INTRAMUSCULAR | Status: DC | PRN
  Administered 2021-04-26: 120 mg via INTRAVENOUS

## 2021-04-26 MED ORDER — ENOXAPARIN SODIUM 40 MG/0.4ML IJ SOSY
40.0000 mg | PREFILLED_SYRINGE | INTRAMUSCULAR | Status: DC
  Administered 2021-04-27 – 2021-04-28 (×2): 40 mg via SUBCUTANEOUS
  Filled 2021-04-26 (×2): qty 0.4

## 2021-04-26 MED ORDER — BUPIVACAINE HCL (PF) 0.5 % IJ SOLN
INTRAMUSCULAR | Status: DC | PRN
  Administered 2021-04-26: 30 mL

## 2021-04-26 MED ORDER — PROMETHAZINE HCL 25 MG PO TABS: 12.5000 mg | ORAL_TABLET | Freq: Four times a day (QID) | ORAL | Status: DC | PRN

## 2021-04-26 MED ORDER — CLONIDINE HCL 0.1 MG PO TABS
0.1000 mg | ORAL_TABLET | Freq: Every day | ORAL | Status: DC
  Administered 2021-04-26 – 2021-04-27 (×2): 0.1 mg via ORAL
  Filled 2021-04-26 (×2): qty 1

## 2021-04-26 MED ORDER — ROCURONIUM BROMIDE 10 MG/ML (PF) SYRINGE
PREFILLED_SYRINGE | INTRAVENOUS | Status: AC
  Filled 2021-04-26: qty 10

## 2021-04-26 MED ORDER — DEXMEDETOMIDINE (PRECEDEX) IN NS 20 MCG/5ML (4 MCG/ML) IV SYRINGE
PREFILLED_SYRINGE | INTRAVENOUS | Status: AC
  Filled 2021-04-26: qty 5

## 2021-04-26 MED ORDER — GLYCOPYRROLATE 0.2 MG/ML IJ SOLN
INTRAMUSCULAR | Status: DC | PRN
  Administered 2021-04-26: .2 mg via INTRAVENOUS

## 2021-04-26 MED ORDER — KETOROLAC TROMETHAMINE 30 MG/ML IJ SOLN
INTRAMUSCULAR | Status: AC
  Filled 2021-04-26: qty 1

## 2021-04-26 MED ORDER — LORAZEPAM 0.5 MG PO TABS: 0.5000 mg | ORAL_TABLET | Freq: Three times a day (TID) | ORAL | Status: DC | PRN

## 2021-04-26 MED ORDER — OXYCODONE HCL 5 MG PO TABS
ORAL_TABLET | ORAL | Status: AC
  Filled 2021-04-26: qty 1

## 2021-04-26 MED ORDER — FENTANYL CITRATE (PF) 100 MCG/2ML IJ SOLN
INTRAMUSCULAR | Status: AC
  Filled 2021-04-26: qty 2

## 2021-04-26 MED ORDER — CLONIDINE HCL 0.3 MG/24HR TD PTWK: 0.3000 mg | MEDICATED_PATCH | TRANSDERMAL | Status: DC

## 2021-04-26 MED ORDER — ACETAMINOPHEN 10 MG/ML IV SOLN
INTRAVENOUS | Status: AC
  Filled 2021-04-26: qty 100

## 2021-04-26 MED ORDER — LACTATED RINGERS IV SOLN: INTRAVENOUS | Status: DC

## 2021-04-26 MED ORDER — KETAMINE HCL 50 MG/5ML IJ SOSY
PREFILLED_SYRINGE | INTRAMUSCULAR | Status: AC
  Filled 2021-04-26: qty 5

## 2021-04-26 MED ORDER — PREGABALIN 75 MG PO CAPS
75.0000 mg | ORAL_CAPSULE | Freq: Three times a day (TID) | ORAL | Status: DC
Start: 2021-04-26 — End: 2021-04-28
  Administered 2021-04-26 – 2021-04-28 (×6): 75 mg via ORAL
  Filled 2021-04-26 (×6): qty 1

## 2021-04-26 MED ORDER — ROCURONIUM BROMIDE 100 MG/10ML IV SOLN
INTRAVENOUS | Status: DC | PRN
  Administered 2021-04-26 (×2): 20 mg via INTRAVENOUS
  Administered 2021-04-26 (×2): 10 mg via INTRAVENOUS
  Administered 2021-04-26: 30 mg via INTRAVENOUS

## 2021-04-26 MED ORDER — PROCHLORPERAZINE MALEATE 10 MG PO TABS
10.0000 mg | ORAL_TABLET | Freq: Four times a day (QID) | ORAL | Status: DC | PRN
  Filled 2021-04-26: qty 1

## 2021-04-26 MED ORDER — CHLORHEXIDINE GLUCONATE 0.12 % MT SOLN
OROMUCOSAL | Status: AC
  Filled 2021-04-26: qty 15

## 2021-04-26 MED ORDER — ACETAMINOPHEN 10 MG/ML IV SOLN
INTRAVENOUS | Status: DC | PRN
  Administered 2021-04-26: 1000 mg via INTRAVENOUS

## 2021-04-26 MED ORDER — FENTANYL CITRATE (PF) 100 MCG/2ML IJ SOLN
INTRAMUSCULAR | Status: AC
  Administered 2021-04-26: 25 ug via INTRAVENOUS
  Filled 2021-04-26: qty 2

## 2021-04-26 SURGICAL SUPPLY — 45 items
APL PRP STRL LF DISP 70% ISPRP (MISCELLANEOUS) ×1
APL SKNCLS STERI-STRIP NONHPOA (GAUZE/BANDAGES/DRESSINGS)
BENZOIN TINCTURE PRP APPL 2/3 (GAUZE/BANDAGES/DRESSINGS) IMPLANT
BLADE SURG 15 STRL SS SAFETY (BLADE) ×2 IMPLANT
BULB RESERV EVAC DRAIN JP 100C (MISCELLANEOUS) ×2 IMPLANT
CHLORAPREP W/TINT 26 (MISCELLANEOUS) ×2 IMPLANT
COVER WAND RF STERILE (DRAPES) ×2 IMPLANT
DRAIN CHANNEL JP 15F RND 16 (MISCELLANEOUS) ×2 IMPLANT
DRAPE LAPAROTOMY 100X77 ABD (DRAPES) ×2 IMPLANT
DRSG TEGADERM 4X4.75 (GAUZE/BANDAGES/DRESSINGS) ×2 IMPLANT
DRSG TELFA 3X8 NADH (GAUZE/BANDAGES/DRESSINGS) IMPLANT
ELECT REM PT RETURN 9FT ADLT (ELECTROSURGICAL) ×2
ELECTRODE REM PT RTRN 9FT ADLT (ELECTROSURGICAL) ×1 IMPLANT
GAUZE SPONGE 4X4 12PLY STRL (GAUZE/BANDAGES/DRESSINGS) IMPLANT
GLOVE SURG ENC MOIS LTX SZ7.5 (GLOVE) ×2 IMPLANT
GLOVE SURG UNDER LTX SZ8 (GLOVE) ×2 IMPLANT
GOWN STRL REUS W/ TWL LRG LVL3 (GOWN DISPOSABLE) ×2 IMPLANT
GOWN STRL REUS W/TWL LRG LVL3 (GOWN DISPOSABLE) ×6
GRASPER SUT TROCAR 14GX15 (MISCELLANEOUS) ×2 IMPLANT
KIT TURNOVER KIT A (KITS) ×2 IMPLANT
LABEL OR SOLS (LABEL) ×2 IMPLANT
MANIFOLD NEPTUNE II (INSTRUMENTS) ×2 IMPLANT
MESH BARD SOFT 6X6IN (Mesh General) ×1 IMPLANT
NEEDLE HYPO 22GX1.5 SAFETY (NEEDLE) ×2 IMPLANT
NEEDLE HYPO 25X1 1.5 SAFETY (NEEDLE) ×2 IMPLANT
NS IRRIG 500ML POUR BTL (IV SOLUTION) ×2 IMPLANT
PACK BASIN MINOR ARMC (MISCELLANEOUS) ×2 IMPLANT
PAD DRESSING TELFA 3X8 NADH (GAUZE/BANDAGES/DRESSINGS) ×1 IMPLANT
RETAINER VISCERA MED (MISCELLANEOUS) ×2 IMPLANT
SPONGE LAP 18X18 RF (DISPOSABLE) ×4 IMPLANT
STAPLER SKIN PROX 35W (STAPLE) IMPLANT
STRIP CLOSURE SKIN 1/2X4 (GAUZE/BANDAGES/DRESSINGS) ×1 IMPLANT
SUT ETHILON 3-0 FS-10 30 BLK (SUTURE) ×2
SUT MAXON ABS #0 GS21 30IN (SUTURE) ×8 IMPLANT
SUT SURGILON 0 BLK (SUTURE) ×2 IMPLANT
SUT VIC AB 2-0 BRD 54 (SUTURE) ×2 IMPLANT
SUT VIC AB 2-0 CT1 (SUTURE) ×8 IMPLANT
SUT VIC AB 3-0 SH 27 (SUTURE) ×2
SUT VIC AB 3-0 SH 27X BRD (SUTURE) ×1 IMPLANT
SUT VIC AB 4-0 FS2 27 (SUTURE) ×2 IMPLANT
SUT VICRYL+ 3-0 144IN (SUTURE) ×2 IMPLANT
SUTURE EHLN 3-0 FS-10 30 BLK (SUTURE) IMPLANT
SYR 10ML LL (SYRINGE) ×2 IMPLANT
SYR 3ML LL SCALE MARK (SYRINGE) ×2 IMPLANT
TRAY FOLEY MTR SLVR 16FR STAT (SET/KITS/TRAYS/PACK) IMPLANT

## 2021-04-26 NOTE — Transfer of Care (Signed)
Immediate Anesthesia Transfer of Care Note  Patient: [name]  Procedure(s) Performed: HERNIA REPAIR VENTRAL ADULT (N/A ) INSERTION OF MESH (N/A )  Patient Location: PACU  Anesthesia Type:General  Level of Consciousness: sedated  Airway & Oxygen Therapy: Patient Spontanous Breathing and Patient connected to face mask oxygen  Post-op Assessment: Report given to RN and Post -op Vital signs reviewed and stable  Post vital signs: Reviewed  Last Vitals:  Vitals Value Taken Time  BP 101/58 04/26/21 1321  Temp    Pulse 55 04/26/21 1322  Resp 14 04/26/21 1322  SpO2 98 % 04/26/21 1322  Vitals shown include unvalidated device data.  Last Pain:  Vitals:   04/26/21 0856  TempSrc: Temporal  PainSc: 0-No pain         Complications: No complications documented.

## 2021-04-26 NOTE — Anesthesia Procedure Notes (Signed)
Procedure Name: Intubation Performed by: Rolla Plate, CRNA Pre-anesthesia Checklist: Patient identified, Patient being monitored, Timeout performed, Emergency Drugs available and Suction available Patient Re-evaluated:Patient Re-evaluated prior to induction Oxygen Delivery Method: Circle system utilized Preoxygenation: Pre-oxygenation with 100% oxygen Induction Type: IV induction Laryngoscope Size: Mac and 3 Grade View: Grade I Tube type: Oral Tube size: 7.0 mm Number of attempts: 1 Airway Equipment and Method: Stylet Placement Confirmation: ETT inserted through vocal cords under direct vision,  positive ETCO2 and breath sounds checked- equal and bilateral Secured at: 22 cm Tube secured with: Tape Dental Injury: Teeth and Oropharynx as per pre-operative assessment
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
## 2021-04-26 NOTE — Anesthesia Postprocedure Evaluation (Signed)
Anesthesia Post Note  Patient: NEHEMIAH MONTEE  Procedure(s) Performed: HERNIA REPAIR VENTRAL ADULT (N/A ) INSERTION OF MESH (N/A )  Patient location during evaluation: PACU Anesthesia Type: General Level of consciousness: awake and alert Pain management: pain level controlled Vital Signs Assessment: post-procedure vital signs reviewed and stable Respiratory status: spontaneous breathing, nonlabored ventilation, respiratory function stable and patient connected to nasal cannula oxygen Cardiovascular status: blood pressure returned to baseline and stable Postop Assessment: no apparent nausea or vomiting Anesthetic complications: no   No complications documented.   Last Vitals:  Vitals:   04/26/21 0856 04/26/21 1324  BP: (!) 133/95   Pulse: (!) 51 63  Resp: 19   Temp: 37 C 37 C  SpO2: 100%     Last Pain:  Vitals:   04/26/21 1324  TempSrc:   PainSc: Asleep                 Molli Barrows

## 2021-04-26 NOTE — Anesthesia Preprocedure Evaluation (Addendum)
Anesthesia Evaluation  Patient identified by MRN, date of birth, ID band Patient awake    Reviewed: Allergy & Precautions, H&P , NPO status , Patient's Chart, lab work & pertinent test results, reviewed documented beta blocker date and time   Airway Mallampati: II  TM Distance: >3 FB Neck ROM: full    Dental  (+) Teeth Intact   Pulmonary neg pulmonary ROS, Current Smoker,    Pulmonary exam normal        Cardiovascular Exercise Tolerance: Good hypertension, On Medications negative cardio ROS Normal cardiovascular exam+ dysrhythmias  Rhythm:regular Rate:Normal     Neuro/Psych  Headaches, PSYCHIATRIC DISORDERS Depression negative neurological ROS  negative psych ROS   GI/Hepatic negative GI ROS, Neg liver ROS, GERD  ,  Endo/Other  negative endocrine ROS  Renal/GU negative Renal ROS  negative genitourinary   Musculoskeletal  (+) Arthritis ,   Abdominal   Peds  Hematology negative hematology ROS (+)   Anesthesia Other Findings Past Medical History: 06/18/2018: BRCA1 positive     Comment:  Pathogenic BRCA1 mutation at [facility] No date: Cancer associated pain 12/11/2019: 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 No date: Dysrhythmia No date: Family history of breast cancer No date: GERD (gastroesophageal reflux disease) No date: Hypertension No date: Menorrhagia No date: Migraines No date: Osteoarthritis     Comment:  back 12/10/2019: Ovarian cancer ([place]) No date: Personal history of chemotherapy     Comment:  ovarian cancer No date: Plantar fasciitis Past Surgical History: 03/2020: ABDOMINAL HYSTERECTOMY No date: APPENDECTOMY     Comment:  LSC but "ruptured when they did the surgery" 01/04/2021: BREAST BIOPSY; Left     Comment:  MRI BX No date:  CESAREAN SECTION 04/08/2020: CYSTOSCOPY; N/A     Comment:  Procedure: CYSTOSCOPY;  [name], MD;  [name];  Service: Gynecology;                [name]; 12/06/2019: IR THORACENTESIS ASP PLEURAL SPACE W/IMG GUIDE 04/08/2020: IUD REMOVAL; N/A     Comment:  Procedure: INTRAUTERINE DEVICE (IUD) REMOVAL;  Surgeon:               Gillis Ends, MD;  [name];                Service: Gynecology;  [name]; No date: PARACENTESIS     Comment:  x6 04/23/2020: PORTA CATH INSERTION; N/A     Comment:  Procedure: PORTA CATH INSERTION;  [name],              MD;  Location: [place] [facility];  Service:               Cardiovascular;  [name]; No date: TUBAL LIGATION     Comment:  at time of CSxn 11/21/2016: WRIST SURGERY; Left     Comment:  plates and screws inserted BMI    Body Mass Index: 46.66 kg/m     Reproductive/Obstetrics negative OB ROS                            Anesthesia Physical Anesthesia Plan  ASA: III  Anesthesia Plan: General ETT  Post-op Pain Management:    Induction:   PONV Risk Score and Plan: 3  Airway Management Planned:   Additional Equipment:   Intra-op Plan:   Post-operative Plan:   Informed Consent: I have reviewed the patients History and Physical, chart, labs and discussed the procedure including the risks, benefits and alternatives for the proposed anesthesia with the patient or authorized representative who has indicated his/her understanding and acceptance.     Dental Advisory Given  Plan Discussed with: CRNA  Anesthesia Plan Comments: (Pt consented for TAP block for post op pain relief.  Risks and benefits accepted. ja)       Anesthesia Quick Evaluation

## 2021-04-26 NOTE — Op Note (Signed)
Preoperative diagnosis: Ventral hernia post robotic hysterectomy.  Postoperative diagnosis: Same.  Operative procedure: Repair of bilobed ventral hernia with component separation, 6 x 6 inch Bard soft mesh in retrorectus space.  Operating surgeon: Hervey Ard, MD.  Assistant: Elsie Stain, RNFA.  Anesthesia: General endotracheal, TAPP block.  Estimated blood loss: 50 cc.  Clinical note: This 50 year old woman had previously undergone a robotic hysterectomy with salpingo-oophorectomy and omentectomy for ovarian cancer.  She is developed a hernia above the umbilicus.  CT scan showed multiple loops of bowel just below the skin.  Clinical exam suggest a bilobed process.  She is brought to the operating room for planned repair.  SCD stockings for DVT prevention.  Ancef 3 g intravenously for antibiotic prophylaxis.  Operative Note: The patient underwent general endotracheal anesthesia followed by placement of a tapp block.  The abdomen was cleansed with ChloraPrep and draped.  An incision was made over the original midline incision above the umbilicus and extended about 5 cm cephalad to allow entrance of the abdomen and virgin tissue.  Once the fascia was exposed approximately 6 cm below the skin the fascia was carefully opened followed by the peritoneum.  There were no adhesions to the anterior abdominal wall or within the hernia sac.  The skin incision was then opened in its entirety down below the umbilicus as indeed there was a bilobed component with a dense band of fascia about 5 mm diameter separating the 2 lobes of the hernia sac.  Once the area had been fully exposed the vast majority of the hernia sac was excised with electrocautery.  The fascial defect was approximately 4-5 cm in width and approximately 10 cm in length.  The posterior rectus sheath was incised and then mobilized to the edge of the rectus.  There was some scarring on the right side that did not appear related to previous  surgical procedure.  Once the rectus muscle had been mobilized the posterior rectus sheath was approximated in the midline with interrupted 0 Vicryl figure-of-eight sutures.  Once this was done a 6 x 6 inch piece of Bard soft mesh was brought on the field.  This was anchored with abdominal wall sutures x3 on the left side.  The mesh was then made taunt and midline sutures with 0 Maxon were placed under direct vision.  The 0 Maxon sutures on the right lateral wall were then placed and the patient was well with the PMI suture passer.  Once this was done a Keenan Bachelor drain was brought out through a separate stab wound incision and anchored in place with a 3-0 nylon suture.  The midline fascia was then approximated with interrupted 0 Maxon sutures without difficulty.  Each suture was used to grasp the underlying mesh.  Stability.  The adipose layer was closed with 2 layers of running 2-0 Vicryl suture.  The skin was closed with staples as were the drains abdominal wall suture sites.  A honeycomb dressing was placed and the patient extubated deep without coughing.  An abdominal binder was placed and she was transferred to the bed without incident.  She was taken to recovery in stable condition.

## 2021-04-26 NOTE — Care Management (Signed)
Ronda with [employer] to deliver RW to room prior to discharge

## 2021-04-26 NOTE — Anesthesia Procedure Notes (Signed)
Anesthesia Regional Block: TAP block   Pre-Anesthetic Checklist: ,, timeout performed, Correct Patient, Correct Site, Correct Laterality, Correct Procedure, Correct Position, site marked, Risks and benefits discussed,  Surgical consent,  Pre-op evaluation,  At surgeon's request and post-op pain management  Laterality: Left and Right  Prep: chloraprep       Needles:  Injection technique: Single-shot  Needle Type: Stimiplex     Needle Length: 9cm  Needle Gauge: 21     Additional Needles:   Procedures:,,,, ultrasound used (permanent image in chart),,,,  Narrative:  Start time: 04/26/2021 10:15 AM End time: 04/26/2021 10:20 AM Injection made incrementally with aspirations every 5 mL.  Performed by: Personally  Anesthesiologist: Arita Miss, MD  Additional Notes: Patient's chart reviewed and they were deemed appropriate candidate for procedure, per surgeon's request. Patient educated about risks, benefits, and alternatives of the block including but not limited to: temporary or permanent nerve damage, bleeding, infection, damage to surround tissues, block failure, local anesthetic toxicity. Patient expressed understanding. A formal time-out was conducted consistent with institution rules.  Block placed after induction of general anesthesia and intubation. The site was prepped with skin prep and allowed to dry, and sterile gloves were used. A high frequency linear ultrasound probe with probe cover was utilized throughout. Fascial plane between internal oblique and transversus abdominus muscles identified and targeted with needle, and echogenic block needle trajectory was monitored throughout. Planes looked anatomically normal. Hydrodissection of tissue plane was observed. Aspiration performed every 59ml. Blood vessels and bowel were avoided. All injections were performed without resistance and free of blood and paresthesias. The patient tolerated the procedure well.  Injectate: LEFT - 43ml  exparel + 18ml 0.5% bupivacaine RIGHT - 42ml exparel + 33ml 0.5% bupivacaine

## 2021-04-27 ENCOUNTER — Encounter: Payer: Self-pay | Admitting: General Surgery

## 2021-04-27 DIAGNOSIS — K439 Ventral hernia without obstruction or gangrene: Secondary | ICD-10-CM | POA: Diagnosis not present

## 2021-04-27 MED ORDER — POLYETHYLENE GLYCOL 3350 17 G PO PACK
17.0000 g | PACK | Freq: Every day | ORAL | Status: DC
  Administered 2021-04-27 – 2021-04-28 (×2): 17 g via ORAL
  Filled 2021-04-27 (×2): qty 1

## 2021-04-27 MED ORDER — CHLORHEXIDINE GLUCONATE CLOTH 2 % EX PADS
6.0000 | MEDICATED_PAD | Freq: Every day | CUTANEOUS | Status: DC
  Administered 2021-04-27: 6 via TOPICAL

## 2021-04-27 NOTE — Progress Notes (Signed)
Did well with diet advance. Has ambulated twice.  Inspirex at 1000 this afternoon.  Discussed d/c this PM or tomorrow AM.  Will have her work on ambulation and pulmonary toilet this evening. Home in AM>

## 2021-04-27 NOTE — Progress Notes (Signed)
AVSS.  No nausea. Ready for diet advance.  Pain under reasonable control w/ po and IV meds. Improved mobility (up out of bed on her own) but did not ambulate yesterday. Lungs: Clear. Cardio: RR. ABD: Non-distended, soft. Wound: Clean and dry. JP: Sero sang. Inspirex at 1200. Extrem: Soft. IMP: Doing well.  Plan: Not quite ready for d/c, hopefully this PM after she ambulates.

## 2021-04-27 NOTE — Progress Notes (Signed)
Mobility Specialist - Progress Note   04/27/21 1700  Mobility  Activity Ambulated in hall  Level of Assistance Standby assist, set-up cues, supervision of patient - no hands on  Assistive Device None (pushed IV Pole)  Distance Ambulated (ft) 375 ft  Mobility Response Tolerated well  Mobility performed by Mobility specialist  $Mobility charge 1 Mobility    Pre-mobility: 70 HR, 91% SpO2 During mobility: 83 HR, 88% SpO2 Post-mobility: 66 HR, 93% SpO2  Pt ambulated in hallway without AD, pushing IV pole. No LOB. MinA and extra time to sit EOB. Supervised transfers/AMB. Did voice pain at incision site 5/10 that did increase to 7/10 during ambulation. Denied SOB on RA. O2 desat to a low of 87% during activity. Educated pt on log rolling technique to return supine for pain management, implemented with supervision and extra time. Pt polite and motivated for session.     Kathee Delton Mobility Specialist 04/27/21, 5:10 PM

## 2021-04-28 ENCOUNTER — Inpatient Hospital Stay: Payer: Medicaid Other | Admitting: Hospice and Palliative Medicine

## 2021-04-28 DIAGNOSIS — K439 Ventral hernia without obstruction or gangrene: Secondary | ICD-10-CM | POA: Diagnosis not present

## 2021-04-28 MED ORDER — CHLORHEXIDINE GLUCONATE CLOTH 2 % EX PADS
6.0000 | MEDICATED_PAD | Freq: Every day | CUTANEOUS | Status: DC
  Administered 2021-04-28: 6 via TOPICAL

## 2021-04-28 MED ORDER — OXYCODONE HCL 5 MG PO TABS: 2.5000 mg | ORAL_TABLET | Freq: Four times a day (QID) | ORAL | 0 refills | Status: DC | PRN

## 2021-04-28 NOTE — Progress Notes (Signed)
Patient ambulated twice around unit this morning. Incentive spirometer at 1000, trying to reach goal of 1500. States he daughter can pick up for discharge today at 6pm. Pain controlled by morphine and Vicodin.

## 2021-04-28 NOTE — Final Progress Note (Signed)
AVSS. Tolerated diet well.  Ambulated as requested. Inspirex at 1500 (goal). Minimal pain. Lungs: Clear. Sats: 94%. Cardio: RR. ABD: Soft. Wound: Clean and dry. Drain volume down, removed. Extrem: Soft. Doing well.  Encouraged patient to follow up w/ PCP re: med review.

## 2021-04-29 ENCOUNTER — Other Ambulatory Visit: Payer: Self-pay | Admitting: Emergency Medicine

## 2021-04-29 DIAGNOSIS — R0683 Snoring: Secondary | ICD-10-CM

## 2021-05-04 ENCOUNTER — Ambulatory Visit: Payer: Medicaid Other | Admitting: Family

## 2021-05-04 NOTE — Progress Notes (Deleted)
Office Visit    Patient Name: [name] Date of Encounter: 05/04/2021  PCP:  Steele Sizer, MD   [facility]  Cardiologist:  Kate Sable, MD  Advanced Practice Provider:  No care team member to display Electrophysiologist:  None    Chief Complaint    [name] is a 50 y.o. 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 Cancer associated pain   . Cancer of bronchus of right upper lobe ([place]) 12/11/2019  . Clotting disorder (HCC)    Right arm blood clot when she started Chemo.  . Depression   . Drug-induced androgenic alopecia   . Dysrhythmia   . Family history of breast cancer   . GERD (gastroesophageal reflux disease)   . Hypertension   . Menorrhagia   . Migraines   . Osteoarthritis    back  . Ovarian cancer ([place]) 12/10/2019  . Personal history of chemotherapy    ovarian cancer  . Plantar fasciitis    Past Surgical History:  Procedure Laterality Date  . ABDOMINAL HYSTERECTOMY  03/2020  . APPENDECTOMY     LSC but "ruptured when they did the surgery"  . BREAST BIOPSY Left 01/04/2021   MRI BX  . CESAREAN SECTION    . CYSTOSCOPY N/A 04/08/2020   Procedure: CYSTOSCOPY;  [name], MD;  [name];  Service: Gynecology;  [name];  . INSERTION OF MESH N/A 04/26/2021   Procedure: INSERTION OF MESH;  [name], MD;  [name];  Service: General;  [name];  . IR THORACENTESIS ASP PLEURAL SPACE W/IMG GUIDE  12/06/2019  . IUD REMOVAL N/A 04/08/2020   Procedure: INTRAUTERINE DEVICE (IUD) REMOVAL;  [name], MD;  Location:  [facility] OR[facility];  Service: Gynecology;  [name];  . PARACENTESIS     x6  . PORTA CATH INSERTION N/A 04/23/2020   Procedure: PORTA CATH INSERTION;  [name], MD;  Location: [place] [facility];  Service: Cardiovascular;  [name];  . TUBAL LIGATION     at time of CSxn  . VENTRAL HERNIA REPAIR N/A 04/26/2021   Procedure: HERNIA REPAIR VENTRAL ADULT;  [name], MD;  [name];  Service: General;  [name];  need RNFA for the case  . WRIST SURGERY Left 11/21/2016   plates and screws inserted    Allergies  No Known Allergies  History of Present Illness    [name] is a 50 y.o. female with a hx of *** last seen 03/30/2020 by Dr Garen Lah.  When last seen 03/29/2020 due to finding of cardiomegaly on chest x-ray she was recommended for echocardiogram.  Echocardiogram 04/02/2020 normal LVEF 60 to 65%, no wall motion abnormalities, normal diastolic parameters, RV normal size and function, normal PASP, no significant valve abnormalities.  CT chest, abdomen, pelvis 12/28/20 with no atherosclerotic calcification in thoracic aorta or coronary arteries.   She underwent ventral hernia repair 04/26/2021. A split night sleep study has been ordered for her by primary care due to interrupted sleep and hypersomnolence. Plan is to resume bevacizumab and olaparib maintenance after recovery from hernia surgery. She has  been maintained on clonidine patches with tablets by PCP for vasomotor symptoms.   EKGs/Labs/Other Studies Reviewed:   The following studies were reviewed today:  Echo 04/02/20  IMPRESSIONS     1. Left ventricular ejection fraction, by estimation, is 60 to 65%. The  left ventricle has normal function. The left ventricle has no regional  wall motion abnormalities. Left ventricular diastolic parameters were  normal.   2. Right ventricular systolic function is normal. The right ventricular  size is normal. There is normal pulmonary  artery systolic pressure.   3. The mitral valve is normal in structure. No evidence of mitral valve  regurgitation. No evidence of mitral stenosis.   4. The aortic valve is normal in structure. Aortic valve regurgitation is  not visualized. No aortic stenosis is present.   5. The inferior vena cava is normal in size with greater than 50%  respiratory variability, suggesting right atrial pressure of 3 mmHg.    EKG:  EKG is *** ordered today.  The ekg ordered today demonstrates ***  Recent Labs: 01/14/2021: TSH 9.86 04/21/2021: ALT 15; BUN 13; Creatinine, Ser 0.75; Hemoglobin 12.7; Platelets 163; Potassium 3.7; Sodium 138  Recent Lipid Panel    Component Value Date/Time   CHOL 219 (H) 01/14/2021 1230   TRIG 294 (H) 01/14/2021 1230   HDL 46 (L) 01/14/2021 1230   CHOLHDL 4.8 01/14/2021 1230   VLDL 13.8 12/23/2016 1459   LDLCALC 131 (H) 01/14/2021 1230    Home Medications   No outpatient medications have been marked as taking for the 05/04/21 encounter (Appointment) with Loel Dubonnet, NP.     Review of Systems  All other systems reviewed and are otherwise negative except as noted above.  Physical Exam    VS:  LMP 06/19/2017  , BMI There is no height or weight on file to calculate BMI.  Wt Readings from Last 3 Encounters:  04/26/21 263 lb 6.4 oz (119.5 kg)  04/21/21 263 lb 6.4 oz (119.5 kg)  02/26/21 270 lb (122.5 kg)     GEN: Well nourished, well developed, in no acute distress. HEENT: normal. Neck: Supple, no JVD, carotid bruits, or masses. Cardiac: ***RRR, no murmurs, rubs, or gallops. No clubbing, cyanosis, edema.  ***Radials/DP/PT 2+ and equal bilaterally.  Respiratory:  ***Respirations regular and unlabored, clear to auscultation bilaterally. GI: Soft, nontender, nondistended. MS: No deformity or atrophy. Skin: Warm and dry, no rash. Neuro:  Strength and sensation are intact. Psych: Normal affect.  Assessment & Plan    1. ***  Disposition: Follow up {follow  up:15908} with ***   Signed, Loel Dubonnet, NP 05/04/2021, 1:36 PM [facility]

## 2021-05-05 ENCOUNTER — Encounter: Payer: Self-pay | Admitting: Family

## 2021-05-06 NOTE — Discharge Summary (Signed)
Physician Discharge Summary  Patient ID: [name] MRN: [id number] DOB/AGE: 1971-07-08 50 y.o.  Admit date: 04/26/2021 Discharge date: 05/06/2021  Admission Diagnoses: Ventral hernia   Discharge Diagnoses:  Active Problems:   Post-op pain   Ventral hernia without obstruction or gangrene   Discharged Condition: good  Hospital Course: Admitted for post op pain control. Did well.  No ileus.    Consults: None  Significant Diagnostic Studies: none  Treatments: IV hydration  Discharge Exam: Blood pressure 116/66, pulse 67, temperature 97.8 F (36.6 C), temperature source Oral, resp. rate 18, weight 119.5 kg, last menstrual period 06/19/2017, SpO2 93 %. General appearance: alert and cooperative Resp: clear to auscultation bilaterally Cardio: regular rate and rhythm, S1, S2 normal, no murmur, click, rub or gallop GI: soft, non-tender; bowel sounds normal; no masses,  no organomegaly Incision/Wound: Healing well. Drain removed.   Disposition: Discharge disposition: 01-Home or Self Care       Discharge Instructions    Diet - low sodium heart healthy   Complete by: As directed    Discharge instructions   Complete by: As directed    Ambulate at home frequently.  Avoid lifting over 10 pounds for the near future.  No driving until pain free.  OK to shower.  Wear the binder at all times (except showers). Place a hand towel between the binder and skin for comfort.   Tylenol/ Advil/ Aleve: If needed for soreness.  Oxycodone: 1/2 tablet every 6 hours if needed for pain. This medication may constipate.  Continue daily Miralax to control constipation.  Contact Dr. Ancil Boozer for a sitdown review of medications.  NO SMOKING. (Please).  Use walker as needed for safety.   Increase activity slowly   Complete by: As directed    No wound care   Complete by: As directed      Allergies as of 04/28/2021   No Known Allergies     Medication List    TAKE these medications    busPIRone 5 MG tablet Commonly known as: BUSPAR Take 1-2 tablets (5-10 mg total) by mouth 3 (three) times daily as needed. What changed:   how much to take  when to take this  reasons to take this   cloNIDine 0.1 MG tablet Commonly known as: CATAPRES Take 1 tablet (0.1 mg total) by mouth at bedtime.   cloNIDine 0.3 mg/24hr patch Commonly known as: CATAPRES - Dosed in mg/24 hr Place 1 patch (0.3 mg total) onto the skin once a week.   DULoxetine 60 MG capsule Commonly known as: CYMBALTA TAKE 2 CAPSULES( 120 MG TOTAL) BY MOUTH AT BEDTIME What changed: See the new instructions.   lidocaine-prilocaine cream Commonly known as: EMLA Apply 1 application topically as needed. What changed: reasons to take this   loratadine 10 MG tablet Commonly known as: CLARITIN TAKE 1 TABLET(10 MG) BY MOUTH EVERY MORNING What changed: See the new instructions.   LORazepam 0.5 MG tablet Commonly known as: ATIVAN Take 1 tablet (0.5 mg total) by mouth every 8 (eight) hours as needed for anxiety.   Lynparza 150 MG tablet Generic drug: olaparib TAKE 1 TAB BY MOUTH TWICE A DAY [ALONG WITH 100 MG PILL- TOTAL 250 MG TWICE A DAY]. SWALLOW WHOLE. MAY TAKE WITH FOOD TO DECREASE NAUSEA AND VOMITING   Lynparza 100 MG tablet Generic drug: olaparib TAKE 1 TAB BY MOUTH TWICE A DAY [ALONG WITH 150MG  PILL- TOTAL 250 MG TWICE A DAY]. SWALLOW WHOLE. MAY TAKE WITH FOOD TO DECREASE NAUSEA  AND VOMITING   metoprolol succinate 25 MG 24 hr tablet Commonly known as: TOPROL-XL TAKE 1/2 TABLET(12.5 MG) BY MOUTH DAILY What changed: See the new instructions.   multivitamin with minerals Tabs tablet Take 1 tablet by mouth daily.   omeprazole 20 MG capsule Commonly known as: PRILOSEC TAKE 1 CAPSULE(20 MG) BY MOUTH DAILY What changed: See the new instructions.   ondansetron 8 MG tablet Commonly known as: ZOFRAN One pill every 8 hours as needed for nausea/vomitting.   oxyCODONE 5 MG immediate release  tablet Commonly known as: Oxy IR/ROXICODONE Take 0.5 tablets (2.5 mg total) by mouth every 6 (six) hours as needed for severe pain. What changed:   how much to take  when to take this   polyethylene glycol powder 17 GM/SCOOP powder Commonly known as: GLYCOLAX/MIRALAX Take 0.5 Containers by mouth daily as needed for mild constipation or moderate constipation.   pregabalin 75 MG capsule Commonly known as: Lyrica Take 1 capsule (75 mg total) by mouth 3 (three) times daily.   prochlorperazine 10 MG tablet Commonly known as: COMPAZINE Take 1 tablet (10 mg total) by mouth every 6 (six) hours as needed for nausea or vomiting.   SUMAtriptan 100 MG tablet Commonly known as: Imitrex Take 1 tablet (100 mg total) by mouth every 2 (two) hours as needed for migraine. May repeat in 2 hours if headache persists or recurs.   traZODone 50 MG tablet Commonly known as: DESYREL Take 0.5-1 tablets (25-50 mg total) by mouth at bedtime as needed for sleep.   valACYclovir 1000 MG tablet Commonly known as: VALTREX TAKE 1 TABLET(1000 MG) BY MOUTH TWICE DAILY AS NEEDED FOR OUTBREAK What changed: See the new instructions.       Follow-up Information    Cristle Jared, Forest Gleason, MD. Go on 05/04/2021.   Specialties: General Surgery, Radiology Why: 9:15am appointment  Contact information: [place] [address] [phone]        Steele Sizer, MD. Go on 06/25/2021.   Specialty: Family Medicine Why: 3:20pm appointment Contact information: [address] [place] [address] [phone]        Kate Sable, MD. Go on 05/04/2021.   Specialties: Cardiology, Radiology Why: 2:30pm appointment Contact information: [place] [address] [phone]               Signed: Robert Bellow 05/06/2021, 1:19 PM

## 2021-05-13 ENCOUNTER — Inpatient Hospital Stay: Payer: Medicaid Other | Attending: Hospice and Palliative Medicine | Admitting: Hospice and Palliative Medicine

## 2021-05-13 DIAGNOSIS — Z515 Encounter for palliative care: Secondary | ICD-10-CM

## 2021-05-13 NOTE — Progress Notes (Signed)
Voicemail left for patient.  Will reschedule virtual visit.

## 2021-05-18 ENCOUNTER — Inpatient Hospital Stay (HOSPITAL_BASED_OUTPATIENT_CLINIC_OR_DEPARTMENT_OTHER): Payer: Medicaid Other | Admitting: Hospice and Palliative Medicine

## 2021-05-18 DIAGNOSIS — C482 Malignant neoplasm of peritoneum, unspecified: Secondary | ICD-10-CM

## 2021-05-18 DIAGNOSIS — U071 COVID-19: Secondary | ICD-10-CM

## 2021-05-18 MED ORDER — MOLNUPIRAVIR EUA 200MG CAPSULE: 4.0000 | ORAL_CAPSULE | Freq: Two times a day (BID) | ORAL | 0 refills | Status: AC

## 2021-05-18 NOTE — Progress Notes (Signed)
Virtual Visit via Telephone Note  I connected with [name] on 05/18/21 at 10:30 AM EDT by telephone and verified that I am speaking with the correct person using two identifiers.  Location: Patient: Home Provider: Clinic   I discussed the limitations, risks, security and privacy concerns of performing an evaluation and management service by telephone and the availability of in person appointments. I also discussed with the patient that there may be a patient responsible charge related to this service. The patient expressed understanding and agreed to proceed.   History of Present Illness: Ms. 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  Patient is also status post neoadjuvant carbo/Taxol on maintenance Avastin/Lynparza. 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 Patient has also had chronic abdominal pain. She was referred to palliative care to help address goals and manage ongoing symptoms.   Observations/Objective: Patient with an acute add-on to my clinic schedule today for evaluation management of COVID-19.  Patient discharged from the hospital recently 04/28/2021 after ventral hernia repair.  Patient reports that she was doing reasonably well until yesterday when she developed fever (T-max of 102), chills, body aches, dry cough, nasal congestion, and headaches.  She says multiple family members have since tested positive with COVID-19.  She took a rapid test yesterday and was also positive for COVID-19.  Patient denies shortness of breath.  Patient is vaccinated x 2. No booster.   Patient is within a 5-day window for  potential oral treatment.  She is at high risk of progression given cancer and active treatment. Unfortunately, she has multiple medication interactions for Paxlovid. Will start on Molnupiravir. Discussed with her community pharmacist who has in [place]. Patient is s/p total hysterectomy.   Assessment and Plan: COVID-19 - Start molnupiravir 800mg  (four 200mg  capsules) BID x 5 days, #40. Symptomatic care. Discussed parameters for seeking emergency care including if symptoms worsen or if she develops shortness of breath.   Case and plan discussed with Dr. Rogue Bussing  Follow Up Instructions: Patient scheduled to see Dr. Janese Banks on 5/27 - will need to reschedule given COVID +   I discussed the assessment and treatment plan with the patient. The patient was provided an opportunity to ask questions and all were answered. The patient agreed with the plan and demonstrated an understanding of the instructions.   The patient was advised to call back or seek an in-person evaluation if the symptoms worsen or if the condition fails to improve as anticipated.  I provided 20 minutes of non-face-to-face time during this encounter.   Irean Hong, NP

## 2021-05-20 ENCOUNTER — Other Ambulatory Visit: Payer: Self-pay | Admitting: Family Medicine

## 2021-05-20 DIAGNOSIS — E8941 Symptomatic postprocedural ovarian failure: Secondary | ICD-10-CM

## 2021-05-21 ENCOUNTER — Inpatient Hospital Stay: Payer: Medicaid Other | Admitting: Oncology

## 2021-05-21 ENCOUNTER — Inpatient Hospital Stay: Payer: Medicaid Other

## 2021-05-30 IMAGING — DX DG CHEST 2V
2 series · 2 of 2 positions shown · non-contrast
Comparison: 06/21/2019

CLINICAL DATA: Shortness of breath, cough for few weeks, smoker,
hypertension, GERD

EXAM:
CHEST - 2 VIEW

[chest pa]
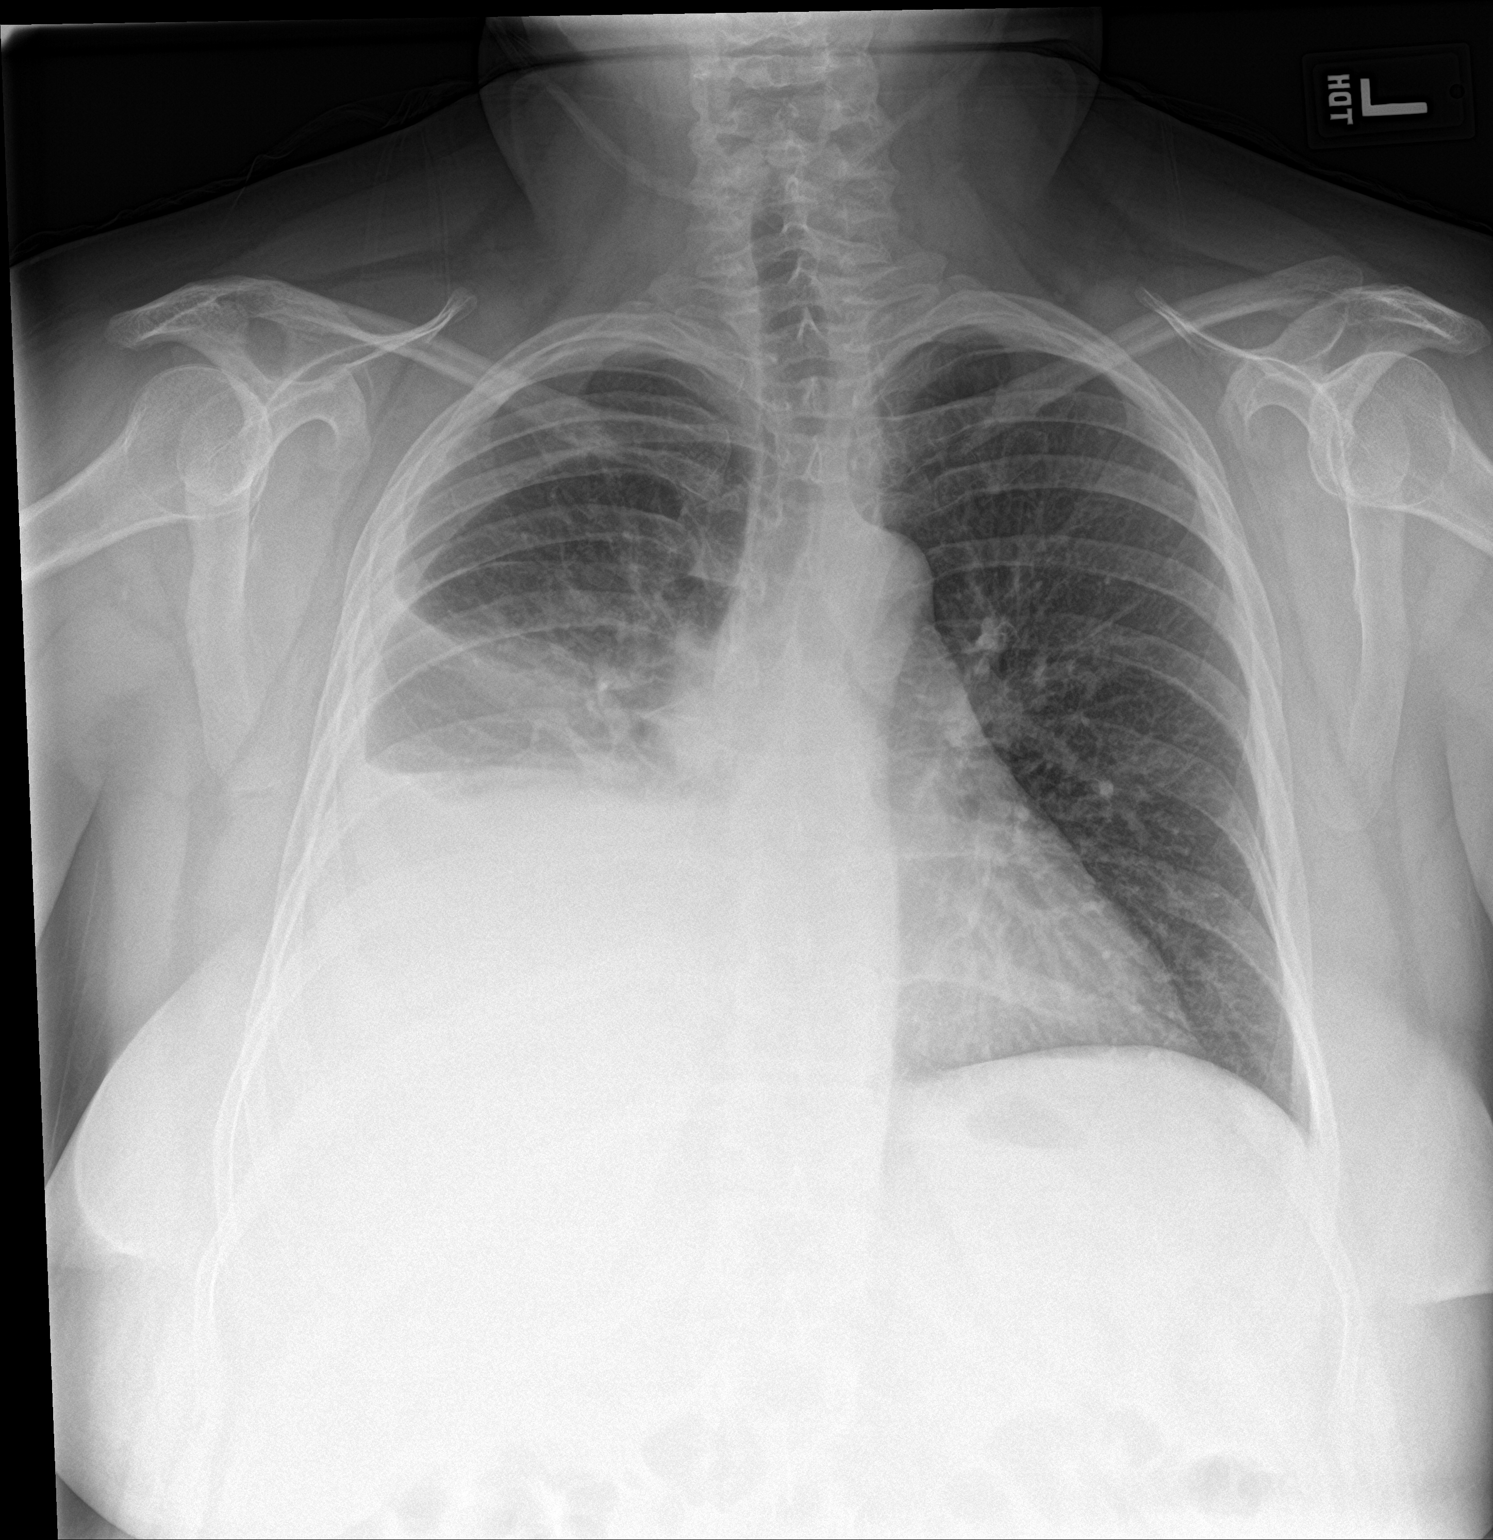

[chest lat]
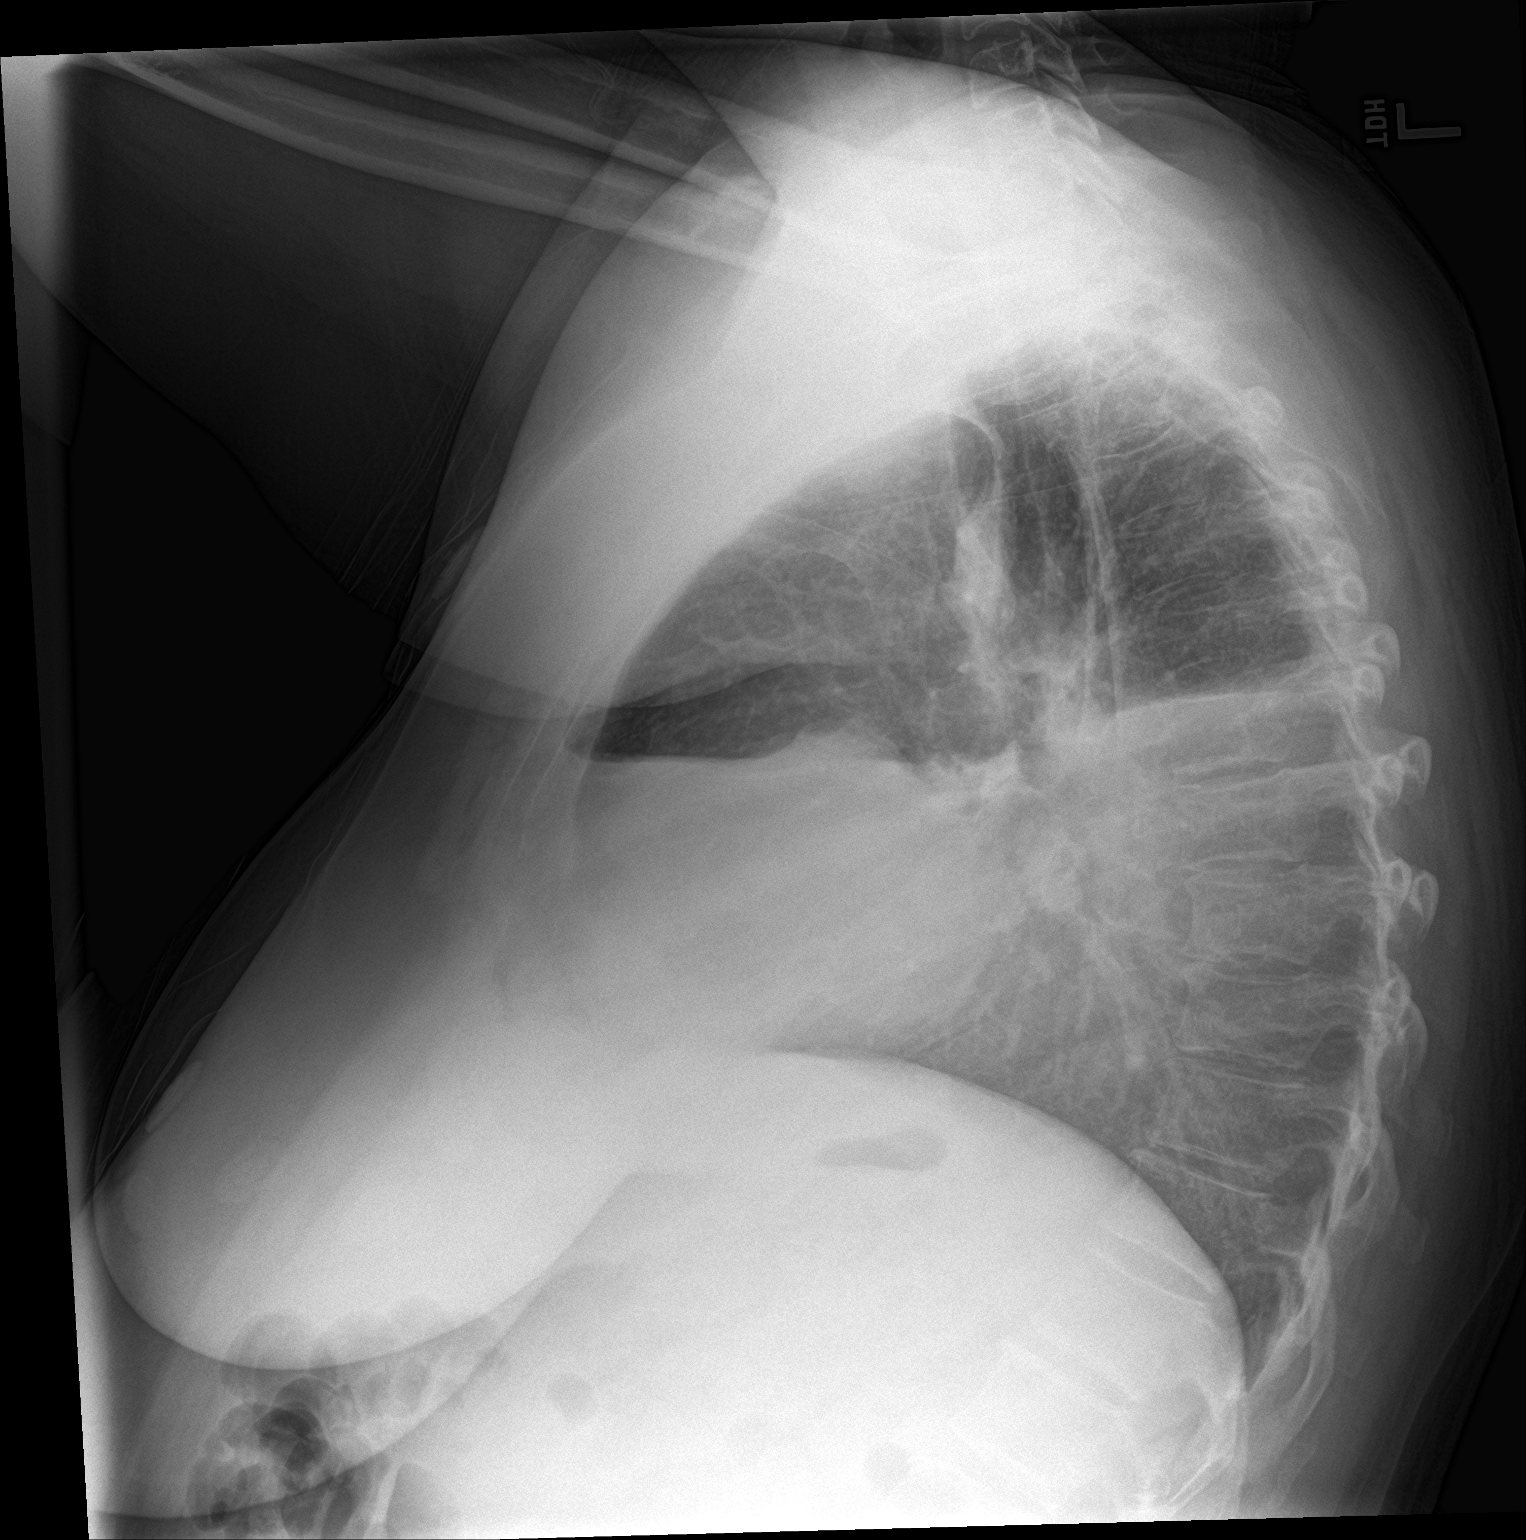

[2 of 2 positions shown; findings below may reference images not displayed]

FINDINGS: Normal heart size mediastinal contours.

Moderate to large RIGHT pleural effusion and basilar atelectasis,
new.

LEFT lung clear.

No pneumothorax or mass identified.

No acute osseous findings.
IMPRESSION: New moderate to large RIGHT pleural effusion with significant
atelectasis of the lower RIGHT lung.

## 2021-05-31 IMAGING — CR DG CHEST 1V
1 series · 1 of 1 positions shown · non-contrast
Comparison: Earlier same day

CLINICAL DATA: Post right-sided thoracentesis

EXAM:
CHEST  1 VIEW

[chest pa]
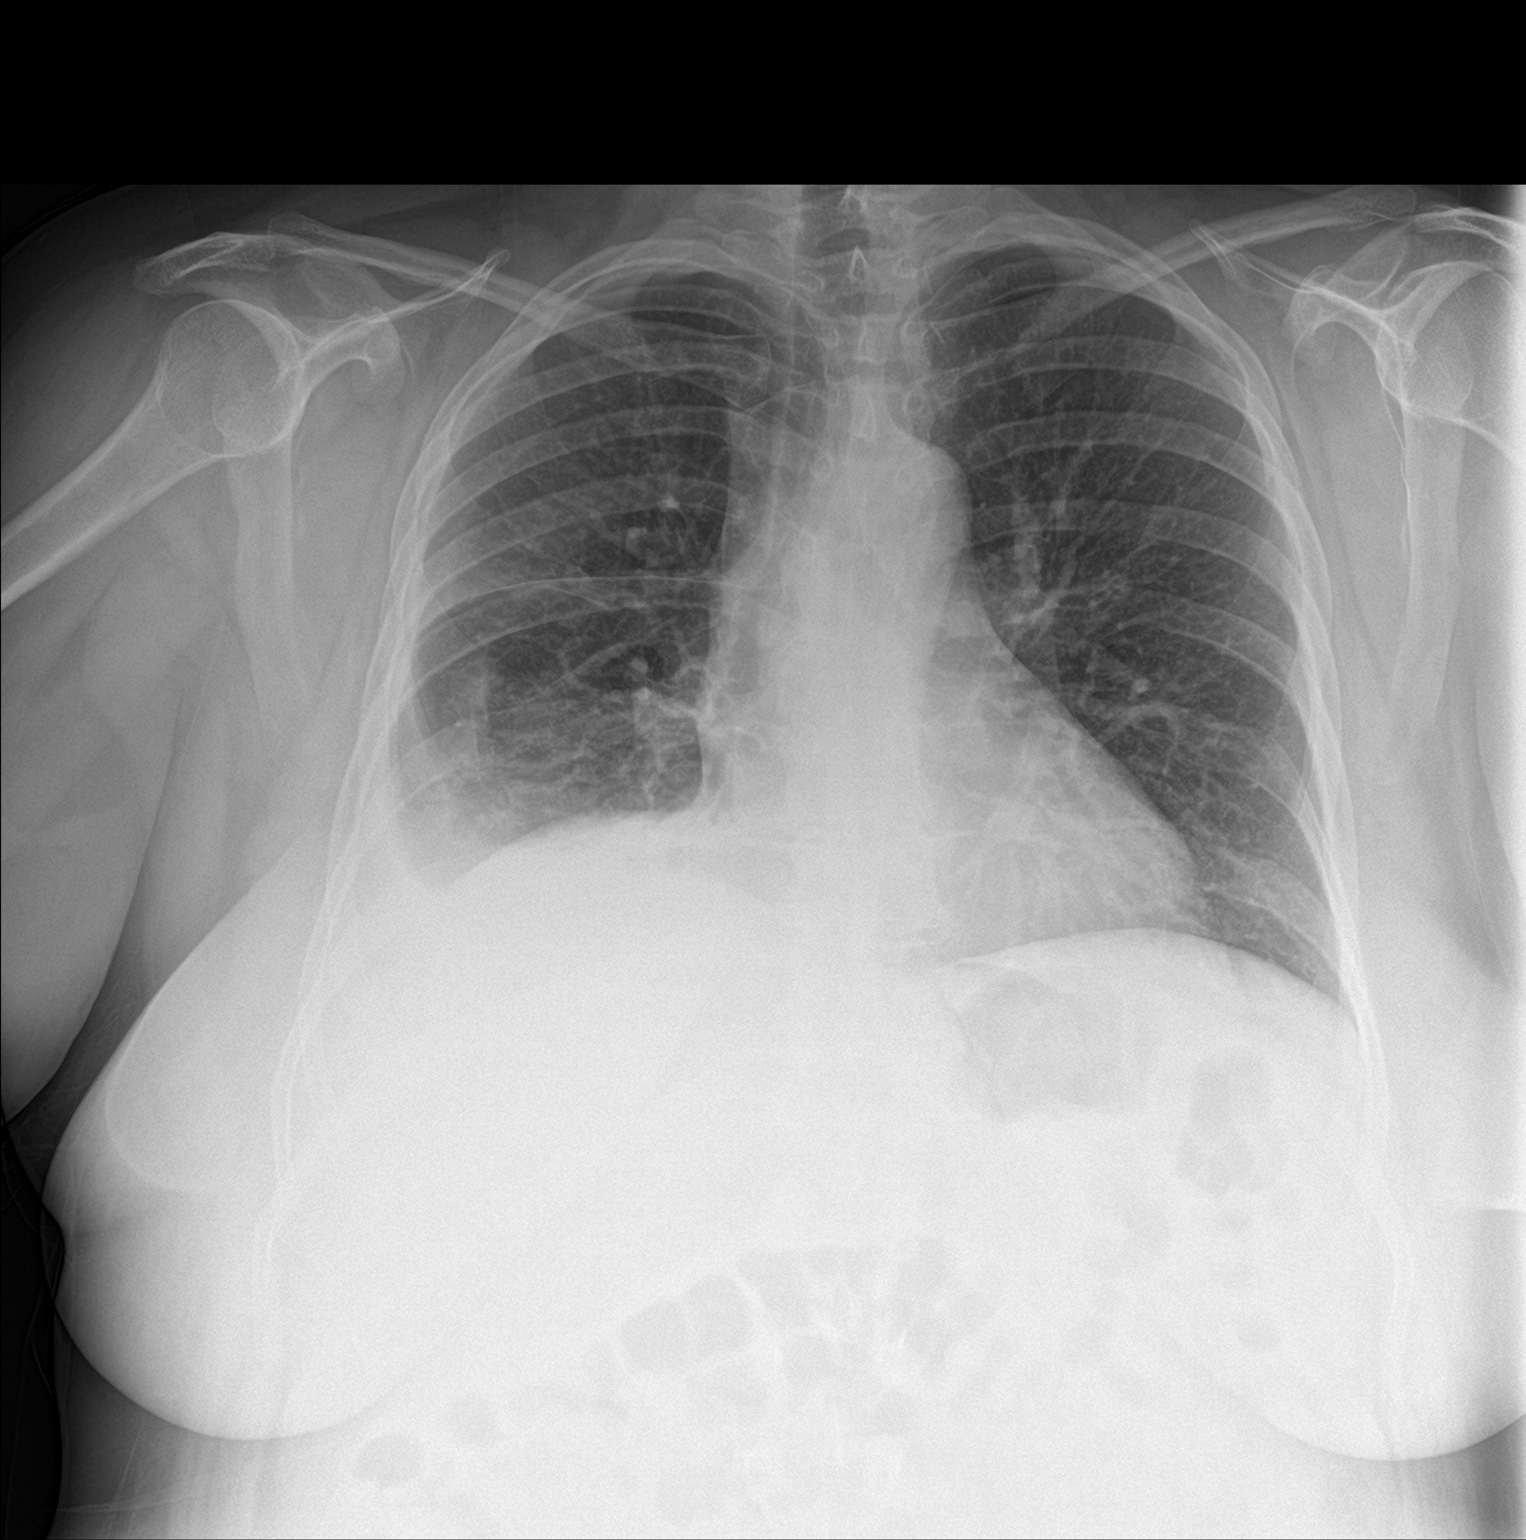

[1 of 1 positions shown; findings below may reference images not displayed]

FINDINGS: Grossly unchanged cardiac silhouette and mediastinal contours.
Interval reduction in persistent small right-sided effusion post
thoracentesis with improved aeration the right lung base. No
pneumothorax. Minimal right mid and lower lung
heterogeneous/consolidative opacities favored to represent
atelectasis. The left hemithorax remains well aerated. No pleural
effusion. No acute osseous abnormalities.
IMPRESSION: Interval reduction in persistent small right-sided pleural effusion
post thoracentesis. No pneumothorax.

## 2021-05-31 IMAGING — US IR THORACENTESIS ASP PLEURAL SPACE W/IMG GUIDE
1 series · 5 of 5 positions shown · non-contrast
Comparison: none

INDICATION: Shortness of breath. Right-sided pleural effusion. Request for
diagnostic and therapeutic thoracentesis.

[Series 1: (id) · 5 of 5 slices shown]
[im 1/5]
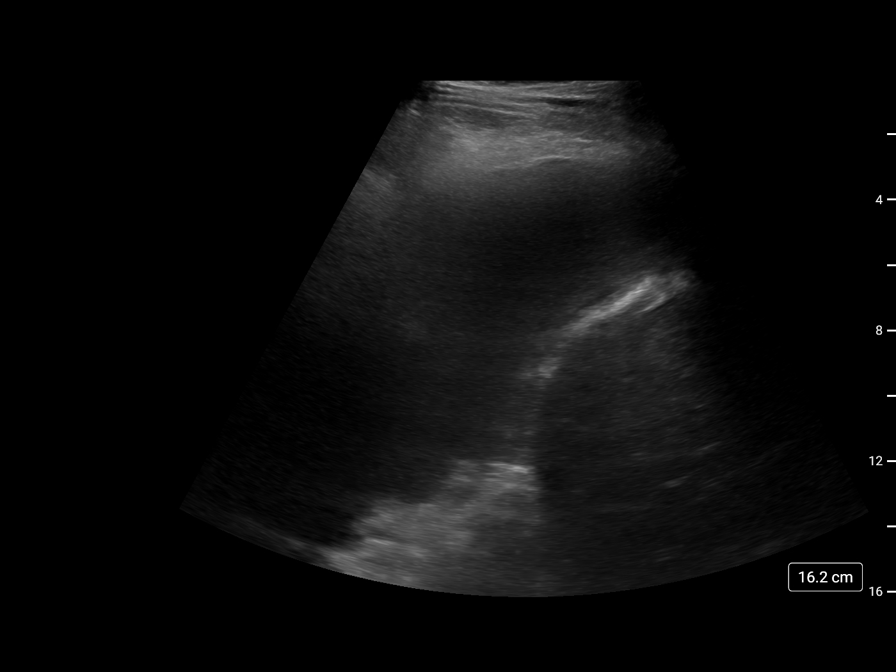
[im 2/5]
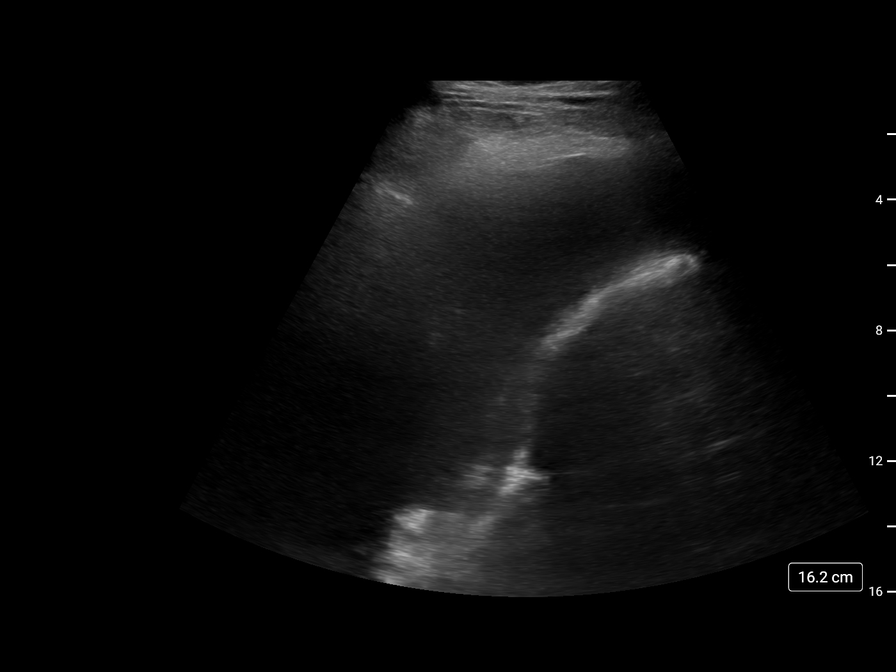
[im 3/5]
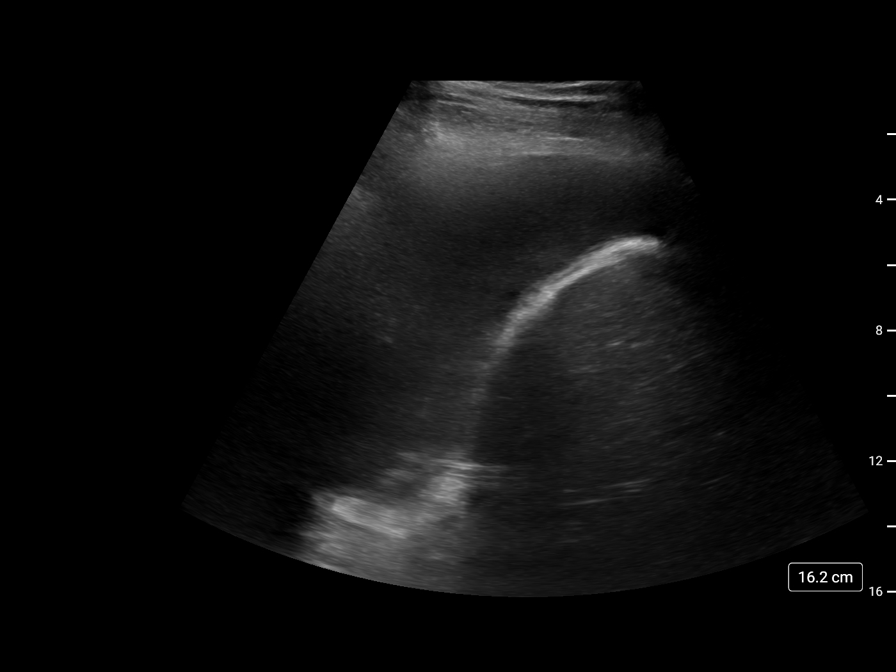
[im 4/5]
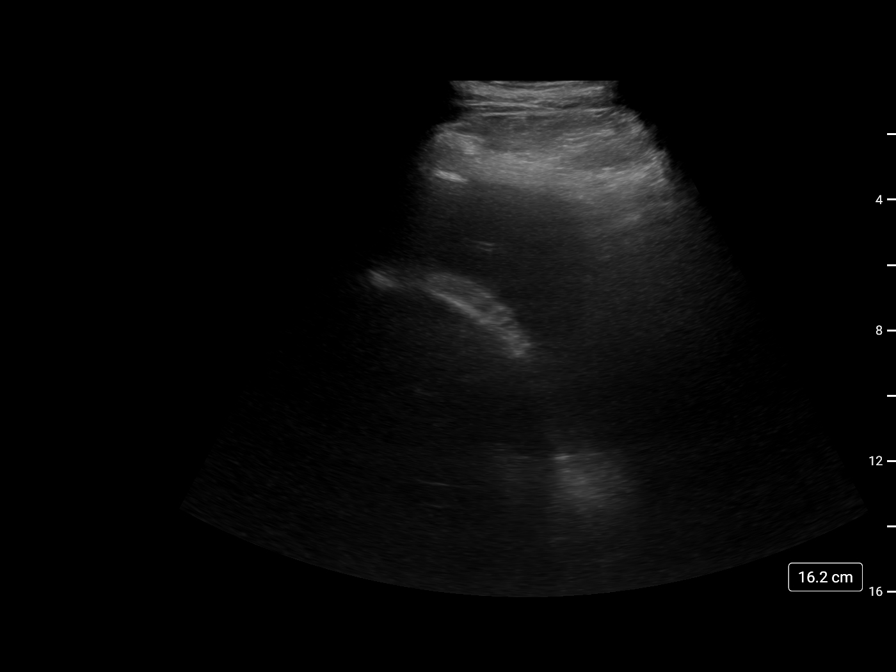
[im 5/5]
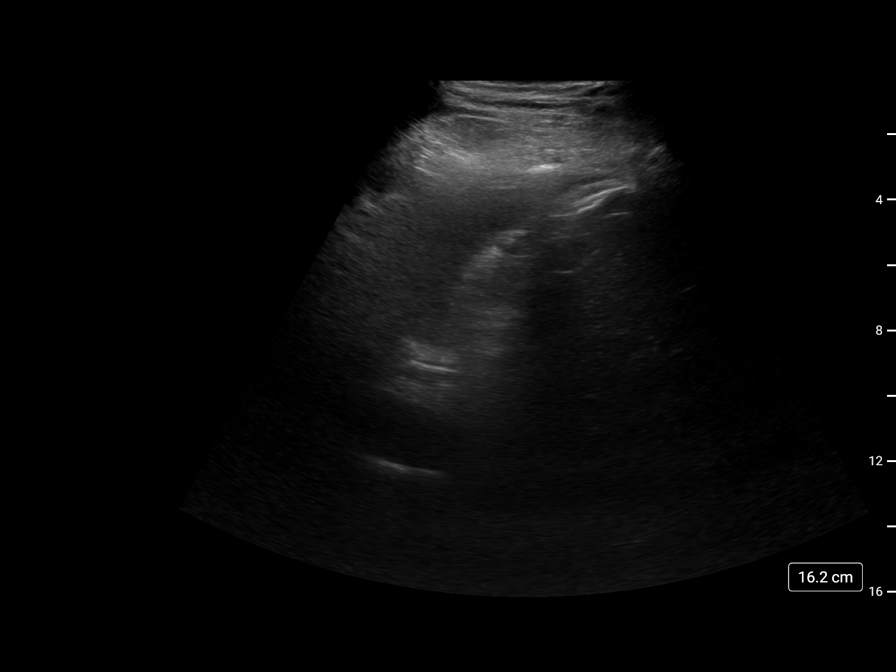

[5 of 5 positions shown; findings below may reference images not displayed]

EXAM:
ULTRASOUND GUIDED RIGHT THORACENTESIS

MEDICATIONS:
None.

COMPLICATIONS:
None immediate.

PROCEDURE:
An ultrasound guided thoracentesis was thoroughly discussed with the
patient and questions answered. The benefits, risks, alternatives
and complications were also discussed. The patient understands and
wishes to proceed with the procedure. Written consent was obtained.

Ultrasound was performed to localize and mark an adequate pocket of
fluid in the right chest. The area was then prepped and draped in
the normal sterile fashion. 1% Lidocaine was used for local
anesthesia. Under ultrasound guidance a 6 Fr Safe-T-Centesis
catheter was introduced. Thoracentesis was performed. The catheter
was removed and a dressing applied.
FINDINGS: A total of approximately 1.2 L of hazy, amber colored fluid was
removed. Samples were sent to the laboratory as requested by the
clinical team.
IMPRESSION: Successful ultrasound guided right thoracentesis yielding 1.2 L of
pleural fluid.

## 2021-06-02 ENCOUNTER — Inpatient Hospital Stay: Payer: Medicaid Other

## 2021-06-02 ENCOUNTER — Inpatient Hospital Stay: Payer: Medicaid Other | Admitting: Internal Medicine

## 2021-06-03 ENCOUNTER — Other Ambulatory Visit: Payer: Self-pay | Admitting: Family Medicine

## 2021-06-03 DIAGNOSIS — R000 Tachycardia, unspecified: Secondary | ICD-10-CM

## 2021-06-03 DIAGNOSIS — G43109 Migraine with aura, not intractable, without status migrainosus: Secondary | ICD-10-CM

## 2021-06-04 ENCOUNTER — Other Ambulatory Visit: Payer: Self-pay | Admitting: *Deleted

## 2021-06-04 ENCOUNTER — Other Ambulatory Visit: Payer: Self-pay | Admitting: Hospice and Palliative Medicine

## 2021-06-04 MED ORDER — OXYCODONE HCL 5 MG PO TABS: 2.5000 mg | ORAL_TABLET | Freq: Four times a day (QID) | ORAL | 0 refills | Status: DC | PRN

## 2021-06-04 NOTE — Progress Notes (Signed)
Patient requested refill of oxycodone. PDMP reviewed. Rx sent #60

## 2021-06-14 ENCOUNTER — Inpatient Hospital Stay: Payer: Medicaid Other

## 2021-06-14 ENCOUNTER — Other Ambulatory Visit: Payer: Self-pay | Admitting: Pharmacist

## 2021-06-14 ENCOUNTER — Inpatient Hospital Stay: Payer: Medicaid Other | Attending: Oncology | Admitting: Internal Medicine

## 2021-06-14 ENCOUNTER — Encounter: Payer: Self-pay | Admitting: Internal Medicine

## 2021-06-14 ENCOUNTER — Other Ambulatory Visit (HOSPITAL_COMMUNITY): Payer: Self-pay

## 2021-06-14 DIAGNOSIS — C482 Malignant neoplasm of peritoneum, unspecified: Secondary | ICD-10-CM

## 2021-06-14 DIAGNOSIS — M791 Myalgia, unspecified site: Secondary | ICD-10-CM | POA: Diagnosis not present

## 2021-06-14 DIAGNOSIS — F17210 Nicotine dependence, cigarettes, uncomplicated: Secondary | ICD-10-CM | POA: Diagnosis not present

## 2021-06-14 DIAGNOSIS — Z85118 Personal history of other malignant neoplasm of bronchus and lung: Secondary | ICD-10-CM | POA: Diagnosis not present

## 2021-06-14 DIAGNOSIS — R5383 Other fatigue: Secondary | ICD-10-CM | POA: Diagnosis not present

## 2021-06-14 DIAGNOSIS — Z801 Family history of malignant neoplasm of trachea, bronchus and lung: Secondary | ICD-10-CM | POA: Insufficient documentation

## 2021-06-14 DIAGNOSIS — Z803 Family history of malignant neoplasm of breast: Secondary | ICD-10-CM | POA: Insufficient documentation

## 2021-06-14 DIAGNOSIS — K5900 Constipation, unspecified: Secondary | ICD-10-CM | POA: Diagnosis not present

## 2021-06-14 DIAGNOSIS — M549 Dorsalgia, unspecified: Secondary | ICD-10-CM | POA: Insufficient documentation

## 2021-06-14 DIAGNOSIS — D6481 Anemia due to antineoplastic chemotherapy: Secondary | ICD-10-CM | POA: Diagnosis not present

## 2021-06-14 DIAGNOSIS — Z818 Family history of other mental and behavioral disorders: Secondary | ICD-10-CM | POA: Insufficient documentation

## 2021-06-14 DIAGNOSIS — Z800 Family history of malignant neoplasm of digestive organs: Secondary | ICD-10-CM | POA: Insufficient documentation

## 2021-06-14 DIAGNOSIS — Z809 Family history of malignant neoplasm, unspecified: Secondary | ICD-10-CM | POA: Insufficient documentation

## 2021-06-14 DIAGNOSIS — Z5112 Encounter for antineoplastic immunotherapy: Secondary | ICD-10-CM | POA: Diagnosis not present

## 2021-06-14 DIAGNOSIS — Z806 Family history of leukemia: Secondary | ICD-10-CM | POA: Insufficient documentation

## 2021-06-14 DIAGNOSIS — Z79899 Other long term (current) drug therapy: Secondary | ICD-10-CM | POA: Insufficient documentation

## 2021-06-14 DIAGNOSIS — Z823 Family history of stroke: Secondary | ICD-10-CM | POA: Insufficient documentation

## 2021-06-14 DIAGNOSIS — M2550 Pain in unspecified joint: Secondary | ICD-10-CM | POA: Insufficient documentation

## 2021-06-14 LAB — CBC WITH DIFFERENTIAL/PLATELET
Abs Immature Granulocytes: 0.06 10*3/uL (ref 0.00–0.07)
Basophils Absolute: 0.1 10*3/uL (ref 0.0–0.1)
Basophils Relative: 1 %
Eosinophils Absolute: 0.1 10*3/uL (ref 0.0–0.5)
Eosinophils Relative: 2 %
HCT: 41 % (ref 36.0–46.0)
Hemoglobin: 13 g/dL (ref 12.0–15.0)
Immature Granulocytes: 1 %
Lymphocytes Relative: 26 %
Lymphs Abs: 1.7 10*3/uL (ref 0.7–4.0)
MCH: 30.2 pg (ref 26.0–34.0)
MCHC: 31.7 g/dL (ref 30.0–36.0)
MCV: 95.1 fL (ref 80.0–100.0)
Monocytes Absolute: 0.5 10*3/uL (ref 0.1–1.0)
Monocytes Relative: 7 %
Neutro Abs: 4.1 10*3/uL (ref 1.7–7.7)
Neutrophils Relative %: 63 %
Platelets: 157 10*3/uL (ref 150–400)
RBC: 4.31 MIL/uL (ref 3.87–5.11)
RDW: 16.8 % — ABNORMAL HIGH (ref 11.5–15.5)
WBC: 6.5 10*3/uL (ref 4.0–10.5)
nRBC: 0 % (ref 0.0–0.2)

## 2021-06-14 LAB — COMPREHENSIVE METABOLIC PANEL
ALT: 17 U/L (ref 0–44)
AST: 16 U/L (ref 15–41)
Albumin: 3.6 g/dL (ref 3.5–5.0)
Alkaline Phosphatase: 66 U/L (ref 38–126)
Anion gap: 9 (ref 5–15)
BUN: 13 mg/dL (ref 6–20)
CO2: 27 mmol/L (ref 22–32)
Calcium: 9 mg/dL (ref 8.9–10.3)
Chloride: 99 mmol/L (ref 98–111)
Creatinine, Ser: 0.6 mg/dL (ref 0.44–1.00)
GFR, Estimated: >60 mL/min (ref >60)
Glucose, Bld: 106 mg/dL — ABNORMAL HIGH (ref 70–99)
Potassium: 4 mmol/L (ref 3.5–5.1)
Sodium: 135 mmol/L (ref 135–145)
Total Bilirubin: 0.8 mg/dL (ref 0.3–1.2)
Total Protein: 7.1 g/dL (ref 6.5–8.1)

## 2021-06-14 LAB — SAMPLE TO BLOOD BANK

## 2021-06-14 MED ORDER — HEPARIN SOD (PORK) LOCK FLUSH 100 UNIT/ML IV SOLN
INTRAVENOUS | Status: AC
  Filled 2021-06-14: qty 5

## 2021-06-14 MED ORDER — SODIUM CHLORIDE 0.9% FLUSH
10.0000 mL | Freq: Once | INTRAVENOUS | Status: AC
  Administered 2021-06-14: 10 mL via INTRAVENOUS
  Filled 2021-06-14: qty 10

## 2021-06-14 MED ORDER — OLAPARIB 100 MG PO TABS
200.0000 mg | ORAL_TABLET | Freq: Two times a day (BID) | ORAL | 3 refills | Status: DC
  Filled 2021-06-14: qty 60, 15d supply, fill #0

## 2021-06-14 MED ORDER — OLAPARIB 100 MG PO TABS
200.0000 mg | ORAL_TABLET | Freq: Two times a day (BID) | ORAL | 3 refills | Status: DC
  Filled 2021-06-14 – 2021-06-16 (×3): qty 120, 30d supply, fill #0
  Filled 2021-07-23: qty 120, 30d supply, fill #1
  Filled 2021-09-06: qty 120, 30d supply, fill #2
  Filled 2021-10-07: qty 120, 30d supply, fill #3

## 2021-06-14 MED ORDER — HEPARIN SOD (PORK) LOCK FLUSH 100 UNIT/ML IV SOLN
500.0000 [IU] | Freq: Once | INTRAVENOUS | Status: AC
  Administered 2021-06-14: 500 [IU] via INTRAVENOUS
  Filled 2021-06-14: qty 5

## 2021-06-14 NOTE — Assessment & Plan Note (Addendum)
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 STABLE; tumor markers- improving.  Currently off bevacizumab [last infusion 2/11-secondary to ventral hernia surgery].   #Recommend restarting Lynparza 200 mg twice a day [severe anemia while on 250 mg twice a day].   #  Severe recurrent anemia secondary to Lynparza 250 mg BID [Hb 5.8 on 2/23- needing 2 units PRBC;  Continue to hold Lynparza; will plan to start Lynparza 200 mg twice a day.  New prescription sent.  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  Stable  #IV access/Mediport-stable/port flush  # DISPOSITION:  # in 1 week- avastin; check UA; cbc/bmp # in 4 weeks MD;  Avastin; labs-  UA; cbc/cmp; ca-125- Dr.B

## 2021-06-15 ENCOUNTER — Other Ambulatory Visit (HOSPITAL_COMMUNITY): Payer: Self-pay

## 2021-06-16 ENCOUNTER — Other Ambulatory Visit (HOSPITAL_COMMUNITY): Payer: Self-pay

## 2021-06-16 ENCOUNTER — Encounter: Payer: Self-pay | Admitting: Internal Medicine

## 2021-06-20 ENCOUNTER — Encounter: Payer: Self-pay | Admitting: Internal Medicine
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
## 2021-06-22 ENCOUNTER — Inpatient Hospital Stay: Payer: Medicaid Other

## 2021-06-22 ENCOUNTER — Other Ambulatory Visit: Payer: Self-pay | Admitting: Internal Medicine

## 2021-06-22 VITALS — BP 113/66 | HR 62 | Temp 96.4°F | Resp 16 | Wt 271.4 lb

## 2021-06-22 DIAGNOSIS — Z7189 Other specified counseling: Secondary | ICD-10-CM

## 2021-06-22 DIAGNOSIS — Z5112 Encounter for antineoplastic immunotherapy: Secondary | ICD-10-CM | POA: Diagnosis not present

## 2021-06-22 DIAGNOSIS — C482 Malignant neoplasm of peritoneum, unspecified: Secondary | ICD-10-CM

## 2021-06-22 LAB — COMPREHENSIVE METABOLIC PANEL
ALT: 15 U/L (ref 0–44)
AST: 15 U/L (ref 15–41)
Albumin: 3.4 g/dL — ABNORMAL LOW (ref 3.5–5.0)
Alkaline Phosphatase: 68 U/L (ref 38–126)
Anion gap: 6 (ref 5–15)
BUN: 16 mg/dL (ref 6–20)
CO2: 27 mmol/L (ref 22–32)
Calcium: 9 mg/dL (ref 8.9–10.3)
Chloride: 103 mmol/L (ref 98–111)
Creatinine, Ser: 0.81 mg/dL (ref 0.44–1.00)
GFR, Estimated: >60 mL/min (ref >60)
Glucose, Bld: 122 mg/dL — ABNORMAL HIGH (ref 70–99)
Potassium: 3.8 mmol/L (ref 3.5–5.1)
Sodium: 136 mmol/L (ref 135–145)
Total Bilirubin: 0.7 mg/dL (ref 0.3–1.2)
Total Protein: 6.8 g/dL (ref 6.5–8.1)

## 2021-06-22 LAB — CBC WITH DIFFERENTIAL/PLATELET
Abs Immature Granulocytes: 0.05 10*3/uL (ref 0.00–0.07)
Basophils Absolute: 0 10*3/uL (ref 0.0–0.1)
Basophils Relative: 1 %
Eosinophils Absolute: 0.1 10*3/uL (ref 0.0–0.5)
Eosinophils Relative: 1 %
HCT: 38.8 % (ref 36.0–46.0)
Hemoglobin: 12.4 g/dL (ref 12.0–15.0)
Immature Granulocytes: 1 %
Lymphocytes Relative: 25 %
Lymphs Abs: 1.7 10*3/uL (ref 0.7–4.0)
MCH: 30.4 pg (ref 26.0–34.0)
MCHC: 32 g/dL (ref 30.0–36.0)
MCV: 95.1 fL (ref 80.0–100.0)
Monocytes Absolute: 0.5 10*3/uL (ref 0.1–1.0)
Monocytes Relative: 7 %
Neutro Abs: 4.6 10*3/uL (ref 1.7–7.7)
Neutrophils Relative %: 65 %
Platelets: 150 10*3/uL (ref 150–400)
RBC: 4.08 MIL/uL (ref 3.87–5.11)
RDW: 17.2 % — ABNORMAL HIGH (ref 11.5–15.5)
WBC: 7 10*3/uL (ref 4.0–10.5)
nRBC: 0 % (ref 0.0–0.2)

## 2021-06-22 LAB — URINALYSIS, COMPLETE (UACMP) WITH MICROSCOPIC
Bilirubin Urine: NEGATIVE
Glucose, UA: NEGATIVE mg/dL
Hgb urine dipstick: NEGATIVE
Ketones, ur: NEGATIVE mg/dL
Leukocytes,Ua: NEGATIVE
Nitrite: NEGATIVE
Protein, ur: NEGATIVE mg/dL
Specific Gravity, Urine: 1.027 (ref 1.005–1.030)
pH: 6 (ref 5.0–8.0)

## 2021-06-22 LAB — SAMPLE TO BLOOD BANK

## 2021-06-22 MED ORDER — SODIUM CHLORIDE 0.9% FLUSH
10.0000 mL | INTRAVENOUS | Status: DC | PRN
  Administered 2021-06-22: 10 mL
  Filled 2021-06-22: qty 10

## 2021-06-22 MED ORDER — SODIUM CHLORIDE 0.9 % IV SOLN
Freq: Once | INTRAVENOUS | Status: AC
  Filled 2021-06-22: qty 250

## 2021-06-22 MED ORDER — DEXAMETHASONE SODIUM PHOSPHATE 10 MG/ML IJ SOLN
4.0000 mg | Freq: Once | INTRAMUSCULAR | Status: AC
Start: 2021-06-22 — End: 2021-06-22
  Administered 2021-06-22: 4 mg via INTRAVENOUS
  Filled 2021-06-22: qty 1

## 2021-06-22 MED ORDER — SODIUM CHLORIDE 0.9 % IV SOLN: 4.0000 mg | Freq: Once | INTRAVENOUS | Status: DC

## 2021-06-22 MED ORDER — HEPARIN SOD (PORK) LOCK FLUSH 100 UNIT/ML IV SOLN
INTRAVENOUS | Status: AC
  Filled 2021-06-22: qty 5

## 2021-06-22 MED ORDER — SODIUM CHLORIDE 0.9% FLUSH
10.0000 mL | Freq: Once | INTRAVENOUS | Status: AC
Start: 2021-06-22 — End: 2021-06-22
  Administered 2021-06-22: 10 mL via INTRAVENOUS
  Filled 2021-06-22: qty 10

## 2021-06-22 MED ORDER — HEPARIN SOD (PORK) LOCK FLUSH 100 UNIT/ML IV SOLN
500.0000 [IU] | Freq: Once | INTRAVENOUS | Status: AC | PRN
Start: 2021-06-22 — End: 2021-06-22
  Administered 2021-06-22: 500 [IU]
  Filled 2021-06-22: qty 5

## 2021-06-22 MED ORDER — SODIUM CHLORIDE 0.9 % IV SOLN
15.0000 mg/kg | Freq: Once | INTRAVENOUS | Status: AC
  Administered 2021-06-22: 1700 mg via INTRAVENOUS
  Filled 2021-06-22: qty 64

## 2021-06-22 NOTE — Patient Instructions (Signed)
[facility] ONCOLOGY  Discharge Instructions: Thank you for choosing [facility] to provide your oncology and hematology care.  If you have a lab appointment with the [facility], please go directly to the [facility] and check in at the registration area.  Wear comfortable clothing and clothing appropriate for easy access to any Portacath or PICC line.   We strive to give you quality time with your provider. You may need to reschedule your appointment if you arrive late (15 or more minutes).  Arriving late affects you and other patients whose appointments are after yours.  Also, if you miss three or more appointments without notifying the office, you may be dismissed from the clinic at the provider's discretion.      For prescription refill requests, have your pharmacy contact our office and allow 72 hours for refills to be completed.    Today you received the following chemotherapy and/or immunotherapy agents - bevacizumab      To help prevent nausea and vomiting after your treatment, we encourage you to take your nausea medication as directed.  BELOW ARE SYMPTOMS THAT SHOULD BE REPORTED IMMEDIATELY: *FEVER GREATER THAN 100.4 F (38 C) OR HIGHER *CHILLS OR SWEATING *NAUSEA AND VOMITING THAT IS NOT CONTROLLED WITH YOUR NAUSEA MEDICATION *UNUSUAL SHORTNESS OF BREATH *UNUSUAL BRUISING OR BLEEDING *URINARY PROBLEMS (pain or burning when urinating, or frequent urination) *BOWEL PROBLEMS (unusual diarrhea, constipation, pain near the anus) TENDERNESS IN MOUTH AND THROAT WITH OR WITHOUT PRESENCE OF ULCERS (sore throat, sores in mouth, or a toothache) UNUSUAL RASH, SWELLING OR PAIN  UNUSUAL VAGINAL DISCHARGE OR ITCHING   Items with * indicate a potential emergency and should be followed up as soon as possible or go to the Emergency Department if any problems should occur.  Please show the CHEMOTHERAPY ALERT CARD or IMMUNOTHERAPY ALERT CARD at  check-in to the Emergency Department and triage nurse.  Should you have questions after your visit or need to cancel or reschedule your appointment, please contact [facility]  [phone] and follow the prompts.  Office hours are 8:00 a.m. to 4:30 p.m. Monday - Friday. Please note that voicemails left after 4:00 p.m. may not be returned until the following business day.  We are closed weekends and major holidays. You have access to a nurse at all times for urgent questions. Please call the main number to the clinic [phone] and follow the prompts.  For any non-urgent questions, you may also contact your provider using MyChart. We now offer e-Visits for anyone 88 and older to request care online for non-urgent symptoms. For details visit [email or web address].   Also download the MyChart app! Go to the app store, search "MyChart", open the app, select [facility], and log in with your MyChart username and password.  Due to Covid, a mask is required upon entering the hospital/clinic. If you do not have a mask, one will be given to you upon arrival. For doctor visits, patients may have 1 support person aged 67 or older with them. For treatment visits, patients cannot have anyone with them due to current Covid guidelines and our immunocompromised population.   Bevacizumab injection What is this medication? BEVACIZUMAB (be va SIZ yoo mab) is a monoclonal antibody. It is used to treatmany types of cancer. This medicine may be used for other purposes; ask your health care provider orpharmacist if you have questions. COMMON BRAND NAME(S): Avastin, MVASI, Zirabev What should I tell my care  team before I take this medication? They need to know if you have any of these conditions: diabetes heart disease high blood pressure history of coughing up blood prior anthracycline chemotherapy (e.g., doxorubicin, daunorubicin, epirubicin) recent or ongoing radiation  therapy recent or planning to have surgery stroke an unusual or allergic reaction to bevacizumab, hamster proteins, mouse proteins, other medicines, foods, dyes, or preservatives pregnant or trying to get pregnant breast-feeding How should I use this medication? This medicine is for infusion into a vein. It is given by a health careprofessional in a hospital or clinic setting. Talk to your pediatrician regarding the use of this medicine in children.Special care may be needed. Overdosage: If you think you have taken too much of this medicine contact apoison control center or emergency room at once. NOTE: This medicine is only for you. Do not share this medicine with others. What if I miss a dose? It is important not to miss your dose. Call your doctor or health careprofessional if you are unable to keep an appointment. What may interact with this medication? Interactions are not expected. This list may not describe all possible interactions. Give your health care provider a list of all the medicines, herbs, non-prescription drugs, or dietary supplements you use. Also tell them if you smoke, drink alcohol, or use illegaldrugs. Some items may interact with your medicine. What should I watch for while using this medication? Your condition will be monitored carefully while you are receiving this medicine. You will need important blood work and urine testing done while youare taking this medicine. This medicine may increase your risk to bruise or bleed. Call your doctor orhealth care professional if you notice any unusual bleeding. Before having surgery, talk to your health care provider to make sure it is ok. This drug can increase the risk of poor healing of your surgical site or wound. You will need to stop this drug for 28 days before surgery. After surgery, wait at least 28 days before restarting this drug. Make sure the surgical site or wound is healed enough before restarting this drug. Talk to  your health careprovider if questions. Do not become pregnant while taking this medicine or for 6 months after stopping it. Women should inform their doctor if they wish to become pregnant or think they might be pregnant. There is a potential for serious side effects to an unborn child. Talk to your health care professional or pharmacist for more information. Do not breast-feed an infant while taking this medicine andfor 6 months after the last dose. This medicine has caused ovarian failure in some women. This medicine may interfere with the ability to have a child. You should talk to your doctor orhealth care professional if you are concerned about your fertility. What side effects may I notice from receiving this medication? Side effects that you should report to your doctor or health care professionalas soon as possible: allergic reactions like skin rash, itching or hives, swelling of the face, lips, or tongue chest pain or chest tightness chills coughing up blood high fever seizures severe constipation signs and symptoms of bleeding such as bloody or black, tarry stools; red or dark-brown urine; spitting up blood or brown material that looks like coffee grounds; red spots on the skin; unusual bruising or bleeding from the eye, gums, or nose signs and symptoms of a blood clot such as breathing problems; chest pain; severe, sudden headache; pain, swelling, warmth in the leg signs and symptoms of a stroke like changes  in vision; confusion; trouble speaking or understanding; severe headaches; sudden numbness or weakness of the face, arm or leg; trouble walking; dizziness; loss of balance or coordination stomach pain sweating swelling of legs or ankles vomiting weight gain Side effects that usually do not require medical attention (report to yourdoctor or health care professional if they continue or are bothersome): back pain changes in taste decreased appetite dry  skin nausea tiredness This list may not describe all possible side effects. Call your doctor for medical advice about side effects. You may report side effects to FDA at1-800-FDA-1088. Where should I keep my medication? This drug is given in a hospital or clinic and will not be stored at home. NOTE: This sheet is a summary. It may not cover all possible information. If you have questions about this medicine, talk to your doctor, pharmacist, orhealth care provider.  2022 Elsevier/Gold Standard (2019-10-09 10:50:46)

## 2021-06-25 ENCOUNTER — Inpatient Hospital Stay: Payer: Medicaid Other | Admitting: Family Medicine

## 2021-07-01 ENCOUNTER — Other Ambulatory Visit: Payer: Self-pay | Admitting: Nurse Practitioner

## 2021-07-02 ENCOUNTER — Other Ambulatory Visit: Payer: Self-pay | Admitting: *Deleted

## 2021-07-02 ENCOUNTER — Other Ambulatory Visit: Payer: Self-pay | Admitting: Hospice and Palliative Medicine

## 2021-07-02 ENCOUNTER — Other Ambulatory Visit: Payer: Self-pay | Admitting: Family Medicine

## 2021-07-02 ENCOUNTER — Encounter: Payer: Self-pay | Admitting: Internal Medicine

## 2021-07-02 DIAGNOSIS — K219 Gastro-esophageal reflux disease without esophagitis: Secondary | ICD-10-CM

## 2021-07-02 MED ORDER — LORAZEPAM 0.5 MG PO TABS
0.5000 mg | ORAL_TABLET | Freq: Three times a day (TID) | ORAL | 0 refills | Status: DC | PRN
Start: 2021-07-02 — End: 2022-10-21

## 2021-07-02 MED ORDER — OXYCODONE HCL 5 MG PO TABS: 2.5000 mg | ORAL_TABLET | Freq: Four times a day (QID) | ORAL | 0 refills | Status: DC | PRN

## 2021-07-02 NOTE — Telephone Encounter (Signed)
Received incoming fax from pharmacy to request for RF on Lorazepam

## 2021-07-06 ENCOUNTER — Other Ambulatory Visit: Payer: Self-pay | Admitting: Family Medicine

## 2021-07-06 DIAGNOSIS — G620 Drug-induced polyneuropathy: Secondary | ICD-10-CM

## 2021-07-06 DIAGNOSIS — T451X5A Adverse effect of antineoplastic and immunosuppressive drugs, initial encounter: Secondary | ICD-10-CM

## 2021-07-06 MED ORDER — PREGABALIN 75 MG PO CAPS: 75.0000 mg | ORAL_CAPSULE | Freq: Three times a day (TID) | ORAL | 0 refills | Status: DC

## 2021-07-12 ENCOUNTER — Other Ambulatory Visit: Payer: Self-pay

## 2021-07-12 ENCOUNTER — Inpatient Hospital Stay: Payer: Medicaid Other | Attending: Internal Medicine

## 2021-07-12 ENCOUNTER — Inpatient Hospital Stay (HOSPITAL_BASED_OUTPATIENT_CLINIC_OR_DEPARTMENT_OTHER): Payer: Medicaid Other | Admitting: Internal Medicine

## 2021-07-12 ENCOUNTER — Inpatient Hospital Stay: Payer: Medicaid Other

## 2021-07-12 ENCOUNTER — Encounter: Payer: Self-pay | Admitting: Internal Medicine

## 2021-07-12 DIAGNOSIS — C482 Malignant neoplasm of peritoneum, unspecified: Secondary | ICD-10-CM

## 2021-07-12 DIAGNOSIS — Z7189 Other specified counseling: Secondary | ICD-10-CM

## 2021-07-12 DIAGNOSIS — M549 Dorsalgia, unspecified: Secondary | ICD-10-CM | POA: Diagnosis not present

## 2021-07-12 DIAGNOSIS — Z5112 Encounter for antineoplastic immunotherapy: Secondary | ICD-10-CM | POA: Diagnosis not present

## 2021-07-12 LAB — COMPREHENSIVE METABOLIC PANEL
ALT: 18 U/L (ref 0–44)
AST: 19 U/L (ref 15–41)
Albumin: 4.1 g/dL (ref 3.5–5.0)
Alkaline Phosphatase: 66 U/L (ref 38–126)
Anion gap: 8 (ref 5–15)
BUN: 15 mg/dL (ref 6–20)
CO2: 24 mmol/L (ref 22–32)
Calcium: 9.8 mg/dL (ref 8.9–10.3)
Chloride: 102 mmol/L (ref 98–111)
Creatinine, Ser: 0.82 mg/dL (ref 0.44–1.00)
GFR, Estimated: >60 mL/min (ref >60)
Glucose, Bld: 96 mg/dL (ref 70–99)
Potassium: 4 mmol/L (ref 3.5–5.1)
Sodium: 134 mmol/L — ABNORMAL LOW (ref 135–145)
Total Bilirubin: 0.8 mg/dL (ref 0.3–1.2)
Total Protein: 7.8 g/dL (ref 6.5–8.1)

## 2021-07-12 LAB — URINALYSIS, COMPLETE (UACMP) WITH MICROSCOPIC
Bilirubin Urine: NEGATIVE
Glucose, UA: NEGATIVE mg/dL
Hgb urine dipstick: NEGATIVE
Ketones, ur: NEGATIVE mg/dL
Nitrite: NEGATIVE
Protein, ur: 30 mg/dL — AB
Specific Gravity, Urine: 1.029 (ref 1.005–1.030)
pH: 5 (ref 5.0–8.0)

## 2021-07-12 LAB — CBC WITH DIFFERENTIAL/PLATELET
Abs Immature Granulocytes: 0.04 10*3/uL (ref 0.00–0.07)
Basophils Absolute: 0.1 10*3/uL (ref 0.0–0.1)
Basophils Relative: 1 %
Eosinophils Absolute: 0.1 10*3/uL (ref 0.0–0.5)
Eosinophils Relative: 1 %
HCT: 43 % (ref 36.0–46.0)
Hemoglobin: 14.1 g/dL (ref 12.0–15.0)
Immature Granulocytes: 1 %
Lymphocytes Relative: 27 %
Lymphs Abs: 2.1 10*3/uL (ref 0.7–4.0)
MCH: 31.2 pg (ref 26.0–34.0)
MCHC: 32.8 g/dL (ref 30.0–36.0)
MCV: 95.1 fL (ref 80.0–100.0)
Monocytes Absolute: 0.5 10*3/uL (ref 0.1–1.0)
Monocytes Relative: 7 %
Neutro Abs: 5 10*3/uL (ref 1.7–7.7)
Neutrophils Relative %: 63 %
Platelets: 185 10*3/uL (ref 150–400)
RBC: 4.52 MIL/uL (ref 3.87–5.11)
RDW: 20.8 % — ABNORMAL HIGH (ref 11.5–15.5)
WBC: 7.8 10*3/uL (ref 4.0–10.5)
nRBC: 0.3 % — ABNORMAL HIGH (ref 0.0–0.2)

## 2021-07-12 MED ORDER — SODIUM CHLORIDE 0.9 % IV SOLN
Freq: Once | INTRAVENOUS | Status: AC
  Filled 2021-07-12: qty 250

## 2021-07-12 MED ORDER — PROCHLORPERAZINE MALEATE 10 MG PO TABS: 10.0000 mg | ORAL_TABLET | Freq: Four times a day (QID) | ORAL | 1 refills | Status: DC | PRN

## 2021-07-12 MED ORDER — SODIUM CHLORIDE 0.9 % IV SOLN
15.0000 mg/kg | Freq: Once | INTRAVENOUS | Status: AC
  Administered 2021-07-12: 1700 mg via INTRAVENOUS
  Filled 2021-07-12: qty 68

## 2021-07-12 MED ORDER — DEXAMETHASONE SODIUM PHOSPHATE 10 MG/ML IJ SOLN
4.0000 mg | Freq: Once | INTRAMUSCULAR | Status: AC
  Administered 2021-07-12: 4 mg via INTRAVENOUS
  Filled 2021-07-12: qty 1

## 2021-07-12 MED ORDER — HEPARIN SOD (PORK) LOCK FLUSH 100 UNIT/ML IV SOLN
INTRAVENOUS | Status: AC
  Filled 2021-07-12: qty 5

## 2021-07-12 MED ORDER — SODIUM CHLORIDE 0.9 % IV SOLN: 4.0000 mg | Freq: Once | INTRAVENOUS | Status: DC

## 2021-07-12 MED ORDER — ONDANSETRON 8 MG PO TBDP
8.0000 mg | ORAL_TABLET | Freq: Three times a day (TID) | ORAL | 0 refills | Status: DC | PRN
Start: 2021-07-12 — End: 2021-11-02

## 2021-07-12 MED ORDER — HEPARIN SOD (PORK) LOCK FLUSH 100 UNIT/ML IV SOLN
500.0000 [IU] | Freq: Once | INTRAVENOUS | Status: AC | PRN
  Administered 2021-07-12: 500 [IU]
  Filled 2021-07-12: qty 5

## 2021-07-12 NOTE — Assessment & Plan Note (Addendum)
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 STABLE; tumor markers- improving.  Currently off bevacizumab [last infusion 02/05/2021-secondary to ventral hernia surgery]. On avastin + lynparza.   # Proceed with avastin today; CBC CMP are reviewed; adequate today; no contraindications to chemotherapy.Continue Lynparza 200 mg twice a day [severe anemia while on 250 mg twice a day].   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   #IV access/Mediport-stable/port flush  # DISPOSITION:  # avastin today # in 3  weeks MD;  Avastin; labs-  UA; cbc/cmp; ca-125- Dr.B
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
## 2021-07-12 NOTE — Patient Instructions (Signed)
[facility] ONCOLOGY  Discharge Instructions: Thank you for choosing [facility] to provide your oncology and hematology care.  If you have a lab appointment with the [facility], please go directly to the [facility] and check in at the registration area.  Wear comfortable clothing and clothing appropriate for easy access to any Portacath or PICC line.   We strive to give you quality time with your provider. You may need to reschedule your appointment if you arrive late (15 or more minutes).  Arriving late affects you and other patients whose appointments are after yours.  Also, if you miss three or more appointments without notifying the office, you may be dismissed from the clinic at the provider's discretion.      For prescription refill requests, have your pharmacy contact our office and allow 72 hours for refills to be completed.    Today you received the following chemotherapy and/or immunotherapy agents Avastin    To help prevent nausea and vomiting after your treatment, we encourage you to take your nausea medication as directed.  BELOW ARE SYMPTOMS THAT SHOULD BE REPORTED IMMEDIATELY: *FEVER GREATER THAN 100.4 F (38 C) OR HIGHER *CHILLS OR SWEATING *NAUSEA AND VOMITING THAT IS NOT CONTROLLED WITH YOUR NAUSEA MEDICATION *UNUSUAL SHORTNESS OF BREATH *UNUSUAL BRUISING OR BLEEDING *URINARY PROBLEMS (pain or burning when urinating, or frequent urination) *BOWEL PROBLEMS (unusual diarrhea, constipation, pain near the anus) TENDERNESS IN MOUTH AND THROAT WITH OR WITHOUT PRESENCE OF ULCERS (sore throat, sores in mouth, or a toothache) UNUSUAL RASH, SWELLING OR PAIN  UNUSUAL VAGINAL DISCHARGE OR ITCHING   Items with * indicate a potential emergency and should be followed up as soon as possible or go to the Emergency Department if any problems should occur.  Please show the CHEMOTHERAPY ALERT CARD or IMMUNOTHERAPY ALERT CARD at check-in to  the Emergency Department and triage nurse.  Should you have questions after your visit or need to cancel or reschedule your appointment, please contact [facility]  [phone] and follow the prompts.  Office hours are 8:00 a.m. to 4:30 p.m. Monday - Friday. Please note that voicemails left after 4:00 p.m. may not be returned until the following business day.  We are closed weekends and major holidays. You have access to a nurse at all times for urgent questions. Please call the main number to the clinic [phone] and follow the prompts.  For any non-urgent questions, you may also contact your provider using MyChart. We now offer e-Visits for anyone 47 and older to request care online for non-urgent symptoms. For details visit [email or web address].   Also download the MyChart app! Go to the app store, search "MyChart", open the app, select [facility], and log in with your MyChart username and password.  Due to Covid, a mask is required upon entering the hospital/clinic. If you do not have a mask, one will be given to you upon arrival. For doctor visits, patients may have 1 support person aged 30 or older with them. For treatment visits, patients cannot have anyone with them due to current Covid guidelines and our immunocompromised population.

## 2021-07-13 ENCOUNTER — Other Ambulatory Visit (HOSPITAL_COMMUNITY): Payer: Self-pay

## 2021-07-13 LAB — CA 125: Cancer Antigen (CA) 125: 5.6 U/mL (ref 0.0–38.1)

## 2021-07-15 ENCOUNTER — Inpatient Hospital Stay (HOSPITAL_BASED_OUTPATIENT_CLINIC_OR_DEPARTMENT_OTHER): Payer: Medicaid Other | Admitting: Hospice and Palliative Medicine

## 2021-07-15 DIAGNOSIS — C482 Malignant neoplasm of peritoneum, unspecified: Secondary | ICD-10-CM | POA: Diagnosis not present

## 2021-07-15 DIAGNOSIS — Z515 Encounter for palliative care: Secondary | ICD-10-CM

## 2021-07-15 NOTE — Progress Notes (Signed)
Virtual Visit via Telephone Note  I connected with [name] on 07/15/21 at 10:30 AM EDT by telephone and verified that I am speaking with the correct person using two identifiers.  Location: Patient: Home Provider: Clinic   I discussed the limitations, risks, security and privacy concerns of performing an evaluation and management service by telephone and the availability of in person appointments. I also discussed with the patient that there may be a patient responsible charge related to this service. The patient expressed understanding and agreed to proceed.   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  Patient is also status post neoadjuvant carbo/Taxol on maintenance Avastin/Lynparza.  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  Patient has also had chronic abdominal pain.  She was referred to palliative care to help address goals and manage ongoing symptoms.   Observations/Objective: Routine follow-up.  Patient reports that she is doing reasonably well and denies any significant changes or concerns today.  No severe symptomatic complaints today.  She continues to endorse occasional pain, for which she takes oxycodone as needed and finds it effective.  Moods are stable and patient is sleeping well.  Appetite is stable.  Patient denies changes to performance status.  Assessment and Plan: Stage IV serous adenocarcinoma -on carbo/paclitaxel/Mvasi.  Followed by Dr. Rogue Bussing   Neoplasm related pain-continue oxycodone  Follow Up Instructions: Follow-up MyChart visit 2 months   I discussed the  assessment and treatment plan with the patient. The patient was provided an opportunity to ask questions and all were answered. The patient agreed with the plan and demonstrated an understanding of the instructions.   The patient was advised to call back or seek an in-person evaluation if the symptoms worsen or if the condition fails to improve as anticipated.  I provided 5 minutes of non-face-to-face time during this encounter.   Irean Hong, NP

## 2021-07-18 ENCOUNTER — Encounter: Payer: Self-pay | Admitting: Internal Medicine

## 2021-07-23 ENCOUNTER — Telehealth: Payer: Self-pay | Admitting: Internal Medicine

## 2021-07-23 ENCOUNTER — Other Ambulatory Visit (HOSPITAL_COMMUNITY): Payer: Self-pay

## 2021-07-23 NOTE — Telephone Encounter (Signed)
Patient called and stated that she was unable to connect with triage this afternoon. She has been having "shooting pain in her head radiating from my back" when she tries to use the bathroom. She would like the nurse to call her back when possible.  (She was advised that it would most likely not be addressed until Monday and she voiced understanding).

## 2021-07-26 NOTE — Telephone Encounter (Signed)
[name] - will you reach out to the patient to see if she is feeling better.

## 2021-07-27 ENCOUNTER — Other Ambulatory Visit (HOSPITAL_COMMUNITY): Payer: Self-pay

## 2021-07-30 ENCOUNTER — Telehealth: Payer: Self-pay | Admitting: Internal Medicine

## 2021-07-30 NOTE — Telephone Encounter (Signed)
Patient called to reschedule her 8/8 appointments to 8/15 due to beach trip and will not be back until Sunday the 14th.  Appointments rescheduled, patient confirmed.  Routing to Dr. Sharmaine Base team.

## 2021-08-02 ENCOUNTER — Inpatient Hospital Stay: Payer: Medicaid Other

## 2021-08-02 ENCOUNTER — Inpatient Hospital Stay: Payer: Medicaid Other | Admitting: Family Medicine

## 2021-08-02 ENCOUNTER — Inpatient Hospital Stay: Payer: Medicaid Other | Admitting: Internal Medicine

## 2021-08-09 ENCOUNTER — Telehealth: Payer: Self-pay | Admitting: *Deleted

## 2021-08-09 ENCOUNTER — Inpatient Hospital Stay: Payer: Medicaid Other | Admitting: Internal Medicine

## 2021-08-09 ENCOUNTER — Inpatient Hospital Stay: Payer: Medicaid Other

## 2021-08-09 ENCOUNTER — Encounter: Payer: Self-pay | Admitting: *Deleted

## 2021-08-09 NOTE — Telephone Encounter (Signed)
Patient no showed for her chemotherapy apt. I left a msg - requesting patient to return our phone call.

## 2021-08-09 NOTE — Telephone Encounter (Signed)
Pt called in regards to missed appointment this morning. Appointment has been rescheduled for 8/18.

## 2021-08-12 ENCOUNTER — Inpatient Hospital Stay (HOSPITAL_BASED_OUTPATIENT_CLINIC_OR_DEPARTMENT_OTHER): Payer: Medicaid Other | Admitting: Internal Medicine

## 2021-08-12 ENCOUNTER — Inpatient Hospital Stay: Payer: Medicaid Other

## 2021-08-12 ENCOUNTER — Inpatient Hospital Stay: Payer: Medicaid Other | Attending: Internal Medicine

## 2021-08-12 ENCOUNTER — Encounter: Payer: Self-pay | Admitting: Internal Medicine

## 2021-08-12 DIAGNOSIS — Z5112 Encounter for antineoplastic immunotherapy: Secondary | ICD-10-CM | POA: Diagnosis not present

## 2021-08-12 DIAGNOSIS — C482 Malignant neoplasm of peritoneum, unspecified: Secondary | ICD-10-CM | POA: Insufficient documentation

## 2021-08-12 DIAGNOSIS — Z7189 Other specified counseling: Secondary | ICD-10-CM

## 2021-08-12 LAB — URINALYSIS, COMPLETE (UACMP) WITH MICROSCOPIC
Bilirubin Urine: NEGATIVE
Glucose, UA: NEGATIVE mg/dL
Hgb urine dipstick: NEGATIVE
Ketones, ur: NEGATIVE mg/dL
Leukocytes,Ua: NEGATIVE
Nitrite: NEGATIVE
Protein, ur: 30 mg/dL — AB
Specific Gravity, Urine: 1.025 (ref 1.005–1.030)
pH: 5 (ref 5.0–8.0)

## 2021-08-12 LAB — COMPREHENSIVE METABOLIC PANEL
ALT: 18 U/L (ref 0–44)
AST: 20 U/L (ref 15–41)
Albumin: 3.9 g/dL (ref 3.5–5.0)
Alkaline Phosphatase: 51 U/L (ref 38–126)
Anion gap: 7 (ref 5–15)
BUN: 12 mg/dL (ref 6–20)
CO2: 27 mmol/L (ref 22–32)
Calcium: 9 mg/dL (ref 8.9–10.3)
Chloride: 103 mmol/L (ref 98–111)
Creatinine, Ser: 0.64 mg/dL (ref 0.44–1.00)
GFR, Estimated: >60 mL/min (ref >60)
Glucose, Bld: 111 mg/dL — ABNORMAL HIGH (ref 70–99)
Potassium: 3.8 mmol/L (ref 3.5–5.1)
Sodium: 137 mmol/L (ref 135–145)
Total Bilirubin: 0.8 mg/dL (ref 0.3–1.2)
Total Protein: 7.5 g/dL (ref 6.5–8.1)

## 2021-08-12 LAB — CBC WITH DIFFERENTIAL/PLATELET
Abs Immature Granulocytes: 0.02 10*3/uL (ref 0.00–0.07)
Basophils Absolute: 0 10*3/uL (ref 0.0–0.1)
Basophils Relative: 1 %
Eosinophils Absolute: 0.1 10*3/uL (ref 0.0–0.5)
Eosinophils Relative: 2 %
HCT: 39.2 % (ref 36.0–46.0)
Hemoglobin: 13.2 g/dL (ref 12.0–15.0)
Immature Granulocytes: 0 %
Lymphocytes Relative: 33 %
Lymphs Abs: 1.5 10*3/uL (ref 0.7–4.0)
MCH: 35.5 pg — ABNORMAL HIGH (ref 26.0–34.0)
MCHC: 33.7 g/dL (ref 30.0–36.0)
MCV: 105.4 fL — ABNORMAL HIGH (ref 80.0–100.0)
Monocytes Absolute: 0.4 10*3/uL (ref 0.1–1.0)
Monocytes Relative: 9 %
Neutro Abs: 2.6 10*3/uL (ref 1.7–7.7)
Neutrophils Relative %: 55 %
Platelets: 147 10*3/uL — ABNORMAL LOW (ref 150–400)
RBC: 3.72 MIL/uL — ABNORMAL LOW (ref 3.87–5.11)
RDW: 24.4 % — ABNORMAL HIGH (ref 11.5–15.5)
Smear Review: NORMAL
WBC: 4.7 10*3/uL (ref 4.0–10.5)
nRBC: 0 % (ref 0.0–0.2)

## 2021-08-12 MED ORDER — HEPARIN SOD (PORK) LOCK FLUSH 100 UNIT/ML IV SOLN
INTRAVENOUS | Status: AC
  Administered 2021-08-12: 500 [IU]
  Filled 2021-08-12: qty 5

## 2021-08-12 MED ORDER — HEPARIN SOD (PORK) LOCK FLUSH 100 UNIT/ML IV SOLN
500.0000 [IU] | Freq: Once | INTRAVENOUS | Status: AC | PRN
  Filled 2021-08-12: qty 5

## 2021-08-12 MED ORDER — SODIUM CHLORIDE 0.9 % IV SOLN
Freq: Once | INTRAVENOUS | Status: AC
  Filled 2021-08-12: qty 250

## 2021-08-12 MED ORDER — SODIUM CHLORIDE 0.9 % IV SOLN: 15.0000 mg/kg | Freq: Once | INTRAVENOUS | Status: DC

## 2021-08-12 MED ORDER — SODIUM CHLORIDE 0.9% FLUSH
10.0000 mL | Freq: Once | INTRAVENOUS | Status: AC
  Administered 2021-08-12: 10 mL via INTRAVENOUS
  Filled 2021-08-12: qty 10

## 2021-08-12 MED ORDER — SODIUM CHLORIDE 0.9 % IV SOLN
15.0000 mg/kg | Freq: Once | INTRAVENOUS | Status: AC
  Administered 2021-08-12: 1800 mg via INTRAVENOUS
  Filled 2021-08-12: qty 64

## 2021-08-12 NOTE — Assessment & Plan Note (Addendum)
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 STABLE; tumor markers- improving.  Currently off bevacizumab [last infusion 02/05/2021-secondary to ventral hernia surgery]. On avastin + lynparza.   # Proceed with avastin today; CBC CMP are reviewed; adequate today; no contraindications to chemotherapy.Continue Lynparza 200 mg twice a day [severe anemia while on 250 mg twice a day].   #Obesity/weight gain:  Discussed importance of healthy weight/and weight loss.  Strongly recommend eating more green leafy vegetables and cutting down processed food/ carbohydrates.  Instead increasing whole grains / protein in the diet.  Patient is motivated.  She has lost 10 pounds.  #Hypertension: Blood pressure 130/90.  Monitor closely on Avastin.  Continue metoprolol/clonidine hot flashes.  #  Severe recurrent anemia secondary to Lynparza [250 mg BID]-currently on 200 mg BID tolerating extremely well- today Hb 13- STABLE.   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

## 2021-08-12 NOTE — Progress Notes (Signed)
Wants to talk about getting something for sleep. Has been using OTC melatonin that does not help. Also is taking trazodone which is not helping. States she has even been doubling the medication with no change.
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
## 2021-08-13 ENCOUNTER — Encounter: Payer: Self-pay | Admitting: Hospice and Palliative Medicine

## 2021-08-13 ENCOUNTER — Other Ambulatory Visit: Payer: Self-pay | Admitting: *Deleted

## 2021-08-13 ENCOUNTER — Telehealth: Payer: Self-pay | Admitting: Hospice and Palliative Medicine

## 2021-08-13 ENCOUNTER — Telehealth: Payer: Self-pay | Admitting: *Deleted

## 2021-08-13 LAB — CA 125: Cancer Antigen (CA) 125: 5.3 U/mL (ref 0.0–38.1)

## 2021-08-13 MED ORDER — ESZOPICLONE 1 MG PO TABS: 1.0000 mg | ORAL_TABLET | Freq: Every evening | ORAL | 0 refills | Status: DC | PRN

## 2021-08-13 MED ORDER — OXYCODONE HCL 5 MG PO TABS: 2.5000 mg | ORAL_TABLET | Freq: Four times a day (QID) | ORAL | 0 refills | Status: DC | PRN

## 2021-08-13 NOTE — Telephone Encounter (Signed)
Patient needs a PA for Lunesta.

## 2021-08-13 NOTE — Telephone Encounter (Signed)
Spoke with patient by phone.  She reports difficulty sleeping and relieved with trazodone.  She has tried doubling the dose (100 mg) of trazodone without relief.  She is having difficulty initiating sleep.  She requests a different sleep medication.  We will trial Lunesta 1 mg nightly as needed.

## 2021-08-13 NOTE — Telephone Encounter (Signed)
Approvedtoday PA Case: [id number], Status: Approved, Coverage Starts on: 08/13/2021 12:00:00 AM, Coverage Ends on: 08/13/2022 12:00:00 AM.

## 2021-08-13 NOTE — Telephone Encounter (Signed)
My chart msg from patient.  [name] - please advise re: refill narcotic. Pt also requesting a sleep aid.    Good morning [name] I wanted to request a refill on my pain medication and also if you could prescribe something new for sleep. I haven't been sleeping good at all, I just toss and turn all nite long. Thank you [name]

## 2021-08-20 ENCOUNTER — Other Ambulatory Visit (HOSPITAL_COMMUNITY): Payer: Self-pay

## 2021-08-23 ENCOUNTER — Other Ambulatory Visit (HOSPITAL_COMMUNITY): Payer: Self-pay

## 2021-08-25 ENCOUNTER — Inpatient Hospital Stay: Payer: Medicaid Other

## 2021-08-31 ENCOUNTER — Encounter: Payer: Self-pay | Admitting: Family Medicine

## 2021-08-31 ENCOUNTER — Telehealth: Payer: Self-pay | Admitting: Internal Medicine

## 2021-08-31 ENCOUNTER — Ambulatory Visit: Payer: Medicaid Other | Admitting: Family Medicine

## 2021-08-31 ENCOUNTER — Other Ambulatory Visit: Payer: Self-pay

## 2021-08-31 VITALS — BP 132/80 | HR 86 | Temp 98.1°F | Resp 16 | Ht 63.0 in | Wt 271.0 lb

## 2021-08-31 DIAGNOSIS — E8941 Symptomatic postprocedural ovarian failure: Secondary | ICD-10-CM | POA: Diagnosis not present

## 2021-08-31 DIAGNOSIS — E785 Hyperlipidemia, unspecified: Secondary | ICD-10-CM | POA: Diagnosis not present

## 2021-08-31 DIAGNOSIS — G43109 Migraine with aura, not intractable, without status migrainosus: Secondary | ICD-10-CM

## 2021-08-31 DIAGNOSIS — T451X5A Adverse effect of antineoplastic and immunosuppressive drugs, initial encounter: Secondary | ICD-10-CM | POA: Diagnosis not present

## 2021-08-31 DIAGNOSIS — G620 Drug-induced polyneuropathy: Secondary | ICD-10-CM | POA: Diagnosis not present

## 2021-08-31 DIAGNOSIS — R7303 Prediabetes: Secondary | ICD-10-CM

## 2021-08-31 DIAGNOSIS — R7989 Other specified abnormal findings of blood chemistry: Secondary | ICD-10-CM

## 2021-08-31 DIAGNOSIS — F331 Major depressive disorder, recurrent, moderate: Secondary | ICD-10-CM

## 2021-08-31 DIAGNOSIS — M5136 Other intervertebral disc degeneration, lumbar region: Secondary | ICD-10-CM

## 2021-08-31 DIAGNOSIS — R000 Tachycardia, unspecified: Secondary | ICD-10-CM | POA: Diagnosis not present

## 2021-08-31 MED ORDER — PREGABALIN 75 MG PO CAPS: 75.0000 mg | ORAL_CAPSULE | Freq: Three times a day (TID) | ORAL | 1 refills | Status: DC

## 2021-08-31 MED ORDER — DULOXETINE HCL 60 MG PO CPEP: 120.0000 mg | ORAL_CAPSULE | Freq: Every day | ORAL | 1 refills | Status: DC

## 2021-08-31 MED ORDER — HYDROXYZINE HCL 10 MG PO TABS: 10.0000 mg | ORAL_TABLET | Freq: Three times a day (TID) | ORAL | 0 refills | Status: DC | PRN

## 2021-08-31 MED ORDER — CLONIDINE HCL 0.1 MG PO TABS: 0.1000 mg | ORAL_TABLET | Freq: Every evening | ORAL | 1 refills | Status: DC

## 2021-08-31 MED ORDER — METOPROLOL SUCCINATE ER 25 MG PO TB24: 12.5000 mg | ORAL_TABLET | Freq: Every day | ORAL | 1 refills | Status: DC

## 2021-08-31 MED ORDER — SUMATRIPTAN SUCCINATE 100 MG PO TABS: 100.0000 mg | ORAL_TABLET | ORAL | 0 refills | Status: DC | PRN

## 2021-08-31 NOTE — Progress Notes (Deleted)
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   Arm vein blood clot, right 03/11/2020   Malignant ascites 12/22/2019   Acute dyspnea 12/22/2019   Pleural effusion on right 12/22/2019   Tachycardia 12/22/2019   Primary peritoneal carcinomatosis (HCC) 12/16/2019   Goals of care, counseling/discussion 12/16/2019   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   Erythrocytosis 11/12/2019   Morbid obesity with BMI of 40.0-44.9, adult (HCC) 07/07/2018   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   Fever blister 05/17/2018   Family history of breast cancer 05/08/2018   Family history of colon cancer in mother 05/08/2018   Migraine with aura and without status migrainosus 04/18/2018   Dysmenorrhea 06/21/2017   Menorrhagia 06/21/2017   Mild recurrent major depression (HCC) 01/20/2016   Esophageal reflux 01/20/2016   Osteoarthritis, multiple sites 01/20/2016   Frequent headaches 01/20/2016    Past Surgical History:  Procedure Laterality Date   ABDOMINAL HYSTERECTOMY  03/2020   APPENDECTOMY     LSC but "ruptured when they did the surgery"   BREAST BIOPSY Left 01/04/2021   MRI BX   CESAREAN SECTION     CYSTOSCOPY N/A 04/08/2020   Procedure: CYSTOSCOPY;  Surgeon: Artelia Laroche, MD;  [name];  Service: Gynecology;  [name];   INSERTION OF MESH N/A 04/26/2021   Procedure: INSERTION OF MESH;  Surgeon: Earline Mayotte, MD;  [name];  Service: General;  [name];   IR THORACENTESIS ASP PLEURAL SPACE W/IMG GUIDE  12/06/2019   IUD REMOVAL N/A 04/08/2020   Procedure: INTRAUTERINE DEVICE (IUD) REMOVAL;  Surgeon: Artelia Laroche, MD;  [name];  Service:  Gynecology;  [name];   PARACENTESIS     x6   PORTA CATH INSERTION N/A 04/23/2020   Procedure: PORTA CATH INSERTION;  Surgeon: Annice Needy, MD;  Location: [facility];  Service: Cardiovascular;  [name];   TUBAL LIGATION     at time of CSxn   VENTRAL HERNIA REPAIR N/A 04/26/2021   Procedure: HERNIA REPAIR VENTRAL ADULT;  Surgeon: Earline Mayotte, MD;  [name];  Service: General;  [name];  need RNFA for the case   WRIST SURGERY Left 11/21/2016   plates and screws inserted    Family History  Adopted: Yes  Problem Relation Age of Onset   Lung cancer Father        deceased 13   Breast cancer Mother 84       currently 61   Colon cancer Mother    ADD / ADHD Son    ADD / ADHD Son    Early death Maternal Aunt    Breast cancer Maternal Aunt 34       deceased 34   Breast cancer Maternal Grandmother    Depression Daughter    Depression Daughter    Prostate cancer Paternal Uncle    Stroke Paternal Uncle    Leukemia Paternal Aunt    Breast cancer Paternal Grandmother    Cancer Maternal Uncle     Social History   Tobacco Use   Smoking status: Every Day    Packs/day: 0.50    Years: 30.00  Pack years: 15.00    Types: Cigarettes   Smokeless tobacco: Never  Substance Use Topics   Alcohol use: Not Currently    Alcohol/week: 0.0 standard drinks     Current Outpatient Medications:    cloNIDine (CATAPRES - DOSED IN MG/24 HR) 0.3 mg/24hr patch, APPLY 1 PATCH(0.3 MG) TOPICALLY TO THE SKIN 1 TIME A WEEK, Disp: 4 patch, Rfl: 5   cloNIDine (CATAPRES) 0.1 MG tablet, TAKE 1 TABLET(0.1 MG) BY MOUTH AT BEDTIME, Disp: 90 tablet, Rfl: 0   DULoxetine (CYMBALTA) 60 MG capsule, TAKE 2 CAPSULES( 120 MG TOTAL) BY MOUTH AT BEDTIME (Patient taking differently: Take 120 mg by mouth at bedtime.), Disp: 180 capsule, Rfl: 0   eszopiclone (LUNESTA) 1 MG TABS tablet, Take 1 tablet (1 mg total) by mouth at bedtime as needed for sleep. Take immediately before  bedtime, Disp: 30 tablet, Rfl: 0   lidocaine-prilocaine (EMLA) cream, Apply 1 application topically as needed. (Patient taking differently: Apply 1 application topically as needed [facility]).), Disp: 30 g, Rfl: 3   loratadine (CLARITIN) 10 MG tablet, TAKE 1 TABLET(10 MG) BY MOUTH EVERY MORNING (Patient taking differently: Take 10 mg by mouth daily.), Disp: 90 tablet, Rfl: 3   LORazepam (ATIVAN) 0.5 MG tablet, Take 1 tablet (0.5 mg total) by mouth every 8 (eight) hours as needed for anxiety., Disp: 60 tablet, Rfl: 0   Melatonin 10 MG SUBL, Take 1 tablet by mouth at bedtime., Disp: , Rfl:    metoprolol succinate (TOPROL-XL) 25 MG 24 hr tablet, Take 0.5 tablets (12.5 mg total) by mouth daily., Disp: 45 tablet, Rfl: 0   Multiple Vitamin (MULTIVITAMIN WITH MINERALS) TABS tablet, Take 1 tablet by mouth daily., Disp: , Rfl:    olaparib (LYNPARZA) 100 MG tablet, Take 2 tablets (200 mg total) by mouth 2 (two) times daily. Swallow whole. May take with food to decrease nausea and vomiting., Disp: 120 tablet, Rfl: 3   omeprazole (PRILOSEC) 20 MG capsule, TAKE 1 CAPSULE(20 MG) BY MOUTH DAILY, Disp: 90 capsule, Rfl: 1   ondansetron (ZOFRAN) 8 MG tablet, One pill every 8 hours as needed for nausea/vomitting., Disp: 40 tablet, Rfl: 1   ondansetron (ZOFRAN-ODT) 8 MG disintegrating tablet, Take 1 tablet (8 mg total) by mouth every 8 (eight) hours as needed for nausea or vomiting., Disp: 20 tablet, Rfl: 0   oxyCODONE (OXY IR/ROXICODONE) 5 MG immediate release tablet, Take 0.5-1 tablets (2.5-5 mg total) by mouth every 6 (six) hours as needed for severe pain., Disp: 60 tablet, Rfl: 0   polyethylene glycol powder (GLYCOLAX/MIRALAX) 17 GM/SCOOP powder, Take 0.5 Containers by mouth daily as needed for mild constipation or moderate constipation., Disp: , Rfl:    pregabalin (LYRICA) 75 MG capsule, Take 1 capsule (75 mg total) by mouth 3 (three) times daily., Disp: 270 capsule, Rfl: 0   prochlorperazine (COMPAZINE) 10 MG tablet,  Take 1 tablet (10 mg total) by mouth every 6 (six) hours as needed for nausea or vomiting., Disp: 40 tablet, Rfl: 1   SUMAtriptan (IMITREX) 100 MG tablet, Take 1 tablet (100 mg total) by mouth every 2 (two) hours as needed for migraine. May repeat in 2 hours if headache persists or recurs., Disp: 10 tablet, Rfl: 0   traZODone (DESYREL) 50 MG tablet, Take 0.5-1 tablets (25-50 mg total) by mouth at bedtime as needed for sleep., Disp: 30 tablet, Rfl: 2   valACYclovir (VALTREX) 1000 MG tablet, TAKE 1 TABLET(1000 MG) BY MOUTH TWICE DAILY AS NEEDED FOR OUTBREAK (Patient taking differently:  Take 1,000 mg by mouth daily as needed.), Disp: 6 tablet, Rfl: 0 No current facility-administered medications for this visit.  Facility-Administered Medications Ordered in Other Visits:    sodium chloride flush (NS) 0.9 % injection 10 mL, 10 mL, Intravenous, Once, Brahmanday, Elisha Headland, MD  No Active Allergies  I personally reviewed {Reviewed:14835} with the patient/caregiver today.   ROS  ***  Objective  There were no vitals filed for this visit.  There is no height or weight on file to calculate BMI.  Physical Exam ***  Recent Results (from the past 2160 hour(s))  Comprehensive metabolic panel     Status: Abnormal   Collection Time: 06/14/21 12:34 PM  Result Value Ref Range   Sodium 135 135 - 145 mmol/L   Potassium 4.0 3.5 - 5.1 mmol/L   Chloride 99 98 - 111 mmol/L   CO2 27 22 - 32 mmol/L   Glucose, Bld 106 (H) 70 - 99 mg/dL    Comment: Glucose reference range applies only to samples taken after fasting for at least 8 hours.   BUN 13 6 - 20 mg/dL   Creatinine, Ser 0.60 0.44 - 1.00 mg/dL   Calcium 9.0 8.9 - 10.3 mg/dL   Total Protein 7.1 6.5 - 8.1 g/dL   Albumin 3.6 3.5 - 5.0 g/dL   AST 16 15 - 41 U/L   ALT 17 0 - 44 U/L   Alkaline Phosphatase 66 38 - 126 U/L   Total Bilirubin 0.8 0.3 - 1.2 mg/dL   GFR, Estimated >60 >60 mL/min    Comment: (NOTE) Calculated using the CKD-EPI Creatinine  Equation (2021)    Anion gap 9 5 - 15    Comment: Performed at [facility], [address] [place], [place] [address]  CBC with Differential     Status: Abnormal   Collection Time: 06/14/21 12:34 PM  Result Value Ref Range   WBC 6.5 4.0 - 10.5 K/uL   RBC 4.31 3.87 - 5.11 MIL/uL   Hemoglobin 13.0 12.0 - 15.0 g/dL   HCT 41.0 36.0 - 46.0 %   MCV 95.1 80.0 - 100.0 fL   MCH 30.2 26.0 - 34.0 pg   MCHC 31.7 30.0 - 36.0 g/dL   RDW 16.8 (H) 11.5 - 15.5 %   Platelets 157 150 - 400 K/uL   nRBC 0.0 0.0 - 0.2 %   Neutrophils Relative % 63 %   Neutro Abs 4.1 1.7 - 7.7 K/uL   Lymphocytes Relative 26 %   Lymphs Abs 1.7 0.7 - 4.0 K/uL   Monocytes Relative 7 %   Monocytes Absolute 0.5 0.1 - 1.0 K/uL   Eosinophils Relative 2 %   Eosinophils Absolute 0.1 0.0 - 0.5 K/uL   Basophils Relative 1 %   Basophils Absolute 0.1 0.0 - 0.1 K/uL   Immature Granulocytes 1 %   Abs Immature Granulocytes 0.06 0.00 - 0.07 K/uL    Comment: Performed at [facility], [address] [place], [place] [address]  Hold Tube- Blood Bank     Status: None   Collection Time: 06/14/21 12:37 PM  Result Value Ref Range   Blood Bank Specimen SAMPLE AVAILABLE FOR TESTING    Sample Expiration      06/17/2021,2359 Performed at [facility], [address] [place], [place] [address]   CBC with Differential/Platelet     Status: Abnormal   Collection Time: 06/22/21  1:19 PM  Result Value Ref Range   WBC 7.0 4.0 -  10.5 K/uL   RBC 4.08 3.87 - 5.11 MIL/uL   Hemoglobin 12.4 12.0 - 15.0 g/dL   HCT 38.8 36.0 - 46.0 %   MCV 95.1 80.0 - 100.0 fL   MCH 30.4 26.0 - 34.0 pg   MCHC 32.0 30.0 - 36.0 g/dL   RDW 17.2 (H) 11.5 - 15.5 %   Platelets 150 150 - 400 K/uL   nRBC 0.0 0.0 - 0.2 %   Neutrophils Relative % 65 %   Neutro Abs 4.6 1.7 - 7.7 K/uL   Lymphocytes Relative 25 %   Lymphs Abs 1.7 0.7 - 4.0 K/uL   Monocytes Relative 7 %   Monocytes Absolute 0.5 0.1 - 1.0 K/uL   Eosinophils Relative 1 %    Eosinophils Absolute 0.1 0.0 - 0.5 K/uL   Basophils Relative 1 %   Basophils Absolute 0.0 0.0 - 0.1 K/uL   Immature Granulocytes 1 %   Abs Immature Granulocytes 0.05 0.00 - 0.07 K/uL    Comment: Performed at [facility], [address] [place], [place] [address]  Urinalysis, Complete w Microscopic     Status: Abnormal   Collection Time: 06/22/21  1:19 PM  Result Value Ref Range   Color, Urine YELLOW (A) YELLOW   APPearance CLOUDY (A) CLEAR   Specific Gravity, Urine 1.027 1.005 - 1.030   pH 6.0 5.0 - 8.0   Glucose, UA NEGATIVE NEGATIVE mg/dL   Hgb urine dipstick NEGATIVE NEGATIVE   Bilirubin Urine NEGATIVE NEGATIVE   Ketones, ur NEGATIVE NEGATIVE mg/dL   Protein, ur NEGATIVE NEGATIVE mg/dL   Nitrite NEGATIVE NEGATIVE   Leukocytes,Ua NEGATIVE NEGATIVE   RBC / HPF 11-20 0 - 5 RBC/hpf   WBC, UA 0-5 0 - 5 WBC/hpf   Bacteria, UA RARE (A) NONE SEEN   Squamous Epithelial / LPF 6-10 0 - 5   Mucus PRESENT     Comment: Performed at [facility], [address] [place], [place] [address]  Hold Tube- Blood Bank     Status: None   Collection Time: 06/22/21  1:19 PM  Result Value Ref Range   Blood Bank Specimen SAMPLE AVAILABLE FOR TESTING    Sample Expiration      06/25/2021,2359 Performed at [facility], [address] [place], [place] [address]   Comprehensive metabolic panel     Status: Abnormal   Collection Time: 06/22/21  1:19 PM  Result Value Ref Range   Sodium 136 135 - 145 mmol/L   Potassium 3.8 3.5 - 5.1 mmol/L   Chloride 103 98 - 111 mmol/L   CO2 27 22 - 32 mmol/L   Glucose, Bld 122 (H) 70 - 99 mg/dL    Comment: Glucose reference range applies only to samples taken after fasting for at least 8 hours.   BUN 16 6 - 20 mg/dL   Creatinine, Ser 0.81 0.44 - 1.00 mg/dL   Calcium 9.0 8.9 - 10.3 mg/dL   Total Protein 6.8 6.5 - 8.1 g/dL   Albumin 3.4 (L) 3.5 - 5.0 g/dL   AST 15 15 - 41 U/L   ALT 15 0 - 44 U/L   Alkaline Phosphatase 68 38 - 126 U/L    Total Bilirubin 0.7 0.3 - 1.2 mg/dL   GFR, Estimated >60 >60 mL/min    Comment: (NOTE) Calculated using the CKD-EPI Creatinine Equation (2021)    Anion gap 6 5 - 15    Comment: Performed at [facility], [address] [place],  [place] [address]  CA 125     Status: None   Collection Time: 07/12/21  1:20 PM  Result Value Ref Range   Cancer Antigen (CA) 125 5.6 0.0 - 38.1 U/mL    Comment: (NOTE) [employer] Electrochemiluminescence Immunoassay (ECLIA) Values obtained with different assay methods or kits cannot be used interchangeably.  Results cannot be interpreted as absolute evidence of the presence or absence of malignant disease. Performed At: [facility] [place], [place] [id number] Rush Farmer MD MH:[id number]   Comprehensive metabolic panel     Status: Abnormal   Collection Time: 07/12/21  1:20 PM  Result Value Ref Range   Sodium 134 (L) 135 - 145 mmol/L   Potassium 4.0 3.5 - 5.1 mmol/L   Chloride 102 98 - 111 mmol/L   CO2 24 22 - 32 mmol/L   Glucose, Bld 96 70 - 99 mg/dL    Comment: Glucose reference range applies only to samples taken after fasting for at least 8 hours.   BUN 15 6 - 20 mg/dL   Creatinine, Ser 0.82 0.44 - 1.00 mg/dL   Calcium 9.8 8.9 - 10.3 mg/dL   Total Protein 7.8 6.5 - 8.1 g/dL   Albumin 4.1 3.5 - 5.0 g/dL   AST 19 15 - 41 U/L   ALT 18 0 - 44 U/L   Alkaline Phosphatase 66 38 - 126 U/L   Total Bilirubin 0.8 0.3 - 1.2 mg/dL   GFR, Estimated >60 >60 mL/min    Comment: (NOTE) Calculated using the CKD-EPI Creatinine Equation (2021)    Anion gap 8 5 - 15    Comment: Performed at [facility], [address] [place], [place] [address]  CBC with Differential/Platelet     Status: Abnormal   Collection Time: 07/12/21  1:20 PM  Result Value Ref Range   WBC 7.8 4.0 - 10.5 K/uL   RBC 4.52 3.87 - 5.11 MIL/uL   Hemoglobin 14.1 12.0 - 15.0 g/dL   HCT 43.0 36.0 - 46.0 %   MCV 95.1 80.0 - 100.0 fL   MCH  31.2 26.0 - 34.0 pg   MCHC 32.8 30.0 - 36.0 g/dL   RDW 20.8 (H) 11.5 - 15.5 %   Platelets 185 150 - 400 K/uL   nRBC 0.3 (H) 0.0 - 0.2 %   Neutrophils Relative % 63 %   Neutro Abs 5.0 1.7 - 7.7 K/uL   Lymphocytes Relative 27 %   Lymphs Abs 2.1 0.7 - 4.0 K/uL   Monocytes Relative 7 %   Monocytes Absolute 0.5 0.1 - 1.0 K/uL   Eosinophils Relative 1 %   Eosinophils Absolute 0.1 0.0 - 0.5 K/uL   Basophils Relative 1 %   Basophils Absolute 0.1 0.0 - 0.1 K/uL   Immature Granulocytes 1 %   Abs Immature Granulocytes 0.04 0.00 - 0.07 K/uL    Comment: Performed at [facility], [address] [place], [place] [address]  Urinalysis, Complete w Microscopic     Status: Abnormal   Collection Time: 07/12/21  1:20 PM  Result Value Ref Range   Color, Urine AMBER (A) YELLOW    Comment: BIOCHEMICALS MAY BE AFFECTED BY COLOR   APPearance TURBID (A) CLEAR   Specific Gravity, Urine 1.029 1.005 - 1.030   pH 5.0 5.0 - 8.0   Glucose, UA NEGATIVE NEGATIVE mg/dL   Hgb urine dipstick NEGATIVE NEGATIVE   Bilirubin Urine NEGATIVE NEGATIVE   Ketones, ur NEGATIVE NEGATIVE mg/dL   Protein,  ur 30 (A) NEGATIVE mg/dL   Nitrite NEGATIVE NEGATIVE   Leukocytes,Ua TRACE (A) NEGATIVE   RBC / HPF 21-50 0 - 5 RBC/hpf   WBC, UA 0-5 0 - 5 WBC/hpf   Bacteria, UA RARE (A) NONE SEEN   Squamous Epithelial / LPF 11-20 0 - 5   Mucus PRESENT    Hyaline Casts, UA PRESENT    Ca Oxalate Crys, UA PRESENT     Comment: Performed at [facility], [address] [place], [place] [address]  Urinalysis, Complete w Microscopic     Status: Abnormal   Collection Time: 08/12/21  9:16 AM  Result Value Ref Range   Color, Urine AMBER (A) YELLOW    Comment: BIOCHEMICALS MAY BE AFFECTED BY COLOR   APPearance CLOUDY (A) CLEAR   Specific Gravity, Urine 1.025 1.005 - 1.030   pH 5.0 5.0 - 8.0   Glucose, UA NEGATIVE NEGATIVE mg/dL   Hgb urine dipstick NEGATIVE NEGATIVE   Bilirubin Urine NEGATIVE NEGATIVE   Ketones, ur  NEGATIVE NEGATIVE mg/dL   Protein, ur 30 (A) NEGATIVE mg/dL   Nitrite NEGATIVE NEGATIVE   Leukocytes,Ua NEGATIVE NEGATIVE   RBC / HPF 11-20 0 - 5 RBC/hpf   WBC, UA 21-50 0 - 5 WBC/hpf   Bacteria, UA MANY (A) NONE SEEN   Squamous Epithelial / LPF 11-20 0 - 5   Mucus PRESENT    Hyaline Casts, UA PRESENT    Uric Acid Crys, UA PRESENT     Comment: Performed at [facility], [address] [place], [place] [address]  CA 125     Status: None   Collection Time: 08/12/21  9:16 AM  Result Value Ref Range   Cancer Antigen (CA) 125 5.3 0.0 - 38.1 U/mL    Comment: (NOTE) [employer] Electrochemiluminescence Immunoassay (ECLIA) Values obtained with different assay methods or kits cannot be used interchangeably.  Results cannot be interpreted as absolute evidence of the presence or absence of malignant disease. Performed At: [facility] [place], [place] [id number] Rush Farmer MD [id number]   Comprehensive metabolic panel     Status: Abnormal   Collection Time: 08/12/21  9:16 AM  Result Value Ref Range   Sodium 137 135 - 145 mmol/L   Potassium 3.8 3.5 - 5.1 mmol/L   Chloride 103 98 - 111 mmol/L   CO2 27 22 - 32 mmol/L   Glucose, Bld 111 (H) 70 - 99 mg/dL    Comment: Glucose reference range applies only to samples taken after fasting for at least 8 hours.   BUN 12 6 - 20 mg/dL   Creatinine, Ser 0.64 0.44 - 1.00 mg/dL   Calcium 9.0 8.9 - 10.3 mg/dL   Total Protein 7.5 6.5 - 8.1 g/dL   Albumin 3.9 3.5 - 5.0 g/dL   AST 20 15 - 41 U/L   ALT 18 0 - 44 U/L   Alkaline Phosphatase 51 38 - 126 U/L   Total Bilirubin 0.8 0.3 - 1.2 mg/dL   GFR, Estimated >60 >60 mL/min    Comment: (NOTE) Calculated using the CKD-EPI Creatinine Equation (2021)    Anion gap 7 5 - 15    Comment: Performed at [facility], [address] [place], [place] [address]  CBC with Differential     Status: Abnormal   Collection Time: 08/12/21  9:16 AM  Result Value  Ref Range   WBC 4.7 4.0 - 10.5 K/uL   RBC 3.72 (L) 3.87 -  5.11 MIL/uL   Hemoglobin 13.2 12.0 - 15.0 g/dL   HCT 39.2 36.0 - 46.0 %   MCV 105.4 (H) 80.0 - 100.0 fL   MCH 35.5 (H) 26.0 - 34.0 pg   MCHC 33.7 30.0 - 36.0 g/dL   RDW 24.4 (H) 11.5 - 15.5 %   Platelets 147 (L) 150 - 400 K/uL   nRBC 0.0 0.0 - 0.2 %   Neutrophils Relative % 55 %   Neutro Abs 2.6 1.7 - 7.7 K/uL   Lymphocytes Relative 33 %   Lymphs Abs 1.5 0.7 - 4.0 K/uL   Monocytes Relative 9 %   Monocytes Absolute 0.4 0.1 - 1.0 K/uL   Eosinophils Relative 2 %   Eosinophils Absolute 0.1 0.0 - 0.5 K/uL   Basophils Relative 1 %   Basophils Absolute 0.0 0.0 - 0.1 K/uL   WBC Morphology DIFF CONFIRMED BY MANUAL. MORPHOLOGY UNREMARKABLE    RBC Morphology MIXED RBC POPULATION    Smear Review Normal platelet morphology     Comment: PLATELETS APPEAR ADEQUATE   Immature Granulocytes 0 %   Abs Immature Granulocytes 0.02 0.00 - 0.07 K/uL    Comment: Performed at [facility], [address] [place], [place] [address]    Diabetic Foot Exam: Diabetic Foot Exam - Simple   No data filed    ***  PHQ2/9: Depression screen [facility] 2/9 01/14/2021 06/09/2020 03/24/2020  Decreased Interest 2 0 0  Down, Depressed, Hopeless 2 0 0  PHQ - 2 Score 4 0 0  Altered sleeping 2 1 0  Tired, decreased energy 2 0 0  Change in appetite 1 0 0  Feeling bad or failure about yourself  2 0 0  Trouble concentrating 0 0 0  Moving slowly or fidgety/restless 0 0 0  Suicidal thoughts 0 0 0  PHQ-9 Score 11 1 0  Difficult doing work/chores - Not difficult at all -  Some recent data might be hidden    phq 9 is {gen pos [id number]} ***  Fall Risk: Fall Risk  01/14/2021 01/14/2021 06/09/2020 03/24/2020 12/10/2019  Falls in the past year? 0 0 0 0 0  Number falls in past yr: - 0 - 0 0  Injury with Fall? - 0 - 0 0   ***   Functional Status Survey:   ***   Assessment & Plan  *** There are no diagnoses linked to this encounter.

## 2021-08-31 NOTE — Progress Notes (Signed)
Name: [name]   MRN: [id number]    DOB: 1971-05-08   Date:08/31/2021       Progress Note  Subjective  Chief Complaint  Follow Up  HPI  HTN/Tachycardia: currently on metoprolol  25 mg and heart rate is normal now, bp is at goal ,no chest pain or palpitation    Morbid obesity: she lost weight during chemo, but  since January weight has been stable. 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 Alto Denver. 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 She takes Duloxetine and Lyrica and seems to be controlling symptoms of neuropathy, pain level is 4 right now, average pain is 5.5    Insomnia: she is now taking Lunesta 2 mg , given by oncologist and is doing well, able to fall and stay asleep. 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 Phq 9 went down from 11 to 8 with increase dose of Duloxetine ( dose adjusted May 2022 ), and gave her buspar. She states buspirone caused her to feel loopy .Advised to change time that she is taking Duloxetine from pm to am.   Ventral hernia: incidental finding on CT done 12/28/2020,  she had a ventral hernia repair May 2 nd and is doing well since   Dyslipidemia: last LDL was 131   The 10-year ASCVD risk score Mikey Bussing DC Brooke Bonito., et al., 2013)  is: 5.1%   Values used to calculate the score:     Age: 50 years     Sex: Female     Is Non-Hispanic African American: No     Diabetic: No     Tobacco smoker: Yes     Systolic Blood Pressure: 784 mmHg     Is BP treated: No     HDL Cholesterol: 46 mg/dL     Total Cholesterol: 219 mg/dL   Pre-diabetes: A1C was 6.2 % in January, she has polyphagia, polydipsia and polyuria   Elevated TSH: we will recheck level today. 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[place]) 04/08/2020   Arm vein blood clot, right 03/11/2020   Malignant ascites 12/22/2019   Acute dyspnea 12/22/2019   Pleural effusion on right 12/22/2019   Tachycardia 12/22/2019   Primary peritoneal carcinomatosis ([place]) 12/16/2019   Goals of care, counseling/discussion 12/16/2019   Serous adenocarcinoma ([place]) 12/11/2019   Erythrocytosis 11/12/2019   Morbid obesity with BMI of 40.0-44.9, adult ([place]) 07/07/2018   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   Fever blister 05/17/2018   Family history of breast cancer 05/08/2018  Family history of colon cancer in mother 05/08/2018   Migraine with aura and without status migrainosus 04/18/2018   Dysmenorrhea 06/21/2017   Menorrhagia 06/21/2017   Mild recurrent major depression ([place]) 01/20/2016   Esophageal reflux 01/20/2016   Osteoarthritis, multiple sites 01/20/2016   Frequent headaches 01/20/2016    Past Surgical History:  Procedure Laterality Date   ABDOMINAL HYSTERECTOMY  03/2020   APPENDECTOMY     LSC but "ruptured when they did the surgery"   BREAST BIOPSY Left 01/04/2021   MRI BX   CESAREAN SECTION     CYSTOSCOPY N/A 04/08/2020   Procedure: CYSTOSCOPY;  [name], MD;  [name];  Service: Gynecology;  [name];   INSERTION OF MESH N/A 04/26/2021   Procedure: INSERTION OF MESH;   [name], MD;  [name];  Service: General;  [name];   IR THORACENTESIS ASP PLEURAL SPACE W/IMG GUIDE  12/06/2019   IUD REMOVAL N/A 04/08/2020   Procedure: INTRAUTERINE DEVICE (IUD) REMOVAL;  [name], MD;  [name];  Service: Gynecology;  [name];   PARACENTESIS     x6   PORTA CATH INSERTION N/A 04/23/2020   Procedure: PORTA CATH INSERTION;  [name], MD;  Location: [facility];  Service: Cardiovascular;  [name];   TUBAL LIGATION     at time of CSxn   VENTRAL HERNIA REPAIR N/A 04/26/2021   Procedure: HERNIA REPAIR VENTRAL ADULT;  [name], MD;  [name];  Service: General;  [name];  need RNFA for the case   WRIST SURGERY Left 11/21/2016   plates and screws inserted    Family History  Adopted: Yes  Problem Relation Age of Onset   Lung cancer Father        deceased 57   Breast cancer Mother 2       currently 33   Colon cancer Mother    ADD / ADHD Son    ADD / ADHD Son    Early death Maternal Aunt    Breast cancer Maternal Aunt 70       deceased 24   Breast cancer Maternal Grandmother    Depression Daughter    Depression Daughter    Prostate cancer Paternal Uncle    Stroke Paternal Uncle    Leukemia Paternal Aunt    Breast cancer Paternal Grandmother    Cancer Maternal Uncle     Social History   Tobacco Use   Smoking status: Every Day    Packs/day: 0.50    Years: 30.00    Pack years: 15.00    Types: Cigarettes   Smokeless tobacco: Never  Substance Use Topics   Alcohol use: Not Currently    Alcohol/week: 0.0 standard drinks     Current Outpatient Medications:    cloNIDine (CATAPRES - DOSED IN MG/24 HR) 0.3 mg/24hr patch, APPLY 1 PATCH(0.3 MG) TOPICALLY TO THE SKIN 1 TIME A WEEK, Disp: 4 patch, Rfl: 5   eszopiclone (LUNESTA) 1 MG TABS tablet, Take 1 tablet (1 mg total) by mouth at bedtime as needed for sleep. Take  immediately before bedtime, Disp: 30 tablet, Rfl: 0   hydrOXYzine (ATARAX/VISTARIL) 10 MG tablet, Take 1 tablet (10 mg total) by mouth 3 (three) times daily as needed., Disp: 30 tablet, Rfl: 0   lidocaine-prilocaine (EMLA) cream, Apply 1 application topically as needed. (Patient taking differently: Apply 1 application topically as needed [facility]).), Disp: 30  g, Rfl: 3   loratadine (CLARITIN) 10 MG tablet, TAKE 1 TABLET(10 MG) BY MOUTH EVERY MORNING, Disp: 90 tablet, Rfl: 3   LORazepam (ATIVAN) 0.5 MG tablet, Take 1 tablet (0.5 mg total) by mouth every 8 (eight) hours as needed for anxiety., Disp: 60 tablet, Rfl: 0   Melatonin 10 MG SUBL, Take 1 tablet by mouth at bedtime., Disp: , Rfl:    Multiple Vitamin (MULTIVITAMIN WITH MINERALS) TABS tablet, Take 1 tablet by mouth daily., Disp: , Rfl:    olaparib (LYNPARZA) 100 MG tablet, Take 2 tablets (200 mg total) by mouth 2 (two) times daily. Swallow whole. May take with food to decrease nausea and vomiting., Disp: 120 tablet, Rfl: 3   omeprazole (PRILOSEC) 20 MG capsule, TAKE 1 CAPSULE(20 MG) BY MOUTH DAILY, Disp: 90 capsule, Rfl: 1   ondansetron (ZOFRAN) 8 MG tablet, One pill every 8 hours as needed for nausea/vomitting., Disp: 40 tablet, Rfl: 1   ondansetron (ZOFRAN-ODT) 8 MG disintegrating tablet, Take 1 tablet (8 mg total) by mouth every 8 (eight) hours as needed for nausea or vomiting., Disp: 20 tablet, Rfl: 0   oxyCODONE (OXY IR/ROXICODONE) 5 MG immediate release tablet, Take 0.5-1 tablets (2.5-5 mg total) by mouth every 6 (six) hours as needed for severe pain., Disp: 60 tablet, Rfl: 0   polyethylene glycol powder (GLYCOLAX/MIRALAX) 17 GM/SCOOP powder, Take 0.5 Containers by mouth daily as needed for mild constipation or moderate constipation., Disp: , Rfl:    prochlorperazine (COMPAZINE) 10 MG tablet, Take 1 tablet (10 mg total) by mouth every 6 (six) hours as needed for nausea or vomiting., Disp: 40 tablet, Rfl: 1   valACYclovir (VALTREX) 1000 MG  tablet, TAKE 1 TABLET(1000 MG) BY MOUTH TWICE DAILY AS NEEDED FOR OUTBREAK, Disp: 6 tablet, Rfl: 0   cloNIDine (CATAPRES) 0.1 MG tablet, Take 1 tablet (0.1 mg total) by mouth at bedtime., Disp: 90 tablet, Rfl: 1   DULoxetine (CYMBALTA) 60 MG capsule, Take 2 capsules (120 mg total) by mouth daily., Disp: 180 capsule, Rfl: 1   metoprolol succinate (TOPROL-XL) 25 MG 24 hr tablet, Take 0.5 tablets (12.5 mg total) by mouth daily., Disp: 45 tablet, Rfl: 1   pregabalin (LYRICA) 75 MG capsule, Take 1 capsule (75 mg total) by mouth 3 (three) times daily., Disp: 270 capsule, Rfl: 1   SUMAtriptan (IMITREX) 100 MG tablet, Take 1 tablet (100 mg total) by mouth every 2 (two) hours as needed for migraine. May repeat in 2 hours if headache persists or recurs., Disp: 10 tablet, Rfl: 0 No current facility-administered medications for this visit.  Facility-Administered Medications Ordered in Other Visits:    sodium chloride flush (NS) 0.9 % injection 10 mL, 10 mL, Intravenous, Once, Rogue Bussing, Elisha Headland, MD  No Active Allergies   I personally reviewed active problem list, medication list, allergies, family history, social history, health maintenance with the patient/caregiver today.   ROS  Constitutional: Negative for fever or weight change.  Respiratory: positive for  mild cough , she has clear rhinorrhea ( secondary to chemo) but no  shortness of breath or fever, advised some otc Coricidin for now .   Cardiovascular: Negative for chest pain or palpitations.  Gastrointestinal: Negative for abdominal pain, no bowel changes.  Musculoskeletal: Negative for gait problem or joint swelling.  Skin: Negative for rash.  Neurological: Negative for dizziness or headache.  No other specific complaints in a complete review of systems (except as listed in HPI above).   Objective  Vitals:  08/31/21 1345  BP: 132/80  Pulse: 86  Resp: 16  Temp: 98.1 F (36.7 C)  SpO2: 99%  Weight: 271 lb (122.9 kg)  Height: 5'  3" (1.6 m)    Body mass index is 48.01 kg/m.  Physical Exam  Constitutional: Patient appears well-developed and well-nourished. Obese  No distress.  HEENT: head atraumatic, normocephalic, pupils equal and reactive to light,neck supple Cardiovascular: Normal rate, regular rhythm and normal heart sounds.  No murmur heard. No BLE edema. Pulmonary/Chest: Effort normal and breath sounds normal. No respiratory distress. Abdominal: Soft.  There is no tenderness. Psychiatric: Patient has a normal mood and affect. behavior is normal. Judgment and thought content normal.   Recent Results (from the past 2160 hour(s))  Comprehensive metabolic panel     Status: Abnormal   Collection Time: 06/14/21 12:34 PM  Result Value Ref Range   Sodium 135 135 - 145 mmol/L   Potassium 4.0 3.5 - 5.1 mmol/L   Chloride 99 98 - 111 mmol/L   CO2 27 22 - 32 mmol/L   Glucose, Bld 106 (H) 70 - 99 mg/dL    Comment: Glucose reference range applies only to samples taken after fasting for at least 8 hours.   BUN 13 6 - 20 mg/dL   Creatinine, Ser 0.60 0.44 - 1.00 mg/dL   Calcium 9.0 8.9 - 10.3 mg/dL   Total Protein 7.1 6.5 - 8.1 g/dL   Albumin 3.6 3.5 - 5.0 g/dL   AST 16 15 - 41 U/L   ALT 17 0 - 44 U/L   Alkaline Phosphatase 66 38 - 126 U/L   Total Bilirubin 0.8 0.3 - 1.2 mg/dL   GFR, Estimated >60 >60 mL/min    Comment: (NOTE) Calculated using the CKD-EPI Creatinine Equation (2021)    Anion gap 9 5 - 15    Comment: Performed at [facility], [address] [place], [place] [address]  CBC with Differential     Status: Abnormal   Collection Time: 06/14/21 12:34 PM  Result Value Ref Range   WBC 6.5 4.0 - 10.5 K/uL   RBC 4.31 3.87 - 5.11 MIL/uL   Hemoglobin 13.0 12.0 - 15.0 g/dL   HCT 41.0 36.0 - 46.0 %   MCV 95.1 80.0 - 100.0 fL   MCH 30.2 26.0 - 34.0 pg   MCHC 31.7 30.0 - 36.0 g/dL   RDW 16.8 (H) 11.5 - 15.5 %   Platelets 157 150 - 400 K/uL   nRBC 0.0 0.0 - 0.2 %   Neutrophils Relative % 63 %    Neutro Abs 4.1 1.7 - 7.7 K/uL   Lymphocytes Relative 26 %   Lymphs Abs 1.7 0.7 - 4.0 K/uL   Monocytes Relative 7 %   Monocytes Absolute 0.5 0.1 - 1.0 K/uL   Eosinophils Relative 2 %   Eosinophils Absolute 0.1 0.0 - 0.5 K/uL   Basophils Relative 1 %   Basophils Absolute 0.1 0.0 - 0.1 K/uL   Immature Granulocytes 1 %   Abs Immature Granulocytes 0.06 0.00 - 0.07 K/uL    Comment: Performed at [facility], [address] [place], [place] [address]  Hold Tube- Blood Bank     Status: None   Collection Time: 06/14/21 12:37 PM  Result Value Ref Range   Blood Bank Specimen SAMPLE AVAILABLE FOR TESTING    Sample Expiration      06/17/2021,2359 Performed at [facility], [address], [place], [place] [address]   CBC with  Differential/Platelet     Status: Abnormal   Collection Time: 06/22/21  1:19 PM  Result Value Ref Range   WBC 7.0 4.0 - 10.5 K/uL   RBC 4.08 3.87 - 5.11 MIL/uL   Hemoglobin 12.4 12.0 - 15.0 g/dL   HCT 38.8 36.0 - 46.0 %   MCV 95.1 80.0 - 100.0 fL   MCH 30.4 26.0 - 34.0 pg   MCHC 32.0 30.0 - 36.0 g/dL   RDW 17.2 (H) 11.5 - 15.5 %   Platelets 150 150 - 400 K/uL   nRBC 0.0 0.0 - 0.2 %   Neutrophils Relative % 65 %   Neutro Abs 4.6 1.7 - 7.7 K/uL   Lymphocytes Relative 25 %   Lymphs Abs 1.7 0.7 - 4.0 K/uL   Monocytes Relative 7 %   Monocytes Absolute 0.5 0.1 - 1.0 K/uL   Eosinophils Relative 1 %   Eosinophils Absolute 0.1 0.0 - 0.5 K/uL   Basophils Relative 1 %   Basophils Absolute 0.0 0.0 - 0.1 K/uL   Immature Granulocytes 1 %   Abs Immature Granulocytes 0.05 0.00 - 0.07 K/uL    Comment: Performed at [facility], [address] [place], [place] [address]  Urinalysis, Complete w Microscopic     Status: Abnormal   Collection Time: 06/22/21  1:19 PM  Result Value Ref Range   Color, Urine YELLOW (A) YELLOW   APPearance CLOUDY (A) CLEAR   Specific Gravity, Urine 1.027 1.005 - 1.030   pH 6.0 5.0 - 8.0   Glucose, UA NEGATIVE NEGATIVE  mg/dL   Hgb urine dipstick NEGATIVE NEGATIVE   Bilirubin Urine NEGATIVE NEGATIVE   Ketones, ur NEGATIVE NEGATIVE mg/dL   Protein, ur NEGATIVE NEGATIVE mg/dL   Nitrite NEGATIVE NEGATIVE   Leukocytes,Ua NEGATIVE NEGATIVE   RBC / HPF 11-20 0 - 5 RBC/hpf   WBC, UA 0-5 0 - 5 WBC/hpf   Bacteria, UA RARE (A) NONE SEEN   Squamous Epithelial / LPF 6-10 0 - 5   Mucus PRESENT     Comment: Performed at [facility], [address] [name], [place] [address]  Hold Tube- Blood Bank     Status: None   Collection Time: 06/22/21  1:19 PM  Result Value Ref Range   Blood Bank Specimen SAMPLE AVAILABLE FOR TESTING    Sample Expiration      06/25/2021,2359 Performed at [facility], [address] [place], [place] [address]   Comprehensive metabolic panel     Status: Abnormal   Collection Time: 06/22/21  1:19 PM  Result Value Ref Range   Sodium 136 135 - 145 mmol/L   Potassium 3.8 3.5 - 5.1 mmol/L   Chloride 103 98 - 111 mmol/L   CO2 27 22 - 32 mmol/L   Glucose, Bld 122 (H) 70 - 99 mg/dL    Comment: Glucose reference range applies only to samples taken after fasting for at least 8 hours.   BUN 16 6 - 20 mg/dL   Creatinine, Ser 0.81 0.44 - 1.00 mg/dL   Calcium 9.0 8.9 - 10.3 mg/dL   Total Protein 6.8 6.5 - 8.1 g/dL   Albumin 3.4 (L) 3.5 - 5.0 g/dL   AST 15 15 - 41 U/L   ALT 15 0 - 44 U/L   Alkaline Phosphatase 68 38 - 126 U/L   Total Bilirubin 0.7 0.3 - 1.2 mg/dL   GFR, Estimated >60 >60 mL/min    Comment: (NOTE) Calculated using the CKD-EPI Creatinine  Equation (2021)    Anion gap 6 5 - 15    Comment: Performed at [facility], [place], [place] [address]  CA 125     Status: None   Collection Time: 07/12/21  1:20 PM  Result Value Ref Range   Cancer Antigen (CA) 125 5.6 0.0 - 38.1 U/mL    Comment: (NOTE) Roche [employer]ostics Electrochemiluminescence Immunoassay (ECLIA) Values obtained with different assay methods or kits cannot be used  interchangeably.  Results cannot be interpreted as absolute evidence of the presence or absence of malignant disease. Performed At: [facility] [place], [place] [id number] Rush Farmer MD [id number]   Comprehensive metabolic panel     Status: Abnormal   Collection Time: 07/12/21  1:20 PM  Result Value Ref Range   Sodium 134 (L) 135 - 145 mmol/L   Potassium 4.0 3.5 - 5.1 mmol/L   Chloride 102 98 - 111 mmol/L   CO2 24 22 - 32 mmol/L   Glucose, Bld 96 70 - 99 mg/dL    Comment: Glucose reference range applies only to samples taken after fasting for at least 8 hours.   BUN 15 6 - 20 mg/dL   Creatinine, Ser 0.82 0.44 - 1.00 mg/dL   Calcium 9.8 8.9 - 10.3 mg/dL   Total Protein 7.8 6.5 - 8.1 g/dL   Albumin 4.1 3.5 - 5.0 g/dL   AST 19 15 - 41 U/L   ALT 18 0 - 44 U/L   Alkaline Phosphatase 66 38 - 126 U/L   Total Bilirubin 0.8 0.3 - 1.2 mg/dL   GFR, Estimated >60 >60 mL/min    Comment: (NOTE) Calculated using the CKD-EPI Creatinine Equation (2021)    Anion gap 8 5 - 15    Comment: Performed at [facility], [address] [place], [place] [address]  CBC with Differential/Platelet     Status: Abnormal   Collection Time: 07/12/21  1:20 PM  Result Value Ref Range   WBC 7.8 4.0 - 10.5 K/uL   RBC 4.52 3.87 - 5.11 MIL/uL   Hemoglobin 14.1 12.0 - 15.0 g/dL   HCT 43.0 36.0 - 46.0 %   MCV 95.1 80.0 - 100.0 fL   MCH 31.2 26.0 - 34.0 pg   MCHC 32.8 30.0 - 36.0 g/dL   RDW 20.8 (H) 11.5 - 15.5 %   Platelets 185 150 - 400 K/uL   nRBC 0.3 (H) 0.0 - 0.2 %   Neutrophils Relative % 63 %   Neutro Abs 5.0 1.7 - 7.7 K/uL   Lymphocytes Relative 27 %   Lymphs Abs 2.1 0.7 - 4.0 K/uL   Monocytes Relative 7 %   Monocytes Absolute 0.5 0.1 - 1.0 K/uL   Eosinophils Relative 1 %   Eosinophils Absolute 0.1 0.0 - 0.5 K/uL   Basophils Relative 1 %   Basophils Absolute 0.1 0.0 - 0.1 K/uL   Immature Granulocytes 1 %   Abs Immature Granulocytes 0.04 0.00 - 0.07 K/uL     Comment: Performed at [facility], [address] [place], [place] [address]  Urinalysis, Complete w Microscopic     Status: Abnormal   Collection Time: 07/12/21  1:20 PM  Result Value Ref Range   Color, Urine AMBER (A) YELLOW    Comment: BIOCHEMICALS MAY BE AFFECTED BY COLOR   APPearance TURBID (A) CLEAR   Specific Gravity, Urine 1.029 1.005 - 1.030   pH 5.0 5.0 - 8.0   Glucose, UA  NEGATIVE NEGATIVE mg/dL   Hgb urine dipstick NEGATIVE NEGATIVE   Bilirubin Urine NEGATIVE NEGATIVE   Ketones, ur NEGATIVE NEGATIVE mg/dL   Protein, ur 30 (A) NEGATIVE mg/dL   Nitrite NEGATIVE NEGATIVE   Leukocytes,Ua TRACE (A) NEGATIVE   RBC / HPF 21-50 0 - 5 RBC/hpf   WBC, UA 0-5 0 - 5 WBC/hpf   Bacteria, UA RARE (A) NONE SEEN   Squamous Epithelial / LPF 11-20 0 - 5   Mucus PRESENT    Hyaline Casts, UA PRESENT    Ca Oxalate Crys, UA PRESENT     Comment: Performed at [facility], [address] [place], [place] [address]  Urinalysis, Complete w Microscopic     Status: Abnormal   Collection Time: 08/12/21  9:16 AM  Result Value Ref Range   Color, Urine AMBER (A) YELLOW    Comment: BIOCHEMICALS MAY BE AFFECTED BY COLOR   APPearance CLOUDY (A) CLEAR   Specific Gravity, Urine 1.025 1.005 - 1.030   pH 5.0 5.0 - 8.0   Glucose, UA NEGATIVE NEGATIVE mg/dL   Hgb urine dipstick NEGATIVE NEGATIVE   Bilirubin Urine NEGATIVE NEGATIVE   Ketones, ur NEGATIVE NEGATIVE mg/dL   Protein, ur 30 (A) NEGATIVE mg/dL   Nitrite NEGATIVE NEGATIVE   Leukocytes,Ua NEGATIVE NEGATIVE   RBC / HPF 11-20 0 - 5 RBC/hpf   WBC, UA 21-50 0 - 5 WBC/hpf   Bacteria, UA MANY (A) NONE SEEN   Squamous Epithelial / LPF 11-20 0 - 5   Mucus PRESENT    Hyaline Casts, UA PRESENT    Uric Acid Crys, UA PRESENT     Comment: Performed at [facility], [address] [place], [place] [address]  CA 125     Status: None   Collection Time: 08/12/21  9:16 AM  Result Value Ref Range   Cancer Antigen ([place]) 125 5.3  0.0 - 38.1 U/mL    Comment: (NOTE) Roche [employer]ostics Electrochemiluminescence Immunoassay (ECLIA) Values obtained with different assay methods or kits cannot be used interchangeably.  Results cannot be interpreted as absolute evidence of the presence or absence of malignant disease. Performed At: [facility] [place], [place] [id number] Rush Farmer MD [id number]   Comprehensive metabolic panel     Status: Abnormal   Collection Time: 08/12/21  9:16 AM  Result Value Ref Range   Sodium 137 135 - 145 mmol/L   Potassium 3.8 3.5 - 5.1 mmol/L   Chloride 103 98 - 111 mmol/L   CO2 27 22 - 32 mmol/L   Glucose, Bld 111 (H) 70 - 99 mg/dL    Comment: Glucose reference range applies only to samples taken after fasting for at least 8 hours.   BUN 12 6 - 20 mg/dL   Creatinine, Ser 0.64 0.44 - 1.00 mg/dL   Calcium 9.0 8.9 - 10.3 mg/dL   Total Protein 7.5 6.5 - 8.1 g/dL   Albumin 3.9 3.5 - 5.0 g/dL   AST 20 15 - 41 U/L   ALT 18 0 - 44 U/L   Alkaline Phosphatase 51 38 - 126 U/L   Total Bilirubin 0.8 0.3 - 1.2 mg/dL   GFR, Estimated >60 >60 mL/min    Comment: (NOTE) Calculated using the CKD-EPI Creatinine Equation (2021)    Anion gap 7 5 - 15    Comment: Performed at [facility], [address], [place], [place] [address]  CBC with Differential     Status: Abnormal  Collection Time: 08/12/21  9:16 AM  Result Value Ref Range   WBC 4.7 4.0 - 10.5 K/uL   RBC 3.72 (L) 3.87 - 5.11 MIL/uL   Hemoglobin 13.2 12.0 - 15.0 g/dL   HCT 39.2 36.0 - 46.0 %   MCV 105.4 (H) 80.0 - 100.0 fL   MCH 35.5 (H) 26.0 - 34.0 pg   MCHC 33.7 30.0 - 36.0 g/dL   RDW 24.4 (H) 11.5 - 15.5 %   Platelets 147 (L) 150 - 400 K/uL   nRBC 0.0 0.0 - 0.2 %   Neutrophils Relative % 55 %   Neutro Abs 2.6 1.7 - 7.7 K/uL   Lymphocytes Relative 33 %   Lymphs Abs 1.5 0.7 - 4.0 K/uL   Monocytes Relative 9 %   Monocytes Absolute 0.4 0.1 - 1.0 K/uL   Eosinophils Relative 2 %   Eosinophils  Absolute 0.1 0.0 - 0.5 K/uL   Basophils Relative 1 %   Basophils Absolute 0.0 0.0 - 0.1 K/uL   WBC Morphology DIFF CONFIRMED BY MANUAL. MORPHOLOGY UNREMARKABLE    RBC Morphology MIXED RBC POPULATION    Smear Review Normal platelet morphology     Comment: PLATELETS APPEAR ADEQUATE   Immature Granulocytes 0 %   Abs Immature Granulocytes 0.02 0.00 - 0.07 K/uL    Comment: Performed at [facility], [address] [place], [place] [address]     PHQ2/9: Depression screen [facility] 2/9 08/31/2021 01/14/2021 06/09/2020 03/24/2020  Decreased Interest 1 2 0 0  Down, Depressed, Hopeless 1 2 0 0  PHQ - 2 Score 2 4 0 0  Altered sleeping 0 2 1 0  Tired, decreased energy 3 2 0 0  Change in appetite 2 1 0 0  Feeling bad or failure about yourself  1 2 0 0  Trouble concentrating 0 0 0 0  Moving slowly or fidgety/restless 0 0 0 0  Suicidal thoughts 0 0 0 0  PHQ-9 Score _0 0  Difficult doing work/chores - - Not difficult at all -  Some recent data might be hidden    phq 9 is positive   Fall Risk: Fall Risk  08/31/2021 01/14/2021 01/14/2021 06/09/2020 03/24/2020  Falls in the past year? 0 0 0 0 0  Number falls in past yr: 0 - 0 - 0  Injury with Fall? 0 - 0 - 0  Risk for fall due to : No Fall Risks - - - -  Follow up Falls prevention discussed - - - -     Functional Status Survey: Is the patient deaf or have difficulty hearing?: No Does the patient have difficulty seeing, even when wearing glasses/contacts?: No Does the patient have difficulty concentrating, remembering, or making decisions?: No Does the patient have difficulty walking or climbing stairs?: No Does the patient have difficulty dressing or bathing?: No Does the patient have difficulty doing errands alone such as visiting a doctor's office or shopping?: No    Assessment & Plan  1. Moderate episode of recurrent major depressive disorder (HCC)  - DULoxetine (CYMBALTA) 60 MG capsule; Take 2 capsules (120 mg total) by mouth  daily.  Dispense: 180 capsule; Refill: 1  We will add low dose hydroxizine for anxiety also   2. DDD (degenerative disc disease), lumbar  - DULoxetine (CYMBALTA) 60 MG capsule; Take 2 capsules (120 mg total) by mouth daily.  Dispense: 180 capsule; Refill: 1  3. Migraine with aura and without status migrainosus, not intractable  - metoprolol  succinate (TOPROL-XL) 25 MG 24 hr tablet; Take 0.5 tablets (12.5 mg total) by mouth daily.  Dispense: 45 tablet; Refill: 1 - SUMAtriptan (IMITREX) 100 MG tablet; Take 1 tablet (100 mg total) by mouth every 2 (two) hours as needed for migraine. May repeat in 2 hours if headache persists or recurs.  Dispense: 10 tablet; Refill: 0  4. Tachycardia  - metoprolol succinate (TOPROL-XL) 25 MG 24 hr tablet; Take 0.5 tablets (12.5 mg total) by mouth daily.  Dispense: 45 tablet; Refill: 1  5. Chemotherapy-induced neuropathy (HCC)  - pregabalin (LYRICA) 75 MG capsule; Take 1 capsule (75 mg total) by mouth 3 (three) times daily.  Dispense: 270 capsule; Refill: 1  6. Surgical menopause, symptomatic  - cloNIDine (CATAPRES) 0.1 MG tablet; Take 1 tablet (0.1 mg total) by mouth at bedtime.  Dispense: 90 tablet; Refill: 1  7. Morbid obesity ([place])  Discussed with the patient the risk posed by an increased BMI. Discussed importance of portion control, calorie counting and at least 150 minutes of physical activity weekly. Avoid sweet beverages and drink more water. Eat at least 6 servings of fruit and vegetables daily    8. Elevated TSH  - TSH  9. Pre-diabetes  - Hemoglobin A1c  10. Dyslipidemia  - Lipid panel

## 2021-08-31 NOTE — Telephone Encounter (Signed)
Patient called to cancel her 9/7 Gyn Onc appointment due to scheduling conflict.  Routing to team to see if her next md/avastin appointment can be scheduled on Wednesday so that she can couple her appointments as she lives in [place] and has to drive over an hour.  This is the second reschedule request.

## 2021-09-01 ENCOUNTER — Other Ambulatory Visit: Payer: Self-pay | Admitting: Family Medicine

## 2021-09-01 ENCOUNTER — Inpatient Hospital Stay: Payer: Medicaid Other | Admitting: Family Medicine

## 2021-09-01 ENCOUNTER — Inpatient Hospital Stay: Payer: Medicaid Other

## 2021-09-01 ENCOUNTER — Encounter: Payer: Self-pay | Admitting: Internal Medicine

## 2021-09-01 LAB — TSH: TSH: 3.44 mIU/L

## 2021-09-01 LAB — HEMOGLOBIN A1C
Hgb A1c MFr Bld: 5 % of total Hgb (ref <5.7)
Mean Plasma Glucose: 97 mg/dL
eAG (mmol/L): 5.4 mmol/L

## 2021-09-01 LAB — LIPID PANEL
Cholesterol: 248 mg/dL — ABNORMAL HIGH (ref <200)
HDL: 56 mg/dL (ref >=50)
LDL Cholesterol (Calc): 156 mg/dL (calc) — ABNORMAL HIGH
Non-HDL Cholesterol (Calc): 192 mg/dL (calc) — ABNORMAL HIGH (ref <130)
Total CHOL/HDL Ratio: 4.4 (calc) (ref <5.0)
Triglycerides: 197 mg/dL — ABNORMAL HIGH (ref <150)

## 2021-09-01 NOTE — Telephone Encounter (Signed)
C- please move apts to the 14'th

## 2021-09-02 ENCOUNTER — Inpatient Hospital Stay: Payer: Medicaid Other | Attending: Internal Medicine

## 2021-09-02 ENCOUNTER — Telehealth: Payer: Self-pay | Admitting: *Deleted

## 2021-09-02 ENCOUNTER — Inpatient Hospital Stay: Payer: Medicaid Other | Admitting: Internal Medicine

## 2021-09-02 ENCOUNTER — Encounter: Payer: Self-pay | Admitting: *Deleted

## 2021-09-02 ENCOUNTER — Inpatient Hospital Stay: Payer: Medicaid Other

## 2021-09-02 NOTE — Telephone Encounter (Signed)
Patient called this morning to scheduling line reporting that she has fever and wanted to reschedule her appointment for treatment today. I attempted to call patient for more information and got her voice mail so I left message asking that she return my call to find out if she is having other symptoms and how high her fever is. I will await a return call

## 2021-09-02 NOTE — Telephone Encounter (Signed)
Mychart msg sent to patient as well to follow-up on symptoms.

## 2021-09-03 NOTE — Telephone Encounter (Signed)
Reviewed chart- pt has not seen mychart msg. I attempted to reach patient. Phone # went to voice mail. Vm left requesting patient to return my phone call.

## 2021-09-06 ENCOUNTER — Telehealth: Payer: Self-pay | Admitting: *Deleted

## 2021-09-06 ENCOUNTER — Encounter: Payer: Self-pay | Admitting: Internal Medicine

## 2021-09-06 ENCOUNTER — Other Ambulatory Visit (HOSPITAL_COMMUNITY): Payer: Self-pay

## 2021-09-06 NOTE — Telephone Encounter (Signed)
Pt missed her apt last week due to acute illness. She is better. Negative for covid-19.  Brooke- She would like to r/s her apts for this week- port lab/ md / avastin. Please r/s apts and contact pt with new apt times.

## 2021-09-06 NOTE — Telephone Encounter (Signed)
[name] is handling the scheduling for our team today. We have attempted to reach her multiple times. She does not answer. Mychart msg sent. Pt requested that her tx be r/s for tomorrow, but I replied back that Dr. B is not in the clinic on Tuesday. Jerene Pitch is aware that pt needs to be put back on the schedule. We are waiting for the patient to call our office back. If pt r/s for Wed, we can certainly put her on the schedule for gyn.

## 2021-09-06 NOTE — Telephone Encounter (Signed)
I called patient who said that she spoke with Nira Conn this morning and I see that there is a separate note on this, soI will sign off onthis note

## 2021-09-06 NOTE — Telephone Encounter (Signed)
Thank you. She had requested to get her treatment and gyn onc on the same day due to travel lengths.

## 2021-09-06 NOTE — Telephone Encounter (Signed)
Heather, which scheduler can take care of this and add gyn onc same day?

## 2021-09-10 ENCOUNTER — Encounter: Payer: Self-pay | Admitting: Internal Medicine

## 2021-09-13 ENCOUNTER — Other Ambulatory Visit: Payer: Self-pay | Admitting: Hospice and Palliative Medicine

## 2021-09-13 ENCOUNTER — Inpatient Hospital Stay: Payer: Medicaid Other | Attending: Internal Medicine

## 2021-09-13 ENCOUNTER — Other Ambulatory Visit: Payer: Self-pay

## 2021-09-13 ENCOUNTER — Inpatient Hospital Stay: Payer: Medicaid Other

## 2021-09-13 ENCOUNTER — Encounter: Payer: Self-pay | Admitting: Internal Medicine

## 2021-09-13 ENCOUNTER — Inpatient Hospital Stay: Payer: Medicaid Other | Attending: Internal Medicine | Admitting: Internal Medicine

## 2021-09-13 DIAGNOSIS — C482 Malignant neoplasm of peritoneum, unspecified: Secondary | ICD-10-CM | POA: Insufficient documentation

## 2021-09-13 DIAGNOSIS — F17210 Nicotine dependence, cigarettes, uncomplicated: Secondary | ICD-10-CM | POA: Insufficient documentation

## 2021-09-13 DIAGNOSIS — G629 Polyneuropathy, unspecified: Secondary | ICD-10-CM | POA: Diagnosis not present

## 2021-09-13 DIAGNOSIS — Z5112 Encounter for antineoplastic immunotherapy: Secondary | ICD-10-CM | POA: Insufficient documentation

## 2021-09-13 DIAGNOSIS — D6481 Anemia due to antineoplastic chemotherapy: Secondary | ICD-10-CM | POA: Diagnosis not present

## 2021-09-13 DIAGNOSIS — I10 Essential (primary) hypertension: Secondary | ICD-10-CM | POA: Insufficient documentation

## 2021-09-13 DIAGNOSIS — Z7189 Other specified counseling: Secondary | ICD-10-CM

## 2021-09-13 LAB — URINALYSIS, COMPLETE (UACMP) WITH MICROSCOPIC
Bilirubin Urine: NEGATIVE
Glucose, UA: NEGATIVE mg/dL
Hgb urine dipstick: NEGATIVE
Leukocytes,Ua: NEGATIVE
Nitrite: NEGATIVE
Protein, ur: NEGATIVE mg/dL
Specific Gravity, Urine: >1.03 — ABNORMAL HIGH (ref 1.005–1.030)
pH: 5.5 (ref 5.0–8.0)

## 2021-09-13 LAB — CBC WITH DIFFERENTIAL/PLATELET
Abs Immature Granulocytes: 0.01 10*3/uL (ref 0.00–0.07)
Basophils Absolute: 0.1 10*3/uL (ref 0.0–0.1)
Basophils Relative: 1 %
Eosinophils Absolute: 0.1 10*3/uL (ref 0.0–0.5)
Eosinophils Relative: 2 %
HCT: 40.3 % (ref 36.0–46.0)
Hemoglobin: 13.4 g/dL (ref 12.0–15.0)
Immature Granulocytes: 0 %
Lymphocytes Relative: 28 %
Lymphs Abs: 1.4 10*3/uL (ref 0.7–4.0)
MCH: 36.9 pg — ABNORMAL HIGH (ref 26.0–34.0)
MCHC: 33.3 g/dL (ref 30.0–36.0)
MCV: 111 fL — ABNORMAL HIGH (ref 80.0–100.0)
Monocytes Absolute: 0.4 10*3/uL (ref 0.1–1.0)
Monocytes Relative: 9 %
Neutro Abs: 2.9 10*3/uL (ref 1.7–7.7)
Neutrophils Relative %: 60 %
Platelets: 117 10*3/uL — ABNORMAL LOW (ref 150–400)
RBC: 3.63 MIL/uL — ABNORMAL LOW (ref 3.87–5.11)
RDW: 19.7 % — ABNORMAL HIGH (ref 11.5–15.5)
WBC: 4.8 10*3/uL (ref 4.0–10.5)
nRBC: 0 % (ref 0.0–0.2)

## 2021-09-13 LAB — COMPREHENSIVE METABOLIC PANEL
ALT: 15 U/L (ref 0–44)
AST: 26 U/L (ref 15–41)
Albumin: 3.7 g/dL (ref 3.5–5.0)
Alkaline Phosphatase: 58 U/L (ref 38–126)
Anion gap: 11 (ref 5–15)
BUN: 13 mg/dL (ref 6–20)
CO2: 24 mmol/L (ref 22–32)
Calcium: 8.8 mg/dL — ABNORMAL LOW (ref 8.9–10.3)
Chloride: 100 mmol/L (ref 98–111)
Creatinine, Ser: 0.81 mg/dL (ref 0.44–1.00)
GFR, Estimated: >60 mL/min (ref >60)
Glucose, Bld: 146 mg/dL — ABNORMAL HIGH (ref 70–99)
Potassium: 3.6 mmol/L (ref 3.5–5.1)
Sodium: 135 mmol/L (ref 135–145)
Total Bilirubin: 0.5 mg/dL (ref 0.3–1.2)
Total Protein: 7.1 g/dL (ref 6.5–8.1)

## 2021-09-13 MED ORDER — HEPARIN SOD (PORK) LOCK FLUSH 100 UNIT/ML IV SOLN
500.0000 [IU] | Freq: Once | INTRAVENOUS | Status: AC
  Administered 2021-09-13: 500 [IU] via INTRAVENOUS
  Filled 2021-09-13: qty 5

## 2021-09-13 MED ORDER — SODIUM CHLORIDE 0.9 % IV SOLN
Freq: Once | INTRAVENOUS | Status: AC
  Filled 2021-09-13: qty 250

## 2021-09-13 MED ORDER — OXYCODONE HCL 5 MG PO TABS: 2.5000 mg | ORAL_TABLET | Freq: Four times a day (QID) | ORAL | 0 refills | Status: DC | PRN

## 2021-09-13 MED ORDER — ESZOPICLONE 2 MG PO TABS: 2.0000 mg | ORAL_TABLET | Freq: Every evening | ORAL | 2 refills | Status: DC | PRN

## 2021-09-13 MED ORDER — SODIUM CHLORIDE 0.9 % IV SOLN
15.0000 mg/kg | Freq: Once | INTRAVENOUS | Status: AC
  Administered 2021-09-13: 1700 mg via INTRAVENOUS
  Filled 2021-09-13: qty 64

## 2021-09-13 MED ORDER — SODIUM CHLORIDE 0.9% FLUSH
10.0000 mL | Freq: Once | INTRAVENOUS | Status: AC
  Administered 2021-09-13: 10 mL via INTRAVENOUS
  Filled 2021-09-13: qty 10
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
## 2021-09-13 NOTE — Assessment & Plan Note (Signed)
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 STABLE; tumor markers- improving.  Currently off bevacizumab [last infusion 02/05/2021-secondary to ventral hernia surgery]. On avastin + lynparza.   # Proceed with avastin today; CBC CMP are reviewed; adequate today; no contraindications to chemotherapy.Continue Lynparza 200 mg twice a day [severe anemia while on 250 mg twice a day].   #Hypertension: Blood pressure 131/80.  Monitor closely on Avastin.  Continue metoprolol/clonidine hot flashes. STABLE.   #  Severe recurrent anemia secondary to Lynparza [250 mg BID]-currently on 200 mg BID tolerating extremely well- today Hb 13- STABLE.   # PN G-2- on Lyrica 50 mg TID-STABLE  #IV access/Mediport-stable/port flush  # DISPOSITION:  # avastin today # in 3  weeks MD;  Avastin; labs-  UA; cbc/cmp; ca-125; random protein:creatiine ratio- Dr.B

## 2021-09-13 NOTE — Patient Instructions (Signed)
[name]  Discharge Instructions: Thank you for choosing [facility] to provide your oncology and hematology care.  If you have a lab appointment with the [facility], please go directly to the [facility] and check in at the registration area.  Wear comfortable clothing and clothing appropriate for easy access to any Portacath or PICC line.   We strive to give you quality time with your provider. You may need to reschedule your appointment if you arrive late (15 or more minutes).  Arriving late affects you and other patients whose appointments are after yours.  Also, if you miss three or more appointments without notifying the office, you may be dismissed from the clinic at the provider's discretion.      For prescription refill requests, have your pharmacy contact our office and allow 72 hours for refills to be completed.    Today you received the following chemotherapy and/or immunotherapy agents: Mvasi      To help prevent nausea and vomiting after your treatment, we encourage you to take your nausea medication as directed.  BELOW ARE SYMPTOMS THAT SHOULD BE REPORTED IMMEDIATELY: *FEVER GREATER THAN 100.4 F (38 C) OR HIGHER *CHILLS OR SWEATING *NAUSEA AND VOMITING THAT IS NOT CONTROLLED WITH YOUR NAUSEA MEDICATION *UNUSUAL SHORTNESS OF BREATH *UNUSUAL BRUISING OR BLEEDING *URINARY PROBLEMS (pain or burning when urinating, or frequent urination) *BOWEL PROBLEMS (unusual diarrhea, constipation, pain near the anus) TENDERNESS IN MOUTH AND THROAT WITH OR WITHOUT PRESENCE OF ULCERS (sore throat, sores in mouth, or a toothache) UNUSUAL RASH, SWELLING OR PAIN  UNUSUAL VAGINAL DISCHARGE OR ITCHING   Items with * indicate a potential emergency and should be followed up as soon as possible or go to the Emergency Department if any problems should occur.  Please show the CHEMOTHERAPY ALERT CARD or IMMUNOTHERAPY ALERT CARD at check-in to  the Emergency Department and triage nurse.  Should you have questions after your visit or need to cancel or reschedule your appointment, please contact [facility]  [phone] and follow the prompts.  Office hours are 8:00 a.m. to 4:30 p.m. Monday - Friday. Please note that voicemails left after 4:00 p.m. may not be returned until the following business day.  We are closed weekends and major holidays. You have access to a nurse at all times for urgent questions. Please call the main number to the clinic [phone] and follow the prompts.  For any non-urgent questions, you may also contact your provider using MyChart. We now offer e-Visits for anyone 24 and older to request care online for non-urgent symptoms. For details visit [email or web address].   Also download the MyChart app! Go to the app store, search "MyChart", open the app, select [facility], and log in with your MyChart username and password.  Due to Covid, a mask is required upon entering the hospital/clinic. If you do not have a mask, one will be given to you upon arrival. For doctor visits, patients may have 1 support person aged 7 or older with them. For treatment visits, patients cannot have anyone with them due to current Covid guidelines and our immunocompromised population. Bevacizumab injection What is this medication? BEVACIZUMAB (be va SIZ yoo mab) is a monoclonal antibody. It is used to treat many types of cancer. This medicine may be used for other purposes; ask your health care provider or pharmacist if you have questions. COMMON BRAND NAME(S): Avastin, MVASI, Zirabev What should I tell my care team  before I take this medication? They need to know if you have any of these conditions: diabetes heart disease high blood pressure history of coughing up blood prior anthracycline chemotherapy (e.g., doxorubicin, daunorubicin, epirubicin) recent or ongoing radiation therapy recent or  planning to have surgery stroke an unusual or allergic reaction to bevacizumab, hamster proteins, mouse proteins, other medicines, foods, dyes, or preservatives pregnant or trying to get pregnant breast-feeding How should I use this medication? This medicine is for infusion into a vein. It is given by a health care professional in a hospital or clinic setting. Talk to your pediatrician regarding the use of this medicine in children. Special care may be needed. Overdosage: If you think you have taken too much of this medicine contact a poison control center or emergency room at once. NOTE: This medicine is only for you. Do not share this medicine with others. What if I miss a dose? It is important not to miss your dose. Call your doctor or health care professional if you are unable to keep an appointment. What may interact with this medication? Interactions are not expected. This list may not describe all possible interactions. Give your health care provider a list of all the medicines, herbs, non-prescription drugs, or dietary supplements you use. Also tell them if you smoke, drink alcohol, or use illegal drugs. Some items may interact with your medicine. What should I watch for while using this medication? Your condition will be monitored carefully while you are receiving this medicine. You will need important blood work and urine testing done while you are taking this medicine. This medicine may increase your risk to bruise or bleed. Call your doctor or health care professional if you notice any unusual bleeding. Before having surgery, talk to your health care provider to make sure it is ok. This drug can increase the risk of poor healing of your surgical site or wound. You will need to stop this drug for 28 days before surgery. After surgery, wait at least 28 days before restarting this drug. Make sure the surgical site or wound is healed enough before restarting this drug. Talk to your health  care provider if questions. Do not become pregnant while taking this medicine or for 6 months after stopping it. Women should inform their doctor if they wish to become pregnant or think they might be pregnant. There is a potential for serious side effects to an unborn child. Talk to your health care professional or pharmacist for more information. Do not breast-feed an infant while taking this medicine and for 6 months after the last dose. This medicine has caused ovarian failure in some women. This medicine may interfere with the ability to have a child. You should talk to your doctor or health care professional if you are concerned about your fertility. What side effects may I notice from receiving this medication? Side effects that you should report to your doctor or health care professional as soon as possible: allergic reactions like skin rash, itching or hives, swelling of the face, lips, or tongue chest pain or chest tightness chills coughing up blood high fever seizures severe constipation signs and symptoms of bleeding such as bloody or black, tarry stools; red or dark-brown urine; spitting up blood or brown material that looks like coffee grounds; red spots on the skin; unusual bruising or bleeding from the eye, gums, or nose signs and symptoms of a blood clot such as breathing problems; chest pain; severe, sudden headache; pain, swelling, warmth in  the leg signs and symptoms of a stroke like changes in vision; confusion; trouble speaking or understanding; severe headaches; sudden numbness or weakness of the face, arm or leg; trouble walking; dizziness; loss of balance or coordination stomach pain sweating swelling of legs or ankles vomiting weight gain Side effects that usually do not require medical attention (report to your doctor or health care professional if they continue or are bothersome): back pain changes in taste decreased appetite dry skin nausea tiredness This list  may not describe all possible side effects. Call your doctor for medical advice about side effects. You may report side effects to FDA at 1-800-FDA-1088. Where should I keep my medication? This drug is given in a hospital or clinic and will not be stored at home. NOTE: This sheet is a summary. It may not cover all possible information. If you have questions about this medicine, talk to your doctor, pharmacist, or health care provider.  2022 Elsevier/Gold Standard (2019-10-09 10:50:46)

## 2021-09-14 LAB — CA 125: Cancer Antigen (CA) 125: 5.1 U/mL (ref 0.0–38.1)

## 2021-09-16 ENCOUNTER — Inpatient Hospital Stay (HOSPITAL_BASED_OUTPATIENT_CLINIC_OR_DEPARTMENT_OTHER): Payer: Medicaid Other | Admitting: Hospice and Palliative Medicine

## 2021-09-16 DIAGNOSIS — Z515 Encounter for palliative care: Secondary | ICD-10-CM

## 2021-09-16 NOTE — Progress Notes (Signed)
Voicemail left.  Will reschedule.

## 2021-09-27 ENCOUNTER — Telehealth: Payer: Self-pay | Admitting: Nurse Practitioner

## 2021-09-27 DIAGNOSIS — M79606 Pain in leg, unspecified: Secondary | ICD-10-CM

## 2021-09-27 DIAGNOSIS — L03116 Cellulitis of left lower limb: Secondary | ICD-10-CM | POA: Diagnosis not present

## 2021-09-27 NOTE — Telephone Encounter (Signed)
Received call from patient regarding leg pain, described as red area size of palm of her hand with hardened area centrally. She has a previous history of blood clot in her upper extremity and feels that symptoms are similar. She is not currently on blood thinners. Discussed evaluation in ER vs [facility] tomorrow morning. Patient prefers to go to ER.

## 2021-10-01 ENCOUNTER — Other Ambulatory Visit (HOSPITAL_COMMUNITY): Payer: Self-pay

## 2021-10-06 ENCOUNTER — Inpatient Hospital Stay: Payer: Medicaid Other

## 2021-10-06 ENCOUNTER — Inpatient Hospital Stay: Payer: Medicaid Other | Attending: Internal Medicine

## 2021-10-06 ENCOUNTER — Inpatient Hospital Stay (HOSPITAL_BASED_OUTPATIENT_CLINIC_OR_DEPARTMENT_OTHER): Payer: Medicaid Other | Admitting: Nurse Practitioner

## 2021-10-06 ENCOUNTER — Other Ambulatory Visit: Payer: Self-pay

## 2021-10-06 ENCOUNTER — Encounter: Payer: Self-pay | Admitting: Internal Medicine

## 2021-10-06 ENCOUNTER — Inpatient Hospital Stay (HOSPITAL_BASED_OUTPATIENT_CLINIC_OR_DEPARTMENT_OTHER): Payer: Medicaid Other | Admitting: Internal Medicine

## 2021-10-06 VITALS — BP 115/72 | HR 62 | Temp 96.9°F | Resp 18

## 2021-10-06 VITALS — BP 114/77 | HR 63 | Temp 97.9°F | Resp 18 | Wt 264.3 lb

## 2021-10-06 DIAGNOSIS — E8941 Symptomatic postprocedural ovarian failure: Secondary | ICD-10-CM | POA: Diagnosis not present

## 2021-10-06 DIAGNOSIS — Z7189 Other specified counseling: Secondary | ICD-10-CM

## 2021-10-06 DIAGNOSIS — Z1509 Genetic susceptibility to other malignant neoplasm: Secondary | ICD-10-CM | POA: Diagnosis not present

## 2021-10-06 DIAGNOSIS — Z5112 Encounter for antineoplastic immunotherapy: Secondary | ICD-10-CM | POA: Insufficient documentation

## 2021-10-06 DIAGNOSIS — C482 Malignant neoplasm of peritoneum, unspecified: Secondary | ICD-10-CM

## 2021-10-06 DIAGNOSIS — Z1501 Genetic susceptibility to malignant neoplasm of breast: Secondary | ICD-10-CM | POA: Diagnosis not present

## 2021-10-06 LAB — URINALYSIS, COMPLETE (UACMP) WITH MICROSCOPIC
Bacteria, UA: NONE SEEN
Bilirubin Urine: NEGATIVE
Glucose, UA: NEGATIVE mg/dL
Hgb urine dipstick: NEGATIVE
Ketones, ur: NEGATIVE mg/dL
Leukocytes,Ua: NEGATIVE
Nitrite: NEGATIVE
Protein, ur: 30 mg/dL — AB
Specific Gravity, Urine: 1.024 (ref 1.005–1.030)
pH: 5 (ref 5.0–8.0)

## 2021-10-06 LAB — COMPREHENSIVE METABOLIC PANEL
ALT: 15 U/L (ref 0–44)
AST: 26 U/L (ref 15–41)
Albumin: 3.8 g/dL (ref 3.5–5.0)
Alkaline Phosphatase: 56 U/L (ref 38–126)
Anion gap: 9 (ref 5–15)
BUN: 11 mg/dL (ref 6–20)
CO2: 26 mmol/L (ref 22–32)
Calcium: 9.4 mg/dL (ref 8.9–10.3)
Chloride: 100 mmol/L (ref 98–111)
Creatinine, Ser: 0.91 mg/dL (ref 0.44–1.00)
GFR, Estimated: >60 mL/min (ref >60)
Glucose, Bld: 174 mg/dL — ABNORMAL HIGH (ref 70–99)
Potassium: 3.4 mmol/L — ABNORMAL LOW (ref 3.5–5.1)
Sodium: 135 mmol/L (ref 135–145)
Total Bilirubin: 0.5 mg/dL (ref 0.3–1.2)
Total Protein: 7.1 g/dL (ref 6.5–8.1)

## 2021-10-06 LAB — CBC WITH DIFFERENTIAL/PLATELET
Abs Immature Granulocytes: 0.02 10*3/uL (ref 0.00–0.07)
Basophils Absolute: 0.1 10*3/uL (ref 0.0–0.1)
Basophils Relative: 1 %
Eosinophils Absolute: 0.1 10*3/uL (ref 0.0–0.5)
Eosinophils Relative: 2 %
HCT: 41.2 % (ref 36.0–46.0)
Hemoglobin: 14.1 g/dL (ref 12.0–15.0)
Immature Granulocytes: 0 %
Lymphocytes Relative: 28 %
Lymphs Abs: 1.6 10*3/uL (ref 0.7–4.0)
MCH: 39.1 pg — ABNORMAL HIGH (ref 26.0–34.0)
MCHC: 34.2 g/dL (ref 30.0–36.0)
MCV: 114.1 fL — ABNORMAL HIGH (ref 80.0–100.0)
Monocytes Absolute: 0.3 10*3/uL (ref 0.1–1.0)
Monocytes Relative: 6 %
Neutro Abs: 3.5 10*3/uL (ref 1.7–7.7)
Neutrophils Relative %: 63 %
Platelets: 127 10*3/uL — ABNORMAL LOW (ref 150–400)
RBC: 3.61 MIL/uL — ABNORMAL LOW (ref 3.87–5.11)
RDW: 16.8 % — ABNORMAL HIGH (ref 11.5–15.5)
WBC: 5.6 10*3/uL (ref 4.0–10.5)
nRBC: 0 % (ref 0.0–0.2)

## 2021-10-06 LAB — PROTEIN / CREATININE RATIO, URINE
Creatinine, Urine: 260 mg/dL
Protein Creatinine Ratio: 0.05 mg/mg{Cre} (ref 0.00–0.15)
Total Protein, Urine: 14 mg/dL

## 2021-10-06 MED ORDER — SODIUM CHLORIDE 0.9 % IV SOLN
Freq: Once | INTRAVENOUS | Status: AC
  Filled 2021-10-06: qty 250

## 2021-10-06 MED ORDER — SODIUM CHLORIDE 0.9% FLUSH
10.0000 mL | Freq: Once | INTRAVENOUS | Status: AC
  Administered 2021-10-06: 10 mL via INTRAVENOUS
  Filled 2021-10-06: qty 10

## 2021-10-06 MED ORDER — HEPARIN SOD (PORK) LOCK FLUSH 100 UNIT/ML IV SOLN
500.0000 [IU] | Freq: Once | INTRAVENOUS | Status: AC
  Administered 2021-10-06: 500 [IU] via INTRAVENOUS
  Filled 2021-10-06: qty 5

## 2021-10-06 MED ORDER — CLONIDINE HCL 0.1 MG PO TABS: 0.1000 mg | ORAL_TABLET | Freq: Every evening | ORAL | 3 refills | Status: DC

## 2021-10-06 MED ORDER — SODIUM CHLORIDE 0.9 % IV SOLN
15.0000 mg/kg | Freq: Once | INTRAVENOUS | Status: AC
  Administered 2021-10-06: 1700 mg via INTRAVENOUS
  Filled 2021-10-06: qty 64

## 2021-10-06 MED ORDER — HEPARIN SOD (PORK) LOCK FLUSH 100 UNIT/ML IV SOLN
250.0000 [IU] | Freq: Once | INTRAVENOUS | Status: AC | PRN
  Filled 2021-10-06: qty 5

## 2021-10-06 MED ORDER — CLONIDINE 0.3 MG/24HR TD PTWK: 0.3000 mg | MEDICATED_PATCH | TRANSDERMAL | 3 refills | Status: DC

## 2021-10-06 NOTE — Progress Notes (Signed)
Patient here for pre treatment check she was recently hospitalized for cellulitis of the left leg.

## 2021-10-06 NOTE — Assessment & Plan Note (Addendum)
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 STABLE; tumor markers- improving.  Currently off bevacizumab [last infusion 02/05/2021-secondary to ventral hernia surgery]. On avastin + lynparza.   # Proceed with avastin today; CBC- platelets 127; CMP are reviewed; adequate today; no contraindications to chemotherapy.Continue Lynparza 200 mg twice a day [severe anemia while on 250 mg twice a day].   #Hypertension: Blood pressure 131/80.  Monitor closely on Avastin.  Continue metoprolol/clonidine hot flashes. STABLE.   # LEFT LE- cellulitis /sp antibiotics;   #  Severe recurrent anemia secondary to Lynparza [250 mg BID]-currently on 200 mg BID tolerating extremely well- today Hb 13- STABLE.   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  #IV access/Mediport-stable/port flush  # DISPOSITION:  # avastin today # in 3  weeks MD;  Avastin; labs-  UA; cbc/cmp; ca-125; random protein:creatiine ratio- Dr.B
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
## 2021-10-06 NOTE — Patient Instructions (Addendum)
[facility]  Discharge Instructions: Thank you for choosing [facility] to provide your oncology and hematology care.  If you have a lab appointment with the [facility], please go directly to the [facility] and check in at the registration area.  Wear comfortable clothing and clothing appropriate for easy access to any Portacath or PICC line.   We strive to give you quality time with your provider. You may need to reschedule your appointment if you arrive late (15 or more minutes).  Arriving late affects you and other patients whose appointments are after yours.  Also, if you miss three or more appointments without notifying the office, you may be dismissed from the clinic at the provider's discretion.      For prescription refill requests, have your pharmacy contact our office and allow 72 hours for refills to be completed.    Today you received the following chemotherapy and/or immunotherapy agents bevacizumab      To help prevent nausea and vomiting after your treatment, we encourage you to take your nausea medication as directed.  BELOW ARE SYMPTOMS THAT SHOULD BE REPORTED IMMEDIATELY: *FEVER GREATER THAN 100.4 F (38 C) OR HIGHER *CHILLS OR SWEATING *NAUSEA AND VOMITING THAT IS NOT CONTROLLED WITH YOUR NAUSEA MEDICATION *UNUSUAL SHORTNESS OF BREATH *UNUSUAL BRUISING OR BLEEDING *URINARY PROBLEMS (pain or burning when urinating, or frequent urination) *BOWEL PROBLEMS (unusual diarrhea, constipation, pain near the anus) TENDERNESS IN MOUTH AND THROAT WITH OR WITHOUT PRESENCE OF ULCERS (sore throat, sores in mouth, or a toothache) UNUSUAL RASH, SWELLING OR PAIN  UNUSUAL VAGINAL DISCHARGE OR ITCHING   Items with * indicate a potential emergency and should be followed up as soon as possible or go to the Emergency Department if any problems should occur.  Please show the CHEMOTHERAPY ALERT CARD or IMMUNOTHERAPY ALERT CARD at check-in  to the Emergency Department and triage nurse.  Should you have questions after your visit or need to cancel or reschedule your appointment, please contact [facility]  [phone] and follow the prompts.  Office hours are 8:00 a.m. to 4:30 p.m. Monday - Friday. Please note that voicemails left after 4:00 p.m. may not be returned until the following business day.  We are closed weekends and major holidays. You have access to a nurse at all times for urgent questions. Please call the main number to the clinic [phone] and follow the prompts.  For any non-urgent questions, you may also contact your provider using MyChart. We now offer e-Visits for anyone 28 and older to request care online for non-urgent symptoms. For details visit [email or web address].   Also download the MyChart app! Go to the app store, search "MyChart", open the app, select [facility], and log in with your MyChart username and password.  Due to Covid, a mask is required upon entering the hospital/clinic. If you do not have a mask, one will be given to you upon arrival. For doctor visits, patients may have 1 support person aged 31 or older with them. For treatment visits, patients cannot have anyone with them due to current Covid guidelines and our immunocompromised population. Bevacizumab injection What is this medication? BEVACIZUMAB (be va SIZ yoo mab) is a monoclonal antibody. It is used to treat many types of cancer. This medicine may be used for other purposes; ask your health care provider or pharmacist if you have questions. COMMON BRAND NAME(S): Avastin, MVASI, Zirabev What should I tell my care team  before I take this medication? They need to know if you have any of these conditions: diabetes heart disease high blood pressure history of coughing up blood prior anthracycline chemotherapy (e.g., doxorubicin, daunorubicin, epirubicin) recent or ongoing radiation therapy recent  or planning to have surgery stroke an unusual or allergic reaction to bevacizumab, hamster proteins, mouse proteins, other medicines, foods, dyes, or preservatives pregnant or trying to get pregnant breast-feeding How should I use this medication? This medicine is for infusion into a vein. It is given by a health care professional in a hospital or clinic setting. Talk to your pediatrician regarding the use of this medicine in children. Special care may be needed. Overdosage: If you think you have taken too much of this medicine contact a poison control center or emergency room at once. NOTE: This medicine is only for you. Do not share this medicine with others. What if I miss a dose? It is important not to miss your dose. Call your doctor or health care professional if you are unable to keep an appointment. What may interact with this medication? Interactions are not expected. This list may not describe all possible interactions. Give your health care provider a list of all the medicines, herbs, non-prescription drugs, or dietary supplements you use. Also tell them if you smoke, drink alcohol, or use illegal drugs. Some items may interact with your medicine. What should I watch for while using this medication? Your condition will be monitored carefully while you are receiving this medicine. You will need important blood work and urine testing done while you are taking this medicine. This medicine may increase your risk to bruise or bleed. Call your doctor or health care professional if you notice any unusual bleeding. Before having surgery, talk to your health care provider to make sure it is ok. This drug can increase the risk of poor healing of your surgical site or wound. You will need to stop this drug for 28 days before surgery. After surgery, wait at least 28 days before restarting this drug. Make sure the surgical site or wound is healed enough before restarting this drug. Talk to your  health care provider if questions. Do not become pregnant while taking this medicine or for 6 months after stopping it. Women should inform their doctor if they wish to become pregnant or think they might be pregnant. There is a potential for serious side effects to an unborn child. Talk to your health care professional or pharmacist for more information. Do not breast-feed an infant while taking this medicine and for 6 months after the last dose. This medicine has caused ovarian failure in some women. This medicine may interfere with the ability to have a child. You should talk to your doctor or health care professional if you are concerned about your fertility. What side effects may I notice from receiving this medication? Side effects that you should report to your doctor or health care professional as soon as possible: allergic reactions like skin rash, itching or hives, swelling of the face, lips, or tongue chest pain or chest tightness chills coughing up blood high fever seizures severe constipation signs and symptoms of bleeding such as bloody or black, tarry stools; red or dark-brown urine; spitting up blood or brown material that looks like coffee grounds; red spots on the skin; unusual bruising or bleeding from the eye, gums, or nose signs and symptoms of a blood clot such as breathing problems; chest pain; severe, sudden headache; pain, swelling, warmth in  the leg signs and symptoms of a stroke like changes in vision; confusion; trouble speaking or understanding; severe headaches; sudden numbness or weakness of the face, arm or leg; trouble walking; dizziness; loss of balance or coordination stomach pain sweating swelling of legs or ankles vomiting weight gain Side effects that usually do not require medical attention (report to your doctor or health care professional if they continue or are bothersome): back pain changes in taste decreased appetite dry  skin nausea tiredness This list may not describe all possible side effects. Call your doctor for medical advice about side effects. You may report side effects to FDA at 1-800-FDA-1088. Where should I keep my medication? This drug is given in a hospital or clinic and will not be stored at home. NOTE: This sheet is a summary. It may not cover all possible information. If you have questions about this medicine, talk to your doctor, pharmacist, or health care provider.  2022 Elsevier/Gold Standard (2019-10-09 10:50:46)

## 2021-10-07 ENCOUNTER — Encounter: Payer: Self-pay | Admitting: *Deleted

## 2021-10-07 ENCOUNTER — Other Ambulatory Visit (HOSPITAL_COMMUNITY): Payer: Self-pay

## 2021-10-07 ENCOUNTER — Encounter: Payer: Self-pay | Admitting: Internal Medicine

## 2021-10-07 LAB — CA 125: Cancer Antigen (CA) 125: 5.4 U/mL (ref 0.0–38.1)

## 2021-10-08 ENCOUNTER — Encounter: Payer: Self-pay | Admitting: *Deleted

## 2021-10-13 ENCOUNTER — Other Ambulatory Visit (HOSPITAL_COMMUNITY): Payer: Self-pay

## 2021-10-18 ENCOUNTER — Other Ambulatory Visit: Payer: Self-pay | Admitting: Hospice and Palliative Medicine

## 2021-10-18 ENCOUNTER — Inpatient Hospital Stay (HOSPITAL_BASED_OUTPATIENT_CLINIC_OR_DEPARTMENT_OTHER): Payer: Medicaid Other | Admitting: Hospice and Palliative Medicine

## 2021-10-18 DIAGNOSIS — Z515 Encounter for palliative care: Secondary | ICD-10-CM

## 2021-10-18 MED ORDER — OXYCODONE HCL 5 MG PO TABS: 2.5000 mg | ORAL_TABLET | Freq: Four times a day (QID) | ORAL | 0 refills | Status: DC | PRN

## 2021-10-18 NOTE — Progress Notes (Signed)
Was unable to reach patient for scheduled MyChart visit.  Voicemail left.  Will reschedule.

## 2021-10-25 ENCOUNTER — Telehealth: Payer: Self-pay | Admitting: Internal Medicine

## 2021-10-25 NOTE — Telephone Encounter (Signed)
Pt called in and wants to cancel appt. For 11-1 and get appt for next Tuesday. Please give her a call at [phone]

## 2021-10-25 NOTE — Telephone Encounter (Signed)
Colette- r/s pt's apts.

## 2021-10-26 ENCOUNTER — Inpatient Hospital Stay: Payer: Medicaid Other

## 2021-10-26 ENCOUNTER — Inpatient Hospital Stay: Payer: Medicaid Other | Admitting: Internal Medicine

## 2021-10-28 ENCOUNTER — Ambulatory Visit: Payer: Medicaid Other | Admitting: Internal Medicine

## 2021-10-28 ENCOUNTER — Other Ambulatory Visit: Payer: Medicaid Other

## 2021-10-28 ENCOUNTER — Ambulatory Visit: Payer: Medicaid Other

## 2021-11-02 ENCOUNTER — Inpatient Hospital Stay (HOSPITAL_BASED_OUTPATIENT_CLINIC_OR_DEPARTMENT_OTHER): Payer: Medicaid Other | Admitting: Internal Medicine

## 2021-11-02 ENCOUNTER — Other Ambulatory Visit: Payer: Self-pay

## 2021-11-02 ENCOUNTER — Inpatient Hospital Stay: Payer: Medicaid Other

## 2021-11-02 ENCOUNTER — Other Ambulatory Visit: Payer: Self-pay | Admitting: *Deleted

## 2021-11-02 ENCOUNTER — Inpatient Hospital Stay: Payer: Medicaid Other | Attending: Internal Medicine

## 2021-11-02 DIAGNOSIS — T451X5A Adverse effect of antineoplastic and immunosuppressive drugs, initial encounter: Secondary | ICD-10-CM

## 2021-11-02 DIAGNOSIS — R112 Nausea with vomiting, unspecified: Secondary | ICD-10-CM

## 2021-11-02 DIAGNOSIS — C482 Malignant neoplasm of peritoneum, unspecified: Secondary | ICD-10-CM

## 2021-11-02 DIAGNOSIS — Z23 Encounter for immunization: Secondary | ICD-10-CM | POA: Diagnosis not present

## 2021-11-02 DIAGNOSIS — Z5112 Encounter for antineoplastic immunotherapy: Secondary | ICD-10-CM | POA: Diagnosis present

## 2021-11-02 DIAGNOSIS — Z7189 Other specified counseling: Secondary | ICD-10-CM

## 2021-11-02 LAB — COMPREHENSIVE METABOLIC PANEL
ALT: 17 U/L (ref 0–44)
AST: 22 U/L (ref 15–41)
Albumin: 3.9 g/dL (ref 3.5–5.0)
Alkaline Phosphatase: 62 U/L (ref 38–126)
Anion gap: 7 (ref 5–15)
BUN: 10 mg/dL (ref 6–20)
CO2: 27 mmol/L (ref 22–32)
Calcium: 9.1 mg/dL (ref 8.9–10.3)
Chloride: 100 mmol/L (ref 98–111)
Creatinine, Ser: 0.69 mg/dL (ref 0.44–1.00)
GFR, Estimated: >60 mL/min (ref >60)
Glucose, Bld: 128 mg/dL — ABNORMAL HIGH (ref 70–99)
Potassium: 4 mmol/L (ref 3.5–5.1)
Sodium: 134 mmol/L — ABNORMAL LOW (ref 135–145)
Total Bilirubin: 0.5 mg/dL (ref 0.3–1.2)
Total Protein: 7.2 g/dL (ref 6.5–8.1)

## 2021-11-02 LAB — CBC WITH DIFFERENTIAL/PLATELET
Abs Immature Granulocytes: 0.02 10*3/uL (ref 0.00–0.07)
Basophils Absolute: 0.1 10*3/uL (ref 0.0–0.1)
Basophils Relative: 1 %
Eosinophils Absolute: 0.1 10*3/uL (ref 0.0–0.5)
Eosinophils Relative: 2 %
HCT: 39.8 % (ref 36.0–46.0)
Hemoglobin: 13.8 g/dL (ref 12.0–15.0)
Immature Granulocytes: 0 %
Lymphocytes Relative: 27 %
Lymphs Abs: 1.8 10*3/uL (ref 0.7–4.0)
MCH: 40.1 pg — ABNORMAL HIGH (ref 26.0–34.0)
MCHC: 34.7 g/dL (ref 30.0–36.0)
MCV: 115.7 fL — ABNORMAL HIGH (ref 80.0–100.0)
Monocytes Absolute: 0.5 10*3/uL (ref 0.1–1.0)
Monocytes Relative: 7 %
Neutro Abs: 4.3 10*3/uL (ref 1.7–7.7)
Neutrophils Relative %: 63 %
Platelets: 114 10*3/uL — ABNORMAL LOW (ref 150–400)
RBC: 3.44 MIL/uL — ABNORMAL LOW (ref 3.87–5.11)
RDW: 15.7 % — ABNORMAL HIGH (ref 11.5–15.5)
WBC: 6.7 10*3/uL (ref 4.0–10.5)
nRBC: 0 % (ref 0.0–0.2)

## 2021-11-02 LAB — URINALYSIS, COMPLETE (UACMP) WITH MICROSCOPIC
Bilirubin Urine: NEGATIVE
Glucose, UA: NEGATIVE mg/dL
Hgb urine dipstick: NEGATIVE
Ketones, ur: NEGATIVE mg/dL
Nitrite: NEGATIVE
Protein, ur: 30 mg/dL — AB
Specific Gravity, Urine: 1.023 (ref 1.005–1.030)
WBC, UA: NONE SEEN WBC/hpf (ref 0–5)
pH: 5 (ref 5.0–8.0)

## 2021-11-02 LAB — PROTEIN / CREATININE RATIO, URINE
Creatinine, Urine: 262 mg/dL
Protein Creatinine Ratio: 0.1 mg/mg{Cre} (ref 0.00–0.15)
Total Protein, Urine: 26 mg/dL

## 2021-11-02 MED ORDER — SODIUM CHLORIDE 0.9 % IV SOLN
Freq: Once | INTRAVENOUS | Status: AC
  Filled 2021-11-02: qty 250

## 2021-11-02 MED ORDER — ONDANSETRON 8 MG PO TBDP: 8.0000 mg | ORAL_TABLET | Freq: Three times a day (TID) | ORAL | 3 refills | Status: DC | PRN

## 2021-11-02 MED ORDER — SODIUM CHLORIDE 0.9 % IV SOLN
15.0000 mg/kg | Freq: Once | INTRAVENOUS | Status: AC
  Administered 2021-11-02: 1700 mg via INTRAVENOUS
  Filled 2021-11-02: qty 64

## 2021-11-02 MED ORDER — PROCHLORPERAZINE MALEATE 10 MG PO TABS: 10.0000 mg | ORAL_TABLET | Freq: Four times a day (QID) | ORAL | 3 refills | Status: DC | PRN

## 2021-11-02 MED ORDER — INFLUENZA VAC SPLIT QUAD 0.5 ML IM SUSY
0.5000 mL | PREFILLED_SYRINGE | Freq: Once | INTRAMUSCULAR | Status: AC
  Administered 2021-11-02: 0.5 mL via INTRAMUSCULAR

## 2021-11-02 MED ORDER — HEPARIN SOD (PORK) LOCK FLUSH 100 UNIT/ML IV SOLN
500.0000 [IU] | Freq: Once | INTRAVENOUS | Status: AC
  Administered 2021-11-02: 500 [IU] via INTRAVENOUS
  Filled 2021-11-02: qty 5

## 2021-11-02 MED ORDER — SODIUM CHLORIDE 0.9% FLUSH
10.0000 mL | Freq: Once | INTRAVENOUS | Status: AC
  Administered 2021-11-02: 10 mL via INTRAVENOUS
  Filled 2021-11-02: qty 10

## 2021-11-02 MED ORDER — ONDANSETRON 8 MG PO TBDP
8.0000 mg | ORAL_TABLET | Freq: Three times a day (TID) | ORAL | 3 refills | Status: DC | PRN
Start: 2021-11-02 — End: 2023-04-03

## 2021-11-02 NOTE — Assessment & Plan Note (Addendum)
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 STABLE; tumor markers- improving.  Currently off bevacizumab [last infusion 02/05/2021-secondary to ventral hernia surgery]. On avastin + lynparza.   # Proceed with avastin today; CBC- platelets 117; CMP are reviewed; adequate today; no contraindications to chemotherapy.Continue Lynparza 200 mg twice a day [severe anemia while on 250 mg twice a day].   #Hypertension: Blood pressure 121/74  Monitor closely on Avastin.  Continue metoprolol/clonidine hot flashes.STABLE.   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  # Nausea- continue Zofran/ compazine- refills sent.    #IV access/Mediport-stable/port flush  # DISPOSITION: mcyahrt # avastin today # in 3 weeks MD;  Avastin; labs-  UA; cbc/cmp; ca-125; random protein:creatiine ratio- Dr.B
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
## 2021-11-02 NOTE — Patient Instructions (Addendum)
[facility]COL[facility]  Discharge Instructions: Thank you for choosing [facility] to provide your oncology and hematology care.  If you have a lab appointment with the [facility], please go directly to the [facility] and check in at the registration area.  Wear comfortable clothing and clothing appropriate for easy access to any Portacath or PICC line.   We strive to give you quality time with your provider. You may need to reschedule your appointment if you arrive late (15 or more minutes).  Arriving late affects you and other patients whose appointments are after yours.  Also, if you miss three or more appointments without notifying the office, you may be dismissed from the clinic at the provider's discretion.      For prescription refill requests, have your pharmacy contact our office and allow 72 hours for refills to be completed.    Today you received the following chemotherapy and/or immunotherapy agents MVASI   and flu shot    To help prevent nausea and vomiting after your treatment, we encourage you to take your nausea medication as directed.  BELOW ARE SYMPTOMS THAT SHOULD BE REPORTED IMMEDIATELY: *FEVER GREATER THAN 100.4 F (38 C) OR HIGHER *CHILLS OR SWEATING *NAUSEA AND VOMITING THAT IS NOT CONTROLLED WITH YOUR NAUSEA MEDICATION *UNUSUAL SHORTNESS OF BREATH *UNUSUAL BRUISING OR BLEEDING *URINARY PROBLEMS (pain or burning when urinating, or frequent urination) *BOWEL PROBLEMS (unusual diarrhea, constipation, pain near the anus) TENDERNESS IN MOUTH AND THROAT WITH OR WITHOUT PRESENCE OF ULCERS (sore throat, sores in mouth, or a toothache) UNUSUAL RASH, SWELLING OR PAIN  UNUSUAL VAGINAL DISCHARGE OR ITCHING   Items with * indicate a potential emergency and should be followed up as soon as possible or go to the Emergency Department if any problems should occur.  Please show the CHEMOTHERAPY ALERT CARD or IMMUNOTHERAPY ALERT CARD at  check-in to the Emergency Department and triage nurse.  Should you have questions after your visit or need to cancel or reschedule your appointment, please contact [facility]  [phone] and follow the prompts.  Office hours are 8:00 a.m. to 4:30 p.m. Monday - Friday. Please note that voicemails left after 4:00 p.m. may not be returned until the following business day.  We are closed weekends and major holidays. You have access to a nurse at all times for urgent questions. Please call the main number to the clinic [phone] and follow the prompts.  For any non-urgent questions, you may also contact your provider using MyChart. We now offer e-Visits for anyone 26 and older to request care online for non-urgent symptoms. For details visit [email or web address].   Also download the MyChart app! Go to the app store, search "MyChart", open the app, select [facility], and log in with your MyChart username and password.  Due to Covid, a mask is required upon entering the hospital/clinic. If you do not have a mask, one will be given to you upon arrival. For doctor visits, patients may have 1 support person aged 58 or older with them. For treatment visits, patients cannot have anyone with them due to current Covid guidelines and our immunocompromised population.   Bevacizumab injection What is this medication? BEVACIZUMAB (be va SIZ yoo mab) is a monoclonal antibody. It is used to treat many types of cancer. This medicine may be used for other purposes; ask your health care provider or pharmacist if you have questions. COMMON BRAND NAME(S): Alymsys, Avastin, MVASI, Zirabev What  should I tell my care team before I take this medication? They need to know if you have any of these conditions: diabetes heart disease high blood pressure history of coughing up blood prior anthracycline chemotherapy (e.g., doxorubicin, daunorubicin, epirubicin) recent or ongoing  radiation therapy recent or planning to have surgery stroke an unusual or allergic reaction to bevacizumab, hamster proteins, mouse proteins, other medicines, foods, dyes, or preservatives pregnant or trying to get pregnant breast-feeding How should I use this medication? This medicine is for infusion into a vein. It is given by a health care professional in a hospital or clinic setting. Talk to your pediatrician regarding the use of this medicine in children. Special care may be needed. Overdosage: If you think you have taken too much of this medicine contact a poison control center or emergency room at once. NOTE: This medicine is only for you. Do not share this medicine with others. What if I miss a dose? It is important not to miss your dose. Call your doctor or health care professional if you are unable to keep an appointment. What may interact with this medication? Interactions are not expected. This list may not describe all possible interactions. Give your health care provider a list of all the medicines, herbs, non-prescription drugs, or dietary supplements you use. Also tell them if you smoke, drink alcohol, or use illegal drugs. Some items may interact with your medicine. What should I watch for while using this medication? Your condition will be monitored carefully while you are receiving this medicine. You will need important blood work and urine testing done while you are taking this medicine. This medicine may increase your risk to bruise or bleed. Call your doctor or health care professional if you notice any unusual bleeding. Before having surgery, talk to your health care provider to make sure it is ok. This drug can increase the risk of poor healing of your surgical site or wound. You will need to stop this drug for 28 days before surgery. After surgery, wait at least 28 days before restarting this drug. Make sure the surgical site or wound is healed enough before restarting  this drug. Talk to your health care provider if questions. Do not become pregnant while taking this medicine or for 6 months after stopping it. Women should inform their doctor if they wish to become pregnant or think they might be pregnant. There is a potential for serious side effects to an unborn child. Talk to your health care professional or pharmacist for more information. Do not breast-feed an infant while taking this medicine and for 6 months after the last dose. This medicine has caused ovarian failure in some women. This medicine may interfere with the ability to have a child. You should talk to your doctor or health care professional if you are concerned about your fertility. What side effects may I notice from receiving this medication? Side effects that you should report to your doctor or health care professional as soon as possible: allergic reactions like skin rash, itching or hives, swelling of the face, lips, or tongue chest pain or chest tightness chills coughing up blood high fever seizures severe constipation signs and symptoms of bleeding such as bloody or black, tarry stools; red or dark-brown urine; spitting up blood or brown material that looks like coffee grounds; red spots on the skin; unusual bruising or bleeding from the eye, gums, or nose signs and symptoms of a blood clot such as breathing problems; chest pain; severe,  sudden headache; pain, swelling, warmth in the leg signs and symptoms of a stroke like changes in vision; confusion; trouble speaking or understanding; severe headaches; sudden numbness or weakness of the face, arm or leg; trouble walking; dizziness; loss of balance or coordination stomach pain sweating swelling of legs or ankles vomiting weight gain Side effects that usually do not require medical attention (report to your doctor or health care professional if they continue or are bothersome): back pain changes in taste decreased appetite dry  skin nausea tiredness This list may not describe all possible side effects. Call your doctor for medical advice about side effects. You may report side effects to FDA at 1-800-FDA-1088. Where should I keep my medication? This drug is given in a hospital or clinic and will not be stored at home. NOTE: This sheet is a summary. It may not cover all possible information. If you have questions about this medicine, talk to your doctor, pharmacist, or health care provider.  2022 Elsevier/Gold Standard (2021-08-31 00:00:00)  Influenza (Flu) Vaccine (Inactivated or Recombinant): What You Need to Know 1. Why get vaccinated? Influenza vaccine can prevent influenza (flu). Flu is a contagious disease that spreads around the [place] every year, usually between October and May. Anyone can get the flu, but it is more dangerous for some people. Infants and young children, people 67 years and older, pregnant people, and people with certain health conditions or a weakened immune system are at greatest risk of flu complications. Pneumonia, bronchitis, sinus infections, and ear infections are examples of flu-related complications. If you have a medical condition, such as heart disease, cancer, or diabetes, flu can make it worse. Flu can cause fever and chills, sore throat, muscle aches, fatigue, cough, headache, and runny or stuffy nose. Some people may have vomiting and diarrhea, though this is more common in children than adults. In an average year, thousands of people in the [place] die from flu, and many more are hospitalized. Flu vaccine prevents millions of illnesses and flu-related visits to the doctor each year. 2. Influenza vaccines CDC recommends everyone 6 months and older get vaccinated every flu season. Children 6 months through 30 years of age may need 2 doses during a single flu season. Everyone else needs only 1 dose each flu season. It takes about 2 weeks for protection to develop after  vaccination. There are many flu viruses, and they are always changing. Each year a new flu vaccine is made to protect against the influenza viruses believed to be likely to cause disease in the upcoming flu season. Even when the vaccine doesn't exactly match these viruses, it may still provide some protection. Influenza vaccine does not cause flu. Influenza vaccine may be given at the same time as other vaccines. 3. Talk with your health care provider Tell your vaccination provider if the person getting the vaccine: Has had an allergic reaction after a previous dose of influenza vaccine, or has any severe, life-threatening allergies Has ever had Guillain-Barr Syndrome (also called "GBS") In some cases, your health care provider may decide to postpone influenza vaccination until a future visit. Influenza vaccine can be administered at any time during pregnancy. People who are or will be pregnant during influenza season should receive inactivated influenza vaccine. People with minor illnesses, such as a cold, may be vaccinated. People who are moderately or severely ill should usually wait until they recover before getting influenza vaccine. Your health care provider can give you more information. 4. Risks of a  vaccine reaction Soreness, redness, and swelling where the shot is given, fever, muscle aches, and headache can happen after influenza vaccination. There may be a very small increased risk of Guillain-Barr Syndrome (GBS) after inactivated influenza vaccine (the flu shot). Young children who get the flu shot along with pneumococcal vaccine (PCV13) and/or DTaP vaccine at the same time might be slightly more likely to have a seizure caused by fever. Tell your health care provider if a child who is getting flu vaccine has ever had a seizure. People sometimes faint after medical procedures, including vaccination. Tell your provider if you feel dizzy or have vision changes or ringing in the ears. As  with any medicine, there is a very remote chance of a vaccine causing a severe allergic reaction, other serious injury, or death. 5. What if there is a serious problem? An allergic reaction could occur after the vaccinated person leaves the clinic. If you see signs of a severe allergic reaction (hives, swelling of the face and throat, difficulty breathing, a fast heartbeat, dizziness, or weakness), call 9-1-1 and get the person to the nearest hospital. For other signs that concern you, call your health care provider. Adverse reactions should be reported to the Vaccine Adverse Event Reporting System (VAERS). Your health care provider will usually file this report, or you can do it yourself. Visit the VAERS website at www.vaers.SamedayNews.es or call 306-579-4419. VAERS is only for reporting reactions, and VAERS staff members do not give medical advice. 6. The National Vaccine Injury Compensation Program The [employer] Vaccine Injury Compensation Program (VICP) is a federal program that was created to compensate people who may have been injured by certain vaccines. Claims regarding alleged injury or death due to vaccination have a time limit for filing, which may be as short as two years. Visit the VICP website at GoldCloset.com.ee or call (717) 117-7550 to learn about the program and about filing a claim. 7. How can I learn more? Ask your health care provider. Call your local or state health department. Visit the website of the Food and Drug Administration (FDA) for vaccine package inserts and additional information at TraderRating.uy. Contact the Centers for Disease Control and Prevention (CDC): Call 6047716051 (1-800-CDC-INFO) or Visit CDC's website at https://gibson.com/. Vaccine Information Statement Inactivated Influenza Vaccine (07/31/2020) This information is not intended to replace advice given to you by your health care provider. Make sure you discuss any  questions you have with your health care provider. Document Revised: 09/02/2021 Document Reviewed: 09/02/2021 Elsevier Patient Education  2022 [employer].

## 2021-11-03 LAB — CA 125: Cancer Antigen (CA) 125: 5.6 U/mL (ref 0.0–38.1)

## 2021-11-04 ENCOUNTER — Other Ambulatory Visit (HOSPITAL_COMMUNITY): Payer: Self-pay

## 2021-11-10 ENCOUNTER — Other Ambulatory Visit (HOSPITAL_COMMUNITY): Payer: Self-pay

## 2021-11-15 ENCOUNTER — Other Ambulatory Visit (HOSPITAL_COMMUNITY): Payer: Self-pay

## 2021-11-16 ENCOUNTER — Other Ambulatory Visit (HOSPITAL_COMMUNITY): Payer: Self-pay

## 2021-11-17 ENCOUNTER — Other Ambulatory Visit (HOSPITAL_COMMUNITY): Payer: Self-pay

## 2021-11-21 ENCOUNTER — Other Ambulatory Visit: Payer: Self-pay | Admitting: Hospice and Palliative Medicine

## 2021-11-22 ENCOUNTER — Other Ambulatory Visit (HOSPITAL_COMMUNITY): Payer: Self-pay

## 2021-11-22 ENCOUNTER — Encounter: Payer: Self-pay | Admitting: Internal Medicine

## 2021-11-22 MED ORDER — OXYCODONE HCL 5 MG PO TABS: 2.5000 mg | ORAL_TABLET | Freq: Four times a day (QID) | ORAL | 0 refills | Status: DC | PRN

## 2021-11-23 ENCOUNTER — Other Ambulatory Visit: Payer: Self-pay | Admitting: Internal Medicine

## 2021-11-23 ENCOUNTER — Other Ambulatory Visit (HOSPITAL_COMMUNITY): Payer: Self-pay

## 2021-11-23 DIAGNOSIS — C482 Malignant neoplasm of peritoneum, unspecified: Secondary | ICD-10-CM

## 2021-11-24 ENCOUNTER — Inpatient Hospital Stay: Payer: Medicaid Other

## 2021-11-24 ENCOUNTER — Encounter: Payer: Self-pay | Admitting: Internal Medicine

## 2021-11-24 ENCOUNTER — Inpatient Hospital Stay (HOSPITAL_BASED_OUTPATIENT_CLINIC_OR_DEPARTMENT_OTHER): Payer: Medicaid Other | Admitting: Internal Medicine

## 2021-11-24 ENCOUNTER — Other Ambulatory Visit (HOSPITAL_COMMUNITY): Payer: Self-pay

## 2021-11-24 ENCOUNTER — Other Ambulatory Visit: Payer: Self-pay

## 2021-11-24 DIAGNOSIS — C482 Malignant neoplasm of peritoneum, unspecified: Secondary | ICD-10-CM

## 2021-11-24 DIAGNOSIS — Z7189 Other specified counseling: Secondary | ICD-10-CM

## 2021-11-24 LAB — URINALYSIS, COMPLETE (UACMP) WITH MICROSCOPIC
Bacteria, UA: NONE SEEN
Glucose, UA: NEGATIVE mg/dL
Hgb urine dipstick: NEGATIVE
Leukocytes,Ua: NEGATIVE
Nitrite: NEGATIVE
Protein, ur: 30 mg/dL — AB
Specific Gravity, Urine: >1.03 — ABNORMAL HIGH (ref 1.005–1.030)
pH: 5.5 (ref 5.0–8.0)

## 2021-11-24 LAB — COMPREHENSIVE METABOLIC PANEL
ALT: 15 U/L (ref 0–44)
AST: 20 U/L (ref 15–41)
Albumin: 4 g/dL (ref 3.5–5.0)
Alkaline Phosphatase: 60 U/L (ref 38–126)
Anion gap: 9 (ref 5–15)
BUN: 11 mg/dL (ref 6–20)
CO2: 25 mmol/L (ref 22–32)
Calcium: 9.4 mg/dL (ref 8.9–10.3)
Chloride: 102 mmol/L (ref 98–111)
Creatinine, Ser: 0.67 mg/dL (ref 0.44–1.00)
GFR, Estimated: >60 mL/min (ref >60)
Glucose, Bld: 113 mg/dL — ABNORMAL HIGH (ref 70–99)
Potassium: 4 mmol/L (ref 3.5–5.1)
Sodium: 136 mmol/L (ref 135–145)
Total Bilirubin: 0.4 mg/dL (ref 0.3–1.2)
Total Protein: 7.3 g/dL (ref 6.5–8.1)

## 2021-11-24 LAB — CBC WITH DIFFERENTIAL/PLATELET
Abs Immature Granulocytes: 0.02 10*3/uL (ref 0.00–0.07)
Basophils Absolute: 0.1 10*3/uL (ref 0.0–0.1)
Basophils Relative: 1 %
Eosinophils Absolute: 0.1 10*3/uL (ref 0.0–0.5)
Eosinophils Relative: 2 %
HCT: 40 % (ref 36.0–46.0)
Hemoglobin: 13.4 g/dL (ref 12.0–15.0)
Immature Granulocytes: 0 %
Lymphocytes Relative: 30 %
Lymphs Abs: 1.8 10*3/uL (ref 0.7–4.0)
MCH: 39.4 pg — ABNORMAL HIGH (ref 26.0–34.0)
MCHC: 33.5 g/dL (ref 30.0–36.0)
MCV: 117.6 fL — ABNORMAL HIGH (ref 80.0–100.0)
Monocytes Absolute: 0.5 10*3/uL (ref 0.1–1.0)
Monocytes Relative: 9 %
Neutro Abs: 3.4 10*3/uL (ref 1.7–7.7)
Neutrophils Relative %: 58 %
Platelets: 172 10*3/uL (ref 150–400)
RBC: 3.4 MIL/uL — ABNORMAL LOW (ref 3.87–5.11)
RDW: 15.7 % — ABNORMAL HIGH (ref 11.5–15.5)
WBC: 5.9 10*3/uL (ref 4.0–10.5)
nRBC: 0 % (ref 0.0–0.2)

## 2021-11-24 LAB — PROTEIN / CREATININE RATIO, URINE
Creatinine, Urine: 276 mg/dL
Protein Creatinine Ratio: 0.11 mg/mg{Cre} (ref 0.00–0.15)
Total Protein, Urine: 30 mg/dL

## 2021-11-24 MED ORDER — OLAPARIB 100 MG PO TABS
200.0000 mg | ORAL_TABLET | Freq: Two times a day (BID) | ORAL | 3 refills | Status: DC
  Filled 2021-11-24: qty 120, 30d supply, fill #0
  Filled 2021-12-30: qty 120, 30d supply, fill #1
  Filled 2022-02-02: qty 120, 30d supply, fill #2
  Filled 2022-03-14: qty 120, 30d supply, fill #3

## 2021-11-24 MED ORDER — SODIUM CHLORIDE 0.9 % IV SOLN
Freq: Once | INTRAVENOUS | Status: AC
  Filled 2021-11-24: qty 250

## 2021-11-24 MED ORDER — SODIUM CHLORIDE 0.9 % IV SOLN
15.0000 mg/kg | Freq: Once | INTRAVENOUS | Status: AC
  Administered 2021-11-24: 1700 mg via INTRAVENOUS
  Filled 2021-11-24: qty 64

## 2021-11-24 MED ORDER — SODIUM CHLORIDE 0.9% FLUSH
10.0000 mL | Freq: Once | INTRAVENOUS | Status: AC
  Administered 2021-11-24: 10 mL via INTRAVENOUS
  Filled 2021-11-24: qty 10

## 2021-11-24 MED ORDER — HEPARIN SOD (PORK) LOCK FLUSH 100 UNIT/ML IV SOLN
500.0000 [IU] | Freq: Once | INTRAVENOUS | Status: AC | PRN
  Administered 2021-11-24: 500 [IU]
  Filled 2021-11-24: qty 5

## 2021-11-24 NOTE — Patient Instructions (Signed)
[facility] ONCOLOGY  Discharge Instructions: Thank you for choosing [facility] to provide your oncology and hematology care.  If you have a lab appointment with the [facility], please go directly to the [facility] and check in at the registration area.  Wear comfortable clothing and clothing appropriate for easy access to any Portacath or PICC line.   We strive to give you quality time with your provider. You may need to reschedule your appointment if you arrive late (15 or more minutes).  Arriving late affects you and other patients whose appointments are after yours.  Also, if you miss three or more appointments without notifying the office, you may be dismissed from the clinic at the provider's discretion.      For prescription refill requests, have your pharmacy contact our office and allow 72 hours for refills to be completed.    Today you received the following chemotherapy and/or immunotherapy agents avastin    To help prevent nausea and vomiting after your treatment, we encourage you to take your nausea medication as directed.  BELOW ARE SYMPTOMS THAT SHOULD BE REPORTED IMMEDIATELY: *FEVER GREATER THAN 100.4 F (38 C) OR HIGHER *CHILLS OR SWEATING *NAUSEA AND VOMITING THAT IS NOT CONTROLLED WITH YOUR NAUSEA MEDICATION *UNUSUAL SHORTNESS OF BREATH *UNUSUAL BRUISING OR BLEEDING *URINARY PROBLEMS (pain or burning when urinating, or frequent urination) *BOWEL PROBLEMS (unusual diarrhea, constipation, pain near the anus) TENDERNESS IN MOUTH AND THROAT WITH OR WITHOUT PRESENCE OF ULCERS (sore throat, sores in mouth, or a toothache) UNUSUAL RASH, SWELLING OR PAIN  UNUSUAL VAGINAL DISCHARGE OR ITCHING   Items with * indicate a potential emergency and should be followed up as soon as possible or go to the Emergency Department if any problems should occur.  Please show the CHEMOTHERAPY ALERT CARD or IMMUNOTHERAPY ALERT CARD at check-in to  the Emergency Department and triage nurse.  Should you have questions after your visit or need to cancel or reschedule your appointment, please contact [facility]  [phone] and follow the prompts.  Office hours are 8:00 a.m. to 4:30 p.m. Monday - Friday. Please note that voicemails left after 4:00 p.m. may not be returned until the following business day.  We are closed weekends and major holidays. You have access to a nurse at all times for urgent questions. Please call the main number to the clinic [phone] and follow the prompts.  For any non-urgent questions, you may also contact your provider using MyChart. We now offer e-Visits for anyone 81 and older to request care online for non-urgent symptoms. For details visit [email or web address].   Also download the MyChart app! Go to the app store, search "MyChart", open the app, select [facility], and log in with your MyChart username and password.  Due to Covid, a mask is required upon entering the hospital/clinic. If you do not have a mask, one will be given to you upon arrival. For doctor visits, patients may have 1 support person aged 72 or older with them. For treatment visits, patients cannot have anyone with them due to current Covid guidelines and our immunocompromised population.
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
## 2021-11-24 NOTE — Assessment & Plan Note (Addendum)
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 STABLE; tumor markers- improving.  Currently off bevacizumab [last infusion 02/05/2021-secondary to ventral hernia surgery]. On avastin + lynparza-STABLE.    # Proceed with avastin today; CBC- platelets 117; CMP are reviewed; adequate today; no contraindications to chemotherapy. Continue Lynparza 200 mg twice a day [severe anemia while on 250 mg twice a day].   #Hypertension: Blood pressure 121/74  Monitor closely on Avastin.  Continue metoprolol/clonidine hot flashes.STABLE.   #  Severe recurrent anemia secondary to Lynparza [250 mg BID]-currently on 200 mg BID tolerating extremely well- today Hb 13- STABLE.   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   # Nausea- continue Zofran/ compazine- STABLE.   #IV access/Mediport-stable/port flush  # DISPOSITION:  # avastin today # in 3 weeks NP/Tuesday afternoon;  Avastin; labs-  UA; cbc/cmp; ca-125; random protein:creatiine ratio- Dr.B

## 2021-11-24 NOTE — Progress Notes (Signed)
Per MD okay to use last urine protein result on 11/02/21 for treatment today.   Alphia Behanna [employer]

## 2021-11-24 NOTE — Progress Notes (Signed)
Patient reports she does have occasional nausea and the prescribed medications do help.  Taking Miralax and Sennokot for constipation.  She had to take milk of magnesia last week.

## 2021-11-25 ENCOUNTER — Other Ambulatory Visit (HOSPITAL_COMMUNITY): Payer: Self-pay

## 2021-11-25 LAB — CA 125: Cancer Antigen (CA) 125: 5.6 U/mL (ref 0.0–38.1)

## 2021-11-30 ENCOUNTER — Ambulatory Visit: Payer: Medicaid Other | Admitting: Family Medicine

## 2021-12-03 ENCOUNTER — Ambulatory Visit: Payer: Self-pay | Admitting: *Deleted

## 2021-12-03 ENCOUNTER — Telehealth: Payer: Self-pay | Admitting: Family Medicine

## 2021-12-03 NOTE — Telephone Encounter (Signed)
Patient returned call to report cough with yellow sputum at times and pain in chest with coughing. Left ear discomfort no drainage, muffled sound. Sore throat with pain swallowing x 4 days. Denies fever, difficulty breathing.. wheezing reported at nights when laying down. Night sweats reported not sure if from menopausal issues. Requesting medication / prednisone , antibiotic if possible. Hx stage IV CA immunocompromised. Taking chemo treatments. No available appt until 12/08/21. Did not schedule. Recommended My Chart E visit or UC . Care advise given. Patient verbalized understanding of care advise and to call back or go to [facility] or ED if symptoms worsen.        Reason for Disposition  [1] SEVERE sore throat AND [2] present > 24 hours  Answer Assessment - Initial Assessment Questions 1. ONSET: "When did the nasal discharge start?"      Na  2. AMOUNT: "How much discharge is there?"      na 3. COUGH: "Do you have a cough?" If yes, ask: "Describe the color of your sputum" (clear, white, yellow, green)     Yes cough up yellow sputum at times  4. RESPIRATORY DISTRESS: "Describe your breathing."      Normal  5. FEVER: "Do you have a fever?" If Yes, ask: "What is your temperature, how was it measured, and when did it start?"     no 6. SEVERITY: "Overall, how bad are you feeling right now?" (e.g., doesn't interfere with normal activities, staying home from school/work, staying in bed)      Cough, left ear discomfort sore throat  7. OTHER SYMPTOMS: "Do you have any other symptoms?" (e.g., sore throat, earache, wheezing, vomiting)     Sore throat, ear discomfort, cough, wheezing noted at night when laying down. Hx immunocompromised CA receiving chemo. Night sweats  8. PREGNANCY: "Is there any chance you are pregnant?" "When was your last menstrual period?"    na  Protocols used: Common Cold-A-AH

## 2021-12-03 NOTE — Telephone Encounter (Signed)
Pt needs Rx sent to new pharmacy instead of one selected per previous triage note. Pt needs Rx sent to [facility] in [place]. She is requesting this for whatever Rx she is requesting to receive today -if anything  [id number] - [address], [address]  [address] [place] [address]  Phone: [phone] Fax: [phone]

## 2021-12-03 NOTE — Telephone Encounter (Signed)
C/o cold/ flu sx cough, left ear discomfort, sore throat x 4 days. Requesting medication .   Called patient to review symptoms , no answer, left message on voicemail to call clinic back [phone].

## 2021-12-04 ENCOUNTER — Other Ambulatory Visit: Payer: Self-pay | Admitting: Emergency Medicine

## 2021-12-04 ENCOUNTER — Telehealth: Payer: Medicaid Other | Admitting: Emergency Medicine

## 2021-12-04 DIAGNOSIS — J208 Acute bronchitis due to other specified organisms: Secondary | ICD-10-CM | POA: Diagnosis not present

## 2021-12-04 DIAGNOSIS — B9689 Other specified bacterial agents as the cause of diseases classified elsewhere: Secondary | ICD-10-CM | POA: Diagnosis not present

## 2021-12-04 MED ORDER — PREDNISONE 10 MG (21) PO TBPK: ORAL_TABLET | Freq: Every day | ORAL | 0 refills | Status: DC

## 2021-12-04 MED ORDER — BENZONATATE 100 MG PO CAPS: 100.0000 mg | ORAL_CAPSULE | Freq: Two times a day (BID) | ORAL | 0 refills | Status: DC | PRN

## 2021-12-04 MED ORDER — AZITHROMYCIN 250 MG PO TABS: ORAL_TABLET | ORAL | 0 refills | Status: AC

## 2021-12-04 MED ORDER — PROMETHAZINE-DM 6.25-15 MG/5ML PO SYRP: 5.0000 mL | ORAL_SOLUTION | Freq: Four times a day (QID) | ORAL | 0 refills | Status: DC | PRN

## 2021-12-04 NOTE — Patient Instructions (Signed)
[name], thank you for joining [name], PA-C for today's virtual visit.  While this provider is not your primary care provider (PCP), if your PCP is located in our provider database this encounter information will be shared with them immediately following your visit.  Consent: (Patient) [name] provided verbal consent for this virtual visit at the beginning of the encounter.  Current Medications:  Current Outpatient Medications:    azithromycin (ZITHROMAX) 250 MG tablet, Take 2 tablets on day 1, then 1 tablet daily on days 2 through 5, Disp: 6 tablet, Rfl: 0   benzonatate (TESSALON) 100 MG capsule, Take 1 capsule (100 mg total) by mouth 2 (two) times daily as needed for cough., Disp: 20 capsule, Rfl: 0   predniSONE (STERAPRED UNI-PAK 21 TAB) 10 MG (21) TBPK tablet, Take by mouth daily. Take 6 tabs by mouth daily  for 2 days, then 5 tabs for 2 days, then 4 tabs for 2 days, then 3 tabs for 2 days, 2 tabs for 2 days, then 1 tab by mouth daily for 2 days, Disp: 42 tablet, Rfl: 0   cloNIDine (CATAPRES - DOSED IN MG/24 HR) 0.3 mg/24hr patch, Place 1 patch (0.3 mg total) onto the skin once a week., Disp: 12 patch, Rfl: 3   cloNIDine (CATAPRES) 0.1 MG tablet, Take 1 tablet (0.1 mg total) by mouth at bedtime., Disp: 90 tablet, Rfl: 3   DULoxetine (CYMBALTA) 60 MG capsule, Take 2 capsules (120 mg total) by mouth daily., Disp: 180 capsule, Rfl: 1   eszopiclone (LUNESTA) 2 MG TABS tablet, Take 1 tablet (2 mg total) by mouth at bedtime as needed for sleep. Take immediately before bedtime, Disp: 30 tablet, Rfl: 2   hydrOXYzine (ATARAX/VISTARIL) 10 MG tablet, Take 1 tablet (10 mg total) by mouth 3 (three) times daily as needed., Disp: 30 tablet, Rfl: 0   lidocaine-prilocaine (EMLA) cream, Apply 1 application topically as needed. (Patient taking differently: Apply 1 application topically as needed [facility]).), Disp: 30 g, Rfl: 3   loratadine (CLARITIN) 10 MG tablet, TAKE 1 TABLET(10 MG) BY  MOUTH EVERY MORNING, Disp: 90 tablet, Rfl: 3   LORazepam (ATIVAN) 0.5 MG tablet, Take 1 tablet (0.5 mg total) by mouth every 8 (eight) hours as needed for anxiety., Disp: 60 tablet, Rfl: 0   Melatonin 10 MG SUBL, Take 1 tablet by mouth at bedtime. (Patient not taking: Reported on 11/24/2021), Disp: , Rfl:    metoprolol succinate (TOPROL-XL) 25 MG 24 hr tablet, Take 0.5 tablets (12.5 mg total) by mouth daily., Disp: 45 tablet, Rfl: 1   Multiple Vitamin (MULTIVITAMIN WITH MINERALS) TABS tablet, Take 1 tablet by mouth daily., Disp: , Rfl:    olaparib (LYNPARZA) 100 MG tablet, Take 2 tablets (200 mg total) by mouth 2 (two) times daily. Swallow whole. May take with food to decrease nausea and vomiting., Disp: 120 tablet, Rfl: 3   omeprazole (PRILOSEC) 20 MG capsule, TAKE 1 CAPSULE(20 MG) BY MOUTH DAILY, Disp: 90 capsule, Rfl: 1   ondansetron (ZOFRAN) 8 MG tablet, One pill every 8 hours as needed for nausea/vomitting., Disp: 40 tablet, Rfl: 1   ondansetron (ZOFRAN-ODT) 8 MG disintegrating tablet, Take 1 tablet (8 mg total) by mouth every 8 (eight) hours as needed for nausea or vomiting., Disp: 30 tablet, Rfl: 3   ondansetron (ZOFRAN-ODT) 8 MG disintegrating tablet, Take 1 tablet (8 mg total) by mouth every 8 (eight) hours as needed for nausea or vomiting., Disp: 30 tablet, Rfl: 3  oxyCODONE (OXY IR/ROXICODONE) 5 MG immediate release tablet, Take 0.5-1 tablets (2.5-5 mg total) by mouth every 6 (six) hours as needed for severe pain., Disp: 60 tablet, Rfl: 0   polyethylene glycol powder (GLYCOLAX/MIRALAX) 17 GM/SCOOP powder, Take 0.5 Containers by mouth daily as needed for mild constipation or moderate constipation., Disp: , Rfl:    pregabalin (LYRICA) 75 MG capsule, Take 1 capsule (75 mg total) by mouth 3 (three) times daily., Disp: 270 capsule, Rfl: 1   prochlorperazine (COMPAZINE) 10 MG tablet, Take 1 tablet (10 mg total) by mouth every 6 (six) hours as needed for nausea or vomiting., Disp: 40 tablet, Rfl:  3   prochlorperazine (COMPAZINE) 10 MG tablet, Take 1 tablet (10 mg total) by mouth every 6 (six) hours as needed for nausea or vomiting., Disp: 40 tablet, Rfl: 3   senna (SENOKOT) 8.6 MG tablet, Take 1 tablet by mouth daily as needed for constipation., Disp: , Rfl:    SUMAtriptan (IMITREX) 100 MG tablet, Take 1 tablet (100 mg total) by mouth every 2 (two) hours as needed for migraine. May repeat in 2 hours if headache persists or recurs., Disp: 10 tablet, Rfl: 0   valACYclovir (VALTREX) 1000 MG tablet, TAKE 1 TABLET(1000 MG) BY MOUTH TWICE DAILY AS NEEDED FOR OUTBREAK, Disp: 6 tablet, Rfl: 0 No current facility-administered medications for this visit.  Facility-Administered Medications Ordered in Other Visits:    heparin lock flush 100 unit/mL, 250 Units, Intracatheter, Once PRN, Charlaine Dalton R, MD   sodium chloride flush (NS) 0.9 % injection 10 mL, 10 mL, Intravenous, Once, Cammie Sickle, MD   Medications ordered in this encounter:  Meds ordered this encounter  Medications   azithromycin (ZITHROMAX) 250 MG tablet    Sig: Take 2 tablets on day 1, then 1 tablet daily on days 2 through 5    Dispense:  6 tablet    Refill:  0    Order Specific Question:   Supervising Provider    Answer:   Sabra Heck, BRIAN [[id number]]   predniSONE (STERAPRED UNI-PAK 21 TAB) 10 MG (21) TBPK tablet    Sig: Take by mouth daily. Take 6 tabs by mouth daily  for 2 days, then 5 tabs for 2 days, then 4 tabs for 2 days, then 3 tabs for 2 days, 2 tabs for 2 days, then 1 tab by mouth daily for 2 days    Dispense:  42 tablet    Refill:  0    Order Specific Question:   Supervising Provider    Answer:   Sabra Heck, BRIAN [[id number]]   benzonatate (TESSALON) 100 MG capsule    Sig: Take 1 capsule (100 mg total) by mouth 2 (two) times daily as needed for cough.    Dispense:  20 capsule    Refill:  0    Order Specific Question:   Supervising Provider    Answer:   Sabra Heck, [place]     *If you need refills on other  medications prior to your next appointment, please contact your pharmacy*  Follow-Up: Call back or seek an in-person evaluation if the symptoms worsen or if the condition fails to improve as anticipated.  Other Instructions Get plenty of rest and push fluids Use zyrtec for nasal congestion, runny nose, and/or sore throat Use flonase for nasal congestion and runny nose Use medications daily for symptom relief Prescribed prednisone wheezing and congestion Prescribed azithromycin for bronchitis Tessalon perles for cough Use OTC medications like ibuprofen or tylenol as needed fever  or pain Follow up with PCP as needed Follow up in person at urgent care or go to the ED if you have any new or worsening symptoms such as fever, worsening cough, shortness of breath, chest tightness, chest pain, turning blue, changes in mental status, etc...     If you have been instructed to have an in-person evaluation today at a local Urgent Care facility, please use the link below. It will take you to a list of all of our available [facility], including address, phone number and hours of operation. Please do not delay care.  [facility]  If you or a family member do not have a primary care provider, use the link below to schedule a visit and establish care. When you choose a [facility] primary care physician or advanced practice provider, you gain a long-term partner in health. Find a Primary Care Provider  Learn more about [facility]'s in-office and virtual care options: [facility]

## 2021-12-04 NOTE — Progress Notes (Signed)
Patient insurance does not cover tessalon perles.  Promethazine cough syrup sent in for patient

## 2021-12-04 NOTE — Progress Notes (Signed)
Virtual Visit Consent   [name], you are scheduled for a virtual visit with a [facility] provider today.     Just as with appointments in the office, your consent must be obtained to participate.  Your consent will be active for this visit and any virtual visit you may have with one of our providers in the next 365 days.     If you have a MyChart account, a copy of this consent can be sent to you electronically.  All virtual visits are billed to your insurance company just like a traditional visit in the office.    As this is a virtual visit, video technology does not allow for your provider to perform a traditional examination.  This may limit your provider's ability to fully assess your condition.  If your provider identifies any concerns that need to be evaluated in person or the need to arrange testing (such as labs, EKG, etc.), we will make arrangements to do so.     Although advances in technology are sophisticated, we cannot ensure that it will always work on either your end or our end.  If the connection with a video visit is poor, the visit may have to be switched to a telephone visit.  With either a video or telephone visit, we are not always able to ensure that we have a secure connection.     I need to obtain your verbal consent now.   Are you willing to proceed with your visit today? Yes   [name] has provided verbal consent on 12/04/2021 for a virtual visit (video or telephone).   [name], [place]   Date: 12/04/2021 9:14 AM   Virtual Visit via Video Note   I, [name], connected with  [name]  ([id number], 21-Mar-1971) on 12/04/21 at  9:30 AM EST by a video-enabled telemedicine application and verified that I am speaking with the correct person using two identifiers.  Location: Patient: Virtual Visit Location Patient: Home Provider: Virtual Visit Location Provider: Home Office   I discussed the limitations of evaluation and management by  telemedicine and the availability of in person appointments. The patient expressed understanding and agreed to proceed.    History of Present Illness: [name] is a 50 y.o. who identifies as a female who was assigned female at birth, and is being seen today for sore throat, productive cough, sinus congestion, and wheezing x 3-4 days ago.  Admits to possible sick exposure while attending father's funeral last week.  Has tried OTC medications without relief.  Denies aggravating factors.  Reports previous symptoms in the past.   Denies fever, chills, SOB, chest pain, nausea, changes in bowel or bladder habits.    ROS: As per HPI.  All other pertinent ROS negative.     Does admit to tobacco use.    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   Fever blister 05/17/2018   Family history of breast cancer 05/08/2018   Family history of colon cancer in mother  05/08/2018   Migraine with aura and without status migrainosus 04/18/2018   Dysmenorrhea 06/21/2017   Menorrhagia 06/21/2017   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 Take 6 tabs by mouth daily  for 2 days, then 5 tabs for 2 days, then 4 tabs for 2 days, then 3 tabs for 2 days, 2 tabs for 2 days, then 1 tab by mouth daily for 2 days, Disp: 42 tablet, Rfl: 0   cloNIDine (CATAPRES - DOSED IN MG/24 HR) 0.3 mg/24hr patch, Place 1 patch (0.3 mg total) onto the skin once a week., Disp: 12 patch, Rfl: 3   cloNIDine (CATAPRES) 0.1 MG tablet, Take 1 tablet (0.1 mg total) by mouth at bedtime., Disp: 90 tablet, Rfl: 3   DULoxetine (CYMBALTA) 60 MG capsule, Take 2 capsules (120 mg total) by mouth daily., Disp: 180 capsule, Rfl: 1   eszopiclone (LUNESTA) 2 MG TABS tablet, Take 1 tablet (2 mg total) by mouth at bedtime as needed for sleep. Take immediately before bedtime, Disp: 30 tablet, Rfl: 2   hydrOXYzine (ATARAX/VISTARIL) 10 MG tablet, Take 1 tablet (10 mg total) by mouth 3 (three) times daily as needed., Disp: 30 tablet, Rfl: 0   lidocaine-prilocaine (EMLA) cream, Apply 1 application topically as needed. (Patient taking differently: Apply 1 application topically as needed [facility]).), Disp: 30 g, Rfl: 3   loratadine (CLARITIN) 10 MG tablet, TAKE 1 TABLET(10 MG) BY MOUTH EVERY MORNING, Disp: 90 tablet, Rfl: 3   LORazepam (ATIVAN) 0.5 MG tablet, Take 1 tablet (0.5 mg total) by mouth every 8 (eight) hours as needed for anxiety., Disp: 60 tablet, Rfl: 0   Melatonin 10 MG SUBL, Take 1 tablet by mouth at bedtime. (Patient not taking: Reported on 11/24/2021), Disp: , Rfl:    metoprolol succinate (TOPROL-XL) 25 MG 24 hr tablet, Take 0.5 tablets (12.5 mg total) by mouth daily., Disp: 45 tablet, Rfl: 1   Multiple Vitamin (MULTIVITAMIN WITH MINERALS) TABS tablet, Take 1 tablet by mouth daily., Disp: , Rfl:    olaparib (LYNPARZA) 100 MG tablet, Take 2 tablets (200 mg total) by mouth 2 (two) times daily. Swallow whole. May take with  food to decrease nausea and vomiting., Disp: 120 tablet, Rfl: 3   omeprazole (PRILOSEC) 20 MG capsule, TAKE 1 CAPSULE(20 MG) BY MOUTH DAILY, Disp: 90 capsule, Rfl: 1   ondansetron (ZOFRAN) 8 MG tablet, One pill every 8 hours as needed for nausea/vomitting., Disp: 40 tablet, Rfl: 1   ondansetron (ZOFRAN-ODT) 8 MG disintegrating tablet, Take 1 tablet (8 mg total) by mouth every 8 (eight) hours as needed for nausea or vomiting., Disp: 30 tablet, Rfl: 3   ondansetron (ZOFRAN-ODT) 8 MG disintegrating tablet, Take 1 tablet (8 mg total) by mouth every 8 (eight) hours as needed for nausea or vomiting., Disp: 30 tablet, Rfl: 3   oxyCODONE (OXY IR/ROXICODONE) 5 MG immediate release tablet, Take 0.5-1 tablets (2.5-5 mg total) by mouth  every 6 (six) hours as needed for severe pain., Disp: 60 tablet, Rfl: 0   polyethylene glycol powder (GLYCOLAX/MIRALAX) 17 GM/SCOOP powder, Take 0.5 Containers by mouth daily as needed for mild constipation or moderate constipation., Disp: , Rfl:    pregabalin (LYRICA) 75 MG capsule, Take 1 capsule (75 mg total) by mouth 3 (three) times daily., Disp: 270 capsule, Rfl: 1   prochlorperazine (COMPAZINE) 10 MG tablet, Take 1 tablet (10 mg total) by mouth every 6 (six) hours as needed for nausea or vomiting., Disp: 40 tablet, Rfl: 3   prochlorperazine (COMPAZINE) 10 MG tablet, Take 1 tablet (10 mg total) by mouth every 6 (six) hours as needed for nausea or vomiting., Disp: 40 tablet, Rfl: 3   senna (SENOKOT) 8.6 MG tablet, Take 1 tablet by mouth daily as needed for constipation., Disp: , Rfl:    SUMAtriptan (IMITREX) 100 MG tablet, Take 1 tablet (100 mg total) by mouth every 2 (two) hours as needed for migraine. May repeat in 2 hours if headache persists or recurs., Disp: 10 tablet, Rfl: 0   valACYclovir (VALTREX) 1000 MG tablet, TAKE 1 TABLET(1000 MG) BY MOUTH TWICE DAILY AS NEEDED FOR OUTBREAK, Disp: 6 tablet, Rfl: 0 No current facility-administered medications for this  visit.  Facility-Administered Medications Ordered in Other Visits:    heparin lock flush 100 unit/mL, 250 Units, Intracatheter, Once PRN, Charlaine Dalton R, MD   sodium chloride flush (NS) 0.9 % injection 10 mL, 10 mL, Intravenous, Once, Cammie Sickle, MD  Observations/Objective: Patient is well-developed, well-nourished in no acute distress.  Resting comfortably at home. Mildly fatigued appearing, but nontoxic Head is normocephalic, atraumatic.  No labored breathing. Cough present.  Speaking in full sentences and tolerating own secretions Speech is clear and coherent with logical content.  Patient is alert and oriented at baseline.    Assessment and Plan: 1. Acute bacterial bronchitis Get plenty of rest and push fluids Use zyrtec for nasal congestion, runny nose, and/or sore throat Use flonase for nasal congestion and runny nose Use medications daily for symptom relief Prescribed prednisone wheezing and congestion Prescribed azithromycin for bronchitis Tessalon perles for cough Use OTC medications like ibuprofen or tylenol as needed fever or pain Follow up with PCP as needed Follow up in person at urgent care or go to the ED if you have any new or worsening symptoms such as fever, worsening cough, shortness of breath, chest tightness, chest pain, turning blue, changes in mental status, etc...    Follow Up Instructions: I discussed the assessment and treatment plan with the patient. The patient was provided an opportunity to ask questions and all were answered. The patient agreed with the plan and demonstrated an understanding of the instructions.  A copy of instructions were sent to the patient via MyChart unless otherwise noted below.    The patient was advised to call back or seek an in-person evaluation if the symptoms worsen or if the condition fails to improve as anticipated.  Time:  I spent 10 minutes with the patient via telehealth technology discussing the  above problems/concerns.    Brianna Box, PA-C

## 2021-12-06 NOTE — Telephone Encounter (Signed)
Lvm for pt to call and schedule an appt  

## 2021-12-14 ENCOUNTER — Inpatient Hospital Stay: Payer: Medicaid Other | Attending: Internal Medicine | Admitting: Oncology

## 2021-12-14 ENCOUNTER — Inpatient Hospital Stay: Payer: Medicaid Other

## 2021-12-14 ENCOUNTER — Encounter: Payer: Self-pay | Admitting: Oncology

## 2021-12-14 ENCOUNTER — Other Ambulatory Visit: Payer: Self-pay

## 2021-12-14 VITALS — BP 132/89 | HR 85 | Temp 97.6°F | Resp 16 | Ht 63.0 in | Wt 258.0 lb

## 2021-12-14 DIAGNOSIS — K5900 Constipation, unspecified: Secondary | ICD-10-CM

## 2021-12-14 DIAGNOSIS — Z5112 Encounter for antineoplastic immunotherapy: Secondary | ICD-10-CM | POA: Insufficient documentation

## 2021-12-14 DIAGNOSIS — Z79899 Other long term (current) drug therapy: Secondary | ICD-10-CM | POA: Diagnosis not present

## 2021-12-14 DIAGNOSIS — C482 Malignant neoplasm of peritoneum, unspecified: Secondary | ICD-10-CM

## 2021-12-14 DIAGNOSIS — Z7189 Other specified counseling: Secondary | ICD-10-CM

## 2021-12-14 DIAGNOSIS — C786 Secondary malignant neoplasm of retroperitoneum and peritoneum: Secondary | ICD-10-CM | POA: Insufficient documentation

## 2021-12-14 LAB — COMPREHENSIVE METABOLIC PANEL
ALT: 22 U/L (ref 0–44)
AST: 22 U/L (ref 15–41)
Albumin: 3.8 g/dL (ref 3.5–5.0)
Alkaline Phosphatase: 55 U/L (ref 38–126)
Anion gap: 10 (ref 5–15)
BUN: 13 mg/dL (ref 6–20)
CO2: 27 mmol/L (ref 22–32)
Calcium: 9 mg/dL (ref 8.9–10.3)
Chloride: 100 mmol/L (ref 98–111)
Creatinine, Ser: 0.71 mg/dL (ref 0.44–1.00)
GFR, Estimated: >60 mL/min (ref >60)
Glucose, Bld: 128 mg/dL — ABNORMAL HIGH (ref 70–99)
Potassium: 3.4 mmol/L — ABNORMAL LOW (ref 3.5–5.1)
Sodium: 137 mmol/L (ref 135–145)
Total Bilirubin: 0.8 mg/dL (ref 0.3–1.2)
Total Protein: 7 g/dL (ref 6.5–8.1)

## 2021-12-14 LAB — CBC WITH DIFFERENTIAL/PLATELET
Abs Immature Granulocytes: 0.07 10*3/uL (ref 0.00–0.07)
Basophils Absolute: 0 10*3/uL (ref 0.0–0.1)
Basophils Relative: 0 %
Eosinophils Absolute: 0 10*3/uL (ref 0.0–0.5)
Eosinophils Relative: 0 %
HCT: 40.9 % (ref 36.0–46.0)
Hemoglobin: 14.1 g/dL (ref 12.0–15.0)
Immature Granulocytes: 1 %
Lymphocytes Relative: 36 %
Lymphs Abs: 3.7 10*3/uL (ref 0.7–4.0)
MCH: 40.4 pg — ABNORMAL HIGH (ref 26.0–34.0)
MCHC: 34.5 g/dL (ref 30.0–36.0)
MCV: 117.2 fL — ABNORMAL HIGH (ref 80.0–100.0)
Monocytes Absolute: 0.8 10*3/uL (ref 0.1–1.0)
Monocytes Relative: 8 %
Neutro Abs: 5.6 10*3/uL (ref 1.7–7.7)
Neutrophils Relative %: 55 %
Platelets: 151 10*3/uL (ref 150–400)
RBC: 3.49 MIL/uL — ABNORMAL LOW (ref 3.87–5.11)
RDW: 16.2 % — ABNORMAL HIGH (ref 11.5–15.5)
WBC: 10.3 10*3/uL (ref 4.0–10.5)
nRBC: 0 % (ref 0.0–0.2)

## 2021-12-14 LAB — URINALYSIS, COMPLETE (UACMP) WITH MICROSCOPIC
Bacteria, UA: NONE SEEN
Bilirubin Urine: NEGATIVE
Glucose, UA: NEGATIVE mg/dL
Hgb urine dipstick: NEGATIVE
Ketones, ur: NEGATIVE mg/dL
Nitrite: NEGATIVE
Protein, ur: NEGATIVE mg/dL
Specific Gravity, Urine: 1.023 (ref 1.005–1.030)
pH: 5 (ref 5.0–8.0)

## 2021-12-14 LAB — PROTEIN / CREATININE RATIO, URINE
Creatinine, Urine: 170 mg/dL
Protein Creatinine Ratio: 0.15 mg/mg{Cre} (ref 0.00–0.15)
Total Protein, Urine: 25 mg/dL

## 2021-12-14 MED ORDER — SODIUM CHLORIDE 0.9 % IV SOLN
Freq: Once | INTRAVENOUS | Status: AC
  Filled 2021-12-14: qty 250

## 2021-12-14 MED ORDER — SODIUM CHLORIDE 0.9 % IV SOLN
15.0000 mg/kg | Freq: Once | INTRAVENOUS | Status: AC
  Administered 2021-12-14: 15:00:00 1700 mg via INTRAVENOUS
  Filled 2021-12-14: qty 64

## 2021-12-14 MED ORDER — SODIUM CHLORIDE 0.9% FLUSH
10.0000 mL | INTRAVENOUS | Status: DC | PRN
  Filled 2021-12-14: qty 10

## 2021-12-14 MED ORDER — HEPARIN SOD (PORK) LOCK FLUSH 100 UNIT/ML IV SOLN
500.0000 [IU] | Freq: Once | INTRAVENOUS | Status: AC | PRN
  Administered 2021-12-14: 16:00:00 500 [IU]
  Filled 2021-12-14: qty 5
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
## 2021-12-14 NOTE — Patient Instructions (Signed)
[facility] CTR AT [place]  Discharge Instructions: Thank you for choosing [facility] to provide your oncology and hematology care.  If you have a lab appointment with the [facility], please go directly to the [facility] and check in at the registration area.  Wear comfortable clothing and clothing appropriate for easy access to any Portacath or PICC line.   We strive to give you quality time with your provider. You may need to reschedule your appointment if you arrive late (15 or more minutes).  Arriving late affects you and other patients whose appointments are after yours.  Also, if you miss three or more appointments without notifying the office, you may be dismissed from the clinic at the providers discretion.      For prescription refill requests, have your pharmacy contact our office and allow 72 hours for refills to be completed.    Today you received the following chemotherapy and/or immunotherapy agents Mvasi      To help prevent nausea and vomiting after your treatment, we encourage you to take your nausea medication as directed.  BELOW ARE SYMPTOMS THAT SHOULD BE REPORTED IMMEDIATELY: *FEVER GREATER THAN 100.4 F (38 C) OR HIGHER *CHILLS OR SWEATING *NAUSEA AND VOMITING THAT IS NOT CONTROLLED WITH YOUR NAUSEA MEDICATION *UNUSUAL SHORTNESS OF BREATH *UNUSUAL BRUISING OR BLEEDING *URINARY PROBLEMS (pain or burning when urinating, or frequent urination) *BOWEL PROBLEMS (unusual diarrhea, constipation, pain near the anus) TENDERNESS IN MOUTH AND THROAT WITH OR WITHOUT PRESENCE OF ULCERS (sore throat, sores in mouth, or a toothache) UNUSUAL RASH, SWELLING OR PAIN  UNUSUAL VAGINAL DISCHARGE OR ITCHING   Items with * indicate a potential emergency and should be followed up as soon as possible or go to the Emergency Department if any problems should occur.  Please show the CHEMOTHERAPY ALERT CARD or IMMUNOTHERAPY ALERT CARD at check-in to the  Emergency Department and triage nurse.  Should you have questions after your visit or need to cancel or reschedule your appointment, please contact [facility] [place] AT [place]  [phone] and follow the prompts.  Office hours are 8:00 a.m. to 4:30 p.m. Monday - Friday. Please note that voicemails left after 4:00 p.m. may not be returned until the following business day.  We are closed weekends and major holidays. You have access to a nurse at all times for urgent questions. Please call the main number to the clinic [phone] and follow the prompts.  For any non-urgent questions, you may also contact your provider using MyChart. We now offer e-Visits for anyone 23 and older to request care online for non-urgent symptoms. For details visit [email or web address].   Also download the MyChart app! Go to the app store, search "MyChart", open the app, select [facility], and log in with your MyChart username and password.  Due to Covid, a mask is required upon entering the hospital/clinic. If you do not have a mask, one will be given to you upon arrival. For doctor visits, patients may have 1 support person aged 81 or older with them. For treatment visits, patients cannot have anyone with them due to current Covid guidelines and our immunocompromised population.

## 2021-12-14 NOTE — Progress Notes (Signed)
Per Rulon Abide, NP, okay to use urine protein from 11/24/21 (30) for today's treatment.

## 2021-12-15 ENCOUNTER — Other Ambulatory Visit: Payer: Self-pay | Admitting: Hospice and Palliative Medicine

## 2021-12-15 ENCOUNTER — Other Ambulatory Visit: Payer: Self-pay | Admitting: Family Medicine

## 2021-12-15 DIAGNOSIS — B001 Herpesviral vesicular dermatitis: Secondary | ICD-10-CM

## 2021-12-15 LAB — CA 125: Cancer Antigen (CA) 125: 5.7 U/mL (ref 0.0–38.1)

## 2021-12-15 MED ORDER — VALACYCLOVIR HCL 1 G PO TABS: 1000.0000 mg | ORAL_TABLET | Freq: Two times a day (BID) | ORAL | 0 refills | Status: DC | PRN

## 2021-12-15 MED ORDER — OXYCODONE HCL 5 MG PO TABS
2.5000 mg | ORAL_TABLET | Freq: Four times a day (QID) | ORAL | 0 refills | Status: DC | PRN
Start: 2021-12-15 — End: 2022-01-17

## 2021-12-16 ENCOUNTER — Inpatient Hospital Stay (HOSPITAL_BASED_OUTPATIENT_CLINIC_OR_DEPARTMENT_OTHER): Payer: Medicaid Other | Admitting: Hospice and Palliative Medicine

## 2021-12-16 ENCOUNTER — Other Ambulatory Visit (HOSPITAL_COMMUNITY): Payer: Self-pay

## 2021-12-16 DIAGNOSIS — Z515 Encounter for palliative care: Secondary | ICD-10-CM

## 2021-12-16 NOTE — Progress Notes (Signed)
Did not reach patient by phone.  Voicemail left.  Will reschedule.

## 2021-12-22 ENCOUNTER — Other Ambulatory Visit (HOSPITAL_COMMUNITY): Payer: Self-pay

## 2021-12-24 ENCOUNTER — Other Ambulatory Visit (HOSPITAL_COMMUNITY): Payer: Self-pay

## 2021-12-29 ENCOUNTER — Other Ambulatory Visit (HOSPITAL_COMMUNITY): Payer: Self-pay

## 2021-12-30 ENCOUNTER — Other Ambulatory Visit (HOSPITAL_COMMUNITY): Payer: Self-pay

## 2022-01-04 ENCOUNTER — Inpatient Hospital Stay: Payer: Medicaid Other | Admitting: Internal Medicine

## 2022-01-04 ENCOUNTER — Inpatient Hospital Stay: Payer: Medicaid Other

## 2022-01-04 ENCOUNTER — Inpatient Hospital Stay: Payer: Medicaid Other | Attending: Internal Medicine

## 2022-01-04 DIAGNOSIS — D649 Anemia, unspecified: Secondary | ICD-10-CM | POA: Insufficient documentation

## 2022-01-04 DIAGNOSIS — C482 Malignant neoplasm of peritoneum, unspecified: Secondary | ICD-10-CM | POA: Insufficient documentation

## 2022-01-04 DIAGNOSIS — I10 Essential (primary) hypertension: Secondary | ICD-10-CM | POA: Insufficient documentation

## 2022-01-04 DIAGNOSIS — Z5111 Encounter for antineoplastic chemotherapy: Secondary | ICD-10-CM | POA: Insufficient documentation

## 2022-01-05 ENCOUNTER — Encounter: Payer: Self-pay | Admitting: Internal Medicine

## 2022-01-05 ENCOUNTER — Inpatient Hospital Stay: Payer: Medicaid Other

## 2022-01-10 ENCOUNTER — Telehealth: Payer: Self-pay | Admitting: Internal Medicine

## 2022-01-10 NOTE — Telephone Encounter (Signed)
Called patient to reschedule miss appt on 01/04/22. Pt did not answer. Detailed message left for patient to contact office to reschedule miss appt.

## 2022-01-11 ENCOUNTER — Encounter: Payer: Self-pay | Admitting: Internal Medicine

## 2022-01-12 ENCOUNTER — Other Ambulatory Visit: Payer: Self-pay

## 2022-01-12 ENCOUNTER — Inpatient Hospital Stay (HOSPITAL_BASED_OUTPATIENT_CLINIC_OR_DEPARTMENT_OTHER): Payer: Medicaid Other | Admitting: Hospice and Palliative Medicine

## 2022-01-12 DIAGNOSIS — Z515 Encounter for palliative care: Secondary | ICD-10-CM

## 2022-01-12 NOTE — Progress Notes (Signed)
Did not reach patient.  Voicemail left.  Will reschedule.

## 2022-01-17 ENCOUNTER — Other Ambulatory Visit: Payer: Self-pay | Admitting: Hospice and Palliative Medicine

## 2022-01-18 ENCOUNTER — Encounter: Payer: Self-pay | Admitting: Internal Medicine

## 2022-01-18 ENCOUNTER — Inpatient Hospital Stay (HOSPITAL_BASED_OUTPATIENT_CLINIC_OR_DEPARTMENT_OTHER): Payer: Medicaid Other | Admitting: Internal Medicine

## 2022-01-18 ENCOUNTER — Inpatient Hospital Stay: Payer: Medicaid Other

## 2022-01-18 ENCOUNTER — Other Ambulatory Visit: Payer: Self-pay

## 2022-01-18 ENCOUNTER — Other Ambulatory Visit (HOSPITAL_COMMUNITY): Payer: Self-pay

## 2022-01-18 DIAGNOSIS — I10 Essential (primary) hypertension: Secondary | ICD-10-CM | POA: Diagnosis not present

## 2022-01-18 DIAGNOSIS — C482 Malignant neoplasm of peritoneum, unspecified: Secondary | ICD-10-CM

## 2022-01-18 DIAGNOSIS — Z7189 Other specified counseling: Secondary | ICD-10-CM

## 2022-01-18 DIAGNOSIS — Z5111 Encounter for antineoplastic chemotherapy: Secondary | ICD-10-CM | POA: Diagnosis present

## 2022-01-18 DIAGNOSIS — D649 Anemia, unspecified: Secondary | ICD-10-CM | POA: Diagnosis not present

## 2022-01-18 LAB — COMPREHENSIVE METABOLIC PANEL
ALT: 24 U/L (ref 0–44)
AST: 34 U/L (ref 15–41)
Albumin: 4 g/dL (ref 3.5–5.0)
Alkaline Phosphatase: 51 U/L (ref 38–126)
Anion gap: 11 (ref 5–15)
BUN: 12 mg/dL (ref 6–20)
CO2: 24 mmol/L (ref 22–32)
Calcium: 9.3 mg/dL (ref 8.9–10.3)
Chloride: 104 mmol/L (ref 98–111)
Creatinine, Ser: 0.83 mg/dL (ref 0.44–1.00)
GFR, Estimated: >60 mL/min (ref >60)
Glucose, Bld: 129 mg/dL — ABNORMAL HIGH (ref 70–99)
Potassium: 3.5 mmol/L (ref 3.5–5.1)
Sodium: 139 mmol/L (ref 135–145)
Total Bilirubin: 0.6 mg/dL (ref 0.3–1.2)
Total Protein: 7.2 g/dL (ref 6.5–8.1)

## 2022-01-18 LAB — CBC WITH DIFFERENTIAL/PLATELET
Abs Immature Granulocytes: 0.02 10*3/uL (ref 0.00–0.07)
Basophils Absolute: 0.1 10*3/uL (ref 0.0–0.1)
Basophils Relative: 1 %
Eosinophils Absolute: 0.1 10*3/uL (ref 0.0–0.5)
Eosinophils Relative: 2 %
HCT: 41 % (ref 36.0–46.0)
Hemoglobin: 13.7 g/dL (ref 12.0–15.0)
Immature Granulocytes: 0 %
Lymphocytes Relative: 32 %
Lymphs Abs: 1.7 10*3/uL (ref 0.7–4.0)
MCH: 40.2 pg — ABNORMAL HIGH (ref 26.0–34.0)
MCHC: 33.4 g/dL (ref 30.0–36.0)
MCV: 120.2 fL — ABNORMAL HIGH (ref 80.0–100.0)
Monocytes Absolute: 0.5 10*3/uL (ref 0.1–1.0)
Monocytes Relative: 9 %
Neutro Abs: 3 10*3/uL (ref 1.7–7.7)
Neutrophils Relative %: 56 %
Platelets: 135 10*3/uL — ABNORMAL LOW (ref 150–400)
RBC: 3.41 MIL/uL — ABNORMAL LOW (ref 3.87–5.11)
RDW: 15.4 % (ref 11.5–15.5)
WBC: 5.3 10*3/uL (ref 4.0–10.5)
nRBC: 0 % (ref 0.0–0.2)

## 2022-01-18 MED ORDER — HEPARIN SOD (PORK) LOCK FLUSH 100 UNIT/ML IV SOLN
500.0000 [IU] | Freq: Once | INTRAVENOUS | Status: AC | PRN
  Administered 2022-01-18: 11:00:00 500 [IU]
  Filled 2022-01-18: qty 5

## 2022-01-18 MED ORDER — SODIUM CHLORIDE 0.9 % IV SOLN
Freq: Once | INTRAVENOUS | Status: AC
  Filled 2022-01-18: qty 250

## 2022-01-18 MED ORDER — SODIUM CHLORIDE 0.9 % IV SOLN
15.0000 mg/kg | Freq: Once | INTRAVENOUS | Status: AC
  Administered 2022-01-18: 11:00:00 1700 mg via INTRAVENOUS
  Filled 2022-01-18: qty 64

## 2022-01-18 MED ORDER — HEPARIN SOD (PORK) LOCK FLUSH 100 UNIT/ML IV SOLN
500.0000 [IU] | Freq: Once | INTRAVENOUS | Status: DC
  Filled 2022-01-18: qty 5

## 2022-01-18 MED ORDER — OXYCODONE HCL 5 MG PO TABS
2.5000 mg | ORAL_TABLET | Freq: Four times a day (QID) | ORAL | 0 refills | Status: DC | PRN
Start: 2022-01-18 — End: 2022-02-24

## 2022-01-18 MED ORDER — SODIUM CHLORIDE 0.9% FLUSH
10.0000 mL | INTRAVENOUS | Status: DC | PRN
  Administered 2022-01-18: 09:00:00 10 mL via INTRAVENOUS
  Filled 2022-01-18: qty 10
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
## 2022-01-18 NOTE — Progress Notes (Signed)
Ok to proceed with tx today and use urine protein results from 12/14/21.

## 2022-01-18 NOTE — Assessment & Plan Note (Addendum)
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 STABLE; tumor markers- improving.  Currently off bevacizumab [last infusion 02/05/2021-secondary to ventral hernia surgery]. On avastin + lynparza-STABLE.    # Proceed with avastin today; CBC- platelets 135; CMP are reviewed; adequate today.  Continue Lynparza 200 mg twice a day [severe anemia while on 250 mg twice a day].   #Hypertension: Blood pressure 121/74  Monitor closely on Avastin.  Continue metoprolol/clonidine hot flashes.STABLE.   #  Severe recurrent anemia secondary to Lynparza [250 mg BID]-currently on 200 mg BID tolerating extremely well- today Hb 13- STABLE.    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   # Nausea- continue Zofran/ compazine- STABLE.   #IV access/Mediport-stable/port flush  # DISPOSITION: afternoon- Tuesday/mychart # avastin today # in 3 weeks - MD;   Avastin; labs-  UA; cbc/cmp; ca-125; urin random protein:creatiine ratio- Dr.B

## 2022-01-18 NOTE — Progress Notes (Signed)
Patient denies new problems/concerns today.   °

## 2022-01-19 LAB — CA 125: Cancer Antigen (CA) 125: 6.4 U/mL (ref 0.0–38.1)

## 2022-01-20 ENCOUNTER — Other Ambulatory Visit (HOSPITAL_COMMUNITY): Payer: Self-pay

## 2022-01-21 DIAGNOSIS — Z1231 Encounter for screening mammogram for malignant neoplasm of breast: Secondary | ICD-10-CM | POA: Diagnosis not present

## 2022-01-21 DIAGNOSIS — L304 Erythema intertrigo: Secondary | ICD-10-CM | POA: Diagnosis not present

## 2022-01-21 LAB — HM MAMMOGRAPHY

## 2022-01-22 ENCOUNTER — Telehealth: Payer: Self-pay | Admitting: Family Medicine

## 2022-01-22 DIAGNOSIS — M51369 Other intervertebral disc degeneration, lumbar region without mention of lumbar back pain or lower extremity pain: Secondary | ICD-10-CM

## 2022-01-22 DIAGNOSIS — F331 Major depressive disorder, recurrent, moderate: Secondary | ICD-10-CM

## 2022-01-22 DIAGNOSIS — M5136 Other intervertebral disc degeneration, lumbar region: Secondary | ICD-10-CM

## 2022-01-22 NOTE — Telephone Encounter (Signed)
last RF 08/31/21 #180 1 RF too soon Requested Prescriptions  Refused Prescriptions Disp Refills   DULoxetine (CYMBALTA) 60 MG capsule [Pharmacy Med Name: DULOXETINE DR 60MG  CAPSULES] 180 capsule 1    Sig: TAKE 2 CAPSULE BY MOUTH DAILY     Psychiatry: Antidepressants - SNRI Passed - 01/22/2022  8:08 AM      Passed - Completed PHQ-2 or PHQ-9 in the last 360 days      Passed - Last BP in normal range    BP Readings from Last 1 Encounters:  01/18/22 130/86         Passed - Valid encounter within last 6 months    Recent Outpatient Visits          4 months ago Moderate episode of recurrent major depressive disorder [facility])   [facility] Steele Sizer, MD   1 year ago Surgical menopause, symptomatic   [facility] Steele Sizer, MD   1 year ago Cancer [facility])   [facility] Steele Sizer, MD   1 year ago Serous adenocarcinoma [facility])   [facility] Steele Sizer, MD   2 years ago Cancer [facility])   [facility] Steele Sizer, MD

## 2022-01-24 ENCOUNTER — Other Ambulatory Visit: Payer: Self-pay

## 2022-01-24 ENCOUNTER — Other Ambulatory Visit (HOSPITAL_COMMUNITY): Payer: Self-pay

## 2022-01-24 DIAGNOSIS — M51369 Other intervertebral disc degeneration, lumbar region without mention of lumbar back pain or lower extremity pain: Secondary | ICD-10-CM

## 2022-01-24 DIAGNOSIS — F331 Major depressive disorder, recurrent, moderate: Secondary | ICD-10-CM

## 2022-01-24 DIAGNOSIS — M5136 Other intervertebral disc degeneration, lumbar region: Secondary | ICD-10-CM

## 2022-01-24 NOTE — Telephone Encounter (Signed)
Per Dr Ancil Boozer patient needs to be seen here every 6 months and does not have an appts scheduled

## 2022-01-26 ENCOUNTER — Other Ambulatory Visit (HOSPITAL_COMMUNITY): Payer: Self-pay

## 2022-02-02 ENCOUNTER — Other Ambulatory Visit (HOSPITAL_COMMUNITY): Payer: Self-pay

## 2022-02-03 ENCOUNTER — Other Ambulatory Visit (HOSPITAL_COMMUNITY): Payer: Self-pay

## 2022-02-08 ENCOUNTER — Inpatient Hospital Stay: Payer: Medicaid Other

## 2022-02-08 ENCOUNTER — Inpatient Hospital Stay: Payer: Medicaid Other | Admitting: Internal Medicine

## 2022-02-18 ENCOUNTER — Inpatient Hospital Stay: Payer: Medicaid Other | Admitting: Nurse Practitioner

## 2022-02-18 ENCOUNTER — Inpatient Hospital Stay: Payer: Medicaid Other

## 2022-02-18 ENCOUNTER — Telehealth: Payer: Self-pay | Admitting: Internal Medicine

## 2022-02-18 NOTE — Telephone Encounter (Signed)
Done

## 2022-02-18 NOTE — Telephone Encounter (Signed)
Please r/s patient for 1 week.

## 2022-02-18 NOTE — Telephone Encounter (Signed)
Pt called and stated she has the flu and would need to cancel todays appts. She requested that someone give her a cal back to reschedule at a later date. I was not sure when Dr. Rogue Bussing would want to schedule her for.

## 2022-02-23 ENCOUNTER — Other Ambulatory Visit: Payer: Self-pay

## 2022-02-23 ENCOUNTER — Inpatient Hospital Stay: Payer: Medicaid Other | Attending: Hospice and Palliative Medicine | Admitting: Hospice and Palliative Medicine

## 2022-02-23 DIAGNOSIS — Z515 Encounter for palliative care: Secondary | ICD-10-CM

## 2022-02-23 DIAGNOSIS — Z5112 Encounter for antineoplastic immunotherapy: Secondary | ICD-10-CM | POA: Insufficient documentation

## 2022-02-23 DIAGNOSIS — C482 Malignant neoplasm of peritoneum, unspecified: Secondary | ICD-10-CM | POA: Insufficient documentation

## 2022-02-23 DIAGNOSIS — Z79899 Other long term (current) drug therapy: Secondary | ICD-10-CM | POA: Insufficient documentation

## 2022-02-23 NOTE — Progress Notes (Addendum)
Virtual Visit via Telephone Note ? ?I connected with [name] on 02/24/22 at 11:30 AM EST by telephone and verified that I am speaking with the correct person using two identifiers. ? ?Location: ?Patient: Home ?Provider: Clinic ?  ?I discussed the limitations, risks, security and privacy concerns of performing an evaluation and management service by telephone and the availability of in person appointments. I also discussed with the patient that there may be a patient responsible charge related to this service. The patient expressed understanding and agreed to proceed. ? ? 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  Patient is also status post neoadjuvant carbo/Taxol on maintenance Avastin/Lynparza.  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  Patient has also had chronic abdominal pain.  She was referred to palliative care to help address goals and manage ongoing symptoms. ?  ?Observations/Objective: ?Routine follow-up.   ? ?Patient reports that her grandson with flu positive last week and subsequently she became sick.  She reports that she is slowly feeling better but continues to have a nonproductive cough that is keeping her awake at night.  She has tried benzonatate and Promethazine DM without relief.  She is requesting something stronger for cough.  Patient denies fever or chills.  No muscle aches or body aches.  No shortness of breath or wheezing reported. ? ?Pain is reportedly stable on as needed oxycodone. ? ?No other symptomatic complaints reported. ? ?Assessment and Plan: ?Stage IV serous adenocarcinoma  -on systemic chemotherapy.  Stable tumor markers.  Followed by Dr. Rogue Bussing ? ?Cough -in context of probable flu exposure.  Will send Rx for Tussionex ?  ?Neoplasm related pain-continue oxycodone ? ?Insomnia -continue Lunesta ? ?Follow Up Instructions: ?Follow-up MyChart visit 2 months ?  ?I discussed the assessment and treatment plan with the patient. The patient was provided an opportunity to ask questions and all were answered. The patient agreed with the plan and demonstrated an understanding of the instructions. ?  ?The patient was advised to call back or seek an in-person evaluation if the symptoms worsen or if the condition fails to improve as anticipated. ? ?I provided 5 minutes of non-face-to-face time during this encounter. ? ? ?Irean Hong, NP ? ? ? ?

## 2022-02-24 ENCOUNTER — Other Ambulatory Visit: Payer: Self-pay

## 2022-02-24 MED ORDER — HYDROCOD POLI-CHLORPHE POLI ER 10-8 MG/5ML PO SUER
5.0000 mL | Freq: Every evening | ORAL | 0 refills | Status: DC | PRN
Start: 2022-02-24 — End: 2022-02-24

## 2022-02-24 MED ORDER — OXYCODONE HCL 5 MG PO TABS
2.5000 mg | ORAL_TABLET | Freq: Four times a day (QID) | ORAL | 0 refills | Status: DC | PRN
Start: 2022-02-24 — End: 2022-04-05

## 2022-02-24 MED ORDER — ESZOPICLONE 2 MG PO TABS: 2.0000 mg | ORAL_TABLET | Freq: Every evening | ORAL | 2 refills | Status: DC | PRN

## 2022-02-24 MED ORDER — HYDROCOD POLI-CHLORPHE POLI ER 10-8 MG/5ML PO SUER
5.0000 mL | Freq: Every evening | ORAL | 0 refills | Status: DC | PRN
Start: 2022-02-24 — End: 2022-05-17

## 2022-02-24 NOTE — Addendum Note (Signed)
Addended by: Irean Hong on: 02/24/2022 08:53 AM ? ? Modules accepted: Orders, Level of Service ? ?

## 2022-02-24 NOTE — Addendum Note (Signed)
Addended by: Altha Harm R on: 02/24/2022 12:39 PM ? ? Modules accepted: Orders ? ?

## 2022-02-24 NOTE — Addendum Note (Signed)
Addended by: Altha Harm R on: 02/24/2022 01:47 PM ? ? Modules accepted: Orders ? ?

## 2022-02-25 ENCOUNTER — Inpatient Hospital Stay: Payer: Medicaid Other

## 2022-02-25 ENCOUNTER — Inpatient Hospital Stay (HOSPITAL_BASED_OUTPATIENT_CLINIC_OR_DEPARTMENT_OTHER): Payer: Medicaid Other | Admitting: Nurse Practitioner

## 2022-02-25 ENCOUNTER — Other Ambulatory Visit: Payer: Self-pay | Admitting: Internal Medicine

## 2022-02-25 ENCOUNTER — Other Ambulatory Visit: Payer: Self-pay

## 2022-02-25 ENCOUNTER — Encounter: Payer: Self-pay | Admitting: Nurse Practitioner

## 2022-02-25 VITALS — BP 112/76 | HR 62 | Temp 97.8°F | Wt 249.7 lb

## 2022-02-25 DIAGNOSIS — C482 Malignant neoplasm of peritoneum, unspecified: Secondary | ICD-10-CM

## 2022-02-25 DIAGNOSIS — Z5112 Encounter for antineoplastic immunotherapy: Secondary | ICD-10-CM | POA: Diagnosis not present

## 2022-02-25 DIAGNOSIS — Z79899 Other long term (current) drug therapy: Secondary | ICD-10-CM | POA: Diagnosis not present

## 2022-02-25 DIAGNOSIS — Z5986 Financial insecurity: Secondary | ICD-10-CM

## 2022-02-25 DIAGNOSIS — F4323 Adjustment disorder with mixed anxiety and depressed mood: Secondary | ICD-10-CM

## 2022-02-25 DIAGNOSIS — Z7189 Other specified counseling: Secondary | ICD-10-CM

## 2022-02-25 DIAGNOSIS — Z8709 Personal history of other diseases of the respiratory system: Secondary | ICD-10-CM | POA: Diagnosis not present

## 2022-02-25 LAB — CBC WITH DIFFERENTIAL/PLATELET
Abs Immature Granulocytes: 0.02 10*3/uL (ref 0.00–0.07)
Basophils Absolute: 0 10*3/uL (ref 0.0–0.1)
Basophils Relative: 1 %
Eosinophils Absolute: 0.1 10*3/uL (ref 0.0–0.5)
Eosinophils Relative: 2 %
HCT: 40.4 % (ref 36.0–46.0)
Hemoglobin: 13.9 g/dL (ref 12.0–15.0)
Immature Granulocytes: 0 %
Lymphocytes Relative: 35 %
Lymphs Abs: 1.7 10*3/uL (ref 0.7–4.0)
MCH: 39.9 pg — ABNORMAL HIGH (ref 26.0–34.0)
MCHC: 34.4 g/dL (ref 30.0–36.0)
MCV: 116.1 fL — ABNORMAL HIGH (ref 80.0–100.0)
Monocytes Absolute: 0.4 10*3/uL (ref 0.1–1.0)
Monocytes Relative: 8 %
Neutro Abs: 2.6 10*3/uL (ref 1.7–7.7)
Neutrophils Relative %: 54 %
Platelets: 136 10*3/uL — ABNORMAL LOW (ref 150–400)
RBC: 3.48 MIL/uL — ABNORMAL LOW (ref 3.87–5.11)
RDW: 14.6 % (ref 11.5–15.5)
WBC: 4.9 10*3/uL (ref 4.0–10.5)
nRBC: 0 % (ref 0.0–0.2)

## 2022-02-25 LAB — COMPREHENSIVE METABOLIC PANEL
ALT: 15 U/L (ref 0–44)
AST: 20 U/L (ref 15–41)
Albumin: 3.6 g/dL (ref 3.5–5.0)
Alkaline Phosphatase: 51 U/L (ref 38–126)
Anion gap: 7 (ref 5–15)
BUN: 10 mg/dL (ref 6–20)
CO2: 27 mmol/L (ref 22–32)
Calcium: 9.1 mg/dL (ref 8.9–10.3)
Chloride: 104 mmol/L (ref 98–111)
Creatinine, Ser: 0.81 mg/dL (ref 0.44–1.00)
GFR, Estimated: >60 mL/min (ref >60)
Glucose, Bld: 100 mg/dL — ABNORMAL HIGH (ref 70–99)
Potassium: 3.8 mmol/L (ref 3.5–5.1)
Sodium: 138 mmol/L (ref 135–145)
Total Bilirubin: 0.3 mg/dL (ref 0.3–1.2)
Total Protein: 6.9 g/dL (ref 6.5–8.1)

## 2022-02-25 LAB — PROTEIN / CREATININE RATIO, URINE
Creatinine, Urine: 227 mg/dL
Protein Creatinine Ratio: 0.05 mg/mg{Cre} (ref 0.00–0.15)
Total Protein, Urine: 11 mg/dL

## 2022-02-25 LAB — URINALYSIS, COMPLETE (UACMP) WITH MICROSCOPIC
Bilirubin Urine: NEGATIVE
Glucose, UA: NEGATIVE mg/dL
Hgb urine dipstick: NEGATIVE
Ketones, ur: NEGATIVE mg/dL
Nitrite: NEGATIVE
Protein, ur: NEGATIVE mg/dL
Specific Gravity, Urine: 1.024 (ref 1.005–1.030)
pH: 5 (ref 5.0–8.0)

## 2022-02-25 MED ORDER — SODIUM CHLORIDE 0.9 % IV SOLN
15.0000 mg/kg | Freq: Once | INTRAVENOUS | Status: AC
  Administered 2022-02-25: 1700 mg via INTRAVENOUS
  Filled 2022-02-25: qty 64

## 2022-02-25 MED ORDER — SODIUM CHLORIDE 0.9 % IV SOLN
Freq: Once | INTRAVENOUS | Status: AC
  Filled 2022-02-25: qty 250

## 2022-02-25 MED ORDER — HEPARIN SOD (PORK) LOCK FLUSH 100 UNIT/ML IV SOLN
500.0000 [IU] | Freq: Once | INTRAVENOUS | Status: AC | PRN
  Administered 2022-02-25: 500 [IU]
  Filled 2022-02-25: qty 5
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
## 2022-02-25 NOTE — Patient Instructions (Addendum)
[facility] [place]  Discharge Instructions: Thank you for choosing [facility] to provide your oncology and hematology care.  If you have a lab appointment with the [facility], please go directly to the [facility] and check in at the registration area.  Wear comfortable clothing and clothing appropriate for easy access to any Portacath or PICC line.   We strive to give you quality time with your provider. You may need to reschedule your appointment if you arrive late (15 or more minutes).  Arriving late affects you and other patients whose appointments are after yours.  Also, if you miss three or more appointments without notifying the office, you may be dismissed from the clinic at the providers discretion.      For prescription refill requests, have your pharmacy contact our office and allow 72 hours for refills to be completed.    Today you received the following chemotherapy and/or immunotherapy agents MVASI      To help prevent nausea and vomiting after your treatment, we encourage you to take your nausea medication as directed.  BELOW ARE SYMPTOMS THAT SHOULD BE REPORTED IMMEDIATELY: *FEVER GREATER THAN 100.4 F (38 C) OR HIGHER *CHILLS OR SWEATING *NAUSEA AND VOMITING THAT IS NOT CONTROLLED WITH YOUR NAUSEA MEDICATION *UNUSUAL SHORTNESS OF BREATH *UNUSUAL BRUISING OR BLEEDING *URINARY PROBLEMS (pain or burning when urinating, or frequent urination) *BOWEL PROBLEMS (unusual diarrhea, constipation, pain near the anus) TENDERNESS IN MOUTH AND THROAT WITH OR WITHOUT PRESENCE OF ULCERS (sore throat, sores in mouth, or a toothache) UNUSUAL RASH, SWELLING OR PAIN  UNUSUAL VAGINAL DISCHARGE OR ITCHING   Items with * indicate a potential emergency and should be followed up as soon as possible or go to the Emergency Department if any problems should occur.  Please show the CHEMOTHERAPY ALERT CARD or IMMUNOTHERAPY ALERT CARD at check-in to the  Emergency Department and triage nurse.  Should you have questions after your visit or need to cancel or reschedule your appointment, please contact [facility] [place] [facility] [place]  [phone] and follow the prompts.  Office hours are 8:00 a.m. to 4:30 p.m. Monday - Friday. Please note that voicemails left after 4:00 p.m. may not be returned until the following business day.  We are closed weekends and major holidays. You have access to a nurse at all times for urgent questions. Please call the main number to the clinic [phone] and follow the prompts.  For any non-urgent questions, you may also contact your provider using MyChart. We now offer e-Visits for anyone 52 and older to request care online for non-urgent symptoms. For details visit [email or web address].   Also download the MyChart app! Go to the app store, search "MyChart", open the app, select [facility], and log in with your MyChart username and password.  Due to Covid, a mask is required upon entering the hospital/clinic. If you do not have a mask, one will be given to you upon arrival. For doctor visits, patients may have 1 support person aged 62 or older with them. For treatment visits, patients cannot have anyone with them due to current Covid guidelines and our immunocompromised population.   Bevacizumab injection What is this medication? BEVACIZUMAB (be va SIZ yoo mab) is a monoclonal antibody. It is used to treat many types of cancer. This medicine may be used for other purposes; ask your health care provider or pharmacist if you have questions. COMMON BRAND NAME(S): Alymsys, Avastin, MVASI, Zirabev What should I tell  my care team before I take this medication? They need to know if you have any of these conditions: diabetes heart disease high blood pressure history of coughing up blood prior anthracycline chemotherapy (e.g., doxorubicin, daunorubicin, epirubicin) recent or ongoing radiation  therapy recent or planning to have surgery stroke an unusual or allergic reaction to bevacizumab, hamster proteins, mouse proteins, other medicines, foods, dyes, or preservatives pregnant or trying to get pregnant breast-feeding How should I use this medication? This medicine is for infusion into a vein. It is given by a health care professional in a hospital or clinic setting. Talk to your pediatrician regarding the use of this medicine in children. Special care may be needed. Overdosage: If you think you have taken too much of this medicine contact a poison control center or emergency room at once. NOTE: This medicine is only for you. Do not share this medicine with others. What if I miss a dose? It is important not to miss your dose. Call your doctor or health care professional if you are unable to keep an appointment. What may interact with this medication? Interactions are not expected. This list may not describe all possible interactions. Give your health care provider a list of all the medicines, herbs, non-prescription drugs, or dietary supplements you use. Also tell them if you smoke, drink alcohol, or use illegal drugs. Some items may interact with your medicine. What should I watch for while using this medication? Your condition will be monitored carefully while you are receiving this medicine. You will need important blood work and urine testing done while you are taking this medicine. This medicine may increase your risk to bruise or bleed. Call your doctor or health care professional if you notice any unusual bleeding. Before having surgery, talk to your health care provider to make sure it is ok. This drug can increase the risk of poor healing of your surgical site or wound. You will need to stop this drug for 28 days before surgery. After surgery, wait at least 28 days before restarting this drug. Make sure the surgical site or wound is healed enough before restarting this drug.  Talk to your health care provider if questions. Do not become pregnant while taking this medicine or for 6 months after stopping it. Women should inform their doctor if they wish to become pregnant or think they might be pregnant. There is a potential for serious side effects to an unborn child. Talk to your health care professional or pharmacist for more information. Do not breast-feed an infant while taking this medicine and for 6 months after the last dose. This medicine has caused ovarian failure in some women. This medicine may interfere with the ability to have a child. You should talk to your doctor or health care professional if you are concerned about your fertility. What side effects may I notice from receiving this medication? Side effects that you should report to your doctor or health care professional as soon as possible: allergic reactions like skin rash, itching or hives, swelling of the face, lips, or tongue chest pain or chest tightness chills coughing up blood high fever seizures severe constipation signs and symptoms of bleeding such as bloody or black, tarry stools; red or dark-brown urine; spitting up blood or brown material that looks like coffee grounds; red spots on the skin; unusual bruising or bleeding from the eye, gums, or nose signs and symptoms of a blood clot such as breathing problems; chest pain; severe, sudden headache; pain,  swelling, warmth in the leg signs and symptoms of a stroke like changes in vision; confusion; trouble speaking or understanding; severe headaches; sudden numbness or weakness of the face, arm or leg; trouble walking; dizziness; loss of balance or coordination stomach pain sweating swelling of legs or ankles vomiting weight gain Side effects that usually do not require medical attention (report to your doctor or health care professional if they continue or are bothersome): back pain changes in taste decreased appetite dry  skin nausea tiredness This list may not describe all possible side effects. Call your doctor for medical advice about side effects. You may report side effects to FDA at 1-800-FDA-1088. Where should I keep my medication? This drug is given in a hospital or clinic and will not be stored at home. NOTE: This sheet is a summary. It may not cover all possible information. If you have questions about this medicine, talk to your doctor, pharmacist, or health care provider.  2022 Elsevier/Gold Standard (2021-08-31 00:00:00)

## 2022-02-26 LAB — CA 125: Cancer Antigen (CA) 125: 6.7 U/mL (ref 0.0–38.1)

## 2022-02-28 ENCOUNTER — Encounter: Payer: Self-pay | Admitting: Licensed Clinical Social Worker

## 2022-02-28 NOTE — Progress Notes (Signed)
[facility] ?Clinical Social Work ? ?Clinical Social Work was referred by medical provider for assessment of psychosocial needs.  Clinical Social Worker contacted patient by phone  to offer support and assess for needs.  CSW left voicemail with contact information and request for return call. ? ? ? ? ? ?Tilly Pernice, LCSW  ?Clinical Social Worker ?[facility] ?      ? ?

## 2022-03-04 ENCOUNTER — Other Ambulatory Visit (HOSPITAL_COMMUNITY): Payer: Self-pay

## 2022-03-08 ENCOUNTER — Other Ambulatory Visit: Payer: Self-pay | Admitting: Family Medicine

## 2022-03-08 DIAGNOSIS — K219 Gastro-esophageal reflux disease without esophagitis: Secondary | ICD-10-CM

## 2022-03-10 ENCOUNTER — Encounter: Payer: Self-pay | Admitting: Internal Medicine

## 2022-03-13 ENCOUNTER — Other Ambulatory Visit: Payer: Self-pay | Admitting: Internal Medicine

## 2022-03-14 ENCOUNTER — Other Ambulatory Visit (HOSPITAL_COMMUNITY): Payer: Self-pay

## 2022-03-14 ENCOUNTER — Other Ambulatory Visit: Payer: Self-pay | Admitting: Family Medicine

## 2022-03-14 DIAGNOSIS — G620 Drug-induced polyneuropathy: Secondary | ICD-10-CM

## 2022-03-14 DIAGNOSIS — M170 Bilateral primary osteoarthritis of knee: Secondary | ICD-10-CM | POA: Diagnosis not present

## 2022-03-15 ENCOUNTER — Other Ambulatory Visit: Payer: Self-pay | Admitting: General Surgery

## 2022-03-15 DIAGNOSIS — K439 Ventral hernia without obstruction or gangrene: Secondary | ICD-10-CM

## 2022-03-16 ENCOUNTER — Other Ambulatory Visit: Payer: Self-pay

## 2022-03-16 ENCOUNTER — Telehealth: Payer: Self-pay | Admitting: Medical Oncology

## 2022-03-16 ENCOUNTER — Telehealth: Payer: Self-pay | Admitting: *Deleted

## 2022-03-16 ENCOUNTER — Inpatient Hospital Stay (HOSPITAL_BASED_OUTPATIENT_CLINIC_OR_DEPARTMENT_OTHER): Payer: Medicaid Other | Admitting: Medical Oncology

## 2022-03-16 DIAGNOSIS — K130 Diseases of lips: Secondary | ICD-10-CM

## 2022-03-16 MED ORDER — CLOTRIMAZOLE 1 % EX CREA: 1.0000 "application " | TOPICAL_CREAM | Freq: Two times a day (BID) | CUTANEOUS | 0 refills | Status: DC
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
## 2022-03-16 NOTE — Telephone Encounter (Signed)
Patient called reporting that she has had cracks in the corners of her mouth for 2 months and over the counter remedy using chapstick is not effective. She states every time she tried to eat or brush her teeth, it cracks open and is painful. She states she looked on line about what to do for it and was recommended doctor send prescription  ?

## 2022-03-16 NOTE — Addendum Note (Signed)
Addended by: Nelwyn Salisbury on: 03/16/2022 03:18 PM ? ? Modules accepted: Orders, Level of Service ? ?

## 2022-03-16 NOTE — Telephone Encounter (Signed)
Patient did not log in for video symptom management visit today. She was called x 3 at 1:39 PM, 1:58 PM and 2:08 PM by myself without answer. She will need to reschedule visit ?

## 2022-03-16 NOTE — Telephone Encounter (Signed)
Pt contacted and scheduled for virtual [facility] visit this afternoon.  ?

## 2022-03-17 ENCOUNTER — Other Ambulatory Visit (HOSPITAL_COMMUNITY): Payer: Self-pay

## 2022-03-18 ENCOUNTER — Ambulatory Visit: Payer: Medicaid Other | Admitting: Internal Medicine

## 2022-03-18 ENCOUNTER — Ambulatory Visit: Payer: Medicaid Other

## 2022-03-18 ENCOUNTER — Other Ambulatory Visit: Payer: Medicaid Other

## 2022-03-24 ENCOUNTER — Ambulatory Visit: Payer: Medicaid Other

## 2022-03-24 ENCOUNTER — Other Ambulatory Visit (HOSPITAL_COMMUNITY): Payer: Self-pay

## 2022-03-25 ENCOUNTER — Ambulatory Visit
Admission: RE | Admit: 2022-03-25 | Discharge: 2022-03-25 | Disposition: A | Discharge status: Home / Self Care | Payer: Medicaid Other | Source: Ambulatory Visit | Attending: General Surgery | Admitting: General Surgery

## 2022-03-25 ENCOUNTER — Inpatient Hospital Stay: Payer: Medicaid Other

## 2022-03-25 ENCOUNTER — Inpatient Hospital Stay (HOSPITAL_BASED_OUTPATIENT_CLINIC_OR_DEPARTMENT_OTHER): Payer: Medicaid Other | Admitting: Medical Oncology

## 2022-03-25 VITALS — BP 126/91 | HR 65 | Temp 97.9°F | Resp 16 | Ht 63.0 in | Wt 247.2 lb

## 2022-03-25 DIAGNOSIS — C482 Malignant neoplasm of peritoneum, unspecified: Secondary | ICD-10-CM

## 2022-03-25 DIAGNOSIS — Z95828 Presence of other vascular implants and grafts: Secondary | ICD-10-CM

## 2022-03-25 DIAGNOSIS — D649 Anemia, unspecified: Secondary | ICD-10-CM | POA: Diagnosis not present

## 2022-03-25 DIAGNOSIS — K439 Ventral hernia without obstruction or gangrene: Secondary | ICD-10-CM | POA: Diagnosis not present

## 2022-03-25 DIAGNOSIS — K429 Umbilical hernia without obstruction or gangrene: Secondary | ICD-10-CM | POA: Diagnosis not present

## 2022-03-25 DIAGNOSIS — D696 Thrombocytopenia, unspecified: Secondary | ICD-10-CM | POA: Diagnosis not present

## 2022-03-25 DIAGNOSIS — K130 Diseases of lips: Secondary | ICD-10-CM

## 2022-03-25 DIAGNOSIS — Z5112 Encounter for antineoplastic immunotherapy: Secondary | ICD-10-CM | POA: Diagnosis not present

## 2022-03-25 DIAGNOSIS — Z7189 Other specified counseling: Secondary | ICD-10-CM

## 2022-03-25 LAB — URINALYSIS, COMPLETE (UACMP) WITH MICROSCOPIC
Bilirubin Urine: NEGATIVE
Glucose, UA: NEGATIVE mg/dL
Hgb urine dipstick: NEGATIVE
Ketones, ur: NEGATIVE mg/dL
Nitrite: NEGATIVE
Protein, ur: 30 mg/dL — AB
Specific Gravity, Urine: 1.026 (ref 1.005–1.030)
pH: 5 (ref 5.0–8.0)

## 2022-03-25 LAB — CBC WITH DIFFERENTIAL/PLATELET
Abs Immature Granulocytes: 0.03 10*3/uL (ref 0.00–0.07)
Basophils Absolute: 0.1 10*3/uL (ref 0.0–0.1)
Basophils Relative: 1 %
Eosinophils Absolute: 0 10*3/uL (ref 0.0–0.5)
Eosinophils Relative: 1 %
HCT: 42.2 % (ref 36.0–46.0)
Hemoglobin: 14.7 g/dL (ref 12.0–15.0)
Immature Granulocytes: 1 %
Lymphocytes Relative: 28 %
Lymphs Abs: 1.8 10*3/uL (ref 0.7–4.0)
MCH: 40.5 pg — ABNORMAL HIGH (ref 26.0–34.0)
MCHC: 34.8 g/dL (ref 30.0–36.0)
MCV: 116.3 fL — ABNORMAL HIGH (ref 80.0–100.0)
Monocytes Absolute: 0.4 10*3/uL (ref 0.1–1.0)
Monocytes Relative: 6 %
Neutro Abs: 4.2 10*3/uL (ref 1.7–7.7)
Neutrophils Relative %: 63 %
Platelets: 121 10*3/uL — ABNORMAL LOW (ref 150–400)
RBC: 3.63 MIL/uL — ABNORMAL LOW (ref 3.87–5.11)
RDW: 15.2 % (ref 11.5–15.5)
WBC: 6.5 10*3/uL (ref 4.0–10.5)
nRBC: 0 % (ref 0.0–0.2)

## 2022-03-25 LAB — COMPREHENSIVE METABOLIC PANEL
ALT: 14 U/L (ref 0–44)
AST: 20 U/L (ref 15–41)
Albumin: 3.8 g/dL (ref 3.5–5.0)
Alkaline Phosphatase: 56 U/L (ref 38–126)
Anion gap: 7 (ref 5–15)
BUN: 17 mg/dL (ref 6–20)
CO2: 24 mmol/L (ref 22–32)
Calcium: 9 mg/dL (ref 8.9–10.3)
Chloride: 103 mmol/L (ref 98–111)
Creatinine, Ser: 0.83 mg/dL (ref 0.44–1.00)
GFR, Estimated: >60 mL/min (ref >60)
Glucose, Bld: 113 mg/dL — ABNORMAL HIGH (ref 70–99)
Potassium: 4 mmol/L (ref 3.5–5.1)
Sodium: 134 mmol/L — ABNORMAL LOW (ref 135–145)
Total Bilirubin: 0.5 mg/dL (ref 0.3–1.2)
Total Protein: 6.7 g/dL (ref 6.5–8.1)

## 2022-03-25 LAB — PROTEIN / CREATININE RATIO, URINE
Creatinine, Urine: 190 mg/dL
Protein Creatinine Ratio: 0.16 mg/mg{Cre} — ABNORMAL HIGH (ref 0.00–0.15)
Total Protein, Urine: 31 mg/dL

## 2022-03-25 MED ORDER — NYSTATIN 100000 UNIT/GM EX POWD: 1.0000 "application " | Freq: Three times a day (TID) | CUTANEOUS | 0 refills | Status: DC

## 2022-03-25 MED ORDER — SODIUM CHLORIDE 0.9 % IV SOLN
15.0000 mg/kg | Freq: Once | INTRAVENOUS | Status: AC
  Administered 2022-03-25: 1700 mg via INTRAVENOUS
  Filled 2022-03-25: qty 64

## 2022-03-25 MED ORDER — IOHEXOL 300 MG/ML  SOLN
100.0000 mL | Freq: Once | INTRAMUSCULAR | Status: AC | PRN
  Administered 2022-03-25: 100 mL via INTRAVENOUS

## 2022-03-25 MED ORDER — ALTEPLASE 2 MG IJ SOLR
2.0000 mg | Freq: Once | INTRAMUSCULAR | Status: DC | PRN
  Filled 2022-03-25: qty 2

## 2022-03-25 MED ORDER — SODIUM CHLORIDE 0.9% FLUSH
10.0000 mL | Freq: Once | INTRAVENOUS | Status: AC
  Administered 2022-03-25: 10 mL via INTRAVENOUS
  Filled 2022-03-25: qty 10

## 2022-03-25 MED ORDER — HEPARIN SOD (PORK) LOCK FLUSH 100 UNIT/ML IV SOLN
500.0000 [IU] | Freq: Once | INTRAVENOUS | Status: AC | PRN
  Administered 2022-03-25: 500 [IU]
  Filled 2022-03-25: qty 5

## 2022-03-25 MED ORDER — SODIUM CHLORIDE 0.9 % IV SOLN
Freq: Once | INTRAVENOUS | Status: AC
  Filled 2022-03-25: qty 250

## 2022-03-25 NOTE — Patient Instructions (Signed)
[facility] AT [place]  Discharge Instructions: ?Thank you for choosing [facility] to provide your oncology and hematology care.  ?If you have a lab appointment with the [facility], please go directly to the [facility] and check in at the registration area. ? ?Wear comfortable clothing and clothing appropriate for easy access to any Portacath or PICC line.  ? ?We strive to give you quality time with your provider. You may need to reschedule your appointment if you arrive late (15 or more minutes).  Arriving late affects you and other patients whose appointments are after yours.  Also, if you miss three or more appointments without notifying the office, you may be dismissed from the clinic at the provider?s discretion.    ?  ?For prescription refill requests, have your pharmacy contact our office and allow 72 hours for refills to be completed.   ? ?Today you received the following chemotherapy and/or immunotherapy agents AVASTIN    ?  ?To help prevent nausea and vomiting after your treatment, we encourage you to take your nausea medication as directed. ? ?BELOW ARE SYMPTOMS THAT SHOULD BE REPORTED IMMEDIATELY: ?*FEVER GREATER THAN 100.4 F (38 ?C) OR HIGHER ?*CHILLS OR SWEATING ?*NAUSEA AND VOMITING THAT IS NOT CONTROLLED WITH YOUR NAUSEA MEDICATION ?*UNUSUAL SHORTNESS OF BREATH ?*UNUSUAL BRUISING OR BLEEDING ?*URINARY PROBLEMS (pain or burning when urinating, or frequent urination) ?*BOWEL PROBLEMS (unusual diarrhea, constipation, pain near the anus) ?TENDERNESS IN MOUTH AND THROAT WITH OR WITHOUT PRESENCE OF ULCERS (sore throat, sores in mouth, or a toothache) ?UNUSUAL RASH, SWELLING OR PAIN  ?UNUSUAL VAGINAL DISCHARGE OR ITCHING  ? ?Items with * indicate a potential emergency and should be followed up as soon as possible or go to the Emergency Department if any problems should occur. ? ?Please show the CHEMOTHERAPY ALERT CARD or IMMUNOTHERAPY ALERT CARD at check-in to the  Emergency Department and triage nurse. ? ?Should you have questions after your visit or need to cancel or reschedule your appointment, please contact [facility] [place] AT [place]  [phone] and follow the prompts.  Office hours are 8:00 a.m. to 4:30 p.m. Monday - Friday. Please note that voicemails left after 4:00 p.m. may not be returned until the following business day.  We are closed weekends and major holidays. You have access to a nurse at all times for urgent questions. Please call the main number to the clinic [phone] and follow the prompts. ? ?For any non-urgent questions, you may also contact your provider using MyChart. We now offer e-Visits for anyone 55 and older to request care online for non-urgent symptoms. For details visit [email or web address]. ?  ?Also download the MyChart app! Go to the app store, search "MyChart", open the app, select [facility], and log in with your MyChart username and password. ? ?Due to Covid, a mask is required upon entering the hospital/clinic. If you do not have a mask, one will be given to you upon arrival. For doctor visits, patients may have 1 support person aged 38 or older with them. For treatment visits, patients cannot have anyone with them due to current Covid guidelines and our immunocompromised population.  ? ?Bevacizumab injection ?What is this medication? ?BEVACIZUMAB (be va SIZ yoo mab) is a monoclonal antibody. It is used to treat many types of cancer. ?This medicine may be used for other purposes; ask your health care provider or pharmacist if you have questions. ?COMMON BRAND NAME(S): Alymsys, Avastin, MVASI, Zirabev ?What should I tell  my care team before I take this medication? ?They need to know if you have any of these conditions: ?diabetes ?heart disease ?high blood pressure ?history of coughing up blood ?prior anthracycline chemotherapy (e.g., doxorubicin, daunorubicin, epirubicin) ?recent or ongoing radiation  therapy ?recent or planning to have surgery ?stroke ?an unusual or allergic reaction to bevacizumab, hamster proteins, mouse proteins, other medicines, foods, dyes, or preservatives ?pregnant or trying to get pregnant ?breast-feeding ?How should I use this medication? ?This medicine is for infusion into a vein. It is given by a health care professional in a hospital or clinic setting. ?Talk to your pediatrician regarding the use of this medicine in children. Special care may be needed. ?Overdosage: If you think you have taken too much of this medicine contact a poison control center or emergency room at once. ?NOTE: This medicine is only for you. Do not share this medicine with others. ?What if I miss a dose? ?It is important not to miss your dose. Call your doctor or health care professional if you are unable to keep an appointment. ?What may interact with this medication? ?Interactions are not expected. ?This list may not describe all possible interactions. Give your health care provider a list of all the medicines, herbs, non-prescription drugs, or dietary supplements you use. Also tell them if you smoke, drink alcohol, or use illegal drugs. Some items may interact with your medicine. ?What should I watch for while using this medication? ?Your condition will be monitored carefully while you are receiving this medicine. You will need important blood work and urine testing done while you are taking this medicine. ?This medicine may increase your risk to bruise or bleed. Call your doctor or health care professional if you notice any unusual bleeding. ?Before having surgery, talk to your health care provider to make sure it is ok. This drug can increase the risk of poor healing of your surgical site or wound. You will need to stop this drug for 28 days before surgery. After surgery, wait at least 28 days before restarting this drug. Make sure the surgical site or wound is healed enough before restarting this drug.  Talk to your health care provider if questions. ?Do not become pregnant while taking this medicine or for 6 months after stopping it. Women should inform their doctor if they wish to become pregnant or think they might be pregnant. There is a potential for serious side effects to an unborn child. Talk to your health care professional or pharmacist for more information. Do not breast-feed an infant while taking this medicine and for 6 months after the last dose. ?This medicine has caused ovarian failure in some women. This medicine may interfere with the ability to have a child. You should talk to your doctor or health care professional if you are concerned about your fertility. ?What side effects may I notice from receiving this medication? ?Side effects that you should report to your doctor or health care professional as soon as possible: ?allergic reactions like skin rash, itching or hives, swelling of the face, lips, or tongue ?chest pain or chest tightness ?chills ?coughing up blood ?high fever ?seizures ?severe constipation ?signs and symptoms of bleeding such as bloody or black, tarry stools; red or dark-brown urine; spitting up blood or brown material that looks like coffee grounds; red spots on the skin; unusual bruising or bleeding from the eye, gums, or nose ?signs and symptoms of a blood clot such as breathing problems; chest pain; severe, sudden headache; pain,  swelling, warmth in the leg ?signs and symptoms of a stroke like changes in vision; confusion; trouble speaking or understanding; severe headaches; sudden numbness or weakness of the face, arm or leg; trouble walking; dizziness; loss of balance or coordination ?stomach pain ?sweating ?swelling of legs or ankles ?vomiting ?weight gain ?Side effects that usually do not require medical attention (report to your doctor or health care professional if they continue or are bothersome): ?back pain ?changes in taste ?decreased appetite ?dry  skin ?nausea ?tiredness ?This list may not describe all possible side effects. Call your doctor for medical advice about side effects. You may report side effects to FDA at 1-800-FDA-1088. ?Where should I keep my medication? ?Thi

## 2022-03-25 NOTE — Progress Notes (Signed)
Pt drinking contrast for CT scan for hernia after infusion. ?
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
## 2022-03-26 LAB — CA 125: Cancer Antigen (CA) 125: 5.8 U/mL (ref 0.0–38.1)

## 2022-03-28 ENCOUNTER — Other Ambulatory Visit (HOSPITAL_COMMUNITY): Payer: Self-pay

## 2022-03-31 ENCOUNTER — Other Ambulatory Visit (HOSPITAL_COMMUNITY): Payer: Self-pay

## 2022-04-04 ENCOUNTER — Other Ambulatory Visit (HOSPITAL_COMMUNITY): Payer: Self-pay

## 2022-04-05 ENCOUNTER — Other Ambulatory Visit: Payer: Self-pay | Admitting: Hospice and Palliative Medicine

## 2022-04-06 ENCOUNTER — Telehealth: Payer: Self-pay | Admitting: Internal Medicine

## 2022-04-06 ENCOUNTER — Encounter: Payer: Self-pay | Admitting: Internal Medicine

## 2022-04-06 MED ORDER — OXYCODONE HCL 5 MG PO TABS: 2.5000 mg | ORAL_TABLET | Freq: Four times a day (QID) | ORAL | 0 refills | Status: DC | PRN

## 2022-04-06 NOTE — Telephone Encounter (Signed)
Patient called and requested to reschedule her appointment from 4/21 to 4/28. She stated that she will be watching her granddaughter and cannot come in until that day.  ? ?Routing to clinical team to make aware as patient is scheduled for Avastin as well.  ? ?

## 2022-04-08 DIAGNOSIS — M5459 Other low back pain: Secondary | ICD-10-CM | POA: Diagnosis not present

## 2022-04-08 DIAGNOSIS — M5416 Radiculopathy, lumbar region: Secondary | ICD-10-CM | POA: Diagnosis not present

## 2022-04-11 ENCOUNTER — Encounter: Payer: Self-pay | Admitting: Internal Medicine

## 2022-04-11 ENCOUNTER — Other Ambulatory Visit: Payer: Self-pay

## 2022-04-11 ENCOUNTER — Ambulatory Visit
Admission: RE | Admit: 2022-04-11 | Discharge: 2022-04-11 | Disposition: A | Discharge status: Home / Self Care | Payer: Medicaid Other | Source: Ambulatory Visit | Attending: Hospice and Palliative Medicine | Admitting: Hospice and Palliative Medicine

## 2022-04-11 ENCOUNTER — Ambulatory Visit
Admission: RE | Admit: 2022-04-11 | Discharge: 2022-04-11 | Disposition: A | Discharge status: Home / Self Care | Payer: Medicaid Other | Attending: Hospice and Palliative Medicine | Admitting: Hospice and Palliative Medicine

## 2022-04-11 ENCOUNTER — Inpatient Hospital Stay: Payer: Medicaid Other | Attending: Internal Medicine | Admitting: Medical Oncology

## 2022-04-11 VITALS — BP 112/84 | HR 78 | Temp 98.2°F | Resp 16

## 2022-04-11 DIAGNOSIS — C482 Malignant neoplasm of peritoneum, unspecified: Secondary | ICD-10-CM

## 2022-04-11 DIAGNOSIS — R1084 Generalized abdominal pain: Secondary | ICD-10-CM | POA: Diagnosis not present

## 2022-04-11 DIAGNOSIS — R109 Unspecified abdominal pain: Secondary | ICD-10-CM | POA: Diagnosis not present

## 2022-04-11 DIAGNOSIS — K5900 Constipation, unspecified: Secondary | ICD-10-CM | POA: Diagnosis not present

## 2022-04-11 DIAGNOSIS — Z5112 Encounter for antineoplastic immunotherapy: Secondary | ICD-10-CM | POA: Diagnosis not present

## 2022-04-11 NOTE — Progress Notes (Signed)
? ?Symptom Management Clinic ?[place] [facility] ?[phone] [phone] ? ?Patient Care Team: ?Steele Sizer, MD as PCP - General (Family Medicine) ?Kate Sable, MD as PCP - Cardiology (Cardiology) ?Clent Jacks, RN as Oncology Nurse Navigator ?Borders, [name], NP as Nurse Practitioner [facility] and Palliative Medicine) ?Cammie Sickle, MD as Consulting Physician (Internal Medicine) ?Gillis Ends, MD as Referring Physician (Obstetrics) ?Robert Bellow, MD as Consulting Physician (General Surgery)  ? ?Name of the patient: [name]  ?[id number]  ?15-Mar-1971  ? ?Date of visit: 04/11/22 ? ?Reason for Consult: ?CHARLITA BRIAN is a 51 y.o. female who presents today for: ? ?Constipation: Pt reports that since being on chemotherapy she has struggled with constipation. Normally has a bowel movement every 1-2 weeks. Firm and non-bloody. Now for the past 3 weeks she has had trouble passing any stool but hard pellet like stools. Feels some nausea when she eats. She has tried one double dose of miralax, milk of magnesia x 4, Doculax suppositories twice all with little improvement. She denies fevers, significant abdominal pain, bloody stools, urinary symptoms, vomiting.  ? ? ?PAST MEDICAL HISTORY: ?Past Medical History:  ?Diagnosis Date  ? 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 Pathogenic BRCA1 mutation at [facility]  ? Cancer associated pain   ? Cancer of bronchus of right upper lobe ([place]) 12/11/2019  ? Clotting disorder ([place])   ? Right arm blood clot when she started Chemo.  ? Depression   ? Drug-induced androgenic alopecia   ? Dysrhythmia   ? Family history of breast cancer   ? GERD (gastroesophageal reflux disease)   ? Hypertension   ? Menorrhagia   ? Migraines   ? Osteoarthritis   ? back  ? Ovarian cancer ([place]) 12/10/2019  ? Personal history of chemotherapy   ? ovarian cancer  ? Plantar fasciitis   ? ? ?PAST SURGICAL HISTORY:  ?Past Surgical  History:  ?Procedure Laterality Date  ? ABDOMINAL HYSTERECTOMY  03/2020  ? APPENDECTOMY    ? [place] but "ruptured when they did the surgery"  ? BREAST BIOPSY Left 01/04/2021  ? MRI BX  ? CESAREAN SECTION    ? CYSTOSCOPY N/A 04/08/2020  ? Procedure: CYSTOSCOPY;  [name], MD;  [name];  Service: Gynecology;  [name];  ? INSERTION OF MESH N/A 04/26/2021  ? Procedure: INSERTION OF MESH;  [name], MD;  [name];  Service: General;  [name];  ? IR THORACENTESIS ASP PLEURAL SPACE W/IMG GUIDE  12/06/2019  ? IUD REMOVAL N/A 04/08/2020  ? Procedure: INTRAUTERINE DEVICE (IUD) REMOVAL;  [name], MD;  [name];  Service: Gynecology;  [name];  ? PARACENTESIS    ? x6  ? PORTA CATH INSERTION N/A 04/23/2020  ? Procedure: PORTA CATH INSERTION;  [name], MD;  Location: [place] CV LAB;  Service: Cardiovascular;  [name];  ? TUBAL LIGATION    ? at time of CSxn  ? VENTRAL HERNIA REPAIR N/A 04/26/2021  ? Procedure: HERNIA REPAIR VENTRAL ADULT;  [name], MD;  [name];  Service: General;  [name];  need RNFA for the case  ? WRIST SURGERY Left 11/21/2016  ? plates and screws inserted  ? ? ?HEMATOLOGY/ONCOLOGY HISTORY:  ?Oncology History Overview Note  ?# DEC 2020- [place] CA [s/p Pleural effusion]; [name]; [name]; abdominal  ascites status post paracentesis x2; adenocarcinoma; [name]; omental caking/peritoneal disease/no obvious evidence of bowel involvement; no adnexal masses readily noted; Ca 125-8000.  ? ?# 12/23/2019- Carbo-Taxol #1; Jan 18 th 2021- #2 carbo-Taxol-Bev status post 4 cycles-April 08, 2020-debulking surgery [Dr. Secord] miliary disease noted post surgery. Carbo-Taxol-Avastin x6 ? ?# July 6th  2021- Avastin q 3 W+ OLAPARIB 300 mg BID ? ?# OCT 26th, 2021-recurrent anemia [hemoglobin 7.5]; HELD Olaparib ? ?# DEC 9th 2021- olaparib to 250 BID; [name], [name]; [name]; HOLD AVASTIN [last 2/11]sec to upcoming hernia repair ? ?# June 20th, 2022 ~restart olaparib 200 mg twice daily.  ? ?#December 2021 screening breast MRI-left breast 9 mm lesion biopsy; apocrine metaplasia/benign; annual MRI.  ? ?# Jan 15th 2021- L UE SVTxarelto; [name]; [name]; mid May 2021-Xarelto 10 mg a day/prophylaxis ? ?# BRCA-1 [on screening; s/p genetics counseling; [name]; July 2019- 2-3cm-right complex ovarian cyst- likely benign/hemorrhagic [also 2011]. ? ?# # NGS/MOLECULAR TESTS:P ? ? ? ?# PALLIATIVE CARE EVALUATION:P ? ?# PAIN MANAGEMENT: NA ? 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 ?CURRENT/MOST RECENT THERAPY : Avastin maintenace ? ?  ?Primary peritoneal carcinomatosis ([facility])  ?12/16/2019 Initial Diagnosis  ? Primary peritoneal adenocarcinoma ([facility]) ? ?  ?12/23/2019 -  Chemotherapy  ? Patient is on Treatment Plan : Carboplatin + Paclitaxel + Mvasi q21d  ? ?  ?  ?01/07/2021 Cancer Staging  ? Staging form: Ovary, Fallopian Tube, and Primary Peritoneal Carcinoma, AJCC 8th Edition ?- Clinical: Stage IVA (pM1a) - Signed by Brahmanday, Govinda R, MD on 01/07/2021 ? ?  ? ? ?ALLERGIES:  has No Known Allergies. ? ?MEDICATIONS:  ?Current Outpatient Medications  ?Medication Sig Dispense Refill  ? chlorpheniramine-HYDROcodone 10-8 MG/5ML Take 5 mLs by mouth at bedtime as needed for cough. 115 mL 0  ? cloNIDine (CATAPRES - DOSED IN MG/24 HR) 0.3 mg/24hr patch Place 1 patch (0.3 mg total) onto the skin once a week. 12 patch 3  ? cloNIDine (CATAPRES) 0.1 MG tablet Take 1 tablet (0.1 mg total) by mouth at bedtime. 90 tablet 3  ? clotrimazole (LOTRIMIN) 1 % cream Apply 1 application.  topically 2 (two) times daily. 30 g 0  ? DULoxetine (CYMBALTA) 60 MG capsule Take 2 capsules (120 mg total) by mouth daily. 180 capsule 1  ? eszopiclone (LUNESTA) 2 MG TABS tablet Take 1 tablet (2 mg total) by mouth at bedtime as needed for sleep. Take immediately before bedtime 30 tablet 2  ? hydrOXYzine (ATARAX/VISTARIL) 10 MG tablet Take 1 tablet (10 mg total) by mouth 3 (three) times daily as needed. 30 tablet 0  ? lidocaine-prilocaine (EMLA) cream Apply 1 application topically as needed. (Patient taking differently: Apply 1 application. topically as needed (Port).) 30 g 3  ? loratadine (CLARITIN) 10 MG tablet TAKE 1 TABLET(10 MG) BY MOUTH EVERY MORNING 90 tablet 3  ? LORazepam (ATIVAN) 0.5 MG tablet Take 1 tablet (0.5 mg total) by mouth every 8 (eight) hours as needed for anxiety. 60 tablet 0  ? metoprolol succinate (TOPROL-XL) 25 MG 24 hr tablet Take 0.5 tablets (12.5 mg total) by mouth daily. 45 tablet 1  ? Multiple Vitamin (MULTIVITAMIN WITH MINERALS) TABS tablet Take 1 tablet by mouth daily.    ? nystatin (MYCOSTATIN/NYSTOP) powder Apply 1 application. topically 3 (three) times daily.   15 g 0  ? olaparib (LYNPARZA) 100 MG tablet Take 2 tablets (200 mg total) by mouth 2 (two) times daily. Swallow whole. May take with food to decrease nausea and vomiting. 120 tablet 3  ? omeprazole (PRILOSEC) 20 MG capsule TAKE 1 CAPSULE(20 MG) BY MOUTH DAILY 90 capsule 0  ? ondansetron (ZOFRAN) 8 MG tablet One pill every 8 hours as needed for nausea/vomitting. 40 tablet 1  ? ondansetron (ZOFRAN-ODT) 8 MG disintegrating tablet Take 1 tablet (8 mg total) by mouth every 8 (eight) hours as needed for nausea or vomiting. 30 tablet 3  ? ondansetron (ZOFRAN-ODT) 8 MG disintegrating tablet Take 1 tablet (8 mg total) by mouth every 8 (eight) hours as needed for nausea or vomiting. 30 tablet 3  ? oxyCODONE (OXY IR/ROXICODONE) 5 MG immediate release tablet Take 0.5-1 tablets (2.5-5 mg total) by mouth every 6 (six) hours as needed for  severe pain. 60 tablet 0  ? polyethylene glycol powder (GLYCOLAX/MIRALAX) 17 GM/SCOOP powder Take 0.5 Containers by mouth daily as needed for mild constipation or moderate constipation.    ? pregabalin (LYRICA)

## 2022-04-11 NOTE — Telephone Encounter (Signed)
Pt returned my phone call and is agreeable to obtaining imaging and to be seen in [facility] today. Appointment scheduled.  ?

## 2022-04-11 NOTE — Telephone Encounter (Signed)
Attempted to contact patient re: recommendations. Left message on voicemail and responded via MyChart. Awaiting response and/or return call.  ?

## 2022-04-11 NOTE — Progress Notes (Signed)
Pt concerned that she is constipated/impacted due to results seen on x-ray last week by her back Dr. She states that she has not had a BM in 3 weeks, but did have a small BM this morning. States that she has tried milk of magnesia, miralax, and suppositories. Endorses intermittent nausea for the past week, but no vomiting.  ?

## 2022-04-11 NOTE — Patient Instructions (Signed)
CT scan planned  ? ?Miralax twice daily ?Sennacot once daily- adding on one extra tablet per day as needed up to a maximum of 8 tablets per day ?OTC magnesium supplement ?Hydration with water ? ?Should you have fever, worsening symptoms or pain please go to the emergency room  ?

## 2022-04-12 ENCOUNTER — Encounter: Payer: Self-pay | Admitting: Internal Medicine

## 2022-04-12 ENCOUNTER — Telehealth: Payer: Self-pay

## 2022-04-12 NOTE — Telephone Encounter (Signed)
Left message for patient to return my call regarding the importance of having her CT asap rather than waiting. ?

## 2022-04-13 ENCOUNTER — Encounter: Payer: Self-pay | Admitting: Licensed Clinical Social Worker

## 2022-04-13 ENCOUNTER — Ambulatory Visit: Payer: Medicaid Other

## 2022-04-13 NOTE — Progress Notes (Signed)
[place] Clinical Social Work  ?Initial Assessment ? ? ?[name] is a 51 y.o. year old female contacted by phone. Clinical Social Work was referred by nurse navigator for assessment of psychosocial needs.  ? ?SDOH (Social Determinants of Health) assessments performed: Yes ?SDOH Interventions   ? ?Flowsheet Row Most Recent Value  ?SDOH Interventions   ?Food Insecurity Interventions Intervention Not Indicated  ?Financial Strain Interventions Intervention Not Indicated  ?Housing Interventions Intervention Not Indicated, Other (Comment)  [Patient currently lives with daughter and her family and she stated the move has been difficult.]  ?Intimate Partner Violence Interventions Intervention Not Indicated  ?Physical Activity Interventions Intervention Not Indicated  ?Stress Interventions Intervention Not Indicated, Provide Counseling, Other (Comment)  [Patient currently looking for new living situation and will contact housing authority to apply for public housing.]  ?Social Connections Interventions Intervention Not Indicated  ?Transportation Interventions Intervention Not Indicated, Patient Resources (Friends/Family)  ?Depression Interventions/Treatment  Counseling  ? ?  ?  ?Distress Screen completed: No ? ?  12/18/2019  ?  2:37 PM  ?ONCBCN DISTRESS SCREENING  ?Screening Type Initial Screening  ?Distress experienced in past week (1-10) 8  ?Information Concerns Type Lack of info about treatment  ? ? ? ? ?Family/Social Information:  ?Housing Arrangement: patient lives with daughter,  and son-in-law and grandchildren. ?Family members/support persons in your life? Family ?Transportation concerns: no  ?Employment: Legally disabled. Income source: Social Security Disability ?Financial concerns: Yes, current concerns ?Type of concern:  fixed income ?Food access concerns: no ?Religious or spiritual practice: no ?Services Currently in place:  Disability and Medicaid ? ?Coping/ Adjustment to diagnosis: ?Patient understands  treatment plan and what happens next? yes ?Concerns about diagnosis and/or treatment: Feelings of anger or sadness and relationship with family ?Patient reported stressors: Housing, Finances, Children, Depression, Anxiety, Adjusting to my illness, Isolation/ feeling alone, and Feeling hopeless ?Hopes and priorities: to find another living situation, patient will contact housing authority and apply for public housing ?Patient enjoys watching TV ?Current coping skills/ strengths: Capable of independent living  and Communication skills  ? ? ? SUMMARY: ?Current SDOH Barriers:  ?Financial constraints related to fixed income, Housing barriers, Social Isolation, and difficulty with current living situation. ? ?Clinical Social Work Clinical Goal(s):  ?patient will work with housing authority to address needs related to public housing application. ? ?Interventions: ?Discussed common feeling and emotions when being diagnosed with cancer, and the importance of support during treatment ?Informed patient of the support team roles and support services at [facility] ?Provided CSW contact information and encouraged patient to call with any questions or concerns ?Provided patient with information about CSW role in patient care and available resources. CSW wills end email to fund manager to contact patient who filled out applications. ? ? ?Follow Up Plan: Patient will contact CSW with any support or resource needs ?Patient verbalizes understanding of plan: Yes ? ? ? ?Jacora Hopkins, LCSW ?

## 2022-04-14 ENCOUNTER — Ambulatory Visit
Admission: RE | Admit: 2022-04-14 | Discharge: 2022-04-14 | Disposition: A | Discharge status: Home / Self Care | Payer: Medicaid Other | Source: Ambulatory Visit | Attending: Medical Oncology | Admitting: Medical Oncology

## 2022-04-14 DIAGNOSIS — K5900 Constipation, unspecified: Secondary | ICD-10-CM | POA: Diagnosis not present

## 2022-04-14 DIAGNOSIS — I878 Other specified disorders of veins: Secondary | ICD-10-CM | POA: Diagnosis not present

## 2022-04-14 DIAGNOSIS — C482 Malignant neoplasm of peritoneum, unspecified: Secondary | ICD-10-CM | POA: Insufficient documentation

## 2022-04-14 DIAGNOSIS — R1084 Generalized abdominal pain: Secondary | ICD-10-CM | POA: Diagnosis not present

## 2022-04-14 DIAGNOSIS — Z8543 Personal history of malignant neoplasm of ovary: Secondary | ICD-10-CM | POA: Diagnosis not present

## 2022-04-14 DIAGNOSIS — K432 Incisional hernia without obstruction or gangrene: Secondary | ICD-10-CM | POA: Diagnosis not present

## 2022-04-14 MED ORDER — IOHEXOL 300 MG/ML  SOLN
100.0000 mL | Freq: Once | INTRAMUSCULAR | Status: AC | PRN
  Administered 2022-04-14: 100 mL via INTRAVENOUS

## 2022-04-15 ENCOUNTER — Ambulatory Visit: Payer: Medicaid Other | Admitting: Internal Medicine

## 2022-04-15 ENCOUNTER — Other Ambulatory Visit: Payer: Medicaid Other

## 2022-04-15 ENCOUNTER — Other Ambulatory Visit: Payer: Self-pay | Admitting: Family Medicine

## 2022-04-15 ENCOUNTER — Ambulatory Visit: Payer: Medicaid Other

## 2022-04-15 DIAGNOSIS — R000 Tachycardia, unspecified: Secondary | ICD-10-CM

## 2022-04-15 DIAGNOSIS — G43109 Migraine with aura, not intractable, without status migrainosus: Secondary | ICD-10-CM

## 2022-04-18 DIAGNOSIS — Z6841 Body Mass Index (BMI) 40.0 and over, adult: Secondary | ICD-10-CM | POA: Diagnosis not present

## 2022-04-19 ENCOUNTER — Other Ambulatory Visit (HOSPITAL_COMMUNITY): Payer: Self-pay

## 2022-04-19 ENCOUNTER — Telehealth: Payer: Self-pay | Admitting: Medical Oncology

## 2022-04-19 NOTE — Telephone Encounter (Signed)
Reviewed CT scan results with patient. I apologized as I had dictated on the image but it appears to not have saved. Discussed results. She is feeling better and is now having regular bowel movements. She will continue her normally scheduled follow up routine.  ?

## 2022-04-22 ENCOUNTER — Encounter: Payer: Self-pay | Admitting: Internal Medicine

## 2022-04-22 ENCOUNTER — Inpatient Hospital Stay: Payer: Medicaid Other

## 2022-04-22 ENCOUNTER — Inpatient Hospital Stay (HOSPITAL_BASED_OUTPATIENT_CLINIC_OR_DEPARTMENT_OTHER): Payer: Medicaid Other | Admitting: Internal Medicine

## 2022-04-22 DIAGNOSIS — C482 Malignant neoplasm of peritoneum, unspecified: Secondary | ICD-10-CM

## 2022-04-22 DIAGNOSIS — M5116 Intervertebral disc disorders with radiculopathy, lumbar region: Secondary | ICD-10-CM | POA: Diagnosis not present

## 2022-04-22 DIAGNOSIS — Z7189 Other specified counseling: Secondary | ICD-10-CM

## 2022-04-22 DIAGNOSIS — M5416 Radiculopathy, lumbar region: Secondary | ICD-10-CM | POA: Diagnosis not present

## 2022-04-22 DIAGNOSIS — M4727 Other spondylosis with radiculopathy, lumbosacral region: Secondary | ICD-10-CM | POA: Diagnosis not present

## 2022-04-22 DIAGNOSIS — K5900 Constipation, unspecified: Secondary | ICD-10-CM | POA: Diagnosis not present

## 2022-04-22 DIAGNOSIS — M4726 Other spondylosis with radiculopathy, lumbar region: Secondary | ICD-10-CM | POA: Diagnosis not present

## 2022-04-22 DIAGNOSIS — M48061 Spinal stenosis, lumbar region without neurogenic claudication: Secondary | ICD-10-CM | POA: Diagnosis not present

## 2022-04-22 DIAGNOSIS — Z5112 Encounter for antineoplastic immunotherapy: Secondary | ICD-10-CM | POA: Diagnosis not present

## 2022-04-22 LAB — URINALYSIS, COMPLETE (UACMP) WITH MICROSCOPIC
Bacteria, UA: NONE SEEN
Bilirubin Urine: NEGATIVE
Glucose, UA: NEGATIVE mg/dL
Hgb urine dipstick: NEGATIVE
Ketones, ur: NEGATIVE mg/dL
Leukocytes,Ua: NEGATIVE
Nitrite: NEGATIVE
Protein, ur: 30 mg/dL — AB
Specific Gravity, Urine: 1.025 (ref 1.005–1.030)
pH: 5 (ref 5.0–8.0)

## 2022-04-22 LAB — COMPREHENSIVE METABOLIC PANEL
ALT: 19 U/L (ref 0–44)
AST: 28 U/L (ref 15–41)
Albumin: 4.1 g/dL (ref 3.5–5.0)
Alkaline Phosphatase: 58 U/L (ref 38–126)
Anion gap: 6 (ref 5–15)
BUN: 13 mg/dL (ref 6–20)
CO2: 26 mmol/L (ref 22–32)
Calcium: 9.3 mg/dL (ref 8.9–10.3)
Chloride: 102 mmol/L (ref 98–111)
Creatinine, Ser: 0.79 mg/dL (ref 0.44–1.00)
GFR, Estimated: >60 mL/min (ref >60)
Glucose, Bld: 131 mg/dL — ABNORMAL HIGH (ref 70–99)
Potassium: 3.6 mmol/L (ref 3.5–5.1)
Sodium: 134 mmol/L — ABNORMAL LOW (ref 135–145)
Total Bilirubin: 0.9 mg/dL (ref 0.3–1.2)
Total Protein: 7.4 g/dL (ref 6.5–8.1)

## 2022-04-22 LAB — CBC WITH DIFFERENTIAL/PLATELET
Abs Immature Granulocytes: 0.02 10*3/uL (ref 0.00–0.07)
Basophils Absolute: 0.1 10*3/uL (ref 0.0–0.1)
Basophils Relative: 1 %
Eosinophils Absolute: 0.1 10*3/uL (ref 0.0–0.5)
Eosinophils Relative: 1 %
HCT: 44.7 % (ref 36.0–46.0)
Hemoglobin: 15.6 g/dL — ABNORMAL HIGH (ref 12.0–15.0)
Immature Granulocytes: 0 %
Lymphocytes Relative: 29 %
Lymphs Abs: 2 10*3/uL (ref 0.7–4.0)
MCH: 40.2 pg — ABNORMAL HIGH (ref 26.0–34.0)
MCHC: 34.9 g/dL (ref 30.0–36.0)
MCV: 115.2 fL — ABNORMAL HIGH (ref 80.0–100.0)
Monocytes Absolute: 0.4 10*3/uL (ref 0.1–1.0)
Monocytes Relative: 6 %
Neutro Abs: 4.2 10*3/uL (ref 1.7–7.7)
Neutrophils Relative %: 63 %
Platelets: 134 10*3/uL — ABNORMAL LOW (ref 150–400)
RBC: 3.88 MIL/uL (ref 3.87–5.11)
RDW: 15.5 % (ref 11.5–15.5)
WBC: 6.7 10*3/uL (ref 4.0–10.5)
nRBC: 0 % (ref 0.0–0.2)

## 2022-04-22 LAB — PROTEIN / CREATININE RATIO, URINE
Creatinine, Urine: 209 mg/dL
Protein Creatinine Ratio: 0.12 mg/mg{Cre} (ref 0.00–0.15)
Total Protein, Urine: 25 mg/dL

## 2022-04-22 MED ORDER — SODIUM CHLORIDE 0.9 % IV SOLN
Freq: Once | INTRAVENOUS | Status: AC
  Filled 2022-04-22: qty 250

## 2022-04-22 MED ORDER — HEPARIN SOD (PORK) LOCK FLUSH 100 UNIT/ML IV SOLN
500.0000 [IU] | Freq: Once | INTRAVENOUS | Status: AC | PRN
  Administered 2022-04-22: 500 [IU]
  Filled 2022-04-22: qty 5

## 2022-04-22 MED ORDER — SODIUM CHLORIDE 0.9 % IV SOLN
15.0000 mg/kg | Freq: Once | INTRAVENOUS | Status: AC
  Administered 2022-04-22: 1700 mg via INTRAVENOUS
  Filled 2022-04-22: qty 64

## 2022-04-22 NOTE — Assessment & Plan Note (Addendum)
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 STABLE; tumor markers- improving.  Currently off bevacizumab [last infusion 02/05/2021-secondary to ventral hernia surgery]. On avastin + lynparza-STABLE.  CT April 2023- NED. ? ?# Proceed with avastin today; CBC- platelets >100; CMP are reviewed; adequate today.  Mild proteinuria monitor for now.  Continue Lynparza 200 mg twice a day [severe anemia while on 250 mg twice a day].  ? ?# Recent episode of abdominal pain-CT scan abdomen pelvis no evidence of any recurrent disease; noted to have constipation.- currently improved.  Monitor closely. ? ?# Abdominal hernia surgery- recurrent - s/p re-evaluation at [facility]; will need to weight loss/quit smoking [weight loss clinic-expensive]; defer to weight loss clinic regarding Ozempic prescription. ? ?# Hypertension: Blood pressure 140/94  Monitor closely on Avastin.  Repeat systolic 163A.  Continue metoprolol/clonidine hot flashes.STABLE.  ? 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  ? ?# Nausea- continue Zofran/ compazine- STABLE.  ? ?#IV access/Mediport-stable/port flush ? ?# DISPOSITION: afternoon- Tuesday/mychart ?# avastin today ?# in 3 weeks - MD;   Avastin; labs-  UA; cbc/cmp; ca-125; urin random protein:creatiine ratio- Dr.B ?

## 2022-04-22 NOTE — Progress Notes (Signed)
Patient has started OTC magnesium 400 mg BID and is helping get her bowel movements regular. ?
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
## 2022-04-22 NOTE — Patient Instructions (Signed)
[facility] CANCER CTR AT [place]  Discharge Instructions: ?Thank you for choosing [facility] to provide your oncology and hematology care.  ?If you have a lab appointment with the [facility], please go directly to the [facility] and check in at the registration area. ? ?Wear comfortable clothing and clothing appropriate for easy access to any Portacath or PICC line.  ? ?We strive to give you quality time with your provider. You may need to reschedule your appointment if you arrive late (15 or more minutes).  Arriving late affects you and other patients whose appointments are after yours.  Also, if you miss three or more appointments without notifying the office, you may be dismissed from the clinic at the provider?s discretion.    ?  ?For prescription refill requests, have your pharmacy contact our office and allow 72 hours for refills to be completed.   ? ?Today you received the following chemotherapy and/or immunotherapy agents MVASI    ?  ?To help prevent nausea and vomiting after your treatment, we encourage you to take your nausea medication as directed. ? ?BELOW ARE SYMPTOMS THAT SHOULD BE REPORTED IMMEDIATELY: ?*FEVER GREATER THAN 100.4 F (38 ?C) OR HIGHER ?*CHILLS OR SWEATING ?*NAUSEA AND VOMITING THAT IS NOT CONTROLLED WITH YOUR NAUSEA MEDICATION ?*UNUSUAL SHORTNESS OF BREATH ?*UNUSUAL BRUISING OR BLEEDING ?*URINARY PROBLEMS (pain or burning when urinating, or frequent urination) ?*BOWEL PROBLEMS (unusual diarrhea, constipation, pain near the anus) ?TENDERNESS IN MOUTH AND THROAT WITH OR WITHOUT PRESENCE OF ULCERS (sore throat, sores in mouth, or a toothache) ?UNUSUAL RASH, SWELLING OR PAIN  ?UNUSUAL VAGINAL DISCHARGE OR ITCHING  ? ?Items with * indicate a potential emergency and should be followed up as soon as possible or go to the Emergency Department if any problems should occur. ? ?Please show the CHEMOTHERAPY ALERT CARD or IMMUNOTHERAPY ALERT CARD at check-in to the  Emergency Department and triage nurse. ? ?Should you have questions after your visit or need to cancel or reschedule your appointment, please contact [facility] CANCER [place] AT [place]  [phone] and follow the prompts.  Office hours are 8:00 a.m. to 4:30 p.m. Monday - Friday. Please note that voicemails left after 4:00 p.m. may not be returned until the following business day.  We are closed weekends and major holidays. You have access to a nurse at all times for urgent questions. Please call the main number to the clinic [phone] and follow the prompts. ? ?For any non-urgent questions, you may also contact your provider using MyChart. We now offer e-Visits for anyone 45 and older to request care online for non-urgent symptoms. For details visit [email or web address]. ?  ?Also download the MyChart app! Go to the app store, search "MyChart", open the app, select [facility], and log in with your MyChart username and password. ? ?Due to Covid, a mask is required upon entering the hospital/clinic. If you do not have a mask, one will be given to you upon arrival. For doctor visits, patients may have 1 support person aged 42 or older with them. For treatment visits, patients cannot have anyone with them due to current Covid guidelines and our immunocompromised population.  ? ? ?

## 2022-04-22 NOTE — Progress Notes (Signed)
Patient approved one-time $1000 [place] and qualifications to assist with personal expenses while undergoing treatment. ? ?Patient provided [facility] agreement.  Email and letter sent to patient with information on expenses and provided patient my contact information for any additional questions.  ?

## 2022-04-23 LAB — CA 125: Cancer Antigen (CA) 125: 7.6 U/mL (ref 0.0–38.1)

## 2022-04-25 ENCOUNTER — Telehealth: Payer: Self-pay

## 2022-04-25 DIAGNOSIS — M5416 Radiculopathy, lumbar region: Secondary | ICD-10-CM | POA: Diagnosis not present

## 2022-04-25 NOTE — Telephone Encounter (Signed)
Prior authorization for Oxycodone submitted via Cover My Meds (Key: [id number]) ?

## 2022-04-25 NOTE — Telephone Encounter (Signed)
PA Case: [id number], Status: Approved, Coverage Starts on: 04/25/2022 12:00:00 AM, Coverage Ends on: 10/22/2022 12:00:00 AM ?

## 2022-04-27 ENCOUNTER — Inpatient Hospital Stay: Payer: Medicaid Other | Attending: Hospice and Palliative Medicine | Admitting: Hospice and Palliative Medicine

## 2022-04-27 DIAGNOSIS — F17210 Nicotine dependence, cigarettes, uncomplicated: Secondary | ICD-10-CM | POA: Insufficient documentation

## 2022-04-27 DIAGNOSIS — U071 COVID-19: Secondary | ICD-10-CM | POA: Diagnosis not present

## 2022-04-27 DIAGNOSIS — R82998 Other abnormal findings in urine: Secondary | ICD-10-CM | POA: Insufficient documentation

## 2022-04-27 DIAGNOSIS — M549 Dorsalgia, unspecified: Secondary | ICD-10-CM | POA: Insufficient documentation

## 2022-04-27 DIAGNOSIS — Z515 Encounter for palliative care: Secondary | ICD-10-CM | POA: Diagnosis not present

## 2022-04-27 DIAGNOSIS — C482 Malignant neoplasm of peritoneum, unspecified: Secondary | ICD-10-CM | POA: Insufficient documentation

## 2022-04-27 DIAGNOSIS — R809 Proteinuria, unspecified: Secondary | ICD-10-CM | POA: Insufficient documentation

## 2022-04-27 DIAGNOSIS — Z79899 Other long term (current) drug therapy: Secondary | ICD-10-CM | POA: Insufficient documentation

## 2022-04-27 DIAGNOSIS — N23 Unspecified renal colic: Secondary | ICD-10-CM | POA: Insufficient documentation

## 2022-04-27 DIAGNOSIS — I10 Essential (primary) hypertension: Secondary | ICD-10-CM | POA: Insufficient documentation

## 2022-04-27 DIAGNOSIS — R110 Nausea: Secondary | ICD-10-CM | POA: Insufficient documentation

## 2022-04-27 MED ORDER — MOLNUPIRAVIR EUA 200MG CAPSULE: 4.0000 | ORAL_CAPSULE | Freq: Two times a day (BID) | ORAL | 0 refills | Status: AC

## 2022-04-27 NOTE — Addendum Note (Signed)
Addended by: Altha Harm R on: 04/27/2022 01:43 PM ? ? Modules accepted: Orders, Level of Service ? ?

## 2022-04-27 NOTE — Progress Notes (Addendum)
Virtual Visit via Telephone Note ? ?I connected with [name] on 04/27/22 at 10:30 AM EDT by telephone and verified that I am speaking with the correct person using two identifiers. ? ?Location: ?Patient: Home ?Provider: Clinic ?  ?I discussed the limitations, risks, security and privacy concerns of performing an evaluation and management service by telephone and the availability of in person appointments. I also discussed with the patient that there may be a patient responsible charge related to this service. The patient expressed understanding and agreed to proceed. ? ? ?History of Present Illness: ?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  Patient is also status post neoadjuvant carbo/Taxol on maintenance Avastin/Lynparza.  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  Patient has also had chronic abdominal pain.  She was referred to palliative care to help address goals and manage ongoing symptoms. ?  ?Observations/Objective: ?Routine follow-up.   ? ?Patient reports that her daughter and grandchild tested positive for COVID this week.  Last night, patient started having nasal congestion/nonproductive cough/sore throat and also tested positive for COVID this morning.  She denies fever or chills.  No shortness of breath.  She had COVID last year and tolerated molnupiravir with good outcome.  Patient remains high risk given immunosuppression from chemotherapy.  We will start on treatment with molnupiravir.  Discussed symptomatic management. ? ?Pain is stable.  Patient had some difficulty getting oxycodone filled from her pharmacy but was able  to get it last week.  She has had some recent opioid-induced constipation but that is resolved. ? ?Patient requested a letter outlining that she is unable to watch her grandchild due to active COVID infection ? ?Assessment and Plan: ?Stage IV serous adenocarcinoma -on systemic chemotherapy.  Stable tumor markers.  Followed by Dr. Rogue Bussing ? ?COVID -start molnupiravir ?  ?Neoplasm related pain-continue oxycodone ? ?Insomnia -continue Lunesta ? ?Follow Up Instructions: ?Follow-up MyChart visit 2 months ?  ?I discussed the assessment and treatment plan with the patient. The patient was provided an opportunity to ask questions and all were answered. The patient agreed with the plan and demonstrated an understanding of the instructions. ?  ?The patient was advised to call back or seek an in-person evaluation if the symptoms worsen or if the condition fails to improve as anticipated. ? ?I provided 10 minutes of non-face-to-face time during this encounter. ? ? ?Irean Hong, NP ? ? ? ?

## 2022-04-28 ENCOUNTER — Other Ambulatory Visit (HOSPITAL_COMMUNITY): Payer: Self-pay

## 2022-04-28 ENCOUNTER — Other Ambulatory Visit: Payer: Self-pay | Admitting: Internal Medicine

## 2022-04-28 DIAGNOSIS — C482 Malignant neoplasm of peritoneum, unspecified: Secondary | ICD-10-CM

## 2022-05-02 ENCOUNTER — Other Ambulatory Visit (HOSPITAL_COMMUNITY): Payer: Self-pay

## 2022-05-02 ENCOUNTER — Other Ambulatory Visit: Payer: Self-pay | Admitting: Internal Medicine

## 2022-05-02 ENCOUNTER — Encounter: Payer: Self-pay | Admitting: Internal Medicine

## 2022-05-02 MED ORDER — OLAPARIB 100 MG PO TABS
200.0000 mg | ORAL_TABLET | Freq: Two times a day (BID) | ORAL | 3 refills | Status: DC
  Filled 2022-05-02: qty 120, 30d supply, fill #0
  Filled 2022-06-29: qty 120, 30d supply, fill #1

## 2022-05-09 ENCOUNTER — Telehealth: Payer: Self-pay | Admitting: Internal Medicine

## 2022-05-09 NOTE — Telephone Encounter (Signed)
Noted  

## 2022-05-09 NOTE — Telephone Encounter (Signed)
Patient called and requested her appointment be rescheduled from 5/19 to 5/26 (out 1 week) due to being out of town. Patient is scheduled for Avastin this day, routing to clinical team to make aware.  ? ? ?

## 2022-05-13 ENCOUNTER — Ambulatory Visit: Payer: Medicaid Other

## 2022-05-13 ENCOUNTER — Ambulatory Visit: Payer: Medicaid Other | Admitting: Internal Medicine

## 2022-05-13 ENCOUNTER — Other Ambulatory Visit: Payer: Medicaid Other
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
## 2022-05-17 ENCOUNTER — Encounter: Payer: Self-pay | Admitting: Family Medicine

## 2022-05-17 ENCOUNTER — Ambulatory Visit (INDEPENDENT_AMBULATORY_CARE_PROVIDER_SITE_OTHER): Payer: Medicaid Other | Admitting: Family Medicine

## 2022-05-17 VITALS — BP 126/84 | HR 94 | Resp 16 | Ht 63.0 in | Wt 243.0 lb

## 2022-05-17 DIAGNOSIS — Z1501 Genetic susceptibility to malignant neoplasm of breast: Secondary | ICD-10-CM

## 2022-05-17 DIAGNOSIS — Z23 Encounter for immunization: Secondary | ICD-10-CM

## 2022-05-17 DIAGNOSIS — N393 Stress incontinence (female) (male): Secondary | ICD-10-CM | POA: Diagnosis not present

## 2022-05-17 DIAGNOSIS — Z1211 Encounter for screening for malignant neoplasm of colon: Secondary | ICD-10-CM

## 2022-05-17 DIAGNOSIS — R7989 Other specified abnormal findings of blood chemistry: Secondary | ICD-10-CM | POA: Diagnosis not present

## 2022-05-17 DIAGNOSIS — Z1509 Genetic susceptibility to other malignant neoplasm: Secondary | ICD-10-CM | POA: Diagnosis not present

## 2022-05-17 DIAGNOSIS — J410 Simple chronic bronchitis: Secondary | ICD-10-CM | POA: Diagnosis not present

## 2022-05-17 DIAGNOSIS — Z124 Encounter for screening for malignant neoplasm of cervix: Secondary | ICD-10-CM

## 2022-05-17 DIAGNOSIS — E785 Hyperlipidemia, unspecified: Secondary | ICD-10-CM | POA: Diagnosis not present

## 2022-05-17 DIAGNOSIS — Z122 Encounter for screening for malignant neoplasm of respiratory organs: Secondary | ICD-10-CM | POA: Diagnosis not present

## 2022-05-17 DIAGNOSIS — Z1231 Encounter for screening mammogram for malignant neoplasm of breast: Secondary | ICD-10-CM

## 2022-05-17 DIAGNOSIS — R7303 Prediabetes: Secondary | ICD-10-CM

## 2022-05-17 DIAGNOSIS — Z0000 Encounter for general adult medical examination without abnormal findings: Secondary | ICD-10-CM | POA: Diagnosis not present

## 2022-05-17 HISTORY — DX: Hyperlipidemia, unspecified: E78.5

## 2022-05-18 ENCOUNTER — Encounter: Payer: Self-pay | Admitting: Internal Medicine

## 2022-05-18 LAB — TSH: TSH: 2.77 u[IU]/mL (ref 0.450–4.500)

## 2022-05-18 LAB — LIPID PANEL
Chol/HDL Ratio: 4.3 ratio (ref 0.0–4.4)
Cholesterol, Total: 249 mg/dL — ABNORMAL HIGH (ref 100–199)
HDL: 58 mg/dL (ref >39)
LDL Chol Calc (NIH): 153 mg/dL — ABNORMAL HIGH (ref 0–99)
Triglycerides: 211 mg/dL — ABNORMAL HIGH (ref 0–149)
VLDL Cholesterol Cal: 38 mg/dL (ref 5–40)

## 2022-05-18 LAB — HEMOGLOBIN A1C
Est. average glucose Bld gHb Est-mCnc: 111 mg/dL
Hgb A1c MFr Bld: 5.5 % (ref 4.8–5.6)

## 2022-05-19 ENCOUNTER — Other Ambulatory Visit: Payer: Self-pay | Admitting: Hospice and Palliative Medicine

## 2022-05-20 ENCOUNTER — Inpatient Hospital Stay: Payer: Medicaid Other

## 2022-05-20 ENCOUNTER — Inpatient Hospital Stay (HOSPITAL_BASED_OUTPATIENT_CLINIC_OR_DEPARTMENT_OTHER): Payer: Medicaid Other | Admitting: Internal Medicine

## 2022-05-20 ENCOUNTER — Encounter: Payer: Self-pay | Admitting: Internal Medicine

## 2022-05-20 VITALS — BP 108/76 | HR 58 | Temp 96.8°F | Resp 16 | Wt 247.0 lb

## 2022-05-20 DIAGNOSIS — R809 Proteinuria, unspecified: Secondary | ICD-10-CM | POA: Diagnosis not present

## 2022-05-20 DIAGNOSIS — R82998 Other abnormal findings in urine: Secondary | ICD-10-CM | POA: Diagnosis not present

## 2022-05-20 DIAGNOSIS — C482 Malignant neoplasm of peritoneum, unspecified: Secondary | ICD-10-CM | POA: Diagnosis present

## 2022-05-20 DIAGNOSIS — N23 Unspecified renal colic: Secondary | ICD-10-CM | POA: Diagnosis not present

## 2022-05-20 DIAGNOSIS — I10 Essential (primary) hypertension: Secondary | ICD-10-CM | POA: Diagnosis not present

## 2022-05-20 DIAGNOSIS — R300 Dysuria: Secondary | ICD-10-CM | POA: Diagnosis not present

## 2022-05-20 DIAGNOSIS — M549 Dorsalgia, unspecified: Secondary | ICD-10-CM | POA: Diagnosis not present

## 2022-05-20 DIAGNOSIS — Z79899 Other long term (current) drug therapy: Secondary | ICD-10-CM | POA: Diagnosis not present

## 2022-05-20 DIAGNOSIS — F17210 Nicotine dependence, cigarettes, uncomplicated: Secondary | ICD-10-CM | POA: Diagnosis not present

## 2022-05-20 DIAGNOSIS — R110 Nausea: Secondary | ICD-10-CM | POA: Diagnosis not present

## 2022-05-20 LAB — URINALYSIS, COMPLETE (UACMP) WITH MICROSCOPIC
Bilirubin Urine: NEGATIVE
Bilirubin Urine: NEGATIVE
Glucose, UA: NEGATIVE mg/dL
Glucose, UA: NEGATIVE mg/dL
Ketones, ur: NEGATIVE mg/dL
Ketones, ur: NEGATIVE mg/dL
Nitrite: POSITIVE — AB
Nitrite: NEGATIVE
Protein, ur: 100 mg/dL — AB
Protein, ur: 100 mg/dL — AB
Specific Gravity, Urine: 1.02 (ref 1.005–1.030)
Specific Gravity, Urine: 1.021 (ref 1.005–1.030)
WBC, UA: >50 WBC/hpf — ABNORMAL HIGH (ref 0–5)
WBC, UA: >50 WBC/hpf — ABNORMAL HIGH (ref 0–5)
pH: 5 (ref 5.0–8.0)
pH: 5 (ref 5.0–8.0)

## 2022-05-20 LAB — COMPREHENSIVE METABOLIC PANEL
ALT: 17 U/L (ref 0–44)
AST: 19 U/L (ref 15–41)
Albumin: 3.7 g/dL (ref 3.5–5.0)
Alkaline Phosphatase: 50 U/L (ref 38–126)
Anion gap: 6 (ref 5–15)
BUN: 13 mg/dL (ref 6–20)
CO2: 27 mmol/L (ref 22–32)
Calcium: 9 mg/dL (ref 8.9–10.3)
Chloride: 103 mmol/L (ref 98–111)
Creatinine, Ser: 0.8 mg/dL (ref 0.44–1.00)
GFR, Estimated: >60 mL/min (ref >60)
Glucose, Bld: 108 mg/dL — ABNORMAL HIGH (ref 70–99)
Potassium: 3.9 mmol/L (ref 3.5–5.1)
Sodium: 136 mmol/L (ref 135–145)
Total Bilirubin: 0.8 mg/dL (ref 0.3–1.2)
Total Protein: 7 g/dL (ref 6.5–8.1)

## 2022-05-20 LAB — PROTEIN / CREATININE RATIO, URINE
Creatinine, Urine: 203 mg/dL
Protein Creatinine Ratio: 0.37 mg/mg{Cre} — ABNORMAL HIGH (ref 0.00–0.15)
Total Protein, Urine: 76 mg/dL

## 2022-05-20 LAB — CBC WITH DIFFERENTIAL/PLATELET
Abs Immature Granulocytes: 0.02 10*3/uL (ref 0.00–0.07)
Basophils Absolute: 0.1 10*3/uL (ref 0.0–0.1)
Basophils Relative: 1 %
Eosinophils Absolute: 0.1 10*3/uL (ref 0.0–0.5)
Eosinophils Relative: 1 %
HCT: 41.6 % (ref 36.0–46.0)
Hemoglobin: 14.6 g/dL (ref 12.0–15.0)
Immature Granulocytes: 0 %
Lymphocytes Relative: 25 %
Lymphs Abs: 1.5 10*3/uL (ref 0.7–4.0)
MCH: 40.4 pg — ABNORMAL HIGH (ref 26.0–34.0)
MCHC: 35.1 g/dL (ref 30.0–36.0)
MCV: 115.2 fL — ABNORMAL HIGH (ref 80.0–100.0)
Monocytes Absolute: 0.4 10*3/uL (ref 0.1–1.0)
Monocytes Relative: 7 %
Neutro Abs: 4 10*3/uL (ref 1.7–7.7)
Neutrophils Relative %: 66 %
Platelets: 122 10*3/uL — ABNORMAL LOW (ref 150–400)
RBC: 3.61 MIL/uL — ABNORMAL LOW (ref 3.87–5.11)
RDW: 15.5 % (ref 11.5–15.5)
WBC: 6.1 10*3/uL (ref 4.0–10.5)
nRBC: 0 % (ref 0.0–0.2)

## 2022-05-20 MED ORDER — HEPARIN SOD (PORK) LOCK FLUSH 100 UNIT/ML IV SOLN
500.0000 [IU] | Freq: Once | INTRAVENOUS | Status: AC
  Administered 2022-05-20: 500 [IU] via INTRAVENOUS
  Filled 2022-05-20: qty 5

## 2022-05-20 MED ORDER — CIPROFLOXACIN HCL 500 MG PO TABS
500.0000 mg | ORAL_TABLET | Freq: Two times a day (BID) | ORAL | 0 refills | Status: DC
Start: 2022-05-20 — End: 2022-07-08

## 2022-05-20 MED ORDER — FLUCONAZOLE 100 MG PO TABS: 100.0000 mg | ORAL_TABLET | Freq: Every day | ORAL | 0 refills | Status: DC
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
## 2022-05-20 NOTE — Progress Notes (Unsigned)
Pt in for follow up and treatment today.  Pt reports urinary symptoms, foul odor and burning on urination. Pt reports symptoms started on Tuesday this week.

## 2022-05-20 NOTE — Assessment & Plan Note (Addendum)
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 STABLE; tumor markers- improving. On avastin + lynparza-STABLE.  CT April 2023- NED.  # Proceed with avastin today; CBC- platelets >100; CMP are reviewed; adequate today.  Mild proteinuria monitor for now.  Continue Lynparza 200 mg twice a day [severe anemia while on 250 mg twice a day].   # Dysuria: clinically suspicosu of infection/UTI- clean catch/cultures.   # Abdominal hernia surgery- recurrent - s/p re-evaluation at [facility]; will need to weight loss/quit smoking [weight loss clinic-expensive]-   # Hypertension: Blood pressure 140/94  Monitor closely on Avastin.  Repeat systolic 098J.  Continue metoprolol/clonidine hot flashes. STABLE>   # PN G-2/back pain [awaiting steroid injection]- on Lyrica 50 mg TID-STABLE.   # Nausea- continue Zofran/ compazine- STABLE.   #IV access/Mediport-stable/port flush  # DISPOSITION:  # HOLD  avastin today # Urine analysis/culture today # in 3 weeks - MD;   Avastin; labs-  UA; cbc/cmp; ca-125; urin random protein:creatiine ratio- Dr.B

## 2022-05-21 ENCOUNTER — Encounter: Payer: Self-pay | Admitting: Internal Medicine

## 2022-05-21 LAB — CA 125: Cancer Antigen (CA) 125: 7.6 U/mL (ref 0.0–38.1)

## 2022-05-23 LAB — URINE CULTURE: Culture: >=100000 — AB

## 2022-05-24 ENCOUNTER — Encounter: Payer: Self-pay | Admitting: Internal Medicine

## 2022-05-24 MED ORDER — OXYCODONE HCL 5 MG PO TABS: 2.5000 mg | ORAL_TABLET | Freq: Four times a day (QID) | ORAL | 0 refills | Status: DC | PRN

## 2022-05-25 ENCOUNTER — Other Ambulatory Visit (HOSPITAL_COMMUNITY): Payer: Self-pay

## 2022-05-25 NOTE — Telephone Encounter (Signed)
I have called patient and left a message for her to call back to schedule appointment. Thank you

## 2022-05-26 ENCOUNTER — Inpatient Hospital Stay: Payer: Medicare Other | Attending: Internal Medicine | Admitting: Hospice and Palliative Medicine

## 2022-05-26 ENCOUNTER — Encounter: Payer: Self-pay | Admitting: Internal Medicine

## 2022-05-26 DIAGNOSIS — C482 Malignant neoplasm of peritoneum, unspecified: Secondary | ICD-10-CM | POA: Diagnosis not present

## 2022-05-26 DIAGNOSIS — G893 Neoplasm related pain (acute) (chronic): Secondary | ICD-10-CM

## 2022-05-26 DIAGNOSIS — Z5112 Encounter for antineoplastic immunotherapy: Secondary | ICD-10-CM | POA: Insufficient documentation

## 2022-05-26 DIAGNOSIS — Z515 Encounter for palliative care: Secondary | ICD-10-CM | POA: Diagnosis not present

## 2022-05-26 NOTE — Progress Notes (Signed)
Virtual Visit via Telephone Note  I connected with [name] on 05/26/22 at 10:20 AM EDT by telephone and verified that I am speaking with the correct person using two identifiers.  Location: Patient: Home Provider: Clinic   I discussed the limitations, risks, security and privacy concerns of performing an evaluation and management service by telephone and the availability of in person appointments. I also discussed with the patient that there may be a patient responsible charge related to this service. The patient expressed understanding and agreed to proceed.   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  Patient is also status post neoadjuvant carbo/Taxol on maintenance Avastin/Lynparza.  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  Patient has also had chronic abdominal pain.  She was referred to palliative care to help address goals and manage ongoing symptoms.   Observations/Objective: Routine follow-up.    Patient reports that she is doing reasonably well.  She has had worse back pain recently and sees a spine surgeon tomorrow for consideration of steroid injections.  Primarily, she reports pain in the hips and legs that was made worse while on chemotherapy.  She says she takes oxycodone generally once or twice a day and finds that significantly helpful for managing the pain.  However, at times she takes 2 of oxycodone at a time.  No issues or adverse effects reported from pain medications.  Patient denies other symptomatic complaints at present.  Assessment and Plan: Stage IV serous  adenocarcinoma -on systemic chemotherapy.  Stable tumor markers.  Followed by Dr. Rogue Bussing   Neoplasm related pain-continue oxycodone.  Recently refilled.  Insomnia -continue Lunesta  Follow Up Instructions: Follow-up telephone visit 1 month   I discussed the assessment and treatment plan with the patient. The patient was provided an opportunity to ask questions and all were answered. The patient agreed with the plan and demonstrated an understanding of the instructions.   The patient was advised to call back or seek an in-person evaluation if the symptoms worsen or if the condition fails to improve as anticipated.  I provided 10 minutes of non-face-to-face time during this encounter.   Irean Hong, NP

## 2022-05-27 ENCOUNTER — Other Ambulatory Visit (HOSPITAL_COMMUNITY): Payer: Self-pay

## 2022-06-02 ENCOUNTER — Other Ambulatory Visit (HOSPITAL_COMMUNITY): Payer: Self-pay

## 2022-06-03 ENCOUNTER — Encounter: Payer: Self-pay | Admitting: Internal Medicine

## 2022-06-09 ENCOUNTER — Ambulatory Visit: Payer: Medicaid Other

## 2022-06-09 ENCOUNTER — Other Ambulatory Visit: Payer: Medicaid Other

## 2022-06-09 ENCOUNTER — Ambulatory Visit: Payer: Medicaid Other | Admitting: Internal Medicine

## 2022-06-10 ENCOUNTER — Ambulatory Visit: Payer: Medicaid Other

## 2022-06-10 ENCOUNTER — Ambulatory Visit: Payer: Medicaid Other | Admitting: Internal Medicine

## 2022-06-10 ENCOUNTER — Other Ambulatory Visit: Payer: Medicaid Other

## 2022-06-10 ENCOUNTER — Ambulatory Visit: Payer: Medicare Other | Admitting: Family Medicine

## 2022-06-11 ENCOUNTER — Other Ambulatory Visit: Payer: Self-pay | Admitting: Family Medicine

## 2022-06-11 DIAGNOSIS — K219 Gastro-esophageal reflux disease without esophagitis: Secondary | ICD-10-CM

## 2022-06-14 ENCOUNTER — Encounter: Payer: Self-pay | Admitting: Internal Medicine

## 2022-06-15 ENCOUNTER — Telehealth: Payer: Self-pay

## 2022-06-15 NOTE — Telephone Encounter (Signed)
  Copied from [facility]. Topic: Referral - Question >> Jun 15, 2022  9:13 AM Leilani Able wrote: Reason for CRM: [facility],  re Colonoscopy - Natale Milch called back on this referral from yesterda DOB and Dr Chauncey Cruel signature missing, pls resend with dob and signature   I have not sent any forms but I will resend the referral with the last office note, demo, etc...   Release ID # [id number]

## 2022-06-17 ENCOUNTER — Ambulatory Visit (INDEPENDENT_AMBULATORY_CARE_PROVIDER_SITE_OTHER): Payer: Medicare Other | Admitting: Family Medicine

## 2022-06-17 ENCOUNTER — Inpatient Hospital Stay (HOSPITAL_BASED_OUTPATIENT_CLINIC_OR_DEPARTMENT_OTHER): Payer: Medicare Other | Admitting: Internal Medicine

## 2022-06-17 ENCOUNTER — Encounter: Payer: Self-pay | Admitting: Internal Medicine

## 2022-06-17 ENCOUNTER — Encounter: Payer: Self-pay | Admitting: Family Medicine

## 2022-06-17 ENCOUNTER — Inpatient Hospital Stay: Payer: Medicare Other

## 2022-06-17 VITALS — BP 106/70 | HR 76 | Temp 98.1°F | Resp 16 | Ht 63.0 in | Wt 247.7 lb

## 2022-06-17 DIAGNOSIS — F331 Major depressive disorder, recurrent, moderate: Secondary | ICD-10-CM | POA: Insufficient documentation

## 2022-06-17 DIAGNOSIS — C482 Malignant neoplasm of peritoneum, unspecified: Secondary | ICD-10-CM

## 2022-06-17 DIAGNOSIS — R7303 Prediabetes: Secondary | ICD-10-CM | POA: Diagnosis not present

## 2022-06-17 DIAGNOSIS — R062 Wheezing: Secondary | ICD-10-CM | POA: Diagnosis not present

## 2022-06-17 DIAGNOSIS — Z5112 Encounter for antineoplastic immunotherapy: Secondary | ICD-10-CM | POA: Diagnosis not present

## 2022-06-17 DIAGNOSIS — J410 Simple chronic bronchitis: Secondary | ICD-10-CM | POA: Diagnosis not present

## 2022-06-17 DIAGNOSIS — K219 Gastro-esophageal reflux disease without esophagitis: Secondary | ICD-10-CM

## 2022-06-17 DIAGNOSIS — E785 Hyperlipidemia, unspecified: Secondary | ICD-10-CM | POA: Diagnosis not present

## 2022-06-17 DIAGNOSIS — G43109 Migraine with aura, not intractable, without status migrainosus: Secondary | ICD-10-CM | POA: Diagnosis not present

## 2022-06-17 DIAGNOSIS — B001 Herpesviral vesicular dermatitis: Secondary | ICD-10-CM

## 2022-06-17 DIAGNOSIS — M5136 Other intervertebral disc degeneration, lumbar region: Secondary | ICD-10-CM | POA: Diagnosis not present

## 2022-06-17 DIAGNOSIS — Z7189 Other specified counseling: Secondary | ICD-10-CM

## 2022-06-17 DIAGNOSIS — R000 Tachycardia, unspecified: Secondary | ICD-10-CM | POA: Diagnosis not present

## 2022-06-17 HISTORY — DX: Major depressive disorder, recurrent, moderate: F33.1

## 2022-06-17 LAB — CBC WITH DIFFERENTIAL/PLATELET
Abs Immature Granulocytes: 0.02 10*3/uL (ref 0.00–0.07)
Basophils Absolute: 0.1 10*3/uL (ref 0.0–0.1)
Basophils Relative: 1 %
Eosinophils Absolute: 0.1 10*3/uL (ref 0.0–0.5)
Eosinophils Relative: 2 %
HCT: 42.4 % (ref 36.0–46.0)
Hemoglobin: 14.8 g/dL (ref 12.0–15.0)
Immature Granulocytes: 0 %
Lymphocytes Relative: 28 %
Lymphs Abs: 1.7 10*3/uL (ref 0.7–4.0)
MCH: 41.1 pg — ABNORMAL HIGH (ref 26.0–34.0)
MCHC: 34.9 g/dL (ref 30.0–36.0)
MCV: 117.8 fL — ABNORMAL HIGH (ref 80.0–100.0)
Monocytes Absolute: 0.4 10*3/uL (ref 0.1–1.0)
Monocytes Relative: 6 %
Neutro Abs: 3.8 10*3/uL (ref 1.7–7.7)
Neutrophils Relative %: 63 %
Platelets: 135 10*3/uL — ABNORMAL LOW (ref 150–400)
RBC: 3.6 MIL/uL — ABNORMAL LOW (ref 3.87–5.11)
RDW: 15.4 % (ref 11.5–15.5)
WBC: 6 10*3/uL (ref 4.0–10.5)
nRBC: 0 % (ref 0.0–0.2)

## 2022-06-17 LAB — COMPREHENSIVE METABOLIC PANEL
ALT: 18 U/L (ref 0–44)
AST: 28 U/L (ref 15–41)
Albumin: 3.9 g/dL (ref 3.5–5.0)
Alkaline Phosphatase: 61 U/L (ref 38–126)
Anion gap: 10 (ref 5–15)
BUN: 16 mg/dL (ref 6–20)
CO2: 24 mmol/L (ref 22–32)
Calcium: 8.8 mg/dL — ABNORMAL LOW (ref 8.9–10.3)
Chloride: 103 mmol/L (ref 98–111)
Creatinine, Ser: 0.88 mg/dL (ref 0.44–1.00)
GFR, Estimated: >60 mL/min (ref >60)
Glucose, Bld: 137 mg/dL — ABNORMAL HIGH (ref 70–99)
Potassium: 3.7 mmol/L (ref 3.5–5.1)
Sodium: 137 mmol/L (ref 135–145)
Total Bilirubin: 0.7 mg/dL (ref 0.3–1.2)
Total Protein: 7.1 g/dL (ref 6.5–8.1)

## 2022-06-17 LAB — URINALYSIS, COMPLETE (UACMP) WITH MICROSCOPIC
Bilirubin Urine: NEGATIVE
Glucose, UA: NEGATIVE mg/dL
Hgb urine dipstick: NEGATIVE
Ketones, ur: NEGATIVE mg/dL
Leukocytes,Ua: NEGATIVE
Nitrite: NEGATIVE
Protein, ur: NEGATIVE mg/dL
Specific Gravity, Urine: 1.025 (ref 1.005–1.030)
pH: 5 (ref 5.0–8.0)

## 2022-06-17 LAB — PROTEIN / CREATININE RATIO, URINE
Creatinine, Urine: 239 mg/dL
Protein Creatinine Ratio: 0.08 mg/mg{Cre} (ref 0.00–0.15)
Total Protein, Urine: 18 mg/dL

## 2022-06-17 MED ORDER — HEPARIN SOD (PORK) LOCK FLUSH 100 UNIT/ML IV SOLN
500.0000 [IU] | Freq: Once | INTRAVENOUS | Status: AC | PRN
  Administered 2022-06-17: 500 [IU]
  Filled 2022-06-17: qty 5

## 2022-06-17 MED ORDER — SODIUM CHLORIDE 0.9 % IV SOLN
15.0000 mg/kg | Freq: Once | INTRAVENOUS | Status: AC
  Administered 2022-06-17: 1700 mg via INTRAVENOUS
  Filled 2022-06-17: qty 64

## 2022-06-17 MED ORDER — PROCHLORPERAZINE MALEATE 10 MG PO TABS: 10.0000 mg | ORAL_TABLET | Freq: Four times a day (QID) | ORAL | 3 refills | Status: DC | PRN

## 2022-06-17 MED ORDER — SODIUM CHLORIDE 0.9 % IV SOLN
Freq: Once | INTRAVENOUS | Status: AC
  Filled 2022-06-17: qty 250

## 2022-06-17 MED ORDER — HEPARIN SOD (PORK) LOCK FLUSH 100 UNIT/ML IV SOLN
INTRAVENOUS | Status: AC
  Filled 2022-06-17: qty 5

## 2022-06-17 MED ORDER — SUMATRIPTAN SUCCINATE 100 MG PO TABS: 100.0000 mg | ORAL_TABLET | ORAL | 2 refills | Status: DC | PRN

## 2022-06-17 MED ORDER — BUDESONIDE-FORMOTEROL FUMARATE 160-4.5 MCG/ACT IN AERO: 2.0000 | INHALATION_SPRAY | Freq: Two times a day (BID) | RESPIRATORY_TRACT | 3 refills | Status: DC

## 2022-06-17 MED ORDER — OMEPRAZOLE 20 MG PO CPDR: DELAYED_RELEASE_CAPSULE | ORAL | 2 refills | Status: DC

## 2022-06-17 MED ORDER — BUPROPION HCL ER (XL) 150 MG PO TB24: 150.0000 mg | ORAL_TABLET | Freq: Every day | ORAL | 0 refills | Status: DC

## 2022-06-17 MED ORDER — VALACYCLOVIR HCL 1 G PO TABS: 1000.0000 mg | ORAL_TABLET | Freq: Two times a day (BID) | ORAL | 2 refills | Status: DC | PRN

## 2022-06-17 MED ORDER — DULOXETINE HCL 60 MG PO CPEP: 60.0000 mg | ORAL_CAPSULE | Freq: Every day | ORAL | 1 refills | Status: DC

## 2022-06-17 NOTE — Assessment & Plan Note (Addendum)
Managed on cymbalta high dose, mood worse, feeling hopeless and tired She asks about adjusting meds Consulted with Dr. Carlynn Purl - stated could add abilify if cymbalta dose is 60 - however she is on high dose Will refer to therapy (check with oncology first for resources)  Further med adjustments through PCP     05/17/2022   11:24 AM 04/13/2022    3:19 PM 08/31/2021    1:43 PM  Depression screen PHQ 2/9  Decreased Interest 2 1 1   Down, Depressed, Hopeless 2 1 1   PHQ - 2 Score 4 2 2   Altered sleeping 3 1 0  Tired, decreased energy 3 1 3   Change in appetite 1 0 2  Feeling bad or failure about yourself  2 1 1   Trouble concentrating 2 1 0  Moving slowly or fidgety/restless 0 0 0  Suicidal thoughts 0 0 0  PHQ-9 Score 15 6 8   Difficult doing work/chores  Somewhat difficult

## 2022-06-17 NOTE — Assessment & Plan Note (Signed)
Weight stable over the past couple months while on tx

## 2022-06-17 NOTE — Assessment & Plan Note (Signed)
imitrex abortive, last refill about 9 months ago

## 2022-06-17 NOTE — Assessment & Plan Note (Signed)
prilosec prn, wean off if able, can use pepcid BID prn

## 2022-06-18 LAB — CA 125: Cancer Antigen (CA) 125: 10.4 U/mL (ref 0.0–38.1)

## 2022-06-19 ENCOUNTER — Other Ambulatory Visit: Payer: Self-pay | Admitting: Family Medicine

## 2022-06-19 DIAGNOSIS — F331 Major depressive disorder, recurrent, moderate: Secondary | ICD-10-CM

## 2022-06-19 DIAGNOSIS — G620 Drug-induced polyneuropathy: Secondary | ICD-10-CM

## 2022-06-19 DIAGNOSIS — J410 Simple chronic bronchitis: Secondary | ICD-10-CM

## 2022-06-20 ENCOUNTER — Telehealth: Payer: Self-pay | Admitting: *Deleted

## 2022-06-20 NOTE — Chronic Care Management (AMB) (Signed)
  Chronic Care Management   Note  06/20/2022 Name: [name] MRN: [id number] DOB: 1971-10-10  [name] is a 51 y.o. year old female who is a primary care patient of [name], MD. I reached out to [name] by phone today in response to a referral sent by [name] PCP.  [name] was given information about Chronic Care Management services today including:  CCM service includes personalized support from designated clinical staff supervised by her physician, including individualized plan of care and coordination with other care providers 24/7 contact phone numbers for assistance for urgent and routine care needs. Service will only be billed when office clinical staff spend 20 minutes or more in a month to coordinate care. Only one practitioner may furnish and bill the service in a calendar month. The patient may stop CCM services at any time (effective at the end of the month) by phone call to the office staff. The patient is responsible for co-pay (up to 20% after annual deductible is met) if co-pay is required by the individual health plan.   Patient agreed to services and verbal consent obtained.   Follow up plan: Telephone appointment with care management team member scheduled for: 06/27/2022  Burman Nieves, CCMA Care Guide, Embedded Care Coordination [facility]  Care Management  Direct Dial: [phone]

## 2022-06-21 ENCOUNTER — Telehealth: Payer: Self-pay

## 2022-06-21 NOTE — Telephone Encounter (Signed)
   Telephone encounter was:  Unsuccessful.  06/21/2022 Name: [name] MRN: [id number] DOB: [birth year]  Unsuccessful outbound call made today to assist with:   housing.  Outreach Attempt:  1st Attempt  A HIPAA compliant voice message was left requesting a return call.  Instructed patient to call back at [phone].  Aysia Lowder, AAS Paralegal, [facility] Care Guide  Embedded Care Coordination [place]  [facility]  [address] [place], [place] [address] ??[email or web address]  ?? [phone]   [email or web address]

## 2022-06-22 ENCOUNTER — Telehealth: Payer: Self-pay

## 2022-06-22 NOTE — Telephone Encounter (Signed)
   Telephone encounter was:  Unsuccessful.  06/22/2022 Name: [name] MRN: [id number] DOB: Jan 28, 1971  Unsuccessful outbound call made today to assist with:   housing.  Outreach Attempt:  2nd Attempt  A HIPAA compliant voice message was left requesting a return call.  Instructed patient to call back at [phone]. Returned patient's call.  Left message on voicemail for patient to return my call regarding housing resources.     Jovane Foutz, AAS Paralegal, [place] Management  [address], [place] [address] ??[email or web address]  ?? [phone]   [email or web address]

## 2022-06-22 NOTE — Telephone Encounter (Signed)
   Telephone encounter was:  Successful.  06/22/2022 Name: [name] MRN: [id number] DOB: 04-08-1971  [name] is a 51 y.o. year old female who is a primary care patient of Steele Sizer, MD . The community resource team was consulted for assistance with  housing.  Care guide performed the following interventions: Spoke with patient about housing resources.  Patient stated that she needs housing resources for [place], [place] and [place].  I verfied the email address [email or web address], sent information for [facility].  I will research and send information for [place] and [place] counties.  Follow Up Plan:  Care guide will follow up with patient by phone over the next 7 days.  Shakeeta Godette, AAS Paralegal, [place] Management  [address], [place] [address] ??[email or web address]  ?? [phone]   [email or web address]

## 2022-06-23 ENCOUNTER — Encounter: Payer: Self-pay | Admitting: Internal Medicine

## 2022-06-23 ENCOUNTER — Other Ambulatory Visit (HOSPITAL_COMMUNITY): Payer: Self-pay

## 2022-06-27 ENCOUNTER — Telehealth: Payer: Self-pay | Admitting: *Deleted

## 2022-06-27 ENCOUNTER — Telehealth: Payer: Self-pay

## 2022-06-27 ENCOUNTER — Telehealth: Payer: Medicare Other

## 2022-06-27 ENCOUNTER — Telehealth: Payer: Self-pay | Admitting: Family Medicine

## 2022-06-27 NOTE — Telephone Encounter (Signed)
Copied from [place] [phone]. Topic: Referral - Status >> Jun 27, 2022  9:05 AM Leilani Able wrote: Reason for CRM: On behalf of [place], please call [phone] ref referral [phone] States that referral incomplete, no DOB so needs to resubmit everything, No dr signature/ caller not the one to receive just stating incomplete restart or call for more info

## 2022-06-27 NOTE — Telephone Encounter (Signed)
  Care Management   Follow Up Note   06/27/2022 Name: [name] MRN: [id number] DOB: March 21, 1971   Referred by: Steele Sizer, MD Reason for referral : Care Coordination   An unsuccessful telephone outreach was attempted today. The patient was referred to the case management team for assistance with care management and care coordination.   Follow Up Plan: Telephone follow up appointment with care management team member to be re-scheduled by careguide.   .cls

## 2022-06-27 NOTE — Telephone Encounter (Signed)
   Telephone encounter was:  Unsuccessful.  06/27/2022 Name: [name] MRN: [id number] DOB: May 18, 1971  Unsuccessful outbound call made today to assist with:   housing.  Outreach Attempt:  2nd Attempt  A HIPAA compliant voice message was left requesting a return call.  Instructed patient to call back at [phone].  Left message on voicemail to follow-up on email sent with housing resources on 06/22/22.     Elyn Krogh, AAS Paralegal, [facility]  [address], [place] [address] ??[email or web address]  ?? [phone]   [email or web address]

## 2022-06-29 ENCOUNTER — Encounter: Payer: Self-pay | Admitting: Internal Medicine

## 2022-06-29 ENCOUNTER — Other Ambulatory Visit (HOSPITAL_COMMUNITY): Payer: Self-pay

## 2022-06-29 ENCOUNTER — Other Ambulatory Visit: Payer: Self-pay | Admitting: Hospice and Palliative Medicine

## 2022-06-29 ENCOUNTER — Telehealth: Payer: Self-pay | Admitting: Pharmacy Technician

## 2022-06-29 NOTE — Telephone Encounter (Signed)
Oral Oncology Patient Advocate Encounter   Received notification from [employer] that prior authorization for [name] is required.   PA submitted on CoverMyMeds Key [id number]  Status is pending   Oral Oncology Clinic will continue to follow.  [place] Patient [place] Phone [phone] Fax [phone] 06/29/2022 4:15 PM

## 2022-06-29 NOTE — Telephone Encounter (Signed)
Oral Oncology Patient Advocate Encounter  Prior Authorization for [name] has been approved.    PA# [id number] Effective dates: 05/30/22 through 06/28/25  Oral Oncology Clinic will continue to follow.   [place] Patient [place] Phone [phone] Fax [phone] 06/29/2022 4:17 PM

## 2022-06-30 ENCOUNTER — Other Ambulatory Visit (HOSPITAL_COMMUNITY): Payer: Self-pay

## 2022-06-30 ENCOUNTER — Encounter: Payer: Self-pay | Admitting: Internal Medicine

## 2022-06-30 ENCOUNTER — Inpatient Hospital Stay: Payer: Medicare Other | Attending: Internal Medicine | Admitting: Hospice and Palliative Medicine

## 2022-06-30 DIAGNOSIS — Z5112 Encounter for antineoplastic immunotherapy: Secondary | ICD-10-CM | POA: Insufficient documentation

## 2022-06-30 DIAGNOSIS — C482 Malignant neoplasm of peritoneum, unspecified: Secondary | ICD-10-CM | POA: Insufficient documentation

## 2022-06-30 DIAGNOSIS — Z79899 Other long term (current) drug therapy: Secondary | ICD-10-CM | POA: Insufficient documentation

## 2022-06-30 MED ORDER — ESZOPICLONE 2 MG PO TABS: 2.0000 mg | ORAL_TABLET | Freq: Every evening | ORAL | 2 refills | Status: DC | PRN

## 2022-06-30 MED ORDER — OXYCODONE HCL 5 MG PO TABS: 2.5000 mg | ORAL_TABLET | Freq: Four times a day (QID) | ORAL | 0 refills | Status: DC | PRN

## 2022-06-30 NOTE — Progress Notes (Signed)
1127 am- attempted to reach patient via telephone call to reconcile meds prior to provider encounter.

## 2022-06-30 NOTE — Progress Notes (Signed)
Virtual Visit via Telephone Note  I connected with LOY LITTLE on 06/30/22 at  1:50 PM EDT by telephone and verified that I am speaking with the correct person using two identifiers.  Location: Patient: Home Provider: Clinic   I discussed the limitations, risks, security and privacy concerns of performing an evaluation and management service by telephone and the availability of in person appointments. I also discussed with the patient that there may be a patient responsible charge related to this service. The patient expressed understanding and agreed to proceed.   History of Present Illness: Ms. 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  Patient is also status post neoadjuvant carbo/Taxol on maintenance Avastin/Lynparza.  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  Patient has also had chronic abdominal pain.  She was referred to palliative care to help address goals and manage ongoing symptoms.   Observations/Objective: Routine follow-up.    Patient reports that she is doing well.  She denies any significant changes or concerns.  She reports pain is stable on as needed oxycodone.  Patient did request refill of oxycodone today.  PDMP reviewed.  Refill is appropriate and sent to pharmacy.  Patient denies any adverse effects or issues with pain meds.  Patient also request refill of Lunesta.  She is taking it as needed and finds it effective at initiating and maintaining sleep.  Assessment and Plan: Stage IV serous adenocarcinoma -on systemic chemotherapy.  Followed by Dr. Rogue Bussing who sees patient   Neoplasm related  pain-Refill oxycodone.   Insomnia -Refill Lunesta  Follow Up Instructions: Follow-up telephone visit 1-2 months   I discussed the assessment and treatment plan with the patient. The patient was provided an opportunity to ask questions and all were answered. The patient agreed with the plan and demonstrated an understanding of the instructions.   The patient was advised to call back or seek an in-person evaluation if the symptoms worsen or if the condition fails to improve as anticipated.  I provided 10 minutes of non-face-to-face time during this encounter.   Irean Hong, NP

## 2022-07-01 ENCOUNTER — Encounter: Payer: Self-pay | Admitting: *Deleted

## 2022-07-01 ENCOUNTER — Telehealth: Payer: Self-pay | Admitting: *Deleted

## 2022-07-01 ENCOUNTER — Other Ambulatory Visit (HOSPITAL_COMMUNITY): Payer: Self-pay

## 2022-07-01 ENCOUNTER — Other Ambulatory Visit: Payer: Self-pay | Admitting: Pharmacist

## 2022-07-01 DIAGNOSIS — C482 Malignant neoplasm of peritoneum, unspecified: Secondary | ICD-10-CM

## 2022-07-01 MED ORDER — OLAPARIB 100 MG PO TABS: 200.0000 mg | ORAL_TABLET | Freq: Two times a day (BID) | ORAL | 3 refills | Status: DC

## 2022-07-01 NOTE — Progress Notes (Signed)
Patient now required to fill at [facility]. Redirecting prescription from [place]

## 2022-07-01 NOTE — Telephone Encounter (Signed)
Received notification from [employer] that pt's Lunesta requires [employer] exception. I called the insurance. PA approved. Note: Pt has been on Lunesta since August 2022.

## 2022-07-04 ENCOUNTER — Other Ambulatory Visit (HOSPITAL_COMMUNITY): Payer: Self-pay

## 2022-07-05 ENCOUNTER — Telehealth: Payer: Self-pay

## 2022-07-05 NOTE — Telephone Encounter (Signed)
   Telephone encounter was:  Unsuccessful.  07/05/2022 Name: [name] MRN: [id number] DOB: 05/28/1971  Unsuccessful outbound call made today to assist with:   housing.  Outreach Attempt:  3rd Attempt.  Referral closed unable to contact patient.  A HIPAA compliant voice message was left requesting a return call.  Instructed patient to call back at [phone].  Auther Lyerly, AAS Paralegal, [facility]  [address], [place] [address] ??[email or web address]  ?? [phone]   [email or web address]

## 2022-07-08 ENCOUNTER — Ambulatory Visit: Payer: Medicare Other

## 2022-07-08 ENCOUNTER — Encounter: Payer: Self-pay | Admitting: Internal Medicine

## 2022-07-08 ENCOUNTER — Inpatient Hospital Stay: Payer: Medicare Other

## 2022-07-08 ENCOUNTER — Inpatient Hospital Stay (HOSPITAL_BASED_OUTPATIENT_CLINIC_OR_DEPARTMENT_OTHER): Payer: Medicare Other | Admitting: Internal Medicine

## 2022-07-08 ENCOUNTER — Ambulatory Visit: Payer: Medicare Other | Admitting: Internal Medicine

## 2022-07-08 ENCOUNTER — Other Ambulatory Visit: Payer: Medicare Other

## 2022-07-08 DIAGNOSIS — C482 Malignant neoplasm of peritoneum, unspecified: Secondary | ICD-10-CM

## 2022-07-08 DIAGNOSIS — Z7189 Other specified counseling: Secondary | ICD-10-CM

## 2022-07-08 DIAGNOSIS — Z5112 Encounter for antineoplastic immunotherapy: Secondary | ICD-10-CM | POA: Diagnosis not present

## 2022-07-08 DIAGNOSIS — Z79899 Other long term (current) drug therapy: Secondary | ICD-10-CM | POA: Diagnosis not present

## 2022-07-08 LAB — URINALYSIS, COMPLETE (UACMP) WITH MICROSCOPIC
Bilirubin Urine: NEGATIVE
Glucose, UA: NEGATIVE mg/dL
Hgb urine dipstick: NEGATIVE
Ketones, ur: NEGATIVE mg/dL
Leukocytes,Ua: NEGATIVE
Nitrite: NEGATIVE
Protein, ur: NEGATIVE mg/dL
Specific Gravity, Urine: 1.019 (ref 1.005–1.030)
pH: 6 (ref 5.0–8.0)

## 2022-07-08 LAB — CBC WITH DIFFERENTIAL/PLATELET
Abs Immature Granulocytes: 0.01 10*3/uL (ref 0.00–0.07)
Basophils Absolute: 0 10*3/uL (ref 0.0–0.1)
Basophils Relative: 1 %
Eosinophils Absolute: 0.1 10*3/uL (ref 0.0–0.5)
Eosinophils Relative: 2 %
HCT: 43.1 % (ref 36.0–46.0)
Hemoglobin: 15.1 g/dL — ABNORMAL HIGH (ref 12.0–15.0)
Immature Granulocytes: 0 %
Lymphocytes Relative: 29 %
Lymphs Abs: 1.4 10*3/uL (ref 0.7–4.0)
MCH: 40.7 pg — ABNORMAL HIGH (ref 26.0–34.0)
MCHC: 35 g/dL (ref 30.0–36.0)
MCV: 116.2 fL — ABNORMAL HIGH (ref 80.0–100.0)
Monocytes Absolute: 0.3 10*3/uL (ref 0.1–1.0)
Monocytes Relative: 6 %
Neutro Abs: 3 10*3/uL (ref 1.7–7.7)
Neutrophils Relative %: 62 %
Platelets: 117 10*3/uL — ABNORMAL LOW (ref 150–400)
RBC: 3.71 MIL/uL — ABNORMAL LOW (ref 3.87–5.11)
RDW: 14.6 % (ref 11.5–15.5)
WBC: 4.8 10*3/uL (ref 4.0–10.5)
nRBC: 0 % (ref 0.0–0.2)

## 2022-07-08 LAB — COMPREHENSIVE METABOLIC PANEL
ALT: 17 U/L (ref 0–44)
AST: 23 U/L (ref 15–41)
Albumin: 3.9 g/dL (ref 3.5–5.0)
Alkaline Phosphatase: 56 U/L (ref 38–126)
Anion gap: 6 (ref 5–15)
BUN: 9 mg/dL (ref 6–20)
CO2: 25 mmol/L (ref 22–32)
Calcium: 8.8 mg/dL — ABNORMAL LOW (ref 8.9–10.3)
Chloride: 105 mmol/L (ref 98–111)
Creatinine, Ser: 0.81 mg/dL (ref 0.44–1.00)
GFR, Estimated: >60 mL/min (ref >60)
Glucose, Bld: 105 mg/dL — ABNORMAL HIGH (ref 70–99)
Potassium: 4 mmol/L (ref 3.5–5.1)
Sodium: 136 mmol/L (ref 135–145)
Total Bilirubin: 0.8 mg/dL (ref 0.3–1.2)
Total Protein: 7 g/dL (ref 6.5–8.1)

## 2022-07-08 LAB — PROTEIN / CREATININE RATIO, URINE
Creatinine, Urine: 208 mg/dL
Protein Creatinine Ratio: 0.08 mg/mg{Cre} (ref 0.00–0.15)
Total Protein, Urine: 17 mg/dL

## 2022-07-08 MED ORDER — HEPARIN SOD (PORK) LOCK FLUSH 100 UNIT/ML IV SOLN
500.0000 [IU] | Freq: Once | INTRAVENOUS | Status: AC | PRN
  Administered 2022-07-08: 500 [IU]
  Filled 2022-07-08: qty 5

## 2022-07-08 MED ORDER — SODIUM CHLORIDE 0.9 % IV SOLN
Freq: Once | INTRAVENOUS | Status: AC
  Filled 2022-07-08: qty 250

## 2022-07-08 MED ORDER — SODIUM CHLORIDE 0.9 % IV SOLN
15.0000 mg/kg | Freq: Once | INTRAVENOUS | Status: AC
  Administered 2022-07-08: 1700 mg via INTRAVENOUS
  Filled 2022-07-08: qty 64

## 2022-07-08 NOTE — Assessment & Plan Note (Addendum)
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 STABLE; tumor markers- improving. On avastin + lynparza-STABLE.  CT AP April 2023- NED.  #  PROCEED WITH Avastin treatment today [see discussion below].  Labs reviewed.;  CBC- platelets >100; CMP are reviewed; adequate today.JUNE  2023-NEG.  Continue Lynparza 200 mg twice a day [severe anemia while on 250 mg twice a day].   # Hypertension: Blood pressure 140/94  Monitor closely on Avastin.   Continue metoprolol/clonidine hot flashes. STABLE>   # PN G-2/back pain [awaiting steroid injection]- on Lyrica 50 mg TID-STABLE.   # Nausea- continue Zofran/ compazine prn  STABLE.   #IV access/Mediport-stable/port flush  # DISPOSITION: schedule 10-11 am appt #  avastin today- ok with out urine protein # in 3 weeks - MD;   Avastin; labs-  UA; cbc/cmp; ca-125; urin random protein:creatiine ratio- Dr.B
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
## 2022-07-08 NOTE — Patient Instructions (Signed)
[facility] CTR AT [place]  Discharge Instructions: Thank you for choosing [facility] to provide your oncology and hematology care.  If you have a lab appointment with the [facility], please go directly to the [facility] and check in at the registration area.  Wear comfortable clothing and clothing appropriate for easy access to any Portacath or PICC line.   We strive to give you quality time with your provider. You may need to reschedule your appointment if you arrive late (15 or more minutes).  Arriving late affects you and other patients whose appointments are after yours.  Also, if you miss three or more appointments without notifying the office, you may be dismissed from the clinic at the provider's discretion.      For prescription refill requests, have your pharmacy contact our office and allow 72 hours for refills to be completed.    Today you received the following chemotherapy and/or immunotherapy agents MVASI      To help prevent nausea and vomiting after your treatment, we encourage you to take your nausea medication as directed.  BELOW ARE SYMPTOMS THAT SHOULD BE REPORTED IMMEDIATELY: *FEVER GREATER THAN 100.4 F (38 C) OR HIGHER *CHILLS OR SWEATING *NAUSEA AND VOMITING THAT IS NOT CONTROLLED WITH YOUR NAUSEA MEDICATION *UNUSUAL SHORTNESS OF BREATH *UNUSUAL BRUISING OR BLEEDING *URINARY PROBLEMS (pain or burning when urinating, or frequent urination) *BOWEL PROBLEMS (unusual diarrhea, constipation, pain near the anus) TENDERNESS IN MOUTH AND THROAT WITH OR WITHOUT PRESENCE OF ULCERS (sore throat, sores in mouth, or a toothache) UNUSUAL RASH, SWELLING OR PAIN  UNUSUAL VAGINAL DISCHARGE OR ITCHING   Items with * indicate a potential emergency and should be followed up as soon as possible or go to the Emergency Department if any problems should occur.  Please show the CHEMOTHERAPY ALERT CARD or IMMUNOTHERAPY ALERT CARD at check-in to the  Emergency Department and triage nurse.  Should you have questions after your visit or need to cancel or reschedule your appointment, please contact [facility] [place] AT [place]  [phone] and follow the prompts.  Office hours are 8:00 a.m. to 4:30 p.m. Monday - Friday. Please note that voicemails left after 4:00 p.m. may not be returned until the following business day.  We are closed weekends and major holidays. You have access to a nurse at all times for urgent questions. Please call the main number to the clinic [phone] and follow the prompts.  For any non-urgent questions, you may also contact your provider using MyChart. We now offer e-Visits for anyone 65 and older to request care online for non-urgent symptoms. For details visit [email or web address].   Also download the MyChart app! Go to the app store, search "MyChart", open the app, select [facility], and log in with your MyChart username and password.  Masks are optional in the cancer centers. If you would like for your care team to wear a mask while they are taking care of you, please let them know. For doctor visits, patients may have with them one support person who is at least 50 years old. At this time, visitors are not allowed in the infusion area.  Bevacizumab injection What is this medication? BEVACIZUMAB (be va SIZ yoo mab) is a monoclonal antibody. It is used to treat many types of cancer. This medicine may be used for other purposes; ask your health care provider or pharmacist if you have questions. COMMON BRAND NAME(S): Alymsys, Avastin, MVASI, Noah Charon What should I tell my  care team before I take this medication? They need to know if you have any of these conditions: diabetes heart disease high blood pressure history of coughing up blood prior anthracycline chemotherapy (e.g., doxorubicin, daunorubicin, epirubicin) recent or ongoing radiation therapy recent or planning to have  surgery stroke an unusual or allergic reaction to bevacizumab, hamster proteins, mouse proteins, other medicines, foods, dyes, or preservatives pregnant or trying to get pregnant breast-feeding How should I use this medication? This medicine is for infusion into a vein. It is given by a health care professional in a hospital or clinic setting. Talk to your pediatrician regarding the use of this medicine in children. Special care may be needed. Overdosage: If you think you have taken too much of this medicine contact a poison control center or emergency room at once. NOTE: This medicine is only for you. Do not share this medicine with others. What if I miss a dose? It is important not to miss your dose. Call your doctor or health care professional if you are unable to keep an appointment. What may interact with this medication? Interactions are not expected. This list may not describe all possible interactions. Give your health care provider a list of all the medicines, herbs, non-prescription drugs, or dietary supplements you use. Also tell them if you smoke, drink alcohol, or use illegal drugs. Some items may interact with your medicine. What should I watch for while using this medication? Your condition will be monitored carefully while you are receiving this medicine. You will need important blood work and urine testing done while you are taking this medicine. This medicine may increase your risk to bruise or bleed. Call your doctor or health care professional if you notice any unusual bleeding. Before having surgery, talk to your health care provider to make sure it is ok. This drug can increase the risk of poor healing of your surgical site or wound. You will need to stop this drug for 28 days before surgery. After surgery, wait at least 28 days before restarting this drug. Make sure the surgical site or wound is healed enough before restarting this drug. Talk to your health care provider if  questions. Do not become pregnant while taking this medicine or for 6 months after stopping it. Women should inform their doctor if they wish to become pregnant or think they might be pregnant. There is a potential for serious side effects to an unborn child. Talk to your health care professional or pharmacist for more information. Do not breast-feed an infant while taking this medicine and for 6 months after the last dose. This medicine has caused ovarian failure in some women. This medicine may interfere with the ability to have a child. You should talk to your doctor or health care professional if you are concerned about your fertility. What side effects may I notice from receiving this medication? Side effects that you should report to your doctor or health care professional as soon as possible: allergic reactions like skin rash, itching or hives, swelling of the face, lips, or tongue chest pain or chest tightness chills coughing up blood high fever seizures severe constipation signs and symptoms of bleeding such as bloody or black, tarry stools; red or dark-brown urine; spitting up blood or brown material that looks like coffee grounds; red spots on the skin; unusual bruising or bleeding from the eye, gums, or nose signs and symptoms of a blood clot such as breathing problems; chest pain; severe, sudden headache; pain, swelling,  warmth in the leg signs and symptoms of a stroke like changes in vision; confusion; trouble speaking or understanding; severe headaches; sudden numbness or weakness of the face, arm or leg; trouble walking; dizziness; loss of balance or coordination stomach pain sweating swelling of legs or ankles vomiting weight gain Side effects that usually do not require medical attention (report to your doctor or health care professional if they continue or are bothersome): back pain changes in taste decreased appetite dry skin nausea tiredness This list may not describe  all possible side effects. Call your doctor for medical advice about side effects. You may report side effects to FDA at 1-800-FDA-1088. Where should I keep my medication? This drug is given in a hospital or clinic and will not be stored at home. NOTE: This sheet is a summary. It may not cover all possible information. If you have questions about this medicine, talk to your doctor, pharmacist, or health care provider.  2023 Elsevier/Gold Standard (2021-11-12 00:00:00)

## 2022-07-08 NOTE — Progress Notes (Signed)
Pt here for follow up. No new concerns voiced.   

## 2022-07-09 LAB — CA 125: Cancer Antigen (CA) 125: 9.8 U/mL (ref 0.0–38.1)

## 2022-07-11 ENCOUNTER — Telehealth: Payer: Self-pay | Admitting: *Deleted

## 2022-07-11 NOTE — Chronic Care Management (AMB) (Signed)
  Chronic Care Management Note  07/11/2022 Name: [name] MRN: [id number] DOB: 1971/08/11  [name] is a 51 y.o. year old female who is a primary care patient of Steele Sizer, MD and is actively engaged with the care management team. I reached out to [name] by phone today to assist with re-scheduling an initial visit with the Licensed Clinical Social Worker  Follow up plan: We have been unable to make contact with the patient for follow up. The care management team is available to follow up with the patient after provider conversation with the patient regarding recommendation for care management engagement and subsequent re-referral to the care management team.   Julian Hy, [place] Direct Dial: [phone]
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
## 2022-07-14 ENCOUNTER — Institutional Professional Consult (permissible substitution): Payer: Medicare Other | Admitting: Student

## 2022-07-21 DIAGNOSIS — H66002 Acute suppurative otitis media without spontaneous rupture of ear drum, left ear: Secondary | ICD-10-CM | POA: Diagnosis not present

## 2022-07-26 ENCOUNTER — Encounter: Payer: Self-pay | Admitting: Internal Medicine

## 2022-07-28 ENCOUNTER — Other Ambulatory Visit: Payer: Self-pay

## 2022-07-28 DIAGNOSIS — C482 Malignant neoplasm of peritoneum, unspecified: Secondary | ICD-10-CM

## 2022-07-29 ENCOUNTER — Inpatient Hospital Stay: Payer: Medicare Other | Attending: Internal Medicine

## 2022-07-29 ENCOUNTER — Inpatient Hospital Stay: Payer: Medicare Other | Admitting: Internal Medicine

## 2022-07-29 ENCOUNTER — Telehealth: Payer: Self-pay | Admitting: Internal Medicine

## 2022-07-29 ENCOUNTER — Inpatient Hospital Stay: Payer: Medicare Other

## 2022-07-29 DIAGNOSIS — C482 Malignant neoplasm of peritoneum, unspecified: Secondary | ICD-10-CM | POA: Insufficient documentation

## 2022-07-29 DIAGNOSIS — F17210 Nicotine dependence, cigarettes, uncomplicated: Secondary | ICD-10-CM | POA: Insufficient documentation

## 2022-07-29 DIAGNOSIS — Z79899 Other long term (current) drug therapy: Secondary | ICD-10-CM | POA: Insufficient documentation

## 2022-07-29 DIAGNOSIS — Z5112 Encounter for antineoplastic immunotherapy: Secondary | ICD-10-CM | POA: Insufficient documentation

## 2022-07-29 NOTE — Telephone Encounter (Signed)
Called and left voicemail for patient to call back to reschedule missed appointment.

## 2022-08-03 ENCOUNTER — Encounter: Payer: Self-pay | Admitting: Internal Medicine

## 2022-08-03 ENCOUNTER — Telehealth: Payer: Self-pay | Admitting: Internal Medicine

## 2022-08-03 DIAGNOSIS — H9122 Sudden idiopathic hearing loss, left ear: Secondary | ICD-10-CM | POA: Diagnosis not present

## 2022-08-03 DIAGNOSIS — H6692 Otitis media, unspecified, left ear: Secondary | ICD-10-CM | POA: Diagnosis not present

## 2022-08-03 DIAGNOSIS — H73892 Other specified disorders of tympanic membrane, left ear: Secondary | ICD-10-CM | POA: Diagnosis not present

## 2022-08-03 NOTE — Telephone Encounter (Signed)
Pt called and stated her ENT prescribed her a steroid for a severe ear infection and they need it cleared by you before she can receive it. She would like a call back to discuss.

## 2022-08-03 NOTE — Telephone Encounter (Signed)
Pt.notified

## 2022-08-05 ENCOUNTER — Ambulatory Visit (INDEPENDENT_AMBULATORY_CARE_PROVIDER_SITE_OTHER): Payer: Medicare Other | Admitting: Family Medicine

## 2022-08-05 ENCOUNTER — Encounter: Payer: Self-pay | Admitting: Family Medicine

## 2022-08-05 ENCOUNTER — Telehealth: Payer: Self-pay | Admitting: Family Medicine

## 2022-08-05 VITALS — BP 108/72 | HR 89 | Temp 98.0°F | Resp 16 | Ht 63.0 in | Wt 242.6 lb

## 2022-08-05 DIAGNOSIS — Z7689 Persons encountering health services in other specified circumstances: Secondary | ICD-10-CM

## 2022-08-05 DIAGNOSIS — E785 Hyperlipidemia, unspecified: Secondary | ICD-10-CM

## 2022-08-05 DIAGNOSIS — F331 Major depressive disorder, recurrent, moderate: Secondary | ICD-10-CM

## 2022-08-05 DIAGNOSIS — R7303 Prediabetes: Secondary | ICD-10-CM | POA: Diagnosis not present

## 2022-08-05 DIAGNOSIS — E8881 Metabolic syndrome: Secondary | ICD-10-CM | POA: Diagnosis not present

## 2022-08-05 MED ORDER — SEMAGLUTIDE-WEIGHT MANAGEMENT 1 MG/0.5ML ~~LOC~~ SOAJ: 1.0000 mg | SUBCUTANEOUS | 0 refills | Status: DC

## 2022-08-05 MED ORDER — SEMAGLUTIDE-WEIGHT MANAGEMENT 0.5 MG/0.5ML ~~LOC~~ SOAJ: 0.5000 mg | SUBCUTANEOUS | 0 refills | Status: DC

## 2022-08-05 MED ORDER — BUPROPION HCL ER (XL) 300 MG PO TB24: 300.0000 mg | ORAL_TABLET | Freq: Every day | ORAL | 0 refills | Status: DC

## 2022-08-05 MED ORDER — SEMAGLUTIDE(0.25 OR 0.5MG/DOS) 2 MG/1.5ML ~~LOC~~ SOPN: 0.2500 mg | PEN_INJECTOR | SUBCUTANEOUS | 0 refills | Status: DC

## 2022-08-05 MED ORDER — SEMAGLUTIDE(0.25 OR 0.5MG/DOS) 2 MG/3ML ~~LOC~~ SOPN: 0.2500 mg | PEN_INJECTOR | SUBCUTANEOUS | 0 refills | Status: DC

## 2022-08-05 MED ORDER — SEMAGLUTIDE-WEIGHT MANAGEMENT 2.4 MG/0.75ML ~~LOC~~ SOAJ: 2.4000 mg | SUBCUTANEOUS | 0 refills | Status: DC

## 2022-08-05 MED ORDER — SEMAGLUTIDE-WEIGHT MANAGEMENT 0.25 MG/0.5ML ~~LOC~~ SOAJ: 0.2500 mg | SUBCUTANEOUS | 0 refills | Status: AC

## 2022-08-05 MED ORDER — SEMAGLUTIDE-WEIGHT MANAGEMENT 1.7 MG/0.75ML ~~LOC~~ SOAJ: 1.7000 mg | SUBCUTANEOUS | 0 refills | Status: DC

## 2022-08-05 MED ORDER — SEMAGLUTIDE(0.25 OR 0.5MG/DOS) 2 MG/1.5ML ~~LOC~~ SOPN
0.2500 mg | PEN_INJECTOR | SUBCUTANEOUS | 0 refills | Status: DC
Start: 2022-08-05 — End: 2022-09-26

## 2022-08-05 NOTE — Telephone Encounter (Signed)
Pt is calling to report [facility] does not have Semaglutide-Weight Management 0.25 MG/0.5ML SOAJ [[id number]] . Pharmacy states that if ozempic or mounjaro be called in that medication is in stock CB--[phone]

## 2022-08-05 NOTE — Telephone Encounter (Signed)
No answer from pt left vm to call back.

## 2022-08-05 NOTE — Telephone Encounter (Signed)
Spoke to patient relayed your message. She states if you can prescribe ozempic or mounjaro to that pharmacy since [facility] is not in stock that pharmacy has the other 2 options.

## 2022-08-05 NOTE — Telephone Encounter (Signed)
Pt called back but nurse was in a room.

## 2022-08-05 NOTE — Telephone Encounter (Signed)
Relayed you message to patient. Pt insists to get Ozempic sent over to [facility], [address] AT [address].

## 2022-08-05 NOTE — Telephone Encounter (Signed)
Please advice  

## 2022-08-05 NOTE — Telephone Encounter (Signed)
Pt would like to try ozempic and see if it covers please send to pharmacy as requested on first message per patient/

## 2022-08-05 NOTE — Addendum Note (Signed)
Addended by: Delsa Grana on: 08/05/2022 04:27 PM   Modules accepted: Orders

## 2022-08-05 NOTE — Progress Notes (Signed)
Patient ID: BONITA BRINDISI, female    DOB: November 21, 1971, 51 y.o.   MRN: [id number]  PCP: Steele Sizer, MD  Chief Complaint  Patient presents with   Weight Loss    Pt interested in weight loss medication.    Subjective:   CYBELE MAULE is a 51 y.o. female, presents to clinic with CC of the following:  HPI   Pt has new insurance and wants ozempic for weight loss  She checked with Dr. Jacinto Reap about ozempic as an option with her cancer treatment - no hx of pancreatitis, MEN type 2, thyroid cancer  Obesity - always had troubles with weight Did herbalife for years - it helped Highest weight in the past 279 No hx of DM Only RD consult in the past with n/v chemo SE and not eating while on treatment Did phentermine in the past Wt Readings from Last 30 Encounters:  08/05/22 242 lb 9.6 oz (110 kg)  07/08/22 234 lb 12.8 oz (106.5 kg)  06/17/22 247 lb 11.2 oz (112.4 kg)  06/17/22 246 lb 3.2 oz (111.7 kg)  05/20/22 247 lb (112 kg)  05/17/22 243 lb (110.2 kg)  04/22/22 246 lb 3.2 oz (111.7 kg)  03/25/22 247 lb 3.2 oz (112.1 kg)  02/25/22 249 lb 11.2 oz (113.3 kg)  01/18/22 255 lb 3.2 oz (115.8 kg)  12/14/21 258 lb (117 kg)  11/24/21 256 lb 6.4 oz (116.3 kg)  11/02/21 267 lb (121.1 kg)  10/06/21 264 lb 4.8 oz (119.9 kg)  10/06/21 264 lb 4.8 oz (119.9 kg)  09/13/21 268 lb (121.6 kg)  08/31/21 271 lb (122.9 kg)  08/12/21 268 lb (121.6 kg)  07/12/21 279 lb (126.6 kg)  06/22/21 271 lb 6.4 oz (123.1 kg)  06/14/21 279 lb 9.6 oz (126.8 kg)  04/26/21 263 lb 6.4 oz (119.5 kg)  04/21/21 263 lb 6.4 oz (119.5 kg)  02/26/21 270 lb (122.5 kg)  02/17/21 260 lb (117.9 kg)  02/05/21 271 lb (122.9 kg)  01/14/21 274 lb 9.6 oz (124.6 kg)  01/14/21 273 lb 12.8 oz (124.2 kg)  01/07/21 272 lb (123.4 kg)  12/24/20 269 lb 14.4 oz (122.4 kg)   BMI Readings from Last 5 Encounters:  08/05/22 42.97 kg/m  07/08/22 41.59 kg/m  06/17/22 43.88 kg/m  06/17/22 43.61 kg/m  05/20/22 43.75 kg/m    Pulse HR good, no palpitations, metoprolol  Pulse Readings from Last 3 Encounters:  08/05/22 89  07/08/22 60  06/17/22 76   F/up on depression/anxiety adjustment disorder and med changes She states some things in her life are improved and that has helped her mood On cymbalta 60 mg, wellbutrin 300    08/05/2022    9:18 AM 06/17/2022    2:31 PM 05/17/2022   11:24 AM 04/13/2022    3:19 PM 08/31/2021    1:43 PM  Depression screen PHQ 2/9  Decreased Interest 2 2 2 1 1   Down, Depressed, Hopeless 2 2 2 1 1   PHQ - 2 Score 4 4 4 2 2   Altered sleeping 2 2 3 1  0  Tired, decreased energy 2 2 3 1 3   Change in appetite 0 2 1 0 2  Feeling bad or failure about yourself  0 1 2 1 1   Trouble concentrating 0 1 2 1  0  Moving slowly or fidgety/restless 0 0 0 0 0  Suicidal thoughts 0 0 0 0 0  PHQ-9 Score 8 12 15 6 8   Difficult doing work/chores Somewhat difficult  Somewhat difficult  Somewhat difficult    Phq score positive but improved some compared to last OV         Patient Active Problem List   Diagnosis Date Noted   Moderate episode of recurrent major depressive disorder ([place]) 06/17/2022   Smokers' cough ([place]) 05/17/2022   Dyslipidemia 05/17/2022   Elevated TSH 05/17/2022   Pre-diabetes 05/17/2022   Ventral hernia without obstruction or gangrene 04/26/2021   Malignant ascites 12/22/2019   Pleural effusion on right 12/22/2019   Tachycardia 12/22/2019   Primary peritoneal carcinomatosis ([place]) 12/16/2019   Goals of care, counseling/discussion 12/16/2019   Erythrocytosis 11/12/2019   Morbid obesity ([place]) 07/07/2018   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   Fever blister 05/17/2018   Family history of breast cancer 05/08/2018   Family history of colon cancer in mother 05/08/2018   Migraine with aura and without status migrainosus 04/18/2018   Mild recurrent major depression ([place]) 01/20/2016   Esophageal reflux 01/20/2016   Osteoarthritis, multiple sites 01/20/2016      Current Outpatient  Medications:    budesonide-formoterol (SYMBICORT) 160-4.5 MCG/ACT inhaler, Inhale 2 puffs into the lungs 2 (two) times daily., Disp: 1 each, Rfl: 3   buPROPion (WELLBUTRIN XL) 150 MG 24 hr tablet, Take 1 tablet (150 mg total) by mouth daily., Disp: 90 tablet, Rfl: 0   cloNIDine (CATAPRES - DOSED IN MG/24 HR) 0.3 mg/24hr patch, Place 1 patch (0.3 mg total) onto the skin once a week., Disp: 12 patch, Rfl: 3   cloNIDine (CATAPRES) 0.1 MG tablet, Take 1 tablet (0.1 mg total) by mouth at bedtime., Disp: 90 tablet, Rfl: 3   clotrimazole (LOTRIMIN) 1 % cream, Apply 1 application. topically 2 (two) times daily., Disp: 30 g, Rfl: 0   DULoxetine (CYMBALTA) 60 MG capsule, Take 1 capsule (60 mg total) by mouth daily., Disp: 90 capsule, Rfl: 1   eszopiclone (LUNESTA) 2 MG TABS tablet, Take 1 tablet (2 mg total) by mouth at bedtime as needed for sleep. Take immediately before bedtime, Disp: 30 tablet, Rfl: 2   loratadine (CLARITIN) 10 MG tablet, TAKE 1 TABLET(10 MG) BY MOUTH EVERY MORNING, Disp: 90 tablet, Rfl: 3   LORazepam (ATIVAN) 0.5 MG tablet, Take 1 tablet (0.5 mg total) by mouth every 8 (eight) hours as needed for anxiety., Disp: 60 tablet, Rfl: 0   MAGNESIUM CITRATE PO, 2 tablets daily., Disp: , Rfl:    metoprolol succinate (TOPROL-XL) 25 MG 24 hr tablet, TAKE 1/2 TABLET(12.5 MG) BY MOUTH DAILY, Disp: 45 tablet, Rfl: 1   Multiple Vitamin (MULTIVITAMIN WITH MINERALS) TABS tablet, Take 1 tablet by mouth daily., Disp: , Rfl:    nystatin (MYCOSTATIN/NYSTOP) powder, Apply 1 application. topically 3 (three) times daily., Disp: 15 g, Rfl: 0   olaparib (LYNPARZA) 100 MG tablet, Take 2 tablets (200 mg total) by mouth 2 (two) times daily. May take with food to decrease nausea and vomiting., Disp: 120 tablet, Rfl: 3   omeprazole (PRILOSEC) 20 MG capsule, TAKE 1 CAPSULE(20 MG) BY MOUTH DAILY, Disp: 90 capsule, Rfl: 2   ondansetron (ZOFRAN-ODT) 8 MG disintegrating tablet, Take 1 tablet (8 mg total) by mouth every 8  (eight) hours as needed for nausea or vomiting., Disp: 30 tablet, Rfl: 3   oxyCODONE (OXY IR/ROXICODONE) 5 MG immediate release tablet, Take 0.5-1 tablets (2.5-5 mg total) by mouth every 6 (six) hours as needed for severe pain., Disp: 60 tablet, Rfl: 0   polyethylene glycol powder (GLYCOLAX/MIRALAX) 17 GM/SCOOP powder, Take 0.5 Containers by mouth daily  as needed for mild constipation or moderate constipation., Disp: , Rfl:    pregabalin (LYRICA) 150 MG capsule, Take 150 mg by mouth 3 (three) times daily., Disp: , Rfl:    prochlorperazine (COMPAZINE) 10 MG tablet, Take 1 tablet (10 mg total) by mouth every 6 (six) hours as needed for nausea or vomiting., Disp: 40 tablet, Rfl: 3   senna (SENOKOT) 8.6 MG tablet, Take 1 tablet by mouth daily as needed for constipation., Disp: , Rfl:    SUMAtriptan (IMITREX) 100 MG tablet, Take 1 tablet (100 mg total) by mouth every 2 (two) hours as needed for migraine. May repeat in 2 hours if headache persists or recurs., Disp: 10 tablet, Rfl: 2   valACYclovir (VALTREX) 1000 MG tablet, Take 1 tablet (1,000 mg total) by mouth 2 (two) times daily as needed., Disp: 6 tablet, Rfl: 2 No current facility-administered medications for this visit.  Facility-Administered Medications Ordered in Other Visits:    heparin lock flush 100 unit/mL, 250 Units, Intracatheter, Once PRN, Louretta Shorten R, MD   sodium chloride flush (NS) 0.9 % injection 10 mL, 10 mL, Intravenous, Once, Brahmanday, Worthy Flank, MD   No Known Allergies   Social History   Tobacco Use   Smoking status: Every Day    Packs/day: 1.00    Years: 30.00    Total pack years: 30.00    Types: Cigarettes   Smokeless tobacco: Never  Vaping Use   Vaping Use: Never used  Substance Use Topics   Alcohol use: Not Currently    Alcohol/week: 0.0 standard drinks of alcohol   Drug use: No      Chart Review Today: I personally reviewed active problem list, medication list, allergies, family history, social  history, health maintenance, notes from last encounter, lab results, imaging with the patient/caregiver today.   Review of Systems  Constitutional: Negative.   HENT: Negative.    Eyes: Negative.   Respiratory: Negative.    Cardiovascular: Negative.   Gastrointestinal: Negative.   Endocrine: Negative.   Genitourinary: Negative.   Musculoskeletal: Negative.   Skin: Negative.   Allergic/Immunologic: Negative.   Neurological: Negative.   Hematological: Negative.   Psychiatric/Behavioral: Negative.    All other systems reviewed and are negative.      Objective:   Vitals:   08/05/22 0923  BP: 108/72  Pulse: 89  Resp: 16  Temp: 98 F (36.7 C)  TempSrc: Oral  SpO2: 99%  Weight: 242 lb 9.6 oz (110 kg)  Height: 5\' 3"  (1.6 m)    Body mass index is 42.97 kg/m.  Physical Exam Vitals and nursing note reviewed.  Constitutional:      General: She is not in acute distress.    Appearance: Normal appearance. She is well-developed and well-groomed. She is obese. She is not ill-appearing, toxic-appearing or diaphoretic.  HENT:     Head: Normocephalic and atraumatic.     Nose: Nose normal.  Eyes:     General:        Right eye: No discharge.        Left eye: No discharge.     Conjunctiva/sclera: Conjunctivae normal.  Neck:     Trachea: No tracheal deviation.  Cardiovascular:     Rate and Rhythm: Normal rate and regular rhythm.  Pulmonary:     Effort: Pulmonary effort is normal. No respiratory distress.     Breath sounds: No stridor.  Musculoskeletal:        General: Normal range of motion.  Skin:  General: Skin is warm and dry.     Findings: No rash.  Neurological:     Mental Status: She is alert.     Motor: No abnormal muscle tone.     Coordination: Coordination normal.  Psychiatric:        Attention and Perception: Attention and perception normal.        Mood and Affect: Mood and affect normal.        Speech: Speech normal.        Behavior: Behavior normal.  Behavior is cooperative.      Results for orders placed or performed in visit on 07/08/22  Protein / Creatinine Ratio, Urine  Result Value Ref Range   Creatinine, Urine 208 mg/dL   Total Protein, Urine 17 mg/dL   Protein Creatinine Ratio 0.08 0.00 - 0.15 mg/mg[Cre]  Urinalysis, Complete w Microscopic  Result Value Ref Range   Color, Urine YELLOW (A) YELLOW   APPearance CLOUDY (A) CLEAR   Specific Gravity, Urine 1.019 1.005 - 1.030   pH 6.0 5.0 - 8.0   Glucose, UA NEGATIVE NEGATIVE mg/dL   Hgb urine dipstick NEGATIVE NEGATIVE   Bilirubin Urine NEGATIVE NEGATIVE   Ketones, ur NEGATIVE NEGATIVE mg/dL   Protein, ur NEGATIVE NEGATIVE mg/dL   Nitrite NEGATIVE NEGATIVE   Leukocytes,Ua NEGATIVE NEGATIVE   RBC / HPF 0-5 0 - 5 RBC/hpf   WBC, UA 6-10 0 - 5 WBC/hpf   Bacteria, UA FEW (A) NONE SEEN   Squamous Epithelial / LPF 11-20 0 - 5   Mucus PRESENT    Hyaline Casts, UA PRESENT   CA 125  Result Value Ref Range   Cancer Antigen (CA) 125 9.8 0.0 - 38.1 U/mL  Comprehensive metabolic panel  Result Value Ref Range   Sodium 136 135 - 145 mmol/L   Potassium 4.0 3.5 - 5.1 mmol/L   Chloride 105 98 - 111 mmol/L   CO2 25 22 - 32 mmol/L   Glucose, Bld 105 (H) 70 - 99 mg/dL   BUN 9 6 - 20 mg/dL   Creatinine, Ser 0.81 0.44 - 1.00 mg/dL   Calcium 8.8 (L) 8.9 - 10.3 mg/dL   Total Protein 7.0 6.5 - 8.1 g/dL   Albumin 3.9 3.5 - 5.0 g/dL   AST 23 15 - 41 U/L   ALT 17 0 - 44 U/L   Alkaline Phosphatase 56 38 - 126 U/L   Total Bilirubin 0.8 0.3 - 1.2 mg/dL   GFR, Estimated >60 >60 mL/min   Anion gap 6 5 - 15  CBC with Differential  Result Value Ref Range   WBC 4.8 4.0 - 10.5 K/uL   RBC 3.71 (L) 3.87 - 5.11 MIL/uL   Hemoglobin 15.1 (H) 12.0 - 15.0 g/dL   HCT 43.1 36.0 - 46.0 %   MCV 116.2 (H) 80.0 - 100.0 fL   MCH 40.7 (H) 26.0 - 34.0 pg   MCHC 35.0 30.0 - 36.0 g/dL   RDW 14.6 11.5 - 15.5 %   Platelets 117 (L) 150 - 400 K/uL   nRBC 0.0 0.0 - 0.2 %   Neutrophils Relative % 62 %   Neutro  Abs 3.0 1.7 - 7.7 K/uL   Lymphocytes Relative 29 %   Lymphs Abs 1.4 0.7 - 4.0 K/uL   Monocytes Relative 6 %   Monocytes Absolute 0.3 0.1 - 1.0 K/uL   Eosinophils Relative 2 %   Eosinophils Absolute 0.1 0.0 - 0.5 K/uL   Basophils Relative 1 %  Basophils Absolute 0.0 0.0 - 0.1 K/uL   Immature Granulocytes 0 %   Abs Immature Granulocytes 0.01 0.00 - 0.07 K/uL       Assessment & Plan:   1. Moderate episode of recurrent major depressive disorder (HCC) Phq score and sx improved some Continue cymbalta and wellbutrin, continue to work with oncology SW/psych or therapy resources - buPROPion (WELLBUTRIN XL) 300 MG 24 hr tablet; Take 1 tablet (300 mg total) by mouth daily.  Dispense: 90 tablet; Refill: 0   2. Morbid obesity ([place]) Reviewed healthy diet/lifestyle choices Nutrition consult Watch/monitor and log calorie intake to see if she needs to reduce calories  No contraindications to GLP-1 meds pt requests Explained cost and availability may be prohibitive right now Sent to requested pharmacy and then sent in several different ways and to additional pharmacies per multiple requests today  - Semaglutide-Weight Management 0.25 MG/0.5ML SOAJ; Inject 0.25 mg into the skin once a week for 28 days.  Dispense: 2 mL; Refill: 0 - Semaglutide-Weight Management 0.5 MG/0.5ML SOAJ; Inject 0.5 mg into the skin once a week for 28 days.  Dispense: 2 mL; Refill: 0 - Semaglutide-Weight Management 1 MG/0.5ML SOAJ; Inject 1 mg into the skin once a week for 28 days.  Dispense: 2 mL; Refill: 0 - Semaglutide-Weight Management 1.7 MG/0.75ML SOAJ; Inject 1.7 mg into the skin once a week for 28 days.  Dispense: 3 mL; Refill: 0 - Semaglutide-Weight Management 2.4 MG/0.75ML SOAJ; Inject 2.4 mg into the skin once a week for 28 days.  Dispense: 3 mL; Refill: 0 - Amb ref to Medical Nutrition Therapy-MNT 3. Pre-diabetes  - Semaglutide-Weight Management 0.25 MG/0.5ML SOAJ; Inject 0.25 mg into the skin once a week for 28  days.  Dispense: 2 mL; Refill: 0 - Semaglutide-Weight Management 0.5 MG/0.5ML SOAJ; Inject 0.5 mg into the skin once a week for 28 days.  Dispense: 2 mL; Refill: 0 - Semaglutide-Weight Management 1 MG/0.5ML SOAJ; Inject 1 mg into the skin once a week for 28 days.  Dispense: 2 mL; Refill: 0 - Semaglutide-Weight Management 1.7 MG/0.75ML SOAJ; Inject 1.7 mg into the skin once a week for 28 days.  Dispense: 3 mL; Refill: 0 - Semaglutide-Weight Management 2.4 MG/0.75ML SOAJ; Inject 2.4 mg into the skin once a week for 28 days.  Dispense: 3 mL; Refill: 0 - Amb ref to Medical Nutrition Therapy-MNT  4. Dyslipidemia  - Semaglutide-Weight Management 0.25 MG/0.5ML SOAJ; Inject 0.25 mg into the skin once a week for 28 days.  Dispense: 2 mL; Refill: 0 - Semaglutide-Weight Management 0.5 MG/0.5ML SOAJ; Inject 0.5 mg into the skin once a week for 28 days.  Dispense: 2 mL; Refill: 0 - Semaglutide-Weight Management 1 MG/0.5ML SOAJ; Inject 1 mg into the skin once a week for 28 days.  Dispense: 2 mL; Refill: 0 - Semaglutide-Weight Management 1.7 MG/0.75ML SOAJ; Inject 1.7 mg into the skin once a week for 28 days.  Dispense: 3 mL; Refill: 0 - Semaglutide-Weight Management 2.4 MG/0.75ML SOAJ; Inject 2.4 mg into the skin once a week for 28 days.  Dispense: 3 mL; Refill: 0 - Amb ref to Medical Nutrition Therapy-MNT  5. Metabolic syndrome  - Semaglutide-Weight Management 0.25 MG/0.5ML SOAJ; Inject 0.25 mg into the skin once a week for 28 days.  Dispense: 2 mL; Refill: 0 - Semaglutide-Weight Management 0.5 MG/0.5ML SOAJ; Inject 0.5 mg into the skin once a week for 28 days.  Dispense: 2 mL; Refill: 0 - Semaglutide-Weight Management 1 MG/0.5ML SOAJ; Inject 1 mg into  the skin once a week for 28 days.  Dispense: 2 mL; Refill: 0 - Semaglutide-Weight Management 1.7 MG/0.75ML SOAJ; Inject 1.7 mg into the skin once a week for 28 days.  Dispense: 3 mL; Refill: 0 - Semaglutide-Weight Management 2.4 MG/0.75ML SOAJ; Inject 2.4 mg  into the skin once a week for 28 days.  Dispense: 3 mL; Refill: 0 - Amb ref to Medical Nutrition Therapy-MNT  6. Encounter for weight management  - Semaglutide-Weight Management 0.25 MG/0.5ML SOAJ; Inject 0.25 mg into the skin once a week for 28 days.  Dispense: 2 mL; Refill: 0 - Semaglutide-Weight Management 0.5 MG/0.5ML SOAJ; Inject 0.5 mg into the skin once a week for 28 days.  Dispense: 2 mL; Refill: 0 - Semaglutide-Weight Management 1 MG/0.5ML SOAJ; Inject 1 mg into the skin once a week for 28 days.  Dispense: 2 mL; Refill: 0 - Semaglutide-Weight Management 1.7 MG/0.75ML SOAJ; Inject 1.7 mg into the skin once a week for 28 days.  Dispense: 3 mL; Refill: 0 - Semaglutide-Weight Management 2.4 MG/0.75ML SOAJ; Inject 2.4 mg into the skin once a week for 28 days.  Dispense: 3 mL; Refill: 0 - Amb ref to Medical Nutrition Therapy-MNT   Explained the med MOA, SE   Ex 1-2 month f/up for med and weight recheck      Delsa Grana, PA-C 08/05/22 10:10 AM

## 2022-08-05 NOTE — Telephone Encounter (Signed)
Called pt twice and no answer

## 2022-08-08 ENCOUNTER — Other Ambulatory Visit: Payer: Self-pay | Admitting: Internal Medicine

## 2022-08-08 ENCOUNTER — Other Ambulatory Visit: Payer: Self-pay | Admitting: Hospice and Palliative Medicine

## 2022-08-08 DIAGNOSIS — Z7189 Other specified counseling: Secondary | ICD-10-CM

## 2022-08-08 DIAGNOSIS — C482 Malignant neoplasm of peritoneum, unspecified: Secondary | ICD-10-CM

## 2022-08-08 MED ORDER — OXYCODONE HCL 5 MG PO TABS: 2.5000 mg | ORAL_TABLET | Freq: Four times a day (QID) | ORAL | 0 refills | Status: DC | PRN

## 2022-08-09 ENCOUNTER — Inpatient Hospital Stay (HOSPITAL_BASED_OUTPATIENT_CLINIC_OR_DEPARTMENT_OTHER): Payer: Medicare Other | Admitting: Internal Medicine

## 2022-08-09 ENCOUNTER — Encounter: Payer: Self-pay | Admitting: Internal Medicine

## 2022-08-09 ENCOUNTER — Inpatient Hospital Stay: Payer: Medicare Other

## 2022-08-09 VITALS — BP 120/78 | HR 76

## 2022-08-09 VITALS — BP 125/99 | HR 76 | Temp 96.3°F | Ht 63.0 in | Wt 239.0 lb

## 2022-08-09 DIAGNOSIS — Z7189 Other specified counseling: Secondary | ICD-10-CM

## 2022-08-09 DIAGNOSIS — C482 Malignant neoplasm of peritoneum, unspecified: Secondary | ICD-10-CM

## 2022-08-09 DIAGNOSIS — F17210 Nicotine dependence, cigarettes, uncomplicated: Secondary | ICD-10-CM | POA: Diagnosis not present

## 2022-08-09 DIAGNOSIS — Z79899 Other long term (current) drug therapy: Secondary | ICD-10-CM | POA: Diagnosis not present

## 2022-08-09 DIAGNOSIS — Z5112 Encounter for antineoplastic immunotherapy: Secondary | ICD-10-CM | POA: Diagnosis not present

## 2022-08-09 LAB — URINALYSIS, DIPSTICK ONLY
Bilirubin Urine: NEGATIVE
Glucose, UA: NEGATIVE mg/dL
Hgb urine dipstick: NEGATIVE
Ketones, ur: NEGATIVE mg/dL
Leukocytes,Ua: NEGATIVE
Nitrite: NEGATIVE
Protein, ur: 30 mg/dL — AB
Specific Gravity, Urine: 1.033 — ABNORMAL HIGH (ref 1.005–1.030)
pH: 5 (ref 5.0–8.0)

## 2022-08-09 LAB — CBC WITH DIFFERENTIAL/PLATELET
Abs Immature Granulocytes: 0.03 10*3/uL (ref 0.00–0.07)
Basophils Absolute: 0.1 10*3/uL (ref 0.0–0.1)
Basophils Relative: 1 %
Eosinophils Absolute: 0 10*3/uL (ref 0.0–0.5)
Eosinophils Relative: 0 %
HCT: 48 % — ABNORMAL HIGH (ref 36.0–46.0)
Hemoglobin: 16.6 g/dL — ABNORMAL HIGH (ref 12.0–15.0)
Immature Granulocytes: 0 %
Lymphocytes Relative: 19 %
Lymphs Abs: 1.8 10*3/uL (ref 0.7–4.0)
MCH: 39.8 pg — ABNORMAL HIGH (ref 26.0–34.0)
MCHC: 34.6 g/dL (ref 30.0–36.0)
MCV: 115.1 fL — ABNORMAL HIGH (ref 80.0–100.0)
Monocytes Absolute: 0.8 10*3/uL (ref 0.1–1.0)
Monocytes Relative: 9 %
Neutro Abs: 6.9 10*3/uL (ref 1.7–7.7)
Neutrophils Relative %: 71 %
Platelets: 183 10*3/uL (ref 150–400)
RBC: 4.17 MIL/uL (ref 3.87–5.11)
RDW: 14.6 % (ref 11.5–15.5)
WBC: 9.6 10*3/uL (ref 4.0–10.5)
nRBC: 0 % (ref 0.0–0.2)

## 2022-08-09 LAB — COMPREHENSIVE METABOLIC PANEL
ALT: 15 U/L (ref 0–44)
AST: 24 U/L (ref 15–41)
Albumin: 4.3 g/dL (ref 3.5–5.0)
Alkaline Phosphatase: 62 U/L (ref 38–126)
Anion gap: 9 (ref 5–15)
BUN: 16 mg/dL (ref 6–20)
CO2: 24 mmol/L (ref 22–32)
Calcium: 9.7 mg/dL (ref 8.9–10.3)
Chloride: 107 mmol/L (ref 98–111)
Creatinine, Ser: 0.88 mg/dL (ref 0.44–1.00)
GFR, Estimated: >60 mL/min (ref >60)
Glucose, Bld: 129 mg/dL — ABNORMAL HIGH (ref 70–99)
Potassium: 3.7 mmol/L (ref 3.5–5.1)
Sodium: 140 mmol/L (ref 135–145)
Total Bilirubin: 0.5 mg/dL (ref 0.3–1.2)
Total Protein: 8.1 g/dL (ref 6.5–8.1)

## 2022-08-09 LAB — PROTEIN / CREATININE RATIO, URINE
Creatinine, Urine: 310 mg/dL
Protein Creatinine Ratio: 0.12 mg/mg{Cre} (ref 0.00–0.15)
Total Protein, Urine: 38 mg/dL

## 2022-08-09 MED ORDER — ONDANSETRON HCL 4 MG/2ML IJ SOLN
INTRAMUSCULAR | Status: AC
  Filled 2022-08-09: qty 2

## 2022-08-09 MED ORDER — HEPARIN SOD (PORK) LOCK FLUSH 100 UNIT/ML IV SOLN
500.0000 [IU] | Freq: Once | INTRAVENOUS | Status: AC | PRN
  Administered 2022-08-09: 500 [IU]
  Filled 2022-08-09: qty 5

## 2022-08-09 MED ORDER — SODIUM CHLORIDE 0.9 % IV SOLN
15.0000 mg/kg | Freq: Once | INTRAVENOUS | Status: AC
  Administered 2022-08-09: 1700 mg via INTRAVENOUS
  Filled 2022-08-09: qty 64

## 2022-08-09 MED ORDER — ONDANSETRON HCL 4 MG/2ML IJ SOLN
4.0000 mg | Freq: Once | INTRAMUSCULAR | Status: AC
  Administered 2022-08-09: 4 mg via INTRAVENOUS

## 2022-08-09 MED ORDER — SODIUM CHLORIDE 0.9 % IV SOLN
Freq: Once | INTRAVENOUS | Status: AC
  Filled 2022-08-09: qty 250
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
## 2022-08-09 NOTE — Progress Notes (Signed)
C/o having more nausea lately.  After last treatment she got a cold that turned into an ear infection, was put on an antibiotic, and now has a yeast infection, can you give a rx for diflucan?

## 2022-08-09 NOTE — Patient Instructions (Addendum)
[facility] AT [place]  Discharge Instructions: Thank you for choosing [facility] to provide your oncology and hematology care.  If you have a lab appointment with the [facility], please go directly to the [facility] and check in at the registration area.  Wear comfortable clothing and clothing appropriate for easy access to any Portacath or PICC line.   We strive to give you quality time with your provider. You may need to reschedule your appointment if you arrive late (15 or more minutes).  Arriving late affects you and other patients whose appointments are after yours.  Also, if you miss three or more appointments without notifying the office, you may be dismissed from the clinic at the provider's discretion.      For prescription refill requests, have your pharmacy contact our office and allow 72 hours for refills to be completed.    Today you received the following chemotherapy and/or immunotherapy agents: bevacizumab   To help prevent nausea and vomiting after your treatment, we encourage you to take your nausea medication as directed.  BELOW ARE SYMPTOMS THAT SHOULD BE REPORTED IMMEDIATELY: *FEVER GREATER THAN 100.4 F (38 C) OR HIGHER *CHILLS OR SWEATING *NAUSEA AND VOMITING THAT IS NOT CONTROLLED WITH YOUR NAUSEA MEDICATION *UNUSUAL SHORTNESS OF BREATH *UNUSUAL BRUISING OR BLEEDING *URINARY PROBLEMS (pain or burning when urinating, or frequent urination) *BOWEL PROBLEMS (unusual diarrhea, constipation, pain near the anus) TENDERNESS IN MOUTH AND THROAT WITH OR WITHOUT PRESENCE OF ULCERS (sore throat, sores in mouth, or a toothache) UNUSUAL RASH, SWELLING OR PAIN  UNUSUAL VAGINAL DISCHARGE OR ITCHING   Items with * indicate a potential emergency and should be followed up as soon as possible or go to the Emergency Department if any problems should occur.  Please show the CHEMOTHERAPY ALERT CARD or IMMUNOTHERAPY ALERT CARD at check-in to  the Emergency Department and triage nurse.  Should you have questions after your visit or need to cancel or reschedule your appointment, please contact [facility] [place] AT [place]  [phone] and follow the prompts.  Office hours are 8:00 a.m. to 4:30 p.m. Monday - Friday. Please note that voicemails left after 4:00 p.m. may not be returned until the following business day.  We are closed weekends and major holidays. You have access to a nurse at all times for urgent questions. Please call the main number to the clinic [phone] and follow the prompts.  For any non-urgent questions, you may also contact your provider using MyChart. We now offer e-Visits for anyone 64 and older to request care online for non-urgent symptoms. For details visit [email or web address].   Also download the MyChart app! Go to the app store, search "MyChart", open the app, select [facility], and log in with your MyChart username and password.  Masks are optional in the cancer centers. If you would like for your care team to wear a mask while they are taking care of you, please let them know. For doctor visits, patients may have with them one support person who is at least 50 years old. At this time, visitors are not allowed in the infusion area.  [facility] AT [place]  Discharge Instructions: Thank you for choosing [facility] to provide your oncology and hematology care.   If you have a lab appointment with the [facility], please go directly to the [name] and check in at the registration area.   Wear comfortable clothing and clothing appropriate for  easy access to any Portacath or PICC line.   We strive to give you quality time with your provider. You may need to reschedule your appointment if you arrive late (15 or more minutes).  Arriving late affects you and other patients whose appointments are after yours.  Also, if you miss three or more  appointments without notifying the office, you may be dismissed from the clinic at the provider's discretion.      For prescription refill requests, have your pharmacy contact our office and allow 72 hours for refills to be completed.    Today you received the following chemotherapy and/or immunotherapy agents       To help prevent nausea and vomiting after your treatment, we encourage you to take your nausea medication as directed.  BELOW ARE SYMPTOMS THAT SHOULD BE REPORTED IMMEDIATELY: *FEVER GREATER THAN 100.4 F (38 C) OR HIGHER *CHILLS OR SWEATING *NAUSEA AND VOMITING THAT IS NOT CONTROLLED WITH YOUR NAUSEA MEDICATION *UNUSUAL SHORTNESS OF BREATH *UNUSUAL BRUISING OR BLEEDING *URINARY PROBLEMS (pain or burning when urinating, or frequent urination) *BOWEL PROBLEMS (unusual diarrhea, constipation, pain near the anus) TENDERNESS IN MOUTH AND THROAT WITH OR WITHOUT PRESENCE OF ULCERS (sore throat, sores in mouth, or a toothache) UNUSUAL RASH, SWELLING OR PAIN  UNUSUAL VAGINAL DISCHARGE OR ITCHING   Items with * indicate a potential emergency and should be followed up as soon as possible or go to the Emergency Department if any problems should occur.  Please show the CHEMOTHERAPY ALERT CARD or IMMUNOTHERAPY ALERT CARD at check-in to the Emergency Department and triage nurse.  Should you have questions after your visit or need to cancel or reschedule your appointment, please contact [facility] AT [place]  Dept: [phone]  and follow the prompts.  Office hours are 8:00 a.m. to 4:30 p.m. Monday - Friday. Please note that voicemails left after 4:00 p.m. may not be returned until the following business day.  We are closed weekends and major holidays. You have access to a nurse at all times for urgent questions. Please call the main number to the clinic Dept: [phone] and follow the prompts.   For any non-urgent questions, you may also contact your provider using  MyChart. We now offer e-Visits for anyone 49 and older to request care online for non-urgent symptoms. For details visit [email or web address].   Also download the MyChart app! Go to the app store, search "MyChart", open the app, select , and log in with your MyChart username and password.  Masks are optional in the cancer centers. If you would like for your care team to wear a mask while they are taking care of you, please let them know. You may have one support person who is at least 50 years old accompany you for your appointments.

## 2022-08-09 NOTE — Assessment & Plan Note (Addendum)
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 On avastin + lynparza-STABLE.  CT AP April 2023- NED.  #  PROCEED WITH Avastin treatment today [see discussion below].  Labs reviewed.;  CBC- platelets >100; CMP are reviewed; adequate today.JUNE  2023-NEG.  Continue Lynparza 200 mg twice a day [severe anemia while on 250 mg twice a day].  Ca1 2 5 -normal limits however rising.  Will repeat CT CAP at next visit.  # Hypertension: Blood pressure 140/94  Monitor closely on Avastin.   Continue metoprolol/clonidine hot flashes. STABLE.  # PN G-2/back pain [awaiting steroid injection]- on Lyrica 50 mg TID-STABLE.  # Nausea- continue Zofran/ compazine prn  STABLE.   #IV access/Mediport-stable/port flush  # DISPOSITION: schedule 10-11 am appt #  avastin today- ok with out urine protein # in 3 weeks - MD;   Avastin; port/labs-  UA; cbc/cmp; ca-125; urin random protein:creatiine ratio; CT CAP Dr.B

## 2022-08-09 NOTE — Progress Notes (Signed)
Per checklist note, MD ok to start avastin without todays' urine protein result.

## 2022-08-10 LAB — CA 125: Cancer Antigen (CA) 125: 15.7 U/mL (ref 0.0–38.1)

## 2022-08-17 DIAGNOSIS — M5416 Radiculopathy, lumbar region: Secondary | ICD-10-CM | POA: Diagnosis not present

## 2022-08-26 ENCOUNTER — Ambulatory Visit: Admission: RE | Admit: 2022-08-26 | Payer: Medicare Other | Source: Ambulatory Visit

## 2022-08-30 ENCOUNTER — Ambulatory Visit
Admission: RE | Admit: 2022-08-30 | Discharge: 2022-08-30 | Disposition: A | Discharge status: Home / Self Care | Payer: Medicare Other | Source: Ambulatory Visit | Attending: Internal Medicine | Admitting: Internal Medicine

## 2022-08-30 ENCOUNTER — Inpatient Hospital Stay: Payer: Medicare Other | Attending: Internal Medicine | Admitting: Internal Medicine

## 2022-08-30 ENCOUNTER — Inpatient Hospital Stay: Payer: Medicare Other

## 2022-08-30 ENCOUNTER — Encounter: Payer: Self-pay | Admitting: Internal Medicine

## 2022-08-30 ENCOUNTER — Inpatient Hospital Stay (HOSPITAL_BASED_OUTPATIENT_CLINIC_OR_DEPARTMENT_OTHER): Payer: Medicare Other | Admitting: *Deleted

## 2022-08-30 DIAGNOSIS — Z7189 Other specified counseling: Secondary | ICD-10-CM

## 2022-08-30 DIAGNOSIS — C482 Malignant neoplasm of peritoneum, unspecified: Secondary | ICD-10-CM

## 2022-08-30 DIAGNOSIS — Z90710 Acquired absence of both cervix and uterus: Secondary | ICD-10-CM | POA: Diagnosis not present

## 2022-08-30 DIAGNOSIS — Z79899 Other long term (current) drug therapy: Secondary | ICD-10-CM | POA: Insufficient documentation

## 2022-08-30 DIAGNOSIS — F17210 Nicotine dependence, cigarettes, uncomplicated: Secondary | ICD-10-CM | POA: Diagnosis not present

## 2022-08-30 DIAGNOSIS — Z452 Encounter for adjustment and management of vascular access device: Secondary | ICD-10-CM | POA: Diagnosis not present

## 2022-08-30 DIAGNOSIS — K439 Ventral hernia without obstruction or gangrene: Secondary | ICD-10-CM | POA: Diagnosis not present

## 2022-08-30 DIAGNOSIS — Z5112 Encounter for antineoplastic immunotherapy: Secondary | ICD-10-CM | POA: Diagnosis not present

## 2022-08-30 DIAGNOSIS — C569 Malignant neoplasm of unspecified ovary: Secondary | ICD-10-CM | POA: Diagnosis not present

## 2022-08-30 DIAGNOSIS — K760 Fatty (change of) liver, not elsewhere classified: Secondary | ICD-10-CM | POA: Diagnosis not present

## 2022-08-30 DIAGNOSIS — K8689 Other specified diseases of pancreas: Secondary | ICD-10-CM | POA: Diagnosis not present

## 2022-08-30 DIAGNOSIS — I700 Atherosclerosis of aorta: Secondary | ICD-10-CM | POA: Diagnosis not present

## 2022-08-30 LAB — CBC WITH DIFFERENTIAL/PLATELET
Abs Immature Granulocytes: 0.02 10*3/uL (ref 0.00–0.07)
Basophils Absolute: 0.1 10*3/uL (ref 0.0–0.1)
Basophils Relative: 1 %
Eosinophils Absolute: 0.1 10*3/uL (ref 0.0–0.5)
Eosinophils Relative: 2 %
HCT: 44.3 % (ref 36.0–46.0)
Hemoglobin: 15.4 g/dL — ABNORMAL HIGH (ref 12.0–15.0)
Immature Granulocytes: 0 %
Lymphocytes Relative: 27 %
Lymphs Abs: 1.7 10*3/uL (ref 0.7–4.0)
MCH: 39.3 pg — ABNORMAL HIGH (ref 26.0–34.0)
MCHC: 34.8 g/dL (ref 30.0–36.0)
MCV: 113 fL — ABNORMAL HIGH (ref 80.0–100.0)
Monocytes Absolute: 0.5 10*3/uL (ref 0.1–1.0)
Monocytes Relative: 8 %
Neutro Abs: 3.9 10*3/uL (ref 1.7–7.7)
Neutrophils Relative %: 62 %
Platelets: 151 10*3/uL (ref 150–400)
RBC: 3.92 MIL/uL (ref 3.87–5.11)
RDW: 14.8 % (ref 11.5–15.5)
WBC: 6.3 10*3/uL (ref 4.0–10.5)
nRBC: 0 % (ref 0.0–0.2)

## 2022-08-30 LAB — COMPREHENSIVE METABOLIC PANEL
ALT: 18 U/L (ref 0–44)
AST: 22 U/L (ref 15–41)
Albumin: 4.2 g/dL (ref 3.5–5.0)
Alkaline Phosphatase: 52 U/L (ref 38–126)
Anion gap: 10 (ref 5–15)
BUN: 13 mg/dL (ref 6–20)
CO2: 26 mmol/L (ref 22–32)
Calcium: 9.3 mg/dL (ref 8.9–10.3)
Chloride: 98 mmol/L (ref 98–111)
Creatinine, Ser: 0.79 mg/dL (ref 0.44–1.00)
GFR, Estimated: >60 mL/min (ref >60)
Glucose, Bld: 112 mg/dL — ABNORMAL HIGH (ref 70–99)
Potassium: 3.9 mmol/L (ref 3.5–5.1)
Sodium: 134 mmol/L — ABNORMAL LOW (ref 135–145)
Total Bilirubin: 0.8 mg/dL (ref 0.3–1.2)
Total Protein: 7.7 g/dL (ref 6.5–8.1)

## 2022-08-30 LAB — URINALYSIS, DIPSTICK ONLY
Bilirubin Urine: NEGATIVE
Glucose, UA: NEGATIVE mg/dL
Hgb urine dipstick: NEGATIVE
Ketones, ur: NEGATIVE mg/dL
Leukocytes,Ua: NEGATIVE
Nitrite: NEGATIVE
Protein, ur: NEGATIVE mg/dL
Specific Gravity, Urine: >1.046 — ABNORMAL HIGH (ref 1.005–1.030)
pH: 5 (ref 5.0–8.0)

## 2022-08-30 MED ORDER — ONDANSETRON HCL 4 MG/2ML IJ SOLN
4.0000 mg | Freq: Once | INTRAMUSCULAR | Status: AC
  Administered 2022-08-30: 4 mg via INTRAVENOUS
  Filled 2022-08-30: qty 2

## 2022-08-30 MED ORDER — IOHEXOL 300 MG/ML  SOLN
100.0000 mL | Freq: Once | INTRAMUSCULAR | Status: AC | PRN
Start: 2022-08-30 — End: 2022-08-30
  Administered 2022-08-30: 100 mL via INTRAVENOUS

## 2022-08-30 MED ORDER — HEPARIN SOD (PORK) LOCK FLUSH 100 UNIT/ML IV SOLN
500.0000 [IU] | Freq: Once | INTRAVENOUS | Status: AC | PRN
  Administered 2022-08-30: 500 [IU]
  Filled 2022-08-30: qty 5

## 2022-08-30 MED ORDER — SODIUM CHLORIDE 0.9 % IV SOLN
15.0000 mg/kg | Freq: Once | INTRAVENOUS | Status: AC
  Administered 2022-08-30: 1700 mg via INTRAVENOUS
  Filled 2022-08-30: qty 64

## 2022-08-30 MED ORDER — SODIUM CHLORIDE 0.9 % IV SOLN
Freq: Once | INTRAVENOUS | Status: AC
  Filled 2022-08-30: qty 250

## 2022-08-30 NOTE — Assessment & Plan Note (Addendum)
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 On avastin + lynparza-STABLE.  CT AP SEP 5th, 2023-  NED.  #  PROCEED WITH Avastin treatment today [see discussion below].  Labs reviewed.;  CBC- platelets >100; CMP are reviewed; adequate today.JUNE  2023-NEG.  Continue Lynparza 200 mg twice a day [severe anemia while on 250 mg twice a day].  Ca1 2 5 -normal limits however rising. Reviewed the CT scna SEP 2023-NEG for any recurrent cancer.   # Abdominal hernia-CT scan sep- ventral hernia noted; no obstruction.  October appt with surgeon at [facility] will have to hold avastin 4-6 weeks prior and after.   # Hypertension: Blood pressure 140/94  Monitor closely on Avastin.   Continue metoprolol/clonidine hot flashes. STABLE.   # PN G-2/back pain [awaiting steroid injection]- on Lyrica 50 mg TID-STABLE.  # Nausea- continue Zofran/ compazine prn  STABLE.   #IV access/Mediport-stable/port flush  # DISPOSITION: schedule 10-11 am appt #  avastin today- ok with out urine protein # in 3 weeks - MD;   Avastin; port/labs-  cbc/cmp; ca-125; urine random protein:creatiine ratio; Dr.B  # I reviewed the blood work- with the patient in detail; also reviewed the imaging independently [as summarized above]; and with the patient in detail.

## 2022-08-30 NOTE — Progress Notes (Signed)
Patient denies new problems/concerns today.  Had CT scan this morning.

## 2022-08-30 NOTE — Patient Instructions (Signed)
[facility] AT [place]  Discharge Instructions: Thank you for choosing [facility] to provide your oncology and hematology care.  If you have a lab appointment with the [facility], please go directly to the [facility] and check in at the registration area.  Wear comfortable clothing and clothing appropriate for easy access to any Portacath or PICC line.   We strive to give you quality time with your provider. You may need to reschedule your appointment if you arrive late (15 or more minutes).  Arriving late affects you and other patients whose appointments are after yours.  Also, if you miss three or more appointments without notifying the office, you may be dismissed from the clinic at the provider's discretion.      For prescription refill requests, have your pharmacy contact our office and allow 72 hours for refills to be completed.    Today you received the following chemotherapy and/or immunotherapy agents [facility]ASI      To help prevent nausea and vomiting after your treatment, we encourage you to take your nausea medication as directed.  BELOW ARE SYMPTOMS THAT SHOULD BE REPORTED IMMEDIATELY: *FEVER GREATER THAN 100.4 F (38 C) OR HIGHER *CHILLS OR SWEATING *NAUSEA AND VOMITING THAT IS NOT CONTROLLED WITH YOUR NAUSEA MEDICATION *UNUSUAL SHORTNESS OF BREATH *UNUSUAL BRUISING OR BLEEDING *URINARY PROBLEMS (pain or burning when urinating, or frequent urination) *BOWEL PROBLEMS (unusual diarrhea, constipation, pain near the anus) TENDERNESS IN MOUTH AND THROAT WITH OR WITHOUT PRESENCE OF ULCERS (sore throat, sores in mouth, or a toothache) UNUSUAL RASH, SWELLING OR PAIN  UNUSUAL VAGINAL DISCHARGE OR ITCHING   Items with * indicate a potential emergency and should be followed up as soon as possible or go to the Emergency Department if any problems should occur.  Please show the CHEMOTHERAPY ALERT CARD or IMMUNOTHERAPY ALERT CARD at check-in to the  Emergency Department and triage nurse.  Should you have questions after your visit or need to cancel or reschedule your appointment, please contact [facility] CANCER [place] AT [place]  [phone] and follow the prompts.  Office hours are 8:00 a.m. to 4:30 p.m. Monday - Friday. Please note that voicemails left after 4:00 p.m. may not be returned until the following business day.  We are closed weekends and major holidays. You have access to a nurse at all times for urgent questions. Please call the main number to the clinic [phone] and follow the prompts.  For any non-urgent questions, you may also contact your provider using MyChart. We now offer e-Visits for anyone 48 and older to request care online for non-urgent symptoms. For details visit [email or web address].   Also download the MyChart app! Go to the app store, search "MyChart", open the app, select [facility], and log in with your MyChart username and password.  Masks are optional in the cancer centers. If you would like for your care team to wear a mask while they are taking care of you, please let them know. For doctor visits, patients may have with them one support person who is at least 50 years old. At this time, visitors are not allowed in the infusion area.
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
## 2022-08-31 ENCOUNTER — Inpatient Hospital Stay (HOSPITAL_BASED_OUTPATIENT_CLINIC_OR_DEPARTMENT_OTHER): Payer: Medicare Other | Admitting: Hospice and Palliative Medicine

## 2022-08-31 DIAGNOSIS — Z515 Encounter for palliative care: Secondary | ICD-10-CM

## 2022-08-31 LAB — CA 125: Cancer Antigen (CA) 125: 18.5 U/mL (ref 0.0–38.1)

## 2022-08-31 NOTE — Progress Notes (Signed)
Voicemail left.  Will reschedule

## 2022-09-02 ENCOUNTER — Encounter: Payer: Self-pay | Admitting: Internal Medicine

## 2022-09-02 NOTE — Progress Notes (Deleted)
Name: [name]   MRN: [id number]    DOB: 1971/02/24   Date:09/02/2022       Progress Note  Subjective  Chief Complaint  Follow Up  HPI  HTN/Tachycardia: currently on metoprolol  25 mg and heart rate is normal now, bp is at goal ,no chest pain or palpitation    Morbid obesity: she lost weight during chemo, but  since January weight has been stable. 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 Alto Denver. 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 She takes Duloxetine and Lyrica and seems to be controlling symptoms of neuropathy, pain level is 4 right now, average pain is 5.5    Insomnia: she is now taking Lunesta 2 mg , given by oncologist and is doing well, able to fall and stay asleep. 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 Phq 9 went down from 11 to 8 with increase dose of Duloxetine ( dose adjusted May 2022 ), and gave her buspar. She states buspirone caused her to feel loopy .Advised to change time that she is taking Duloxetine from pm to am.   Ventral hernia: incidental finding on CT done 12/28/2020,  she had a ventral hernia repair May 2 nd and is doing well since   Dyslipidemia: last LDL was 131   The 10-year ASCVD risk score Mikey Bussing DC Brooke Bonito., et al., 2013)  is: 5.1%   Values used to calculate the score:     Age: 51 years     Sex: Female     Is Non-Hispanic African American: No     Diabetic: No     Tobacco smoker: Yes     Systolic Blood Pressure: 711 mmHg     Is BP treated: No     HDL Cholesterol: 46 mg/dL     Total Cholesterol: 219 mg/dL   Pre-diabetes: A1C was 6.2 % in January, she has polyphagia, polydipsia and polyuria   Elevated TSH: we will recheck level today. She denies change in bowel movements or increase in dry skin   Patient Active Problem List   Diagnosis Date Noted   Moderate episode of recurrent major depressive disorder ([place]) 06/17/2022   Smokers' cough ([place]) 05/17/2022   Dyslipidemia 05/17/2022   Elevated TSH 05/17/2022   Pre-diabetes 05/17/2022   Ventral hernia without obstruction or gangrene 04/26/2021   Malignant ascites 12/22/2019   Pleural effusion on right 12/22/2019   Tachycardia 12/22/2019   Primary peritoneal carcinomatosis ([place]) 12/16/2019   Goals of care, counseling/discussion 12/16/2019   Erythrocytosis 11/12/2019   Morbid obesity ([place]) 07/07/2018   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   Fever blister 05/17/2018   Family history of breast cancer 05/08/2018  Family history of colon cancer in mother 05/08/2018   Migraine with aura and without status migrainosus 04/18/2018   Mild recurrent major depression (HCC) 01/20/2016   Esophageal reflux 01/20/2016   Osteoarthritis, multiple sites 01/20/2016    Past Surgical History:  Procedure Laterality Date   ABDOMINAL HYSTERECTOMY  03/2020   APPENDECTOMY     LSC but "ruptured when they did the surgery"   BREAST BIOPSY Left 01/04/2021   MRI BX   CESAREAN SECTION     CYSTOSCOPY N/A 04/08/2020   Procedure: CYSTOSCOPY;  Surgeon: Artelia Laroche, MD;  [name];  Service: Gynecology;  [name];   INSERTION OF MESH N/A 04/26/2021   Procedure: INSERTION OF MESH;  Surgeon: Earline Mayotte, MD;  [name];  Service: General;  Laterality:  N/A;   IR THORACENTESIS ASP PLEURAL SPACE W/IMG GUIDE  12/06/2019   IUD REMOVAL N/A 04/08/2020   Procedure: INTRAUTERINE DEVICE (IUD) REMOVAL;  Surgeon: Artelia Laroche, MD;  [name];  Service: Gynecology;  [name];   PARACENTESIS     x6   PORTA CATH INSERTION N/A 04/23/2020   Procedure: PORTA CATH INSERTION;  Surgeon: Annice Needy, MD;  Location: [facility];  Service: Cardiovascular;  [name];   TUBAL LIGATION     at time of CSxn   VENTRAL HERNIA REPAIR N/A 04/26/2021   Procedure: HERNIA REPAIR VENTRAL ADULT;  Surgeon: Earline Mayotte, MD;  [name];  Service: General;  [name];  need RNFA for the case   WRIST SURGERY Left 11/21/2016   plates and screws inserted    Family History  Adopted: Yes  Problem Relation Age of Onset   Lung cancer Father        deceased 26   Breast cancer Mother 66       currently 33   Colon cancer Mother    ADD / ADHD Son    ADD / ADHD Son    Early death Maternal Aunt    Breast cancer Maternal Aunt 34       deceased 42   Breast cancer Maternal Grandmother    Depression Daughter    Depression Daughter    Prostate cancer Paternal Uncle    Stroke Paternal Uncle    Leukemia Paternal Aunt    Breast cancer Paternal Grandmother    Cancer Maternal Uncle     Social History   Tobacco Use   Smoking status: Every Day    Packs/day: 1.00    Years: 30.00    Total pack years: 30.00    Types: Cigarettes   Smokeless tobacco: Never  Substance Use Topics   Alcohol use: Not Currently    Alcohol/week: 0.0 standard drinks of alcohol     Current Outpatient Medications:    budesonide-formoterol (SYMBICORT) 160-4.5 MCG/ACT inhaler, Inhale 2 puffs into the lungs 2 (two) times daily., Disp: 1 each, Rfl: 3   buPROPion (WELLBUTRIN XL) 300 MG 24 hr tablet, Take 1 tablet (300 mg total) by mouth daily., Disp: 90 tablet, Rfl: 0   cloNIDine (CATAPRES - DOSED IN MG/24 HR) 0.3 mg/24hr patch, Place 1 patch (0.3  mg total) onto the skin once a week., Disp: 12 patch, Rfl: 3   cloNIDine (CATAPRES) 0.1 MG tablet, Take 1 tablet (0.1 mg total) by mouth at bedtime., Disp: 90 tablet, Rfl: 3   clotrimazole (LOTRIMIN) 1 % cream, Apply 1 application. topically 2 (two) times daily., Disp: 30 g, Rfl: 0  DULoxetine (CYMBALTA) 60 MG capsule, Take 1 capsule (60 mg total) by mouth daily., Disp: 90 capsule, Rfl: 1   eszopiclone (LUNESTA) 2 MG TABS tablet, Take 1 tablet (2 mg total) by mouth at bedtime as needed for sleep. Take immediately before bedtime, Disp: 30 tablet, Rfl: 2   loratadine (CLARITIN) 10 MG tablet, TAKE 1 TABLET(10 MG) BY MOUTH EVERY MORNING, Disp: 90 tablet, Rfl: 3   LORazepam (ATIVAN) 0.5 MG tablet, Take 1 tablet (0.5 mg total) by mouth every 8 (eight) hours as needed for anxiety., Disp: 60 tablet, Rfl: 0   MAGNESIUM CITRATE PO, 2 tablets daily. (Patient not taking: Reported on 08/30/2022), Disp: , Rfl:    metoprolol succinate (TOPROL-XL) 25 MG 24 hr tablet, TAKE 1/2 TABLET(12.5 MG) BY MOUTH DAILY, Disp: 45 tablet, Rfl: 1   Multiple Vitamin (MULTIVITAMIN WITH MINERALS) TABS tablet, Take 1 tablet by mouth daily., Disp: , Rfl:    nystatin (MYCOSTATIN/NYSTOP) powder, Apply 1 application. topically 3 (three) times daily., Disp: 15 g, Rfl: 0   olaparib (LYNPARZA) 100 MG tablet, Take 2 tablets (200 mg total) by mouth 2 (two) times daily. May take with food to decrease nausea and vomiting., Disp: 120 tablet, Rfl: 3   omeprazole (PRILOSEC) 20 MG capsule, TAKE 1 CAPSULE(20 MG) BY MOUTH DAILY, Disp: 90 capsule, Rfl: 2   ondansetron (ZOFRAN-ODT) 8 MG disintegrating tablet, Take 1 tablet (8 mg total) by mouth every 8 (eight) hours as needed for nausea or vomiting., Disp: 30 tablet, Rfl: 3   oxyCODONE (OXY IR/ROXICODONE) 5 MG immediate release tablet, Take 0.5-1 tablets (2.5-5 mg total) by mouth every 6 (six) hours as needed for severe pain., Disp: 60 tablet, Rfl: 0   polyethylene glycol powder (GLYCOLAX/MIRALAX) 17  GM/SCOOP powder, Take 0.5 Containers by mouth daily as needed for mild constipation or moderate constipation., Disp: , Rfl:    pregabalin (LYRICA) 150 MG capsule, Take 150 mg by mouth 3 (three) times daily., Disp: , Rfl:    prochlorperazine (COMPAZINE) 10 MG tablet, Take 1 tablet (10 mg total) by mouth every 6 (six) hours as needed for nausea or vomiting., Disp: 40 tablet, Rfl: 3   Semaglutide,0.25 or 0.5MG /DOS, 2 MG/1.5ML SOPN, Inject 0.25 mg into the skin once a week., Disp: 1.5 mL, Rfl: 0   Semaglutide,0.25 or 0.5MG /DOS, 2 MG/3ML SOPN, Inject 0.25 mg into the skin once a week., Disp: 1.5 mL, Rfl: 0   Semaglutide-Weight Management 0.25 MG/0.5ML SOAJ, Inject 0.25 mg into the skin once a week for 28 days., Disp: 2 mL, Rfl: 0   [START ON 09/03/2022] Semaglutide-Weight Management 0.5 MG/0.5ML SOAJ, Inject 0.5 mg into the skin once a week for 28 days., Disp: 2 mL, Rfl: 0   [START ON 10/02/2022] Semaglutide-Weight Management 1 MG/0.5ML SOAJ, Inject 1 mg into the skin once a week for 28 days., Disp: 2 mL, Rfl: 0   [START ON 10/31/2022] Semaglutide-Weight Management 1.7 MG/0.75ML SOAJ, Inject 1.7 mg into the skin once a week for 28 days., Disp: 3 mL, Rfl: 0   [START ON 11/29/2022] Semaglutide-Weight Management 2.4 MG/0.75ML SOAJ, Inject 2.4 mg into the skin once a week for 28 days., Disp: 3 mL, Rfl: 0   senna (SENOKOT) 8.6 MG tablet, Take 1 tablet by mouth daily as needed for constipation., Disp: , Rfl:    SUMAtriptan (IMITREX) 100 MG tablet, Take 1 tablet (100 mg total) by mouth every 2 (two) hours as needed for migraine. May repeat in 2 hours if headache persists or recurs., Disp: 10  tablet, Rfl: 2   valACYclovir (VALTREX) 1000 MG tablet, Take 1 tablet (1,000 mg total) by mouth 2 (two) times daily as needed., Disp: 6 tablet, Rfl: 2 No current facility-administered medications for this visit.  Facility-Administered Medications Ordered in Other Visits:    heparin lock flush 100 unit/mL, 250 Units,  Intracatheter, Once PRN, Cammie Sickle, MD   ondansetron [facility]) 4 MG/2ML injection, , , ,    sodium chloride flush (NS) 0.9 % injection 10 mL, 10 mL, Intravenous, Once, Rogue Bussing, Elisha Headland, MD  No Known Allergies  I personally reviewed active problem list, medication list, allergies, family history, social history, health maintenance with the patient/caregiver today.   ROS  ***  Objective  There were no vitals filed for this visit.  There is no height or weight on file to calculate BMI.  Physical Exam ***   PHQ2/9:    08/05/2022    9:18 AM 06/17/2022    2:31 PM 05/17/2022   11:24 AM 04/13/2022    3:19 PM 08/31/2021    1:43 PM  Depression screen PHQ 2/9  Decreased Interest 2 2 2 1 1   Down, Depressed, Hopeless 2 2 2 1 1   PHQ - 2 Score 4 4 4 2 2   Altered sleeping 2 2 3 1  0  Tired, decreased energy 2 2 3 1 3   Change in appetite 0 2 1 0 2  Feeling bad or failure about yourself  0 1 2 1 1   Trouble concentrating 0 1 2 1  0  Moving slowly or fidgety/restless 0 0 0 0 0  Suicidal thoughts 0 0 0 0 0  PHQ-9 Score 8 12 15 6 8   Difficult doing work/chores Somewhat difficult Somewhat difficult  Somewhat difficult     phq 9 is {gen pos [id number]}   Fall Risk:    08/05/2022    9:18 AM 06/17/2022    2:31 PM 05/17/2022   11:24 AM 08/31/2021    1:43 PM 01/14/2021    2:00 PM  [place] in the past year? 0 0 1 0 0  Number falls in past yr: 0 0 1 0   Injury with Fall? 0 0 0 0   Risk for fall due to : No Fall Risks No Fall Risks No Fall Risks No Fall Risks   Follow up Falls prevention discussed;Education provided Falls prevention discussed;Education provided Falls prevention discussed Falls prevention discussed       Functional Status Survey:      Assessment & Plan  *** There are no diagnoses linked to this encounter.

## 2022-09-05 ENCOUNTER — Ambulatory Visit: Payer: Medicare Other | Admitting: Family Medicine

## 2022-09-14 ENCOUNTER — Encounter: Payer: Self-pay | Admitting: Internal Medicine

## 2022-09-15 ENCOUNTER — Other Ambulatory Visit: Payer: Self-pay | Admitting: Family Medicine

## 2022-09-15 ENCOUNTER — Other Ambulatory Visit: Payer: Self-pay | Admitting: Hospice and Palliative Medicine

## 2022-09-15 ENCOUNTER — Telehealth: Payer: Self-pay | Admitting: Family Medicine

## 2022-09-15 DIAGNOSIS — F331 Major depressive disorder, recurrent, moderate: Secondary | ICD-10-CM

## 2022-09-15 DIAGNOSIS — M5136 Other intervertebral disc degeneration, lumbar region: Secondary | ICD-10-CM

## 2022-09-15 NOTE — Telephone Encounter (Addendum)
Patient will run out of DULoxetine (CYMBALTA) 60 MG capsule in 2-3 days and would like PCP to refill.   Patient states she was last seen by PA on 08/05/2022 and all her medications should have been filled. Patient states you can not just stop taking DULoxetine (CYMBALTA) 60 MG capsule.   Patient no showed last OV with PCP because she had chemo. Patient [place] OV to 09/28/2022.   [id number] - [place], [address] [place], [place] [address]  Phone:  [phone]  Fax:  [phone]

## 2022-09-16 ENCOUNTER — Other Ambulatory Visit: Payer: Self-pay

## 2022-09-16 ENCOUNTER — Other Ambulatory Visit: Payer: Self-pay | Admitting: Family Medicine

## 2022-09-16 ENCOUNTER — Encounter: Payer: Self-pay | Admitting: Internal Medicine

## 2022-09-16 ENCOUNTER — Telehealth: Payer: Self-pay

## 2022-09-16 DIAGNOSIS — M5136 Other intervertebral disc degeneration, lumbar region: Secondary | ICD-10-CM

## 2022-09-16 DIAGNOSIS — F331 Major depressive disorder, recurrent, moderate: Secondary | ICD-10-CM

## 2022-09-16 MED ORDER — DULOXETINE HCL 60 MG PO CPEP: 60.0000 mg | ORAL_CAPSULE | Freq: Every day | ORAL | 0 refills | Status: DC

## 2022-09-16 MED ORDER — OXYCODONE HCL 5 MG PO TABS
2.5000 mg | ORAL_TABLET | Freq: Four times a day (QID) | ORAL | 0 refills | Status: DC | PRN
Start: 2022-09-16 — End: 2022-11-03

## 2022-09-16 NOTE — Telephone Encounter (Signed)
completed

## 2022-09-20 ENCOUNTER — Inpatient Hospital Stay: Payer: Medicare Other

## 2022-09-20 ENCOUNTER — Encounter: Payer: Self-pay | Admitting: Internal Medicine

## 2022-09-20 ENCOUNTER — Inpatient Hospital Stay (HOSPITAL_BASED_OUTPATIENT_CLINIC_OR_DEPARTMENT_OTHER): Payer: Medicare Other | Admitting: Internal Medicine

## 2022-09-20 DIAGNOSIS — Z452 Encounter for adjustment and management of vascular access device: Secondary | ICD-10-CM | POA: Diagnosis not present

## 2022-09-20 DIAGNOSIS — Z7189 Other specified counseling: Secondary | ICD-10-CM | POA: Diagnosis not present

## 2022-09-20 DIAGNOSIS — M5416 Radiculopathy, lumbar region: Secondary | ICD-10-CM | POA: Diagnosis not present

## 2022-09-20 DIAGNOSIS — C482 Malignant neoplasm of peritoneum, unspecified: Secondary | ICD-10-CM

## 2022-09-20 DIAGNOSIS — F17210 Nicotine dependence, cigarettes, uncomplicated: Secondary | ICD-10-CM | POA: Diagnosis not present

## 2022-09-20 DIAGNOSIS — Z79899 Other long term (current) drug therapy: Secondary | ICD-10-CM | POA: Diagnosis not present

## 2022-09-20 DIAGNOSIS — M47816 Spondylosis without myelopathy or radiculopathy, lumbar region: Secondary | ICD-10-CM | POA: Diagnosis not present

## 2022-09-20 DIAGNOSIS — Z5112 Encounter for antineoplastic immunotherapy: Secondary | ICD-10-CM | POA: Diagnosis not present

## 2022-09-20 DIAGNOSIS — M5459 Other low back pain: Secondary | ICD-10-CM | POA: Diagnosis not present

## 2022-09-20 LAB — COMPREHENSIVE METABOLIC PANEL
ALT: 15 U/L (ref 0–44)
AST: 21 U/L (ref 15–41)
Albumin: 3.9 g/dL (ref 3.5–5.0)
Alkaline Phosphatase: 51 U/L (ref 38–126)
Anion gap: 5 (ref 5–15)
BUN: 9 mg/dL (ref 6–20)
CO2: 27 mmol/L (ref 22–32)
Calcium: 9.1 mg/dL (ref 8.9–10.3)
Chloride: 106 mmol/L (ref 98–111)
Creatinine, Ser: 0.8 mg/dL (ref 0.44–1.00)
GFR, Estimated: >60 mL/min (ref >60)
Glucose, Bld: 96 mg/dL (ref 70–99)
Potassium: 3.7 mmol/L (ref 3.5–5.1)
Sodium: 138 mmol/L (ref 135–145)
Total Bilirubin: 0.6 mg/dL (ref 0.3–1.2)
Total Protein: 7 g/dL (ref 6.5–8.1)

## 2022-09-20 LAB — CBC WITH DIFFERENTIAL/PLATELET
Abs Immature Granulocytes: 0.02 10*3/uL (ref 0.00–0.07)
Basophils Absolute: 0 10*3/uL (ref 0.0–0.1)
Basophils Relative: 1 %
Eosinophils Absolute: 0.1 10*3/uL (ref 0.0–0.5)
Eosinophils Relative: 1 %
HCT: 44.3 % (ref 36.0–46.0)
Hemoglobin: 15 g/dL (ref 12.0–15.0)
Immature Granulocytes: 1 %
Lymphocytes Relative: 32 %
Lymphs Abs: 1.4 10*3/uL (ref 0.7–4.0)
MCH: 38.5 pg — ABNORMAL HIGH (ref 26.0–34.0)
MCHC: 33.9 g/dL (ref 30.0–36.0)
MCV: 113.6 fL — ABNORMAL HIGH (ref 80.0–100.0)
Monocytes Absolute: 0.4 10*3/uL (ref 0.1–1.0)
Monocytes Relative: 9 %
Neutro Abs: 2.4 10*3/uL (ref 1.7–7.7)
Neutrophils Relative %: 56 %
Platelets: 116 10*3/uL — ABNORMAL LOW (ref 150–400)
RBC: 3.9 MIL/uL (ref 3.87–5.11)
RDW: 15.2 % (ref 11.5–15.5)
WBC: 4.3 10*3/uL (ref 4.0–10.5)
nRBC: 0 % (ref 0.0–0.2)

## 2022-09-20 LAB — URINALYSIS, DIPSTICK ONLY
Bilirubin Urine: NEGATIVE
Glucose, UA: NEGATIVE mg/dL
Hgb urine dipstick: NEGATIVE
Ketones, ur: NEGATIVE mg/dL
Leukocytes,Ua: NEGATIVE
Nitrite: NEGATIVE
Protein, ur: 30 mg/dL — AB
Specific Gravity, Urine: 1.025 (ref 1.005–1.030)
pH: 5 (ref 5.0–8.0)

## 2022-09-20 MED ORDER — SODIUM CHLORIDE 0.9 % IV SOLN
Freq: Once | INTRAVENOUS | Status: AC
  Filled 2022-09-20: qty 250

## 2022-09-20 MED ORDER — HEPARIN SOD (PORK) LOCK FLUSH 100 UNIT/ML IV SOLN
500.0000 [IU] | Freq: Once | INTRAVENOUS | Status: AC | PRN
  Administered 2022-09-20: 500 [IU]
  Filled 2022-09-20: qty 5

## 2022-09-20 MED ORDER — SODIUM CHLORIDE 0.9 % IV SOLN
15.0000 mg/kg | Freq: Once | INTRAVENOUS | Status: AC
  Administered 2022-09-20: 1700 mg via INTRAVENOUS
  Filled 2022-09-20: qty 64

## 2022-09-20 MED ORDER — ALTEPLASE 2 MG IJ SOLR
2.0000 mg | Freq: Once | INTRAMUSCULAR | Status: AC | PRN
  Administered 2022-09-20: 2 mg
  Filled 2022-09-20: qty 2

## 2022-09-20 MED ORDER — ONDANSETRON HCL 4 MG/2ML IJ SOLN
4.0000 mg | Freq: Once | INTRAMUSCULAR | Status: AC
  Administered 2022-09-20: 4 mg via INTRAVENOUS
  Filled 2022-09-20: qty 2

## 2022-09-20 MED ORDER — SODIUM CHLORIDE 0.9% FLUSH
10.0000 mL | INTRAVENOUS | Status: DC | PRN
  Filled 2022-09-20: qty 10

## 2022-09-20 NOTE — Assessment & Plan Note (Addendum)
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 On avastin + lynparza-STABLE.  CT AP SEP 5th, 2023-  NED.STABLE.  #  PROCEED WITH Avastin treatment today [see discussion below].  Labs reviewed.;  CBC- platelets >100; CMP are reviewed; adequate today.JUNE  2023-NEG.  Continue Lynparza 200 mg twice a day [severe anemia while on 250 mg twice a day].  Ca1 2 5 -normal limits BUT RISING- AUG, 223- 18. Reviewed the CT scna SEP 2023-NEG for any recurrent cancer. STABLE.  # Abdominal hernia-CT scan sep- ventral hernia noted; no obstruction.  October appt with surgeon at [facility]. undesand will have to hold avastin 4-6 weeks prior and after.   # Hypertension: Blood pressure 112/80s-   Monitor closely on Avastin.   Continue metoprolol/clonidine hot flashes. STABLE.  # PN G-2/back pain [awaiting steroid injection]- on Lyrica 50 mg TID-STABLE.  # Nausea-sec to Lynparza; worsened by semaglutide.  Continue Zofran/ compazine prn monitor for now.   #IV access/Mediport-currently s/p TPA- port flush  # DISPOSITION: schedule 10-11 am appt #  avastin today- ok with out urine protein # in 3 weeks - MD;   Avastin; port/labs-  cbc/cmp; ca-125; urine random protein:creatiine ratio; Dr.B   

## 2022-09-20 NOTE — Progress Notes (Signed)
Pt in for follow up, reports some mild nausea.  Reports she is trying to lose weight so she can have hernia repair.  Pt is currently taking semaglutide to help with weight management.
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
## 2022-09-20 NOTE — Patient Instructions (Signed)
[facility] AT [place]  Discharge Instructions: Thank you for choosing [facility] to provide your oncology and hematology care.  If you have a lab appointment with the [facility], please go directly to the [facility] and check in at the registration area.  Wear comfortable clothing and clothing appropriate for easy access to any Portacath or PICC line.   We strive to give you quality time with your provider. You may need to reschedule your appointment if you arrive late (15 or more minutes).  Arriving late affects you and other patients whose appointments are after yours.  Also, if you miss three or more appointments without notifying the office, you may be dismissed from the clinic at the provider's discretion.      For prescription refill requests, have your pharmacy contact our office and allow 72 hours for refills to be completed.    Today you received the following chemotherapy and/or immunotherapy agents Avastin.      To help prevent nausea and vomiting after your treatment, we encourage you to take your nausea medication as directed.  BELOW ARE SYMPTOMS THAT SHOULD BE REPORTED IMMEDIATELY: *FEVER GREATER THAN 100.4 F (38 C) OR HIGHER *CHILLS OR SWEATING *NAUSEA AND VOMITING THAT IS NOT CONTROLLED WITH YOUR NAUSEA MEDICATION *UNUSUAL SHORTNESS OF BREATH *UNUSUAL BRUISING OR BLEEDING *URINARY PROBLEMS (pain or burning when urinating, or frequent urination) *BOWEL PROBLEMS (unusual diarrhea, constipation, pain near the anus) TENDERNESS IN MOUTH AND THROAT WITH OR WITHOUT PRESENCE OF ULCERS (sore throat, sores in mouth, or a toothache) UNUSUAL RASH, SWELLING OR PAIN  UNUSUAL VAGINAL DISCHARGE OR ITCHING   Items with * indicate a potential emergency and should be followed up as soon as possible or go to the Emergency Department if any problems should occur.  Please show the CHEMOTHERAPY ALERT CARD or IMMUNOTHERAPY ALERT CARD at check-in to  the Emergency Department and triage nurse.  Should you have questions after your visit or need to cancel or reschedule your appointment, please contact [facility] [place] AT [place]  [phone] and follow the prompts.  Office hours are 8:00 a.m. to 4:30 p.m. Monday - Friday. Please note that voicemails left after 4:00 p.m. may not be returned until the following business day.  We are closed weekends and major holidays. You have access to a nurse at all times for urgent questions. Please call the main number to the clinic [phone] and follow the prompts.  For any non-urgent questions, you may also contact your provider using MyChart. We now offer e-Visits for anyone 102 and older to request care online for non-urgent symptoms. For details visit [email or web address].   Also download the MyChart app! Go to the app store, search "MyChart", open the app, select [facility], and log in with your MyChart username and password.  Masks are optional in the cancer centers. If you would like for your care team to wear a mask while they are taking care of you, please let them know. For doctor visits, patients may have with them one support person who is at least 50 years old. At this time, visitors are not allowed in the infusion area.

## 2022-09-21 ENCOUNTER — Encounter: Payer: Self-pay | Admitting: Internal Medicine

## 2022-09-21 LAB — PROTEIN ELECTRO, RANDOM URINE
Albumin ELP, Urine: 52.5 %
Alpha-1-Globulin, U: 3.1 %
Alpha-2-Globulin, U: 12.4 %
Beta Globulin, U: 20.7 %
Gamma Globulin, U: 11.2 %
Total Protein, Urine: 28.8 mg/dL

## 2022-09-21 NOTE — Progress Notes (Signed)
Error

## 2022-09-22 LAB — CA 125: Cancer Antigen (CA) 125: 18.2 U/mL (ref 0.0–38.1)

## 2022-09-25 ENCOUNTER — Encounter: Payer: Self-pay | Admitting: Internal Medicine

## 2022-09-26 ENCOUNTER — Ambulatory Visit (INDEPENDENT_AMBULATORY_CARE_PROVIDER_SITE_OTHER): Payer: Medicare Other | Admitting: Podiatry

## 2022-09-26 DIAGNOSIS — D492 Neoplasm of unspecified behavior of bone, soft tissue, and skin: Secondary | ICD-10-CM

## 2022-09-26 DIAGNOSIS — D485 Neoplasm of uncertain behavior of skin: Secondary | ICD-10-CM

## 2022-09-26 DIAGNOSIS — M79671 Pain in right foot: Secondary | ICD-10-CM | POA: Diagnosis not present

## 2022-09-26 NOTE — Progress Notes (Signed)
Subjective:   Patient ID: [name], female   DOB: 51 y.o.   MRN: [id number]   HPI Chief Complaint  Patient presents with   Plantar Warts    Patient came in today for a right foot plantar wart which started year a ago, she started having pain 6 mths ago,rate of pain 6 out of 10 while walking,  patient has neuropathy in feet,     51 y.o. female with the above concerns.  He thinks it is a corn. It has been onggoing for about a year but worsing pain over the last 6 months. No injuries that she reports.  No recent treatment.  She has stage 4 ovarian cancer causing neuropathy  Review of Systems  All other systems reviewed and are negative.  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   Depression    Drug-induced androgenic alopecia    Dysrhythmia    Family history of breast cancer    GERD (gastroesophageal reflux disease)    Hypertension    Insomnia    Menorrhagia    Migraines    Osteoarthritis    back   Ovarian cancer ([place]) 12/10/2019   Personal history of chemotherapy    ovarian cancer   Plantar fasciitis     Past Surgical History:  Procedure Laterality Date   ABDOMINAL HYSTERECTOMY  03/2020   APPENDECTOMY     LSC but "ruptured when they did the surgery"   BREAST BIOPSY Left 01/04/2021   MRI BX   CESAREAN SECTION     CYSTOSCOPY N/A 04/08/2020   Procedure: CYSTOSCOPY;  [name], MD;  [name];  Service: Gynecology;  [name];   INSERTION OF MESH N/A 04/26/2021   Procedure: INSERTION OF MESH;  [name], MD;  [name];  Service: General;  [name];   IR THORACENTESIS ASP PLEURAL SPACE W/IMG GUIDE  12/06/2019   IUD REMOVAL N/A 04/08/2020   Procedure: INTRAUTERINE DEVICE  (IUD) REMOVAL;  [name], MD;  [name];  Service: Gynecology;  [name];   PARACENTESIS     x6   PORTA CATH INSERTION N/A 04/23/2020   Procedure: PORTA CATH INSERTION;  [name], MD;  Location: [place];  Service: Cardiovascular;  [name];   TUBAL LIGATION     at time of CSxn   VENTRAL HERNIA REPAIR N/A 04/26/2021   Procedure: HERNIA REPAIR VENTRAL ADULT;  [name], MD;  [name];  Service: General;  [name];  need RNFA for the case   WRIST SURGERY Left 11/21/2016   plates and screws inserted     Current Outpatient Medications:    ARIPiprazole (ABILIFY) 2 MG tablet, Take 1 tablet (2 mg total) by mouth daily., Disp: 90 tablet, Rfl: 0   budesonide-formoterol (SYMBICORT) 160-4.5 MCG/ACT inhaler, Inhale 2 puffs into the lungs 2 (two) times daily., Disp: 1 each, Rfl: 3   buPROPion (WELLBUTRIN XL) 300 MG 24 hr tablet, Take 1 tablet (300 mg total) by mouth daily., Disp: 90 tablet, Rfl: 0   cloNIDine (CATAPRES - DOSED IN MG/24 HR) 0.3 mg/24hr patch, Place 1 patch (0.3 mg total) onto the skin once a week.,  Disp: 12 patch, Rfl: 3   cloNIDine (CATAPRES) 0.1 MG tablet, Take 1 tablet (0.1 mg total) by mouth at bedtime., Disp: 90 tablet, Rfl: 3   clotrimazole (LOTRIMIN) 1 % cream, Apply 1 application. topically 2 (two) times daily., Disp: 30 g, Rfl: 0   DULoxetine (CYMBALTA) 60 MG capsule, Take 1 capsule (60 mg total) by mouth daily., Disp: 90 capsule, Rfl: 0   eszopiclone (LUNESTA) 2 MG TABS tablet, Take 1 tablet (2 mg total) by mouth at bedtime as needed for sleep. Take immediately before bedtime, Disp: 30 tablet, Rfl: 2   loratadine (CLARITIN) 10 MG tablet, TAKE 1 TABLET(10 MG) BY MOUTH EVERY MORNING, Disp: 90 tablet, Rfl: 3   LORazepam (ATIVAN) 0.5 MG tablet, Take 1 tablet (0.5 mg total) by mouth every 8 (eight) hours as needed for anxiety., Disp: 60 tablet, Rfl: 0   metoprolol succinate  (TOPROL-XL) 25 MG 24 hr tablet, TAKE 1/2 TABLET(12.5 MG) BY MOUTH DAILY, Disp: 45 tablet, Rfl: 1   Multiple Vitamin (MULTIVITAMIN WITH MINERALS) TABS tablet, Take 1 tablet by mouth daily., Disp: , Rfl:    nystatin (MYCOSTATIN/NYSTOP) powder, Apply 1 Application topically 3 (three) times daily., Disp: 60 g, Rfl: 0   olaparib (LYNPARZA) 100 MG tablet, Take 2 tablets (200 mg total) by mouth 2 (two) times daily. May take with food to decrease nausea and vomiting., Disp: 120 tablet, Rfl: 3   omeprazole (PRILOSEC) 20 MG capsule, TAKE 1 CAPSULE(20 MG) BY MOUTH DAILY, Disp: 90 capsule, Rfl: 2   ondansetron (ZOFRAN-ODT) 8 MG disintegrating tablet, Take 1 tablet (8 mg total) by mouth every 8 (eight) hours as needed for nausea or vomiting., Disp: 30 tablet, Rfl: 3   oxyCODONE (OXY IR/ROXICODONE) 5 MG immediate release tablet, Take 0.5-1 tablets (2.5-5 mg total) by mouth every 6 (six) hours as needed for severe pain., Disp: 60 tablet, Rfl: 0   polyethylene glycol powder (GLYCOLAX/MIRALAX) 17 GM/SCOOP powder, Take 0.5 Containers by mouth daily as needed for mild constipation or moderate constipation., Disp: , Rfl:    pregabalin (LYRICA) 150 MG capsule, Take 150 mg by mouth 3 (three) times daily., Disp: , Rfl:    prochlorperazine (COMPAZINE) 10 MG tablet, Take 1 tablet (10 mg total) by mouth every 6 (six) hours as needed for nausea or vomiting., Disp: 40 tablet, Rfl: 3   Semaglutide,0.25 or 0.5MG/DOS, 2 MG/3ML SOPN, Inject 0.25 mg into the skin once a week., Disp: 1.5 mL, Rfl: 0   senna (SENOKOT) 8.6 MG tablet, Take 1 tablet by mouth daily as needed for constipation., Disp: , Rfl:    SUMAtriptan (IMITREX) 100 MG tablet, Take 1 tablet (100 mg total) by mouth every 2 (two) hours as needed for migraine. May repeat in 2 hours if headache persists or recurs., Disp: 10 tablet, Rfl: 2   valACYclovir (VALTREX) 1000 MG tablet, Take 1 tablet (1,000 mg total) by mouth 2 (two) times daily as needed., Disp: 6 tablet, Rfl: 2 No  current facility-administered medications for this visit.  Facility-Administered Medications Ordered in Other Visits:    heparin lock flush 100 unit/mL, 250 Units, Intracatheter, Once PRN, Cammie Sickle, MD   ondansetron (ZOFRAN) 4 MG/2ML injection, , , ,    sodium chloride flush (NS) 0.9 % injection 10 mL, 10 mL, Intravenous, Once, Brahmanday, Govinda R, MD   sodium chloride flush (NS) 0.9 % injection 10 mL, 10 mL, Intracatheter, PRN, Cammie Sickle, MD  No Known Allergies       Objective:  Physical  Exam  General: AAO x3, NAD  Dermatological: On the plantar aspect of the right midfoot there is a hyperkeratotic lesion.  Upon debridement borders.  More porokeratosis.  No capillary budding or evidence of verruca today.  No puncture wound.  No edema, erythema or signs of infection.  Vascular: Dorsalis Pedis artery and Posterior Tibial artery pedal pulses are 2/4 bilateral with immedate capillary fill time. There is no pain with calf compression, swelling, warmth, erythema.   Neruologic: Sensation mildly decreased with Semmes Weinstein monofilament  Musculoskeletal: Tenderness with skin lesion but no other areas of discomfort.  Muscular strength 5/5 in all groups tested bilateral.  Gait: Unassisted, Nonantalgic.       Assessment:   Skin lesion right foot     Plan:  -Treatment options discussed including all alternatives, risks, and complications -Etiology of symptoms were discussed -Sharply debrided lesion to any complications or bleeding.  I cleaned the skin with alcohol pad was placed followed by salicylic acid and a bandage.  Postprocedure instructions discussed.  Monitor any signs or symptoms of infection.  Trula Slade DPM

## 2022-09-26 NOTE — Patient Instructions (Signed)
Keep the bandage on for 24 hours. At that time, remove and clean with soap and water. If it hurts or burns before 24 hours go ahead and remove the bandage and wash with soap and water. Keep the area clean. If there is any blistering cover with antibiotic ointment and a bandage. Monitor for any redness, drainage, or other signs of infection. Call the office if any are to occur. If you have any questions, please call the office at [phone].  

## 2022-09-27 ENCOUNTER — Telehealth: Payer: Self-pay | Admitting: *Deleted

## 2022-09-27 NOTE — Patient Outreach (Signed)
  Care Coordination   09/27/2022 Name: [name] MRN: [id number] DOB: Jan 18, 1971   Care Coordination Outreach Attempts:  A second unsuccessful outreach was attempted today to offer the patient with information about available care coordination services as a benefit of their health plan.     Follow Up Plan:  Additional outreach attempts will be made to offer the patient care coordination information and services.   Encounter Outcome:  No Answer  Care Coordination Interventions Activated:  No   Care Coordination Interventions:  No, not indicated     Jayant Kriz, [place] Worker  [facility] Care Management [phone]

## 2022-09-27 NOTE — Progress Notes (Unsigned)
Name: [name]   MRN: [id number]    DOB: 01/26/1971   Date:09/28/2022       Progress Note  Subjective  Chief Complaint  Follow Up  HPI  HTN/Tachycardia: currently on metoprolol  25 mg and heart rate is normal now, bp is at goal ,no chest pain or palpitation Unchange  Chronic back pain: still under the care of ortho and takes Lyrica and symptoms are under control now    Morbid obesity:she was diagnosed with carcinomatosis  in 11/2019 at a weight of 259 lbs. It went up to 271 lbs 08/2021, she was seen by [facility] August 2023 weight was 242 lbs. She has been eating healthier and started on Ozempic, still taking 0.25 mg weekly and has lost 9 lbs , today's weight is 234 lbs. She is happy with results, it curbs her appetite, we will increase dose to 0.5 mg and consider going up to 9m if tolerated.    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 SAlto [place] She is having some spotting, night sweats are severe, on Clonidine 0.3 mg patch and  clonidine 0.1 mg qhs , she states hot flashes has been under control now  She has neuropathy on both hands and feet , not causing falls or loss of  of balance. She takes Duloxetine and Lyrica and seems to be controlling symptoms of neuropathy, pain level is 2/10 Still under the care of oncologist    Insomnia: she is now taking Lunesta 2 mg , given by oncologist and is doing well, able to fall and stay asleep. Stable    Cardiomegaly: on CT scan, however normal echo   Smoker's cough: trying to quit now, she has symbicort at home and takes it prn, however states insurance no longer going to cover, advised her to contact insurance to find out what is available on her plan    Migraines: she stopped topamax, taking imitrex prn, she denies recent episodes of migraine . No recent episodes. She is having dull headaches, not migraines lately    Depression Major:  recurrent, going on for a long time, she is back on Duloxetine since diagnosed with cancer Dec 2020. Wellbutrin was added but is under more stress, ,moved out of her daughter's house but now she is struggling to pay bills, CA125 going up and worried about recurrent of cancer. Phq 9 is higher, she is willing to try a mood stabilizer.   Dyslipidemia:  The 10-year ASCVD risk score (Arnett DK, et al., 2019) is: 4.2%   Values used to calculate the score:     Age: [age]years     Sex: Female     Is Non-Hispanic African American: No     Diabetic: No     Tobacco smoker: Yes     Systolic Blood Pressure: 1409mmHg     Is BP treated: No     HDL Cholesterol: 58 mg/dL     Total Cholesterol: 249 mg/dL   Pre-diabetes: A1C improved , down to 5.5 %  she states currently not having  polyphagia, polydipsia and polyuria   Elevated TSH: last level at goal   Patient Active Problem List   Diagnosis Date Noted   Moderate episode of recurrent major depressive disorder ([place] 06/17/2022   [place]' cough ([place] 05/17/2022   Dyslipidemia 05/17/2022   Elevated TSH 05/17/2022   Pre-diabetes 05/17/2022   Ventral  hernia without obstruction or gangrene 04/26/2021   Malignant ascites 12/22/2019   Pleural effusion on right 12/22/2019   Tachycardia 12/22/2019   Primary peritoneal carcinomatosis ([place]) 12/16/2019   Goals of care, counseling/discussion 12/16/2019   Erythrocytosis 11/12/2019   Morbid obesity ([place]) 07/07/2018   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   Fever blister 05/17/2018   Family history of breast cancer 05/08/2018   Family history of colon cancer in mother 05/08/2018   Migraine with aura and without status migrainosus 04/18/2018   Mild recurrent major depression ([place]) 01/20/2016   Esophageal reflux 01/20/2016   Osteoarthritis, multiple sites 01/20/2016    Past Surgical History:  Procedure Laterality Date   ABDOMINAL HYSTERECTOMY  03/2020   APPENDECTOMY     LSC but "ruptured when they did the surgery"    BREAST BIOPSY Left 01/04/2021   MRI BX   CESAREAN SECTION     CYSTOSCOPY N/A 04/08/2020   Procedure: CYSTOSCOPY;  [name], MD;  [name];  Service: Gynecology;  [name];   INSERTION OF MESH N/A 04/26/2021   Procedure: INSERTION OF MESH;  [name], MD;  [name];  Service: General;  [name];   IR THORACENTESIS ASP PLEURAL SPACE W/IMG GUIDE  12/06/2019   IUD REMOVAL N/A 04/08/2020   Procedure: INTRAUTERINE DEVICE (IUD) REMOVAL;  [name], MD;  [name];  Service: Gynecology;  [name];   PARACENTESIS     x6   PORTA CATH INSERTION N/A 04/23/2020   Procedure: PORTA CATH INSERTION;  [name], MD;  Location: [place] [facility];  Service: Cardiovascular;  [name];   TUBAL LIGATION     at time of CSxn   VENTRAL HERNIA REPAIR N/A 04/26/2021   Procedure: HERNIA REPAIR VENTRAL ADULT;  [name], MD;  [name];  Service: General;  [name];  need RNFA for the case   WRIST SURGERY Left 11/21/2016   plates and screws inserted    Family History  Adopted: Yes  Problem Relation Age of Onset   Lung cancer Father        deceased 9   Breast cancer Mother 19       currently 54   Colon cancer Mother    ADD / ADHD Son    ADD / ADHD Son    Early death Maternal Aunt    Breast cancer Maternal Aunt 21       deceased 41   Breast cancer Maternal Grandmother    Depression Daughter    Depression Daughter    Prostate cancer Paternal Uncle    Stroke Paternal Uncle    Leukemia Paternal Aunt    Breast cancer Paternal Grandmother    Cancer Maternal Uncle     Social History   Tobacco Use   Smoking status: Every Day    Packs/day: 1.00    Years: 30.00    Total pack years: 30.00    Types: Cigarettes   Smokeless tobacco: Never  Substance Use Topics   Alcohol use: Not Currently    Alcohol/week: 0.0 standard drinks of alcohol      Current Outpatient Medications:    budesonide-formoterol (SYMBICORT) 160-4.5 MCG/ACT inhaler, Inhale 2 puffs into the lungs 2 (two) times daily., Disp: 1 each, Rfl: 3   buPROPion (WELLBUTRIN XL) 300 MG 24 hr tablet, Take 1 tablet (300 mg total) by mouth daily., Disp: 90 tablet, Rfl: 0   cloNIDine (CATAPRES - DOSED IN  MG/24 HR) 0.3 mg/24hr patch, Place 1 patch (0.3 mg total) onto the skin once a week., Disp: 12 patch, Rfl: 3   cloNIDine (CATAPRES) 0.1 MG tablet, Take 1 tablet (0.1 mg total) by mouth at bedtime., Disp: 90 tablet, Rfl: 3   clotrimazole (LOTRIMIN) 1 % cream, Apply 1 application. topically 2 (two) times daily., Disp: 30 g, Rfl: 0   DULoxetine (CYMBALTA) 60 MG capsule, Take 1 capsule (60 mg total) by mouth daily., Disp: 90 capsule, Rfl: 0   eszopiclone (LUNESTA) 2 MG TABS tablet, Take 1 tablet (2 mg total) by mouth at bedtime as needed for sleep. Take immediately before bedtime, Disp: 30 tablet, Rfl: 2   loratadine (CLARITIN) 10 MG tablet, TAKE 1 TABLET(10 MG) BY MOUTH EVERY MORNING, Disp: 90 tablet, Rfl: 3   LORazepam (ATIVAN) 0.5 MG tablet, Take 1 tablet (0.5 mg total) by mouth every 8 (eight) hours as needed for anxiety., Disp: 60 tablet, Rfl: 0   metoprolol succinate (TOPROL-XL) 25 MG 24 hr tablet, TAKE 1/2 TABLET(12.5 MG) BY MOUTH DAILY, Disp: 45 tablet, Rfl: 1   Multiple Vitamin (MULTIVITAMIN WITH MINERALS) TABS tablet, Take 1 tablet by mouth daily., Disp: , Rfl:    nystatin (MYCOSTATIN/NYSTOP) powder, Apply 1 application. topically 3 (three) times daily., Disp: 15 g, Rfl: 0   olaparib (LYNPARZA) 100 MG tablet, Take 2 tablets (200 mg total) by mouth 2 (two) times daily. May take with food to decrease nausea and vomiting., Disp: 120 tablet, Rfl: 3   omeprazole (PRILOSEC) 20 MG capsule, TAKE 1 CAPSULE(20 MG) BY MOUTH DAILY, Disp: 90 capsule, Rfl: 2   ondansetron (ZOFRAN-ODT) 8 MG disintegrating tablet, Take 1 tablet (8 mg total) by mouth every 8 (eight) hours as needed for  nausea or vomiting., Disp: 30 tablet, Rfl: 3   oxyCODONE (OXY IR/ROXICODONE) 5 MG immediate release tablet, Take 0.5-1 tablets (2.5-5 mg total) by mouth every 6 (six) hours as needed for severe pain., Disp: 60 tablet, Rfl: 0   polyethylene glycol powder (GLYCOLAX/MIRALAX) 17 GM/SCOOP powder, Take 0.5 Containers by mouth daily as needed for mild constipation or moderate constipation., Disp: , Rfl:    pregabalin (LYRICA) 150 MG capsule, Take 150 mg by mouth 3 (three) times daily., Disp: , Rfl:    prochlorperazine (COMPAZINE) 10 MG tablet, Take 1 tablet (10 mg total) by mouth every 6 (six) hours as needed for nausea or vomiting., Disp: 40 tablet, Rfl: 3   Semaglutide,0.25 or 0.5MG/DOS, 2 MG/3ML SOPN, Inject 0.25 mg into the skin once a week., Disp: 1.5 mL, Rfl: 0   [START ON 10/02/2022] Semaglutide-Weight Management 1 MG/0.5ML SOAJ, Inject 1 mg into the skin once a week for 28 days. (Patient not taking: Reported on 09/20/2022), Disp: 2 mL, Rfl: 0   senna (SENOKOT) 8.6 MG tablet, Take 1 tablet by mouth daily as needed for constipation., Disp: , Rfl:    SUMAtriptan (IMITREX) 100 MG tablet, Take 1 tablet (100 mg total) by mouth every 2 (two) hours as needed for migraine. May repeat in 2 hours if headache persists or recurs., Disp: 10 tablet, Rfl: 2   valACYclovir (VALTREX) 1000 MG tablet, Take 1 tablet (1,000 mg total) by mouth 2 (two) times daily as needed., Disp: 6 tablet, Rfl: 2 No current facility-administered medications for this visit.  Facility-Administered Medications Ordered in Other Visits:    heparin lock flush 100 unit/mL, 250 Units, Intracatheter, Once PRN, Cammie Sickle, MD   ondansetron (ZOFRAN) 4 MG/2ML injection, , , ,  sodium chloride flush (NS) 0.9 % injection 10 mL, 10 mL, Intravenous, Once, Brahmanday, Govinda R, MD   sodium chloride flush (NS) 0.9 % injection 10 mL, 10 mL, Intracatheter, PRN, Cammie Sickle, MD  No Known Allergies  I personally reviewed active  problem list, medication list, allergies, family history, social history, health maintenance with the patient/caregiver today.   ROS  Constitutional: Negative for fever , positive for weight change.  Respiratory: positive for cough but no  shortness of breath.   Cardiovascular: Negative for chest pain or palpitations.  Gastrointestinal: Negative for abdominal pain, no bowel changes.  Musculoskeletal: Negative for gait problem or joint swelling.  Skin: Negative for rash.  Neurological: Negative for dizziness or headache.  No other specific complaints in a complete review of systems (except as listed in HPI above).   Objective  Vitals:   09/28/22 1106  BP: 124/74  Pulse: 86  Resp: 16  SpO2: 98%  Weight: 234 lb (106.1 kg)  Height: _0  (1.6 m)    Body mass index is 41.45 kg/m.  Physical Exam  Constitutional: Patient appears well-developed and well-nourished. Obese  No distress.  HEENT: head atraumatic, normocephalic, pupils equal and reactive to light, neck supple, throat within normal limits Cardiovascular: Normal rate, regular rhythm and normal heart sounds.  No murmur heard. No BLE edema. Pulmonary/Chest: Effort normal and breath sounds normal. No respiratory distress. Abdominal: Soft.  There is no tenderness. Psychiatric: Patient has a normal mood and affect. behavior is normal. Judgment and thought content normal.    PHQ2/9:    09/28/2022   11:06 AM 09/28/2022   10:44 AM 08/05/2022    9:18 AM 06/17/2022    2:31 PM 05/17/2022   11:24 AM  Depression screen PHQ 2/9  Decreased Interest _1 Down, Depressed, Hopeless _2 PHQ - 2 Score _3 Altered sleeping _4 Tired, decreased energy _5 Change in appetite  0 0 2 1  Feeling bad or failure about yourself  3 0 0 1 2  Trouble concentrating 2 0 0 1 2  Moving slowly or fidgety/restless  0 0 0 0  Suicidal thoughts  0 0 0 0  PHQ-9 Score _6 Difficult doing work/chores   Somewhat  difficult Somewhat difficult     phq 9 is positive   Fall Risk:    09/28/2022   10:57 AM 08/05/2022    9:18 AM 06/17/2022    2:31 PM 05/17/2022   11:24 AM 08/31/2021    1:43 PM  Fall Risk   Falls in the past year? 0 0 0 1 0  Number falls in past yr: 0 0 0 1 0  Injury with Fall? 0 0 0 0 0  Risk for fall due to : _7   Follow up Falls prevention discussed Falls prevention discussed;Education provided Falls prevention discussed;Education provided Falls prevention discussed Falls prevention discussed      Functional Status Survey: Is the patient deaf or have difficulty hearing?: No Does the patient have difficulty seeing, even when wearing glasses/contacts?: Yes Does the patient have difficulty concentrating, remembering, or making decisions?: Yes Does the patient have difficulty walking or climbing stairs?: Yes Does the patient have difficulty dressing or bathing?: No Does the patient have difficulty doing errands alone such  as visiting a doctor's office or shopping?: No    Assessment & Plan  1. Moderate episode of recurrent major depressive disorder (HCC)  - DULoxetine (CYMBALTA) 60 MG capsule; Take 1 capsule (60 mg total) by mouth daily.  Dispense: 90 capsule; Refill: 0 - buPROPion (WELLBUTRIN XL) 300 MG 24 hr tablet; Take 1 tablet (300 mg total) by mouth daily.  Dispense: 90 tablet; Refill: 0 - ARIPiprazole (ABILIFY) 2 MG tablet; Take 1 tablet (2 mg total) by mouth daily.  Dispense: 90 tablet; Refill: 0  2. Smokers' cough ([place])  She has Symbicort at home   3.Morbid obesity ([place])  Discussed with the patient the risk posed by an increased BMI. Discussed importance of portion control, calorie counting and at least 150 minutes of physical activity weekly. Avoid sweet beverages and drink more water. Eat at least 6 servings of fruit and vegetables daily    Doing well on Ozempic   4. Dyslipidemia   5. Need for  immunization against influenza  - Flu Vaccine QUAD 6+ mos PF IM (Fluarix Quad PF)  6. Metabolic syndrome   7. Pre-diabetes   8. GERD without esophagitis   9. BRCA1 gene mutation positive   10. Migraine with aura and without status migrainosus, not intractable  - metoprolol succinate (TOPROL-XL) 25 MG 24 hr tablet; TAKE 1/2 TABLET(12.5 MG) BY MOUTH DAILY  Dispense: 45 tablet; Refill: 1  11. DDD (degenerative disc disease), lumbar  - DULoxetine (CYMBALTA) 60 MG capsule; Take 1 capsule (60 mg total) by mouth daily.  Dispense: 90 capsule; Refill: 0  12. Tachycardia  - metoprolol succinate (TOPROL-XL) 25 MG 24 hr tablet; TAKE 1/2 TABLET(12.5 MG) BY MOUTH DAILY  Dispense: 45 tablet; Refill: 1  13. Intertrigo  - nystatin (MYCOSTATIN/NYSTOP) powder; Apply 1 Application topically 3 (three) times daily.  Dispense: 60 g; Refill: 0

## 2022-09-28 ENCOUNTER — Ambulatory Visit (INDEPENDENT_AMBULATORY_CARE_PROVIDER_SITE_OTHER): Payer: Medicare Other | Admitting: Family Medicine

## 2022-09-28 ENCOUNTER — Other Ambulatory Visit: Payer: Self-pay | Admitting: Hospice and Palliative Medicine

## 2022-09-28 ENCOUNTER — Ambulatory Visit: Payer: Self-pay | Admitting: *Deleted

## 2022-09-28 ENCOUNTER — Encounter: Payer: Self-pay | Admitting: Family Medicine

## 2022-09-28 VITALS — BP 124/74 | HR 86 | Resp 16 | Ht 63.0 in | Wt 234.0 lb

## 2022-09-28 DIAGNOSIS — L304 Erythema intertrigo: Secondary | ICD-10-CM

## 2022-09-28 DIAGNOSIS — E8881 Metabolic syndrome: Secondary | ICD-10-CM

## 2022-09-28 DIAGNOSIS — Z1501 Genetic susceptibility to malignant neoplasm of breast: Secondary | ICD-10-CM | POA: Diagnosis not present

## 2022-09-28 DIAGNOSIS — Z23 Encounter for immunization: Secondary | ICD-10-CM | POA: Diagnosis not present

## 2022-09-28 DIAGNOSIS — K219 Gastro-esophageal reflux disease without esophagitis: Secondary | ICD-10-CM | POA: Diagnosis not present

## 2022-09-28 DIAGNOSIS — M5136 Other intervertebral disc degeneration, lumbar region: Secondary | ICD-10-CM

## 2022-09-28 DIAGNOSIS — G43109 Migraine with aura, not intractable, without status migrainosus: Secondary | ICD-10-CM | POA: Diagnosis not present

## 2022-09-28 DIAGNOSIS — R000 Tachycardia, unspecified: Secondary | ICD-10-CM

## 2022-09-28 DIAGNOSIS — J410 Simple chronic bronchitis: Secondary | ICD-10-CM

## 2022-09-28 DIAGNOSIS — M51369 Other intervertebral disc degeneration, lumbar region without mention of lumbar back pain or lower extremity pain: Secondary | ICD-10-CM

## 2022-09-28 DIAGNOSIS — F331 Major depressive disorder, recurrent, moderate: Secondary | ICD-10-CM

## 2022-09-28 DIAGNOSIS — R7303 Prediabetes: Secondary | ICD-10-CM | POA: Diagnosis not present

## 2022-09-28 DIAGNOSIS — E785 Hyperlipidemia, unspecified: Secondary | ICD-10-CM

## 2022-09-28 DIAGNOSIS — Z1509 Genetic susceptibility to other malignant neoplasm: Secondary | ICD-10-CM

## 2022-09-28 MED ORDER — METOPROLOL SUCCINATE ER 25 MG PO TB24: ORAL_TABLET | ORAL | 1 refills | Status: DC

## 2022-09-28 MED ORDER — BUPROPION HCL ER (XL) 300 MG PO TB24: 300.0000 mg | ORAL_TABLET | Freq: Every day | ORAL | 0 refills | Status: DC

## 2022-09-28 MED ORDER — ARIPIPRAZOLE 2 MG PO TABS: 2.0000 mg | ORAL_TABLET | Freq: Every day | ORAL | 0 refills | Status: DC

## 2022-09-28 MED ORDER — NYSTATIN 100000 UNIT/GM EX POWD: 1.0000 | Freq: Three times a day (TID) | CUTANEOUS | 0 refills | Status: DC

## 2022-09-28 MED ORDER — DULOXETINE HCL 60 MG PO CPEP: 60.0000 mg | ORAL_CAPSULE | Freq: Every day | ORAL | 0 refills | Status: DC

## 2022-09-29 NOTE — Patient Outreach (Signed)
  Care Coordination   Initial Visit Note   09/29/2022 Name: TRENICE MESA MRN: [id number] DOB: 07/04/1971  ADDALIE CALLES is a 51 y.o. year old female who sees Steele Sizer, MD for primary care. I spoke with  [name] by phone today.  What matters to the patients health and wellness today?  Mental Health Counseling and Resources    Goals Addressed               This Visit's Progress     Mental helath counseling (pt-stated)        Care Coordination Interventions: Patient discussed recent move back to [place], states that the move has been beneficial emotionally however rent has increased Patient agreeable to outpatient therapy and would like resources PHQ2/PHQ9 completed Solution-Focused Strategies employed:  Active listening / Reflection utilized  Emotional Support Provided         SDOH assessments and interventions completed:  Yes  SDOH Interventions Today    Flowsheet Row Most Recent Value  SDOH Interventions   Food Insecurity Interventions Intervention Not Indicated  [receives food stamps]  Housing Interventions Intervention Not Indicated  Transportation Interventions Intervention Not Indicated  Utilities Interventions Intervention Not Indicated  Stress Interventions Other (Comment)  [will refer for counseling]        Care Coordination Interventions Activated:  Yes  Care Coordination Interventions:  Yes, provided   Follow up plan: Follow up call scheduled for 10/13/22    Encounter Outcome:  Pt. Visit Completed

## 2022-09-29 NOTE — Patient Instructions (Signed)
Visit Information  Thank you for taking time to visit with me today. Please don't hesitate to contact me if I can be of assistance to you.   Following are the goals we discussed today:   Goals Addressed               This Visit's Progress     Mental helath counseling (pt-stated)        Care Coordination Interventions: Patient discussed recent move back to [place], states that the move has been beneficial emotionally however rent has increased Patient agreeable to outpatient therapy and would like resources PHQ2/PHQ9 completed Solution-Focused Strategies employed:  Active listening / Reflection utilized  Emotional Support Provided         Our next appointment is by telephone on 10/06/22 at 2:30pm  Please call the care guide team at [phone] if you need to cancel or reschedule your appointment.   If you are experiencing a Mental Health or [place] or need someone to talk to, please call the Suicide and Crisis Lifeline: 988   Patient verbalizes understanding of instructions and care plan provided today and agrees to view in [place]. Active MyChart status and patient understanding of how to access instructions and care plan via MyChart confirmed with patient.     Telephone follow up appointment with care management team member scheduled for: 10/06/22  Elliot Gurney, [place] Worker  [facility] Care Management [phone]

## 2022-09-30 ENCOUNTER — Encounter: Payer: Self-pay | Admitting: Internal Medicine

## 2022-09-30 ENCOUNTER — Encounter: Payer: Self-pay | Admitting: Family Medicine

## 2022-09-30 ENCOUNTER — Other Ambulatory Visit: Payer: Self-pay | Admitting: Family Medicine

## 2022-09-30 DIAGNOSIS — Z1509 Genetic susceptibility to other malignant neoplasm: Secondary | ICD-10-CM

## 2022-10-06 ENCOUNTER — Telehealth: Payer: Self-pay | Admitting: *Deleted

## 2022-10-06 ENCOUNTER — Encounter: Payer: Self-pay | Admitting: *Deleted

## 2022-10-06 NOTE — Patient Outreach (Signed)
  Care Coordination   10/06/2022 Name: [name] MRN: [id number] DOB: 12/17/1971   Care Coordination Outreach Attempts:  An unsuccessful telephone outreach was attempted today to offer the patient information about available care coordination services as a benefit of their health plan.   Follow Up Plan:  Additional outreach attempts will be made to offer the patient care coordination information and services.   Encounter Outcome:  No Answer  Care Coordination Interventions Activated:  No   Care Coordination Interventions:  No, not indicated    [name], [place] Worker  [facility] Care Management [phone]

## 2022-10-07 ENCOUNTER — Other Ambulatory Visit: Payer: Self-pay | Admitting: Family Medicine

## 2022-10-10 ENCOUNTER — Inpatient Hospital Stay
Admission: RE | Admit: 2022-10-10 | Discharge: 2022-10-10 | Disposition: A | Discharge status: Home / Self Care | Payer: Self-pay | Source: Ambulatory Visit | Attending: *Deleted | Admitting: *Deleted

## 2022-10-10 ENCOUNTER — Other Ambulatory Visit: Payer: Self-pay | Admitting: *Deleted

## 2022-10-10 DIAGNOSIS — Z1231 Encounter for screening mammogram for malignant neoplasm of breast: Secondary | ICD-10-CM

## 2022-10-11 ENCOUNTER — Inpatient Hospital Stay: Payer: Medicare Other

## 2022-10-11 ENCOUNTER — Encounter: Payer: Self-pay | Admitting: Internal Medicine

## 2022-10-11 ENCOUNTER — Inpatient Hospital Stay: Payer: Medicare Other | Admitting: Internal Medicine

## 2022-10-11 ENCOUNTER — Other Ambulatory Visit: Payer: Self-pay | Admitting: Internal Medicine

## 2022-10-11 DIAGNOSIS — C482 Malignant neoplasm of peritoneum, unspecified: Secondary | ICD-10-CM

## 2022-10-11 NOTE — Assessment & Plan Note (Deleted)
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 On avastin + lynparza-STABLE.  CT AP SEP 5th, 2023-  NED.STABLE.  #  PROCEED WITH Avastin treatment today [see discussion below].  Labs reviewed.;  CBC- platelets >100; CMP are reviewed; adequate today.JUNE  2023-NEG.  Continue Lynparza 200 mg twice a day [severe anemia while on 250 mg twice a day].  Ca1 2 5 -normal limits BUT RISING- AUG, 223- 18. Reviewed the CT scna SEP 2023-NEG for any recurrent cancer. STABLE.  # Abdominal hernia-CT scan sep- ventral hernia noted; no obstruction.  October appt with surgeon at [facility] will have to hold avastin 4-6 weeks prior and after.   # Hypertension: Blood pressure 112/80s-   Monitor closely on Avastin.   Continue metoprolol/clonidine hot flashes. STABLE.  # PN G-2/back pain [awaiting steroid injection]- on Lyrica 50 mg TID-STABLE.  # Nausea-sec to [employer]; worsened by semaglutide.  Continue Zofran/ compazine prn monitor for now.   #IV access/Mediport-currently s/p TPA- port flush  # DISPOSITION: schedule 10-11 am appt #  avastin today- ok with out urine protein # in 3 weeks - MD;   Avastin; port/labs-  cbc/cmp; ca-125; urine random protein:creatiine ratio; Dr.B

## 2022-10-11 NOTE — Telephone Encounter (Signed)
Left voicemail for patient to call triage or respond to MyChart message.

## 2022-10-12 ENCOUNTER — Other Ambulatory Visit: Payer: Self-pay | Admitting: Internal Medicine

## 2022-10-12 ENCOUNTER — Inpatient Hospital Stay (HOSPITAL_BASED_OUTPATIENT_CLINIC_OR_DEPARTMENT_OTHER): Payer: Medicare Other | Admitting: Hospice and Palliative Medicine

## 2022-10-12 DIAGNOSIS — Z515 Encounter for palliative care: Secondary | ICD-10-CM

## 2022-10-12 DIAGNOSIS — Z5112 Encounter for antineoplastic immunotherapy: Secondary | ICD-10-CM | POA: Insufficient documentation

## 2022-10-12 DIAGNOSIS — C482 Malignant neoplasm of peritoneum, unspecified: Secondary | ICD-10-CM | POA: Insufficient documentation

## 2022-10-12 DIAGNOSIS — Z79899 Other long term (current) drug therapy: Secondary | ICD-10-CM | POA: Insufficient documentation

## 2022-10-12 MED ORDER — ESZOPICLONE 3 MG PO TABS: 3.0000 mg | ORAL_TABLET | Freq: Every day | ORAL | 0 refills | Status: DC

## 2022-10-12 NOTE — Progress Notes (Signed)
Virtual Visit via Telephone Note  I connected with [name] on 10/12/22 at  1:00 PM EDT by telephone and verified that I am speaking with the correct person using two identifiers.  Location: Patient: Home Provider: Clinic   I discussed the limitations, risks, security and privacy concerns of performing an evaluation and management service by telephone and the availability of in person appointments. I also discussed with the patient that there may be a patient responsible charge related to this service. The patient expressed understanding and agreed to proceed.   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  Patient is also status post neoadjuvant carbo/Taxol on maintenance Avastin/Lynparza.  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  Patient has also had chronic abdominal pain.  She was referred to palliative care to help address goals and manage ongoing symptoms.   Observations/Objective: Routine follow-up.    I called and spoke with patient by phone.  She says she is doing okay, although she has been somewhat more stressed and depressed feeling given slow rising tumor markers.  We discussed management of her depression.  Patient is on antidepressants prescribed by her PCP.  I encouraged patient to consider counseling and she says that her PCP is working to try to find someone for her to see.  I suggested that we could refer her to our LCSW but she wanted to hold for now to see if her PCP was able to find someone.  Patient reports that she is not sleeping as well as she previously did.  She is  often waking at night and having difficulty getting back to sleep and that has resulted in her feeling more tired and fatigued during the day.  She was previously having good effect with the Lunesta, which had been slowly titrated up to 2 mg nightly.  I suspect that her insomnia is more attributed to her depression and anxiety at this point.  However, patient is interested in trying a higher dose of the Lunesta and I agreed to increase to 3 mg nightly but explained that that is the max dose and if that is ineffective we would have to try other strategies.  Patient reports pain is stable on as needed oxycodone.  Patient says that she occasionally feels like food is getting stuck or that she is having difficulty swallowing.  It is not a consistent problem but she is interested in evaluation.  Will refer to SLP.  Assessment and Plan: Stage IV serous adenocarcinoma -on systemic chemotherapy.  Slowly uptrending CA 125. Followed by Dr. Rogue Bussing who sees patient   Neoplasm related pain-continue oxycodone.   Depression/Anxiety - on Wellbutrin and Cymbalta prescribed by PCP. Also on PRN lorazepam.  Recommend counseling  Insomnia -Increase Lunesta 3mg  QHS  Dysphagia -referral to SLP  Follow Up Instructions: Follow-up telephone visit 1-2 months   I discussed the assessment and treatment plan with the patient. The patient was provided an opportunity to ask questions and all were answered. The patient agreed with the plan and demonstrated an understanding of the instructions.   The patient was advised to call back or seek an in-person evaluation if the symptoms worsen or if the condition fails to improve as anticipated.  I provided 15 minutes of non-face-to-face time during this encounter.   Irean Hong, NP

## 2022-10-13 ENCOUNTER — Encounter: Payer: Self-pay | Admitting: Family Medicine

## 2022-10-14 ENCOUNTER — Other Ambulatory Visit: Payer: Self-pay | Admitting: Family Medicine

## 2022-10-14 DIAGNOSIS — M47816 Spondylosis without myelopathy or radiculopathy, lumbar region: Secondary | ICD-10-CM | POA: Diagnosis not present

## 2022-10-14 MED ORDER — SEMAGLUTIDE (1 MG/DOSE) 4 MG/3ML ~~LOC~~ SOPN: 1.0000 mg | PEN_INJECTOR | SUBCUTANEOUS | 2 refills | Status: DC

## 2022-10-18 ENCOUNTER — Telehealth: Payer: Self-pay

## 2022-10-18 ENCOUNTER — Ambulatory Visit: Payer: Self-pay | Admitting: *Deleted

## 2022-10-18 ENCOUNTER — Other Ambulatory Visit: Payer: Self-pay | Admitting: Family Medicine

## 2022-10-18 ENCOUNTER — Ambulatory Visit: Payer: Self-pay

## 2022-10-18 MED ORDER — HYDROXYZINE HCL 10 MG PO TABS: 10.0000 mg | ORAL_TABLET | Freq: Every evening | ORAL | 0 refills | Status: DC | PRN

## 2022-10-18 NOTE — Patient Instructions (Signed)
Visit Information  Thank you for taking time to visit with me today. Please don't hesitate to contact me if I can be of assistance to you.   Following are the goals we discussed today:   Goals Addressed               This Visit's Progress     Mental health counseling (pt-stated)        Care Coordination Interventions: Phone call from patient stating need to talk,  This social worker assessed patient for safety with patient denying thoughts of harm to herself or other Patient agreeable to follow up call on 10/20/22 af 12pm          Our next appointment is by telephone on 10/20/22 at 12pm  Please call the care guide team at [phone] if you need to cancel or reschedule your appointment.   If you are experiencing a Mental Health or [place] or need someone to talk to, please call the Suicide and Crisis Lifeline: 988   Patient verbalizes understanding of instructions and care plan provided today and agrees to view in [place]. Active MyChart status and patient understanding of how to access instructions and care plan via MyChart confirmed with patient.     Telephone follow up appointment with care management team member scheduled for: 10/20/22  Elliot Gurney, [place] Worker  [facility] Care Management [phone]

## 2022-10-18 NOTE — Telephone Encounter (Signed)
Chief Complaint: Anxiety Symptoms: Can't eat or sleep, very upset Frequency: 3 months Pertinent Negatives: Patient denies self harm Disposition: [] ED /[] Urgent Care (no appt availability in office) / [x] Appointment(In office/virtual)/ []  [place] [facility]/ [] Home Care/ [] Refused Recommended Disposition /[] [place] [facility]/ []  Follow-up with PCP Additional Notes: Pt called to get medication to help with anxiety. Pt is dealing with a lot right now. She is having chemo treatments. Her 80 year old daughter has addiction problems. Daughter went to [place] [facility] where she entered detox and then rehab.  Daughter was doing well, but then eloped with a man also from the rehab. Daughter has been missing for 3 months. Pt filed a missing persons report. Pt was notified that they were going to request a DNA sample from her, as well as the daughter's dental records. Police would not give any additional information.  Pt was going into the Cancer center when I called. I gave the pt the [facility] phone number. I encouraged her to call them if needed. And scheduled appt for tomorrow morning. PT wanted medication for tonight. I directed her to UC or ED if needed.  Pt is requesting medication to sleep for tonight.   Summary: behavioral health / rx req   The patient has shared that they're experiencing some behavioral health concerns   The patient shares that they've experienced an increase in stress and would like to be prescribed something for their stress   Please contact further when possible      Reason for Disposition  Patient sounds very upset or troubled to the triager  Answer Assessment - Initial Assessment Questions 1. CONCERN: "Did anything happen that prompted you to call today?"      Police department will be asking her for DNA and dental records for her missing daughter. 2. ANXIETY SYMPTOMS: "Can you describe how you (your loved one; patient) have been feeling?" (e.g., tense,  restless, panicky, anxious, keyed up, overwhelmed, sense of impending doom).      Anxious, not sleeping or eating 3. ONSET: "How long have you been feeling this way?" (e.g., hours, days, weeks)     3 moths 4. SEVERITY: "How would you rate the level of anxiety?" (e.g., 0 - 10; or mild, moderate, severe).     moderate 5. FUNCTIONAL IMPAIRMENT: "How have these feelings affected your ability to do daily activities?" "Have you had more difficulty than usual doing your normal daily activities?" (e.g., getting better, same, worse; self-care, school, work, interactions)     yes 6. HISTORY: "Have you felt this way before?" "Have you ever been diagnosed with an anxiety problem in the past?" (e.g., generalized anxiety disorder, panic attacks, PTSD). If Yes, ask: "How was this problem treated?" (e.g., medicines, counseling, etc.)      7. RISK OF HARM - SUICIDAL IDEATION: "Do you ever have thoughts of hurting or killing yourself?" If Yes, ask:  "Do you have these feelings now?" "Do you have a plan on how you would do this?"     no 8. TREATMENT:  "What has been done so far to treat this anxiety?" (e.g., medicines, relaxation strategies). "What has helped?"      9. TREATMENT - THERAPIST: "Do you have a counselor or therapist? Name?"     yes 10. POTENTIAL TRIGGERS: "Do you drink caffeinated beverages (e.g., coffee, colas, teas), and how much daily?" "Do you drink alcohol or use any drugs?" "Have you started any new medicines recently?"        11.  PATIENT SUPPORT: "Who is with you now?" "Who do you live with?" "Do you have family or friends who you can talk to?"         12. OTHER SYMPTOMS: "Do you have any other symptoms?" (e.g., feeling depressed, trouble concentrating, trouble sleeping, trouble breathing, palpitations or fast heartbeat, chest pain, sweating, nausea, or diarrhea)       Trouble sleeping, can't eat.  13. PREGNANCY: "Is there any chance you are pregnant?" "When was your last menstrual  period?"  Protocols used: Anxiety and Panic Attack-A-AH

## 2022-10-18 NOTE — Patient Outreach (Signed)
  Care Coordination   Acute Call-  Visit Note   10/18/2022 Name: [name] MRN: [id number] DOB: May 28, 1971  [name] is a 51 y.o. year old female who sees Steele Sizer, MD for primary care. I spoke with  [name] by phone today.  What matters to the patients health and wellness today?  Mental health support    Goals Addressed               This Visit's Progress     Mental health counseling (pt-stated)        Care Coordination Interventions: Phone call from patient stating need to talk,  This social worker assessed patient for safety with patient denying thoughts of harm to herself or other Patient agreeable to follow up call on 10/20/22 af 12pm          SDOH assessments and interventions completed:  Yes     Care Coordination Interventions Activated:  Yes  Care Coordination Interventions:  Yes, provided   Follow up plan: Follow up call scheduled for 10/20/22 12pm    Encounter Outcome:  Pt. Visit Completed

## 2022-10-18 NOTE — Telephone Encounter (Signed)
See previous nurse triage message. Per Dr. Ancil Boozer request I inquired if patient is currently taking her Ativan and Oxycodone.  Patient stated for Ativan she "hasn't had in awhile", she estimated "months" Patient stated for Oxycodone she "takes as needed. Very rarely"  I did the PHQ-9 and GAD-7 while on the phone.  PHQ-9: 16, very difficult (positive for little interest, depressed, sleep, tired, low appetite, trouble concentration, and feeling bad about herself) GAD-7: 20, very difficult (every question was a 3: Nearly every day with the exception of question 6. "becoming easily annoyed or irritable"= 2 more than half the days)

## 2022-10-18 NOTE — Telephone Encounter (Signed)
Spoke with patient and informed her that Dr. Ancil Boozer has sent in hydroxyzine to her pharmacy.  Patient stated that she can't take hydroxyzine because it makes her jittery and loopy.

## 2022-10-19 ENCOUNTER — Ambulatory Visit: Payer: Medicare Other | Admitting: Nurse Practitioner

## 2022-10-19 NOTE — Progress Notes (Deleted)
LMP 06/19/2017    Subjective:    Patient ID: [name], female    DOB: [birth year], 51 y.o.   MRN: [id number]  HPI: [name] is a 51 y.o. female  No chief complaint on file.  Depression/anxiety/insomnia: Patient is currently being treated for ovarian cancer.  She is also having increased stress due to her daughter with addiction issues recently left rehab.  She says she has been struggling and has been unable to sleep. Patient is currently on Wellbutrin 300 mg daily, duloxetine 60 mg daily and recently started Abilify 2 mg daily.  She does have ativan on her medication list, however patient states she has not had that in many months.  She also has oxycodone on her list but she only uses it every once in awhile.  Patient had requested medication to help her sleep yesterday and Dr. Ancil Boozer prescribed her hydroxyzine.  Patient reports ***.     09/28/2022   11:06 AM 09/28/2022   10:44 AM 08/05/2022    9:18 AM 06/17/2022    2:31 PM 05/17/2022   11:24 AM  Depression screen PHQ 2/9  Decreased Interest 3 2 2 2 2   Down, Depressed, Hopeless 3 2 2 2 2   PHQ - 2 Score 6 4 4 4 4   Altered sleeping 2 2 2 2 3   Tired, decreased energy 3 2 2 2 3   Change in appetite  0 0 2 1  Feeling bad or failure about yourself  3 0 0 1 2  Trouble concentrating 2 0 0 1 2  Moving slowly or fidgety/restless  0 0 0 0  Suicidal thoughts  0 0 0 0  PHQ-9 Score 16 8 8 12 15   Difficult doing work/chores   Somewhat difficult Somewhat difficult         Relevant past medical, surgical, family and social history reviewed and updated as indicated. Interim medical history since our last visit reviewed. Allergies and medications reviewed and updated.  Review of Systems  Per HPI unless specifically indicated above     Objective:    LMP 06/19/2017   Wt Readings from Last 3 Encounters:  09/28/22 234 lb (106.1 kg)  09/20/22 236 lb (107 kg)  08/30/22 238 lb 9.6 oz (108.2 kg)    Physical Exam  Results for  orders placed or performed in visit on 09/20/22  Urinalysis, dipstick only  Result Value Ref Range   Color, Urine AMBER (A) YELLOW   APPearance CLOUDY (A) CLEAR   Specific Gravity, Urine 1.025 1.005 - 1.030   pH 5.0 5.0 - 8.0   Glucose, UA NEGATIVE NEGATIVE mg/dL   Hgb urine dipstick NEGATIVE NEGATIVE   Bilirubin Urine NEGATIVE NEGATIVE   Ketones, ur NEGATIVE NEGATIVE mg/dL   Protein, ur 30 (A) NEGATIVE mg/dL   Nitrite NEGATIVE NEGATIVE   Leukocytes,Ua NEGATIVE NEGATIVE  Comprehensive metabolic panel  Result Value Ref Range   Sodium 138 135 - 145 mmol/L   Potassium 3.7 3.5 - 5.1 mmol/L   Chloride 106 98 - 111 mmol/L   CO2 27 22 - 32 mmol/L   Glucose, Bld 96 70 - 99 mg/dL   BUN 9 6 - 20 mg/dL   Creatinine, Ser 0.80 0.44 - 1.00 mg/dL   Calcium 9.1 8.9 - 10.3 mg/dL   Total Protein 7.0 6.5 - 8.1 g/dL   Albumin 3.9 3.5 - 5.0 g/dL   AST 21 15 - 41 U/L   ALT 15 0 - 44 U/L   Alkaline  Phosphatase 51 38 - 126 U/L   Total Bilirubin 0.6 0.3 - 1.2 mg/dL   GFR, Estimated >60 >60 mL/min   Anion gap 5 5 - 15  CBC with Differential  Result Value Ref Range   WBC 4.3 4.0 - 10.5 K/uL   RBC 3.90 3.87 - 5.11 MIL/uL   Hemoglobin 15.0 12.0 - 15.0 g/dL   HCT 44.3 36.0 - 46.0 %   MCV 113.6 (H) 80.0 - 100.0 fL   MCH 38.5 (H) 26.0 - 34.0 pg   MCHC 33.9 30.0 - 36.0 g/dL   RDW 15.2 11.5 - 15.5 %   Platelets 116 (L) 150 - 400 K/uL   nRBC 0.0 0.0 - 0.2 %   Neutrophils Relative % 56 %   Neutro Abs 2.4 1.7 - 7.7 K/uL   Lymphocytes Relative 32 %   Lymphs Abs 1.4 0.7 - 4.0 K/uL   Monocytes Relative 9 %   Monocytes Absolute 0.4 0.1 - 1.0 K/uL   Eosinophils Relative 1 %   Eosinophils Absolute 0.1 0.0 - 0.5 K/uL   Basophils Relative 1 %   Basophils Absolute 0.0 0.0 - 0.1 K/uL   Immature Granulocytes 1 %   Abs Immature Granulocytes 0.02 0.00 - 0.07 K/uL  Protein Electro, Random Urine  Result Value Ref Range   Total Protein, Urine 28.8 Not Estab. mg/dL   Albumin ELP, Urine 52.5 %    Alpha-1-Globulin, U 3.1 %   Alpha-2-Globulin, U 12.4 %   Beta Globulin, U 20.7 %   Gamma Globulin, U 11.2 %   M Component, Ur Not Observed Not Observed %   Please Note: Comment   CA 125  Result Value Ref Range   Cancer Antigen (CA) 125 18.2 0.0 - 38.1 U/mL      Assessment & Plan:   Problem List Items Addressed This Visit   None    Follow up plan: No follow-ups on file.

## 2022-10-20 ENCOUNTER — Ambulatory Visit: Payer: Self-pay | Admitting: *Deleted

## 2022-10-20 ENCOUNTER — Encounter: Payer: Self-pay | Admitting: Internal Medicine

## 2022-10-20 ENCOUNTER — Telehealth: Payer: Self-pay | Admitting: *Deleted

## 2022-10-20 NOTE — Patient Instructions (Signed)
Visit Information  Thank you for taking time to visit with me today. Please don't hesitate to contact me if I can be of assistance to you.   Following are the goals we discussed today:   Goals Addressed               This Visit's Progress     Mental health counseling (pt-stated)        Care Coordination Interventions: Return phone call from patient, challenges/struggles with family-possible loss of daughter but information has not been confirmed by police department -missing persons report filed Per patient, last few days have been difficult-difficulty sleeping, concentrating, frequent crying spells, requesting medication to address listed symptoms and has been in contact with her providers office  Financial challenges discussed-rent has increased-patient unemployed-contacting [employer] encouraged for any possible assistance-[address] [employer] not accepting applications at this time Per patient, the Cancer center has provided patient with gas cards in the past and has received assistance car payment and electric bill, she is not sure if the [employer] had been used in the past Verbalization of feelings encouraged, emotional support provided, Coping strategies discussed-maintaining a positive perspective, focusing on 1 major tasks per day, taking one day at time, accepting support from family and friends Mental health resources [place] [phone], [facility] [phone] and [facility] Pester [phone]         Our next appointment is by telephone on 10/27/22 at 10/27/22  Please call the care guide team at [phone] if you need to cancel or reschedule your appointment.   If you are experiencing a Mental Health or [place] or need someone to talk to, please call the Suicide and Crisis Lifeline: 988 call 911   Patient verbalizes understanding of instructions and care plan provided today and agrees to view in [place]. Active MyChart status and  patient understanding of how to access instructions and care plan via MyChart confirmed with patient.     Telephone follow up appointment with care management team member scheduled for: 10/27/22  Elliot Gurney, [place] Worker  [facility] Care Management [phone]

## 2022-10-20 NOTE — Patient Outreach (Signed)
  Care Coordination   Follow Up Visit Note   10/20/2022 Name: [name] MRN: [id number] DOB: 1971-01-31  [name] is a 51 y.o. year old female who sees Steele Sizer, MD for primary care. I spoke with  [name] by phone today.  What matters to the patients health and wellness today?  Mental Health Counseling and resources    Goals Addressed               This Visit's Progress     Mental health counseling (pt-stated)        Care Coordination Interventions: Return phone call from patient, challenges/struggles with family-possible loss of daughter but information has not been confirmed by police department -missing persons report filed Per patient, last few days have been difficult-difficulty sleeping, concentrating, frequent crying spells, requesting medication to address listed symptoms and has been in contact with her providers office  Financial challenges discussed-rent has increased-patient unemployed-contacting [employer] encouraged for any possible assistance-[employer] not accepting applications at this time Per patient, the Cancer center has provided patient with gas cards in the past and has received assistance car payment and electric bill, she is not sure if the [employer] had been used in the past Verbalization of feelings encouraged, emotional support provided, Coping strategies discussed-maintaining a positive perspective, focusing on 1 major tasks per day, taking one day at time, accepting support from family and friends Mental health resources [facility] [phone], [facility] [phone] and [facility] Pester [phone]          SDOH assessments and interventions completed:  No     Care Coordination Interventions Activated:  Yes  Care Coordination Interventions:  Yes, provided   Follow up plan: Follow up call scheduled for 11/03/22    Encounter Outcome:  Pt. Visit Completed

## 2022-10-20 NOTE — Patient Outreach (Signed)
  Care Coordination   10/20/2022 Name: [name] MRN: [id number] DOB: 06-Oct-1971   Care Coordination Outreach Attempts:  An unsuccessful telephone outreach was attempted today to offer the patient information about available care coordination services as a benefit of their health plan.   Follow Up Plan:  Additional outreach attempts will be made to offer the patient care coordination information and services.   Encounter Outcome:  No Answer  Care Coordination Interventions Activated:  No   Care Coordination Interventions:  No, not indicated    Sophiah Rolin, [place] Worker  [facility] Care Management [phone]

## 2022-10-21 ENCOUNTER — Inpatient Hospital Stay: Payer: Medicare Other

## 2022-10-21 ENCOUNTER — Other Ambulatory Visit: Payer: Medicare Other

## 2022-10-21 ENCOUNTER — Encounter: Payer: Self-pay | Admitting: Internal Medicine

## 2022-10-21 ENCOUNTER — Ambulatory Visit: Payer: Medicare Other

## 2022-10-21 ENCOUNTER — Ambulatory Visit: Payer: Medicare Other | Admitting: Internal Medicine

## 2022-10-21 ENCOUNTER — Inpatient Hospital Stay: Payer: Medicare Other | Attending: Internal Medicine | Admitting: Internal Medicine

## 2022-10-21 VITALS — BP 114/83 | HR 71 | Temp 96.0°F | Resp 18 | Wt 227.0 lb

## 2022-10-21 DIAGNOSIS — Z7189 Other specified counseling: Secondary | ICD-10-CM

## 2022-10-21 DIAGNOSIS — F419 Anxiety disorder, unspecified: Secondary | ICD-10-CM | POA: Insufficient documentation

## 2022-10-21 DIAGNOSIS — I10 Essential (primary) hypertension: Secondary | ICD-10-CM | POA: Insufficient documentation

## 2022-10-21 DIAGNOSIS — Z79899 Other long term (current) drug therapy: Secondary | ICD-10-CM | POA: Diagnosis not present

## 2022-10-21 DIAGNOSIS — C482 Malignant neoplasm of peritoneum, unspecified: Secondary | ICD-10-CM

## 2022-10-21 DIAGNOSIS — F17210 Nicotine dependence, cigarettes, uncomplicated: Secondary | ICD-10-CM | POA: Insufficient documentation

## 2022-10-21 DIAGNOSIS — Z5112 Encounter for antineoplastic immunotherapy: Secondary | ICD-10-CM | POA: Insufficient documentation

## 2022-10-21 DIAGNOSIS — G629 Polyneuropathy, unspecified: Secondary | ICD-10-CM | POA: Diagnosis not present

## 2022-10-21 LAB — CBC WITH DIFFERENTIAL/PLATELET
Abs Immature Granulocytes: 0.02 10*3/uL (ref 0.00–0.07)
Basophils Absolute: 0.1 10*3/uL (ref 0.0–0.1)
Basophils Relative: 1 %
Eosinophils Absolute: 0.1 10*3/uL (ref 0.0–0.5)
Eosinophils Relative: 2 %
HCT: 44.2 % (ref 36.0–46.0)
Hemoglobin: 15.2 g/dL — ABNORMAL HIGH (ref 12.0–15.0)
Immature Granulocytes: 0 %
Lymphocytes Relative: 38 %
Lymphs Abs: 1.8 10*3/uL (ref 0.7–4.0)
MCH: 39.4 pg — ABNORMAL HIGH (ref 26.0–34.0)
MCHC: 34.4 g/dL (ref 30.0–36.0)
MCV: 114.5 fL — ABNORMAL HIGH (ref 80.0–100.0)
Monocytes Absolute: 0.4 10*3/uL (ref 0.1–1.0)
Monocytes Relative: 9 %
Neutro Abs: 2.3 10*3/uL (ref 1.7–7.7)
Neutrophils Relative %: 50 %
Platelets: 130 10*3/uL — ABNORMAL LOW (ref 150–400)
RBC: 3.86 MIL/uL — ABNORMAL LOW (ref 3.87–5.11)
RDW: 15.4 % (ref 11.5–15.5)
WBC: 4.7 10*3/uL (ref 4.0–10.5)
nRBC: 0 % (ref 0.0–0.2)

## 2022-10-21 LAB — URINALYSIS, DIPSTICK ONLY
Bilirubin Urine: NEGATIVE
Glucose, UA: NEGATIVE mg/dL
Hgb urine dipstick: NEGATIVE
Ketones, ur: NEGATIVE mg/dL
Nitrite: NEGATIVE
Protein, ur: 30 mg/dL — AB
Specific Gravity, Urine: 1.024 (ref 1.005–1.030)
pH: 5 (ref 5.0–8.0)

## 2022-10-21 LAB — COMPREHENSIVE METABOLIC PANEL
ALT: 18 U/L (ref 0–44)
AST: 24 U/L (ref 15–41)
Albumin: 4.1 g/dL (ref 3.5–5.0)
Alkaline Phosphatase: 53 U/L (ref 38–126)
Anion gap: 7 (ref 5–15)
BUN: 12 mg/dL (ref 6–20)
CO2: 23 mmol/L (ref 22–32)
Calcium: 9.1 mg/dL (ref 8.9–10.3)
Chloride: 108 mmol/L (ref 98–111)
Creatinine, Ser: 0.76 mg/dL (ref 0.44–1.00)
GFR, Estimated: >60 mL/min (ref >60)
Glucose, Bld: 102 mg/dL — ABNORMAL HIGH (ref 70–99)
Potassium: 3.9 mmol/L (ref 3.5–5.1)
Sodium: 138 mmol/L (ref 135–145)
Total Bilirubin: 0.9 mg/dL (ref 0.3–1.2)
Total Protein: 7.2 g/dL (ref 6.5–8.1)

## 2022-10-21 MED ORDER — SODIUM CHLORIDE 0.9 % IV SOLN
15.0000 mg/kg | Freq: Once | INTRAVENOUS | Status: AC
  Administered 2022-10-21: 1500 mg via INTRAVENOUS
  Filled 2022-10-21: qty 48

## 2022-10-21 MED ORDER — LORAZEPAM 0.5 MG PO TABS: 0.5000 mg | ORAL_TABLET | Freq: Two times a day (BID) | ORAL | 0 refills | Status: DC | PRN

## 2022-10-21 MED ORDER — SODIUM CHLORIDE 0.9% FLUSH
10.0000 mL | Freq: Once | INTRAVENOUS | Status: AC
  Administered 2022-10-21: 10 mL via INTRAVENOUS
  Filled 2022-10-21: qty 10

## 2022-10-21 MED ORDER — SODIUM CHLORIDE 0.9 % IV SOLN
Freq: Once | INTRAVENOUS | Status: AC
  Filled 2022-10-21: qty 250

## 2022-10-21 MED ORDER — HEPARIN SOD (PORK) LOCK FLUSH 100 UNIT/ML IV SOLN
500.0000 [IU] | Freq: Once | INTRAVENOUS | Status: AC | PRN
  Filled 2022-10-21: qty 5

## 2022-10-21 MED ORDER — ONDANSETRON HCL 4 MG/2ML IJ SOLN
4.0000 mg | Freq: Once | INTRAMUSCULAR | Status: AC
  Administered 2022-10-21: 4 mg via INTRAVENOUS
  Filled 2022-10-21: qty 2

## 2022-10-21 MED ORDER — SODIUM CHLORIDE 0.9 % IV SOLN: 15.0000 mg/kg | Freq: Once | INTRAVENOUS | Status: DC

## 2022-10-21 MED ORDER — HEPARIN SOD (PORK) LOCK FLUSH 100 UNIT/ML IV SOLN
INTRAVENOUS | Status: AC
  Administered 2022-10-21: 500 [IU]
  Filled 2022-10-21: qty 5

## 2022-10-21 NOTE — Assessment & Plan Note (Addendum)
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 On avastin + lynparza-STABLE.  CT AP SEP 5th, 2023-  NED.STABLE.  #  PROCEED WITH Avastin treatment today [see discussion below].  Labs reviewed.;  CBC- platelets >100; CMP are reviewed; adequate today. Continue Lynparza 200 mg twice a day [severe anemia while on 250 mg twice a day].  Ca1 2 5 -normal limits BUT RISING- AUG, 2023- 18. Reviewed the CT scan SEP 2023-NEG for any recurrent cancer.  Understands patient currently has biochemical recurrence.  Will repeat CT scan in dec 2023.   # Abdominal hernia-CT scan sep- ventral hernia noted; no obstruction.  October appt with surgeon at [facility]th. Ancil Boozer will have to hold avastin 4-6 weeks prior and after.   # Hypertension: Blood pressure 112/80s-   Monitor closely on Avastin.   Continue metoprolol/clonidine hot flashes. STABLE.  # PN G-2/back pain [awaiting steroid injection]- on Lyrica 50 mg TID-STABLE.  # Nausea-sec to [employer]; worsened by semaglutide.  Continue Zofran/ compazine prn monitor for now.   # Anxiety-social stressors-continue Wellbutrin Cymbalta as per PCP.  Add Ativan 0.5 mg every 12 hours as needed.  Understands this is a short-term prescription.  #IV access/Mediport-currently s/p TPA- port flush  # DISPOSITION: schedule 10-11 am appt #  avastin today- ok with out urine protein # in 3 weeks - MD;   Avastin; port/labs-  cbc/cmp; ca-125; urine random protein:creatiine ratio; Dr.B
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
## 2022-10-21 NOTE — Progress Notes (Signed)
Patient here for oncology follow-up appointment, expresses no new concerns at this time.

## 2022-10-24 ENCOUNTER — Encounter: Payer: Self-pay | Admitting: Internal Medicine

## 2022-10-24 ENCOUNTER — Ambulatory Visit
Admission: RE | Admit: 2022-10-24 | Discharge: 2022-10-24 | Disposition: A | Discharge status: Home / Self Care | Payer: Medicare Other | Source: Ambulatory Visit | Attending: Family Medicine | Admitting: Family Medicine

## 2022-10-24 ENCOUNTER — Encounter (INDEPENDENT_AMBULATORY_CARE_PROVIDER_SITE_OTHER): Payer: Self-pay

## 2022-10-24 DIAGNOSIS — Z1509 Genetic susceptibility to other malignant neoplasm: Secondary | ICD-10-CM | POA: Diagnosis not present

## 2022-10-24 DIAGNOSIS — R92323 Mammographic fibroglandular density, bilateral breasts: Secondary | ICD-10-CM | POA: Diagnosis not present

## 2022-10-24 DIAGNOSIS — Z1501 Genetic susceptibility to malignant neoplasm of breast: Secondary | ICD-10-CM | POA: Diagnosis not present

## 2022-10-24 MED ORDER — GADOBUTROL 1 MMOL/ML IV SOLN
10.0000 mL | Freq: Once | INTRAVENOUS | Status: AC | PRN
  Administered 2022-10-24: 10 mL via INTRAVENOUS

## 2022-10-27 ENCOUNTER — Telehealth: Payer: Self-pay | Admitting: *Deleted

## 2022-10-27 ENCOUNTER — Encounter: Payer: Self-pay | Admitting: *Deleted

## 2022-10-27 NOTE — Patient Outreach (Signed)
  Care Coordination   10/27/2022 Name: ANAYANSI RUNDQUIST MRN: [id number] DOB: 09/22/1971   Care Coordination Outreach Attempts:  An unsuccessful telephone outreach was attempted for a scheduled appointment today.  Follow Up Plan:  Additional outreach attempts will be made to offer the patient care coordination information and services.   Encounter Outcome:  No Answer  Care Coordination Interventions Activated:  No   Care Coordination Interventions:  No, not indicated    Farley Crooker, [place] Worker  [facility] Care Management [phone]

## 2022-10-28 ENCOUNTER — Telehealth: Payer: Self-pay | Admitting: Internal Medicine

## 2022-10-28 ENCOUNTER — Other Ambulatory Visit: Payer: Self-pay

## 2022-10-28 DIAGNOSIS — R928 Other abnormal and inconclusive findings on diagnostic imaging of breast: Secondary | ICD-10-CM

## 2022-10-28 DIAGNOSIS — Z1501 Genetic susceptibility to malignant neoplasm of breast: Secondary | ICD-10-CM

## 2022-10-28 NOTE — Telephone Encounter (Signed)
LVM notifying pt of adjustments to appts on 11/17

## 2022-11-02 ENCOUNTER — Encounter: Payer: Self-pay | Admitting: Hospice and Palliative Medicine

## 2022-11-02 ENCOUNTER — Encounter: Payer: Self-pay | Admitting: Family Medicine

## 2022-11-02 ENCOUNTER — Other Ambulatory Visit: Payer: Self-pay | Admitting: Family Medicine

## 2022-11-02 MED ORDER — TIRZEPATIDE 5 MG/0.5ML ~~LOC~~ SOAJ: 5.0000 mg | SUBCUTANEOUS | 0 refills | Status: DC

## 2022-11-03 ENCOUNTER — Other Ambulatory Visit: Payer: Self-pay | Admitting: *Deleted

## 2022-11-03 DIAGNOSIS — F5105 Insomnia due to other mental disorder: Secondary | ICD-10-CM

## 2022-11-03 MED ORDER — QUETIAPINE FUMARATE 25 MG PO TABS: 25.0000 mg | ORAL_TABLET | Freq: Every day | ORAL | 2 refills | Status: DC

## 2022-11-03 MED ORDER — OXYCODONE HCL 5 MG PO TABS: 2.5000 mg | ORAL_TABLET | Freq: Four times a day (QID) | ORAL | 0 refills | Status: DC | PRN

## 2022-11-03 NOTE — Telephone Encounter (Signed)
Patient sent mychart msg to request RF on oxycodone and seroquel (for her insomnia). I reviewed chart. We have never prescribed the seroquel. Her pcp did this in 2021 as well as Dr. Leafy Ro in gyn.  Please advise-

## 2022-11-10 ENCOUNTER — Other Ambulatory Visit: Payer: Self-pay | Admitting: Internal Medicine

## 2022-11-11 ENCOUNTER — Inpatient Hospital Stay: Payer: Medicare Other | Admitting: Internal Medicine

## 2022-11-11 ENCOUNTER — Ambulatory Visit: Payer: Medicare Other | Admitting: Internal Medicine

## 2022-11-11 ENCOUNTER — Other Ambulatory Visit: Payer: Medicare Other

## 2022-11-11 ENCOUNTER — Ambulatory Visit: Payer: Medicare Other

## 2022-11-11 ENCOUNTER — Inpatient Hospital Stay: Payer: Medicare Other

## 2022-11-16 ENCOUNTER — Inpatient Hospital Stay: Payer: Medicare Other | Admitting: Internal Medicine

## 2022-11-16 ENCOUNTER — Telehealth: Payer: Self-pay | Admitting: *Deleted

## 2022-11-16 ENCOUNTER — Inpatient Hospital Stay: Payer: Medicare Other

## 2022-11-16 ENCOUNTER — Telehealth: Payer: Self-pay | Admitting: Internal Medicine

## 2022-11-16 ENCOUNTER — Inpatient Hospital Stay: Payer: Medicare Other | Attending: Internal Medicine

## 2022-11-16 NOTE — Telephone Encounter (Signed)
Left vm for patient regarding missed appt for this morning. Left vm requesting patient call back to reschedule

## 2022-11-16 NOTE — Telephone Encounter (Signed)
Patient called and states she missed her treatment appointment today due to traveling with her daughter- she would like to reschedule to next available date. She states her treatment shouldn't be 5 hours any longer so I need to know how long it will be so I can see when we can get her in.   Please advise.  Thank you

## 2022-11-22 ENCOUNTER — Telehealth: Payer: Self-pay | Admitting: Oncology

## 2022-11-22 ENCOUNTER — Other Ambulatory Visit: Payer: Self-pay | Admitting: Internal Medicine

## 2022-11-22 DIAGNOSIS — C482 Malignant neoplasm of peritoneum, unspecified: Secondary | ICD-10-CM

## 2022-11-22 DIAGNOSIS — Z7189 Other specified counseling: Secondary | ICD-10-CM

## 2022-11-22 NOTE — Telephone Encounter (Signed)
Patient called to report feeling bad due to severe lower abdomen cramping, urinating very little.  She denies any burning sensation, nausea vomiting or fever.  She was with her daughter who was seen in the emergency room this morning till 3 AM.  Her symptoms started 4 AM.  She has not been eating or drinking. I recommend patient to go to emergency room for evaluation of possible dehydration and infection.  Patient is reluctant.  I recommend patient to try her pain medication, increase oral hydration and see if the symptom improves.   Cc Dr. Rogue Bussing to follow-up a.m.

## 2022-11-23 NOTE — Telephone Encounter (Signed)
Amy could you call patient to see if she can come around 115 or 130 or port labs smc/ possible fluids. We can add on to Josh's sch. Pamala Hurry- can you assist in getting pt on sch if apts are confirmed.

## 2022-11-23 NOTE — Telephone Encounter (Signed)
Called and left detailed message advising patient to call back if she is able to make a 1:30 appt with [facility].

## 2022-11-25 ENCOUNTER — Encounter: Payer: Self-pay | Admitting: Internal Medicine

## 2022-11-30 ENCOUNTER — Inpatient Hospital Stay: Payer: Medicare Other

## 2022-11-30 ENCOUNTER — Inpatient Hospital Stay (HOSPITAL_BASED_OUTPATIENT_CLINIC_OR_DEPARTMENT_OTHER): Payer: Medicare Other | Admitting: Internal Medicine

## 2022-11-30 ENCOUNTER — Inpatient Hospital Stay: Payer: Medicare Other | Attending: Internal Medicine

## 2022-11-30 ENCOUNTER — Encounter: Payer: Self-pay | Admitting: Internal Medicine

## 2022-11-30 VITALS — BP 131/87 | HR 79 | Temp 96.2°F | Resp 18 | Ht 63.0 in | Wt 221.4 lb

## 2022-11-30 DIAGNOSIS — Z7189 Other specified counseling: Secondary | ICD-10-CM

## 2022-11-30 DIAGNOSIS — C482 Malignant neoplasm of peritoneum, unspecified: Secondary | ICD-10-CM

## 2022-11-30 DIAGNOSIS — Z5112 Encounter for antineoplastic immunotherapy: Secondary | ICD-10-CM | POA: Insufficient documentation

## 2022-11-30 DIAGNOSIS — Z79899 Other long term (current) drug therapy: Secondary | ICD-10-CM | POA: Diagnosis not present

## 2022-11-30 LAB — CBC WITH DIFFERENTIAL/PLATELET
Abs Immature Granulocytes: 0.02 10*3/uL (ref 0.00–0.07)
Basophils Absolute: 0.1 10*3/uL (ref 0.0–0.1)
Basophils Relative: 1 %
Eosinophils Absolute: 0.1 10*3/uL (ref 0.0–0.5)
Eosinophils Relative: 2 %
HCT: 45.6 % (ref 36.0–46.0)
Hemoglobin: 15.6 g/dL — ABNORMAL HIGH (ref 12.0–15.0)
Immature Granulocytes: 1 %
Lymphocytes Relative: 37 %
Lymphs Abs: 1.4 10*3/uL (ref 0.7–4.0)
MCH: 38.6 pg — ABNORMAL HIGH (ref 26.0–34.0)
MCHC: 34.2 g/dL (ref 30.0–36.0)
MCV: 112.9 fL — ABNORMAL HIGH (ref 80.0–100.0)
Monocytes Absolute: 0.3 10*3/uL (ref 0.1–1.0)
Monocytes Relative: 8 %
Neutro Abs: 1.9 10*3/uL (ref 1.7–7.7)
Neutrophils Relative %: 51 %
Platelets: 131 10*3/uL — ABNORMAL LOW (ref 150–400)
RBC: 4.04 MIL/uL (ref 3.87–5.11)
RDW: 14.7 % (ref 11.5–15.5)
WBC: 3.8 10*3/uL — ABNORMAL LOW (ref 4.0–10.5)
nRBC: 0 % (ref 0.0–0.2)

## 2022-11-30 LAB — URINALYSIS, DIPSTICK ONLY
Bilirubin Urine: NEGATIVE
Glucose, UA: NEGATIVE mg/dL
Hgb urine dipstick: NEGATIVE
Ketones, ur: 5 mg/dL — AB
Nitrite: NEGATIVE
Protein, ur: 30 mg/dL — AB
Specific Gravity, Urine: 1.021 (ref 1.005–1.030)
pH: 5 (ref 5.0–8.0)

## 2022-11-30 LAB — COMPREHENSIVE METABOLIC PANEL
ALT: 15 U/L (ref 0–44)
AST: 27 U/L (ref 15–41)
Albumin: 4.1 g/dL (ref 3.5–5.0)
Alkaline Phosphatase: 47 U/L (ref 38–126)
Anion gap: 10 (ref 5–15)
BUN: 10 mg/dL (ref 6–20)
CO2: 24 mmol/L (ref 22–32)
Calcium: 9.3 mg/dL (ref 8.9–10.3)
Chloride: 105 mmol/L (ref 98–111)
Creatinine, Ser: 0.83 mg/dL (ref 0.44–1.00)
GFR, Estimated: >60 mL/min (ref >60)
Glucose, Bld: 168 mg/dL — ABNORMAL HIGH (ref 70–99)
Potassium: 3.2 mmol/L — ABNORMAL LOW (ref 3.5–5.1)
Sodium: 139 mmol/L (ref 135–145)
Total Bilirubin: 0.9 mg/dL (ref 0.3–1.2)
Total Protein: 7.3 g/dL (ref 6.5–8.1)

## 2022-11-30 MED ORDER — ONDANSETRON HCL 4 MG/2ML IJ SOLN
4.0000 mg | Freq: Once | INTRAMUSCULAR | Status: AC
  Administered 2022-11-30: 4 mg via INTRAVENOUS
  Filled 2022-11-30: qty 2

## 2022-11-30 MED ORDER — TRAZODONE HCL 100 MG PO TABS: 100.0000 mg | ORAL_TABLET | Freq: Every evening | ORAL | 3 refills | Status: DC | PRN

## 2022-11-30 MED ORDER — SODIUM CHLORIDE 0.9 % IV SOLN
Freq: Once | INTRAVENOUS | Status: AC
  Filled 2022-11-30: qty 250

## 2022-11-30 MED ORDER — SODIUM CHLORIDE 0.9 % IV SOLN
15.0000 mg/kg | Freq: Once | INTRAVENOUS | Status: AC
  Administered 2022-11-30: 1500 mg via INTRAVENOUS
  Filled 2022-11-30: qty 48

## 2022-11-30 MED ORDER — HEPARIN SOD (PORK) LOCK FLUSH 100 UNIT/ML IV SOLN
500.0000 [IU] | Freq: Once | INTRAVENOUS | Status: AC | PRN
  Administered 2022-11-30: 500 [IU]
  Filled 2022-11-30: qty 5

## 2022-11-30 NOTE — Assessment & Plan Note (Addendum)
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 On avastin + lynparza-STABLE.  CT AP SEP 5th, 2023-  NED.STABLE.  #  PROCEED WITH Avastin treatment today [see discussion below].  Labs reviewed.;  CBC- platelets >100; CMP are reviewed; adequate today. Continue Lynparza 200 mg twice a day [severe anemia while on 250 mg twice a day].  Ca1 2 5 -normal limits BUT RISING- AUG, 2023- 18. Reviewed the CT scan SEP 2023-NEG for any recurrent cancer.  Understands patient currently has biochemical recurrence.  Will repeat CT scan in JAN 2024  # Abdominal hernia-CT scan sep- ventral hernia noted; no obstruction.  JAN 2024 appt with surgeon at [facility] . # Weight loss/obesity- on Monjauro- losing weight. Awiating re- eval with Surgeon in Jan 2023.  undesand will have to hold avastin 4-6 weeks prior and after.   # Hypertension: Blood pressure 112/80s-   Monitor closely on Avastin.   Continue metoprolol/clonidine hot flashes. STABLE.  # Abdominal discomfort/diarrhea-acute gastroenteritis-currently resolved.  Monitor  # PN G-2/back pain [awaiting steroid injection]- on Lyrica 50 mg TID/CymbaltaSTABLE.  # Nausea-sec to [place]  Continue Zofran/ compazine prn monitor for now.   # Anxiety/Insomnia--social stressors-continue Cymbalta [at 20 mg/day]; STOP SEROQUEL. Start Trazadone 100 mg qhs.   #IV access/Mediport-currently s/p TPA- port flush  # DISPOSITION: schedule 10-11 am appt #  avastin today- ok with out urine protein # in 3 weeks - MD;   Avastin; port/labs-  cbc/cmp; ca-125; urine random protein:creatiine ratio; Dr.B

## 2022-11-30 NOTE — Progress Notes (Signed)
Patient has difficulty sleeping at night and was prescribed Seroquel 25 mg by Josh B.  The prescribed dose was not helping so she increased Seroquel to 50mg  QHS.  Insomnia medication regimen is now Lunesta 3 mg to help her fall asleep and Seroquel 50 mg to help her stay asleep.  Requesting new rx for Seroquel 50 mg QHS.
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
## 2022-12-01 ENCOUNTER — Other Ambulatory Visit: Payer: Self-pay | Admitting: Nurse Practitioner

## 2022-12-02 ENCOUNTER — Encounter: Payer: Self-pay | Admitting: Internal Medicine

## 2022-12-03 LAB — CA 125: Cancer Antigen (CA) 125: 49.6 U/mL — ABNORMAL HIGH (ref 0.0–38.1)

## 2022-12-04 ENCOUNTER — Other Ambulatory Visit: Payer: Self-pay | Admitting: Nurse Practitioner

## 2022-12-04 DIAGNOSIS — C482 Malignant neoplasm of peritoneum, unspecified: Secondary | ICD-10-CM

## 2022-12-04 DIAGNOSIS — R971 Elevated cancer antigen 125 [CA 125]: Secondary | ICD-10-CM

## 2022-12-04 DIAGNOSIS — E8941 Symptomatic postprocedural ovarian failure: Secondary | ICD-10-CM

## 2022-12-10 ENCOUNTER — Encounter: Payer: Self-pay | Admitting: Nurse Practitioner

## 2022-12-12 ENCOUNTER — Other Ambulatory Visit: Payer: Self-pay | Admitting: Nurse Practitioner

## 2022-12-12 ENCOUNTER — Telehealth: Payer: Self-pay

## 2022-12-12 ENCOUNTER — Inpatient Hospital Stay: Payer: Medicare Other | Admitting: Licensed Clinical Social Worker

## 2022-12-12 ENCOUNTER — Inpatient Hospital Stay (HOSPITAL_BASED_OUTPATIENT_CLINIC_OR_DEPARTMENT_OTHER): Payer: Medicare Other | Admitting: Hospice and Palliative Medicine

## 2022-12-12 ENCOUNTER — Encounter: Payer: Self-pay | Admitting: Internal Medicine

## 2022-12-12 DIAGNOSIS — C482 Malignant neoplasm of peritoneum, unspecified: Secondary | ICD-10-CM | POA: Diagnosis not present

## 2022-12-12 DIAGNOSIS — G893 Neoplasm related pain (acute) (chronic): Secondary | ICD-10-CM | POA: Diagnosis not present

## 2022-12-12 DIAGNOSIS — C801 Malignant (primary) neoplasm, unspecified: Secondary | ICD-10-CM

## 2022-12-12 DIAGNOSIS — Z515 Encounter for palliative care: Secondary | ICD-10-CM | POA: Diagnosis not present

## 2022-12-12 MED ORDER — OXYCODONE HCL 5 MG PO TABS: 5.0000 mg | ORAL_TABLET | Freq: Four times a day (QID) | ORAL | 0 refills | Status: DC | PRN

## 2022-12-12 MED ORDER — CLONIDINE HCL 0.1 MG PO TABS: 0.1000 mg | ORAL_TABLET | Freq: Every evening | ORAL | 3 refills | Status: DC

## 2022-12-12 NOTE — Telephone Encounter (Signed)
Called and notified [name] with rise in CA 125. CT scan has been ordered and she will see Dr. Rogue Bussing as already scheduled for results.

## 2022-12-12 NOTE — Progress Notes (Signed)
Virtual Visit via Telephone Note  I connected with [name] on 12/12/22 at  1:20 PM EST by telephone and verified that I am speaking with the correct person using two identifiers.  Location: Patient: Home Provider: Clinic   I discussed the limitations, risks, security and privacy concerns of performing an evaluation and management service by telephone and the availability of in person appointments. I also discussed with the patient that there may be a patient responsible charge related to this service. The patient expressed understanding and agreed to proceed.   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  Patient is also status post neoadjuvant carbo/Taxol on maintenance Avastin/Lynparza.  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  Patient has also had chronic abdominal pain.  She was referred to palliative care to help address goals and manage ongoing symptoms.   Observations/Objective: Routine follow-up.    I spoke with patient by phone.  Patient reports that she is doing okay symptomatically.  Insomnia is improved on trazodone.  Patient is no longer taking Lunesta or Seroquel.  Pain is reportedly stable as long as she takes oxycodone, which he averages 2-3 a day.  Will refill this per patient request.  PDMP reviewed.  Patient denies other distressing symptoms.  Patient says that she is having some anxiety related to housing instability.  Her landlord has raised rent several times and patient can no longer afford to pay rent.  She says that she will likely be homeless starting in January with  plans to live out of her car.  Referral sent to social work to explore housing options.  Assessment and Plan: Stage IV serous adenocarcinoma -on systemic chemotherapy.  Slowly uptrending CA 125. Followed by Dr. Rogue Bussing who sees patient   Neoplasm related pain-continue oxycodone.   Insomnia -improved on trazodone.  [place] instability -referral to social work  Follow Up Instructions: Follow-up telephone visit 1-2 months   I discussed the assessment and treatment plan with the patient. The patient was provided an opportunity to ask questions and all were answered. The patient agreed with the plan and demonstrated an understanding of the instructions.   The patient was advised to call back or seek an in-person evaluation if the symptoms worsen or if the condition fails to improve as anticipated.  I provided 10 minutes of non-face-to-face time during this encounter.   Irean Hong, NP

## 2022-12-12 NOTE — Progress Notes (Signed)
[facility] CSW Progress Note  Clinical Education officer, museum contacted patient by phone to current housing situation.  Patient stated landlord had raised the rent and she will not be able to afford it.  Patient confirmed she doe snot have a lease. CSW recommended patient apply at the housing authority and updated on limited resources in the area.  Patient is currently living with 50 y/o daughter and also has a dog which she is willing to take to a shelter. CSW contacted [facility] [phone] and spoke with Geni Bers, who stated they have a year's long waiting list for permanent housing, to meet criteria the person must receive disability and be experiencing homelessness. CSW updated patient, and will continue to assist with finding resources.    Adelene Amas, LCSW

## 2022-12-13 ENCOUNTER — Ambulatory Visit
Admission: RE | Admit: 2022-12-13 | Discharge: 2022-12-13 | Disposition: A | Discharge status: Home / Self Care | Payer: Medicare Other | Source: Ambulatory Visit | Attending: Nurse Practitioner | Admitting: Nurse Practitioner

## 2022-12-13 DIAGNOSIS — R188 Other ascites: Secondary | ICD-10-CM | POA: Diagnosis not present

## 2022-12-13 DIAGNOSIS — K5730 Diverticulosis of large intestine without perforation or abscess without bleeding: Secondary | ICD-10-CM | POA: Diagnosis not present

## 2022-12-13 DIAGNOSIS — R971 Elevated cancer antigen 125 [CA 125]: Secondary | ICD-10-CM | POA: Insufficient documentation

## 2022-12-13 DIAGNOSIS — C482 Malignant neoplasm of peritoneum, unspecified: Secondary | ICD-10-CM | POA: Diagnosis not present

## 2022-12-13 MED ORDER — HEPARIN SOD (PORK) LOCK FLUSH 100 UNIT/ML IV SOLN
500.0000 [IU] | Freq: Once | INTRAVENOUS | Status: AC
  Administered 2022-12-13: 500 [IU] via INTRAVENOUS

## 2022-12-13 MED ORDER — IOHEXOL 300 MG/ML  SOLN
100.0000 mL | Freq: Once | INTRAMUSCULAR | Status: AC | PRN
  Administered 2022-12-13: 100 mL via INTRAVENOUS

## 2022-12-14 ENCOUNTER — Telehealth: Payer: Self-pay | Admitting: Nurse Practitioner

## 2022-12-14 ENCOUNTER — Encounter: Payer: Self-pay | Admitting: Internal Medicine

## 2022-12-14 DIAGNOSIS — Z59811 Housing instability, housed, with risk of homelessness: Secondary | ICD-10-CM

## 2022-12-14 DIAGNOSIS — C482 Malignant neoplasm of peritoneum, unspecified: Secondary | ICD-10-CM

## 2022-12-14 NOTE — Telephone Encounter (Signed)
Called patient. We are more than happy to provide her with any documentation that she needs to help assist her housing situation. She continues active treatment for her malignancy.

## 2022-12-15 ENCOUNTER — Encounter: Payer: Self-pay | Admitting: Nurse Practitioner

## 2022-12-20 ENCOUNTER — Inpatient Hospital Stay (HOSPITAL_BASED_OUTPATIENT_CLINIC_OR_DEPARTMENT_OTHER): Payer: Medicare Other | Admitting: Internal Medicine

## 2022-12-20 ENCOUNTER — Inpatient Hospital Stay: Payer: Medicare Other

## 2022-12-20 ENCOUNTER — Ambulatory Visit: Payer: Medicare Other

## 2022-12-20 ENCOUNTER — Encounter: Payer: Self-pay | Admitting: Internal Medicine

## 2022-12-20 VITALS — BP 122/90 | HR 69 | Temp 96.8°F | Resp 16 | Wt 215.4 lb

## 2022-12-20 VITALS — BP 109/78

## 2022-12-20 DIAGNOSIS — Z7189 Other specified counseling: Secondary | ICD-10-CM

## 2022-12-20 DIAGNOSIS — C482 Malignant neoplasm of peritoneum, unspecified: Secondary | ICD-10-CM | POA: Diagnosis not present

## 2022-12-20 DIAGNOSIS — Z5112 Encounter for antineoplastic immunotherapy: Secondary | ICD-10-CM | POA: Diagnosis not present

## 2022-12-20 DIAGNOSIS — Z79899 Other long term (current) drug therapy: Secondary | ICD-10-CM | POA: Diagnosis not present

## 2022-12-20 LAB — COMPREHENSIVE METABOLIC PANEL
ALT: 12 U/L (ref 0–44)
AST: 22 U/L (ref 15–41)
Albumin: 4.3 g/dL (ref 3.5–5.0)
Alkaline Phosphatase: 46 U/L (ref 38–126)
Anion gap: 6 (ref 5–15)
BUN: 10 mg/dL (ref 6–20)
CO2: 24 mmol/L (ref 22–32)
Calcium: 9.1 mg/dL (ref 8.9–10.3)
Chloride: 107 mmol/L (ref 98–111)
Creatinine, Ser: 0.74 mg/dL (ref 0.44–1.00)
GFR, Estimated: >60 mL/min (ref >60)
Glucose, Bld: 117 mg/dL — ABNORMAL HIGH (ref 70–99)
Potassium: 3.7 mmol/L (ref 3.5–5.1)
Sodium: 137 mmol/L (ref 135–145)
Total Bilirubin: 0.7 mg/dL (ref 0.3–1.2)
Total Protein: 7.4 g/dL (ref 6.5–8.1)

## 2022-12-20 LAB — CBC WITH DIFFERENTIAL/PLATELET
Abs Immature Granulocytes: 0.02 10*3/uL (ref 0.00–0.07)
Basophils Absolute: 0 10*3/uL (ref 0.0–0.1)
Basophils Relative: 1 %
Eosinophils Absolute: 0.1 10*3/uL (ref 0.0–0.5)
Eosinophils Relative: 1 %
HCT: 44.4 % (ref 36.0–46.0)
Hemoglobin: 15.3 g/dL — ABNORMAL HIGH (ref 12.0–15.0)
Immature Granulocytes: 1 %
Lymphocytes Relative: 34 %
Lymphs Abs: 1.5 10*3/uL (ref 0.7–4.0)
MCH: 38.1 pg — ABNORMAL HIGH (ref 26.0–34.0)
MCHC: 34.5 g/dL (ref 30.0–36.0)
MCV: 110.4 fL — ABNORMAL HIGH (ref 80.0–100.0)
Monocytes Absolute: 0.3 10*3/uL (ref 0.1–1.0)
Monocytes Relative: 8 %
Neutro Abs: 2.4 10*3/uL (ref 1.7–7.7)
Neutrophils Relative %: 55 %
Platelets: 125 10*3/uL — ABNORMAL LOW (ref 150–400)
RBC: 4.02 MIL/uL (ref 3.87–5.11)
RDW: 14.6 % (ref 11.5–15.5)
WBC: 4.3 10*3/uL (ref 4.0–10.5)
nRBC: 0 % (ref 0.0–0.2)

## 2022-12-20 LAB — URINALYSIS, DIPSTICK ONLY
Bilirubin Urine: NEGATIVE
Glucose, UA: NEGATIVE mg/dL
Hgb urine dipstick: NEGATIVE
Ketones, ur: 5 mg/dL — AB
Nitrite: NEGATIVE
Protein, ur: 100 mg/dL — AB
Specific Gravity, Urine: 1.03 (ref 1.005–1.030)
pH: 5 (ref 5.0–8.0)

## 2022-12-20 MED ORDER — ONDANSETRON HCL 4 MG/2ML IJ SOLN
4.0000 mg | Freq: Once | INTRAMUSCULAR | Status: AC
  Administered 2022-12-20: 4 mg via INTRAVENOUS
  Filled 2022-12-20: qty 2

## 2022-12-20 MED ORDER — SODIUM CHLORIDE 0.9% FLUSH
10.0000 mL | INTRAVENOUS | Status: DC | PRN
  Administered 2022-12-20: 10 mL
  Filled 2022-12-20: qty 10

## 2022-12-20 MED ORDER — SODIUM CHLORIDE 0.9 % IV SOLN
Freq: Once | INTRAVENOUS | Status: AC
  Filled 2022-12-20: qty 250

## 2022-12-20 MED ORDER — SODIUM CHLORIDE 0.9 % IV SOLN
15.0000 mg/kg | Freq: Once | INTRAVENOUS | Status: AC
  Administered 2022-12-20: 1500 mg via INTRAVENOUS
  Filled 2022-12-20: qty 12

## 2022-12-20 MED ORDER — SODIUM CHLORIDE 0.9 % IV SOLN: 15.0000 mg/kg | Freq: Once | INTRAVENOUS | Status: DC

## 2022-12-20 MED ORDER — HEPARIN SOD (PORK) LOCK FLUSH 100 UNIT/ML IV SOLN
500.0000 [IU] | Freq: Once | INTRAVENOUS | Status: AC | PRN
  Administered 2022-12-20: 500 [IU]
  Filled 2022-12-20: qty 5

## 2022-12-20 NOTE — Patient Instructions (Signed)
[facility] CTR AT [place]  Discharge Instructions: Thank you for choosing [facility] to provide your oncology and hematology care.  If you have a lab appointment with the [facility], please go directly to the [facility] and check in at the registration area.  Wear comfortable clothing and clothing appropriate for easy access to any Portacath or PICC line.   We strive to give you quality time with your provider. You may need to reschedule your appointment if you arrive late (15 or more minutes).  Arriving late affects you and other patients whose appointments are after yours.  Also, if you miss three or more appointments without notifying the office, you may be dismissed from the clinic at the provider's discretion.      For prescription refill requests, have your pharmacy contact our office and allow 72 hours for refills to be completed.    Today you received the following chemotherapy and/or immunotherapy agents Avastin.      To help prevent nausea and vomiting after your treatment, we encourage you to take your nausea medication as directed.  BELOW ARE SYMPTOMS THAT SHOULD BE REPORTED IMMEDIATELY: *FEVER GREATER THAN 100.4 F (38 C) OR HIGHER *CHILLS OR SWEATING *NAUSEA AND VOMITING THAT IS NOT CONTROLLED WITH YOUR NAUSEA MEDICATION *UNUSUAL SHORTNESS OF BREATH *UNUSUAL BRUISING OR BLEEDING *URINARY PROBLEMS (pain or burning when urinating, or frequent urination) *BOWEL PROBLEMS (unusual diarrhea, constipation, pain near the anus) TENDERNESS IN MOUTH AND THROAT WITH OR WITHOUT PRESENCE OF ULCERS (sore throat, sores in mouth, or a toothache) UNUSUAL RASH, SWELLING OR PAIN  UNUSUAL VAGINAL DISCHARGE OR ITCHING   Items with * indicate a potential emergency and should be followed up as soon as possible or go to the Emergency Department if any problems should occur.  Please show the CHEMOTHERAPY ALERT CARD or IMMUNOTHERAPY ALERT CARD at check-in to  the Emergency Department and triage nurse.  Should you have questions after your visit or need to cancel or reschedule your appointment, please contact [facility] CANCER [place] AT [place]  [phone] and follow the prompts.  Office hours are 8:00 a.m. to 4:30 p.m. Monday - Friday. Please note that voicemails left after 4:00 p.m. may not be returned until the following business day.  We are closed weekends and major holidays. You have access to a nurse at all times for urgent questions. Please call the main number to the clinic [phone] and follow the prompts.  For any non-urgent questions, you may also contact your provider using MyChart. We now offer e-Visits for anyone 29 and older to request care online for non-urgent symptoms. For details visit [email or web address].   Also download the MyChart app! Go to the app store, search "MyChart", open the app, select [facility], and log in with your MyChart username and password.  Masks are optional in the cancer centers. If you would like for your care team to wear a mask while they are taking care of you, please let them know. For doctor visits, patients may have with them one support person who is at least 50 years old. At this time, visitors are not allowed in the infusion area.
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
## 2022-12-20 NOTE — Assessment & Plan Note (Addendum)
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 On avastin + lynparza-STABLE.  CT DEC 21st, 2023-CT CAP- No evidence of local recurrence or metastatic disease in the chest, abdomen, or pelvis.Understands patient currently has biochemical recurrence.     #  PROCEED WITH Avastin treatment today [see discussion below].  Labs reviewed.;  CBC- platelets >100; CMP are reviewed; adequate today. Continue Lynparza 200 mg twice a day [severe anemia while on 250 mg twice a day].  Ca1 2 5 -normal limits BUT RISING- AUG, 2023- 43.  # Abdominal hernia-CT scan  DEC 2023-  ventral hernia noted; no obstruction.  JAN 2024 appt with surgeon at [facility] . # Weight loss/obesity- on Monjauro- losing weight. Awiating re- eval with Surgeon in Jan 2023.  undesand will have to hold avastin 4-6 weeks prior and after.   # Hypertension: Blood pressure 112/80s-   Monitor closely on Avastin.   Continue metoprolol/clonidine hot flashes. STABLE.  # Abdominal discomfort/diarrhea-acute gastroenteritis-currently resolved.  Monitor  # PN G-2/back pain [awaiting steroid injection]- on Lyrica 50 mg TID/CymbaltaSTABLE.  # Nausea-sec to [place]  Continue Zofran/ compazine prn monitor for now.   # Anxiety/Insomnia--social stressors-continue Cymbalta [at 20 mg/day]; STOP SEROQUEL. Start Trazadone 100 mg qhs.   #IV access/Mediport-currently s/p TPA- port flush  # DISPOSITION:  # TODAY ORDER urine random protein:creatiine ratio; #  avastin today-  # in 3 weeks - MD;  Avastin; port/labs-  cbc/cmp; ca-125; urine random protein:creatiine ratio; Dr.B  # I reviewed the blood work- with the patient in detail; also reviewed the imaging independently [as summarized above]; and with the patient in detail.

## 2022-12-20 NOTE — Progress Notes (Signed)
Patient denies new problems/concerns today.   °

## 2022-12-22 LAB — CA 125: Cancer Antigen (CA) 125: 38.9 U/mL — ABNORMAL HIGH (ref 0.0–38.1)

## 2022-12-28 DIAGNOSIS — M47816 Spondylosis without myelopathy or radiculopathy, lumbar region: Secondary | ICD-10-CM | POA: Diagnosis not present

## 2022-12-28 NOTE — Progress Notes (Deleted)
Name: [name]   MRN: [id number]    DOB: August 13, 1971   Date:12/28/2022       Progress Note  Subjective  Chief Complaint  Follow Up  HPI  HTN/Tachycardia: currently on metoprolol  25 mg and heart rate is normal now, bp is at goal ,no chest pain or palpitation Unchange  Chronic back pain: still under the care of ortho and takes Lyrica and symptoms are under control now    Morbid obesity:she was diagnosed with carcinomatosis  in 11/2019 at a weight of 259 lbs. It went up to 271 lbs 08/2021, she was seen by [facility] August 2023 weight was 242 lbs. She has been eating healthier and started on Ozempic, still taking 0.25 mg weekly and has lost 9 lbs , today's weight is 234 lbs. She is happy with results, it curbs her appetite, we will increase dose to 0.5 mg and consider going up to 54m if tolerated.    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 SAlto Denver She is having some spotting, night sweats are severe, on Clonidine 0.3 mg patch and  clonidine 0.1 mg qhs , she states hot flashes has been under control now  She has neuropathy on both hands and feet , not causing falls or loss of  of balance. She takes Duloxetine and Lyrica and seems to be controlling symptoms of neuropathy, pain level is 2/10 Still under the care of oncologist    Insomnia: she is now taking Lunesta 2 mg , given by oncologist and is doing well, able to fall and stay asleep. Stable    Cardiomegaly: on CT scan, however normal echo   Smoker's cough: trying to quit now, she has symbicort at home and takes it prn, however states insurance no longer going to cover, advised her to contact insurance to find out what is available on her plan    Migraines: she stopped topamax, taking imitrex prn, she denies recent episodes of migraine . No recent episodes. She is having dull headaches, not migraines lately    Depression Major:  recurrent, going on for a long time, she is back on Duloxetine since diagnosed with cancer Dec 2020. Wellbutrin was added but is under more stress, ,moved out of her daughter's house but now she is struggling to pay bills, CA125 going up and worried about recurrent of cancer. Phq 9 is higher, she is willing to try a mood stabilizer.   Dyslipidemia:  The 10-year ASCVD risk score (Arnett DK, et al., 2019) is: 4.2%   Values used to calculate the score:     Age: [age]years     Sex: Female     Is Non-Hispanic African American: No     Diabetic: No     Tobacco smoker: Yes     Systolic Blood Pressure: 1468mmHg     Is BP treated: No     HDL Cholesterol: 58 mg/dL     Total Cholesterol: 249 mg/dL   Pre-diabetes: A1C improved , down to 5.5 %  she states currently not having  polyphagia, polydipsia and polyuria   Elevated TSH: last level at goal   Patient Active Problem List   Diagnosis Date Noted   DDD (degenerative disc disease), lumbar 09/28/2022   Intertrigo 103/21/2248  Metabolic syndrome 125/00/3704  Moderate episode of recurrent major depressive disorder ([place] 06/17/2022   Smokers' cough ([place]  05/17/2022   Dyslipidemia 05/17/2022   Elevated TSH 05/17/2022   Pre-diabetes 05/17/2022   Ventral hernia without obstruction or gangrene 04/26/2021   Malignant ascites 12/22/2019   Pleural effusion on right 12/22/2019   Tachycardia 12/22/2019   Primary peritoneal carcinomatosis ([place]) 12/16/2019   Goals of care, counseling/discussion 12/16/2019   Erythrocytosis 11/12/2019   Morbid obesity ([place]) 07/07/2018   BRCA1 gene mutation positive 06/18/2018   Fever blister 05/17/2018   Family history of breast cancer 05/08/2018   Family history of colon cancer in mother 05/08/2018   Migraine with aura and without status migrainosus 04/18/2018   Mild recurrent major depression ([place]) 01/20/2016   GERD without esophagitis 01/20/2016   Osteoarthritis, multiple sites 01/20/2016    Past Surgical History:   Procedure Laterality Date   ABDOMINAL HYSTERECTOMY  03/2020   APPENDECTOMY     LSC but "ruptured when they did the surgery"   BREAST BIOPSY Left 01/04/2021   MRI BX   CESAREAN SECTION     CYSTOSCOPY N/A 04/08/2020   Procedure: CYSTOSCOPY;  [name], MD;  [name];  Service: Gynecology;  [name];   INSERTION OF MESH N/A 04/26/2021   Procedure: INSERTION OF MESH;  [name], MD;  [name];  Service: General;  [name];   IR THORACENTESIS ASP PLEURAL SPACE W/IMG GUIDE  12/06/2019   IUD REMOVAL N/A 04/08/2020   Procedure: INTRAUTERINE DEVICE (IUD) REMOVAL;  [name], MD;  [name];  Service: Gynecology;  [name];   PARACENTESIS     x6   PORTA CATH INSERTION N/A 04/23/2020   Procedure: PORTA CATH INSERTION;  [name], MD;  Location: [facility];  Service: Cardiovascular;  [name];   TUBAL LIGATION     at time of CSxn   VENTRAL HERNIA REPAIR N/A 04/26/2021   Procedure: HERNIA REPAIR VENTRAL ADULT;  [name], MD;  [name];  Service: General;  [name];  need RNFA for the case   WRIST SURGERY Left 11/21/2016   plates and screws inserted    Family History  Adopted: Yes  Problem Relation Age of Onset   Lung cancer Father        deceased 69   Breast cancer Mother 32       currently 52   Colon cancer Mother    ADD / ADHD Son    ADD / ADHD Son    Early death Maternal Aunt    Breast cancer Maternal Aunt 20       deceased 20   Breast cancer Maternal Grandmother    Depression Daughter    Depression Daughter    Prostate cancer Paternal Uncle    Stroke Paternal Uncle    Leukemia Paternal Aunt    Breast cancer Paternal Grandmother    Cancer Maternal Uncle     Social History   Tobacco Use   Smoking status: Every Day    Packs/day: 1.00    Years: 30.00    Total pack years: 30.00    Types: Cigarettes    Smokeless tobacco: Never  Substance Use Topics   Alcohol use: Not Currently    Alcohol/week: 0.0 standard drinks of alcohol     Current Outpatient Medications:    ARIPiprazole (ABILIFY) 2 MG tablet, Take 1 tablet (2 mg total) by mouth daily., Disp: 90 tablet, Rfl: 0   buPROPion (WELLBUTRIN XL) 300 MG 24 hr tablet, Take 1  tablet (300 mg total) by mouth daily. (Patient not taking: Reported on 12/20/2022), Disp: 90 tablet, Rfl: 0   cloNIDine (CATAPRES - DOSED IN MG/24 HR) 0.3 mg/24hr patch, APPLY 1 PATCH(0.3 MG) TOPICALLY TO THE SKIN 1 TIME A WEEK, Disp: 12 patch, Rfl: 3   cloNIDine (CATAPRES) 0.1 MG tablet, Take 1 tablet (0.1 mg total) by mouth at bedtime., Disp: 90 tablet, Rfl: 3   cyclobenzaprine (FLEXERIL) 5 MG tablet, Take 5 mg by mouth 3 (three) times daily as needed., Disp: , Rfl:    DULoxetine (CYMBALTA) 60 MG capsule, Take 1 capsule (60 mg total) by mouth daily., Disp: 90 capsule, Rfl: 0   LORazepam (ATIVAN) 0.5 MG tablet, Take 1 tablet (0.5 mg total) by mouth every 12 (twelve) hours as needed for anxiety., Disp: 60 tablet, Rfl: 0   LYNPARZA 100 MG tablet, TAKE 2 TABLETS (200 MG) TWICE A DAY MAY TAKE WITH FOOD TO DECREASE NAUSEA AND VOMITING, Disp: 120 tablet, Rfl: 3   metoprolol succinate (TOPROL-XL) 25 MG 24 hr tablet, TAKE 1/2 TABLET(12.5 MG) BY MOUTH DAILY, Disp: 45 tablet, Rfl: 1   Multiple Vitamin (MULTIVITAMIN WITH MINERALS) TABS tablet, Take 1 tablet by mouth daily., Disp: , Rfl:    nystatin (MYCOSTATIN/NYSTOP) powder, Apply 1 Application topically 3 (three) times daily., Disp: 60 g, Rfl: 0   omeprazole (PRILOSEC) 20 MG capsule, TAKE 1 CAPSULE(20 MG) BY MOUTH DAILY, Disp: 90 capsule, Rfl: 2   ondansetron (ZOFRAN-ODT) 8 MG disintegrating tablet, Take 1 tablet (8 mg total) by mouth every 8 (eight) hours as needed for nausea or vomiting., Disp: 30 tablet, Rfl: 3   oxyCODONE (OXY IR/ROXICODONE) 5 MG immediate release tablet, Take 1 tablet (5 mg total) by mouth every 6 (six) hours as  needed for severe pain., Disp: 60 tablet, Rfl: 0   polyethylene glycol powder (GLYCOLAX/MIRALAX) 17 GM/SCOOP powder, Take 0.5 Containers by mouth daily as needed for mild constipation or moderate constipation., Disp: , Rfl:    pregabalin (LYRICA) 150 MG capsule, Take 150 mg by mouth 3 (three) times daily., Disp: , Rfl:    prochlorperazine (COMPAZINE) 10 MG tablet, Take 1 tablet (10 mg total) by mouth every 6 (six) hours as needed for nausea or vomiting., Disp: 40 tablet, Rfl: 3   senna (SENOKOT) 8.6 MG tablet, Take 1 tablet by mouth daily as needed for constipation., Disp: , Rfl:    SUMAtriptan (IMITREX) 100 MG tablet, Take 1 tablet (100 mg total) by mouth every 2 (two) hours as needed for migraine. May repeat in 2 hours if headache persists or recurs., Disp: 10 tablet, Rfl: 2   tirzepatide (MOUNJARO) 5 MG/0.5ML Pen, Inject 5 mg into the skin once a week., Disp: 6 mL, Rfl: 0   traZODone (DESYREL) 100 MG tablet, Take 1 tablet (100 mg total) by mouth at bedtime as needed for sleep., Disp: 30 tablet, Rfl: 3   valACYclovir (VALTREX) 1000 MG tablet, Take 1 tablet (1,000 mg total) by mouth 2 (two) times daily as needed., Disp: 6 tablet, Rfl: 2 No current facility-administered medications for this visit.  Facility-Administered Medications Ordered in Other Visits:    heparin lock flush 100 unit/mL, 250 Units, Intracatheter, Once PRN, Cammie Sickle, MD   ondansetron (ZOFRAN) 4 MG/2ML injection, , , ,    sodium chloride flush (NS) 0.9 % injection 10 mL, 10 mL, Intravenous, Once, Brahmanday, Govinda R, MD   sodium chloride flush (NS) 0.9 % injection 10 mL, 10 mL, Intracatheter, PRN, Cammie Sickle, MD  Allergies  Allergen Reactions   Hydroxyzine Hcl Other (See Comments)    Makes her loopy/jittery    I personally reviewed active problem list, medication list, allergies, family history, social history, health maintenance with the patient/caregiver today.   ROS  ***  Objective  There  were no vitals filed for this visit.  There is no height or weight on file to calculate BMI.  Physical Exam ***  Recent Results (from the past 2160 hour(s))  Urinalysis, dipstick only     Status: Abnormal   Collection Time: 10/21/22 10:08 AM  Result Value Ref Range   Color, Urine AMBER (A) YELLOW    Comment: BIOCHEMICALS MAY BE AFFECTED BY COLOR   APPearance CLOUDY (A) CLEAR   Specific Gravity, Urine 1.024 1.005 - 1.030   pH 5.0 5.0 - 8.0   Glucose, UA NEGATIVE NEGATIVE mg/dL   Hgb urine dipstick NEGATIVE NEGATIVE   Bilirubin Urine NEGATIVE NEGATIVE   Ketones, ur NEGATIVE NEGATIVE mg/dL   Protein, ur 30 (A) NEGATIVE mg/dL   Nitrite NEGATIVE NEGATIVE   Leukocytes,Ua TRACE (A) NEGATIVE    Comment: Performed at [facility], [address] [place], [place] [address]  Comprehensive metabolic panel     Status: Abnormal   Collection Time: 10/21/22 10:08 AM  Result Value Ref Range   Sodium 138 135 - 145 mmol/L   Potassium 3.9 3.5 - 5.1 mmol/L   Chloride 108 98 - 111 mmol/L   CO2 23 22 - 32 mmol/L   Glucose, Bld 102 (H) 70 - 99 mg/dL    Comment: Glucose reference range applies only to samples taken after fasting for at least 8 hours.   BUN 12 6 - 20 mg/dL   Creatinine, Ser 0.76 0.44 - 1.00 mg/dL   Calcium 9.1 8.9 - 10.3 mg/dL   Total Protein 7.2 6.5 - 8.1 g/dL   Albumin 4.1 3.5 - 5.0 g/dL   AST 24 15 - 41 U/L   ALT 18 0 - 44 U/L   Alkaline Phosphatase 53 38 - 126 U/L   Total Bilirubin 0.9 0.3 - 1.2 mg/dL   GFR, Estimated >60 >60 mL/min    Comment: (NOTE) Calculated using the CKD-EPI Creatinine Equation (2021)    Anion gap 7 5 - 15    Comment: Performed at [facility], [address] [place], [place] [address]  CBC with Differential     Status: Abnormal   Collection Time: 10/21/22 10:08 AM  Result Value Ref Range   WBC 4.7 4.0 - 10.5 K/uL   RBC 3.86 (L) 3.87 - 5.11 MIL/uL   Hemoglobin 15.2 (H) 12.0 - 15.0 g/dL   HCT 44.2 36.0 - 46.0 %   MCV 114.5 (H)  80.0 - 100.0 fL   MCH 39.4 (H) 26.0 - 34.0 pg   MCHC 34.4 30.0 - 36.0 g/dL   RDW 15.4 11.5 - 15.5 %   Platelets 130 (L) 150 - 400 K/uL   nRBC 0.0 0.0 - 0.2 %   Neutrophils Relative % 50 %   Neutro Abs 2.3 1.7 - 7.7 K/uL   Lymphocytes Relative 38 %   Lymphs Abs 1.8 0.7 - 4.0 K/uL   Monocytes Relative 9 %   Monocytes Absolute 0.4 0.1 - 1.0 K/uL   Eosinophils Relative 2 %   Eosinophils Absolute 0.1 0.0 - 0.5 K/uL   Basophils Relative 1 %   Basophils Absolute 0.1 0.0 - 0.1 K/uL   Immature Granulocytes 0 %   Abs Immature  Granulocytes 0.02 0.00 - 0.07 K/uL    Comment: Performed at [facility], [address] [place], [place] [address]  Urinalysis, dipstick only     Status: Abnormal   Collection Time: 11/30/22  9:18 AM  Result Value Ref Range   Color, Urine AMBER (A) YELLOW    Comment: BIOCHEMICALS MAY BE AFFECTED BY COLOR   APPearance CLOUDY (A) CLEAR   Specific Gravity, Urine 1.021 1.005 - 1.030   pH 5.0 5.0 - 8.0   Glucose, UA NEGATIVE NEGATIVE mg/dL   Hgb urine dipstick NEGATIVE NEGATIVE   Bilirubin Urine NEGATIVE NEGATIVE   Ketones, ur 5 (A) NEGATIVE mg/dL   Protein, ur 30 (A) NEGATIVE mg/dL   Nitrite NEGATIVE NEGATIVE   Leukocytes,Ua TRACE (A) NEGATIVE    Comment: Performed at [facility], [address] [place], [place] [address]  Comprehensive metabolic panel     Status: Abnormal   Collection Time: 11/30/22  9:18 AM  Result Value Ref Range   Sodium 139 135 - 145 mmol/L   Potassium 3.2 (L) 3.5 - 5.1 mmol/L   Chloride 105 98 - 111 mmol/L   CO2 24 22 - 32 mmol/L   Glucose, Bld 168 (H) 70 - 99 mg/dL    Comment: Glucose reference range applies only to samples taken after fasting for at least 8 hours.   BUN 10 6 - 20 mg/dL   Creatinine, Ser 0.83 0.44 - 1.00 mg/dL   Calcium 9.3 8.9 - 10.3 mg/dL   Total Protein 7.3 6.5 - 8.1 g/dL   Albumin 4.1 3.5 - 5.0 g/dL   AST 27 15 - 41 U/L   ALT 15 0 - 44 U/L   Alkaline Phosphatase 47 38 - 126 U/L   Total  Bilirubin 0.9 0.3 - 1.2 mg/dL   GFR, Estimated >60 >60 mL/min    Comment: (NOTE) Calculated using the CKD-EPI Creatinine Equation (2021)    Anion gap 10 5 - 15    Comment: Performed at [facility], [address] [place], [place] [address]  CBC with Differential     Status: Abnormal   Collection Time: 11/30/22  9:18 AM  Result Value Ref Range   WBC 3.8 (L) 4.0 - 10.5 K/uL   RBC 4.04 3.87 - 5.11 MIL/uL   Hemoglobin 15.6 (H) 12.0 - 15.0 g/dL   HCT 45.6 36.0 - 46.0 %   MCV 112.9 (H) 80.0 - 100.0 fL   MCH 38.6 (H) 26.0 - 34.0 pg   MCHC 34.2 30.0 - 36.0 g/dL   RDW 14.7 11.5 - 15.5 %   Platelets 131 (L) 150 - 400 K/uL    Comment: SPECIMEN CHECKED FOR CLOTS   nRBC 0.0 0.0 - 0.2 %   Neutrophils Relative % 51 %   Neutro Abs 1.9 1.7 - 7.7 K/uL   Lymphocytes Relative 37 %   Lymphs Abs 1.4 0.7 - 4.0 K/uL   Monocytes Relative 8 %   Monocytes Absolute 0.3 0.1 - 1.0 K/uL   Eosinophils Relative 2 %   Eosinophils Absolute 0.1 0.0 - 0.5 K/uL   Basophils Relative 1 %   Basophils Absolute 0.1 0.0 - 0.1 K/uL   Immature Granulocytes 1 %   Abs Immature Granulocytes 0.02 0.00 - 0.07 K/uL    Comment: Performed at [facility], [place], [place] [address]  CA 125     Status: Abnormal   Collection Time: 11/30/22  9:18 AM  Result Value Ref Range  Cancer Antigen (CA) 125 49.6 (H) 0.0 - 38.1 U/mL    Comment: (NOTE) Roche Diagnostics Electrochemiluminescence Immunoassay (ECLIA) Values obtained with different assay methods or kits cannot be used interchangeably.  Results cannot be interpreted as absolute evidence of the presence or absence of malignant disease. Performed At: [facility] [place], [place] [id number] Rush Farmer MD [id number]   Urinalysis, dipstick only     Status: Abnormal   Collection Time: 12/20/22  1:33 PM  Result Value Ref Range   Color, Urine AMBER (A) YELLOW    Comment: BIOCHEMICALS MAY BE AFFECTED BY COLOR    APPearance CLOUDY (A) CLEAR   Specific Gravity, Urine 1.030 1.005 - 1.030   pH 5.0 5.0 - 8.0   Glucose, UA NEGATIVE NEGATIVE mg/dL   Hgb urine dipstick NEGATIVE NEGATIVE   Bilirubin Urine NEGATIVE NEGATIVE   Ketones, ur 5 (A) NEGATIVE mg/dL   Protein, ur 100 (A) NEGATIVE mg/dL   Nitrite NEGATIVE NEGATIVE   Leukocytes,Ua TRACE (A) NEGATIVE    Comment: Performed at [facility], [address] [place], [place] [address]  Comprehensive metabolic panel     Status: Abnormal   Collection Time: 12/20/22  1:33 PM  Result Value Ref Range   Sodium 137 135 - 145 mmol/L   Potassium 3.7 3.5 - 5.1 mmol/L   Chloride 107 98 - 111 mmol/L   CO2 24 22 - 32 mmol/L   Glucose, Bld 117 (H) 70 - 99 mg/dL    Comment: Glucose reference range applies only to samples taken after fasting for at least 8 hours.   BUN 10 6 - 20 mg/dL   Creatinine, Ser 0.74 0.44 - 1.00 mg/dL   Calcium 9.1 8.9 - 10.3 mg/dL   Total Protein 7.4 6.5 - 8.1 g/dL   Albumin 4.3 3.5 - 5.0 g/dL   AST 22 15 - 41 U/L   ALT 12 0 - 44 U/L   Alkaline Phosphatase 46 38 - 126 U/L   Total Bilirubin 0.7 0.3 - 1.2 mg/dL   GFR, Estimated >60 >60 mL/min    Comment: (NOTE) Calculated using the CKD-EPI Creatinine Equation (2021)    Anion gap 6 5 - 15    Comment: Performed at [facility], [address] [place], [place] [address]  CBC with Differential     Status: Abnormal   Collection Time: 12/20/22  1:33 PM  Result Value Ref Range   WBC 4.3 4.0 - 10.5 K/uL   RBC 4.02 3.87 - 5.11 MIL/uL   Hemoglobin 15.3 (H) 12.0 - 15.0 g/dL   HCT 44.4 36.0 - 46.0 %   MCV 110.4 (H) 80.0 - 100.0 fL   MCH 38.1 (H) 26.0 - 34.0 pg   MCHC 34.5 30.0 - 36.0 g/dL   RDW 14.6 11.5 - 15.5 %   Platelets 125 (L) 150 - 400 K/uL   nRBC 0.0 0.0 - 0.2 %   Neutrophils Relative % 55 %   Neutro Abs 2.4 1.7 - 7.7 K/uL   Lymphocytes Relative 34 %   Lymphs Abs 1.5 0.7 - 4.0 K/uL   Monocytes Relative 8 %   Monocytes Absolute 0.3 0.1 - 1.0 K/uL    Eosinophils Relative 1 %   Eosinophils Absolute 0.1 0.0 - 0.5 K/uL   Basophils Relative 1 %   Basophils Absolute 0.0 0.0 - 0.1 K/uL   Immature Granulocytes 1 %   Abs Immature Granulocytes 0.02 0.00 - 0.07 K/uL    Comment: Performed at  [facility], [place], [place] [address]  CA 125     Status: Abnormal   Collection Time: 12/20/22  1:33 PM  Result Value Ref Range   Cancer Antigen (CA) 125 38.9 (H) 0.0 - 38.1 U/mL    Comment: (NOTE) Roche Diagnostics Electrochemiluminescence Immunoassay (ECLIA) Values obtained with different assay methods or kits cannot be used interchangeably.  Results cannot be interpreted as absolute evidence of the presence or absence of malignant disease. Performed At: [facility] [address] [place], [place] [id number] Rush Farmer MD [id number]     PHQ2/9:    12/12/2022    4:32 PM 09/28/2022   11:06 AM 09/28/2022   10:44 AM 08/05/2022    9:18 AM 06/17/2022    2:31 PM  Depression screen PHQ 2/9  Decreased Interest 0 _0 Down, Depressed, Hopeless _1 PHQ - 2 Score _2 Altered sleeping 0 _3 Tired, decreased energy _4 Change in appetite 0  0 0 2  Feeling bad or failure about yourself  3 3 0 0 1  Trouble concentrating 3 2 0 0 1  Moving slowly or fidgety/restless 0  0 0 0  Suicidal thoughts 0  0 0 0  PHQ-9 Score _5 Difficult doing work/chores Somewhat difficult   Somewhat difficult Somewhat difficult    phq 9 is {gen pos [id number]}   Fall Risk:    09/28/2022   10:57 AM 08/05/2022    9:18 AM 06/17/2022    2:31 PM 05/17/2022   11:24 AM 08/31/2021    1:43 PM  Fall Risk   Falls in the past year? 0 0 0 1 0  Number falls in past yr: 0 0 0 1 0  Injury with Fall? 0 0 0 0 0  Risk for fall due to : _6   Follow up Falls prevention discussed Falls prevention discussed;Education provided Falls prevention  discussed;Education provided Falls prevention discussed Falls prevention discussed      Functional Status Survey:      Assessment & Plan  *** There are no diagnoses linked to this encounter.

## 2022-12-29 ENCOUNTER — Ambulatory Visit: Payer: Medicare Other | Admitting: Family Medicine

## 2022-12-30 DIAGNOSIS — M75101 Unspecified rotator cuff tear or rupture of right shoulder, not specified as traumatic: Secondary | ICD-10-CM | POA: Diagnosis not present

## 2022-12-30 DIAGNOSIS — M542 Cervicalgia: Secondary | ICD-10-CM | POA: Diagnosis not present

## 2022-12-30 DIAGNOSIS — M5412 Radiculopathy, cervical region: Secondary | ICD-10-CM | POA: Diagnosis not present

## 2022-12-30 DIAGNOSIS — C569 Malignant neoplasm of unspecified ovary: Secondary | ICD-10-CM | POA: Diagnosis not present

## 2022-12-30 DIAGNOSIS — G5601 Carpal tunnel syndrome, right upper limb: Secondary | ICD-10-CM | POA: Diagnosis not present

## 2023-01-02 ENCOUNTER — Encounter: Payer: Self-pay | Admitting: Internal Medicine

## 2023-01-02 ENCOUNTER — Other Ambulatory Visit: Payer: Self-pay | Admitting: Family Medicine

## 2023-01-02 ENCOUNTER — Other Ambulatory Visit (HOSPITAL_COMMUNITY): Payer: Self-pay

## 2023-01-02 DIAGNOSIS — R634 Abnormal weight loss: Secondary | ICD-10-CM | POA: Diagnosis not present

## 2023-01-02 DIAGNOSIS — Z6837 Body mass index (BMI) 37.0-37.9, adult: Secondary | ICD-10-CM | POA: Diagnosis not present

## 2023-01-02 DIAGNOSIS — F17210 Nicotine dependence, cigarettes, uncomplicated: Secondary | ICD-10-CM | POA: Diagnosis not present

## 2023-01-02 DIAGNOSIS — F331 Major depressive disorder, recurrent, moderate: Secondary | ICD-10-CM

## 2023-01-02 DIAGNOSIS — K432 Incisional hernia without obstruction or gangrene: Secondary | ICD-10-CM | POA: Diagnosis not present

## 2023-01-02 NOTE — Telephone Encounter (Signed)
Lvm inquiring if she has enough to last until 01/04/23 appt

## 2023-01-03 ENCOUNTER — Other Ambulatory Visit: Payer: Self-pay | Admitting: Pharmacist

## 2023-01-03 ENCOUNTER — Telehealth: Payer: Self-pay | Admitting: Hospice and Palliative Medicine

## 2023-01-03 DIAGNOSIS — C482 Malignant neoplasm of peritoneum, unspecified: Secondary | ICD-10-CM

## 2023-01-03 MED ORDER — OLAPARIB 100 MG PO TABS: ORAL_TABLET | ORAL | 0 refills | Status: DC

## 2023-01-03 NOTE — Telephone Encounter (Signed)
I spoke with patient by phone.  She is having neck pain/headache nightly for the past month.  She says that she wakes feeling like a "[employer] truck has hit me."  She also endorses generalized pain.  Recommend [facility] visit.

## 2023-01-03 NOTE — Progress Notes (Signed)
Oral Chemotherapy Pharmacist Encounter   Rceived fax notification from [place] that patient now needed to fill at [facility] for her [place]. Lynparza prescription redirected to [facility].    Darl Pikes, PharmD, BCPS, BCOP, CPP Hematology/Oncology Clinical Pharmacist [place]/DB/AP Oral [facility] [phone]  01/03/2023 12:13 PM

## 2023-01-03 NOTE — Progress Notes (Unsigned)
Name: [name]   MRN: [id number]    DOB: 04-28-1971   Date:01/03/2023       Progress Note  Subjective  Chief Complaint  Follow Up  HPI  HTN/Tachycardia: currently on metoprolol  25 mg and heart rate is normal now, bp is at goal ,no chest pain or palpitation Unchange  Chronic back pain: still under the care of ortho and takes Lyrica and symptoms are under control now    Morbid obesity:she was diagnosed with carcinomatosis  in 11/2019 at a weight of 259 lbs. It went up to 271 lbs 08/2021, she was seen by [facility] August 2023 weight was 242 lbs. She has been eating healthier and started on Ozempic, still taking 0.25 mg weekly and has lost 9 lbs , today's weight is 234 lbs. She is happy with results, it curbs her appetite, we will increase dose to 0.5 mg and consider going up to 1mg  if tolerated.    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 Alto Denver. 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 She takes Duloxetine and Lyrica and seems to be controlling symptoms of neuropathy, pain level is 2/10 Still under the care of oncologist    Insomnia: she is now taking Lunesta 2 mg , given by oncologist and is doing well, able to fall and stay asleep. Stable    Cardiomegaly: on CT scan, however normal echo   Smoker's cough: trying to quit now, she has symbicort at home and takes it prn, however states insurance no longer going to cover, advised her to contact insurance to find out what is available on her plan    Migraines: she stopped topamax, taking imitrex prn, she denies recent episodes of migraine . No recent episodes. She is having dull headaches, not migraines lately    Depression Major:  recurrent, going on for a long time, she is back on Duloxetine since diagnosed with cancer Dec 2020. Wellbutrin was added but is under more stress, ,moved out of her daughter's house but now she is struggling to pay bills, CA125 going up and worried about recurrent of cancer. Phq 9 is higher, she is willing to try a mood stabilizer.   Dyslipidemia:  The 10-year ASCVD risk score (Arnett DK, et al., 2019) is: 4.2%   Values used to calculate the score:     Age: 52 years     Sex: Female     Is Non-Hispanic African American: No     Diabetic: No     Tobacco smoker: Yes     Systolic Blood Pressure: 536 mmHg     Is BP treated: No     HDL Cholesterol: 58 mg/dL     Total Cholesterol: 249 mg/dL   Pre-diabetes: A1C improved , down to 5.5 %  she states currently not having  polyphagia, polydipsia and polyuria   Elevated TSH: last level at goal   Patient Active Problem List   Diagnosis Date Noted   DDD (degenerative disc disease), lumbar 09/28/2022   Intertrigo 14/43/1540   Metabolic syndrome 08/67/6195   Moderate episode of recurrent major depressive disorder ([place]) 06/17/2022   Smokers' cough ([place])  05/17/2022   Dyslipidemia 05/17/2022   Elevated TSH 05/17/2022   Pre-diabetes 05/17/2022   Ventral hernia without obstruction or gangrene 04/26/2021   Malignant ascites 12/22/2019   Pleural effusion on right 12/22/2019   Tachycardia 12/22/2019   Primary peritoneal carcinomatosis ([place]) 12/16/2019   Goals of care, counseling/discussion 12/16/2019   Erythrocytosis 11/12/2019   Morbid obesity ([place]) 07/07/2018   BRCA1 gene mutation positive 06/18/2018   Fever blister 05/17/2018   Family history of breast cancer 05/08/2018   Family history of colon cancer in mother 05/08/2018   Migraine with aura and without status migrainosus 04/18/2018   Mild recurrent major depression ([place]) 01/20/2016   GERD without esophagitis 01/20/2016   Osteoarthritis, multiple sites 01/20/2016    Past Surgical History:   Procedure Laterality Date   ABDOMINAL HYSTERECTOMY  03/2020   APPENDECTOMY     LSC but "ruptured when they did the surgery"   BREAST BIOPSY Left 01/04/2021   MRI BX   CESAREAN SECTION     CYSTOSCOPY N/A 04/08/2020   Procedure: CYSTOSCOPY;  [name], MD;  [name];  Service: Gynecology;  [name];   INSERTION OF MESH N/A 04/26/2021   Procedure: INSERTION OF MESH;  [name], MD;  [name];  Service: General;  [name];   IR THORACENTESIS ASP PLEURAL SPACE W/IMG GUIDE  12/06/2019   IUD REMOVAL N/A 04/08/2020   Procedure: INTRAUTERINE DEVICE (IUD) REMOVAL;  [name], MD;  [name];  Service: Gynecology;  [name];   PARACENTESIS     x6   PORTA CATH INSERTION N/A 04/23/2020   Procedure: PORTA CATH INSERTION;  [name], MD;  Location: [facility];  Service: Cardiovascular;  [name];   TUBAL LIGATION     at time of CSxn   VENTRAL HERNIA REPAIR N/A 04/26/2021   Procedure: HERNIA REPAIR VENTRAL ADULT;  [name], MD;  [name];  Service: General;  [name];  need RNFA for the case   WRIST SURGERY Left 11/21/2016   plates and screws inserted    Family History  Adopted: Yes  Problem Relation Age of Onset   Lung cancer Father        deceased 48   Breast cancer Mother 15       currently 10   Colon cancer Mother    ADD / ADHD Son    ADD / ADHD Son    Early death Maternal Aunt    Breast cancer Maternal Aunt 86       deceased 70   Breast cancer Maternal Grandmother    Depression Daughter    Depression Daughter    Prostate cancer Paternal Uncle    Stroke Paternal Uncle    Leukemia Paternal Aunt    Breast cancer Paternal Grandmother    Cancer Maternal Uncle     Social History   Tobacco Use   Smoking status: Every Day    Packs/day: 1.00    Years: 30.00    Total pack years: 30.00    Types: Cigarettes    Smokeless tobacco: Never  Substance Use Topics   Alcohol use: Not Currently    Alcohol/week: 0.0 standard drinks of alcohol     Current Outpatient Medications:    ARIPiprazole (ABILIFY) 2 MG tablet, Take 1 tablet (2 mg total) by mouth daily., Disp: 90 tablet, Rfl: 0   buPROPion (WELLBUTRIN XL) 300 MG 24 hr tablet, Take 1  tablet (300 mg total) by mouth daily. (Patient not taking: Reported on 12/20/2022), Disp: 90 tablet, Rfl: 0   cloNIDine (CATAPRES - DOSED IN MG/24 HR) 0.3 mg/24hr patch, APPLY 1 PATCH(0.3 MG) TOPICALLY TO THE SKIN 1 TIME A WEEK, Disp: 12 patch, Rfl: 3   cloNIDine (CATAPRES) 0.1 MG tablet, Take 1 tablet (0.1 mg total) by mouth at bedtime., Disp: 90 tablet, Rfl: 3   cyclobenzaprine (FLEXERIL) 5 MG tablet, Take 5 mg by mouth 3 (three) times daily as needed., Disp: , Rfl:    DULoxetine (CYMBALTA) 60 MG capsule, Take 1 capsule (60 mg total) by mouth daily., Disp: 90 capsule, Rfl: 0   LORazepam (ATIVAN) 0.5 MG tablet, Take 1 tablet (0.5 mg total) by mouth every 12 (twelve) hours as needed for anxiety., Disp: 60 tablet, Rfl: 0   metoprolol succinate (TOPROL-XL) 25 MG 24 hr tablet, TAKE 1/2 TABLET(12.5 MG) BY MOUTH DAILY, Disp: 45 tablet, Rfl: 1   Multiple Vitamin (MULTIVITAMIN WITH MINERALS) TABS tablet, Take 1 tablet by mouth daily., Disp: , Rfl:    nystatin (MYCOSTATIN/NYSTOP) powder, Apply 1 Application topically 3 (three) times daily., Disp: 60 g, Rfl: 0   olaparib (LYNPARZA) 100 MG tablet, TAKE 2 TABLETS (200 MG) TWICE A DAY MAY TAKE WITH FOOD TO DECREASE NAUSEA AND VOMITING, Disp: 120 tablet, Rfl: 0   omeprazole (PRILOSEC) 20 MG capsule, TAKE 1 CAPSULE(20 MG) BY MOUTH DAILY, Disp: 90 capsule, Rfl: 2   ondansetron (ZOFRAN-ODT) 8 MG disintegrating tablet, Take 1 tablet (8 mg total) by mouth every 8 (eight) hours as needed for nausea or vomiting., Disp: 30 tablet, Rfl: 3   oxyCODONE (OXY IR/ROXICODONE) 5 MG immediate release tablet, Take 1 tablet (5 mg total) by mouth every 6 (six)  hours as needed for severe pain., Disp: 60 tablet, Rfl: 0   polyethylene glycol powder (GLYCOLAX/MIRALAX) 17 GM/SCOOP powder, Take 0.5 Containers by mouth daily as needed for mild constipation or moderate constipation., Disp: , Rfl:    pregabalin (LYRICA) 150 MG capsule, Take 150 mg by mouth 3 (three) times daily., Disp: , Rfl:    prochlorperazine (COMPAZINE) 10 MG tablet, Take 1 tablet (10 mg total) by mouth every 6 (six) hours as needed for nausea or vomiting., Disp: 40 tablet, Rfl: 3   senna (SENOKOT) 8.6 MG tablet, Take 1 tablet by mouth daily as needed for constipation., Disp: , Rfl:    SUMAtriptan (IMITREX) 100 MG tablet, Take 1 tablet (100 mg total) by mouth every 2 (two) hours as needed for migraine. May repeat in 2 hours if headache persists or recurs., Disp: 10 tablet, Rfl: 2   tirzepatide (MOUNJARO) 5 MG/0.5ML Pen, Inject 5 mg into the skin once a week., Disp: 6 mL, Rfl: 0   traZODone (DESYREL) 100 MG tablet, Take 1 tablet (100 mg total) by mouth at bedtime as needed for sleep., Disp: 30 tablet, Rfl: 3   valACYclovir (VALTREX) 1000 MG tablet, Take 1 tablet (1,000 mg total) by mouth 2 (two) times daily as needed., Disp: 6 tablet, Rfl: 2 No current facility-administered medications for this visit.  Facility-Administered Medications Ordered in Other Visits:    heparin lock flush 100 unit/mL, 250 Units, Intracatheter, Once PRN, Cammie Sickle, MD   ondansetron (ZOFRAN) 4 MG/2ML injection, , , ,    sodium chloride flush (NS) 0.9 % injection 10 mL, 10 mL, Intravenous, Once, Brahmanday, Govinda R, MD   sodium chloride flush (NS) 0.9 % injection 10 mL, 10 mL, Intracatheter, PRN, Cammie Sickle, MD  Allergies  Allergen Reactions   Hydroxyzine Hcl Other (See Comments)    Makes her loopy/jittery    I personally reviewed active problem list, medication list, allergies, family history, social history, health maintenance with the patient/caregiver  today.   ROS  ***  Objective  There were no vitals filed for this visit.  There is no height or weight on file to calculate BMI.  Physical Exam ***  Recent Results (from the past 2160 hour(s))  Urinalysis, dipstick only     Status: Abnormal   Collection Time: 10/21/22 10:08 AM  Result Value Ref Range   Color, Urine AMBER (A) YELLOW    Comment: BIOCHEMICALS MAY BE AFFECTED BY COLOR   APPearance CLOUDY (A) CLEAR   Specific Gravity, Urine 1.024 1.005 - 1.030   pH 5.0 5.0 - 8.0   Glucose, UA NEGATIVE NEGATIVE mg/dL   Hgb urine dipstick NEGATIVE NEGATIVE   Bilirubin Urine NEGATIVE NEGATIVE   Ketones, ur NEGATIVE NEGATIVE mg/dL   Protein, ur 30 (A) NEGATIVE mg/dL   Nitrite NEGATIVE NEGATIVE   Leukocytes,Ua TRACE (A) NEGATIVE    Comment: Performed at [facility], [address] [place], [place] [address]  Comprehensive metabolic panel     Status: Abnormal   Collection Time: 10/21/22 10:08 AM  Result Value Ref Range   Sodium 138 135 - 145 mmol/L   Potassium 3.9 3.5 - 5.1 mmol/L   Chloride 108 98 - 111 mmol/L   CO2 23 22 - 32 mmol/L   Glucose, Bld 102 (H) 70 - 99 mg/dL    Comment: Glucose reference range applies only to samples taken after fasting for at least 8 hours.   BUN 12 6 - 20 mg/dL   Creatinine, Ser 0.76 0.44 - 1.00 mg/dL   Calcium 9.1 8.9 - 10.3 mg/dL   Total Protein 7.2 6.5 - 8.1 g/dL   Albumin 4.1 3.5 - 5.0 g/dL   AST 24 15 - 41 U/L   ALT 18 0 - 44 U/L   Alkaline Phosphatase 53 38 - 126 U/L   Total Bilirubin 0.9 0.3 - 1.2 mg/dL   GFR, Estimated >60 >60 mL/min    Comment: (NOTE) Calculated using the CKD-EPI Creatinine Equation (2021)    Anion gap 7 5 - 15    Comment: Performed at [facility], [address] [place], [place] [address]  CBC with Differential     Status: Abnormal   Collection Time: 10/21/22 10:08 AM  Result Value Ref Range   WBC 4.7 4.0 - 10.5 K/uL   RBC 3.86 (L) 3.87 - 5.11 MIL/uL   Hemoglobin 15.2 (H) 12.0 - 15.0  g/dL   HCT 44.2 36.0 - 46.0 %   MCV 114.5 (H) 80.0 - 100.0 fL   MCH 39.4 (H) 26.0 - 34.0 pg   MCHC 34.4 30.0 - 36.0 g/dL   RDW 15.4 11.5 - 15.5 %   Platelets 130 (L) 150 - 400 K/uL   nRBC 0.0 0.0 - 0.2 %   Neutrophils Relative % 50 %   Neutro Abs 2.3 1.7 - 7.7 K/uL   Lymphocytes Relative 38 %   Lymphs Abs 1.8 0.7 - 4.0 K/uL   Monocytes Relative 9 %   Monocytes Absolute 0.4 0.1 - 1.0 K/uL   Eosinophils Relative 2 %   Eosinophils Absolute 0.1 0.0 - 0.5 K/uL   Basophils Relative 1 %   Basophils Absolute 0.1 0.0 - 0.1 K/uL   Immature Granulocytes 0 %   Abs Immature  Granulocytes 0.02 0.00 - 0.07 K/uL    Comment: Performed at [facility], [address] [place], [place] [address]  Urinalysis, dipstick only     Status: Abnormal   Collection Time: 11/30/22  9:18 AM  Result Value Ref Range   Color, Urine AMBER (A) YELLOW    Comment: BIOCHEMICALS MAY BE AFFECTED BY COLOR   APPearance CLOUDY (A) CLEAR   Specific Gravity, Urine 1.021 1.005 - 1.030   pH 5.0 5.0 - 8.0   Glucose, UA NEGATIVE NEGATIVE mg/dL   Hgb urine dipstick NEGATIVE NEGATIVE   Bilirubin Urine NEGATIVE NEGATIVE   Ketones, ur 5 (A) NEGATIVE mg/dL   Protein, ur 30 (A) NEGATIVE mg/dL   Nitrite NEGATIVE NEGATIVE   Leukocytes,Ua TRACE (A) NEGATIVE    Comment: Performed at [facility], [address] [place], [place] [address]  Comprehensive metabolic panel     Status: Abnormal   Collection Time: 11/30/22  9:18 AM  Result Value Ref Range   Sodium 139 135 - 145 mmol/L   Potassium 3.2 (L) 3.5 - 5.1 mmol/L   Chloride 105 98 - 111 mmol/L   CO2 24 22 - 32 mmol/L   Glucose, Bld 168 (H) 70 - 99 mg/dL    Comment: Glucose reference range applies only to samples taken after fasting for at least 8 hours.   BUN 10 6 - 20 mg/dL   Creatinine, Ser 0.83 0.44 - 1.00 mg/dL   Calcium 9.3 8.9 - 10.3 mg/dL   Total Protein 7.3 6.5 - 8.1 g/dL   Albumin 4.1 3.5 - 5.0 g/dL   AST 27 15 - 41 U/L   ALT 15 0 - 44 U/L    Alkaline Phosphatase 47 38 - 126 U/L   Total Bilirubin 0.9 0.3 - 1.2 mg/dL   GFR, Estimated >60 >60 mL/min    Comment: (NOTE) Calculated using the CKD-EPI Creatinine Equation (2021)    Anion gap 10 5 - 15    Comment: Performed at [facility] [address] [place], [place] [address]  CBC with Differential     Status: Abnormal   Collection Time: 11/30/22  9:18 AM  Result Value Ref Range   WBC 3.8 (L) 4.0 - 10.5 K/uL   RBC 4.04 3.87 - 5.11 MIL/uL   Hemoglobin 15.6 (H) 12.0 - 15.0 g/dL   HCT 45.6 36.0 - 46.0 %   MCV 112.9 (H) 80.0 - 100.0 fL   MCH 38.6 (H) 26.0 - 34.0 pg   MCHC 34.2 30.0 - 36.0 g/dL   RDW 14.7 11.5 - 15.5 %   Platelets 131 (L) 150 - 400 K/uL    Comment: SPECIMEN CHECKED FOR CLOTS   nRBC 0.0 0.0 - 0.2 %   Neutrophils Relative % 51 %   Neutro Abs 1.9 1.7 - 7.7 K/uL   Lymphocytes Relative 37 %   Lymphs Abs 1.4 0.7 - 4.0 K/uL   Monocytes Relative 8 %   Monocytes Absolute 0.3 0.1 - 1.0 K/uL   Eosinophils Relative 2 %   Eosinophils Absolute 0.1 0.0 - 0.5 K/uL   Basophils Relative 1 %   Basophils Absolute 0.1 0.0 - 0.1 K/uL   Immature Granulocytes 1 %   Abs Immature Granulocytes 0.02 0.00 - 0.07 K/uL    Comment: Performed at [facility], [place], [place] [address]  CA 125     Status: Abnormal   Collection Time: 11/30/22  9:18 AM  Result Value Ref Range  Cancer Antigen (CA) 125 49.6 (H) 0.0 - 38.1 U/mL    Comment: (NOTE) Roche [employer]ostics Electrochemiluminescence Immunoassay (ECLIA) Values obtained with different assay methods or kits cannot be used interchangeably.  Results cannot be interpreted as absolute evidence of the presence or absence of malignant disease. Performed At: [facility] [place], [place] [id number] Rush Farmer MD UK:[id number]   Urinalysis, dipstick only     Status: Abnormal   Collection Time: 12/20/22  1:33 PM  Result Value Ref Range   Color, Urine AMBER (A) YELLOW    Comment:  BIOCHEMICALS MAY BE AFFECTED BY COLOR   APPearance CLOUDY (A) CLEAR   Specific Gravity, Urine 1.030 1.005 - 1.030   pH 5.0 5.0 - 8.0   Glucose, UA NEGATIVE NEGATIVE mg/dL   Hgb urine dipstick NEGATIVE NEGATIVE   Bilirubin Urine NEGATIVE NEGATIVE   Ketones, ur 5 (A) NEGATIVE mg/dL   Protein, ur 100 (A) NEGATIVE mg/dL   Nitrite NEGATIVE NEGATIVE   Leukocytes,Ua TRACE (A) NEGATIVE    Comment: Performed at [facility], [address] [place], [place] [address]  Comprehensive metabolic panel     Status: Abnormal   Collection Time: 12/20/22  1:33 PM  Result Value Ref Range   Sodium 137 135 - 145 mmol/L   Potassium 3.7 3.5 - 5.1 mmol/L   Chloride 107 98 - 111 mmol/L   CO2 24 22 - 32 mmol/L   Glucose, Bld 117 (H) 70 - 99 mg/dL    Comment: Glucose reference range applies only to samples taken after fasting for at least 8 hours.   BUN 10 6 - 20 mg/dL   Creatinine, Ser 0.74 0.44 - 1.00 mg/dL   Calcium 9.1 8.9 - 10.3 mg/dL   Total Protein 7.4 6.5 - 8.1 g/dL   Albumin 4.3 3.5 - 5.0 g/dL   AST 22 15 - 41 U/L   ALT 12 0 - 44 U/L   Alkaline Phosphatase 46 38 - 126 U/L   Total Bilirubin 0.7 0.3 - 1.2 mg/dL   GFR, Estimated >60 >60 mL/min    Comment: (NOTE) Calculated using the CKD-EPI Creatinine Equation (2021)    Anion gap 6 5 - 15    Comment: Performed at [facility], [address] [place],  [address]  CBC with Differential     Status: Abnormal   Collection Time: 12/20/22  1:33 PM  Result Value Ref Range   WBC 4.3 4.0 - 10.5 K/uL   RBC 4.02 3.87 - 5.11 MIL/uL   Hemoglobin 15.3 (H) 12.0 - 15.0 g/dL   HCT 44.4 36.0 - 46.0 %   MCV 110.4 (H) 80.0 - 100.0 fL   MCH 38.1 (H) 26.0 - 34.0 pg   MCHC 34.5 30.0 - 36.0 g/dL   RDW 14.6 11.5 - 15.5 %   Platelets 125 (L) 150 - 400 K/uL   nRBC 0.0 0.0 - 0.2 %   Neutrophils Relative % 55 %   Neutro Abs 2.4 1.7 - 7.7 K/uL   Lymphocytes Relative 34 %   Lymphs Abs 1.5 0.7 - 4.0 K/uL   Monocytes Relative 8 %   Monocytes  Absolute 0.3 0.1 - 1.0 K/uL   Eosinophils Relative 1 %   Eosinophils Absolute 0.1 0.0 - 0.5 K/uL   Basophils Relative 1 %   Basophils Absolute 0.0 0.0 - 0.1 K/uL   Immature Granulocytes 1 %   Abs Immature Granulocytes 0.02 0.00 - 0.07 K/uL    Comment: Performed at  [facility], [place], [place] [address]  CA 125     Status: Abnormal   Collection Time: 12/20/22  1:33 PM  Result Value Ref Range   Cancer Antigen (CA) 125 38.9 (H) 0.0 - 38.1 U/mL    Comment: (NOTE) Roche Diagnostics Electrochemiluminescence Immunoassay (ECLIA) Values obtained with different assay methods or kits cannot be used interchangeably.  Results cannot be interpreted as absolute evidence of the presence or absence of malignant disease. Performed At: [facility] [address] [place], [place] [id number] Rush Farmer MD LI:[id number]     PHQ2/9:    12/12/2022    4:32 PM 09/28/2022   11:06 AM 09/28/2022   10:44 AM 08/05/2022    9:18 AM 06/17/2022    2:31 PM  Depression screen PHQ 2/9  Decreased Interest 0 3 2 2 2   Down, Depressed, Hopeless 3 3 2 2 2   PHQ - 2 Score 3 6 4 4 4   Altered sleeping 0 2 2 2 2   Tired, decreased energy 3 3 2 2 2   Change in appetite 0  0 0 2  Feeling bad or failure about yourself  3 3 0 0 1  Trouble concentrating 3 2 0 0 1  Moving slowly or fidgety/restless 0  0 0 0  Suicidal thoughts 0  0 0 0  PHQ-9 Score 12 16 8 8 12   Difficult doing work/chores Somewhat difficult   Somewhat difficult Somewhat difficult    phq 9 is {gen pos OIL:[id number]}   Fall Risk:    09/28/2022   10:57 AM 08/05/2022    9:18 AM 06/17/2022    2:31 PM 05/17/2022   11:24 AM 08/31/2021    1:43 PM  Fall Risk   Falls in the past year? 0 0 0 1 0  Number falls in past yr: 0 0 0 1 0  Injury with Fall? 0 0 0 0 0  Risk for fall due to : No Fall Risks No Fall Risks No Fall Risks No Fall Risks No Fall Risks  Follow up Falls prevention discussed Falls prevention discussed;Education  provided Falls prevention discussed;Education provided Falls prevention discussed Falls prevention discussed      Functional Status Survey:      Assessment & Plan  *** There are no diagnoses linked to this encounter.

## 2023-01-03 NOTE — Telephone Encounter (Signed)
Patient would like a call from you about how she is feeling [phone]

## 2023-01-04 ENCOUNTER — Ambulatory Visit (INDEPENDENT_AMBULATORY_CARE_PROVIDER_SITE_OTHER): Payer: Medicare Other | Admitting: Family Medicine

## 2023-01-04 ENCOUNTER — Other Ambulatory Visit (HOSPITAL_COMMUNITY): Payer: Self-pay

## 2023-01-04 ENCOUNTER — Encounter: Payer: Self-pay | Admitting: Family Medicine

## 2023-01-04 ENCOUNTER — Other Ambulatory Visit: Payer: Self-pay | Admitting: *Deleted

## 2023-01-04 ENCOUNTER — Ambulatory Visit: Payer: Medicare Other | Admitting: Orthopaedic Surgery

## 2023-01-04 VITALS — BP 120/82 | HR 89 | Temp 98.0°F | Resp 16 | Ht 63.0 in | Wt 211.0 lb

## 2023-01-04 DIAGNOSIS — I700 Atherosclerosis of aorta: Secondary | ICD-10-CM | POA: Diagnosis not present

## 2023-01-04 DIAGNOSIS — K219 Gastro-esophageal reflux disease without esophagitis: Secondary | ICD-10-CM | POA: Diagnosis not present

## 2023-01-04 DIAGNOSIS — T451X5A Adverse effect of antineoplastic and immunosuppressive drugs, initial encounter: Secondary | ICD-10-CM

## 2023-01-04 DIAGNOSIS — M5136 Other intervertebral disc degeneration, lumbar region: Secondary | ICD-10-CM

## 2023-01-04 DIAGNOSIS — G620 Drug-induced polyneuropathy: Secondary | ICD-10-CM

## 2023-01-04 DIAGNOSIS — J410 Simple chronic bronchitis: Secondary | ICD-10-CM | POA: Diagnosis not present

## 2023-01-04 DIAGNOSIS — Z1501 Genetic susceptibility to malignant neoplasm of breast: Secondary | ICD-10-CM | POA: Diagnosis not present

## 2023-01-04 DIAGNOSIS — R519 Headache, unspecified: Secondary | ICD-10-CM

## 2023-01-04 DIAGNOSIS — G43109 Migraine with aura, not intractable, without status migrainosus: Secondary | ICD-10-CM | POA: Diagnosis not present

## 2023-01-04 DIAGNOSIS — F331 Major depressive disorder, recurrent, moderate: Secondary | ICD-10-CM

## 2023-01-04 DIAGNOSIS — R7303 Prediabetes: Secondary | ICD-10-CM | POA: Diagnosis not present

## 2023-01-04 DIAGNOSIS — C482 Malignant neoplasm of peritoneum, unspecified: Secondary | ICD-10-CM

## 2023-01-04 DIAGNOSIS — Z1509 Genetic susceptibility to other malignant neoplasm: Secondary | ICD-10-CM

## 2023-01-04 DIAGNOSIS — E8941 Symptomatic postprocedural ovarian failure: Secondary | ICD-10-CM | POA: Diagnosis not present

## 2023-01-04 MED ORDER — ATORVASTATIN CALCIUM 10 MG PO TABS: 10.0000 mg | ORAL_TABLET | Freq: Every day | ORAL | 3 refills | Status: DC

## 2023-01-04 MED ORDER — TIRZEPATIDE 5 MG/0.5ML ~~LOC~~ SOAJ: 5.0000 mg | SUBCUTANEOUS | 0 refills | Status: DC

## 2023-01-04 MED ORDER — DULOXETINE HCL 60 MG PO CPEP: 60.0000 mg | ORAL_CAPSULE | Freq: Every day | ORAL | 0 refills | Status: DC

## 2023-01-04 MED ORDER — BUPROPION HCL ER (XL) 300 MG PO TB24: 300.0000 mg | ORAL_TABLET | Freq: Every day | ORAL | 0 refills | Status: DC

## 2023-01-04 MED ORDER — TIOTROPIUM BROMIDE MONOHYDRATE 18 MCG IN CAPS: 18.0000 ug | ORAL_CAPSULE | Freq: Every day | RESPIRATORY_TRACT | 1 refills | Status: DC

## 2023-01-04 MED ORDER — CARIPRAZINE HCL 1.5 MG PO CAPS: 1.5000 mg | ORAL_CAPSULE | Freq: Every day | ORAL | 0 refills | Status: DC

## 2023-01-05 ENCOUNTER — Inpatient Hospital Stay (HOSPITAL_BASED_OUTPATIENT_CLINIC_OR_DEPARTMENT_OTHER): Payer: Medicare Other | Admitting: Hospice and Palliative Medicine

## 2023-01-05 ENCOUNTER — Inpatient Hospital Stay: Payer: Medicare Other | Attending: Internal Medicine

## 2023-01-05 ENCOUNTER — Encounter: Payer: Self-pay | Admitting: Internal Medicine

## 2023-01-05 ENCOUNTER — Inpatient Hospital Stay: Payer: Medicare Other

## 2023-01-05 ENCOUNTER — Encounter: Payer: Self-pay | Admitting: Hospice and Palliative Medicine

## 2023-01-05 VITALS — BP 125/89 | HR 67 | Temp 96.0°F | Resp 17 | Wt 211.0 lb

## 2023-01-05 DIAGNOSIS — I959 Hypotension, unspecified: Secondary | ICD-10-CM | POA: Diagnosis not present

## 2023-01-05 DIAGNOSIS — C482 Malignant neoplasm of peritoneum, unspecified: Secondary | ICD-10-CM

## 2023-01-05 DIAGNOSIS — G08 Intracranial and intraspinal phlebitis and thrombophlebitis: Secondary | ICD-10-CM | POA: Insufficient documentation

## 2023-01-05 DIAGNOSIS — F32A Depression, unspecified: Secondary | ICD-10-CM | POA: Insufficient documentation

## 2023-01-05 DIAGNOSIS — Z79899 Other long term (current) drug therapy: Secondary | ICD-10-CM | POA: Insufficient documentation

## 2023-01-05 DIAGNOSIS — R519 Headache, unspecified: Secondary | ICD-10-CM | POA: Diagnosis not present

## 2023-01-05 DIAGNOSIS — G4700 Insomnia, unspecified: Secondary | ICD-10-CM | POA: Insufficient documentation

## 2023-01-05 DIAGNOSIS — F419 Anxiety disorder, unspecified: Secondary | ICD-10-CM | POA: Insufficient documentation

## 2023-01-05 DIAGNOSIS — R42 Dizziness and giddiness: Secondary | ICD-10-CM | POA: Insufficient documentation

## 2023-01-05 DIAGNOSIS — R112 Nausea with vomiting, unspecified: Secondary | ICD-10-CM | POA: Diagnosis not present

## 2023-01-05 DIAGNOSIS — E860 Dehydration: Secondary | ICD-10-CM

## 2023-01-05 LAB — COMPREHENSIVE METABOLIC PANEL
ALT: 12 U/L (ref 0–44)
AST: 25 U/L (ref 15–41)
Albumin: 3.7 g/dL (ref 3.5–5.0)
Alkaline Phosphatase: 51 U/L (ref 38–126)
Anion gap: 9 (ref 5–15)
BUN: 10 mg/dL (ref 6–20)
CO2: 24 mmol/L (ref 22–32)
Calcium: 9.3 mg/dL (ref 8.9–10.3)
Chloride: 106 mmol/L (ref 98–111)
Creatinine, Ser: 0.71 mg/dL (ref 0.44–1.00)
GFR, Estimated: >60 mL/min (ref >60)
Glucose, Bld: 103 mg/dL — ABNORMAL HIGH (ref 70–99)
Potassium: 3.6 mmol/L (ref 3.5–5.1)
Sodium: 139 mmol/L (ref 135–145)
Total Bilirubin: 0.6 mg/dL (ref 0.3–1.2)
Total Protein: 6.8 g/dL (ref 6.5–8.1)

## 2023-01-05 LAB — CBC WITH DIFFERENTIAL/PLATELET
Abs Immature Granulocytes: 0.01 10*3/uL (ref 0.00–0.07)
Basophils Absolute: 0 10*3/uL (ref 0.0–0.1)
Basophils Relative: 1 %
Eosinophils Absolute: 0.4 10*3/uL (ref 0.0–0.5)
Eosinophils Relative: 9 %
HCT: 43.4 % (ref 36.0–46.0)
Hemoglobin: 15 g/dL (ref 12.0–15.0)
Immature Granulocytes: 0 %
Lymphocytes Relative: 40 %
Lymphs Abs: 1.5 10*3/uL (ref 0.7–4.0)
MCH: 38.1 pg — ABNORMAL HIGH (ref 26.0–34.0)
MCHC: 34.6 g/dL (ref 30.0–36.0)
MCV: 110.2 fL — ABNORMAL HIGH (ref 80.0–100.0)
Monocytes Absolute: 0.4 10*3/uL (ref 0.1–1.0)
Monocytes Relative: 10 %
Neutro Abs: 1.5 10*3/uL — ABNORMAL LOW (ref 1.7–7.7)
Neutrophils Relative %: 40 %
Platelets: 112 10*3/uL — ABNORMAL LOW (ref 150–400)
RBC: 3.94 MIL/uL (ref 3.87–5.11)
RDW: 14.5 % (ref 11.5–15.5)
WBC: 3.8 10*3/uL — ABNORMAL LOW (ref 4.0–10.5)
nRBC: 0 % (ref 0.0–0.2)

## 2023-01-05 MED ORDER — TRAZODONE HCL 150 MG PO TABS: 150.0000 mg | ORAL_TABLET | Freq: Every evening | ORAL | 3 refills | Status: DC | PRN

## 2023-01-05 MED ORDER — SODIUM CHLORIDE 0.9% FLUSH
10.0000 mL | Freq: Once | INTRAVENOUS | Status: AC
  Administered 2023-01-05: 10 mL via INTRAVENOUS
  Filled 2023-01-05: qty 10

## 2023-01-05 MED ORDER — HEPARIN SOD (PORK) LOCK FLUSH 100 UNIT/ML IV SOLN
500.0000 [IU] | Freq: Once | INTRAVENOUS | Status: AC
  Administered 2023-01-05: 500 [IU] via INTRAVENOUS
  Filled 2023-01-05: qty 5

## 2023-01-05 MED ORDER — SODIUM CHLORIDE 0.9 % IV SOLN
INTRAVENOUS | Status: DC
  Filled 2023-01-05 (×2): qty 250

## 2023-01-05 NOTE — Progress Notes (Signed)
Symptom Management [place] at [facility] [phone] [phone]  Patient Care Team: Steele Sizer, [name], [name], RN as Oncology Nurse Navigator Gertrude Tarbet, [name], [name], [name], MD as Referring Physician (Obstetrics) Bary Castilla, Forest Gleason, MD as Consulting Physician (General Surgery) Gloris Ham, RN as Registered Nurse (Oncology)   NAME OF PATIENT: [name]  [id number]  1971-06-09   DATE OF VISIT: 01/05/23  REASON FOR CONSULT: ROSMARIE ESQUIBEL is a 52 y.o. 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  Patient is also status post neoadjuvant carbo/Taxol on maintenance Avastin/Lynparza.   INTERVAL HISTORY: Patient last saw Dr. Rogue Bussing on 12/20/2022 and received cycle 38 Avastin.  Patient presents [facility] today for evaluation of headache, insomnia, generalized pain.   Patient reports that she has about 1 month of generalized aches/pains, intermittent headache, and persistent insomnia.  She endorses depressive and anxiety symptoms due to loss of her housing as well as her cancer diagnosis and worries about her children.  She says that she is sleeping very little and no longer finds the trazodone to be effective.  Patient denies focal neurosymptoms such as visual changes, dizziness, syncope, weakness, or paresthesias.  Denies recent fevers or illnesses. Denies any easy bleeding or bruising. Reports  good appetite and denies weight loss. Denies chest pain. Denies any nausea, vomiting, constipation, or diarrhea. Denies urinary complaints. Patient offers no further specific complaints today.   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   Depression    Drug-induced androgenic alopecia    Dysrhythmia    Family history of breast cancer    GERD (gastroesophageal reflux disease)    Hypertension    Insomnia    Menorrhagia    Migraines    Osteoarthritis    back   Ovarian cancer ([place]) 12/10/2019   Personal history of chemotherapy    ovarian cancer   Plantar fasciitis     PAST SURGICAL HISTORY:  Past Surgical History:  Procedure Laterality Date   ABDOMINAL HYSTERECTOMY  03/2020   APPENDECTOMY     LSC but "ruptured when they did the surgery"   BREAST BIOPSY Left 01/04/2021   MRI BX   CESAREAN SECTION     CYSTOSCOPY N/A 04/08/2020   Procedure: CYSTOSCOPY;  [name], MD;  [name];  Service: Gynecology;  [name];   INSERTION OF MESH N/A 04/26/2021   Procedure: INSERTION OF MESH;  [name], MD;  [name];  Service: General;  [name];   IR THORACENTESIS ASP PLEURAL SPACE W/IMG GUIDE  12/06/2019   IUD REMOVAL N/A 04/08/2020   Procedure: INTRAUTERINE DEVICE (IUD) REMOVAL;  [name], MD;  [name];  Service: Gynecology;  [name];   PARACENTESIS     x6   PORTA CATH  INSERTION N/A 04/23/2020   Procedure: PORTA CATH INSERTION;  [name], MD;  Location: [facility];  Service: Cardiovascular;  [name];   TUBAL LIGATION     at time of CSxn   VENTRAL HERNIA REPAIR N/A 04/26/2021   Procedure: HERNIA REPAIR  VENTRAL ADULT;  [name], MD;  [name];  Service: General;  [name];  need RNFA for the case   WRIST SURGERY Left 11/21/2016   plates and screws inserted    HEMATOLOGY/ONCOLOGY HISTORY:  Oncology History Overview Note  # DEC 2020- ADENO CA [s/p Pleural effusion]; [name]; [name]; abdominal ascites status post paracentesis x2; adenocarcinoma; [name]; omental caking/peritoneal disease/no obvious evidence of bowel involvement; no adnexal masses readily noted; Ca [phone].   # 12/23/2019- Carbo-Taxol #1; Jan 18 th 2021- #2 carbo-Taxol-Bev status post 4 cycles-April 08, 2020-debulking surgery [Dr. Secord] miliary disease noted post surgery. Carbo-Taxol-Avastin x6  # July 6th 2021- Avastin q 3 W+ OLAPARIB 300 mg BID  # OCT 26th, 2021-recurrent anemia [hemoglobin 7.5]; [name]; [name], [name]; [name]; [name], [name]; apocrine metaplasia/benign; [name]; [name]; [name]; [name]; s/p genetics counseling; [name]; July 2019- 2-3cm-right complex ovarian cyst- likely benign/hemorrhagic [also 2011].  # # NGS/MOLECULAR TESTS:P    # PALLIATIVE CARE EVALUATION:P  # PAIN MANAGEMENT: NA  DIAGNOSIS: Primary peritoneal adenocarcinoma  STAGE:   IV      ;  GOALS: control  CURRENT/MOST RECENT THERAPY : Avastin maintenace    Primary peritoneal carcinomatosis ([place])  12/16/2019 Initial  Diagnosis   Primary peritoneal adenocarcinoma ([place])   12/23/2019 - 07/08/2022 Chemotherapy   Patient is on Treatment Plan : Carboplatin + Paclitaxel + Mvasi q21d     12/23/2019 -  Chemotherapy   Patient is on Treatment Plan : OVARIAN Carboplatin + Paclitaxel + Bevacizumab q21d      01/07/2021 Cancer Staging   Staging form: Ovary, Fallopian Tube, and Primary Peritoneal Carcinoma, [name], MD on 01/07/2021     ALLERGIES:  is allergic to hydroxyzine hcl.  MEDICATIONS:  Current Outpatient Medications  Medication Sig Dispense Refill   atorvastatin (LIPITOR) 10 MG tablet Take 1 tablet (10 mg total) by mouth daily. 90 tablet 3   buPROPion (WELLBUTRIN XL) 300 MG 24 hr tablet Take 1 tablet (300 mg total) by mouth daily. 90 tablet 0   cariprazine (VRAYLAR) 1.5 MG capsule Take 1 capsule (1.5 mg total) by mouth daily. 90 capsule 0   cloNIDine (CATAPRES - DOSED IN MG/24 HR) 0.3 mg/24hr patch APPLY 1 PATCH(0.3 MG) TOPICALLY TO THE SKIN 1 TIME A WEEK 12 patch 3   cloNIDine (CATAPRES) 0.1 MG tablet Take 1 tablet (0.1 mg total) by mouth at bedtime. 90 tablet 3   cyclobenzaprine (FLEXERIL) 5 MG tablet Take 5 mg by mouth 3 (three) times daily as needed.     DULoxetine (CYMBALTA) 60 MG capsule Take  1 capsule (60 mg total) by mouth daily. 90 capsule 0   LORazepam (ATIVAN) 0.5 MG tablet Take 1 tablet (0.5 mg total) by mouth every 12 (twelve) hours as needed for anxiety. 60 tablet 0   meloxicam (MOBIC) 15 MG tablet Take 15 mg by mouth daily.     metoprolol succinate (TOPROL-XL) 25 MG 24 hr tablet TAKE 1/2 TABLET(12.5 MG) BY MOUTH DAILY 45 tablet 1   Multiple Vitamin (MULTIVITAMIN WITH MINERALS) TABS tablet Take 1 tablet by mouth daily.     nystatin (MYCOSTATIN/NYSTOP) powder Apply 1 Application topically 3 (three) times daily. 60 g 0   olaparib (LYNPARZA) 100 MG tablet TAKE 2 TABLETS (200 MG) TWICE A DAY MAY TAKE WITH FOOD TO DECREASE  NAUSEA AND VOMITING 120 tablet 0   omeprazole (PRILOSEC) 20 MG capsule TAKE 1 CAPSULE(20 MG) BY MOUTH DAILY 90 capsule 2   ondansetron (ZOFRAN-ODT) 8 MG disintegrating tablet Take 1 tablet (8 mg total) by mouth every 8 (eight) hours as needed for nausea or vomiting. 30 tablet 3   oxyCODONE (OXY IR/ROXICODONE) 5 MG immediate release tablet Take 1 tablet (5 mg total) by mouth every 6 (six) hours as needed for severe pain. 60 tablet 0   polyethylene glycol powder (GLYCOLAX/MIRALAX) 17 GM/SCOOP powder Take 0.5 Containers by mouth daily as needed for mild constipation or moderate constipation.     pregabalin (LYRICA) 150 MG capsule Take 150 mg by mouth 3 (three) times daily.     prochlorperazine (COMPAZINE) 10 MG tablet Take 1 tablet (10 mg total) by mouth every 6 (six) hours as needed for nausea or vomiting. 40 tablet 3   senna (SENOKOT) 8.6 MG tablet Take 1 tablet by mouth daily as needed for constipation.     SUMAtriptan (IMITREX) 100 MG tablet Take 1 tablet (100 mg total) by mouth every 2 (two) hours as needed for migraine. May repeat in 2 hours if headache persists or recurs. 10 tablet 2   tiotropium (SPIRIVA HANDIHALER) 18 MCG inhalation capsule Place 1 capsule (18 mcg total) into inhaler and inhale daily. 90 capsule 1   tirzepatide (MOUNJARO) 5 MG/0.5ML Pen Inject 5 mg into the skin once a week. 6 mL 0   traZODone (DESYREL) 100 MG tablet Take 1 tablet (100 mg total) by mouth at bedtime as needed for sleep. 30 tablet 3   valACYclovir (VALTREX) 1000 MG tablet Take 1 tablet (1,000 mg total) by mouth 2 (two) times daily as needed. 6 tablet 2   Varenicline Tartrate, Starter, 0.5 MG X 11 & 1 MG X 42 TBPK Take by mouth as directed.     No current facility-administered medications for this visit.   Facility-Administered Medications Ordered in Other Visits  Medication Dose Route Frequency Provider Last Rate Last Admin   heparin lock flush 100 unit/mL  250 Units Intracatheter Once PRN Cammie Sickle, MD       ondansetron [facility]) 4 MG/2ML injection            sodium chloride flush (NS) 0.9 % injection 10 mL  10 mL Intravenous Once Charlaine Dalton R, MD       sodium chloride flush (NS) 0.9 % injection 10 mL  10 mL Intracatheter PRN Cammie Sickle, MD       sodium chloride flush (NS) 0.9 % injection 10 mL  10 mL Intravenous Once Martita Brumm, [name], NP        VITAL SIGNS: BP 125/89 (Patient Position: Sitting)   Pulse 67  Temp (!) 96 F (35.6 C) (Tympanic)   Resp 17   LMP 06/19/2017   SpO2 98%  There were no vitals filed for this visit.  Estimated body mass index is 37.38 kg/m as calculated from the following:   Height as of 01/04/23: 5\' 3"  (1.6 m).   Weight as of 01/04/23: 211 lb (95.7 kg).  LABS: CBC:    Component Value Date/Time   WBC 4.3 12/20/2022 1333   HGB 15.3 (H) 12/20/2022 1333   HCT 44.4 12/20/2022 1333   PLT 125 (L) 12/20/2022 1333   MCV 110.4 (H) 12/20/2022 1333   NEUTROABS 2.4 12/20/2022 1333   LYMPHSABS 1.5 12/20/2022 1333   MONOABS 0.3 12/20/2022 1333   EOSABS 0.1 12/20/2022 1333   BASOSABS 0.0 12/20/2022 1333   Comprehensive Metabolic Panel:    Component Value Date/Time   NA 137 12/20/2022 1333   K 3.7 12/20/2022 1333   CL 107 12/20/2022 1333   CO2 24 12/20/2022 1333   BUN 10 12/20/2022 1333   CREATININE 0.74 12/20/2022 1333   CREATININE 0.79 10/25/2019 0000   GLUCOSE 117 (H) 12/20/2022 1333   CALCIUM 9.1 12/20/2022 1333   AST 22 12/20/2022 1333   ALT 12 12/20/2022 1333   ALKPHOS 46 12/20/2022 1333   BILITOT 0.7 12/20/2022 1333   PROT 7.4 12/20/2022 1333   ALBUMIN 4.3 12/20/2022 1333    RADIOGRAPHIC STUDIES: CT CHEST ABDOMEN PELVIS W CONTRAST  Result Date: 12/15/2022 CLINICAL DATA:  History of ovarian cancer monitor. Rising CA 125. * Tracking Code: BO * EXAM: CT CHEST, ABDOMEN, AND PELVIS WITH CONTRAST TECHNIQUE: Multidetector CT imaging of the chest, abdomen and pelvis was performed following the standard protocol during bolus  administration of intravenous contrast. RADIATION DOSE REDUCTION: This exam was performed according to the departmental dose-optimization program which includes automated exposure control, adjustment of the mA and/or kV according to patient size and/or use of iterative reconstruction technique. CONTRAST:  169mL OMNIPAQUE IOHEXOL 300 MG/ML  SOLN COMPARISON:  Multiple priors including most recent CT chest abdomen and pelvis dated August 30, 2022 FINDINGS: CT CHEST FINDINGS Cardiovascular: Accessed right chest Port-A-Cath with tip at the superior cavoatrial junction. Normal caliber thoracic aorta. No central pulmonary embolus on this nondedicated study. Normal size heart. No significant pericardial effusion/thickening. Mediastinum/Nodes: No supraclavicular adenopathy. No suspicious thyroid nodule. No pathologically enlarged mediastinal, hilar or axillary lymph nodes. Retained versus refluxed orally ingested contrast in the esophagus. Lungs/Pleura: No suspicious pulmonary nodules or masses. No pleural effusion. No pneumothorax. Musculoskeletal: No aggressive lytic or blastic lesion of bone. CT ABDOMEN PELVIS FINDINGS Hepatobiliary: No suspicious hepatic lesion. Gallbladder is unremarkable. No biliary ductal dilation. Pancreas: Pancreatic ductal dilation or evidence of acute inflammation. Spleen: No splenomegaly. Adrenals/Urinary Tract: Bilateral adrenal glands appear normal. No hydronephrosis. Kidneys demonstrate symmetric enhancement. Urinary bladder is unremarkable for degree of distension. Stomach/Bowel: Radiopaque enteric contrast material traverses the rectum. Stomach is unremarkable. No pathologic dilation of small or large bowel. Colonic diverticulosis without findings of acute diverticulitis. Vascular/Lymphatic: Normal caliber abdominal aorta. No pathologically enlarged abdominal or pelvic lymph nodes. Reproductive: Uterus is surgically absent without new enhancing soft tissue nodularity along the vaginal  cuff. No suspicious adnexal mass. Other: Similar trace pelvic free fluid without discrete omental or peritoneal nodularity. Diastasis rectus with a ventral hernia containing fat and nonobstructed portions of small bowel appear similar prior. Musculoskeletal: No aggressive lytic or blastic lesion of bone. IMPRESSION: 1. No evidence of local recurrence or metastatic disease in the chest, abdomen, or pelvis.  2. Colonic diverticulosis without findings of acute diverticulitis. 3. Retained versus refluxed orally ingested contrast in the esophagus. Correlate for symptoms of gastroesophageal reflux. 4. Diastasis rectus with a ventral hernia containing fat and nonobstructed portions of small bowel appear similar prior. Electronically Signed   By: Dahlia Bailiff M.D.   On: 12/15/2022 10:18    PERFORMANCE STATUS (ECOG) : 1 - Symptomatic but completely ambulatory  Review of Systems Unless otherwise noted, a complete review of systems is negative.  Physical Exam General: NAD Cardiovascular: regular rate and rhythm Pulmonary: clear ant fields Abdomen: soft, nontender, + bowel sounds GU: no suprapubic tenderness Extremities: no edema, no joint deformities Skin: no rashes Neurological: Weakness but otherwise nonfocal  IMPRESSION/PLAN: High-grade serous adenocarcinoma  - on Avastin Lynparza with recent CT of the chest, abdomen, and pelvis without evidence of local recurrence or metastatic disease.  Headache -no focal neurological symptoms on exam today.  Discussed with Dr. Rogue Bussing who did not recommend further imaging workup.  Patient says that she has been referred to neurology by her PCP.  Insomnia -will increase trazodone 150 mg nightly.  I suspect that anxiety and depression are also contributing to her inability to sleep.  However, will send patient to a sleep specialist for further workup including consideration of sleep study given obesity.  Anxiety/depression -continue duloxetine.  Referral to  psychology  Case and plan discussed with Dr. Rogue Bussing  Patient expressed understanding and was in agreement with this plan. She also understands that She can call clinic at any time with any questions, concerns, or complaints.   Thank you for allowing me to participate in the care of this very pleasant patient.   Time Total: 20 minutes  Visit consisted of counseling and education dealing with the complex and emotionally intense issues of symptom management in the setting of serious illness.Greater than 50%  of this time was spent counseling and coordinating care related to the above assessment and plan.  Signed by: Altha Harm, PhD, NP-C

## 2023-01-05 NOTE — Progress Notes (Signed)
Patient tolerated 1 L IVF infusion well, no questions/concerns voiced. Patient stable at discharge. AVS given.

## 2023-01-05 NOTE — Patient Instructions (Signed)
[facility] CANCER CTR AT [place]  Discharge Instructions: Thank you for choosing [facility] to provide your oncology and hematology care.  If you have a lab appointment with the [facility], please go directly to the [facility] and check in at the registration area.  Wear comfortable clothing and clothing appropriate for easy access to any Portacath or PICC line.   We strive to give you quality time with your provider. You may need to reschedule your appointment if you arrive late (15 or more minutes).  Arriving late affects you and other patients whose appointments are after yours.  Also, if you miss three or more appointments without notifying the office, you may be dismissed from the clinic at the provider's discretion.      For prescription refill requests, have your pharmacy contact our office and allow 72 hours for refills to be completed.    Today you received IV fluids   To help prevent nausea and vomiting after your treatment, we encourage you to take your nausea medication as directed.  BELOW ARE SYMPTOMS THAT SHOULD BE REPORTED IMMEDIATELY: *FEVER GREATER THAN 100.4 F (38 C) OR HIGHER *CHILLS OR SWEATING *NAUSEA AND VOMITING THAT IS NOT CONTROLLED WITH YOUR NAUSEA MEDICATION *UNUSUAL SHORTNESS OF BREATH *UNUSUAL BRUISING OR BLEEDING *URINARY PROBLEMS (pain or burning when urinating, or frequent urination) *BOWEL PROBLEMS (unusual diarrhea, constipation, pain near the anus) TENDERNESS IN MOUTH AND THROAT WITH OR WITHOUT PRESENCE OF ULCERS (sore throat, sores in mouth, or a toothache) UNUSUAL RASH, SWELLING OR PAIN  UNUSUAL VAGINAL DISCHARGE OR ITCHING   Items with * indicate a potential emergency and should be followed up as soon as possible or go to the Emergency Department if any problems should occur.  Please show the CHEMOTHERAPY ALERT CARD or IMMUNOTHERAPY ALERT CARD at check-in to the Emergency Department and triage nurse.  Should you  have questions after your visit or need to cancel or reschedule your appointment, please contact [facility] CANCER [place] AT [place]  [phone] and follow the prompts.  Office hours are 8:00 a.m. to 4:30 p.m. Monday - Friday. Please note that voicemails left after 4:00 p.m. may not be returned until the following business day.  We are closed weekends and major holidays. You have access to a nurse at all times for urgent questions. Please call the main number to the clinic [phone] and follow the prompts.  For any non-urgent questions, you may also contact your provider using MyChart. We now offer e-Visits for anyone 19 and older to request care online for non-urgent symptoms. For details visit [email or web address].   Also download the MyChart app! Go to the app store, search "MyChart", open the app, select [facility], and log in with your MyChart username and password.

## 2023-01-05 NOTE — Progress Notes (Signed)
Patient here for oncology follow-up appointment, expresses no complaints or concerns at this time.    

## 2023-01-06 LAB — CA 125: Cancer Antigen (CA) 125: 43.8 U/mL — ABNORMAL HIGH (ref 0.0–38.1)

## 2023-01-10 ENCOUNTER — Ambulatory Visit: Payer: Self-pay

## 2023-01-10 ENCOUNTER — Inpatient Hospital Stay: Payer: Medicare Other

## 2023-01-10 ENCOUNTER — Inpatient Hospital Stay (HOSPITAL_BASED_OUTPATIENT_CLINIC_OR_DEPARTMENT_OTHER): Payer: Medicare Other | Admitting: Internal Medicine

## 2023-01-10 ENCOUNTER — Other Ambulatory Visit: Payer: Self-pay

## 2023-01-10 ENCOUNTER — Encounter (HOSPITAL_COMMUNITY): Payer: Self-pay

## 2023-01-10 ENCOUNTER — Other Ambulatory Visit: Payer: Self-pay | Admitting: Family Medicine

## 2023-01-10 ENCOUNTER — Inpatient Hospital Stay (HOSPITAL_COMMUNITY)
Admission: EM | Admit: 2023-01-10 | Discharge: 2023-01-12 | DRG: 092 | Disposition: A | Discharge status: Home / Self Care | Payer: Medicare Other | Attending: Family Medicine | Admitting: Family Medicine

## 2023-01-10 ENCOUNTER — Encounter: Payer: Self-pay | Admitting: Internal Medicine

## 2023-01-10 ENCOUNTER — Ambulatory Visit
Admission: RE | Admit: 2023-01-10 | Discharge: 2023-01-10 | Disposition: A | Discharge status: Home / Self Care | Payer: Medicare Other | Source: Ambulatory Visit | Attending: Internal Medicine | Admitting: Internal Medicine

## 2023-01-10 VITALS — BP 120/72

## 2023-01-10 VITALS — BP 91/70 | HR 65 | Temp 96.7°F | Resp 16 | Ht 63.0 in | Wt 210.3 lb

## 2023-01-10 DIAGNOSIS — E785 Hyperlipidemia, unspecified: Secondary | ICD-10-CM | POA: Diagnosis present

## 2023-01-10 DIAGNOSIS — Z801 Family history of malignant neoplasm of trachea, bronchus and lung: Secondary | ICD-10-CM

## 2023-01-10 DIAGNOSIS — Z515 Encounter for palliative care: Secondary | ICD-10-CM

## 2023-01-10 DIAGNOSIS — I676 Nonpyogenic thrombosis of intracranial venous system: Secondary | ICD-10-CM | POA: Diagnosis not present

## 2023-01-10 DIAGNOSIS — G08 Intracranial and intraspinal phlebitis and thrombophlebitis: Secondary | ICD-10-CM | POA: Insufficient documentation

## 2023-01-10 DIAGNOSIS — R519 Headache, unspecified: Secondary | ICD-10-CM | POA: Diagnosis not present

## 2023-01-10 DIAGNOSIS — Z806 Family history of leukemia: Secondary | ICD-10-CM

## 2023-01-10 DIAGNOSIS — I700 Atherosclerosis of aorta: Secondary | ICD-10-CM

## 2023-01-10 DIAGNOSIS — Z9221 Personal history of antineoplastic chemotherapy: Secondary | ICD-10-CM | POA: Diagnosis not present

## 2023-01-10 DIAGNOSIS — C482 Malignant neoplasm of peritoneum, unspecified: Secondary | ICD-10-CM | POA: Diagnosis not present

## 2023-01-10 DIAGNOSIS — E860 Dehydration: Secondary | ICD-10-CM | POA: Diagnosis not present

## 2023-01-10 DIAGNOSIS — Z85118 Personal history of other malignant neoplasm of bronchus and lung: Secondary | ICD-10-CM

## 2023-01-10 DIAGNOSIS — Z7189 Other specified counseling: Secondary | ICD-10-CM

## 2023-01-10 DIAGNOSIS — Z888 Allergy status to other drugs, medicaments and biological substances status: Secondary | ICD-10-CM | POA: Diagnosis not present

## 2023-01-10 DIAGNOSIS — Z86718 Personal history of other venous thrombosis and embolism: Secondary | ICD-10-CM | POA: Diagnosis not present

## 2023-01-10 DIAGNOSIS — Z823 Family history of stroke: Secondary | ICD-10-CM | POA: Diagnosis not present

## 2023-01-10 DIAGNOSIS — Z800 Family history of malignant neoplasm of digestive organs: Secondary | ICD-10-CM

## 2023-01-10 DIAGNOSIS — Z803 Family history of malignant neoplasm of breast: Secondary | ICD-10-CM | POA: Diagnosis not present

## 2023-01-10 DIAGNOSIS — F17210 Nicotine dependence, cigarettes, uncomplicated: Secondary | ICD-10-CM | POA: Diagnosis present

## 2023-01-10 DIAGNOSIS — K219 Gastro-esophageal reflux disease without esophagitis: Secondary | ICD-10-CM | POA: Diagnosis present

## 2023-01-10 DIAGNOSIS — F331 Major depressive disorder, recurrent, moderate: Secondary | ICD-10-CM

## 2023-01-10 DIAGNOSIS — G43909 Migraine, unspecified, not intractable, without status migrainosus: Secondary | ICD-10-CM | POA: Diagnosis present

## 2023-01-10 DIAGNOSIS — Z1501 Genetic susceptibility to malignant neoplasm of breast: Secondary | ICD-10-CM

## 2023-01-10 DIAGNOSIS — I10 Essential (primary) hypertension: Secondary | ICD-10-CM | POA: Diagnosis present

## 2023-01-10 DIAGNOSIS — L659 Nonscarring hair loss, unspecified: Secondary | ICD-10-CM | POA: Diagnosis present

## 2023-01-10 DIAGNOSIS — Z818 Family history of other mental and behavioral disorders: Secondary | ICD-10-CM

## 2023-01-10 DIAGNOSIS — Z791 Long term (current) use of non-steroidal anti-inflammatories (NSAID): Secondary | ICD-10-CM

## 2023-01-10 DIAGNOSIS — F32A Depression, unspecified: Secondary | ICD-10-CM | POA: Diagnosis present

## 2023-01-10 DIAGNOSIS — F419 Anxiety disorder, unspecified: Secondary | ICD-10-CM | POA: Diagnosis present

## 2023-01-10 DIAGNOSIS — Z79899 Other long term (current) drug therapy: Secondary | ICD-10-CM

## 2023-01-10 LAB — COMPREHENSIVE METABOLIC PANEL
ALT: 13 U/L (ref 0–44)
AST: 19 U/L (ref 15–41)
Albumin: 3.9 g/dL (ref 3.5–5.0)
Alkaline Phosphatase: 49 U/L (ref 38–126)
Anion gap: 8 (ref 5–15)
BUN: 12 mg/dL (ref 6–20)
CO2: 25 mmol/L (ref 22–32)
Calcium: 9 mg/dL (ref 8.9–10.3)
Chloride: 104 mmol/L (ref 98–111)
Creatinine, Ser: 0.83 mg/dL (ref 0.44–1.00)
GFR, Estimated: >60 mL/min (ref >60)
Glucose, Bld: 112 mg/dL — ABNORMAL HIGH (ref 70–99)
Potassium: 3.6 mmol/L (ref 3.5–5.1)
Sodium: 137 mmol/L (ref 135–145)
Total Bilirubin: 0.8 mg/dL (ref 0.3–1.2)
Total Protein: 6.9 g/dL (ref 6.5–8.1)

## 2023-01-10 LAB — CBC WITH DIFFERENTIAL/PLATELET
Abs Immature Granulocytes: 0.01 10*3/uL (ref 0.00–0.07)
Basophils Absolute: 0 10*3/uL (ref 0.0–0.1)
Basophils Relative: 1 %
Eosinophils Absolute: 0.5 10*3/uL (ref 0.0–0.5)
Eosinophils Relative: 10 %
HCT: 44.9 % (ref 36.0–46.0)
Hemoglobin: 15.3 g/dL — ABNORMAL HIGH (ref 12.0–15.0)
Immature Granulocytes: 0 %
Lymphocytes Relative: 29 %
Lymphs Abs: 1.5 10*3/uL (ref 0.7–4.0)
MCH: 37.6 pg — ABNORMAL HIGH (ref 26.0–34.0)
MCHC: 34.1 g/dL (ref 30.0–36.0)
MCV: 110.3 fL — ABNORMAL HIGH (ref 80.0–100.0)
Monocytes Absolute: 0.4 10*3/uL (ref 0.1–1.0)
Monocytes Relative: 8 %
Neutro Abs: 2.7 10*3/uL (ref 1.7–7.7)
Neutrophils Relative %: 52 %
Platelets: 127 10*3/uL — ABNORMAL LOW (ref 150–400)
RBC: 4.07 MIL/uL (ref 3.87–5.11)
RDW: 14.5 % (ref 11.5–15.5)
WBC: 5.2 10*3/uL (ref 4.0–10.5)
nRBC: 0 % (ref 0.0–0.2)

## 2023-01-10 LAB — APTT
aPTT: >200 seconds (ref 24–36)
aPTT: >200 seconds (ref 24–36)

## 2023-01-10 LAB — PROTIME-INR
INR: 1.1 (ref 0.8–1.2)
INR: 1.1 (ref 0.8–1.2)
Prothrombin Time: 13.8 seconds (ref 11.4–15.2)
Prothrombin Time: 14.3 seconds (ref 11.4–15.2)

## 2023-01-10 MED ORDER — ONDANSETRON HCL 4 MG/2ML IJ SOLN: 4.0000 mg | Freq: Four times a day (QID) | INTRAMUSCULAR | Status: DC | PRN

## 2023-01-10 MED ORDER — HEPARIN SOD (PORK) LOCK FLUSH 100 UNIT/ML IV SOLN
500.0000 [IU] | Freq: Once | INTRAVENOUS | Status: AC | PRN
  Administered 2023-01-10: 500 [IU]
  Filled 2023-01-10: qty 5

## 2023-01-10 MED ORDER — PROCHLORPERAZINE MALEATE 10 MG PO TABS: 10.0000 mg | ORAL_TABLET | Freq: Once | ORAL | Status: DC

## 2023-01-10 MED ORDER — MORPHINE SULFATE (PF) 2 MG/ML IV SOLN
2.0000 mg | INTRAVENOUS | Status: DC | PRN
  Administered 2023-01-11 – 2023-01-12 (×8): 2 mg via INTRAVENOUS
  Filled 2023-01-10 (×8): qty 1

## 2023-01-10 MED ORDER — SODIUM CHLORIDE 0.9 % IV SOLN
Freq: Once | INTRAVENOUS | Status: AC
  Filled 2023-01-10: qty 250

## 2023-01-10 MED ORDER — ONDANSETRON HCL 4 MG/2ML IJ SOLN
8.0000 mg | Freq: Once | INTRAMUSCULAR | Status: AC
  Administered 2023-01-10: 8 mg via INTRAVENOUS
  Filled 2023-01-10: qty 4

## 2023-01-10 MED ORDER — SODIUM CHLORIDE 0.9 % IV SOLN: Freq: Once | INTRAVENOUS | Status: DC

## 2023-01-10 MED ORDER — OXYCODONE HCL 5 MG PO TABS: 5.0000 mg | ORAL_TABLET | Freq: Three times a day (TID) | ORAL | 0 refills | Status: DC | PRN

## 2023-01-10 MED ORDER — MECLIZINE HCL 25 MG PO TABS: ORAL_TABLET | ORAL | 0 refills | Status: DC

## 2023-01-10 MED ORDER — GADOBUTROL 1 MMOL/ML IV SOLN
9.0000 mL | Freq: Once | INTRAVENOUS | Status: AC | PRN
  Administered 2023-01-10: 9 mL via INTRAVENOUS

## 2023-01-10 MED ORDER — ONDANSETRON HCL 4 MG PO TABS: 4.0000 mg | ORAL_TABLET | Freq: Four times a day (QID) | ORAL | Status: DC | PRN

## 2023-01-10 MED ORDER — SODIUM CHLORIDE 0.9% FLUSH
10.0000 mL | Freq: Once | INTRAVENOUS | Status: AC | PRN
  Administered 2023-01-10: 10 mL
  Filled 2023-01-10: qty 10

## 2023-01-10 MED ORDER — DEXTROSE IN LACTATED RINGERS 5 % IV SOLN: INTRAVENOUS | Status: DC

## 2023-01-10 MED ORDER — HEPARIN BOLUS VIA INFUSION
5000.0000 [IU] | Freq: Once | INTRAVENOUS | Status: AC
  Administered 2023-01-10: 5000 [IU] via INTRAVENOUS
  Filled 2023-01-10: qty 5000

## 2023-01-10 MED ORDER — PROCHLORPERAZINE MALEATE 10 MG PO TABS
10.0000 mg | ORAL_TABLET | Freq: Four times a day (QID) | ORAL | Status: DC | PRN
  Administered 2023-01-10: 10 mg via ORAL
  Filled 2023-01-10: qty 1

## 2023-01-10 MED ORDER — HEPARIN (PORCINE) 25000 UT/250ML-% IV SOLN
1050.0000 [IU]/h | INTRAVENOUS | Status: DC
  Administered 2023-01-10: 1250 [IU]/h via INTRAVENOUS
  Administered 2023-01-11: 1050 [IU]/h via INTRAVENOUS
  Filled 2023-01-10 (×2): qty 250

## 2023-01-10 MED ORDER — ROSUVASTATIN CALCIUM 5 MG PO TABS: 5.0000 mg | ORAL_TABLET | Freq: Every day | ORAL | 0 refills | Status: DC

## 2023-01-10 NOTE — Progress Notes (Signed)
Brief Neuro Note:  Briefly, Ms. CHRISTLE NOLTING is a 52 y.o. female with high-grade serous adenocarcinoma primary peritoneal who has been having headache for a month. Headache worse with lying flat. 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 She has no acute stroke, no focal deficit per discussion with ED team at [facility].  Recs - Heparin gtt for now. Transition to fll dose Lovenox in a day or 2. Will need heparin in the acute phase for 5-7 days, followed by Pradaxa BID with outpatient neurology follow up. - Okay to transfer to [facility], some utility in evaluation by our stroke team.  Erick Blinks [facility] Pager Number [phone]

## 2023-01-10 NOTE — Assessment & Plan Note (Addendum)
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 On avastin + lynparza-STABLE.  CT DEC 21st, 2023-CT CAP- No evidence of local recurrence or metastatic disease in the chest, abdomen, or pelvis.Understands patient currently has biochemical recurrence.     #  HOLD Avastin treatment today [see discussion below].  Labs reviewed.;  CBC- platelets >100; CMP are reviewed; adequate today. Continue Lynparza 200 mg twice a day [severe anemia while on 250 mg twice a day].  Ca125 -normal limits BUT RISING- DEC 2023- 43.  # Headaches/anxiety insomnia/dizzyness-unlikely metastatic disease to the brain.  However I think is reasonable to consider MRI brain given the rising tumor markers in absence of any obvious imaging findings on CT scan. Order MRI brain STAT.   # Abdominal hernia-CT scan  DEC 2023-  ventral hernia noted; no obstruction.  JAN 2024 appt with surgeon at [facility] . # Weight loss/obesity- on Monjauro- losing weight.   # Hypertension: However today- hypotension/dizzy- HOLD avastin; plan IVFs over 1 hours.  See above.  # PN G-2/back pain [awaiting steroid injection]- on Lyrica 50 mg TID/Cymbalta- STABLE,   # Dizziness vertigo-prescribed Antivert.  # Nausea-sec to [place];  Continue Zofran/ compazine prn monitor for now.   # Anxiety/Insomnia--social stressors-continue Cymbalta [at 20 mg/day]; currently increased to trazadone 150 mg qhs- see above.   #IV access/Mediport-currently s/p TPA- port flush  # DISPOSITION:  # add CK levels to labs today # STAT MRI Brain  #  HOLD avastin today; instead IVFs over 1 hour; zofran and comapzine  # in 1 week/Thursday - MD;  Avastin; port/labs-  cbc/cmp; ca-125; urine random protein:creatiine ratio; Dr.B  Addendum: Brain MRI-nonocclusive dural venous sinus thrombosis noted-without any brain infarct or any metastatic disease to the brain.  Unable to reach the patient; left voicemail-asking patient  to go to the emergency room now.  And also tried to reach the patient's sister-unable to reach; left a voicemail above message.  Will also inform on-call physician.

## 2023-01-10 NOTE — Progress Notes (Signed)
Increased headaches that are worse when lying down at night.  The headaches are keeping her from sleeping.    Bilateral arm/shoulder "bone pain" with R>L for 6 months that is worsening, 10/10 pain at night.  Has been seen by orthopedic in [place].  Has been in contact with PCP to discuss side effects from Atorvastatin.   BP 91/70, HR 65.  Has not taken Metoprolol today.  Has been feeling dizzy with episode of near syncope episode this morning and thought it was due to Atorvastatin.

## 2023-01-10 NOTE — ED Triage Notes (Signed)
Went to [facility] today for chemo. Has had ongoing dizziness and headaches ~ 1 month. Admits up bilateral upper extremity weakness worst on right side for "about that long".   They withheld Chemo and did STAT MRI brain. Was called later today and directed to ER for eval r/t results of nonocclusive dural venous sinus thrombus.
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
## 2023-01-10 NOTE — ED Provider Notes (Signed)
[facility] DEPT Provider Note   CSN: [id number] Arrival date & time: 01/10/23  1846     History  Chief Complaint  Patient presents with   Abnormal CT    [name] is a 52 y.o. female.  52 year old female presents due to abnormal brain MRI.  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  While there, patient has been complaining of persistent headache x 1 month.  Her headache is occipital in nature.  She has had nausea but no vomiting.  No focal weakness.  Patient was hypotensive felt to be from dehydration given IV fluids.  Patient states her headache is worse with lying flat and better with sitting up.  She was sent for outpatient MRI which did show a nonocclusive dural venous sinus thrombosis.  She does have remote history of right extremity DVT approximately 3 years ago.  She does not take anticoagulation at this time.  She does not complain of any shortness of breath or leg pain or swelling.       Home Medications Prior to Admission medications   Medication Sig Start Date End Date Taking? Authorizing Provider  buPROPion (WELLBUTRIN XL) 300 MG 24 hr tablet Take 1 tablet (300 mg total) by mouth daily. 01/04/23   Steele Sizer, MD  cariprazine (VRAYLAR) 1.5 MG capsule Take 1 capsule (1.5 mg total) by mouth daily. 01/04/23   Steele Sizer, MD  cloNIDine (CATAPRES - DOSED IN MG/24 HR) 0.3 mg/24hr patch APPLY 1 PATCH(0.3 MG) TOPICALLY TO THE SKIN 1 TIME A WEEK 12/01/22   Verlon Au, NP  cloNIDine (CATAPRES) 0.1 MG tablet Take 1 tablet (0.1 mg total) by mouth at bedtime. 12/12/22   Verlon Au, NP  cyclobenzaprine (FLEXERIL) 5 MG tablet Take 5 mg by mouth 3 (three) times daily as needed. 10/17/22   [provider]  DULoxetine (CYMBALTA) 60 MG capsule Take 1 capsule (60 mg total) by mouth daily. 01/04/23   Steele Sizer, MD  LORazepam (ATIVAN) 0.5 MG tablet Take 1 tablet (0.5 mg  total) by mouth every 12 (twelve) hours as needed for anxiety. 10/21/22   Cammie Sickle, MD  meclizine (ANTIVERT) 25 MG tablet 1 pill at night as needed; if dizziness not improved can take one pill every 8 hours as needed/tolerated. 01/10/23   Cammie Sickle, MD  meloxicam (MOBIC) 15 MG tablet Take 15 mg by mouth daily. 12/30/22   [provider]  metoprolol succinate (TOPROL-XL) 25 MG 24 hr tablet TAKE 1/2 TABLET(12.5 MG) BY MOUTH DAILY 09/28/22   Steele Sizer, MD  Multiple Vitamin (MULTIVITAMIN WITH MINERALS) TABS tablet Take 1 tablet by mouth daily.    [provider]  nystatin (MYCOSTATIN/NYSTOP) powder Apply 1 Application topically 3 (three) times daily. 09/28/22   Steele Sizer, MD  olaparib (LYNPARZA) 100 MG tablet TAKE 2 TABLETS (200 MG) TWICE A DAY MAY TAKE WITH FOOD TO DECREASE NAUSEA AND VOMITING 01/03/23   Cammie Sickle, MD  omeprazole (PRILOSEC) 20 MG capsule TAKE 1 CAPSULE(20 MG) BY MOUTH DAILY 06/17/22   Delsa Grana, PA-C  ondansetron (ZOFRAN-ODT) 8 MG disintegrating tablet Take 1 tablet (8 mg total) by mouth every 8 (eight) hours as needed for nausea or vomiting. 11/02/21   Cammie Sickle, MD  oxyCODONE (OXY IR/ROXICODONE) 5 MG immediate release tablet Take 1 tablet (5 mg total) by mouth every 8 (eight) hours as needed for severe pain. 01/10/23   Cammie Sickle,  MD  polyethylene glycol powder (GLYCOLAX/MIRALAX) 17 GM/SCOOP powder Take 0.5 Containers by mouth daily as needed for mild constipation or moderate constipation. 02/17/20   [provider]  pregabalin (LYRICA) 150 MG capsule Take 150 mg by mouth 3 (three) times daily.    E, Tanner Cyprus, PA-C  prochlorperazine (COMPAZINE) 10 MG tablet Take 1 tablet (10 mg total) by mouth every 6 (six) hours as needed for nausea or vomiting. 06/17/22   Earna Coder, MD  rosuvastatin (CRESTOR) 5 MG tablet Take 1 tablet (5 mg total) by mouth daily. In place of Atorvastatin 01/10/23    [name], MD  senna (SENOKOT) 8.6 MG tablet Take 1 tablet by mouth daily as needed for constipation.    [provider]  SUMAtriptan (IMITREX) 100 MG tablet Take 1 tablet (100 mg total) by mouth every 2 (two) hours as needed for migraine. May repeat in 2 hours if headache persists or recurs. 06/17/22   Danelle Berry, PA-C  tiotropium (SPIRIVA HANDIHALER) 18 MCG inhalation capsule Place 1 capsule (18 mcg total) into inhaler and inhale daily. 01/04/23   [name], MD  tirzepatide [facility]) 5 MG/0.5ML Pen Inject 5 mg into the skin once a week. 01/04/23   [name], MD  traZODone (DESYREL) 150 MG tablet Take 1 tablet (150 mg total) by mouth at bedtime as needed for sleep. 01/05/23   Borders, [name], NP  valACYclovir (VALTREX) 1000 MG tablet Take 1 tablet (1,000 mg total) by mouth 2 (two) times daily as needed. 06/17/22   Danelle Berry, PA-C  Varenicline Tartrate, Starter, 0.5 MG X 11 & 1 MG X 42 TBPK Take by mouth as directed. 01/02/23   [provider]  ranitidine (ZANTAC) 300 MG tablet Take 1 tablet (300 mg total) by mouth daily as needed for heartburn. 04/18/18 10/24/19  [name], MD      Allergies    Hydroxyzine hcl    Review of Systems   Review of Systems  All other systems reviewed and are negative.   Physical Exam Updated Vital Signs BP (!) 134/95   Pulse 63   Temp 97.6 F (36.4 C)   Resp 18   LMP 06/19/2017   SpO2 100%  Physical Exam Vitals and nursing note reviewed.  Constitutional:      General: She is not in acute distress.    Appearance: Normal appearance. She is well-developed. She is not toxic-appearing.  HENT:     Head: Normocephalic and atraumatic.  Eyes:     General: Lids are normal.     Conjunctiva/sclera: Conjunctivae normal.     Pupils: Pupils are equal, round, and reactive to light.  Neck:     Thyroid: No thyroid mass.     Trachea: No tracheal deviation.  Cardiovascular:     Rate and Rhythm: Normal rate and regular  rhythm.     Heart sounds: Normal heart sounds. No murmur heard.    No gallop.  Pulmonary:     Effort: Pulmonary effort is normal. No respiratory distress.     Breath sounds: Normal breath sounds. No stridor. No decreased breath sounds, wheezing, rhonchi or rales.  Abdominal:     General: There is no distension.     Palpations: Abdomen is soft.     Tenderness: There is no abdominal tenderness. There is no rebound.  Musculoskeletal:        General: No tenderness. Normal range of motion.     Cervical back: Normal range of motion and neck supple.  Skin:    General: Skin is warm and dry.     Findings: No abrasion or rash.  Neurological:     General: No focal deficit present.     Mental Status: She is alert and oriented to person, place, and time. Mental status is at baseline.     GCS: GCS eye subscore is 4. GCS verbal subscore is 5. GCS motor subscore is 6.     Cranial Nerves: No cranial nerve deficit.     Sensory: No sensory deficit.     Motor: Motor function is intact.  Psychiatric:        Attention and Perception: Attention normal.        Speech: Speech normal.        Behavior: Behavior normal.     ED Results / Procedures / Treatments   Labs (all labs ordered are listed, but only abnormal results are displayed) Labs Reviewed - No data to display  EKG None  Radiology MR BRAIN W WO CONTRAST  Result Date: 01/10/2023 CLINICAL DATA:  headaches/dizzyness/ovarian cancer EXAM: MRI HEAD WITHOUT AND WITH CONTRAST TECHNIQUE: Multiplanar, multiecho pulse sequences of the brain and surrounding structures were obtained without and with intravenous contrast. CONTRAST:  22mL GADAVIST GADOBUTROL 1 MMOL/ML IV SOLN COMPARISON:  MRI with contrast 08/20/2020. FINDINGS: Brain: No acute infarction, hemorrhage, hydrocephalus, extra-axial collection or mass lesion. No pathologic enhancement. Vascular: On postcontrast imaging, linear filling defect within the right jugular bulb, distal right transverse  and sigmoid sinuses. This filling defect was not present on the prior. Skull and upper cervical spine: Normal marrow signal. Sinuses/Orbits: Negative. IMPRESSION: 1. 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 No adjacent brain edema and no evidence of acute infarct. 2. Otherwise, no evidence of acute intracranial abnormality or metastatic disease. These results will be called to the ordering clinician or representative by the Radiologist Assistant, and communication documented in the PACS or [employer]. Electronically Signed   By: Feliberto Harts M.D.   On: 01/10/2023 17:55    Procedures Procedures    Medications Ordered in ED Medications - No data to display  ED Course/ Medical Decision Making/ A&P                             Medical Decision Making  Discussed case with oncologist on-call, Dr. Clelia Croft, states that it is okay for patient heparinized treatment of the patient's venous sinus thrombosis.  I discussed the case with the neurologist on-call who also agrees with that 2.  He requested the patient be sent to [facility] for further management.  Case discussed with patient and family at bedside and they are agreeable to this.  Will consult hospitalist for admission        Final Clinical Impression(s) / ED Diagnoses Final diagnoses:  None    Rx / DC Orders ED Discharge Orders     None         Lorre Nick, MD 01/10/23 1958

## 2023-01-10 NOTE — Patient Instructions (Signed)

## 2023-01-10 NOTE — H&P (Signed)
History and Physical    Patient: [name]:[id number] DOB: [birth year] DOA: 01/10/2023 DOS: the patient was seen and examined on 01/10/2023 PCP: [name], MD  Patient coming from: Home  Chief Complaint:  Chief Complaint  Patient presents with   Abnormal CT   HPI: [name] is a 52 y.o. 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  Headache has persisted.  It is worse when she is lying flat.  Not relieved much by other medications.  Patient is followed by palliative care.  In the ER she had MRI done that showed nonocclusive dural venous sinus thrombosis in the distal right transverse sinus, sigmoid sinus and jugular bulb.  No acute stroke.  No focal deficit.  Neurology consulted with recommendation for statin heparin drip.  Recommend transition to full dose Lovenox in the day or so.  After about a week initiate Pradaxa twice a day and have neurology follow-up.  Patient is being admitted to the hospital for evaluation and treatment at [facility].  Review of Systems: As mentioned in the history of present illness. All other systems reviewed and are negative. 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   Depression    Drug-induced androgenic alopecia    Dyslipidemia 05/17/2022   Dysrhythmia    Family history of breast cancer    GERD (gastroesophageal reflux disease)    Goals of care, counseling/discussion 12/16/2019   Hypertension    Insomnia    Menorrhagia    Migraines     Osteoarthritis    back   Ovarian cancer (HCC) 12/10/2019   Personal history of chemotherapy    ovarian cancer   Plantar fasciitis    Past Surgical History:  Procedure Laterality Date   ABDOMINAL HYSTERECTOMY  03/2020   APPENDECTOMY     LSC but "ruptured when they did the surgery"   BREAST BIOPSY Left 01/04/2021   MRI BX   CESAREAN SECTION     CYSTOSCOPY N/A 04/08/2020   Procedure: CYSTOSCOPY;  Surgeon: Artelia Laroche, MD;  [name];  Service: Gynecology;  [name];   INSERTION OF MESH N/A 04/26/2021   Procedure: INSERTION OF MESH;  Surgeon: Earline Mayotte, MD;  [name];  Service: General;  [name];   IR THORACENTESIS ASP PLEURAL SPACE W/IMG GUIDE  12/06/2019   IUD REMOVAL N/A 04/08/2020   Procedure: INTRAUTERINE DEVICE (IUD) REMOVAL;  Surgeon: Artelia Laroche, MD;  [name];  Service: Gynecology;  [name];   PARACENTESIS     x6   PORTA CATH INSERTION N/A 04/23/2020   Procedure: PORTA CATH INSERTION;  Surgeon: Annice Needy, MD;  Location: [facility] INVASIVE CV LAB;  Service: Cardiovascular;  [name];   TUBAL LIGATION     at time of CSxn   VENTRAL HERNIA REPAIR N/A 04/26/2021   Procedure: HERNIA REPAIR VENTRAL ADULT;  Surgeon: Earline Mayotte, MD;  [name];  Service: General;  [name];  need RNFA for the case  WRIST SURGERY Left 11/21/2016   plates and screws inserted   Social History:  reports that she has been smoking cigarettes. She has a 30.00 pack-year smoking history. She has never used smokeless tobacco. She reports that she does not currently use alcohol. She reports that she does not use drugs.  Allergies  Allergen Reactions   Hydroxyzine Hcl Other (See Comments)    Makes her loopy/jittery    Family History  Adopted: Yes  Problem Relation Age of Onset   Lung cancer Father        deceased 24   Breast cancer Mother 31       currently 100   Colon cancer Mother    ADD /  ADHD Son    ADD / ADHD Son    Early death Maternal Aunt    Breast cancer Maternal Aunt 34       deceased 73   Breast cancer Maternal Grandmother    Depression Daughter    Depression Daughter    Prostate cancer Paternal Uncle    Stroke Paternal Uncle    Leukemia Paternal Aunt    Breast cancer Paternal Grandmother    Cancer Maternal Uncle     Prior to Admission medications   Medication Sig Start Date End Date Taking? Authorizing Provider  buPROPion (WELLBUTRIN XL) 300 MG 24 hr tablet Take 1 tablet (300 mg total) by mouth daily. 01/04/23   [name], MD  cariprazine (VRAYLAR) 1.5 MG capsule Take 1 capsule (1.5 mg total) by mouth daily. 01/04/23   [name], MD  cloNIDine (CATAPRES - DOSED IN MG/24 HR) 0.3 mg/24hr patch APPLY 1 PATCH(0.3 MG) TOPICALLY TO THE SKIN 1 TIME A WEEK 12/01/22   Alinda Dooms, NP  cloNIDine (CATAPRES) 0.1 MG tablet Take 1 tablet (0.1 mg total) by mouth at bedtime. 12/12/22   Alinda Dooms, NP  cyclobenzaprine (FLEXERIL) 5 MG tablet Take 5 mg by mouth 3 (three) times daily as needed. 10/17/22   [provider]  DULoxetine (CYMBALTA) 60 MG capsule Take 1 capsule (60 mg total) by mouth daily. 01/04/23   [name], MD  LORazepam (ATIVAN) 0.5 MG tablet Take 1 tablet (0.5 mg total) by mouth every 12 (twelve) hours as needed for anxiety. 10/21/22   Earna Coder, MD  meclizine (ANTIVERT) 25 MG tablet 1 pill at night as needed; if dizziness not improved can take one pill every 8 hours as needed/tolerated. 01/10/23   Earna Coder, MD  meloxicam (MOBIC) 15 MG tablet Take 15 mg by mouth daily. 12/30/22   [provider]  metoprolol succinate (TOPROL-XL) 25 MG 24 hr tablet TAKE 1/2 TABLET(12.5 MG) BY MOUTH DAILY 09/28/22   [name], MD  Multiple Vitamin (MULTIVITAMIN WITH MINERALS) TABS tablet Take 1 tablet by mouth daily.    [provider]  nystatin (MYCOSTATIN/NYSTOP) powder Apply 1 Application topically 3  (three) times daily. 09/28/22   [name], MD  olaparib (LYNPARZA) 100 MG tablet TAKE 2 TABLETS (200 MG) TWICE A DAY MAY TAKE WITH FOOD TO DECREASE NAUSEA AND VOMITING 01/03/23   Earna Coder, MD  omeprazole (PRILOSEC) 20 MG capsule TAKE 1 CAPSULE(20 MG) BY MOUTH DAILY 06/17/22   Danelle Berry, PA-C  ondansetron (ZOFRAN-ODT) 8 MG disintegrating tablet Take 1 tablet (8 mg total) by mouth every 8 (eight) hours as needed for nausea or vomiting. 11/02/21   Earna Coder, MD  oxyCODONE (OXY IR/ROXICODONE) 5 MG immediate release tablet Take 1 tablet (5 mg total)  by mouth every 8 (eight) hours as needed for severe pain. 01/10/23   Earna Coder, MD  polyethylene glycol powder (GLYCOLAX/MIRALAX) 17 GM/SCOOP powder Take 0.5 Containers by mouth daily as needed for mild constipation or moderate constipation. 02/17/20   [provider]  pregabalin (LYRICA) 150 MG capsule Take 150 mg by mouth 3 (three) times daily.    E, Tanner Cyprus, PA-C  prochlorperazine (COMPAZINE) 10 MG tablet Take 1 tablet (10 mg total) by mouth every 6 (six) hours as needed for nausea or vomiting. 06/17/22   Earna Coder, MD  rosuvastatin (CRESTOR) 5 MG tablet Take 1 tablet (5 mg total) by mouth daily. In place of Atorvastatin 01/10/23   [name], MD  senna (SENOKOT) 8.6 MG tablet Take 1 tablet by mouth daily as needed for constipation.    [provider]  SUMAtriptan (IMITREX) 100 MG tablet Take 1 tablet (100 mg total) by mouth every 2 (two) hours as needed for migraine. May repeat in 2 hours if headache persists or recurs. 06/17/22   Danelle Berry, PA-C  tiotropium (SPIRIVA HANDIHALER) 18 MCG inhalation capsule Place 1 capsule (18 mcg total) into inhaler and inhale daily. 01/04/23   [name], MD  tirzepatide [facility]) 5 MG/0.5ML Pen Inject 5 mg into the skin once a week. 01/04/23   [name], MD  traZODone (DESYREL) 150 MG tablet Take 1 tablet (150 mg total) by mouth at  bedtime as needed for sleep. 01/05/23   Borders, [name], NP  valACYclovir (VALTREX) 1000 MG tablet Take 1 tablet (1,000 mg total) by mouth 2 (two) times daily as needed. 06/17/22   Danelle Berry, PA-C  Varenicline Tartrate, Starter, 0.5 MG X 11 & 1 MG X 42 TBPK Take by mouth as directed. 01/02/23   [provider]  ranitidine (ZANTAC) 300 MG tablet Take 1 tablet (300 mg total) by mouth daily as needed for heartburn. 04/18/18 10/24/19  [name], MD    Physical Exam: Vitals:   01/10/23 1922 01/10/23 1924 01/10/23 1929  BP: (!) 134/95    Pulse:  63   Resp:   18  Temp:   97.6 F (36.4 C)  SpO2:  100%    Constitutional: Chronically ill looking, NAD, calm, comfortable Eyes: PERRL, lids and conjunctivae normal ENMT: Mucous membranes are moist. Posterior pharynx clear of any exudate or lesions.Normal dentition.  Neck: normal, supple, no masses, no thyromegaly Respiratory: clear to auscultation bilaterally, no wheezing, no crackles. Normal respiratory effort. No accessory muscle use.  Cardiovascular: Regular rate and rhythm, no murmurs / rubs / gallops. No extremity edema. 2+ pedal pulses. No carotid bruits.  Abdomen: Mildly distended, no tenderness, no masses palpated. No hepatosplenomegaly. Bowel sounds positive.  Musculoskeletal: Good range of motion, no joint swelling or tenderness, Skin: no rashes, lesions, ulcers. No induration Neurologic: CN 2-12 grossly intact. Sensation intact, DTR normal. Strength 5/5 in all 4.  Psychiatric: Normal judgment and insight. Alert and oriented x 3. Normal mood  Data Reviewed:  Temperature 96.7, blood pressure 134/95, pulse 65 respiratory 18 oxygen sat 100% room air.  Platelets 127 hemoglobin 15.3 and white count 5.3.MRI of the brain showed findings concerning for nonocclusive dural venous sinus thrombosis in the distal right transverse sinus.  Assessment and Plan:  #1 dural sinus thrombosis: Patient will be admitted.  Initiated on heparin.   Supportive care.  Neurology to see patient.  Oncology also following.  #2 serous adenocarcinoma: Unknown primary.  Suspected peritoneal.  Continue with oncology.  Patient  has had hospice palliative care consultation in the outpatient setting.  #3 GERD: Will continue PPIs.  #4 persistent headache: Secondary to #1.  Continue to monitor  #5 hyperlipidemia: Continue with statin  #6 depression with anxiety: Confirm on continue home regimen  #7 morbid obesity: Dietary counseling     Advance Care Planning:   Code Status: Full Code   Consults: Neurology Dr. Patrecia Pace  Family Communication: No family at bedside  Severity of Illness: The appropriate patient status for this patient is INPATIENT. Inpatient status is judged to be reasonable and necessary in order to provide the required intensity of service to ensure the patient's safety. The patient's presenting symptoms, physical exam findings, and initial radiographic and laboratory data in the context of their chronic comorbidities is felt to place them at high risk for further clinical deterioration. Furthermore, it is not anticipated that the patient will be medically stable for discharge from the hospital within 2 midnights of admission.   * I certify that at the point of admission it is my clinical judgment that the patient will require inpatient hospital care spanning beyond 2 midnights from the point of admission due to high intensity of service, high risk for further deterioration and high frequency of surveillance required.*  AuthorLonia Blood, MD 01/10/2023 8:34 PM  For on call review [email or web address].

## 2023-01-10 NOTE — Telephone Encounter (Signed)
  Chief Complaint: Side effects from Atorvastatin Symptoms: Unstable, feels wobbly, ears ringing Frequency: When she takes medication Pertinent Negatives: Patient denies  Disposition: [] ED /[] Urgent Care (no appt availability in office) / [] Appointment(In office/virtual)/ []  [facility]/ [] Home Care/ [] Refused Recommended Disposition /[] [facility]/ [x]  Follow-up with PCP Additional Notes: PT reports that when she takes the Atorvastatin, in the morning, she feels "wobbly" her ears ring to the point she cannot hear, She feels like her body is drunk. Pt nearly fell. Pt did not take the medication one night and did not experience these issues.  PT would like a different medication prescribed.   PT would like 90 day supply if possible.   Summary: Dizziness, almost fell bc of Rx   Pt called reporting reaction symptoms to her new medication Atorvastatin. She says she feels drunk, dizzy, woozy, has balance issues, almost fell this morning. Approved to send high priority clinical call from RN     Reason for Disposition  [1] Caller has URGENT medicine question about med that PCP or specialist prescribed AND [2] triager unable to answer question  Answer Assessment - Initial Assessment Questions 1. NAME of MEDICINE: "What medicine(s) are you calling about?"     Atorvastatin 2. QUESTION: "What is your question?" (e.g., double dose of medicine, side effect)     Side effects 3. PRESCRIBER: "Who prescribed the medicine?" Reason: if prescribed by specialist, call should be referred to that group.     Dr. Ancil Boozer 4. SYMPTOMS: "Do you have any symptoms?" If Yes, ask: "What symptoms are you having?"  "How bad are the symptoms (e.g., mild, moderate, severe)     Side effects - Wobbly, ears ringing, Almost fell 5. PREGNANCY:  "Is there any chance that you are pregnant?" "When was your last menstrual period?"  Protocols used: Medication Question Call-A-AH

## 2023-01-10 NOTE — Progress Notes (Signed)
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   Allergies  Allergen Reactions   Hydroxyzine Hcl Other (See Comments)    Makes her loopy/jittery    Patient Measurements:   Heparin Dosing Weight: 74.5 kg  Vital Signs: Temp: 97.6 F (36.4 C) (01/16 1929) Temp Source: Tympanic (01/16 1300) BP: 134/95 (01/16 1922) Pulse Rate: 63 (01/16 1924)  Labs: Recent Labs    01/10/23 1255  HGB 15.3*  HCT 44.9  PLT 127*  CREATININE 0.83    Estimated Creatinine Clearance: 89.1 mL/min (by C-G formula based on SCr of 0.83 mg/dL).   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 Per neuro note, no acute stroke, no focal deficit per discussion with ED team at [facility].   Goal of Therapy:  Heparin level 0.3-0.7 units/ml Monitor platelets by anticoagulation protocol:  Yes   Plan:  STAT baseline aPTT and PT/INR 5000 units heparin IV bolus per infusion then IV heparin 1250 units/hr continuous infusion 6 hour heparin level Monitor daily heparin level, CBC, signs/symptoms of bleeding   Thank you for allowing pharmacy to be a part of this patient's care.  Selinda Eon, PharmD, BCPS Clinical Pharmacist [place] Please utilize Amion for appropriate phone number to reach the unit pharmacist [facility]) 01/10/2023 8:12 PM

## 2023-01-11 ENCOUNTER — Encounter: Payer: Self-pay | Admitting: Internal Medicine

## 2023-01-11 ENCOUNTER — Telehealth: Payer: Self-pay | Admitting: *Deleted

## 2023-01-11 ENCOUNTER — Other Ambulatory Visit (HOSPITAL_COMMUNITY): Payer: Self-pay

## 2023-01-11 DIAGNOSIS — E785 Hyperlipidemia, unspecified: Secondary | ICD-10-CM | POA: Diagnosis not present

## 2023-01-11 DIAGNOSIS — K219 Gastro-esophageal reflux disease without esophagitis: Secondary | ICD-10-CM | POA: Diagnosis not present

## 2023-01-11 DIAGNOSIS — G08 Intracranial and intraspinal phlebitis and thrombophlebitis: Secondary | ICD-10-CM | POA: Diagnosis not present

## 2023-01-11 LAB — CA 125: Cancer Antigen (CA) 125: 42 U/mL — ABNORMAL HIGH (ref 0.0–38.1)

## 2023-01-11 LAB — CBC
HCT: 38.9 % (ref 36.0–46.0)
Hemoglobin: 13.1 g/dL (ref 12.0–15.0)
MCH: 38.3 pg — ABNORMAL HIGH (ref 26.0–34.0)
MCHC: 33.7 g/dL (ref 30.0–36.0)
MCV: 113.7 fL — ABNORMAL HIGH (ref 80.0–100.0)
Platelets: 99 10*3/uL — ABNORMAL LOW (ref 150–400)
RBC: 3.42 MIL/uL — ABNORMAL LOW (ref 3.87–5.11)
RDW: 14.7 % (ref 11.5–15.5)
WBC: 5.7 10*3/uL (ref 4.0–10.5)
nRBC: 0 % (ref 0.0–0.2)

## 2023-01-11 LAB — COMPREHENSIVE METABOLIC PANEL
ALT: 10 U/L (ref 0–44)
AST: 17 U/L (ref 15–41)
Albumin: 3.2 g/dL — ABNORMAL LOW (ref 3.5–5.0)
Alkaline Phosphatase: 37 U/L — ABNORMAL LOW (ref 38–126)
Anion gap: 7 (ref 5–15)
BUN: 11 mg/dL (ref 6–20)
CO2: 29 mmol/L (ref 22–32)
Calcium: 8.9 mg/dL (ref 8.9–10.3)
Chloride: 105 mmol/L (ref 98–111)
Creatinine, Ser: 0.79 mg/dL (ref 0.44–1.00)
GFR, Estimated: >60 mL/min (ref >60)
Glucose, Bld: 112 mg/dL — ABNORMAL HIGH (ref 70–99)
Potassium: 3.5 mmol/L (ref 3.5–5.1)
Sodium: 141 mmol/L (ref 135–145)
Total Bilirubin: 0.6 mg/dL (ref 0.3–1.2)
Total Protein: 5.5 g/dL — ABNORMAL LOW (ref 6.5–8.1)

## 2023-01-11 LAB — HEPARIN LEVEL (UNFRACTIONATED)
Heparin Unfractionated: 0.95 IU/mL — ABNORMAL HIGH (ref 0.30–0.70)
Heparin Unfractionated: 0.94 IU/mL — ABNORMAL HIGH (ref 0.30–0.70)

## 2023-01-11 MED ORDER — ALUM & MAG HYDROXIDE-SIMETH 200-200-20 MG/5ML PO SUSP
15.0000 mL | ORAL | Status: DC | PRN
  Administered 2023-01-11 (×2): 15 mL via ORAL
  Filled 2023-01-11 (×2): qty 30

## 2023-01-11 MED ORDER — HEPARIN (PORCINE) 25000 UT/250ML-% IV SOLN
900.0000 [IU]/h | INTRAVENOUS | Status: DC
  Administered 2023-01-11: 900 [IU]/h via INTRAVENOUS

## 2023-01-11 MED ORDER — DULOXETINE HCL 60 MG PO CPEP
60.0000 mg | ORAL_CAPSULE | Freq: Every day | ORAL | Status: DC
  Administered 2023-01-11 – 2023-01-12 (×2): 60 mg via ORAL
  Filled 2023-01-11: qty 1
  Filled 2023-01-11: qty 2

## 2023-01-11 MED ORDER — ROSUVASTATIN CALCIUM 5 MG PO TABS
5.0000 mg | ORAL_TABLET | Freq: Every day | ORAL | Status: DC
  Administered 2023-01-11 – 2023-01-12 (×2): 5 mg via ORAL
  Filled 2023-01-11 (×2): qty 1

## 2023-01-11 MED ORDER — ENOXAPARIN SODIUM 100 MG/ML IJ SOSY
1.0000 mg/kg | PREFILLED_SYRINGE | Freq: Two times a day (BID) | INTRAMUSCULAR | Status: DC
  Administered 2023-01-11 – 2023-01-12 (×2): 95 mg via SUBCUTANEOUS
  Filled 2023-01-11 (×2): qty 0.95
  Filled 2023-01-11: qty 1

## 2023-01-11 NOTE — Consult Note (Signed)
Neurology Consultation Reason for Consult: Dural venous sinus thormbosis Referring Physician: Colin Benton  CC: Headahce  History is obtained from:Patient  HPI: [name] is a 52 y.o. female with a history of headaches which have been gradually getting worse.  She first noticed them several months ago, but over the past few weeks they have gotten markedly worse.  She states that they are holocephalic but worse on the back, and much worse when she lays down and when she is sitting up or standing.    She also complains of some right arm weakness which has been present for for 5 months.  She states it often goes to sleep.  It is a combination of pain as well as weakness that is limiting, and there is no numbness associated with it.  This was gradually progressive over the first month she noticed it, but it has not been advancing for quite some time.  Due to the headache an MRI was obtained in the emergency department which demonstrates a nonocclusive dural venous sinus thrombosis.   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   Depression    Drug-induced androgenic alopecia    Dyslipidemia 05/17/2022   Dysrhythmia    Family history of breast cancer    GERD (gastroesophageal reflux disease)    Goals of care, counseling/discussion 12/16/2019   Hypertension    Insomnia    Menorrhagia    Migraines    Osteoarthritis    back   Ovarian cancer (HCC) 12/10/2019   Personal history of chemotherapy    ovarian cancer   Plantar fasciitis     Family History  Adopted: Yes  Problem Relation Age of Onset   Lung cancer Father        deceased 65   Breast cancer Mother 30       currently 31   Colon cancer Mother    ADD / ADHD Son    ADD / ADHD Son    Early death Maternal  Aunt    Breast cancer Maternal Aunt 34       deceased 38   Breast cancer Maternal Grandmother    Depression Daughter    Depression Daughter    Prostate cancer Paternal Uncle    Stroke Paternal Uncle    Leukemia Paternal Aunt    Breast cancer Paternal Grandmother    Cancer Maternal Uncle      Social History:  reports that she has been smoking cigarettes. She has a 30.00 pack-year smoking history. She has never used smokeless tobacco. She reports that she does not currently use alcohol. She reports that she does not use drugs.   Exam: Current vital signs: BP 105/71   Pulse (!) 57   Temp 97.7 F (36.5 C) (Oral)   Resp 18   LMP 06/19/2017   SpO2 100%  Vital signs in last 24 hours: Temp:  [96.7 F (35.9 C)-97.8 F (36.6 C)] 97.7 F (36.5 C) (01/17 0739) Pulse Rate:  [57-70] 57 (01/17 0740) Resp:  [15-20] 18 (01/17 0740) BP: (91-134)/(70-95) 105/71 (01/17 0740) SpO2:  [93 %-100 %] 100 % (01/17 0740) Weight:  [95.4 kg] 95.4 kg (01/16 1300)   Physical Exam  Appears well-developed and well-nourished.   Neuro: Mental Status:  Patient is awake, alert, oriented to person, place, month, year, and situation. Patient is able to give a clear and coherent history. No signs of aphasia or neglect Cranial Nerves: II: Visual Fields are full. Pupils are equal, round, and reactive to light.   III,IV, VI: EOMI without ptosis or diploplia.  V: Facial sensation is symmetric to temperature VII: Facial movement is symmetric.  VIII: hearing is intact to voice X: Uvula elevates symmetrically XI: Shoulder shrug is symmetric. XII: tongue is midline without atrophy or fasciculations.  Motor: Tone is normal. Bulk is normal. 5/5 strength was present in left arm and bilateral legs, she has some limitation due to pain in her right upper extremity but least 4/5 if not full. Sensory: Sensation is symmetric to light touch and temperature in the arms and legs. Deep Tendon Reflexes: 2+ and symmetric  in the br and patellae.  Plantars: Toes are downgoing bilaterally.  Cerebellar: FNF intact bilaterally    I have reviewed labs in epic and the results pertinent to this consultation are: Creatinine 0.79  I have reviewed the images obtained: MRI with and without contrast-filling defect in the venous sinus most consistent with nonocclusive clot  Impression: 52 year old female with positional (worse with laying down) headache progressive over several months with a history of both DVT and cancer.  In the setting, this is very consistent with venous sinus thrombosis and coupled with the imaging also consistent with that, I would favor treating it as such at this time.  I do not feel there would be any value of further diagnostic studies, and would favor continuing anticoagulation.  Recommendations: 1) changed to full dose twice daily Lovenox for 5 days 2) following Lovenox, start Pradaxa 150 mg twice daily 3) no further diagnostic studies at this time, therefore no need to transfer to [facility]. 4) she will need outpatient follow-up with neurology both for her DVST as well as consideration of EMG for her right arm 5) neurology will be available as needed.   Ritta Slot, MD [facility] [phone]  If 7pm- 7am, please page neurology on call as listed in AMION.

## 2023-01-11 NOTE — Progress Notes (Signed)
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   Allergies  Allergen Reactions   Hydroxyzine Hcl Other (See Comments)    Makes her loopy/jittery    Patient Measurements:   Heparin Dosing Weight: 74.5 kg  Vital Signs: Temp: 97.8 F (36.6 C) (01/17 0308) BP: 110/86 (01/17 0305) Pulse Rate: 70 (01/17 0305)  Labs: Recent Labs    01/10/23 1255 01/10/23 2044 01/10/23 2157 01/11/23 0412  HGB 15.3*  --   --   --   HCT 44.9  --   --   --   PLT 127*  --   --   --   APTT  --  >200* >200*  --   LABPROT  --  13.8 14.3  --   INR  --  1.1 1.1  --   HEPARINUNFRC  --   --   --  0.95*  CREATININE 0.83  --   --   --      Estimated Creatinine Clearance: 89.1 mL/min (by C-G formula based on SCr of 0.83 mg/dL).   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 Per neuro note,  no acute stroke, no focal deficit per discussion with ED team at [facility].    1/17 AM update:  Heparin level supra-therapeutic   Goal of Therapy:  Heparin level 0.3-0.7 units/ml Monitor platelets by anticoagulation protocol: Yes   Plan:  Dec heparin to 1050 units/hr Re-check heparin level in 8 hours  Abran Duke, PharmD, BCPS Clinical Pharmacist Phone: [phone]

## 2023-01-11 NOTE — Telephone Encounter (Signed)
Called report, I see that she is in the ER IMPRESSION: 1. 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 No adjacent brain edema and no evidence of acute infarct. 2. Otherwise, no evidence of acute intracranial abnormality or metastatic disease.  These results will be called to the ordering clinician or representative by the Radiologist Assistant, and communication documented in the PACS or [employer].   Electronically Signed By: Feliberto Harts M.D. On: 01/10/2023 17:55

## 2023-01-11 NOTE — TOC Benefit Eligibility Note (Signed)
Patient Product/process development scientist completed.    The patient is currently admitted and upon discharge could be taking Xarelto.  The current 30 day co-pay is $11.20.     Patient Product/process development scientist completed.    The patient is currently admitted and upon discharge could be taking Eliquis.  The current 30 day co-pay is $11.20.     Patient Product/process development scientist completed.    The patient is currently admitted and upon discharge could be taking Dabigatran  .  The current 30 day co-pay is Non-Formulary.   The patient is insured through [employer]

## 2023-01-11 NOTE — Progress Notes (Addendum)
ANTICOAGULATION CONSULT NOTE - Follow Up Consult  Pharmacy Consult for Heparin IV Indication: nonocclusive dural venous sinus thrombosis.   Allergies  Allergen Reactions   Hydroxyzine Hcl Other (See Comments)    Makes her loopy/jittery    Patient Measurements:   Wt 95.4 kg Heparin Dosing Weight: 74 kg  Vital Signs: Temp: 97.7 F (36.5 C) (01/17 1134) Temp Source: Oral (01/17 1134) BP: 114/80 (01/17 1000) Pulse Rate: 59 (01/17 1000)  Labs: Recent Labs    01/10/23 1255 01/10/23 2044 01/10/23 2157 01/11/23 0412  HGB 15.3*  --   --  13.1  HCT 44.9  --   --  38.9  PLT 127*  --   --  99*  APTT  --  >200* >200*  --   LABPROT  --  13.8 14.3  --   INR  --  1.1 1.1  --   HEPARINUNFRC  --   --   --  0.95*  CREATININE 0.83  --   --  0.79    Estimated Creatinine Clearance: 92.4 mL/min (by C-G formula based on SCr of 0.79 mg/dL).   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 Per neuro note, no acute stroke, no focal deficit per discussion with ED team at [facility].    PMH significant for previous DVT (12/2019) treated with Xarelto.    Today, 01/11/2023: Heparin level 0.94, remains supra-therapeutic on heparin 1050 units/hr despite previous rate decrease.   - RN confirmed lab was drawn from opposite side as the heparin infusion.  CBC: Hgb 13.1, Plt decreased to 99 (baseline 110-130's) No bleeding or complications reported.     Goal of Therapy:  Heparin level 0.3-0.7 units/ml Monitor platelets by anticoagulation protocol: Yes   Plan:  Decrease to heparin IV infusion at 900 units/hr Heparin level in 6 hours.  Daily heparin level and CBC.  Per Neurology notes, OK to change to Lovenox x5  days and then to Pradaxa.  Patient's insurance will cover either Eliquis or Xarelto for $11 copay, but Pradaxa is non-formulary.  Follow up with neurology about long-term anticoagulation plans.   Lynann Beaver PharmD, BCPS [facility] main pharmacy [phone] 01/11/2023 12:07 PM   Addendum: Change to Lovenox 1 mg/kg sq q12h D/C heparin and start the Lovenox at the same time.   Lynann Beaver PharmD, BCPS [facility] main pharmacy [phone] 01/11/2023 5:39 PM

## 2023-01-11 NOTE — Progress Notes (Signed)
[facility]  PROGRESS NOTE  [name] PTW:[id number] DOB: September 28, 1971 DOA: 01/10/2023 PCP: [name], MD   Brief HPI:    52 y.o. 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  Neurology consulted with recommendation for stat heparin drip.  Recommend transition to full dose Lovenox in the day or so. After about a week initiate Pradaxa twice a day and have neurology follow-up. Patient is being admitted to the hospital for evaluation and treatment at [facility].    Subjective   Patient seen and examined, denies chest pain or shortness of breath.  Complains of headache.   Assessment/Plan:     #1 Dural sinus thrombosis:  -likely in setting of malignancy -Started on  heparin.   -Neurology consulted. - Oncology also following. 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   -followed by  oncology.   -Patient has had hospice palliative care consultation in the outpatient setting.   #3 GERD:  -Will continue PPIs. -add maalox prn   #4 persistent headache:  -Secondary to #1.   -continue to monitor   #5 hyperlipidemia:  -Continue with statin   #6 depression with anxiety:  - continue home regimen   #7 morbid obesity:  -Dietary counseling  #8 Hypertension - Patient takes metoprolol at home - metoprolol  is on hold due to soft bp      Medications      Data Reviewed:   CBG:  No results for input(s): "GLUCAP" in the last 168 hours.  SpO2: 100 %     Vitals:   01/11/23 0308 01/11/23 0700 01/11/23 0739 01/11/23 0740  BP:    105/71  Pulse:  (!) 59  (!) 57  Resp:  15  18  Temp: 97.8 F (36.6 C)  97.7 F (36.5 C)   TempSrc:   Oral   SpO2:  94%  100%      Data Reviewed:  Basic Metabolic Panel: Recent Labs  Lab 01/05/23 0945 01/10/23 1255 01/11/23 0412  NA 139 137 141  K 3.6 3.6 3.5  CL 106 104 105  CO2 24 25 29   GLUCOSE 103* 112* 112*  BUN 10 12 11   CREATININE 0.71 0.83 0.79  CALCIUM 9.3 9.0 8.9    CBC: Recent Labs  Lab 01/05/23 0945 01/10/23 1255 01/11/23 0412  WBC 3.8* 5.2 5.7  NEUTROABS 1.5* 2.7  --   HGB 15.0 15.3* 13.1  HCT 43.4 44.9 38.9  MCV 110.2* 110.3* 113.7*  PLT 112* 127* 99*    LFT Recent Labs  Lab 01/05/23 0945 01/10/23 1255 01/11/23 0412  AST 25 19 17   ALT 12 13 10   ALKPHOS 51 49 37*  BILITOT 0.6 0.8 0.6  PROT 6.8 6.9 5.5*  ALBUMIN 3.7 3.9 3.2*     Antibiotics: Anti-infectives (From admission, onward)    None        DVT prophylaxis: Heparin  Code Status: Full code  Family Communication:    CONSULTS Neurology   Objective    Physical Examination:   General: Appears in no acute distress Cardiovascular: S1-S2,  regular Respiratory: Clear to auscultation bilaterally Abdomen: Abdomen is soft, nontender, no organomegaly Extremities: No edema in the lower extremities Neurologic: Cranial nerves II through XII grossly intact, no focal deficit noted, moving all extremities   Status is: Inpatient: Venous sinus thrombosis      Ajene Carchi S Curby Carswell   [facility] If 7PM-7AM, please contact night-coverage at [email or web address], Office  [phone]   01/11/2023, 7:59 AM  LOS: 1 day

## 2023-01-12 ENCOUNTER — Other Ambulatory Visit (HOSPITAL_COMMUNITY): Payer: Self-pay

## 2023-01-12 ENCOUNTER — Telehealth (HOSPITAL_COMMUNITY): Payer: Self-pay | Admitting: Pharmacy Technician

## 2023-01-12 ENCOUNTER — Encounter: Payer: Self-pay | Admitting: Internal Medicine

## 2023-01-12 DIAGNOSIS — E785 Hyperlipidemia, unspecified: Secondary | ICD-10-CM | POA: Diagnosis not present

## 2023-01-12 DIAGNOSIS — G08 Intracranial and intraspinal phlebitis and thrombophlebitis: Secondary | ICD-10-CM | POA: Diagnosis not present

## 2023-01-12 DIAGNOSIS — K219 Gastro-esophageal reflux disease without esophagitis: Secondary | ICD-10-CM | POA: Diagnosis not present

## 2023-01-12 LAB — HIV ANTIBODY (ROUTINE TESTING W REFLEX): HIV Screen 4th Generation wRfx: NONREACTIVE

## 2023-01-12 MED ORDER — ENOXAPARIN SODIUM 100 MG/ML IJ SOSY
100.0000 mg | PREFILLED_SYRINGE | Freq: Two times a day (BID) | INTRAMUSCULAR | 0 refills | Status: DC
  Filled 2023-01-12: qty 30, 15d supply, fill #0

## 2023-01-12 MED ORDER — RIVAROXABAN (XARELTO) VTE STARTER PACK (15 & 20 MG): ORAL_TABLET | ORAL | 0 refills | Status: DC

## 2023-01-12 MED ORDER — HEPARIN SOD (PORK) LOCK FLUSH 100 UNIT/ML IV SOLN
500.0000 [IU] | INTRAVENOUS | Status: AC | PRN
  Administered 2023-01-12: 500 [IU]
  Filled 2023-01-12: qty 5

## 2023-01-12 MED ORDER — ENOXAPARIN SODIUM 100 MG/ML IJ SOSY: 100.0000 mg | PREFILLED_SYRINGE | Freq: Two times a day (BID) | INTRAMUSCULAR | Status: DC

## 2023-01-12 MED ORDER — CHLORHEXIDINE GLUCONATE CLOTH 2 % EX PADS
6.0000 | MEDICATED_PAD | Freq: Every day | CUTANEOUS | Status: DC
  Administered 2023-01-12: 6 via TOPICAL

## 2023-01-12 NOTE — Telephone Encounter (Signed)
Patient Advocate Encounter   Received notification that prior authorization for Pradaxa 150MG  capsules is required.   PA submitted on 01/12/2023 Key [id number] Status is pending       01/14/2023, CPhT Pharmacy Patient Advocate Specialist [facility] Patient Advocate Team Direct Number: [phone]  Fax: [phone]

## 2023-01-12 NOTE — Discharge Instructions (Addendum)
Information on my medicine - XARELTO (rivaroxaban)  This medication education was reviewed with me or my healthcare representative as part of my discharge preparation.   WHY WAS XARELTO PRESCRIBED FOR YOU? Xarelto was prescribed to treat blood clots that may have been found in the veins of your legs (deep vein thrombosis) or in your lungs (pulmonary embolism) and to reduce the risk of them occurring again.  What do you need to know about Xarelto? After completion of Lovenox, starting 01/27/2023,the starting dose is one 15 mg tablet taken TWICE daily with food for the FIRST 21 DAYS then on 02/16/2023 the dose is changed to one 20 mg tablet taken ONCE A DAY with your evening meal.  DO NOT stop taking Xarelto without talking to the health care provider who prescribed the medication.  Refill your prescription for 20 mg tablets before you run out.  After discharge, you should have regular check-up appointments with your healthcare provider that is prescribing your Xarelto.  In the future your dose may need to be changed if your kidney function changes by a significant amount.  What do you do if you miss a dose? If you are taking Xarelto TWICE DAILY and you miss a dose, take it as soon as you remember. You may take two 15 mg tablets (total 30 mg) at the same time then resume your regularly scheduled 15 mg twice daily the next day.  If you are taking Xarelto ONCE DAILY and you miss a dose, take it as soon as you remember on the same day then continue your regularly scheduled once daily regimen the next day. Do not take two doses of Xarelto at the same time.   Important Safety Information Xarelto is a blood thinner medicine that can cause bleeding. You should call your healthcare provider right away if you experience any of the following: Bleeding from an injury or your nose that does not stop. Unusual colored urine (red or dark brown) or unusual colored stools (red or black). Unusual  bruising for unknown reasons. A serious fall or if you hit your head (even if there is no bleeding).  Some medicines may interact with Xarelto and might increase your risk of bleeding while on Xarelto. To help avoid this, consult your healthcare provider or pharmacist prior to using any new prescription or non-prescription medications, including herbals, vitamins, non-steroidal anti-inflammatory drugs (NSAIDs) and supplements.  This website has more information on Xarelto: VisitDestination.com.br.

## 2023-01-12 NOTE — TOC Benefit Eligibility Note (Signed)
Patient Product/process development scientist completed.    The patient is currently admitted and upon discharge could be taking enoxaparin (Lovenox) 100 mg/ml.  The current 17 day co-pay is $4.50.  Insurance will only pay for 17 days.   The patient is insured through [employer]   Roland Earl, CPHT Pharmacy Patient Advocate Specialist [facility] Patient Advocate Team Direct Number: [phone]  Fax: [phone]

## 2023-01-12 NOTE — TOC Progression Note (Signed)
Transition of Care [facility]) - Progression Note    Patient Details  Name: [name] MRN: [id number] Date of Birth: December 17, 1971  Transition of Care [facility]) CM/SW Contact  Beckie Busing, RN Phone Number:[phone]  01/12/2023, 12:31 PM  Clinical Narrative:     Transition of Care [facility]) Screening Note   Patient Details  Name: [name] Date of Birth: 05/20/1971   Transition of Care [facility]) CM/SW Contact:    Beckie Busing, RN Phone Number: 01/12/2023, 12:31 PM    Transition of Care Department [facility]) has reviewed patient and no TOC needs have been identified at this time. We will continue to monitor patient advancement through interdisciplinary progression rounds. If new patient transition needs arise, please place a TOC consult.          Expected Discharge Plan and Services         Expected Discharge Date: 01/12/23                                     Social Determinants of Health (SDOH) Interventions SDOH Screenings   Food Insecurity: No Food Insecurity (01/11/2023)  Housing: Medium Risk (01/11/2023)  Transportation Needs: No Transportation Needs (01/11/2023)  Utilities: Not At Risk (01/11/2023)  Alcohol Screen: Low Risk  (12/12/2022)  Depression (PHQ2-9): High Risk (01/04/2023)  Financial Resource Strain: High Risk (12/12/2022)  Physical Activity: Inactive (12/12/2022)  Social Connections: Socially Isolated (12/12/2022)  Stress: Stress Concern Present (09/28/2022)  Tobacco Use: High Risk (01/10/2023)    Readmission Risk Interventions     No data to display

## 2023-01-12 NOTE — Discharge Summary (Signed)
Physician Discharge Summary   Patient: [name] MRN: [id number] DOB: 1971/02/25  Admit date:     01/10/2023  Discharge date: 01/12/23  Discharge Physician: Meredeth Ide   PCP: [name], MD   Recommendations at discharge:   Follow-up oncology in 1 week Follow-up PCP in 1 week  Discharge Diagnoses: Principal Problem:   Dural sinus thrombosis Active Problems:   GERD without esophagitis   Dyslipidemia   Dural venous sinus thrombosis  Resolved Problems:   * No resolved hospital problems. *  Hospital Course:  52 y.o. 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  Neurology consulted with recommendation for stat heparin drip.  Recommend transition to full dose Lovenox in the day or so. After about a week initiate Pradaxa twice a day and have neurology follow-up. Patient is being admitted to the hospital for evaluation and treatment at [facility].   Assessment and Plan:  #1 Dural sinus thrombosis:  -likely in setting of malignancy -Started on  heparin.   -Neurology consulted. - Oncology also following. -Discussed with patient's oncologist, Dr Donneta Romberg, he recommends to give Lovenox 100 mg subcu every 12 hours for 15 days and then switch to Xarelto -He will follow patient in clinic in 1 week -Will also give ambulatory referral for neurology in 2 to 3 weeks  #2 serous adenocarcinoma:  -Suspected peritoneal.   -followed by  oncology.   -Patient has had hospice palliative care consultation in the outpatient setting.   #3 GERD:  -Will continue PPIs.    #4  persistent headache:  -Secondary to #1.   -continue as needed oxycodone at home   #5 hyperlipidemia:  -Continue with statin   #6 depression with anxiety:  - continue home regimen   #7 morbid obesity:  -Dietary counseling   #8 Hypertension - Patient takes metoprolol at home - metoprolol  is on hold due to soft bp -Patient states that she has been getting dizzy especially after getting up from bed, will discontinue metoprolol and p.o. clonidine at this time. -Patient already takes Catapres patch for vasomotor symptoms.  She can continue using Catapres patch as prescribed.        Consultants: Neurology Procedures performed:  Disposition: Home Diet recommendation:  Discharge Diet Orders (From admission, onward)     Start     Ordered   01/12/23 0000  Diet - low sodium heart healthy        01/12/23 1141           Regular diet DISCHARGE MEDICATION: Allergies as of 01/12/2023       Reactions   Hydroxyzine Hcl Other (See Comments)   Makes her loopy/jittery        Medication List     STOP taking these medications    cloNIDine 0.1 MG tablet Commonly known as: CATAPRES   meloxicam 15 MG tablet Commonly known as: MOBIC   metoprolol succinate 25 MG 24 hr tablet Commonly known as: TOPROL-XL       TAKE these medications    buPROPion 300 MG 24 hr tablet Commonly known as: Wellbutrin XL Take 1 tablet (300 mg total) by mouth daily.  cariprazine 1.5 MG capsule Commonly known as: Vraylar Take 1 capsule (1.5 mg total) by mouth daily.   cloNIDine 0.3 mg/24hr patch Commonly known as: CATAPRES - Dosed in mg/24 hr APPLY 1 PATCH(0.3 MG) TOPICALLY TO THE SKIN 1 TIME A WEEK What changed: See the new instructions.   cyclobenzaprine 5 MG tablet Commonly known as: FLEXERIL Take 5 mg by mouth 3 (three) times daily as needed for muscle spasms.   DULoxetine 60 MG capsule Commonly known as: CYMBALTA Take 1 capsule (60 mg total) by mouth daily.   enoxaparin 100  MG/ML injection Commonly known as: LOVENOX Inject 1 mL (100 mg total) into the skin every 12 (twelve) hours for 15 days. Do Lovenox injections for 15 days, then stop Lovenox and start taking xarelto   LORazepam 0.5 MG tablet Commonly known as: ATIVAN Take 1 tablet (0.5 mg total) by mouth every 12 (twelve) hours as needed for anxiety.   meclizine 25 MG tablet Commonly known as: ANTIVERT 1 pill at night as needed; if dizziness not improved can take one pill every 8 hours as needed/tolerated. What changed:  how much to take how to take this when to take this reasons to take this additional instructions   multivitamin with minerals Tabs tablet Take 1 tablet by mouth daily.   nystatin powder Commonly known as: MYCOSTATIN/NYSTOP Apply 1 Application topically 3 (three) times daily. What changed:  when to take this reasons to take this   olaparib 100 MG tablet Commonly known as: Lynparza TAKE 2 TABLETS (200 MG) TWICE A DAY MAY TAKE WITH FOOD TO DECREASE NAUSEA AND VOMITING What changed:  how much to take when to take this   omeprazole 20 MG capsule Commonly known as: PRILOSEC TAKE 1 CAPSULE(20 MG) BY MOUTH DAILY What changed:  how much to take how to take this when to take this   ondansetron 8 MG disintegrating tablet Commonly known as: ZOFRAN-ODT Take 1 tablet (8 mg total) by mouth every 8 (eight) hours as needed for nausea or vomiting.   oxyCODONE 5 MG immediate release tablet Commonly known as: Oxy IR/ROXICODONE Take 1 tablet (5 mg total) by mouth every 8 (eight) hours as needed for severe pain.   polyethylene glycol powder 17 GM/SCOOP powder Commonly known as: GLYCOLAX/MIRALAX Take 0.5 Containers by mouth daily as needed for mild constipation or moderate constipation.   pregabalin 150 MG capsule Commonly known as: LYRICA Take 150 mg by mouth 3 (three) times daily.   prochlorperazine 10 MG tablet Commonly known as: COMPAZINE Take 1 tablet (10 mg total) by mouth  every 6 (six) hours as needed for nausea or vomiting.   Rivaroxaban Stater Pack (15 mg and 20 mg) Commonly known as: [place] Start taking after you have completed 15 days of Lovenox injections. Start on Feb 2nd. Follow package directions: Take one 15mg  tablet by mouth twice a day. On day 22, switch to one 20mg  tablet once a day. Take with food. Start taking on: January 27, 2023   rosuvastatin 5 MG tablet Commonly known as: Crestor Take 1 tablet (5 mg total) by mouth daily. In place of Atorvastatin   senna 8.6 MG tablet Commonly known as: SENOKOT Take 1 tablet by mouth daily as needed for constipation.   SUMAtriptan 100 MG tablet Commonly known as: Imitrex Take 1 tablet (100 mg total) by mouth every 2 (two) hours as needed for migraine. May repeat in 2 hours if headache persists or recurs.   tiotropium 18 MCG inhalation  capsule Commonly known as: Spiriva HandiHaler Place 1 capsule (18 mcg total) into inhaler and inhale daily.   tirzepatide 5 MG/0.5ML Pen Commonly known as: MOUNJARO Inject 5 mg into the skin once a week.   traZODone 150 MG tablet Commonly known as: DESYREL Take 1 tablet (150 mg total) by mouth at bedtime as needed for sleep.   valACYclovir 1000 MG tablet Commonly known as: VALTREX Take 1 tablet (1,000 mg total) by mouth 2 (two) times daily as needed. What changed: reasons to take this   Varenicline Tartrate (Starter) 0.5 MG X 11 & 1 MG X 42 Tbpk Take 0.5 mg by mouth as directed. Will start on 1 mg Saturday 01-14-23        Follow-up Information     Earna Coder, MD. Schedule an appointment as soon as possible for a visit in 1 week(s).   Specialties: Internal Medicine, Oncology Contact information: [address] [place] [address] [phone]         [name], MD Follow up in 1 week(s).   Specialty: Family Medicine Why: Follow-up for high blood pressure Contact information: [address] [place] [address] [phone]                Discharge Exam: Ceasar Mons Weights   01/11/23 2134  Weight: 98.1 kg   General-appears in no acute distress Heart-S1-S2, regular, no murmur auscultated Lungs-clear to auscultation bilaterally, no wheezing or crackles auscultated Abdomen-soft, nontender, no organomegaly Extremities-no edema in the lower extremities Neuro-alert, oriented x3, no focal deficit noted  Condition at discharge: good  The results of significant diagnostics from this hospitalization (including imaging, microbiology, ancillary and laboratory) are listed below for reference.   Imaging Studies: MR BRAIN W WO CONTRAST  Result Date: 01/10/2023 CLINICAL DATA:  headaches/dizzyness/ovarian cancer EXAM: MRI HEAD WITHOUT AND WITH CONTRAST TECHNIQUE: Multiplanar, multiecho pulse sequences of the brain and surrounding structures were obtained without and with intravenous contrast. CONTRAST:  17mL GADAVIST GADOBUTROL 1 MMOL/ML IV SOLN COMPARISON:  MRI with contrast 08/20/2020. FINDINGS: Brain: No acute infarction, hemorrhage, hydrocephalus, extra-axial collection or mass lesion. No pathologic enhancement. Vascular: On postcontrast imaging, linear filling defect within the right jugular bulb, distal right transverse and sigmoid sinuses. This filling defect was not present on the prior. Skull and upper cervical spine: Normal marrow signal. Sinuses/Orbits: Negative. IMPRESSION: 1. 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 No adjacent brain edema and no evidence of acute infarct. 2. Otherwise, no evidence of acute intracranial abnormality or metastatic disease. These results will be called to the ordering clinician or representative by the Radiologist Assistant, and communication documented in the PACS or [employer]. Electronically Signed   By: Feliberto Harts M.D.   On: 01/10/2023  17:55   CT CHEST ABDOMEN PELVIS W CONTRAST  Result Date: 12/15/2022 CLINICAL DATA:  History of ovarian cancer monitor. Rising CA 125. * Tracking Code: BO * EXAM: CT CHEST, ABDOMEN, AND PELVIS WITH CONTRAST TECHNIQUE: Multidetector CT imaging of the chest, abdomen and pelvis was performed following the standard protocol during bolus administration of intravenous contrast. RADIATION DOSE REDUCTION: This exam was performed according to the departmental dose-optimization program which includes automated exposure control, adjustment of the mA and/or kV according to patient size and/or use of iterative reconstruction technique. CONTRAST:  OMNIPAQUE IOHEXOL 300 MG/ML  SOLN COMPARISON:  Multiple priors including most recent CT chest abdomen and pelvis dated August 30, 2022 FINDINGS: CT CHEST FINDINGS Cardiovascular: Accessed right chest Port-A-Cath with tip at the superior cavoatrial junction. Normal caliber thoracic aorta. No central pulmonary embolus on this nondedicated study. Normal size heart. No significant pericardial effusion/thickening. Mediastinum/Nodes: No supraclavicular adenopathy. No suspicious thyroid nodule. No pathologically enlarged mediastinal, hilar or axillary lymph nodes. Retained versus refluxed orally ingested contrast in the esophagus. Lungs/Pleura: No suspicious pulmonary nodules or masses. No pleural effusion. No pneumothorax. Musculoskeletal: No aggressive lytic or blastic lesion of bone. CT ABDOMEN PELVIS FINDINGS Hepatobiliary: No suspicious hepatic lesion. Gallbladder is unremarkable. No biliary ductal dilation. Pancreas: Pancreatic ductal dilation or evidence of acute inflammation. Spleen: No splenomegaly. Adrenals/Urinary Tract: Bilateral adrenal glands appear normal. No hydronephrosis. Kidneys demonstrate symmetric enhancement. Urinary bladder is unremarkable for degree of distension. Stomach/Bowel: Radiopaque enteric contrast material traverses the rectum. Stomach is  unremarkable. No pathologic dilation of small or large bowel. Colonic diverticulosis without findings of acute diverticulitis. Vascular/Lymphatic: Normal caliber abdominal aorta. No pathologically enlarged abdominal or pelvic lymph nodes. Reproductive: Uterus is surgically absent without new enhancing soft tissue nodularity along the vaginal cuff. No suspicious adnexal mass. Other: Similar trace pelvic free fluid without discrete omental or peritoneal nodularity. Diastasis rectus with a ventral hernia containing fat and nonobstructed portions of small bowel appear similar prior. Musculoskeletal: No aggressive lytic or blastic lesion of bone. IMPRESSION: 1. No evidence of local recurrence or metastatic disease in the chest, abdomen, or pelvis. 2. Colonic diverticulosis without findings of acute diverticulitis. 3. Retained versus refluxed orally ingested contrast in the esophagus. Correlate for symptoms of gastroesophageal reflux. 4. Diastasis rectus with a ventral hernia containing fat and nonobstructed portions of small bowel appear similar prior. Electronically Signed   By: Maudry Mayhew M.D.   On: 12/15/2022 10:18    Microbiology: Results for orders placed or performed in visit on 05/20/22  Urine Culture     Status: Abnormal   Collection Time: 05/20/22 11:34 AM   Specimen: Urine, Clean Catch  Result Value Ref Range Status   Specimen Description   Final    URINE, CLEAN CATCH Performed at [facility], [address] [place], [place] [address]    Special Requests   Final    NONE Performed at [facility], [address] [place], [place] [address]    Culture >=100,000 COLONIES/mL ESCHERICHIA COLI (A)  Final   Report Status 05/23/2022 FINAL  Final   Organism ID, Bacteria ESCHERICHIA COLI (A)  Final      Susceptibility   Escherichia coli - MIC*    AMPICILLIN 8 SENSITIVE Sensitive     CEFAZOLIN <=4 SENSITIVE Sensitive     CEFEPIME <=0.12 SENSITIVE Sensitive     CEFTRIAXONE <=0.25  SENSITIVE Sensitive     CIPROFLOXACIN <=0.25 SENSITIVE Sensitive     GENTAMICIN <=1 SENSITIVE Sensitive     IMIPENEM <=0.25 SENSITIVE Sensitive     NITROFURANTOIN <=16 SENSITIVE Sensitive     TRIMETH/SULFA <=20 SENSITIVE Sensitive     AMPICILLIN/SULBACTAM <=2 SENSITIVE Sensitive     PIP/TAZO <=4 SENSITIVE Sensitive     * >=100,000 COLONIES/mL ESCHERICHIA COLI   *Note: Due to a large number of results and/or encounters for the requested time period, some results have not been displayed. A complete set of results can be found in Results Review.    Labs: CBC: Recent Labs  Lab 01/10/23 1255 01/11/23 0412  WBC 5.2 5.7  NEUTROABS 2.7  --   HGB 15.3* 13.1  HCT 44.9 38.9  MCV 110.3* 113.7*  PLT 127* 99*   Basic Metabolic Panel: Recent Labs  Lab 01/10/23 1255 01/11/23 0412  NA 137 141  K 3.6 3.5  CL 104 105  CO2 25 29  GLUCOSE 112* 112*  BUN 12 11  CREATININE 0.83 0.79  CALCIUM 9.0 8.9   Liver Function Tests: Recent Labs  Lab 01/10/23 1255 01/11/23 0412  AST 19 17  ALT 13 10  ALKPHOS 49 37*  BILITOT 0.8 0.6  PROT 6.9 5.5*  ALBUMIN 3.9 3.2*   CBG: No results for input(s): "GLUCAP" in the last 168 hours.  Discharge time spent: greater than 30 minutes.  Signed: Meredeth Ide, MD [facility] 01/12/2023

## 2023-01-12 NOTE — Progress Notes (Signed)
Patient is being discharged home. Patient discharge instructions reviewed with patient including new medications and schedule for Lovenox and xarelto. Patient states that she has taken lovenox at home in the past and was able to verbalize steps on how to self administer and states she feels comfortable to do it at home. Patient verbalized an understanding of all directions and teachings. Patient will drive herself home, MD aware.

## 2023-01-12 NOTE — Telephone Encounter (Signed)
Doctor decided to do Lovenox and Xarelto.  Prior Authorization for Pradaxa has been cancelled.

## 2023-01-13 ENCOUNTER — Telehealth: Payer: Self-pay

## 2023-01-13 NOTE — Telephone Encounter (Signed)
Transition Care Management Follow-up Telephone Call Date of discharge and from where: [facility] 01/12/2023 How have you been since you were released from the hospital? better Any questions or concerns? Yes  Items Reviewed: Did the pt receive and understand the discharge instructions provided? Yes  Medications obtained and verified? Yes  Other? No  Any new allergies since your discharge? No  Dietary orders reviewed? Yes Do you have support at home? Yes   Home Care and Equipment/Supplies: Were home health services ordered? no If so, what is the name of the agency? N/a  Has the agency set up a time to come to the patient's home? no Were any new equipment or medical supplies ordered?  No What is the name of the medical supply agency? N/a Were you able to get the supplies/equipment? not applicable Do you have any questions related to the use of the equipment or supplies? No  Functional Questionnaire: (I = Independent and D = Dependent) ADLs: I  Bathing/Dressing- I  Meal Prep- I  Eating- I  Maintaining continence- I  Transferring/Ambulation- I  Managing Meds- I  Follow up appointments reviewed:  PCP Hospital f/u appt confirmed? No  No avail appts for 1 week. Sent message to staff to schedule Specialist Hospital f/u appt confirmed? Yes  Scheduled to see Oncology on 01/19/2023 @ 8:30. Are transportation arrangements needed? No  If their condition worsens, is the pt aware to call PCP or go to the Emergency Dept.? Yes Was the patient provided with contact information for the PCP's office or ED? Yes Was to pt encouraged to call back with questions or concerns? Yes Karena Addison, LPN [facility] Nurse Health Advisor Direct Dial [phone]

## 2023-01-15 ENCOUNTER — Encounter: Payer: Self-pay | Admitting: Internal Medicine

## 2023-01-16 ENCOUNTER — Encounter: Payer: Self-pay | Admitting: Internal Medicine

## 2023-01-17 ENCOUNTER — Encounter: Payer: Self-pay | Admitting: Internal Medicine

## 2023-01-18 NOTE — Progress Notes (Unsigned)
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 Neurology advised heaprin drip, taht was transitioned to lovenox , it was advised to switch to oral medication as outpatient, Dr. Markus Jarvis saw her today and is trying to get approval for Xarelto now . The cause of the thrombus may have been secondary to metastatic cancer . The headache has improved down from a 10 to a 5 and not as frequent now, no longer daily   During her stay Metoprolol was held since BP was low , she went home advised to stop oral clonidine but continue catapress for vasomotor symptoms   Since this morning after she saw Dr. Yevette Edwards they decided to resume clonidine at night since symptoms of night sweats intensified since she stopped taking the clonidine at night.   Reviewed the labs done this morning  No anemia, but has low platelets that is stable ( thrombocytopenia)   She has noticed bruising on site of lovenox   Patient Active Problem List   Diagnosis Date Noted   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   Dural sinus thrombosis 01/10/2023   DDD (degenerative disc disease), lumbar 09/28/2022   Intertrigo 86/57/8469   Metabolic syndrome 62/95/2841   Moderate episode of recurrent major depressive disorder ([place]) 06/17/2022   Smokers' cough ([place]) 05/17/2022   Dyslipidemia 05/17/2022   Elevated TSH 05/17/2022   Pre-diabetes 05/17/2022   Ventral hernia without obstruction or gangrene 04/26/2021   Malignant ascites 12/22/2019   Pleural effusion on right 12/22/2019   Tachycardia 12/22/2019   Primary peritoneal carcinomatosis ([place]) 12/16/2019   Goals of care, counseling/discussion 12/16/2019   Erythrocytosis  11/12/2019   Morbid obesity ([place]) 07/07/2018   BRCA1 gene mutation positive 06/18/2018   Fever blister 05/17/2018   Family history of breast cancer 05/08/2018   Family history of colon cancer in mother 05/08/2018   Migraine with aura and without status migrainosus 04/18/2018   Mild recurrent major depression ([place]) 01/20/2016   GERD without esophagitis 01/20/2016   Osteoarthritis, multiple sites 01/20/2016    Past Surgical History:  Procedure Laterality Date   ABDOMINAL HYSTERECTOMY  03/2020   APPENDECTOMY     LSC but "ruptured when they did the surgery"   BREAST BIOPSY Left 01/04/2021   MRI BX   CESAREAN SECTION     CYSTOSCOPY N/A 04/08/2020   Procedure: CYSTOSCOPY;  [name], MD;  [name];  Service: Gynecology;  [name];   INSERTION OF MESH N/A 04/26/2021   Procedure: INSERTION OF MESH;  [name], MD;  [name];  Service: General;  [name];   IR THORACENTESIS ASP PLEURAL SPACE W/IMG GUIDE  12/06/2019   IUD REMOVAL N/A 04/08/2020   Procedure: INTRAUTERINE DEVICE (IUD) REMOVAL;  [name], MD;  [name];  Service: Gynecology;  [name];   PARACENTESIS     x6   PORTA CATH INSERTION N/A 04/23/2020   Procedure: PORTA CATH INSERTION;  [name], MD;  Location: [facility];  Service: Cardiovascular;  [name];   TUBAL LIGATION     at time of CSxn   VENTRAL HERNIA REPAIR N/A  04/26/2021   Procedure: HERNIA REPAIR VENTRAL ADULT;  [name], MD;  [name];  Service: General;  [name];  need RNFA for the case   WRIST SURGERY Left 11/21/2016   plates and screws inserted    Family History  Adopted: Yes  Problem Relation Age of Onset   Lung cancer Father        deceased 76   Breast cancer Mother 45       currently 54   Colon cancer Mother    ADD / ADHD Son    ADD / ADHD Son    Early death Maternal Aunt     Breast cancer Maternal Aunt 63       deceased 11   Breast cancer Maternal Grandmother    Depression Daughter    Depression Daughter    Prostate cancer Paternal Uncle    Stroke Paternal Uncle    Leukemia Paternal Aunt    Breast cancer Paternal Grandmother    Cancer Maternal Uncle     Social History   Tobacco Use   Smoking status: Every Day    Packs/day: 1.00    Years: 30.00    Total pack years: 30.00    Types: Cigarettes   Smokeless tobacco: Never  Substance Use Topics   Alcohol use: Not Currently    Alcohol/week: 0.0 standard drinks of alcohol     Current Outpatient Medications:    buPROPion (WELLBUTRIN XL) 300 MG 24 hr tablet, Take 1 tablet (300 mg total) by mouth daily., Disp: 90 tablet, Rfl: 0   cariprazine (VRAYLAR) 1.5 MG capsule, Take 1 capsule (1.5 mg total) by mouth daily., Disp: 90 capsule, Rfl: 0   cloNIDine (CATAPRES - DOSED IN MG/24 HR) 0.3 mg/24hr patch, Place 1 patch (0.3 mg total) onto the skin once a week. Friday, Disp: 12 patch, Rfl: 3   cloNIDine (CATAPRES) 0.1 MG tablet, Take 1 tablet (0.1 mg total) by mouth at bedtime., Disp: 90 tablet, Rfl: 1   cyclobenzaprine (FLEXERIL) 5 MG tablet, Take 5 mg by mouth 3 (three) times daily as needed for muscle spasms., Disp: , Rfl:    DULoxetine (CYMBALTA) 60 MG capsule, Take 1 capsule (60 mg total) by mouth daily., Disp: 90 capsule, Rfl: 0   enoxaparin (LOVENOX) 100 MG/ML injection, Inject 1 mL (100 mg total) into the skin every 12 (twelve) hours for 15 days. Do Lovenox injections for 15 days, then stop Lovenox and start taking xarelto, Disp: 30 mL, Rfl: 0   LORazepam (ATIVAN) 0.5 MG tablet, Take 1 tablet (0.5 mg total) by mouth every 12 (twelve) hours as needed for anxiety., Disp: 60 tablet, Rfl: 0   meclizine (ANTIVERT) 25 MG tablet, 1 pill at night as needed; if dizziness not improved can take one pill every 8 hours as needed/tolerated. (Patient taking differently: Take 25 mg by mouth 3 (three) times daily as needed for  dizziness.), Disp: 30 tablet, Rfl: 0   Multiple Vitamin (MULTIVITAMIN WITH MINERALS) TABS tablet, Take 1 tablet by mouth daily., Disp: , Rfl:    nystatin (MYCOSTATIN/NYSTOP) powder, Apply 1 Application topically 3 (three) times daily. (Patient taking differently: Apply 1 Application topically 3 (three) times daily as needed (rash).), Disp: 60 g, Rfl: 0   olaparib (LYNPARZA) 100 MG tablet, TAKE 2 TABLETS (200 MG) TWICE A DAY MAY TAKE WITH FOOD TO DECREASE NAUSEA AND VOMITING (Patient taking differently: 200 mg 2 (two) times daily. TAKE 2 TABLETS (200 MG) TWICE A DAY MAY TAKE WITH  FOOD TO DECREASE NAUSEA AND VOMITING), Disp: 120 tablet, Rfl: 0   omeprazole (PRILOSEC) 20 MG capsule, TAKE 1 CAPSULE(20 MG) BY MOUTH DAILY (Patient taking differently: Take 20 mg by mouth daily. TAKE 1 CAPSULE(20 MG) BY MOUTH DAILY), Disp: 90 capsule, Rfl: 2   ondansetron (ZOFRAN-ODT) 8 MG disintegrating tablet, Take 1 tablet (8 mg total) by mouth every 8 (eight) hours as needed for nausea or vomiting., Disp: 30 tablet, Rfl: 3   oxyCODONE (OXY IR/ROXICODONE) 5 MG immediate release tablet, Take 1 tablet (5 mg total) by mouth every 8 (eight) hours as needed for severe pain., Disp: 60 tablet, Rfl: 0   polyethylene glycol powder (GLYCOLAX/MIRALAX) 17 GM/SCOOP powder, Take 0.5 Containers by mouth daily as needed for mild constipation or moderate constipation., Disp: , Rfl:    pregabalin (LYRICA) 150 MG capsule, Take 150 mg by mouth 3 (three) times daily., Disp: , Rfl:    prochlorperazine (COMPAZINE) 10 MG tablet, Take 1 tablet (10 mg total) by mouth every 6 (six) hours as needed for nausea or vomiting., Disp: 40 tablet, Rfl: 3   [START ON 01/27/2023] RIVAROXABAN (XARELTO) VTE STARTER PACK (15 & 20 MG), Start taking after you have completed 15 days of Lovenox injections. Start on Feb 2nd. Follow package directions: Take one 15mg  tablet by mouth twice a day. On day 22, switch to one 20mg  tablet once a day. Take with food., Disp: 51 each,  Rfl: 0   rosuvastatin (CRESTOR) 5 MG tablet, Take 1 tablet (5 mg total) by mouth daily. In place of Atorvastatin, Disp: 90 tablet, Rfl: 0   senna (SENOKOT) 8.6 MG tablet, Take 1 tablet by mouth daily as needed for constipation., Disp: , Rfl:    SUMAtriptan (IMITREX) 100 MG tablet, Take 1 tablet (100 mg total) by mouth every 2 (two) hours as needed for migraine. May repeat in 2 hours if headache persists or recurs., Disp: 10 tablet, Rfl: 2   tiotropium (SPIRIVA HANDIHALER) 18 MCG inhalation capsule, Place 1 capsule (18 mcg total) into inhaler and inhale daily., Disp: 90 capsule, Rfl: 1   tirzepatide (MOUNJARO) 5 MG/0.5ML Pen, Inject 5 mg into the skin once a week., Disp: 6 mL, Rfl: 0   traZODone (DESYREL) 150 MG tablet, Take 1 tablet (150 mg total) by mouth at bedtime as needed for sleep., Disp: 30 tablet, Rfl: 3   valACYclovir (VALTREX) 1000 MG tablet, Take 1 tablet (1,000 mg total) by mouth 2 (two) times daily as needed. (Patient taking differently: Take 1,000 mg by mouth 2 (two) times daily as needed (fever blister).), Disp: 6 tablet, Rfl: 2   Varenicline Tartrate, Starter, 0.5 MG X 11 & 1 MG X 42 TBPK, Take 0.5 mg by mouth as directed. Will start on 1 mg Saturday 01-14-23, Disp: , Rfl:  No current facility-administered medications for this visit.  Facility-Administered Medications Ordered in Other Visits:    heparin lock flush 100 unit/mL, 250 Units, Intracatheter, Once PRN, Cammie Sickle, MD   ondansetron (ZOFRAN) 4 MG/2ML injection, , , ,    sodium chloride flush (NS) 0.9 % injection 10 mL, 10 mL, Intravenous, Once, Brahmanday, Govinda R, MD   sodium chloride flush (NS) 0.9 % injection 10 mL, 10 mL, Intracatheter, PRN, Cammie Sickle, MD  Allergies  Allergen Reactions   Hydroxyzine Hcl Other (See Comments)    Makes her loopy/jittery    I personally reviewed active problem list, medication list, allergies, family history, social history, health maintenance with the  patient/caregiver today.  ROS  Ten systems reviewed and is negative except as mentioned in HPI   Objective  Vitals:   01/19/23 1103  BP: 100/70  Pulse: 92  Resp: 16  Temp: 97.9 F (36.6 C)  TempSrc: Oral  SpO2: 98%  Weight: 210 lb 4.8 oz (95.4 kg)  Height: 5\' 3"  (1.6 m)    Body mass index is 37.25 kg/m.  Physical Exam  Constitutional: Patient appears well-developed and well-nourished. Obese  No distress.  HEENT: head atraumatic, normocephalic, pupils equal and reactive to light, neck supple Cardiovascular: Normal rate, regular rhythm and normal heart sounds.  No murmur heard. No BLE edema. Pulmonary/Chest: Effort normal and breath sounds normal. No respiratory distress. Abdominal: Soft.  There is no tenderness. Psychiatric: Patient has a normal mood and affect. behavior is normal. Judgment and thought content normal.    PHQ2/9:    01/19/2023   11:11 AM 01/04/2023   11:47 AM 12/12/2022    4:32 PM 09/28/2022   11:06 AM 09/28/2022   10:44 AM  Depression screen PHQ 2/9  Decreased Interest 2 2 0 3 2  Down, Depressed, Hopeless 3 2 3 3 2   PHQ - 2 Score 5 4 3 6 4   Altered sleeping 3 3 0 2 2  Tired, decreased energy 2 2 3 3 2   Change in appetite 0 0 0  0  Feeling bad or failure about yourself  2 2 3 3  0  Trouble concentrating 2 2 3 2  0  Moving slowly or fidgety/restless 0 0 0  0  Suicidal thoughts 0 0 0  0  PHQ-9 Score 14 13 12 16 8   Difficult doing work/chores Very difficult Somewhat difficult Somewhat difficult      phq 9 is positive   Fall Risk:    01/19/2023   11:02 AM 09/28/2022   10:57 AM 08/05/2022    9:18 AM 06/17/2022    2:31 PM 05/17/2022   11:24 AM  Fall Risk   Falls in the past year? 0 0 0 0 1  Number falls in past yr:  0 0 0 1  Injury with Fall?  0 0 0 0  Risk for fall due to : No Fall Risks No Fall Risks No Fall Risks No Fall Risks No Fall Risks  Follow up Falls prevention discussed;Education provided Falls prevention discussed Falls prevention  discussed;Education provided Falls prevention discussed;Education provided Falls prevention discussed      Functional Status Survey: Is the patient deaf or have difficulty hearing?: No Does the patient have difficulty seeing, even when wearing glasses/contacts?: Yes Does the patient have difficulty concentrating, remembering, or making decisions?: Yes Does the patient have difficulty walking or climbing stairs?: Yes Does the patient have difficulty dressing or bathing?: No Does the patient have difficulty doing errands alone such as visiting a doctor's office or shopping?: No    Assessment & Plan  1. Hospital discharge follow-up  Medication reconciliation was done  2. Dural sinus thrombosis  Switching to Xarelto

## 2023-01-19 ENCOUNTER — Ambulatory Visit (INDEPENDENT_AMBULATORY_CARE_PROVIDER_SITE_OTHER): Payer: Medicare Other | Admitting: Family Medicine

## 2023-01-19 ENCOUNTER — Inpatient Hospital Stay: Payer: Medicare Other

## 2023-01-19 ENCOUNTER — Encounter: Payer: Self-pay | Admitting: Internal Medicine

## 2023-01-19 ENCOUNTER — Encounter: Payer: Self-pay | Admitting: Family Medicine

## 2023-01-19 ENCOUNTER — Inpatient Hospital Stay (HOSPITAL_BASED_OUTPATIENT_CLINIC_OR_DEPARTMENT_OTHER): Payer: Medicare Other | Admitting: Internal Medicine

## 2023-01-19 VITALS — BP 118/86 | HR 78 | Temp 96.1°F | Resp 18 | Wt 211.8 lb

## 2023-01-19 VITALS — BP 100/70 | HR 92 | Temp 97.9°F | Resp 16 | Ht 63.0 in | Wt 210.3 lb

## 2023-01-19 DIAGNOSIS — Z7189 Other specified counseling: Secondary | ICD-10-CM

## 2023-01-19 DIAGNOSIS — I959 Hypotension, unspecified: Secondary | ICD-10-CM | POA: Diagnosis not present

## 2023-01-19 DIAGNOSIS — Z09 Encounter for follow-up examination after completed treatment for conditions other than malignant neoplasm: Secondary | ICD-10-CM | POA: Diagnosis not present

## 2023-01-19 DIAGNOSIS — Z79899 Other long term (current) drug therapy: Secondary | ICD-10-CM | POA: Diagnosis not present

## 2023-01-19 DIAGNOSIS — G4700 Insomnia, unspecified: Secondary | ICD-10-CM | POA: Diagnosis not present

## 2023-01-19 DIAGNOSIS — R112 Nausea with vomiting, unspecified: Secondary | ICD-10-CM | POA: Diagnosis not present

## 2023-01-19 DIAGNOSIS — R519 Headache, unspecified: Secondary | ICD-10-CM | POA: Diagnosis not present

## 2023-01-19 DIAGNOSIS — R42 Dizziness and giddiness: Secondary | ICD-10-CM | POA: Diagnosis not present

## 2023-01-19 DIAGNOSIS — C482 Malignant neoplasm of peritoneum, unspecified: Secondary | ICD-10-CM

## 2023-01-19 DIAGNOSIS — G08 Intracranial and intraspinal phlebitis and thrombophlebitis: Secondary | ICD-10-CM | POA: Diagnosis not present

## 2023-01-19 DIAGNOSIS — F32A Depression, unspecified: Secondary | ICD-10-CM | POA: Diagnosis not present

## 2023-01-19 DIAGNOSIS — F419 Anxiety disorder, unspecified: Secondary | ICD-10-CM | POA: Diagnosis not present

## 2023-01-19 LAB — CBC WITH DIFFERENTIAL/PLATELET
Abs Immature Granulocytes: 0.01 10*3/uL (ref 0.00–0.07)
Basophils Absolute: 0.1 10*3/uL (ref 0.0–0.1)
Basophils Relative: 1 %
Eosinophils Absolute: 0.1 10*3/uL (ref 0.0–0.5)
Eosinophils Relative: 3 %
HCT: 44.9 % (ref 36.0–46.0)
Hemoglobin: 14.6 g/dL (ref 12.0–15.0)
Immature Granulocytes: 0 %
Lymphocytes Relative: 38 %
Lymphs Abs: 1.3 10*3/uL (ref 0.7–4.0)
MCH: 38.1 pg — ABNORMAL HIGH (ref 26.0–34.0)
MCHC: 32.5 g/dL (ref 30.0–36.0)
MCV: 117.2 fL — ABNORMAL HIGH (ref 80.0–100.0)
Monocytes Absolute: 0.3 10*3/uL (ref 0.1–1.0)
Monocytes Relative: 8 %
Neutro Abs: 1.8 10*3/uL (ref 1.7–7.7)
Neutrophils Relative %: 50 %
Platelets: 122 10*3/uL — ABNORMAL LOW (ref 150–400)
RBC: 3.83 MIL/uL — ABNORMAL LOW (ref 3.87–5.11)
RDW: 14.6 % (ref 11.5–15.5)
WBC: 3.5 10*3/uL — ABNORMAL LOW (ref 4.0–10.5)
nRBC: 0 % (ref 0.0–0.2)

## 2023-01-19 LAB — COMPREHENSIVE METABOLIC PANEL
ALT: 23 U/L (ref 0–44)
AST: 34 U/L (ref 15–41)
Albumin: 3.8 g/dL (ref 3.5–5.0)
Alkaline Phosphatase: 50 U/L (ref 38–126)
Anion gap: 10 (ref 5–15)
BUN: 7 mg/dL (ref 6–20)
CO2: 24 mmol/L (ref 22–32)
Calcium: 9 mg/dL (ref 8.9–10.3)
Chloride: 104 mmol/L (ref 98–111)
Creatinine, Ser: 0.71 mg/dL (ref 0.44–1.00)
GFR, Estimated: >60 mL/min (ref >60)
Glucose, Bld: 102 mg/dL — ABNORMAL HIGH (ref 70–99)
Potassium: 3.5 mmol/L (ref 3.5–5.1)
Sodium: 138 mmol/L (ref 135–145)
Total Bilirubin: 0.7 mg/dL (ref 0.3–1.2)
Total Protein: 7.1 g/dL (ref 6.5–8.1)

## 2023-01-19 MED ORDER — CLONIDINE HCL 0.1 MG PO TABS: 0.1000 mg | ORAL_TABLET | Freq: Every day | ORAL | 1 refills | Status: DC

## 2023-01-19 MED ORDER — CLONIDINE HCL 0.1 MG PO TABS: 0.1000 mg | ORAL_TABLET | Freq: Every day | ORAL | 2 refills | Status: DC

## 2023-01-19 MED ORDER — CLONIDINE 0.3 MG/24HR TD PTWK: 0.3000 mg | MEDICATED_PATCH | TRANSDERMAL | 3 refills | Status: DC

## 2023-01-19 MED ORDER — HEPARIN SOD (PORK) LOCK FLUSH 100 UNIT/ML IV SOLN
500.0000 [IU] | Freq: Once | INTRAVENOUS | Status: AC
  Administered 2023-01-19: 500 [IU] via INTRAVENOUS
  Filled 2023-01-19: qty 5

## 2023-01-19 NOTE — Progress Notes (Signed)
Still have headaches and dizzy spells but not as much as previous.  Has a headache today.  The pain in arms, shoulders, and legs are getting worse.  The arm/shoulder pain make it difficult to drive and get dressed, 6/10 pain scale.
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
## 2023-01-19 NOTE — Assessment & Plan Note (Addendum)
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 On avastin + lynparza-STABLE.  CT DEC 21st, 2023-CT CAP- No evidence of local recurrence or metastatic disease in the chest, abdomen, or pelvis. Understands patient currently has biochemical recurrence. RISING- JAN 2024- 26.    #  HOLD Avastin treatment today [see discussion below-venous thrombosis].  Labs reviewed.;  CBC- platelets >100; CMP are reviewed; adequate today.  HOLD  Lynparza 200 mg twice a day [See below re: joint pain/myalgias; severe anemia while on 250 mg twice a day].  Discussed with the patient that Avastin/given history of underlying malignancy can predispose to blood clots.  Recommend discontinuation of Avastin at this time.  Patient understand that she will need subsequent line of therapy given the progressive disease.  Will repeat imaging in 1 month-can consider restarting therapy-subsequent line including platinum as patient is platinum sensitive.   # Intractable headaches Jodene Nam /nausea -JAN 2024- Brain MRI with and without contrast-nonocclusive dural venous sinus thrombosis noted-without any brain infarct or any metastatic disease to the brain.  Currently on Lovenox.  Will check with insurance. Recommend alternative anticoagulation like NOAK for long-term needs- start on xarelto after finishing Lovenox.   # Abdominal hernia-CT scan  DEC 2023-  ventral hernia noted; no obstruction.  JAN 2024 appt with surgeon at [facility] . # Weight loss/obesity- on Monjauro- losing weight.  Unfortunately the plan will be complicated given patient's need for anticoagulation.   # Worsening joint pains- ? Lynparza- Arthralgia (?30%), back pain (19%), myalgia (?30%). Recommend   # PN G-2/back pain [awaiting steroid injection]- on Lyrica 50 mg TID/Cymbalta- stable.   # Nausea-sec to [place]  Continue Zofran/ compazine prn monitor for now.   # Anxiety/Insomnia--social  stressors-continue Cymbalta [at 20 mg/day]; currently increased to trazadone 150 mg qhs- see above.   # Hot flashes: on Clonidine patch- on Friday weekly; and clonidine pill 0.1 qhs.   #IV access/Mediport-currently s/p TPA- port flush  # DISPOSITION:  #  HOLD M-vasi; de-access # in 2 week/Thursday - MD;  labs-  cbc/cmp; ca-125; bone scan- Dr.B  # 40 minutes face-to-face with the patient discussing the above plan of care; more than 50% of time spent on prognosis/ natural history; counseling and coordination.

## 2023-01-21 LAB — CA 125: Cancer Antigen (CA) 125: 50.7 U/mL — ABNORMAL HIGH (ref 0.0–38.1)

## 2023-01-24 ENCOUNTER — Emergency Department (HOSPITAL_COMMUNITY)
Admission: EM | Admit: 2023-01-24 | Discharge: 2023-01-25 | Disposition: A | Discharge status: Home / Self Care | Payer: Medicare Other | Attending: Emergency Medicine | Admitting: Emergency Medicine

## 2023-01-24 ENCOUNTER — Emergency Department (HOSPITAL_COMMUNITY): Payer: Medicare Other

## 2023-01-24 ENCOUNTER — Other Ambulatory Visit: Payer: Self-pay

## 2023-01-24 DIAGNOSIS — Z8543 Personal history of malignant neoplasm of ovary: Secondary | ICD-10-CM | POA: Insufficient documentation

## 2023-01-24 DIAGNOSIS — I10 Essential (primary) hypertension: Secondary | ICD-10-CM | POA: Diagnosis not present

## 2023-01-24 DIAGNOSIS — F17210 Nicotine dependence, cigarettes, uncomplicated: Secondary | ICD-10-CM | POA: Insufficient documentation

## 2023-01-24 DIAGNOSIS — R519 Headache, unspecified: Secondary | ICD-10-CM | POA: Insufficient documentation

## 2023-01-24 DIAGNOSIS — R42 Dizziness and giddiness: Secondary | ICD-10-CM | POA: Diagnosis not present

## 2023-01-24 LAB — CBC WITH DIFFERENTIAL/PLATELET
Abs Immature Granulocytes: 0.02 10*3/uL (ref 0.00–0.07)
Basophils Absolute: 0.1 10*3/uL (ref 0.0–0.1)
Basophils Relative: 1 %
Eosinophils Absolute: 0.1 10*3/uL (ref 0.0–0.5)
Eosinophils Relative: 2 %
HCT: 41.5 % (ref 36.0–46.0)
Hemoglobin: 14.2 g/dL (ref 12.0–15.0)
Immature Granulocytes: 0 %
Lymphocytes Relative: 45 %
Lymphs Abs: 2 10*3/uL (ref 0.7–4.0)
MCH: 37.9 pg — ABNORMAL HIGH (ref 26.0–34.0)
MCHC: 34.2 g/dL (ref 30.0–36.0)
MCV: 110.7 fL — ABNORMAL HIGH (ref 80.0–100.0)
Monocytes Absolute: 0.4 10*3/uL (ref 0.1–1.0)
Monocytes Relative: 8 %
Neutro Abs: 2 10*3/uL (ref 1.7–7.7)
Neutrophils Relative %: 44 %
Platelets: 108 10*3/uL — ABNORMAL LOW (ref 150–400)
RBC: 3.75 MIL/uL — ABNORMAL LOW (ref 3.87–5.11)
RDW: 14 % (ref 11.5–15.5)
WBC: 4.6 10*3/uL (ref 4.0–10.5)
nRBC: 0 % (ref 0.0–0.2)

## 2023-01-24 MED ORDER — DIPHENHYDRAMINE HCL 50 MG/ML IJ SOLN
25.0000 mg | Freq: Once | INTRAMUSCULAR | Status: AC
  Administered 2023-01-24: 25 mg via INTRAVENOUS
  Filled 2023-01-24: qty 1

## 2023-01-24 MED ORDER — CHLORHEXIDINE GLUCONATE CLOTH 2 % EX PADS: 6.0000 | MEDICATED_PAD | Freq: Every day | CUTANEOUS | Status: DC

## 2023-01-24 MED ORDER — PROCHLORPERAZINE EDISYLATE 10 MG/2ML IJ SOLN
10.0000 mg | Freq: Once | INTRAMUSCULAR | Status: AC
  Administered 2023-01-24: 10 mg via INTRAVENOUS
  Filled 2023-01-24: qty 2

## 2023-01-24 MED ORDER — SODIUM CHLORIDE 0.9 % IV BOLUS
1000.0000 mL | Freq: Once | INTRAVENOUS | Status: AC
  Administered 2023-01-24: 1000 mL via INTRAVENOUS

## 2023-01-24 MED ORDER — SODIUM CHLORIDE 0.9% FLUSH: 10.0000 mL | INTRAVENOUS | Status: DC | PRN

## 2023-01-24 MED ORDER — ACETAMINOPHEN 500 MG PO TABS
1000.0000 mg | ORAL_TABLET | Freq: Once | ORAL | Status: AC
  Administered 2023-01-24: 1000 mg via ORAL
  Filled 2023-01-24: qty 2

## 2023-01-24 NOTE — ED Triage Notes (Signed)
Patient reports chronic headache for several months worse this past several days at occipital area and right temple , denies head injury , alert and oriented , history of St.4 ovarian cancer with metastasis .
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
## 2023-01-24 NOTE — ED Provider Triage Note (Signed)
Emergency Medicine Provider Triage Evaluation Note  [name] , a 52 y.o. female  was evaluated in triage.  Pt complains of increasing frequency and severity of headaches for the last 6 months.  Patient states that onset headaches were only to occipital scalp but as of 3 days ago she has been having pain as well to the right parietal scalp that is severe in nature.  During this time, she has also been having multiple episodes of lightheadedness and dizziness where she feels that she is going to pass out.  Daughter became increasingly concerned today as she noted that patient was having hesitant and slurred speech.  She has not completely lost consciousness, hit her head, or fallen she.  She did have an MRI of her brain earlier this month to further evaluate the headaches in the setting of her history of metastatic ovarian cancer which showed multiple nonocclusive thrombi for which she was started on Lovenox injections that she has been taking as recommended.  She is not currently on any chemotherapy or radiation treatments.  No chest pain, shortness of breath, vision changes, fever, chills, nausea or vomiting, or other acute complaints today.  Review of Systems  Positive: See HPI Negative: See HPI  Physical Exam  BP 104/77   Pulse 74   Temp 97.6 F (36.4 C)   Resp 16   LMP 06/19/2017   SpO2 100%  Gen:   Awake, no distress   Resp:  Normal effort  MSK:   Moves extremities without difficulty  Other:  Regular rate and rhythm, 5/5 strength bilateral upper and lower extremities, cranial nerves intact, slightly hesitant to answer questions but speech is not overtly slurred, alert and oriented  Medical Decision Making  Medically screening exam initiated at 8:19 PM.  Appropriate orders placed.  TRAMEKA DOROUGH was informed that the remainder of the evaluation will be completed by another provider, this initial triage assessment does not replace that evaluation, and the importance of remaining in  the ED until their evaluation is complete.     Suzzette Righter, PA-C 01/24/23 2022

## 2023-01-25 DIAGNOSIS — R519 Headache, unspecified: Secondary | ICD-10-CM | POA: Diagnosis not present

## 2023-01-25 LAB — BASIC METABOLIC PANEL
Anion gap: 8 (ref 5–15)
BUN: 10 mg/dL (ref 6–20)
CO2: 23 mmol/L (ref 22–32)
Calcium: 8.9 mg/dL (ref 8.9–10.3)
Chloride: 105 mmol/L (ref 98–111)
Creatinine, Ser: 0.9 mg/dL (ref 0.44–1.00)
GFR, Estimated: >60 mL/min (ref >60)
Glucose, Bld: 111 mg/dL — ABNORMAL HIGH (ref 70–99)
Potassium: 3.7 mmol/L (ref 3.5–5.1)
Sodium: 136 mmol/L (ref 135–145)

## 2023-01-25 MED ORDER — HEPARIN SOD (PORK) LOCK FLUSH 100 UNIT/ML IV SOLN
500.0000 [IU] | Freq: Once | INTRAVENOUS | Status: AC
  Administered 2023-01-25: 500 [IU] via INTRAVENOUS
  Filled 2023-01-25: qty 5

## 2023-01-25 NOTE — Discharge Instructions (Signed)
You were evaluated in the Emergency Department and after careful evaluation, we did not find any emergent condition requiring admission or further testing in the hospital.  Your exam/testing today is overall reassuring.  Recommend continued follow-up with neurology to discuss her headaches.  Please return to the Emergency Department if you experience any worsening of your condition.   Thank you for allowing [place] to be a part of your care.

## 2023-01-26 ENCOUNTER — Ambulatory Visit: Payer: Medicare Other | Admitting: Psychologist

## 2023-01-27 ENCOUNTER — Encounter: Payer: Self-pay | Admitting: Internal Medicine

## 2023-01-30 ENCOUNTER — Encounter
Admission: RE | Admit: 2023-01-30 | Discharge: 2023-01-30 | Disposition: A | Discharge status: Home / Self Care | Payer: Medicare Other | Source: Ambulatory Visit | Attending: Internal Medicine | Admitting: Internal Medicine

## 2023-01-30 DIAGNOSIS — C482 Malignant neoplasm of peritoneum, unspecified: Secondary | ICD-10-CM | POA: Diagnosis not present

## 2023-01-30 DIAGNOSIS — M160 Bilateral primary osteoarthritis of hip: Secondary | ICD-10-CM | POA: Diagnosis not present

## 2023-01-30 DIAGNOSIS — C786 Secondary malignant neoplasm of retroperitoneum and peritoneum: Secondary | ICD-10-CM | POA: Diagnosis not present

## 2023-01-30 DIAGNOSIS — C481 Malignant neoplasm of specified parts of peritoneum: Secondary | ICD-10-CM | POA: Diagnosis not present

## 2023-01-30 MED ORDER — TECHNETIUM TC 99M MEDRONATE IV KIT
20.0000 | PACK | Freq: Once | INTRAVENOUS | Status: AC | PRN
  Administered 2023-01-30: 21.09 via INTRAVENOUS

## 2023-01-31 ENCOUNTER — Telehealth: Payer: Self-pay | Admitting: Internal Medicine

## 2023-01-31 ENCOUNTER — Encounter: Payer: Self-pay | Admitting: Neurology

## 2023-01-31 ENCOUNTER — Telehealth: Payer: Self-pay | Admitting: *Deleted

## 2023-01-31 ENCOUNTER — Telehealth: Payer: Self-pay | Admitting: Neurology

## 2023-01-31 ENCOUNTER — Encounter: Payer: Self-pay | Admitting: Diagnostic Neuroimaging

## 2023-01-31 ENCOUNTER — Ambulatory Visit (INDEPENDENT_AMBULATORY_CARE_PROVIDER_SITE_OTHER): Payer: Medicare Other | Admitting: Neurology

## 2023-01-31 ENCOUNTER — Ambulatory Visit: Payer: Medicare Other | Admitting: Diagnostic Neuroimaging

## 2023-01-31 VITALS — BP 149/110 | HR 96

## 2023-01-31 DIAGNOSIS — R519 Headache, unspecified: Secondary | ICD-10-CM

## 2023-01-31 DIAGNOSIS — G08 Intracranial and intraspinal phlebitis and thrombophlebitis: Secondary | ICD-10-CM | POA: Diagnosis not present

## 2023-01-31 DIAGNOSIS — R52 Pain, unspecified: Secondary | ICD-10-CM | POA: Diagnosis not present

## 2023-01-31 NOTE — Telephone Encounter (Signed)
I spoke with Dr B and he said that she can take 1 - 2 tabs of Oxycodone 5 mg every 8 hours as needed until she speaks with JB tomorrow. I spoke with patient to inform her of new directions for tonight only. She states that she has been taking 1 - 2 every 6 hours already, I informed her that it is ordered 1 every 8 hours as needed, She  said she was taking more due to her pain increase, and does report that she has enough to las t her over night. Before we disconnected, she said she has only been taking one at a time. So I am not sure how she is actually taking it. For now I left it that she can take 2 tabs every 8 hours prn and no refill tonight is needed

## 2023-01-31 NOTE — Telephone Encounter (Signed)
Patient called complaining of severe pain, she is on Oxy 5 every 8 hours as needed. Merrily Pew is to do a virtual visit tomorrow with her, but her Bone scan was not good either  IMPRESSION: Nonspecific increased tracer uptake anterior RIGHT fifth rib, could represent metastatic disease or rib fracture; recent CT demonstrates mild sclerosis and minimal periosteal new bone at this site with probable tiny focus of cortical discontinuity, favor healing nondisplaced fracture though metastatic focus not completely excluded.   Electronically Signed By: Lavonia Dana M.D. On: 01/31/2023 09:27  CAn you advise if she can increase her medicine?

## 2023-01-31 NOTE — Telephone Encounter (Signed)
medicare/[place] medicaid NPR sent to GI [phone]
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
## 2023-01-31 NOTE — Telephone Encounter (Signed)
Patient called requesting appointment with [facility]. She is having severe "all over" body pain, cannot get comfortable or sleep....please advise on scheduling. She is open to virtual/telephone visit to start.

## 2023-02-01 ENCOUNTER — Encounter: Payer: Self-pay | Admitting: Hospice and Palliative Medicine

## 2023-02-01 ENCOUNTER — Inpatient Hospital Stay: Payer: Medicare Other | Admitting: Licensed Clinical Social Worker

## 2023-02-01 ENCOUNTER — Inpatient Hospital Stay: Payer: Medicare Other | Attending: Internal Medicine | Admitting: Hospice and Palliative Medicine

## 2023-02-01 ENCOUNTER — Other Ambulatory Visit: Payer: Self-pay | Admitting: *Deleted

## 2023-02-01 ENCOUNTER — Encounter: Payer: Self-pay | Admitting: Internal Medicine

## 2023-02-01 DIAGNOSIS — G893 Neoplasm related pain (acute) (chronic): Secondary | ICD-10-CM | POA: Diagnosis not present

## 2023-02-01 DIAGNOSIS — C482 Malignant neoplasm of peritoneum, unspecified: Secondary | ICD-10-CM | POA: Diagnosis not present

## 2023-02-01 DIAGNOSIS — Z515 Encounter for palliative care: Secondary | ICD-10-CM

## 2023-02-01 LAB — ANA W/REFLEX IF POSITIVE: Anti Nuclear Antibody (ANA): NEGATIVE

## 2023-02-01 LAB — C-REACTIVE PROTEIN: CRP: 15 mg/L — ABNORMAL HIGH (ref 0–10)

## 2023-02-01 LAB — THYROID PANEL WITH TSH
Free Thyroxine Index: 2.4 (ref 1.2–4.9)
T3 Uptake Ratio: 26 % (ref 24–39)
T4, Total: 9.3 ug/dL (ref 4.5–12.0)
TSH: 1.98 u[IU]/mL (ref 0.450–4.500)

## 2023-02-01 LAB — CK: Total CK: 47 U/L (ref 32–182)

## 2023-02-01 LAB — SEDIMENTATION RATE: Sed Rate: 32 mm/hr (ref 0–40)

## 2023-02-01 MED ORDER — OXYCODONE HCL 5 MG PO TABS: 5.0000 mg | ORAL_TABLET | Freq: Three times a day (TID) | ORAL | 0 refills | Status: DC | PRN

## 2023-02-01 MED ORDER — PREDNISONE 20 MG PO TABS: ORAL_TABLET | ORAL | 0 refills | Status: DC

## 2023-02-01 NOTE — Progress Notes (Signed)
Virtual Visit via Telephone Note  I connected with [name] on 02/01/23 at  1:20 PM EST by telephone and verified that I am speaking with the correct person using two identifiers.  Location: Patient: Home Provider: Clinic   I discussed the limitations, risks, security and privacy concerns of performing an evaluation and management service by telephone and the availability of in person appointments. I also discussed with the patient that there may be a patient responsible charge related to this service. The patient expressed understanding and agreed to proceed.   History of Present Illness: Ms. 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  Patient is also status post neoadjuvant carbo/Taxol on maintenance Avastin/Lynparza.  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  Patient has also had chronic abdominal pain.  She was referred to palliative care to help address goals and manage ongoing symptoms.   Observations/Objective: Bone scan on 01/30/2023 suggestive of progression with nonspecific increased tracer uptake in the anterior right fifth rib.  CA125 has been rising.  I spoke with patient today regarding her pain.  She does not complain of pain in the right rib but says that she is having generalized muscle aches and pain from head to toe.  She says it hurts with movement and is keeping her awake at night.  She saw neurology yesterday with a mildly elevated CRP.  Patient is taking oxycodone several times a day and it helps slightly.  She is having difficulty sleeping despite use of trazodone.  Assessment and Plan: Stage IV  serous adenocarcinoma -bone scan concerning for possible progression.  Patient sees Dr. Rogue Bussing tomorrow   Neoplasm related pain-continue oxycodone.  Discussed with Dr. Rogue Bussing.  Unclear why patient is having myalgias but mildly elevated CRP suggestive of an inflammatory process.  Will start patient on prednisone taper.  Insomnia -continue trazodone  Follow Up Instructions: Follow-up telephone visit 1-2 months   I discussed the assessment and treatment plan with the patient. The patient was provided an opportunity to ask questions and all were answered. The patient agreed with the plan and demonstrated an understanding of the instructions.   The patient was advised to call back or seek an in-person evaluation if the symptoms worsen or if the condition fails to improve as anticipated.  I provided 15 minutes of non-face-to-face time during this encounter.   Irean Hong, NP

## 2023-02-01 NOTE — Progress Notes (Signed)
[name] Progress Note  Clinical Education officer, museum contacted patient by phone follow-up.  Patient stated she is currently unhoused and is staying at her cousin's house. [name] and patient spoke about different housing options.  Patient already applied for housing with the housing authority but understands the wait list is 12-18 months.  [name] suggested patient may be able to find housing in a family care home and explained the process for placement.  [name] stated if patient is interested in family care home placement, she needs to contact her medicaid caseworker with DSS and request an FL2 for the provider to fill out.  [name] also recommended the patient ask the medicaid caseworker about Special Assistance Medicaid to find out if patient meets criteria.  Patient stated she is concerned about finding placement for her daughter as well, who also has Medicaid.  [name] suggested her daughter contact her medicaid caseworker and inquire about Special Assistance Medicaid and the LME-MCO that covers county.  [name] requested patient contact if she received the FL2 from the [facility] caseworker. Patient verbalized understanding.    Brianna Amas, LCSW    Patient is participating in a Managed Medicaid Plan:  Yes

## 2023-02-02 ENCOUNTER — Inpatient Hospital Stay: Payer: Medicare Other | Admitting: Internal Medicine

## 2023-02-02 ENCOUNTER — Inpatient Hospital Stay: Payer: Medicare Other

## 2023-02-02 ENCOUNTER — Encounter: Payer: Self-pay | Admitting: Licensed Clinical Social Worker

## 2023-02-02 NOTE — Progress Notes (Signed)
[facility] CSW Progress Note  Clinical Education officer, museum  CSW received voicemail from [facility] w/ [facility]   to confirm patient's diagnosis for financial assistance.  CSW spoke with Vernona Rieger at the [facility], and confirmed patient's diagnosis and oncologist name.    Adelene Amas, LCSW    Patient is participating in a Managed Medicaid Plan:  Yes

## 2023-02-03 ENCOUNTER — Other Ambulatory Visit: Payer: Self-pay | Admitting: Internal Medicine

## 2023-02-03 MED ORDER — RIVAROXABAN (XARELTO) VTE STARTER PACK (15 & 20 MG): ORAL_TABLET | ORAL | 0 refills | Status: DC

## 2023-02-03 NOTE — Progress Notes (Signed)
Called in script for xalreto as per pt request. GB

## 2023-02-06 ENCOUNTER — Inpatient Hospital Stay: Payer: Medicare Other

## 2023-02-06 ENCOUNTER — Inpatient Hospital Stay: Payer: Medicare Other | Admitting: Internal Medicine

## 2023-02-06 NOTE — Assessment & Plan Note (Deleted)
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 On avastin + lynparza-STABLE.  CT DEC 21st, 2023-CT CAP- No evidence of local recurrence or metastatic disease in the chest, abdomen, or pelvis. Understands patient currently has biochemical recurrence. RISING- JAN 2024- 76.    #  HOLD Avastin treatment today [see discussion below-venous thrombosis].  Labs reviewed.;  CBC- platelets >100; CMP are reviewed; adequate today.  HOLD  Lynparza 200 mg twice a day [See below re: joint pain/myalgias; severe anemia while on 250 mg twice a day].  Discussed with the patient that Avastin/given history of underlying malignancy can predispose to blood clots.  Recommend discontinuation of Avastin at this time.  Patient understand that she will need subsequent line of therapy given the progressive disease.  Will repeat imaging in 1 month-can consider restarting therapy-subsequent line including platinum as patient is platinum sensitive.   # Intractable headaches Jodene Nam /nausea -JAN 2024- Brain MRI with and without contrast-nonocclusive dural venous sinus thrombosis noted-without any brain infarct or any metastatic disease to the brain.  Currently on Lovenox.  Will check with insurance. Recommend alternative anticoagulation like NOAK for long-term needs- start on xarelto after finishing Lovenox.   # Abdominal hernia-CT scan  DEC 2023-  ventral hernia noted; no obstruction.  JAN 2024 appt with surgeon at [facility] . # Weight loss/obesity- on Monjauro- losing weight.  Unfortunately the plan will be complicated given patient's need for anticoagulation.   # Worsening joint pains- ? Lynparza- Arthralgia (?30%), back pain (19%), myalgia (?30%). Recommend   # PN G-2/back pain [awaiting steroid injection]- on Lyrica 50 mg TID/Cymbalta- stable.   # Nausea-sec to [place]  Continue Zofran/ compazine prn monitor for now.   # Anxiety/Insomnia--social  stressors-continue Cymbalta [at 20 mg/day]; currently increased to trazadone 150 mg qhs- see above.   # Hot flashes: on Clonidine patch- on Friday weekly; and clonidine pill 0.1 qhs.   #IV access/Mediport-currently s/p TPA- port flush  # DISPOSITION:  #  HOLD M-vasi; de-access # in 2 week/Thursday - MD;  labs-  cbc/cmp; ca-125; bone scan- Dr.B  # 40 minutes face-to-face with the patient discussing the above plan of care; more than 50% of time spent on prognosis/ natural history; counseling and coordination.
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
## 2023-02-13 ENCOUNTER — Inpatient Hospital Stay (HOSPITAL_BASED_OUTPATIENT_CLINIC_OR_DEPARTMENT_OTHER): Payer: Medicare Other | Admitting: Hospice and Palliative Medicine

## 2023-02-13 DIAGNOSIS — C482 Malignant neoplasm of peritoneum, unspecified: Secondary | ICD-10-CM | POA: Diagnosis not present

## 2023-02-13 DIAGNOSIS — Z515 Encounter for palliative care: Secondary | ICD-10-CM | POA: Diagnosis not present

## 2023-02-13 DIAGNOSIS — G893 Neoplasm related pain (acute) (chronic): Secondary | ICD-10-CM

## 2023-02-13 MED ORDER — PREDNISONE 5 MG PO TABS: 5.0000 mg | ORAL_TABLET | Freq: Every day | ORAL | 0 refills | Status: DC

## 2023-02-13 NOTE — Progress Notes (Signed)
Virtual Visit via Telephone Note  I connected with [name] on 02/13/23 at  1:20 PM EST by telephone and verified that I am speaking with the correct person using two identifiers.  Location: Patient: Home Provider: Clinic   I discussed the limitations, risks, security and privacy concerns of performing an evaluation and management service by telephone and the availability of in person appointments. I also discussed with the patient that there may be a patient responsible charge related to this service. The patient expressed understanding and agreed to proceed.   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  Patient is also status post neoadjuvant carbo/Taxol on maintenance Avastin/Lynparza.  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  Patient has also had chronic abdominal pain.  She was referred to palliative care to help address goals and manage ongoing symptoms.   Observations/Objective: Bone scan on 01/30/2023 suggestive of progression with nonspecific increased tracer uptake in the anterior right fifth rib.  CA125 has been rising.  Follow-up telephone visit today.  Patient reports that her pain is vastly improved on prednisone.  She says that as long as she is taking prednisone she does not hurt at all.  However, she says that she stopped taking it for couple days and the pain instantly returned.  She has requested continued slow taper.  We discussed risks versus benefits of chronic steroids.  Will continue to taper through next MD visit.  Patient denies any significant changes or concerns  today.  Assessment and Plan: Stage IV serous adenocarcinoma -bone scan concerning for possible progression.     Neoplasm related pain-continue oxycodone.  Pain is reportedly significantly improved on prednisone.  However, patient held prednisone for couple days and pain quickly returned.  Will continue slow prednisone taper and sent a new prescription for 5 mg dose to start following completion of current taper.  Insomnia -continue trazodone  Follow Up Instructions: Follow-up telephone visit 1-2 months   I discussed the assessment and treatment plan with the patient. The patient was provided an opportunity to ask questions and all were answered. The patient agreed with the plan and demonstrated an understanding of the instructions.   The patient was advised to call back or seek an in-person evaluation if the symptoms worsen or if the condition fails to improve as anticipated.  I provided 10 minutes of non-face-to-face time during this encounter.   Brianna Hong, NP

## 2023-02-14 ENCOUNTER — Encounter: Payer: Self-pay | Admitting: Family Medicine

## 2023-02-15 ENCOUNTER — Telehealth: Payer: Self-pay | Admitting: Pharmacist

## 2023-02-15 DIAGNOSIS — M47816 Spondylosis without myelopathy or radiculopathy, lumbar region: Secondary | ICD-10-CM | POA: Diagnosis not present

## 2023-02-15 NOTE — Telephone Encounter (Signed)
Received forwarded VM from [facility] stating that they were unable to reach patient for refill after several attempts. I attempted to reach patient but the call went to VM. Left a VM to let her know they pharmacy was trying to reach her and provided there phone number.

## 2023-02-16 ENCOUNTER — Other Ambulatory Visit: Payer: Self-pay | Admitting: Family Medicine

## 2023-02-16 MED ORDER — TIRZEPATIDE 5 MG/0.5ML ~~LOC~~ SOAJ: 5.0000 mg | SUBCUTANEOUS | 0 refills | Status: DC

## 2023-02-16 NOTE — Telephone Encounter (Addendum)
Received email notification from [facility] that they were able to get in touch with patient. Patient requested that they cancel her order of Lonie Peak with no reason given. Forwarding to Onc Team to keep in loop.

## 2023-02-16 NOTE — Telephone Encounter (Signed)
Called to check, but pharmacy still closed. Will callback when they open at [place].

## 2023-02-23 ENCOUNTER — Encounter: Payer: Self-pay | Admitting: Internal Medicine

## 2023-02-23 ENCOUNTER — Encounter: Payer: Self-pay | Admitting: *Deleted

## 2023-02-23 ENCOUNTER — Encounter: Payer: Self-pay | Admitting: Radiology

## 2023-02-27 ENCOUNTER — Inpatient Hospital Stay: Payer: Medicare Other

## 2023-02-27 ENCOUNTER — Inpatient Hospital Stay: Payer: Medicare Other | Admitting: Internal Medicine

## 2023-03-02 ENCOUNTER — Other Ambulatory Visit: Payer: Self-pay

## 2023-03-02 MED ORDER — RIVAROXABAN 20 MG PO TABS: 20.0000 mg | ORAL_TABLET | Freq: Every day | ORAL | 1 refills | Status: DC

## 2023-03-03 ENCOUNTER — Ambulatory Visit
Admission: RE | Admit: 2023-03-03 | Discharge: 2023-03-03 | Disposition: A | Discharge status: Home / Self Care | Payer: Medicare Other | Source: Ambulatory Visit | Attending: Neurology | Admitting: Neurology

## 2023-03-03 DIAGNOSIS — R519 Headache, unspecified: Secondary | ICD-10-CM

## 2023-03-03 DIAGNOSIS — G08 Intracranial and intraspinal phlebitis and thrombophlebitis: Secondary | ICD-10-CM | POA: Diagnosis not present

## 2023-03-03 DIAGNOSIS — R52 Pain, unspecified: Secondary | ICD-10-CM

## 2023-03-03 MED ORDER — IOPAMIDOL (ISOVUE-370) INJECTION 76%
80.0000 mL | Freq: Once | INTRAVENOUS | Status: AC | PRN
  Administered 2023-03-03: 80 mL via INTRAVENOUS

## 2023-03-07 ENCOUNTER — Telehealth: Payer: Self-pay | Admitting: Family Medicine

## 2023-03-07 NOTE — Telephone Encounter (Signed)
PA x3- all denied

## 2023-03-07 NOTE — Telephone Encounter (Signed)
Copied from [place] [id number]. Topic: General - Other >> Mar 07, 2023 12:42 PM Everette C wrote: Reason for CRM: [place]ta [place] with [employer] has called to share that the appeal denial was upheld   The patient's PCP will need to begin the next level of the appeals process for the patient's prescription for tirzepatide [facility]) 5 MG/0.5ML Pen [id number]  [id number]  Please contact further when possible

## 2023-03-10 ENCOUNTER — Encounter: Payer: Self-pay | Admitting: Internal Medicine

## 2023-03-10 ENCOUNTER — Inpatient Hospital Stay (HOSPITAL_BASED_OUTPATIENT_CLINIC_OR_DEPARTMENT_OTHER): Payer: Medicare Other | Admitting: Internal Medicine

## 2023-03-10 ENCOUNTER — Inpatient Hospital Stay: Payer: Medicare Other | Attending: Internal Medicine

## 2023-03-10 VITALS — BP 115/87 | HR 75 | Temp 98.1°F | Resp 18 | Wt 217.0 lb

## 2023-03-10 DIAGNOSIS — Z86718 Personal history of other venous thrombosis and embolism: Secondary | ICD-10-CM | POA: Diagnosis not present

## 2023-03-10 DIAGNOSIS — Z95828 Presence of other vascular implants and grafts: Secondary | ICD-10-CM

## 2023-03-10 DIAGNOSIS — R300 Dysuria: Secondary | ICD-10-CM

## 2023-03-10 DIAGNOSIS — N23 Unspecified renal colic: Secondary | ICD-10-CM | POA: Insufficient documentation

## 2023-03-10 DIAGNOSIS — G8929 Other chronic pain: Secondary | ICD-10-CM | POA: Diagnosis not present

## 2023-03-10 DIAGNOSIS — F419 Anxiety disorder, unspecified: Secondary | ICD-10-CM | POA: Insufficient documentation

## 2023-03-10 DIAGNOSIS — C482 Malignant neoplasm of peritoneum, unspecified: Secondary | ICD-10-CM

## 2023-03-10 DIAGNOSIS — Z79899 Other long term (current) drug therapy: Secondary | ICD-10-CM | POA: Diagnosis not present

## 2023-03-10 DIAGNOSIS — N951 Menopausal and female climacteric states: Secondary | ICD-10-CM | POA: Insufficient documentation

## 2023-03-10 DIAGNOSIS — G629 Polyneuropathy, unspecified: Secondary | ICD-10-CM | POA: Diagnosis not present

## 2023-03-10 DIAGNOSIS — M2550 Pain in unspecified joint: Secondary | ICD-10-CM | POA: Diagnosis not present

## 2023-03-10 DIAGNOSIS — R110 Nausea: Secondary | ICD-10-CM | POA: Insufficient documentation

## 2023-03-10 DIAGNOSIS — G4700 Insomnia, unspecified: Secondary | ICD-10-CM | POA: Diagnosis not present

## 2023-03-10 DIAGNOSIS — R1909 Other intra-abdominal and pelvic swelling, mass and lump: Secondary | ICD-10-CM | POA: Insufficient documentation

## 2023-03-10 DIAGNOSIS — Z7901 Long term (current) use of anticoagulants: Secondary | ICD-10-CM | POA: Insufficient documentation

## 2023-03-10 DIAGNOSIS — Z5900 Homelessness unspecified: Secondary | ICD-10-CM | POA: Insufficient documentation

## 2023-03-10 LAB — COMPREHENSIVE METABOLIC PANEL
ALT: 15 U/L (ref 0–44)
AST: 18 U/L (ref 15–41)
Albumin: 3.9 g/dL (ref 3.5–5.0)
Alkaline Phosphatase: 57 U/L (ref 38–126)
Anion gap: 6 (ref 5–15)
BUN: 12 mg/dL (ref 6–20)
CO2: 26 mmol/L (ref 22–32)
Calcium: 9 mg/dL (ref 8.9–10.3)
Chloride: 105 mmol/L (ref 98–111)
Creatinine, Ser: 0.82 mg/dL (ref 0.44–1.00)
GFR, Estimated: >60 mL/min (ref >60)
Glucose, Bld: 102 mg/dL — ABNORMAL HIGH (ref 70–99)
Potassium: 4.2 mmol/L (ref 3.5–5.1)
Sodium: 137 mmol/L (ref 135–145)
Total Bilirubin: 0.4 mg/dL (ref 0.3–1.2)
Total Protein: 7 g/dL (ref 6.5–8.1)

## 2023-03-10 LAB — CBC WITH DIFFERENTIAL/PLATELET
Abs Immature Granulocytes: 0.02 10*3/uL (ref 0.00–0.07)
Basophils Absolute: 0 10*3/uL (ref 0.0–0.1)
Basophils Relative: 1 %
Eosinophils Absolute: 0.1 10*3/uL (ref 0.0–0.5)
Eosinophils Relative: 2 %
HCT: 43.8 % (ref 36.0–46.0)
Hemoglobin: 14.2 g/dL (ref 12.0–15.0)
Immature Granulocytes: 0 %
Lymphocytes Relative: 29 %
Lymphs Abs: 1.4 10*3/uL (ref 0.7–4.0)
MCH: 35 pg — ABNORMAL HIGH (ref 26.0–34.0)
MCHC: 32.4 g/dL (ref 30.0–36.0)
MCV: 107.9 fL — ABNORMAL HIGH (ref 80.0–100.0)
Monocytes Absolute: 0.4 10*3/uL (ref 0.1–1.0)
Monocytes Relative: 8 %
Neutro Abs: 2.9 10*3/uL (ref 1.7–7.7)
Neutrophils Relative %: 60 %
Platelets: 142 10*3/uL — ABNORMAL LOW (ref 150–400)
RBC: 4.06 MIL/uL (ref 3.87–5.11)
RDW: 13.7 % (ref 11.5–15.5)
WBC: 4.8 10*3/uL (ref 4.0–10.5)
nRBC: 0 % (ref 0.0–0.2)

## 2023-03-10 LAB — URINALYSIS, COMPLETE (UACMP) WITH MICROSCOPIC
Bacteria, UA: NONE SEEN
Bilirubin Urine: NEGATIVE
Glucose, UA: NEGATIVE mg/dL
Hgb urine dipstick: NEGATIVE
Ketones, ur: NEGATIVE mg/dL
Leukocytes,Ua: NEGATIVE
Nitrite: NEGATIVE
Protein, ur: NEGATIVE mg/dL
Specific Gravity, Urine: 1.02 (ref 1.005–1.030)
pH: 5 (ref 5.0–8.0)

## 2023-03-10 MED ORDER — PREDNISONE 5 MG PO TABS: 5.0000 mg | ORAL_TABLET | Freq: Every day | ORAL | 0 refills | Status: DC

## 2023-03-10 MED ORDER — HEPARIN SOD (PORK) LOCK FLUSH 100 UNIT/ML IV SOLN
500.0000 [IU] | Freq: Once | INTRAVENOUS | Status: AC
  Administered 2023-03-10: 500 [IU] via INTRAVENOUS
  Filled 2023-03-10: qty 5

## 2023-03-10 MED ORDER — SODIUM CHLORIDE 0.9% FLUSH
10.0000 mL | Freq: Once | INTRAVENOUS | Status: AC
  Administered 2023-03-10: 10 mL via INTRAVENOUS
  Filled 2023-03-10: qty 10

## 2023-03-10 NOTE — Assessment & Plan Note (Addendum)
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 CT DEC 21st, 2023-CT CAP- No evidence of local recurrence or metastatic disease in the chest, abdomen, or pelvis. Understands patient currently has biochemical recurrence. RISING- FEB, 2024- 41.  # Patient currently of Avastin plus Lynparza-currently discontinued because of ongoing joint pains/progressive disease and also recent blood clot in the brain.  Recommend CT scan of the chest and pelvis in 2 to 3 weeks to assess progression of disease.  Discussed with the patient that she will need subsequent line of chemotherapy-including platinum as patient is platinum sensitive.  # Ongoing MSK joints- not improved with stopping Lonie Peak [ Jan 2024] improved with prednisone 5 mg/day; continue prednisone for now. Consider rheumatology evaluation. ..  # Dysuria- UA- negative and culture- pending; ? Vaginal atrophy-   # Intractable headaches /dizziness /nausea -JAN 2024- Brain MRI with and without contrast-nonocclusive dural venous sinus thrombosis noted-without any brain infarct or any metastatic disease to the brain.  Currently on Lovenox.  Will check with insurance. Recommend alternative anticoagulation like NOAK for long-term needs- start on xarelto after finishing Lovenox.   # Abdominal hernia-CT scan  DEC 2023-  ventral hernia noted; no obstruction.  JAN 2024 appt with surgeon at [facility] . # Weight loss/obesity- on Monjauro- losing weight.  Unfortunately the plan will be complicated given patient's need for anticoagulation.   # PN G-2/back pain [awaiting steroid injection]- on Lyrica 50 mg TID/Cymbalta- stable.   # Nausea-sec to [place]  Continue Zofran/ compazine prn monitor for now.   # Anxiety/Insomnia--social stressors-continue Cymbalta [at 20 mg/day]; currently increased to trazadone 150 mg qhs- see above.   # Hot flashes: on Clonidine patch- on Friday weekly; and  clonidine pill 0.1 qhs.   #IV access/Mediport-currently s/p TPA- port flush  Cousin- [place] # DISPOSITION:  # refer to Rheuamtology re: joint pain # follow up in 3 weeks- MD; labs- cbc/cmp; ca-125-CT scan CAP prior- Dr.B

## 2023-03-10 NOTE — Progress Notes (Signed)
Urine has a strong odor has a shooting pai at end of urination. Got a urine sample today. No vaginal discharge or bleeding. Hernia repair is on hold d/t blood clots and anticoag medications. Has muscle aches throughout her body. Low energy. Not sleeping. Pt is homeless at present time. She is staying with a cousin in a teeny tiny room with her 2 adult children and her dog.

## 2023-03-10 NOTE — Progress Notes (Unsigned)
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 hernia noted; no obstruction.  JAN 2024 appt with surgeon at [facility] . # Weight loss/obesity- on Monjauro- losing weight.  Unfortunately the plan will be complicated given patient's need for anticoagulation.   # PN G-2/back pain [awaiting steroid injection]- on Lyrica 50 mg TID/Cymbalta- stable.   # Nausea-sec to [place]  Continue Zofran/ compazine prn monitor for now.   # Anxiety/Insomnia--social stressors-continue Cymbalta [at 20 mg/day]; currently increased to trazadone 150 mg qhs- see above.   # Hot flashes: on Clonidine patch- on Friday weekly; and clonidine pill 0.1 qhs.   #IV access/Mediport-currently s/p TPA- port flush  [place]- [place] # DISPOSITION:  # refer to Rheuamtology re: joint pain # follow up in 3 weeks- MD; labs- cbc/cmp; ca-125-CT scan CAP prior-  Dr.B          Cammie Sickle, MD 03/10/2023 3:19 PM

## 2023-03-11 LAB — CA 125: Cancer Antigen (CA) 125: 144 U/mL — ABNORMAL HIGH (ref 0.0–38.1)

## 2023-03-12 LAB — URINE CULTURE: Culture: NO GROWTH

## 2023-03-13 ENCOUNTER — Encounter: Payer: Self-pay | Admitting: Internal Medicine

## 2023-03-14 ENCOUNTER — Encounter: Payer: Self-pay | Admitting: Internal Medicine

## 2023-03-14 MED ORDER — PREDNISONE 10 MG PO TABS: 10.0000 mg | ORAL_TABLET | Freq: Every day | ORAL | 0 refills | Status: DC

## 2023-03-16 ENCOUNTER — Ambulatory Visit: Payer: Medicare Other | Admitting: Diagnostic Neuroimaging

## 2023-03-22 ENCOUNTER — Other Ambulatory Visit: Payer: Self-pay | Admitting: Family Medicine

## 2023-03-24 ENCOUNTER — Other Ambulatory Visit: Payer: Self-pay | Admitting: Family Medicine

## 2023-03-24 ENCOUNTER — Ambulatory Visit: Admission: RE | Admit: 2023-03-24 | Payer: Medicare Other | Source: Ambulatory Visit

## 2023-03-24 DIAGNOSIS — M5136 Other intervertebral disc degeneration, lumbar region: Secondary | ICD-10-CM

## 2023-03-24 DIAGNOSIS — F331 Major depressive disorder, recurrent, moderate: Secondary | ICD-10-CM

## 2023-03-27 ENCOUNTER — Inpatient Hospital Stay: Payer: Medicare Other | Attending: Internal Medicine | Admitting: Hospice and Palliative Medicine

## 2023-03-27 DIAGNOSIS — F419 Anxiety disorder, unspecified: Secondary | ICD-10-CM | POA: Insufficient documentation

## 2023-03-27 DIAGNOSIS — N951 Menopausal and female climacteric states: Secondary | ICD-10-CM | POA: Insufficient documentation

## 2023-03-27 DIAGNOSIS — C482 Malignant neoplasm of peritoneum, unspecified: Secondary | ICD-10-CM | POA: Diagnosis not present

## 2023-03-27 DIAGNOSIS — G893 Neoplasm related pain (acute) (chronic): Secondary | ICD-10-CM | POA: Diagnosis not present

## 2023-03-27 DIAGNOSIS — G4700 Insomnia, unspecified: Secondary | ICD-10-CM | POA: Insufficient documentation

## 2023-03-27 DIAGNOSIS — R110 Nausea: Secondary | ICD-10-CM | POA: Insufficient documentation

## 2023-03-27 DIAGNOSIS — Z5111 Encounter for antineoplastic chemotherapy: Secondary | ICD-10-CM | POA: Insufficient documentation

## 2023-03-27 MED ORDER — OXYCODONE HCL 5 MG PO TABS: 5.0000 mg | ORAL_TABLET | Freq: Three times a day (TID) | ORAL | 0 refills | Status: DC | PRN

## 2023-03-27 NOTE — Progress Notes (Signed)
Virtual Visit via Telephone Note  I connected with [name] on 03/27/23 at  2:20 PM EDT by telephone and verified that I am speaking with the correct person using two identifiers.  Location: Patient: Home Provider: Clinic   I discussed the limitations, risks, security and privacy concerns of performing an evaluation and management service by telephone and the availability of in person appointments. I also discussed with the patient that there may be a patient responsible charge related to this service. The patient expressed understanding and agreed to proceed.   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  Patient is also status post neoadjuvant carbo/Taxol on maintenance Avastin/Lynparza.  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  Patient has also had chronic abdominal pain.  She was referred to palliative care to help address goals and manage ongoing symptoms.   Observations/Objective: Bone scan on 01/30/2023 suggestive of progression with nonspecific increased tracer uptake in the anterior right fifth rib.  CA125 has been rising suggestive of recurrence.  Follow-up telephone visit today.  Patient reports she is doing about the same.  She denies significant changes.  She reports stable pain for which she takes oxycodone about 3 times daily with good effect.  Denies any adverse effects or issues with pain medications.  She does request refill of oxycodone today.  Patient reports some mental fogginess and occasional short-term memory loss.  She has been followed by neurology.  Recent CTA head with  unchanged dural venous sinus thrombosis.  Patient denies any significant changes or concerns today.  Assessment and Plan: Stage IV serous adenocarcinoma -bone scan and rising CA125 concerning for possible progression.  Patient will follow-up with Dr. Rogue Bussing next week   Neoplasm related pain-refill oxycodone.  Patient asks about referral to rheumatology for chronic musculoskeletal pains.  Insomnia -continue trazodone  Follow Up Instructions: Follow-up telephone visit 1-2 months   I discussed the assessment and treatment plan with the patient. The patient was provided an opportunity to ask questions and all were answered. The patient agreed with the plan and demonstrated an understanding of the instructions.   The patient was advised to call back or seek an in-person evaluation if the symptoms worsen or if the condition fails to improve as anticipated.  I provided 10 minutes of non-face-to-face time during this encounter.   Irean Hong, NP

## 2023-03-28 NOTE — Progress Notes (Deleted)
Name: [name]   MRN: [id number]    DOB: Oct 26, 1971   Date:03/28/2023       Progress Note  Subjective  Chief Complaint  Follow Up  HPI  HTN/Tachycardia: currently on metoprolol  25 mg and heart rate is normal now, bp is at goal ,no chest pain palpitation or SOB  Chronic back pain: still under the care of ortho and takes Lyrica and flexeril and symptoms are controlled at this time  She will have an ablation next week    Morbid obesity:she was diagnosed with carcinomatosis  in 11/2019 at a weight of 259 lbs. It went up to 271 lbs 08/2021, she was seen by [facility] August 2023 weight was 242 lbs. She has been eating healthier and started on Ozempic, weight went down to  234 lbs. Last visit we changed to [facility] and lost another 6 lbs. BMI still over 35 with co-morbidities.    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 Alto Denver. 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 She takes Duloxetine and Lyrica and seems to be controlling symptoms of neuropathy, pain level is 2/10 Still under the care of oncologist    Insomnia: she is off  Lunesta 2 mg because it was helping her fall but not stay asleep. She is now taking trazodone but no longer working, she will discuss it with oncologist    Cardiomegaly: on CT scan, however normal echo   Smoker's cough: trying to quit now, she has symbicort at home and takes it prn, however states insurance no longer going to cover, we will try sending spiriva today. She has SOB with moderate activity    Migraines: she stopped topamax, taking imitrex prn, she denies recent episodes of migraine . No recent episodes. She is having dull headaches   Depression  Major: recurrent, going on for a long time, she is back on Duloxetine since diagnosed with cancer Dec 2020. Wellbutrin was added but is under more stress, ,moved out of her oldest daughter's [place] now her youngest daughter moved in with her and also her son. She states struggling financially. She needs to move out . We add a mood stabilizer on her last visit , she states Abilify helped a little but still very depressed we will try switching to Vraylar  Dyslipidemia: LDL was very high and has atherosclerosis of aorta we will try adding statin therapy today   The 10-year ASCVD risk score (Arnett DK, et al., 2019) is: 3.9%   Values used to calculate the score:     Age: 52 years     Sex: Female     Is Non-Hispanic African American: No     Diabetic: No     Tobacco smoker: Yes     Systolic Blood Pressure: 123456 mmHg     Is BP treated: No     HDL Cholesterol: 58 mg/dL     Total Cholesterol: 249 mg/dL   Pre-diabetes: A1C is 6 % on mounjarno, denies polyphagia, polydipsia or polyuria   Patient Active Problem List   Diagnosis Date Noted   Complaints of total body pain 01/31/2023   Dural venous  sinus thrombosis 01/11/2023   Dural sinus thrombosis 01/10/2023   DDD (degenerative disc disease), lumbar 09/28/2022   Intertrigo 99991111   Metabolic syndrome 99991111   Moderate episode of recurrent major depressive disorder 06/17/2022   Smokers' cough 05/17/2022   Dyslipidemia 05/17/2022   Elevated TSH 05/17/2022   Pre-diabetes 05/17/2022   Ventral hernia without obstruction or gangrene 04/26/2021   Malignant ascites 12/22/2019   Pleural effusion on right 12/22/2019   Tachycardia 12/22/2019   Primary peritoneal carcinomatosis 12/16/2019   Goals of care, counseling/discussion 12/16/2019   Erythrocytosis 11/12/2019   Morbid obesity 07/07/2018   BRCA1 gene mutation positive 06/18/2018   Fever blister 05/17/2018   Family history of breast cancer 05/08/2018   Family history of colon cancer  in mother 05/08/2018   Migraine with aura and without status migrainosus 04/18/2018   Mild recurrent major depression 01/20/2016   GERD without esophagitis 01/20/2016   Osteoarthritis, multiple sites 01/20/2016    Past Surgical History:  Procedure Laterality Date   ABDOMINAL HYSTERECTOMY  03/2020   APPENDECTOMY     LSC but "ruptured when they did the surgery"   BREAST BIOPSY Left 01/04/2021   MRI BX   CESAREAN SECTION     CYSTOSCOPY N/A 04/08/2020   Procedure: CYSTOSCOPY;  [name], MD;  [name];  Service: Gynecology;  [name];   INSERTION OF MESH N/A 04/26/2021   Procedure: INSERTION OF MESH;  [name], MD;  [name];  Service: General;  [name];   IR THORACENTESIS ASP PLEURAL SPACE W/IMG GUIDE  12/06/2019   IUD REMOVAL N/A 04/08/2020   Procedure: INTRAUTERINE DEVICE (IUD) REMOVAL;  [name], MD;  [name];  Service: Gynecology;  [name];   PARACENTESIS     x6   PORTA CATH INSERTION N/A 04/23/2020   Procedure: PORTA CATH INSERTION;  [name], MD;  Location: [facility];  Service: Cardiovascular;  [name];   TUBAL LIGATION     at time of CSxn   VENTRAL HERNIA REPAIR N/A 04/26/2021   Procedure: HERNIA REPAIR VENTRAL ADULT;  [name], MD;  [name];  Service: General;  [name];  need RNFA for the case   WRIST SURGERY Left 11/21/2016   plates and screws inserted    Family History  Adopted: Yes  Problem Relation Age of Onset   Lung cancer Father        deceased 77   Breast cancer Mother 15       currently 91   Colon cancer Mother    ADD / ADHD Son    ADD / ADHD Son    Early death Maternal Aunt    Breast cancer Maternal Aunt 24       deceased 33   Breast cancer Maternal Grandmother    Depression Daughter    Depression Daughter    Prostate cancer Paternal Uncle    Stroke Paternal Uncle     Leukemia Paternal Aunt    Breast cancer Paternal Grandmother    Cancer Maternal Uncle     Social History   Tobacco Use   Smoking status: Every Day    Packs/day: 1.00    Years: 30.00    Additional pack years: 0.00    Total pack years: 30.00    Types: Cigarettes   Smokeless tobacco: Never  Substance Use Topics   Alcohol use: Not Currently    Alcohol/week: 0.0 standard  drinks of alcohol     Current Outpatient Medications:    buPROPion (WELLBUTRIN XL) 300 MG 24 hr tablet, Take 1 tablet (300 mg total) by mouth daily., Disp: 90 tablet, Rfl: 0   cariprazine (VRAYLAR) 1.5 MG capsule, Take 1 capsule (1.5 mg total) by mouth daily., Disp: 90 capsule, Rfl: 0   cloNIDine (CATAPRES - DOSED IN MG/24 HR) 0.3 mg/24hr patch, Place 1 patch (0.3 mg total) onto the skin once a week. Friday, Disp: 12 patch, Rfl: 3   cloNIDine (CATAPRES) 0.1 MG tablet, Take 1 tablet (0.1 mg total) by mouth at bedtime., Disp: 90 tablet, Rfl: 1   cyclobenzaprine (FLEXERIL) 5 MG tablet, Take 5 mg by mouth 3 (three) times daily as needed for muscle spasms., Disp: , Rfl:    DULoxetine (CYMBALTA) 60 MG capsule, TAKE 1 CAPSULE(60 MG) BY MOUTH DAILY, Disp: 90 capsule, Rfl: 0   LORazepam (ATIVAN) 0.5 MG tablet, Take 1 tablet (0.5 mg total) by mouth every 12 (twelve) hours as needed for anxiety., Disp: 60 tablet, Rfl: 0   magnesium citrate SOLN, 1 Bottle as needed., Disp: , Rfl:    meclizine (ANTIVERT) 25 MG tablet, 1 pill at night as needed; if dizziness not improved can take one pill every 8 hours as needed/tolerated. (Patient taking differently: Take 25 mg by mouth 3 (three) times daily as needed for dizziness.), Disp: 30 tablet, Rfl: 0   Multiple Vitamin (MULTIVITAMIN WITH MINERALS) TABS tablet, Take 1 tablet by mouth daily., Disp: , Rfl:    nystatin (MYCOSTATIN/NYSTOP) powder, Apply 1 Application topically 3 (three) times daily. (Patient taking differently: Apply 1 Application topically 3 (three) times daily as needed (rash).),  Disp: 60 g, Rfl: 0   olaparib (LYNPARZA) 100 MG tablet, TAKE 2 TABLETS (200 MG) TWICE A DAY MAY TAKE WITH FOOD TO DECREASE NAUSEA AND VOMITING (Patient not taking: Reported on 03/10/2023), Disp: 120 tablet, Rfl: 0   omeprazole (PRILOSEC) 20 MG capsule, TAKE 1 CAPSULE(20 MG) BY MOUTH DAILY (Patient taking differently: Take 20 mg by mouth daily. TAKE 1 CAPSULE(20 MG) BY MOUTH DAILY), Disp: 90 capsule, Rfl: 2   ondansetron (ZOFRAN-ODT) 8 MG disintegrating tablet, Take 1 tablet (8 mg total) by mouth every 8 (eight) hours as needed for nausea or vomiting. (Patient not taking: Reported on 03/10/2023), Disp: 30 tablet, Rfl: 3   oxyCODONE (OXY IR/ROXICODONE) 5 MG immediate release tablet, Take 1 tablet (5 mg total) by mouth every 8 (eight) hours as needed for severe pain., Disp: 60 tablet, Rfl: 0   polyethylene glycol powder (GLYCOLAX/MIRALAX) 17 GM/SCOOP powder, Take 0.5 Containers by mouth daily as needed for mild constipation or moderate constipation., Disp: , Rfl:    predniSONE (DELTASONE) 10 MG tablet, Take 1 tablet (10 mg total) by mouth daily with breakfast., Disp: 30 tablet, Rfl: 0   pregabalin (LYRICA) 150 MG capsule, Take 150 mg by mouth 3 (three) times daily., Disp: , Rfl:    prochlorperazine (COMPAZINE) 10 MG tablet, Take 1 tablet (10 mg total) by mouth every 6 (six) hours as needed for nausea or vomiting. (Patient not taking: Reported on 03/10/2023), Disp: 40 tablet, Rfl: 3   rivaroxaban (XARELTO) 20 MG TABS tablet, Take 1 tablet (20 mg total) by mouth daily with supper., Disp: 30 tablet, Rfl: 1   rosuvastatin (CRESTOR) 5 MG tablet, Take 1 tablet (5 mg total) by mouth daily. In place of Atorvastatin, Disp: 90 tablet, Rfl: 0   senna (SENOKOT) 8.6 MG tablet, Take 1 tablet by mouth daily as  needed for constipation., Disp: , Rfl:    SUMAtriptan (IMITREX) 100 MG tablet, Take 1 tablet (100 mg total) by mouth every 2 (two) hours as needed for migraine. May repeat in 2 hours if headache persists or recurs.,  Disp: 10 tablet, Rfl: 2   tiotropium (SPIRIVA HANDIHALER) 18 MCG inhalation capsule, Place 1 capsule (18 mcg total) into inhaler and inhale daily., Disp: 90 capsule, Rfl: 1   tirzepatide (MOUNJARO) 5 MG/0.5ML Pen, Inject 5 mg into the skin once a week., Disp: 6 mL, Rfl: 0   traZODone (DESYREL) 150 MG tablet, Take 1 tablet (150 mg total) by mouth at bedtime as needed for sleep., Disp: 30 tablet, Rfl: 3   valACYclovir (VALTREX) 1000 MG tablet, Take 1 tablet (1,000 mg total) by mouth 2 (two) times daily as needed. (Patient not taking: Reported on 03/10/2023), Disp: 6 tablet, Rfl: 2 No current facility-administered medications for this visit.  Facility-Administered Medications Ordered in Other Visits:    heparin lock flush 100 unit/mL, 250 Units, Intracatheter, Once PRN, Cammie Sickle, MD   ondansetron (ZOFRAN) 4 MG/2ML injection, , , ,    sodium chloride flush (NS) 0.9 % injection 10 mL, 10 mL, Intravenous, Once, Brahmanday, Govinda R, MD   sodium chloride flush (NS) 0.9 % injection 10 mL, 10 mL, Intracatheter, PRN, Cammie Sickle, MD  Allergies  Allergen Reactions   Hydroxyzine Hcl Other (See Comments)    Makes her loopy/jittery    I personally reviewed active problem list, medication list, allergies, family history, social history, health maintenance with the patient/caregiver today.   ROS  ***  Objective  There were no vitals filed for this visit.  There is no height or weight on file to calculate BMI.  Physical Exam ***   PHQ2/9:    01/19/2023   11:11 AM 01/04/2023   11:47 AM 12/12/2022    4:32 PM 09/28/2022   11:06 AM 09/28/2022   10:44 AM  Depression screen PHQ 2/9  Decreased Interest 2 2 0 3 2  Down, Depressed, Hopeless 3 2 3 3 2   PHQ - 2 Score 5 4 3 6 4   Altered sleeping 3 3 0 2 2  Tired, decreased energy 2 2 3 3 2   Change in appetite 0 0 0  0  Feeling bad or failure about yourself  2 2 3 3  0  Trouble concentrating 2 2 3 2  0  Moving slowly or  fidgety/restless 0 0 0  0  Suicidal thoughts 0 0 0  0  PHQ-9 Score 14 13 12 16 8   Difficult doing work/chores Very difficult Somewhat difficult Somewhat difficult      phq 9 is {gen pos [id number]   Fall Risk:    01/19/2023   11:02 AM 09/28/2022   10:57 AM 08/05/2022    9:18 AM 06/17/2022    2:31 PM 05/17/2022   11:24 AM  Fall Risk   Falls in the past year? 0 0 0 0 1  Number falls in past yr:  0 0 0 1  Injury with Fall?  0 0 0 0  Risk for fall due to : No Fall Risks No Fall Risks No Fall Risks No Fall Risks No Fall Risks  Follow up Falls prevention discussed;Education provided Falls prevention discussed Falls prevention discussed;Education provided Falls prevention discussed;Education provided Falls prevention discussed      Functional Status Survey:      Assessment & Plan  *** There are no diagnoses linked to this encounter.

## 2023-03-29 ENCOUNTER — Ambulatory Visit: Payer: Medicare Other | Admitting: Family Medicine

## 2023-03-30 ENCOUNTER — Ambulatory Visit
Admission: RE | Admit: 2023-03-30 | Discharge: 2023-03-30 | Disposition: A | Discharge status: Home / Self Care | Payer: Medicare Other | Source: Ambulatory Visit | Attending: Internal Medicine | Admitting: Internal Medicine

## 2023-03-30 DIAGNOSIS — C482 Malignant neoplasm of peritoneum, unspecified: Secondary | ICD-10-CM | POA: Insufficient documentation

## 2023-03-30 DIAGNOSIS — K3189 Other diseases of stomach and duodenum: Secondary | ICD-10-CM | POA: Diagnosis not present

## 2023-03-30 DIAGNOSIS — J439 Emphysema, unspecified: Secondary | ICD-10-CM | POA: Diagnosis not present

## 2023-03-30 MED ORDER — HEPARIN SOD (PORK) LOCK FLUSH 100 UNIT/ML IV SOLN
500.0000 [IU] | Freq: Once | INTRAVENOUS | Status: AC
  Administered 2023-03-30: 500 [IU] via INTRAVENOUS

## 2023-03-30 MED ORDER — IOHEXOL 300 MG/ML  SOLN
100.0000 mL | Freq: Once | INTRAMUSCULAR | Status: AC | PRN
  Administered 2023-03-30: 100 mL via INTRAVENOUS

## 2023-04-03 ENCOUNTER — Inpatient Hospital Stay (HOSPITAL_BASED_OUTPATIENT_CLINIC_OR_DEPARTMENT_OTHER): Payer: Medicare Other | Admitting: Internal Medicine

## 2023-04-03 ENCOUNTER — Inpatient Hospital Stay: Payer: Medicare Other

## 2023-04-03 ENCOUNTER — Telehealth: Payer: Medicaid Other | Admitting: Hospice and Palliative Medicine

## 2023-04-03 ENCOUNTER — Encounter: Payer: Self-pay | Admitting: Internal Medicine

## 2023-04-03 VITALS — BP 94/64 | HR 71 | Resp 18 | Wt 217.0 lb

## 2023-04-03 DIAGNOSIS — F419 Anxiety disorder, unspecified: Secondary | ICD-10-CM | POA: Diagnosis not present

## 2023-04-03 DIAGNOSIS — N951 Menopausal and female climacteric states: Secondary | ICD-10-CM | POA: Diagnosis not present

## 2023-04-03 DIAGNOSIS — C482 Malignant neoplasm of peritoneum, unspecified: Secondary | ICD-10-CM | POA: Diagnosis not present

## 2023-04-03 DIAGNOSIS — T451X5A Adverse effect of antineoplastic and immunosuppressive drugs, initial encounter: Secondary | ICD-10-CM | POA: Diagnosis not present

## 2023-04-03 DIAGNOSIS — Z9221 Personal history of antineoplastic chemotherapy: Secondary | ICD-10-CM | POA: Diagnosis not present

## 2023-04-03 DIAGNOSIS — I427 Cardiomyopathy due to drug and external agent: Secondary | ICD-10-CM | POA: Diagnosis not present

## 2023-04-03 DIAGNOSIS — Z7189 Other specified counseling: Secondary | ICD-10-CM | POA: Diagnosis not present

## 2023-04-03 DIAGNOSIS — R110 Nausea: Secondary | ICD-10-CM | POA: Diagnosis not present

## 2023-04-03 DIAGNOSIS — Z5111 Encounter for antineoplastic chemotherapy: Secondary | ICD-10-CM | POA: Diagnosis not present

## 2023-04-03 DIAGNOSIS — G4700 Insomnia, unspecified: Secondary | ICD-10-CM | POA: Diagnosis not present

## 2023-04-03 LAB — CMP (CANCER CENTER ONLY)
ALT: 16 U/L (ref 0–44)
AST: 21 U/L (ref 15–41)
Albumin: 3.9 g/dL (ref 3.5–5.0)
Alkaline Phosphatase: 57 U/L (ref 38–126)
Anion gap: 9 (ref 5–15)
BUN: 17 mg/dL (ref 6–20)
CO2: 25 mmol/L (ref 22–32)
Calcium: 8.9 mg/dL (ref 8.9–10.3)
Chloride: 103 mmol/L (ref 98–111)
Creatinine: 0.94 mg/dL (ref 0.44–1.00)
GFR, Estimated: >60 mL/min (ref >60)
Glucose, Bld: 110 mg/dL — ABNORMAL HIGH (ref 70–99)
Potassium: 3.8 mmol/L (ref 3.5–5.1)
Sodium: 137 mmol/L (ref 135–145)
Total Bilirubin: 0.5 mg/dL (ref 0.3–1.2)
Total Protein: 7.2 g/dL (ref 6.5–8.1)

## 2023-04-03 LAB — CBC WITH DIFFERENTIAL (CANCER CENTER ONLY)
Abs Immature Granulocytes: 0.03 10*3/uL (ref 0.00–0.07)
Basophils Absolute: 0.1 10*3/uL (ref 0.0–0.1)
Basophils Relative: 1 %
Eosinophils Absolute: 0.1 10*3/uL (ref 0.0–0.5)
Eosinophils Relative: 1 %
HCT: 45.9 % (ref 36.0–46.0)
Hemoglobin: 15 g/dL (ref 12.0–15.0)
Immature Granulocytes: 0 %
Lymphocytes Relative: 29 %
Lymphs Abs: 2.2 10*3/uL (ref 0.7–4.0)
MCH: 34 pg (ref 26.0–34.0)
MCHC: 32.7 g/dL (ref 30.0–36.0)
MCV: 104.1 fL — ABNORMAL HIGH (ref 80.0–100.0)
Monocytes Absolute: 0.4 10*3/uL (ref 0.1–1.0)
Monocytes Relative: 6 %
Neutro Abs: 4.9 10*3/uL (ref 1.7–7.7)
Neutrophils Relative %: 63 %
Platelet Count: 144 10*3/uL — ABNORMAL LOW (ref 150–400)
RBC: 4.41 MIL/uL (ref 3.87–5.11)
RDW: 13.4 % (ref 11.5–15.5)
WBC Count: 7.7 10*3/uL (ref 4.0–10.5)
nRBC: 0 % (ref 0.0–0.2)

## 2023-04-03 MED ORDER — PROCHLORPERAZINE MALEATE 10 MG PO TABS
10.0000 mg | ORAL_TABLET | Freq: Four times a day (QID) | ORAL | 3 refills | Status: DC | PRN
Start: 2023-04-03 — End: 2023-05-16

## 2023-04-03 MED ORDER — ONDANSETRON 8 MG PO TBDP: 8.0000 mg | ORAL_TABLET | Freq: Three times a day (TID) | ORAL | 3 refills | Status: DC | PRN
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
## 2023-04-03 NOTE — Assessment & Plan Note (Addendum)
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 APRIL 4th 2024-recurrent peritoneal carcinomatosis.  No significant ascites.  No evidence of any bowel obstruction.   # Given the recurrent/progressive disease discussed with the patient that she will need subsequent line of chemotherapy-including platinum as patient is platinum sensitive. Last carboplatin-taxol-April 2021.  Avastin/Lynparza last in dec 2023.   # Recommend chemotherapy with Doxil-carboplatin every 4 weeks. #  I reviewed at length the individual components with chemotherapy; and the schedule in detail.  I also discussed the potential side effects including but not limited to-increasing fatigue, nausea vomiting, diarrhea, hair loss, sores in the mouth, increase risk of infection and also neuropathy.  Also recommend 2D echo prior to treatment ordered ASAP.  Discussed with gynecology oncology.  Patient in general a poor candidate for clinical trial given poor social support.   # Ongoing MSK joints-February 2024 -bone scan negative for malignancy.  Currently on prednisone 5 mg/day; continue prednisone for now.  Awaiting heumatology evaluation.   # Intractable headaches Sallye Ober /nausea -JAN 2024- Brain MRI with and without contrast-nonocclusive dural venous sinus thrombosis noted-without any brain infarct or any metastatic disease to the brain.  Currently on xarelto.  Stable.  Again reviewed the importance of continued compliance with anticoagulant.  # Abdominal hernia-CT scan  DEC 2023-  ventral hernia noted; no obstruction- stable.   # PN G-2/back pain [awaiting steroid injection]- on Lyrica 50 mg TID/Cymbalta- stable.   # Nausea-sec to underlying disease.  Continue Zofran/ compazine prn monitor for now- stable.  Refilled.  # Anxiety/Insomnia--social stressors-continue Cymbalta [at 20 mg/day]; continue trazadone 150 mg qhs- see above.   # Hot flashes: on  Clonidine patch- on Friday weekly; and clonidine pill 0.1 qhs.   #IV access/Mediport-currently s/p TPA- port flush  # DISPOSITION:  #  2 D echo ASAP # follow up in 2  weeks- MD; labs- cbc/cmp; ca-125; Doxil- carbo [new]- Dr.B

## 2023-04-03 NOTE — Progress Notes (Signed)
Patient has not had a BM in 3 weeks.

## 2023-04-03 NOTE — Progress Notes (Signed)
DISCONTINUE OFF PATHWAY REGIMEN - Other   [id number]:Carboplatin AUC=6 + Paclitaxel 175 mg/m2 + Bevacizumab 7.5 mg/kg q21 Days:   A cycle is every 21 days:     Paclitaxel      Carboplatin      Bevacizumab-xxxx   **Always confirm dose/schedule in your pharmacy ordering system**  REASON: Disease Progression PRIOR TREATMENT: Carboplatin AUC=6 + Paclitaxel 175 mg/m2 + Bevacizumab 7.5 mg/kg q21 Days TREATMENT RESPONSE: Progressive Disease (PD)  START OFF PATHWAY REGIMEN - Other   [id number]:Carboplatin AUC=5 IV D1 + Liposomal Doxorubicin 30 mg/m2 IV D1 q28 Days:   A cycle is every 28 days:     Carboplatin      Liposomal doxorubicin   **Always confirm dose/schedule in your pharmacy ordering system**  Patient Characteristics: Intent of Therapy: Non-Curative / Palliative Intent, Discussed with Patient

## 2023-04-04 ENCOUNTER — Other Ambulatory Visit: Payer: Self-pay | Admitting: Internal Medicine

## 2023-04-04 LAB — CA 125: Cancer Antigen (CA) 125: 228 U/mL — ABNORMAL HIGH (ref 0.0–38.1)

## 2023-04-06 ENCOUNTER — Telehealth: Payer: Self-pay | Admitting: *Deleted

## 2023-04-06 ENCOUNTER — Inpatient Hospital Stay (HOSPITAL_BASED_OUTPATIENT_CLINIC_OR_DEPARTMENT_OTHER): Payer: Medicare Other | Admitting: Hospice and Palliative Medicine

## 2023-04-06 ENCOUNTER — Ambulatory Visit (HOSPITAL_COMMUNITY)
Admission: RE | Admit: 2023-04-06 | Discharge: 2023-04-06 | Disposition: A | Discharge status: Home / Self Care | Payer: Medicare Other | Source: Ambulatory Visit | Attending: Hospice and Palliative Medicine | Admitting: Hospice and Palliative Medicine

## 2023-04-06 DIAGNOSIS — R600 Localized edema: Secondary | ICD-10-CM

## 2023-04-06 DIAGNOSIS — Z86718 Personal history of other venous thrombosis and embolism: Secondary | ICD-10-CM

## 2023-04-06 DIAGNOSIS — M79602 Pain in left arm: Secondary | ICD-10-CM

## 2023-04-06 NOTE — Progress Notes (Signed)
Virtual Visit via Video Note  I connected with [name] on 04/06/23 at  2:30 PM EDT by a video enabled telemedicine application and verified that I am speaking with the correct person using two identifiers.  Location: Patient: Home Provider: Clinic   I discussed the limitations of evaluation and management by telemedicine and the availability of in person appointments. The patient expressed understanding and agreed to proceed.  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  Patient is also status post neoadjuvant carbo/Taxol status post maintenance Avastin/Lynparza.  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  Patient has also had chronic abdominal pain.  She was referred to palliative care to help address goals and manage ongoing symptoms.    Observations/Objective: Recent CT chest abdomen and pelvis on 03/30/2023 showed developing thickening and irregularity of the peritoneal reflection concerning for early peritoneal metastasis.  Patient is being restarted on systemic chemotherapy with carbo/paclitaxel/Bev.  She requested to be seen today virtually due to 2 days of soreness of the left upper extremity.  She felt a lump today and was concerned about possible DVT given history of blood clots.  Of note, she is on Xarelto.  Patient wonders if she might have hit her arm on something a few days ago and was unaware.  She denies other known traumas, falls or injuries.  Patient denies fever or chills.  She describes recent torticollis but denies any numbness or weakness in the upper  extremities.  Assessment and Plan: LUE pain -ultrasound was negative for DVT.  Patient is fully anticoagulated on Xarelto.  I did not see any evidence of erythema, bruising, or deformities through the camera.  Her soreness could reflect musculoskeletal injury.  I am less suspicious for cellulitis or abscess but we discussed possible empiric antibiotics if erythema develops or symptoms worsen.  Otherwise, patient agreeable to conservative measures with heat/ice/rest.  She has oxycodone to take as needed.  Follow Up Instructions: Follow-up with Dr. B as scheduled   I discussed the assessment and treatment plan with the patient. The patient was provided an opportunity to ask questions and all were answered. The patient agreed with the plan and demonstrated an understanding of the instructions.   The patient was advised to call back or seek an in-person evaluation if the symptoms worsen or if the condition fails to improve as anticipated.  I provided 15 minutes of non-face-to-face time during this encounter.   Malachy Moan, NP

## 2023-04-06 NOTE — Telephone Encounter (Signed)
Per Marcelyn Ditty, NP- schedule stat Left Upper ext doppler to r/o DVTs given pt's h/o of DVTs and symptoms. F/u in [facility] today for results.

## 2023-04-06 NOTE — Telephone Encounter (Signed)
Patient called stating that she thinks she has a blood clot in her arm; she feels a painful knot in her LUA. The arm is not swollen or red. It is warm to the touch in that area. She noticed the pain in her arm a few days ago and she found the knot last night. She has not done anything to physically cause this that she knows of. Please advise

## 2023-04-18 ENCOUNTER — Ambulatory Visit: Payer: Medicaid Other | Admitting: Internal Medicine

## 2023-04-18 ENCOUNTER — Ambulatory Visit: Payer: Medicaid Other

## 2023-04-18 ENCOUNTER — Other Ambulatory Visit: Payer: Medicaid Other

## 2023-04-19 ENCOUNTER — Ambulatory Visit
Admission: RE | Admit: 2023-04-19 | Discharge: 2023-04-19 | Disposition: A | Discharge status: Home / Self Care | Payer: Medicare Other | Source: Ambulatory Visit | Attending: Internal Medicine | Admitting: Internal Medicine

## 2023-04-19 DIAGNOSIS — F17200 Nicotine dependence, unspecified, uncomplicated: Secondary | ICD-10-CM | POA: Insufficient documentation

## 2023-04-19 DIAGNOSIS — Z0189 Encounter for other specified special examinations: Secondary | ICD-10-CM

## 2023-04-19 DIAGNOSIS — Z9221 Personal history of antineoplastic chemotherapy: Secondary | ICD-10-CM | POA: Insufficient documentation

## 2023-04-19 DIAGNOSIS — Z8543 Personal history of malignant neoplasm of ovary: Secondary | ICD-10-CM | POA: Insufficient documentation

## 2023-04-19 DIAGNOSIS — T451X5A Adverse effect of antineoplastic and immunosuppressive drugs, initial encounter: Secondary | ICD-10-CM | POA: Diagnosis not present

## 2023-04-19 DIAGNOSIS — Z08 Encounter for follow-up examination after completed treatment for malignant neoplasm: Secondary | ICD-10-CM | POA: Diagnosis not present

## 2023-04-19 DIAGNOSIS — E785 Hyperlipidemia, unspecified: Secondary | ICD-10-CM | POA: Insufficient documentation

## 2023-04-19 DIAGNOSIS — I427 Cardiomyopathy due to drug and external agent: Secondary | ICD-10-CM | POA: Insufficient documentation

## 2023-04-19 LAB — ECHOCARDIOGRAM COMPLETE
AR max vel: 2.92 cm2
AV Area VTI: 2.83 cm2
AV Area mean vel: 2.67 cm2
AV Mean grad: 4 mmHg
AV Peak grad: 7.8 mmHg
Ao pk vel: 1.4 m/s
Area-P 1/2: 3.45 cm2
Calc EF: 54.4 %
MV VTI: 3.45 cm2
S' Lateral: 3.3 cm
Single Plane A2C EF: 54 %
Single Plane A4C EF: 55.6 %

## 2023-04-19 NOTE — Progress Notes (Signed)
*  PRELIMINARY RESULTS* Echocardiogram 2D Echocardiogram has been performed.  [name] 04/19/2023, 12:11 PM

## 2023-04-20 ENCOUNTER — Other Ambulatory Visit: Payer: Self-pay | Admitting: Family Medicine

## 2023-04-20 DIAGNOSIS — I700 Atherosclerosis of aorta: Secondary | ICD-10-CM

## 2023-04-20 MED FILL — Dexamethasone Sodium Phosphate Inj 100 MG/10ML: INTRAMUSCULAR | Qty: 1 | Status: AC

## 2023-04-20 MED FILL — Fosaprepitant Dimeglumine For IV Infusion 150 MG (Base Eq): INTRAVENOUS | Qty: 5 | Status: AC

## 2023-04-21 ENCOUNTER — Inpatient Hospital Stay: Payer: Medicare Other

## 2023-04-21 ENCOUNTER — Encounter: Payer: Self-pay | Admitting: Internal Medicine

## 2023-04-21 ENCOUNTER — Inpatient Hospital Stay (HOSPITAL_BASED_OUTPATIENT_CLINIC_OR_DEPARTMENT_OTHER): Payer: Medicare Other | Admitting: Internal Medicine

## 2023-04-21 VITALS — BP 118/85 | HR 70 | Temp 96.2°F | Ht 63.0 in | Wt 215.0 lb

## 2023-04-21 DIAGNOSIS — Z5111 Encounter for antineoplastic chemotherapy: Secondary | ICD-10-CM | POA: Diagnosis not present

## 2023-04-21 DIAGNOSIS — C482 Malignant neoplasm of peritoneum, unspecified: Secondary | ICD-10-CM

## 2023-04-21 DIAGNOSIS — F419 Anxiety disorder, unspecified: Secondary | ICD-10-CM | POA: Diagnosis not present

## 2023-04-21 DIAGNOSIS — Z7189 Other specified counseling: Secondary | ICD-10-CM

## 2023-04-21 DIAGNOSIS — N951 Menopausal and female climacteric states: Secondary | ICD-10-CM | POA: Diagnosis not present

## 2023-04-21 DIAGNOSIS — R110 Nausea: Secondary | ICD-10-CM | POA: Diagnosis not present

## 2023-04-21 DIAGNOSIS — G4700 Insomnia, unspecified: Secondary | ICD-10-CM | POA: Diagnosis not present

## 2023-04-21 LAB — CMP (CANCER CENTER ONLY)
ALT: 13 U/L (ref 0–44)
AST: 19 U/L (ref 15–41)
Albumin: 3.7 g/dL (ref 3.5–5.0)
Alkaline Phosphatase: 48 U/L (ref 38–126)
Anion gap: 6 (ref 5–15)
BUN: 11 mg/dL (ref 6–20)
CO2: 25 mmol/L (ref 22–32)
Calcium: 8.8 mg/dL — ABNORMAL LOW (ref 8.9–10.3)
Chloride: 107 mmol/L (ref 98–111)
Creatinine: 0.68 mg/dL (ref 0.44–1.00)
GFR, Estimated: >60 mL/min (ref >60)
Glucose, Bld: 93 mg/dL (ref 70–99)
Potassium: 3.4 mmol/L — ABNORMAL LOW (ref 3.5–5.1)
Sodium: 138 mmol/L (ref 135–145)
Total Bilirubin: 0.4 mg/dL (ref 0.3–1.2)
Total Protein: 6.6 g/dL (ref 6.5–8.1)

## 2023-04-21 LAB — CBC WITH DIFFERENTIAL (CANCER CENTER ONLY)
Abs Immature Granulocytes: 0.02 10*3/uL (ref 0.00–0.07)
Basophils Absolute: 0.1 10*3/uL (ref 0.0–0.1)
Basophils Relative: 1 %
Eosinophils Absolute: 0.1 10*3/uL (ref 0.0–0.5)
Eosinophils Relative: 2 %
HCT: 44.2 % (ref 36.0–46.0)
Hemoglobin: 14.3 g/dL (ref 12.0–15.0)
Immature Granulocytes: 0 %
Lymphocytes Relative: 32 %
Lymphs Abs: 1.8 10*3/uL (ref 0.7–4.0)
MCH: 32.8 pg (ref 26.0–34.0)
MCHC: 32.4 g/dL (ref 30.0–36.0)
MCV: 101.4 fL — ABNORMAL HIGH (ref 80.0–100.0)
Monocytes Absolute: 0.4 10*3/uL (ref 0.1–1.0)
Monocytes Relative: 8 %
Neutro Abs: 3.2 10*3/uL (ref 1.7–7.7)
Neutrophils Relative %: 57 %
Platelet Count: 133 10*3/uL — ABNORMAL LOW (ref 150–400)
RBC: 4.36 MIL/uL (ref 3.87–5.11)
RDW: 13.4 % (ref 11.5–15.5)
WBC Count: 5.6 10*3/uL (ref 4.0–10.5)
nRBC: 0 % (ref 0.0–0.2)

## 2023-04-21 MED ORDER — SODIUM CHLORIDE 0.9% FLUSH
10.0000 mL | INTRAVENOUS | Status: DC | PRN
  Filled 2023-04-21: qty 10

## 2023-04-21 MED ORDER — HEPARIN SOD (PORK) LOCK FLUSH 100 UNIT/ML IV SOLN
500.0000 [IU] | Freq: Once | INTRAVENOUS | Status: AC | PRN
  Administered 2023-04-21: 500 [IU]
  Filled 2023-04-21: qty 5

## 2023-04-21 MED ORDER — FAMOTIDINE IN NACL 20-0.9 MG/50ML-% IV SOLN
20.0000 mg | Freq: Once | INTRAVENOUS | Status: AC
  Administered 2023-04-21: 20 mg via INTRAVENOUS
  Filled 2023-04-21: qty 50

## 2023-04-21 MED ORDER — DOXORUBICIN HCL LIPOSOMAL CHEMO INJECTION 2 MG/ML
30.0000 mg/m2 | Freq: Once | INTRAVENOUS | Status: AC
  Administered 2023-04-21: 60 mg via INTRAVENOUS
  Filled 2023-04-21: qty 30

## 2023-04-21 MED ORDER — DEXTROSE 5 % IV SOLN
Freq: Once | INTRAVENOUS | Status: AC
  Filled 2023-04-21: qty 250

## 2023-04-21 MED ORDER — SODIUM CHLORIDE 0.9 % IV SOLN
10.0000 mg | Freq: Once | INTRAVENOUS | Status: AC
  Administered 2023-04-21: 10 mg via INTRAVENOUS
  Filled 2023-04-21: qty 10

## 2023-04-21 MED ORDER — DIPHENHYDRAMINE HCL 50 MG/ML IJ SOLN
50.0000 mg | Freq: Once | INTRAMUSCULAR | Status: AC
  Administered 2023-04-21: 50 mg via INTRAVENOUS
  Filled 2023-04-21: qty 1

## 2023-04-21 MED ORDER — PALONOSETRON HCL INJECTION 0.25 MG/5ML
0.2500 mg | Freq: Once | INTRAVENOUS | Status: AC
  Administered 2023-04-21: 0.25 mg via INTRAVENOUS
  Filled 2023-04-21: qty 5

## 2023-04-21 MED ORDER — SODIUM CHLORIDE 0.9 % IV SOLN
750.0000 mg | Freq: Once | INTRAVENOUS | Status: AC
  Administered 2023-04-21: 750 mg via INTRAVENOUS
  Filled 2023-04-21: qty 75

## 2023-04-21 MED ORDER — SODIUM CHLORIDE 0.9 % IV SOLN
150.0000 mg | Freq: Once | INTRAVENOUS | Status: AC
  Administered 2023-04-21: 150 mg via INTRAVENOUS
  Filled 2023-04-21: qty 150

## 2023-04-21 NOTE — Patient Instructions (Signed)
[facility]  Discharge Instructions: Thank you for choosing [facility] to provide your oncology and hematology care.  If you have a lab appointment with the Cancer Center, please go directly to the Cancer Center and check in at the registration area.  Wear comfortable clothing and clothing appropriate for easy access to any Portacath or PICC line.   We strive to give you quality time with your provider. You may need to reschedule your appointment if you arrive late (15 or more minutes).  Arriving late affects you and other patients whose appointments are after yours.  Also, if you miss three or more appointments without notifying the office, you may be dismissed from the clinic at the provider's discretion.      For prescription refill requests, have your pharmacy contact our office and allow 72 hours for refills to be completed.    Today you received the following chemotherapy and/or immunotherapy agents doxil/carboplatin    To help prevent nausea and vomiting after your treatment, we encourage you to take your nausea medication as directed.  BELOW ARE SYMPTOMS THAT SHOULD BE REPORTED IMMEDIATELY: *FEVER GREATER THAN 100.4 F (38 C) OR HIGHER *CHILLS OR SWEATING *NAUSEA AND VOMITING THAT IS NOT CONTROLLED WITH YOUR NAUSEA MEDICATION *UNUSUAL SHORTNESS OF BREATH *UNUSUAL BRUISING OR BLEEDING *URINARY PROBLEMS (pain or burning when urinating, or frequent urination) *BOWEL PROBLEMS (unusual diarrhea, constipation, pain near the anus) TENDERNESS IN MOUTH AND THROAT WITH OR WITHOUT PRESENCE OF ULCERS (sore throat, sores in mouth, or a toothache) UNUSUAL RASH, SWELLING OR PAIN  UNUSUAL VAGINAL DISCHARGE OR ITCHING   Items with * indicate a potential emergency and should be followed up as soon as possible or go to the Emergency Department if any problems should occur.  Please show the CHEMOTHERAPY ALERT CARD or IMMUNOTHERAPY ALERT CARD at  check-in to the Emergency Department and triage nurse.  Should you have questions after your visit or need to cancel or reschedule your appointment, please contact [facility]  [phone] and follow the prompts.  Office hours are 8:00 a.m. to 4:30 p.m. Monday - Friday. Please note that voicemails left after 4:00 p.m. may not be returned until the following business day.  We are closed weekends and major holidays. You have access to a nurse at all times for urgent questions. Please call the main number to the clinic [phone] and follow the prompts.  For any non-urgent questions, you may also contact your provider using MyChart. We now offer e-Visits for anyone 33 and older to request care online for non-urgent symptoms. For details visit [email or web address].   Also download the MyChart app! Go to the app store, search "MyChart", open the app, select [facility], and log in with your MyChart username and password.  Doxorubicin Liposomal Injection What is this medication? DOXORUBICIN LIPOSOMAL (dox oh ROO bi sin LIP oh som al) treats some types of cancer. It works by slowing down the growth of cancer cells. This medicine may be used for other purposes; ask your health care provider or pharmacist if you have questions. COMMON BRAND NAME(S): Doxil, Lipodox What should I tell my care team before I take this medication? They need to know if you have any of these conditions: Blood disorder Heart disease Infection especially a viral infection, such as chickenpox, cold sores, herpes Liver disease Recent or ongoing radiation An unusual or allergic reaction to doxorubicin, soybeans, other medications, foods, dyes, or preservatives If  you or your partner are pregnant or trying to get pregnant Breast-feeding How should I use this medication? This medication is injected into a vein. It is given by your care team in a hospital or clinic setting. Talk to your care  team about the use of this medication in children. Special care may be needed. Overdosage: If you think you have taken too much of this medicine contact a poison control center or emergency room at once. NOTE: This medicine is only for you. Do not share this medicine with others. What if I miss a dose? Keep appointments for follow-up doses. It is important not to miss your dose. Call your care team if you are unable to keep an appointment. What may interact with this medication? Do not take this medication with any of the following: Zidovudine This medication may also interact with the following: Medications to increase blood counts, such as filgrastim, pegfilgrastim, sargramostim Vaccines This list may not describe all possible interactions. Give your health care provider a list of all the medicines, herbs, non-prescription drugs, or dietary supplements you use. Also tell them if you smoke, drink alcohol, or use illegal drugs. Some items may interact with your medicine. What should I watch for while using this medication? Your condition will be monitored carefully while you are receiving this medication. You may need blood work while taking this medication. This medication may make you feel generally unwell. This is not uncommon as chemotherapy can affect healthy cells as well as cancer cells. Report any side effects. Continue your course of treatment even though you feel ill unless your care team tells you to stop. Your urine may turn orange-red for a few days after your dose. This is not blood. If your urine is dark or brown, call your care team. In some cases, you may be given additional medications to help with side effects. Follow all directions for their use. Talk to your care team about your risk of cancer. You may be more at risk for certain types of cancers if you take this medication. You should make sure that you get enough Coenzyme Q10 while you are taking this medication. Discuss the  foods you eat and the vitamins you take with your care team. Talk to your care team if you or your partner may be pregnant. Serious birth defects can occur if you take this medication during pregnancy and for 6 months after the last dose. Contraception is recommended while taking this medication and for 6 months after the last dose. Your care team can help you find the option that works for you. If your partner can get pregnant, use a condom while taking this medication and for 6 months after the last dose. Do not breastfeed while taking this medication. This medication may cause infertility. Talk to your care team if you are concerned about your fertility. What side effects may I notice from receiving this medication? Side effects that you should report to your care team as soon as possible: Allergic reactions--skin rash, itching, hives, swelling of the face, lips, tongue, or throat Heart failure--shortness of breath, swelling of the ankles, feet, or hands, sudden weight gain, unusual weakness or fatigue Infection--fever, chills, cough, sore throat, wounds that don't heal, pain or trouble when passing urine, general feeling of discomfort or being unwell Infusion reactions--chest pain, shortness of breath or trouble breathing, feeling faint or lightheaded Low red blood cell level--unusual weakness or fatigue, dizziness, headache, trouble breathing Redness, swelling, and blistering of the skin  over hands and feet Unusual bruising or bleeding Side effects that usually do not require medical attention (report to your care team if they continue or are bothersome): Constipation Diarrhea Loss of appetite Nausea Pain, redness, or swelling with sores inside the mouth or throat Red urine Unusual weakness or fatigue This list may not describe all possible side effects. Call your doctor for medical advice about side effects. You may report side effects to FDA at 1-800-FDA-1088. Where should I keep my  medication? This medication is given in a hospital or clinic. It will not be stored at home. NOTE: This sheet is a summary. It may not cover all possible information. If you have questions about this medicine, talk to your doctor, pharmacist, or health care provider.  2023 Elsevier/Gold Standard (2022-04-20 00:00:00)  Carboplatin Injection What is this medication? CARBOPLATIN (KAR boe pla tin) treats some types of cancer. It works by slowing down the growth of cancer cells. This medicine may be used for other purposes; ask your health care provider or pharmacist if you have questions. COMMON BRAND NAME(S): Paraplatin What should I tell my care team before I take this medication? They need to know if you have any of these conditions: Blood disorders Hearing problems Kidney disease Recent or ongoing radiation therapy An unusual or allergic reaction to carboplatin, cisplatin, other medications, foods, dyes, or preservatives Pregnant or trying to get pregnant Breast-feeding How should I use this medication? This medication is injected into a vein. It is given by your care team in a hospital or clinic setting. Talk to your care team about the use of this medication in children. Special care may be needed. Overdosage: If you think you have taken too much of this medicine contact a poison control center or emergency room at once. NOTE: This medicine is only for you. Do not share this medicine with others. What if I miss a dose? Keep appointments for follow-up doses. It is important not to miss your dose. Call your care team if you are unable to keep an appointment. What may interact with this medication? Medications for seizures Some antibiotics, such as amikacin, gentamicin, neomycin, streptomycin, tobramycin Vaccines This list may not describe all possible interactions. Give your health care provider a list of all the medicines, herbs, non-prescription drugs, or dietary supplements you use.  Also tell them if you smoke, drink alcohol, or use illegal drugs. Some items may interact with your medicine. What should I watch for while using this medication? Your condition will be monitored carefully while you are receiving this medication. You may need blood work while taking this medication. This medication may make you feel generally unwell. This is not uncommon, as chemotherapy can affect healthy cells as well as cancer cells. Report any side effects. Continue your course of treatment even though you feel ill unless your care team tells you to stop. In some cases, you may be given additional medications to help with side effects. Follow all directions for their use. This medication may increase your risk of getting an infection. Call your care team for advice if you get a fever, chills, sore throat, or other symptoms of a cold or flu. Do not treat yourself. Try to avoid being around people who are sick. Avoid taking medications that contain aspirin, acetaminophen, ibuprofen, naproxen, or ketoprofen unless instructed by your care team. These medications may hide a fever. Be careful brushing or flossing your teeth or using a toothpick because you may get an infection or bleed  more easily. If you have any dental work done, tell your dentist you are receiving this medication. Talk to your care team if you wish to become pregnant or think you might be pregnant. This medication can cause serious birth defects. Talk to your care team about effective forms of contraception. Do not breast-feed while taking this medication. What side effects may I notice from receiving this medication? Side effects that you should report to your care team as soon as possible: Allergic reactions--skin rash, itching, hives, swelling of the face, lips, tongue, or throat Infection--fever, chills, cough, sore throat, wounds that don't heal, pain or trouble when passing urine, general feeling of discomfort or being  unwell Low red blood cell level--unusual weakness or fatigue, dizziness, headache, trouble breathing Pain, tingling, or numbness in the hands or feet, muscle weakness, change in vision, confusion or trouble speaking, loss of balance or coordination, trouble walking, seizures Unusual bruising or bleeding Side effects that usually do not require medical attention (report to your care team if they continue or are bothersome): Hair loss Nausea Unusual weakness or fatigue Vomiting This list may not describe all possible side effects. Call your doctor for medical advice about side effects. You may report side effects to FDA at 1-800-FDA-1088. Where should I keep my medication? This medication is given in a hospital or clinic. It will not be stored at home. NOTE: This sheet is a summary. It may not cover all possible information. If you have questions about this medicine, talk to your doctor, pharmacist, or health care provider.  2023 Elsevier/Gold Standard (2008-02-02 00:00:00)

## 2023-04-21 NOTE — Assessment & Plan Note (Addendum)
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 APRIL 4th 2024-recurrent peritoneal carcinomatosis.  No significant ascites.  No evidence of any bowel obstruction.  Last carboplatin-taxol-April 2021. DISCONTINUE Avastin/Lynparza last in dec 2023.   # Recommend chemotherapy with Doxil-carboplatin every 4 weeks. Understands the palliative nature of treatments.  # proceed with cycle #1 Doxil-carboplatin - CBC CMP are reviewed.  #  I reviewed at length the individual components with chemotherapy; and the schedule in detail.  I also discussed the potential side effects including but not limited to-increasing fatigue, nausea vomiting, diarrhea, hair loss, sores in the mouth, increase risk of infection and also neuropathy.  APRIL 2024- 2D echo- 55-60%..   Discussed with gynecology oncology.   # Hypocalcemia- check Vit D levels; Hypokalemia- mild- monitor  # Ongoing MSK joints-February 2024 -bone scan negative for malignancy.  Currently on prednisone 10 mg/day; continue prednisone for now.  Stable.    # AN 2024- Brain MRI with and without contrast-nonocclusive dural venous sinus thrombosis noted-  Currently on xarelto.  Stable.    # PN G-2/back pain [awaiting steroid injection]- on Lyrica 50 mg TID/Cymbalta- stable.   # Nausea-sec to underlying disease.  Continue Zofran/ compazine prn monitor for now- stable.   # Anxiety/Insomnia--social stressors-continue Cymbalta [at 20 mg/day]; continue trazadone 150 mg qhs- see above.   # Hot flashes: on Clonidine patch- on Friday weekly; and clonidine pill 0.1 qhs. stable.   #IV access/Mediport-currently s/p TPA- port flush.   # DISPOSITION:  # chemo today- wait for cbc # follow up in 4 weeks- MD; labs- cbc/cmp; ca-125; Doxil- carbo-  Dr.B

## 2023-04-21 NOTE — Progress Notes (Signed)
No concerns today 

## 2023-04-21 NOTE — Progress Notes (Signed)
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 mass surrounded by area of hyperechoic subcutaneous tissue. This may represent bruising/fat necrosis or small subcutaneous abscess. Please correlate clinically. Electronically Signed   By: Ted Mcalpine M.D.   On: 04/06/2023 11:27   CT CHEST ABDOMEN PELVIS W CONTRAST  Result Date: 03/30/2023 CLINICAL DATA:  Ovarian cancer with hysterectomy and omentectomy in 2021. Chemotherapy stopped secondary to blood clots. Elevated tumor markers. Smoker. * Tracking Code: BO * EXAM: CT CHEST, ABDOMEN, AND PELVIS WITH CONTRAST TECHNIQUE: Multidetector CT imaging of the chest, abdomen and pelvis was performed following the standard protocol during bolus administration of intravenous contrast. RADIATION DOSE REDUCTION: This exam was performed according to the departmental dose-optimization program which includes automated exposure  control, adjustment of the mA and/or kV according to patient size and/or use of iterative reconstruction technique. CONTRAST:  OMNIPAQUE IOHEXOL 300 MG/ML  SOLN COMPARISON:  12/13/2022 FINDINGS: CT CHEST FINDINGS Cardiovascular: Right Port-A-Cath tip right Port-A-Cath tip low right atrium. Normal aortic caliber. Borderline cardiomegaly. No central pulmonary embolism, on this non-dedicated study. Mediastinum/Nodes: No supraclavicular adenopathy. No mediastinal or hilar adenopathy. Lungs/Pleura: Trace right pleural fluid is similar to minimally increased on 49/2. Mild centrilobular emphysema. Minimal dependent right lower lobe subsegmental atelectasis. Anterior left lower lobe scarring. Musculoskeletal: No acute osseous abnormality. CT ABDOMEN PELVIS FINDINGS Hepatobiliary: Normal liver. Normal gallbladder, without biliary ductal dilatation. Pancreas: Normal, without mass or ductal dilatation. Spleen: Normal in size, without focal abnormality. Adrenals/Urinary Tract: Normal adrenal glands. Normal kidneys, without hydronephrosis. Normal urinary bladder. Stomach/Bowel: Normal stomach, without wall thickening. Ventral abdominal wall laxity again contains nonobstructive large and small bowel. Colonic stool burden suggests constipation. Normal terminal ileum. Otherwise normal small bowel. Vascular/Lymphatic: Aortic atherosclerosis. No abdominopelvic adenopathy. Reproductive: Hysterectomy.  No adnexal mass. Other: No significant free fluid. Most apparent on coronal reformatted images is thickening and mild irregularity of the peritoneal reflexion. Example image 78 of series 4 today versus image 83 coronal on 08/30/2022. Musculoskeletal: Mild L4-5 and L5-S1 disc bulges. IMPRESSION: 1. Developing thickening and irregularity of the peritoneal reflection especially compared back to 08/30/2022, suspicious for early/subtle peritoneal metastasis. This could be re-evaluated on three-month follow-up CT or more entirely  characterized with PET. 2. Otherwise, no evidence of metastatic disease. 3. Trace right pleural fluid, similar to slightly increased. 4. Similar large and small bowel containing ventral abdominal wall laxity. 5. Aortic atherosclerosis (ICD10-I70.0) and emphysema (ICD10-J43.9). 6. Port-A-Cath tip at low right atrium. 7.  Possible constipation. Electronically Signed   By: Jeronimo Greaves M.D.   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 APRIL 4th 2024-recurrent peritoneal carcinomatosis.  No significant ascites.  No evidence of any bowel obstruction.  Last carboplatin-taxol-April 2021. DISCONTINUE Avastin/Lynparza last in dec 2023.   # Recommend chemotherapy with Doxil-carboplatin every 4 weeks. Understands the palliative nature of treatments.  # proceed with cycle #1 Doxil-carboplatin - CBC CMP are reviewed.  #  I reviewed at length the individual components with chemotherapy; and the schedule in detail.  I also discussed the potential side effects including but not limited to-increasing fatigue, nausea vomiting, diarrhea, hair loss, sores in the mouth, increase risk of infection and also neuropathy.  APRIL 2024- 2D echo- 55-60%..   Discussed with gynecology oncology.   # Hypocalcemia- check Vit D levels; Hypokalemia- mild- monitor  # Ongoing MSK joints-February 2024 -bone scan negative for malignancy.  Currently on prednisone 10 mg/day; continue prednisone for now.  Stable.    # AN 2024- Brain MRI with and without contrast-nonocclusive dural venous sinus thrombosis noted-  Currently on xarelto.  Stable.    # PN G-2/back pain [awaiting steroid injection]- on Lyrica 50 mg TID/Cymbalta- stable.   # Nausea-sec to underlying disease.  Continue Zofran/ compazine prn monitor for now- stable.   # Anxiety/Insomnia--social stressors-continue Cymbalta [at 20  mg/day]; continue trazadone 150 mg qhs- see above.   # Hot flashes: on Clonidine patch- on Friday weekly; and clonidine pill 0.1 qhs. stable.   #IV access/Mediport-currently s/p TPA- port flush.   # DISPOSITION:  # chemo today- wait for cbc # follow up in 4 weeks- MD; labs- cbc/cmp; ca-125; Doxil- carbo-  Dr.B      Earna Coder, MD 04/21/2023 11:16 AM

## 2023-04-22 LAB — CA 125: Cancer Antigen (CA) 125: 291 U/mL — ABNORMAL HIGH (ref 0.0–38.1)

## 2023-04-26 ENCOUNTER — Encounter: Payer: Self-pay | Admitting: *Deleted

## 2023-04-26 ENCOUNTER — Encounter: Payer: Self-pay | Admitting: Hospice and Palliative Medicine

## 2023-04-27 ENCOUNTER — Encounter: Payer: Self-pay | Admitting: *Deleted

## 2023-04-27 ENCOUNTER — Telehealth: Payer: Self-pay | Admitting: *Deleted

## 2023-04-27 ENCOUNTER — Other Ambulatory Visit: Payer: Self-pay | Admitting: *Deleted

## 2023-04-27 DIAGNOSIS — R112 Nausea with vomiting, unspecified: Secondary | ICD-10-CM

## 2023-04-27 DIAGNOSIS — C482 Malignant neoplasm of peritoneum, unspecified: Secondary | ICD-10-CM

## 2023-04-27 MED ORDER — OXYCODONE HCL 5 MG PO TABS: 5.0000 mg | ORAL_TABLET | Freq: Three times a day (TID) | ORAL | 0 refills | Status: DC | PRN

## 2023-04-27 NOTE — Telephone Encounter (Signed)
Patient called stating that she is not doing well since her last treatment. She is having pain in her feet and legs, she is bloated and having abdominal pain. She states she has had constipation since starting her chemotherapy and that her last bowel movement was last week. She is however passing gas and also burping. She has been nauseated and is taking her antiemetics. She not able to eat as the though makes her more nauseated. She states that her bloating is making her stomach so tight that she feels she is going to split open. She is aware that it is too late today to come in but is willing to have an appointment in the morning. Please advise

## 2023-04-27 NOTE — Telephone Encounter (Signed)
Request for refills sent to MD

## 2023-04-28 ENCOUNTER — Other Ambulatory Visit: Payer: Self-pay

## 2023-04-28 ENCOUNTER — Encounter: Payer: Self-pay | Admitting: Hospice and Palliative Medicine

## 2023-04-28 ENCOUNTER — Inpatient Hospital Stay: Payer: Medicare Other

## 2023-04-28 ENCOUNTER — Inpatient Hospital Stay: Payer: Medicare Other | Attending: Internal Medicine

## 2023-04-28 ENCOUNTER — Inpatient Hospital Stay (HOSPITAL_BASED_OUTPATIENT_CLINIC_OR_DEPARTMENT_OTHER): Payer: Medicare Other | Admitting: Hospice and Palliative Medicine

## 2023-04-28 VITALS — BP 115/77 | HR 71 | Temp 97.9°F | Resp 18 | Ht 63.0 in | Wt 211.0 lb

## 2023-04-28 DIAGNOSIS — K5900 Constipation, unspecified: Secondary | ICD-10-CM | POA: Insufficient documentation

## 2023-04-28 DIAGNOSIS — R110 Nausea: Secondary | ICD-10-CM | POA: Diagnosis not present

## 2023-04-28 DIAGNOSIS — C482 Malignant neoplasm of peritoneum, unspecified: Secondary | ICD-10-CM

## 2023-04-28 DIAGNOSIS — Z79899 Other long term (current) drug therapy: Secondary | ICD-10-CM | POA: Insufficient documentation

## 2023-04-28 DIAGNOSIS — R61 Generalized hyperhidrosis: Secondary | ICD-10-CM | POA: Diagnosis not present

## 2023-04-28 DIAGNOSIS — R112 Nausea with vomiting, unspecified: Secondary | ICD-10-CM

## 2023-04-28 DIAGNOSIS — N951 Menopausal and female climacteric states: Secondary | ICD-10-CM | POA: Diagnosis not present

## 2023-04-28 LAB — CBC WITH DIFFERENTIAL (CANCER CENTER ONLY)
Abs Immature Granulocytes: 0.05 10*3/uL (ref 0.00–0.07)
Basophils Absolute: 0 10*3/uL (ref 0.0–0.1)
Basophils Relative: 0 %
Eosinophils Absolute: 0 10*3/uL (ref 0.0–0.5)
Eosinophils Relative: 1 %
HCT: 41.5 % (ref 36.0–46.0)
Hemoglobin: 13.6 g/dL (ref 12.0–15.0)
Immature Granulocytes: 1 %
Lymphocytes Relative: 27 %
Lymphs Abs: 1.2 10*3/uL (ref 0.7–4.0)
MCH: 32.6 pg (ref 26.0–34.0)
MCHC: 32.8 g/dL (ref 30.0–36.0)
MCV: 99.5 fL (ref 80.0–100.0)
Monocytes Absolute: 0.1 10*3/uL (ref 0.1–1.0)
Monocytes Relative: 3 %
Neutro Abs: 3.2 10*3/uL (ref 1.7–7.7)
Neutrophils Relative %: 68 %
Platelet Count: 121 10*3/uL — ABNORMAL LOW (ref 150–400)
RBC: 4.17 MIL/uL (ref 3.87–5.11)
RDW: 13.2 % (ref 11.5–15.5)
WBC Count: 4.7 10*3/uL (ref 4.0–10.5)
nRBC: 0 % (ref 0.0–0.2)

## 2023-04-28 LAB — CMP (CANCER CENTER ONLY)
ALT: 16 U/L (ref 0–44)
AST: 26 U/L (ref 15–41)
Albumin: 3.7 g/dL (ref 3.5–5.0)
Alkaline Phosphatase: 50 U/L (ref 38–126)
Anion gap: 9 (ref 5–15)
BUN: 20 mg/dL (ref 6–20)
CO2: 26 mmol/L (ref 22–32)
Calcium: 8.9 mg/dL (ref 8.9–10.3)
Chloride: 100 mmol/L (ref 98–111)
Creatinine: 0.9 mg/dL (ref 0.44–1.00)
GFR, Estimated: >60 mL/min (ref >60)
Glucose, Bld: 139 mg/dL — ABNORMAL HIGH (ref 70–99)
Potassium: 3.6 mmol/L (ref 3.5–5.1)
Sodium: 135 mmol/L (ref 135–145)
Total Bilirubin: 1.1 mg/dL (ref 0.3–1.2)
Total Protein: 6.9 g/dL (ref 6.5–8.1)

## 2023-04-28 MED ORDER — HEPARIN SOD (PORK) LOCK FLUSH 100 UNIT/ML IV SOLN
500.0000 [IU] | Freq: Once | INTRAVENOUS | Status: AC
  Administered 2023-04-28: 500 [IU] via INTRAVENOUS
  Filled 2023-04-28: qty 5

## 2023-04-28 MED ORDER — SODIUM CHLORIDE 0.9% FLUSH
10.0000 mL | Freq: Once | INTRAVENOUS | Status: AC
  Administered 2023-04-28: 10 mL via INTRAVENOUS
  Filled 2023-04-28: qty 10

## 2023-04-28 NOTE — Progress Notes (Signed)
[facility] [phone] [phone]  Patient Care Team: Brianna Cory, MD as PCP - General (Family Medicine) Brianna Odea, MD as PCP - Cardiology (Cardiology) Brianna Gutter, RN as Oncology Nurse Navigator [name], Brianna Eastern, NP as Nurse Practitioner (Hospice and Palliative Medicine) Brianna Coder, MD as Consulting Physician (Internal Medicine) Brianna Laroche, MD as Referring Physician (Obstetrics) [name], Brianna Pew, MD as Consulting Physician (General Surgery) Brianna Butte, RN as Registered Nurse (Oncology)   NAME OF PATIENT: [name]  [id number]  01-May-1971   DATE OF VISIT: 04/28/23  REASON FOR CONSULT: [name] is a 52 y.o. 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  Patient is also status post neoadjuvant carbo/Taxol on maintenance Avastin/Lynparza.   INTERVAL HISTORY: Patient last saw Dr. Donneta [name] on 04/21/2023.  She has been off maintenance olaparib-Avastin for past 3 months due to blood clot and progressive myalgias.  Patient is now on Xarelto and prednisone.  CT of the chest abdomen pelvis on 4//24 suggested developing thickening and irregularity the peritoneal reflection suspicious for early peritoneal metastasis but otherwise no evidence of metastatic disease.  Patient was restarted on systemic chemotherapy with Doxil carboplatin and received cycle 1 on 04/21/2023.  Patient presents [facility] today with complaint of generalized pain, nausea, bloating, and constipation.  Patient reports a solitary episode of vomiting earlier this week and has been taking  nausea medication intermittently.  She does have bloating and constipation with last bowel movement last week.  However, she states that she is passing gas and feels like she will soon have a bowel movement.  No changes in oral intake.  No abdominal distention.  Patient says most distressing, she has been having intense night sweats unrelieved with regimen of duloxetine, Lyrica and clonidine.  She has felt fairly rundown since her last chemotherapy treatment.  Denies recent fevers or illnesses. Denies any easy bleeding or bruising. Reports good appetite and denies weight loss. Denies chest pain. Denies any nausea, vomiting, constipation, or diarrhea. Denies urinary complaints. Patient offers no further specific complaints today.   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   Depression    Drug-induced androgenic alopecia    Dyslipidemia 05/17/2022   Dysrhythmia    Family history of breast cancer    GERD (gastroesophageal reflux disease)    Goals of care, counseling/discussion 12/16/2019   Hypertension    Insomnia    Menorrhagia    Migraines    Osteoarthritis    back   Ovarian cancer (HCC) 12/10/2019   Personal history of chemotherapy    ovarian cancer   Plantar fasciitis     PAST SURGICAL HISTORY:  Past Surgical History:  Procedure Laterality Date   ABDOMINAL HYSTERECTOMY  03/2020   APPENDECTOMY     LSC but "ruptured when they did the surgery"   BREAST BIOPSY Left 01/04/2021   MRI BX   CESAREAN SECTION     CYSTOSCOPY N/A 04/08/2020   Procedure: CYSTOSCOPY;  Surgeon: Brianna Laroche, MD;  [name];  Service: Gynecology;  [name];   INSERTION OF MESH N/A 04/26/2021   Procedure: INSERTION OF MESH;  Surgeon:  Brianna Mayotte, MD;  [name];  Service: General;  [name];   IR THORACENTESIS ASP PLEURAL SPACE W/IMG GUIDE  12/06/2019   IUD REMOVAL N/A 04/08/2020   Procedure: INTRAUTERINE DEVICE (IUD) REMOVAL;  Surgeon: Brianna Laroche, MD;  [name];  Service: Gynecology;  [name];   PARACENTESIS     x6   PORTA CATH INSERTION N/A 04/23/2020   Procedure: PORTA CATH INSERTION;  Surgeon: Brianna Needy, MD;  Location: [facility] INVASIVE CV LAB;  Service: Cardiovascular;  [name];   TUBAL LIGATION     at time of CSxn   VENTRAL HERNIA REPAIR N/A 04/26/2021   Procedure: HERNIA REPAIR VENTRAL ADULT;  Surgeon: Brianna Mayotte, MD;  [name];  Service: General;  [name];  need RNFA for the case   WRIST SURGERY Left 11/21/2016   plates and screws inserted    HEMATOLOGY/ONCOLOGY HISTORY:  Oncology History Overview Note  # DEC 2020- ADENO CA [s/p Pleural effusion]; [name]; [name]; abdominal ascites status post paracentesis x2; adenocarcinoma; [name]; omental caking/peritoneal disease/no obvious evidence of bowel involvement; no adnexal masses readily noted; Ca [phone].   # 12/23/2019- Carbo-Taxol #1; Jan 18 th 2021- #2 carbo-Taxol-Bev status post 4 cycles-April 08, 2020-debulking surgery [Dr. Secord] miliary disease noted post surgery. Carbo-Taxol-Avastin x6  # July 6th 2021- Avastin q 3 W+ OLAPARIB 300 mg BID  # OCT 26th, 2021-recurrent anemia [hemoglobin 7.5]; [name]; [name], [name]; [name]; [name], 2022 ~restart olaparib 200 mg twice daily_+ Avastin.   # Jan 15th 2021- L UE SVTxarelto; [name]; April 15th  2021-started Xarelto 20 mg post surgery; mid May 2021-Xarelto 10 mg a day/prophylaxis.  # JAN 2024- Brain MRI with and without contrast-nonocclusive dural venous sinus thrombosis noted-without any brain infarct or any metastatic disease to the brain.  Lovenox/ NOAK for long-term needs. HOLD avastin.  #   # BRCA-1 [on screening; s/p genetics counseling; [name]; July 2019- 2-3cm-right complex ovarian cyst- likely benign/hemorrhagic [also 2011].  # #December 2021 screening breast MRI-left breast 9 mm lesion biopsy; apocrine metaplasia/benign; annual MRI.   # # NGS/MOLECULAR TESTS:P    # PALLIATIVE CARE EVALUATION:P  # PAIN MANAGEMENT: NA  DIAGNOSIS: Primary peritoneal adenocarcinoma  STAGE:   IV      ;  GOALS: control  CURRENT/MOST RECENT THERAPY : Avastin maintenace    Primary peritoneal carcinomatosis (HCC)  12/16/2019 Initial Diagnosis   Primary peritoneal adenocarcinoma (HCC)   12/23/2019 - 07/08/2022 Chemotherapy   Patient is on Treatment Plan : Carboplatin + Paclitaxel + Mvasi q21d     12/23/2019 - 12/20/2022 Chemotherapy   Patient is on Treatment Plan : OVARIAN Carboplatin + Paclitaxel + Bevacizumab q21d      01/07/2021 Cancer Staging   Staging form: Ovary, Fallopian Tube, and Primary Peritoneal Carcinoma, AJCC 8th Edition - Clinical: Stage IVA (pM1a) - Signed by Brianna Coder, MD on 01/07/2021   04/21/2023 -  Chemotherapy   Patient is on Treatment Plan : OVARIAN RECURRENT Liposomal Doxorubicin + Carboplatin q28d X 6  Cycles       ALLERGIES:  is allergic to hydroxyzine hcl.  MEDICATIONS:  Current Outpatient Medications  Medication Sig Dispense Refill   buPROPion (WELLBUTRIN XL) 300 MG 24 hr tablet Take 1 tablet (300 mg total) by mouth daily. 90 tablet 0   cariprazine (VRAYLAR) 1.5 MG capsule Take 1 capsule (1.5 mg total) by mouth daily. 90 capsule 0   cloNIDine (CATAPRES - DOSED IN MG/24 HR) 0.3 mg/24hr patch Place 1 patch (0.3 mg total) onto the  skin once a week. Friday 12 patch 3   cloNIDine (CATAPRES) 0.1 MG tablet Take 1 tablet (0.1 mg total) by mouth at bedtime. 90 tablet 1   cyclobenzaprine (FLEXERIL) 5 MG tablet Take 5 mg by mouth 3 (three) times daily as needed for muscle spasms.     DULoxetine (CYMBALTA) 60 MG capsule TAKE 1 CAPSULE(60 MG) BY MOUTH DAILY 90 capsule 0   LORazepam (ATIVAN) 0.5 MG tablet Take 1 tablet (0.5 mg total) by mouth every 12 (twelve) hours as needed for anxiety. 60 tablet 0   magnesium citrate SOLN 1 Bottle as needed.     meclizine (ANTIVERT) 25 MG tablet 1 pill at night as needed; if dizziness not improved can take one pill every 8 hours as needed/tolerated. (Patient taking differently: Take 25 mg by mouth 3 (three) times daily as needed for dizziness.) 30 tablet 0   Multiple Vitamin (MULTIVITAMIN WITH MINERALS) TABS tablet Take 1 tablet by mouth daily.     nystatin (MYCOSTATIN/NYSTOP) powder Apply 1 Application topically 3 (three) times daily. (Patient taking differently: Apply 1 Application topically 3 (three) times daily as needed (rash).) 60 g 0   omeprazole (PRILOSEC) 20 MG capsule TAKE 1 CAPSULE(20 MG) BY MOUTH DAILY (Patient taking differently: Take 20 mg by mouth daily. TAKE 1 CAPSULE(20 MG) BY MOUTH DAILY) 90 capsule 2   ondansetron (ZOFRAN-ODT) 8 MG disintegrating tablet Take 1 tablet (8 mg total) by mouth every 8 (eight) hours as needed for nausea or vomiting. 60 tablet 3   oxyCODONE (OXY IR/ROXICODONE) 5 MG immediate release tablet Take 1 tablet (5 mg total) by mouth every 8 (eight) hours as needed for severe pain. 60 tablet 0   polyethylene glycol powder (GLYCOLAX/MIRALAX) 17 GM/SCOOP powder Take 0.5 Containers by mouth daily as needed for mild constipation or moderate constipation.     predniSONE (DELTASONE) 10 MG tablet Take 1 tablet (10 mg total) by mouth daily with breakfast. 30 tablet 0   pregabalin (LYRICA) 150 MG capsule Take 150 mg by mouth 3 (three) times daily.     prochlorperazine  (COMPAZINE) 10 MG tablet Take 1 tablet (10 mg total) by mouth every 6 (six) hours as needed for nausea or vomiting. 40 tablet 3   rivaroxaban (XARELTO) 20 MG TABS tablet Take 1 tablet (20 mg total) by mouth daily with supper. 30 tablet 1   rosuvastatin (CRESTOR) 5 MG tablet TAKE 1 TABLET BY MOUTH ONCE DAILY. IN PLACE OF ATORVASTATIN 90 tablet 0   senna (SENOKOT) 8.6 MG tablet Take 1 tablet by mouth daily as needed for constipation.     SUMAtriptan (IMITREX) 100 MG tablet Take 1 tablet (100 mg total) by mouth every 2 (two) hours as needed for migraine. May repeat in 2 hours if headache persists or recurs. 10 tablet 2   tiotropium (SPIRIVA HANDIHALER) 18 MCG inhalation capsule Place 1 capsule (18 mcg total) into inhaler and inhale daily. 90 capsule 1   tirzepatide (MOUNJARO) 5 MG/0.5ML Pen Inject 5 mg  into the skin once a week. 6 mL 0   traZODone (DESYREL) 150 MG tablet Take 1 tablet (150 mg total) by mouth at bedtime as needed for sleep. 30 tablet 3   valACYclovir (VALTREX) 1000 MG tablet Take 1 tablet (1,000 mg total) by mouth 2 (two) times daily as needed. 6 tablet 2   No current facility-administered medications for this visit.   Facility-Administered Medications Ordered in Other Visits  Medication Dose Route Frequency Provider Last Rate Last Admin   heparin lock flush 100 unit/mL  250 Units Intracatheter Once PRN Brianna Coder, MD       heparin lock flush 100 unit/mL  500 Units Intravenous Once Matin Mattioli, Brianna Eastern, NP       ondansetron [facility]) 4 MG/2ML injection            sodium chloride flush (NS) 0.9 % injection 10 mL  10 mL Intravenous Once Louretta Shorten R, MD       sodium chloride flush (NS) 0.9 % injection 10 mL  10 mL Intravenous Once Adalin Vanderploeg, Brianna Eastern, NP        VITAL SIGNS: LMP 06/19/2017  There were no vitals filed for this visit.  Estimated body mass index is 38.09 kg/m as calculated from the following:   Height as of 04/21/23: 5\' 3"  (1.6 m).   Weight as of  04/21/23: 215 lb (97.5 kg).  LABS: CBC:    Component Value Date/Time   WBC 5.6 04/21/2023 0919   WBC 4.8 03/10/2023 1302   HGB 14.3 04/21/2023 0919   HCT 44.2 04/21/2023 0919   PLT 133 (L) 04/21/2023 0919   MCV 101.4 (H) 04/21/2023 0919   NEUTROABS 3.2 04/21/2023 0919   LYMPHSABS 1.8 04/21/2023 0919   MONOABS 0.4 04/21/2023 0919   EOSABS 0.1 04/21/2023 0919   BASOSABS 0.1 04/21/2023 0919   Comprehensive Metabolic Panel:    Component Value Date/Time   NA 138 04/21/2023 0916   K 3.4 (L) 04/21/2023 0916   CL 107 04/21/2023 0916   CO2 25 04/21/2023 0916   BUN 11 04/21/2023 0916   CREATININE 0.68 04/21/2023 0916   CREATININE 0.79 10/25/2019 0000   GLUCOSE 93 04/21/2023 0916   CALCIUM 8.8 (L) 04/21/2023 0916   AST 19 04/21/2023 0916   ALT 13 04/21/2023 0916   ALKPHOS 48 04/21/2023 0916   BILITOT 0.4 04/21/2023 0916   PROT 6.6 04/21/2023 0916   ALBUMIN 3.7 04/21/2023 0916    RADIOGRAPHIC STUDIES: ECHOCARDIOGRAM COMPLETE  Result Date: 04/19/2023    ECHOCARDIOGRAM REPORT   Patient Name:   KOURTNEY KNAPPER Date of Exam: 04/19/2023 Medical Rec #:  [id number]        Height:       63.0 in Accession #:    [id number]       Weight:       217.0 lb Date of Birth:  1971-01-23         BSA:          2.002 m Patient Age:    52 years         BP:           94/64 mmHg Patient Gender: F                HR:           60 bpm. Exam Location:  [facility] Procedure: 2D Echo, Cardiac Doppler, Color Doppler and Strain Analysis Indications:     Chemo  History:  Patient has prior history of Echocardiogram examinations, most                  recent 04/02/2020. Cardiomyopathy, Arrythmias:Tachycardia; Risk                  Factors:Dyslipidemia and Current Smoker. Ovarian CA with Mets.  Sonographer:     Mikki Harbor Referring Phys:  [id number] [name] Diagnosing Phys: Brianna Odea MD  Sonographer Comments: Patient is obese. Global longitudinal strain was attempted. IMPRESSIONS  1. Left ventricular  ejection fraction, by estimation, is 55 to 60%. Left ventricular ejection fraction by 2D MOD biplane is 54.4 %. The left ventricle has normal function. The left ventricle has no regional wall motion abnormalities. Left ventricular diastolic parameters were normal. The average left ventricular global longitudinal strain is -17.9 %. The global longitudinal strain is normal.  2. Right ventricular systolic function is normal. The right ventricular size is normal. There is normal pulmonary artery systolic pressure.  3. The mitral valve is normal in structure. Trivial mitral valve regurgitation.  4. The aortic valve is tricuspid. Aortic valve regurgitation is not visualized.  5. The inferior vena cava is normal in size with greater than 50% respiratory variability, suggesting right atrial pressure of 3 mmHg. FINDINGS  Left Ventricle: Left ventricular ejection fraction, by estimation, is 55 to 60%. Left ventricular ejection fraction by 2D MOD biplane is 54.4 %. The left ventricle has normal function. The left ventricle has no regional wall motion abnormalities. The average left ventricular global longitudinal strain is -17.9 %. The global longitudinal strain is normal. The left ventricular internal cavity size was normal in size. There is no left ventricular hypertrophy. Left ventricular diastolic parameters were normal. Right Ventricle: The right ventricular size is normal. No increase in right ventricular wall thickness. Right ventricular systolic function is normal. There is normal pulmonary artery systolic pressure. The tricuspid regurgitant velocity is 1.94 m/s, and  with an assumed right atrial pressure of 3 mmHg, the estimated right ventricular systolic pressure is 18.1 mmHg. Left Atrium: Left atrial size was normal in size. Right Atrium: Right atrial size was normal in size. Pericardium: There is no evidence of pericardial effusion. Mitral Valve: The mitral valve is normal in structure. Trivial mitral valve  regurgitation. MV peak gradient, 2.7 mmHg. The mean mitral valve gradient is 1.0 mmHg. Tricuspid Valve: The tricuspid valve is normal in structure. Tricuspid valve regurgitation is mild. Aortic Valve: The aortic valve is tricuspid. Aortic valve regurgitation is not visualized. Aortic valve mean gradient measures 4.0 mmHg. Aortic valve peak gradient measures 7.8 mmHg. Aortic valve area, by VTI measures 2.83 cm. Pulmonic Valve: The pulmonic valve was normal in structure. Pulmonic valve regurgitation is mild. Aorta: The aortic root is normal in size and structure. Venous: The inferior vena cava is normal in size with greater than 50% respiratory variability, suggesting right atrial pressure of 3 mmHg. IAS/Shunts: No atrial level shunt detected by color flow Doppler.  LEFT VENTRICLE PLAX 2D                        Biplane EF (MOD) LVIDd:         4.60 cm         LV Biplane EF:   Left LVIDs:         3.30 cm  ventricular LV PW:         0.90 cm                          ejection LV IVS:        0.80 cm                          fraction by LVOT diam:     2.10 cm                          2D MOD LV SV:         96                               biplane is LV SV Index:   48                               54.4 %. LVOT Area:     3.46 cm                                Diastology                                LV e' medial:    7.07 cm/s LV Volumes (MOD)               LV E/e' medial:  12.0 LV vol d, MOD    72.0 ml       LV e' lateral:   13.30 cm/s A2C:                           LV E/e' lateral: 6.4 LV vol d, MOD    50.0 ml A4C:                           2D LV vol s, MOD    33.1 ml       Longitudinal A2C:                           Strain LV vol s, MOD    22.2 ml       2D Strain GLS  -17.9 % A4C:                           Avg: LV SV MOD A2C:   38.9 ml LV SV MOD A4C:   50.0 ml LV SV MOD BP:    33.8 ml RIGHT VENTRICLE RV Basal diam:  3.30 cm RV Mid diam:    2.60 cm RV S prime:     10.70 cm/s TAPSE (M-mode): 2.5 cm  LEFT ATRIUM             Index        RIGHT ATRIUM           Index LA diam:        3.60 cm 1.80 cm/m   RA Area:     16.60 cm LA Vol (A2C):   55.0 ml 27.47 ml/m  RA Volume:   42.90 ml  21.43 ml/m LA Vol (A4C):   30.2 ml 15.09 ml/m LA Biplane Vol: 42.0 ml 20.98 ml/m  AORTIC VALVE                    PULMONIC VALVE AV Area (Vmax):    2.92 cm     PV Vmax:       0.83 m/s AV Area (Vmean):   2.67 cm     PV Peak grad:  2.8 mmHg AV Area (VTI):     2.83 cm AV Vmax:           140.00 cm/s AV Vmean:          90.900 cm/s AV VTI:            0.339 m AV Peak Grad:      7.8 mmHg AV Mean Grad:      4.0 mmHg LVOT Vmax:         118.00 cm/s LVOT Vmean:        70.200 cm/s LVOT VTI:          0.277 m LVOT/AV VTI ratio: 0.82  AORTA Ao Root diam: 3.30 cm MITRAL VALVE               TRICUSPID VALVE MV Area (PHT): 3.45 cm    TR Peak grad:   15.1 mmHg MV Area VTI:   3.45 cm    TR Vmax:        194.00 cm/s MV Peak grad:  2.7 mmHg MV Mean grad:  1.0 mmHg    SHUNTS MV Vmax:       0.82 m/s    Systemic VTI:  0.28 m MV Vmean:      41.2 cm/s   Systemic Diam: 2.10 cm MV Decel Time: 220 msec MV E velocity: 84.90 cm/s MV A velocity: 72.30 cm/s MV E/A ratio:  1.17 Brianna Odea MD Electronically signed by Brianna Odea MD Signature Date/Time: 04/19/2023/1:35:02 PM    Final    US Venous Img Upper Uni Left  Result Date: 04/06/2023 CLINICAL DATA:  Left upper extremity edema and pain for a few days. EXAM: LEFT UPPER EXTREMITY VENOUS DOPPLER ULTRASOUND TECHNIQUE: Gray-scale sonography with graded compression, as well as color Doppler and duplex ultrasound were performed to evaluate the upper extremity deep venous system from the level of the subclavian vein and including the jugular, axillary, basilic, radial, ulnar and upper cephalic vein. Spectral Doppler was utilized to evaluate flow at rest and with distal augmentation maneuvers. COMPARISON:  None Available. FINDINGS: Contralateral Subclavian Vein: Respiratory phasicity is normal and  symmetric with the symptomatic side. No evidence of thrombus. Normal compressibility. Internal Jugular Vein: No evidence of thrombus. Normal compressibility, respiratory phasicity and response to augmentation. Subclavian Vein: No evidence of thrombus. Normal compressibility, respiratory phasicity and response to augmentation. Axillary Vein: No evidence of thrombus. Normal compressibility, respiratory phasicity and response to augmentation. Cephalic Vein: No evidence of thrombus. Normal compressibility, respiratory phasicity and response to augmentation. Basilic Vein: No evidence of thrombus. Normal compressibility, respiratory phasicity and response to augmentation. Brachial Veins: No evidence of thrombus. Normal compressibility, respiratory phasicity and response to augmentation. Radial Veins: No evidence of thrombus. Normal compressibility, respiratory phasicity and response to augmentation. Ulnar Veins: No evidence of thrombus. Normal compressibility, respiratory phasicity and response to augmentation. Venous Reflux:  None visualized. Other Findings: There is a hypoechoic circumscribed mass versus fluid collection in the left lateral upper arm, at the site of  patient's tenderness and area of palpable concern. The abnormality measures 0.4 x 0.3 x 0.2 cm. Increased echogenicity is seen within the subcutaneous tissues surrounding this structure. IMPRESSION: No evidence of DVT within the left upper extremity. At the site of palpable concern and tenderness in the left lateral upper arm, there is a small hypoechoic fluid collection versus mass surrounded by area of hyperechoic subcutaneous tissue. This may represent bruising/fat necrosis or small subcutaneous abscess. Please correlate clinically. Electronically Signed   By: Ted Mcalpine M.D.   On: 04/06/2023 11:27   CT CHEST ABDOMEN PELVIS W CONTRAST  Result Date: 03/30/2023 CLINICAL DATA:  Ovarian cancer with hysterectomy and omentectomy in 2021.  Chemotherapy stopped secondary to blood clots. Elevated tumor markers. Smoker. * Tracking Code: BO * EXAM: CT CHEST, ABDOMEN, AND PELVIS WITH CONTRAST TECHNIQUE: Multidetector CT imaging of the chest, abdomen and pelvis was performed following the standard protocol during bolus administration of intravenous contrast. RADIATION DOSE REDUCTION: This exam was performed according to the departmental dose-optimization program which includes automated exposure control, adjustment of the mA and/or kV according to patient size and/or use of iterative reconstruction technique. CONTRAST:  OMNIPAQUE IOHEXOL 300 MG/ML  SOLN COMPARISON:  12/13/2022 FINDINGS: CT CHEST FINDINGS Cardiovascular: Right Port-A-Cath tip right Port-A-Cath tip low right atrium. Normal aortic caliber. Borderline cardiomegaly. No central pulmonary embolism, on this non-dedicated study. Mediastinum/Nodes: No supraclavicular adenopathy. No mediastinal or hilar adenopathy. Lungs/Pleura: Trace right pleural fluid is similar to minimally increased on 49/2. Mild centrilobular emphysema. Minimal dependent right lower lobe subsegmental atelectasis. Anterior left lower lobe scarring. Musculoskeletal: No acute osseous abnormality. CT ABDOMEN PELVIS FINDINGS Hepatobiliary: Normal liver. Normal gallbladder, without biliary ductal dilatation. Pancreas: Normal, without mass or ductal dilatation. Spleen: Normal in size, without focal abnormality. Adrenals/Urinary Tract: Normal adrenal glands. Normal kidneys, without hydronephrosis. Normal urinary bladder. Stomach/Bowel: Normal stomach, without wall thickening. Ventral abdominal wall laxity again contains nonobstructive large and small bowel. Colonic stool burden suggests constipation. Normal terminal ileum. Otherwise normal small bowel. Vascular/Lymphatic: Aortic atherosclerosis. No abdominopelvic adenopathy. Reproductive: Hysterectomy.  No adnexal mass. Other: No significant free fluid. Most apparent on coronal  reformatted images is thickening and mild irregularity of the peritoneal reflexion. Example image 78 of series 4 today versus image 83 coronal on 08/30/2022. Musculoskeletal: Mild L4-5 and L5-S1 disc bulges. IMPRESSION: 1. Developing thickening and irregularity of the peritoneal reflection especially compared back to 08/30/2022, suspicious for early/subtle peritoneal metastasis. This could be re-evaluated on three-month follow-up CT or more entirely characterized with PET. 2. Otherwise, no evidence of metastatic disease. 3. Trace right pleural fluid, similar to slightly increased. 4. Similar large and small bowel containing ventral abdominal wall laxity. 5. Aortic atherosclerosis (ICD10-I70.0) and emphysema (ICD10-J43.9). 6. Port-A-Cath tip at low right atrium. 7.  Possible constipation. Electronically Signed   By: Jeronimo Greaves M.D.   On: 03/30/2023 16:49    PERFORMANCE STATUS (ECOG) : 1 - Symptomatic but completely ambulatory  Review of Systems Unless otherwise noted, a complete review of systems is negative.  Physical Exam General: NAD Cardiovascular: regular rate and rhythm Pulmonary: clear ant fields Abdomen: soft, nontender, + bowel sounds GU: no suprapubic tenderness Extremities: no edema, no joint deformities Skin: no rashes Neurological: Weakness but otherwise nonfocal  IMPRESSION/PLAN: High-grade serous adenocarcinoma  - on Doxil carboplatin.  Nausea-she has had intermittent nausea since receiving last chemotherapy.  Recommend maximizing antiemetics.  Exam is benign and low suspicion for more serious etiology such as obstruction.  However, ED triggers reviewed.  Constipation -  patient is not currently on a regular bowel regimen as she moved and misplaced her senna/MiraLAX.  She was given MiraLAX samples today and we discussed bowel regimen in detail.  Night sweats -likely due to cancer progression.  Patient is already on regimen of duloxetine, Lyrica, and clonidine.  I do not want to  liberalize her clonidine given concerns for possible hypotension.  Discussed with Dr. Donneta [name] who advised conservative measures as he suspects his symptoms may improve with chemotherapy.  Discussed conservative measures such as weight loss, fans, cotton sheets, etc.  Case and plan discussed with Dr. Donneta [name]  Patient expressed understanding and was in agreement with this plan. She also understands that She can call clinic at any time with any questions, concerns, or complaints.   Thank you for allowing me to participate in the care of this very pleasant patient.   Time Total: 20 minutes  Visit consisted of counseling and education dealing with the complex and emotionally intense issues of symptom management in the setting of serious illness.Greater than 50%  of this time was spent counseling and coordinating care related to the above assessment and plan.  Signed by: Laurette Schimke, PhD, NP-C

## 2023-05-03 ENCOUNTER — Ambulatory Visit: Payer: Medicare Other | Admitting: Family Medicine

## 2023-05-05 ENCOUNTER — Encounter: Payer: Self-pay | Admitting: Internal Medicine

## 2023-05-05 ENCOUNTER — Inpatient Hospital Stay (HOSPITAL_BASED_OUTPATIENT_CLINIC_OR_DEPARTMENT_OTHER): Payer: Medicare Other | Admitting: Hospice and Palliative Medicine

## 2023-05-05 ENCOUNTER — Ambulatory Visit
Admission: RE | Admit: 2023-05-05 | Discharge: 2023-05-05 | Disposition: A | Discharge status: Home / Self Care | Payer: Medicare Other | Source: Ambulatory Visit | Attending: Hospice and Palliative Medicine | Admitting: Hospice and Palliative Medicine

## 2023-05-05 ENCOUNTER — Encounter: Payer: Self-pay | Admitting: *Deleted

## 2023-05-05 ENCOUNTER — Telehealth: Payer: Self-pay | Admitting: *Deleted

## 2023-05-05 ENCOUNTER — Ambulatory Visit: Payer: Medicare Other

## 2023-05-05 DIAGNOSIS — C482 Malignant neoplasm of peritoneum, unspecified: Secondary | ICD-10-CM

## 2023-05-05 DIAGNOSIS — R519 Headache, unspecified: Secondary | ICD-10-CM

## 2023-05-05 DIAGNOSIS — R42 Dizziness and giddiness: Secondary | ICD-10-CM

## 2023-05-05 DIAGNOSIS — G08 Intracranial and intraspinal phlebitis and thrombophlebitis: Secondary | ICD-10-CM | POA: Insufficient documentation

## 2023-05-05 MED ORDER — GADOBUTROL 1 MMOL/ML IV SOLN
9.0000 mL | Freq: Once | INTRAVENOUS | Status: AC | PRN
  Administered 2023-05-05: 9 mL via INTRAVENOUS

## 2023-05-05 MED ORDER — HEPARIN SOD (PORK) LOCK FLUSH 100 UNIT/ML IV SOLN
INTRAVENOUS | Status: AC
  Filled 2023-05-05: qty 5

## 2023-05-05 NOTE — Telephone Encounter (Signed)
Mychart visit added to see Josh. NP will order stat mri brain w/w/o contrast given pt's h/o nonocclusive dural venous sinus thrombus and above symptoms.

## 2023-05-05 NOTE — Progress Notes (Signed)
Virtual Visit via Video Note  I connected with [name] on 05/05/23 at  9:30 AM EDT by a video enabled telemedicine application and verified that I am speaking with the correct person using two identifiers.  Location: Patient: Home Provider: Clinic   I discussed the limitations of evaluation and management by telemedicine and the availability of in person appointments. The patient expressed understanding and agreed to proceed.  History of Present Illness: [name] is a 52 y.o. 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  Patient is also status post neoadjuvant carbo/Taxol on maintenance Avastin/Lynparza.   Patient last saw Dr. Donneta Romberg on 04/21/2023.  She has been off maintenance olaparib-Avastin for past 3 months due to blood clot and progressive myalgias.  Patient is now on Xarelto and prednisone.    Observations/Objective: Patient requested virtual visit today to discuss recurrent dizziness, left-sided headaches, and right-sided tinnitus.  She reports that the symptoms are similar to when she was diagnosed with dural venous thrombus for which she was hospitalized in January 2024.  She denies any visual changes, confusion, weakness, or other symptomatic complaints.  Patient confirms that she is still taking Xarelto 20 mg daily.  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  Additionally, she remains anticoagulated on Xarelto.  She has chronic/recurrent dizziness and headaches but she feels like symptoms are worse today with right-sided tinnitus.  Discussed with Dr. Donneta Romberg and will obtain stat MRI of the brain.   Discussed ED triggers with patient at length.  Follow Up Instructions: TBD   I discussed the assessment and treatment plan with the patient. The patient was provided an opportunity to ask questions and all were answered. The patient agreed with the plan and demonstrated an understanding of the instructions.   The patient was advised to call back or seek an in-person evaluation if the symptoms worsen or if the condition fails to improve as anticipated.  I provided 15 minutes of non-face-to-face time during this encounter.   Malachy Moan, NP

## 2023-05-05 NOTE — Telephone Encounter (Signed)
Patient continues to have wobbly legs, Dizziness and ringing in her ears for almost a week. It's getting worse. Legs are getting weaker. Patient reports headaches on left side of her head. Pt is requesting a virtual visit with [name] today to discuss these concerns

## 2023-05-07 ENCOUNTER — Other Ambulatory Visit: Payer: Medicare Other

## 2023-05-07 ENCOUNTER — Ambulatory Visit: Payer: Medicare Other
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
## 2023-05-10 ENCOUNTER — Ambulatory Visit: Payer: Medicare Other | Admitting: Family Medicine

## 2023-05-10 ENCOUNTER — Telehealth: Payer: Self-pay | Admitting: *Deleted

## 2023-05-10 DIAGNOSIS — R52 Pain, unspecified: Secondary | ICD-10-CM

## 2023-05-10 DIAGNOSIS — R519 Headache, unspecified: Secondary | ICD-10-CM

## 2023-05-10 DIAGNOSIS — G08 Intracranial and intraspinal phlebitis and thrombophlebitis: Secondary | ICD-10-CM

## 2023-05-10 NOTE — Telephone Encounter (Signed)
Patient just called reporting that for the past 3 hours her right upper gums have been bleeding and spitting out clots non stop and she does not know what to do. I told her that at this time of day it is too late to come to office, but to try to do baking soda rinses with cool water and if it continues to go to UC or ER for evaluation as her platelet count has been slowly dropping over past several checks and she recently restarted chemotherapy on 5/3. She is in agreement with this plan

## 2023-05-11 ENCOUNTER — Telehealth: Payer: Self-pay | Admitting: *Deleted

## 2023-05-11 ENCOUNTER — Encounter: Payer: Self-pay | Admitting: Internal Medicine

## 2023-05-11 ENCOUNTER — Other Ambulatory Visit: Payer: Self-pay | Admitting: *Deleted

## 2023-05-11 DIAGNOSIS — C482 Malignant neoplasm of peritoneum, unspecified: Secondary | ICD-10-CM

## 2023-05-11 DIAGNOSIS — K068 Other specified disorders of gingiva and edentulous alveolar ridge: Secondary | ICD-10-CM

## 2023-05-11 DIAGNOSIS — D696 Thrombocytopenia, unspecified: Secondary | ICD-10-CM

## 2023-05-11 NOTE — Telephone Encounter (Signed)
Spoke with patient. 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 She would still have to come from [place] after her volunteer service. I offered to make her a lab only this afternoon. Pt would like to wait to see smc until tomorrow. Apts given at 11 am for lab tomorrow and 1115 with Sharia Reeve, NP- smc

## 2023-05-11 NOTE — Telephone Encounter (Signed)
I called patient to check on her and see if she is still haivng problems with bleeding gums. Sh ereports that it stopped 30 mins after speaking with her  last evening, but has started back this morning after getting up. She states that she is again haiving blood clots and bleeding from gums. It slows down then starts again. 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 Please advise

## 2023-05-11 NOTE — Telephone Encounter (Signed)
Pt called- see separate phone encounter

## 2023-05-12 ENCOUNTER — Inpatient Hospital Stay: Payer: Medicare Other | Admitting: Hospice and Palliative Medicine

## 2023-05-12 ENCOUNTER — Inpatient Hospital Stay: Payer: Medicare Other

## 2023-05-12 NOTE — Telephone Encounter (Signed)
Pt called back and stated her gums have not bled in over 24 hours so she thinks she is fine and requested to cancel appt. Appts have been cancelled and pt will call back if she needs an appt /.

## 2023-05-15 ENCOUNTER — Other Ambulatory Visit: Payer: Self-pay | Admitting: *Deleted

## 2023-05-15 ENCOUNTER — Other Ambulatory Visit: Payer: Self-pay | Admitting: Hospice and Palliative Medicine

## 2023-05-15 ENCOUNTER — Other Ambulatory Visit: Payer: Self-pay | Admitting: Family Medicine

## 2023-05-15 ENCOUNTER — Encounter: Payer: Self-pay | Admitting: Hospice and Palliative Medicine

## 2023-05-15 ENCOUNTER — Encounter: Payer: Self-pay | Admitting: Internal Medicine

## 2023-05-15 DIAGNOSIS — K219 Gastro-esophageal reflux disease without esophagitis: Secondary | ICD-10-CM

## 2023-05-15 DIAGNOSIS — G43109 Migraine with aura, not intractable, without status migrainosus: Secondary | ICD-10-CM

## 2023-05-15 DIAGNOSIS — M5136 Other intervertebral disc degeneration, lumbar region: Secondary | ICD-10-CM

## 2023-05-15 DIAGNOSIS — B001 Herpesviral vesicular dermatitis: Secondary | ICD-10-CM

## 2023-05-15 DIAGNOSIS — F331 Major depressive disorder, recurrent, moderate: Secondary | ICD-10-CM

## 2023-05-15 DIAGNOSIS — I700 Atherosclerosis of aorta: Secondary | ICD-10-CM

## 2023-05-15 MED ORDER — OXYCODONE HCL 5 MG PO TABS: 5.0000 mg | ORAL_TABLET | Freq: Four times a day (QID) | ORAL | 0 refills | Status: DC | PRN

## 2023-05-15 NOTE — Telephone Encounter (Signed)
Medication Refill - Medication: omeprazole (PRILOSEC) 20 MG capsule , buPROPion (WELLBUTRIN XL) 300 MG 24 hr tablet , DULoxetine (CYMBALTA) 60 MG capsule , rosuvastatin (CRESTOR) 5 MG tablet , cariprazine (VRAYLAR) 1.5 MG capsule   Pt is requesting a 73-month supply.   Has the patient contacted their pharmacy? No. (Agent: If no, request that the patient contact the pharmacy for the refill. If patient does not wish to contact the pharmacy document the reason why and proceed with request.)   Preferred Pharmacy (with phone number or street name):  Has the patient been seen for an appointment in the last year OR [facility] - [place], [place] - [phone]  [address] [place]  [address]  Phone: [phone] Fax: [phone]  Hours: Not open 24 hours    does the patient have an upcoming appointment? Yes.    Agent: Please be advised that RX refills may take up to 3 business days. We ask that you follow-up with your pharmacy.

## 2023-05-15 NOTE — Telephone Encounter (Signed)
Medication Refill - Medication: SUMAtriptan (IMITREX) 100 MG tablet , valACYclovir (VALTREX) 1000 MG tablet    Has the patient contacted their pharmacy? No.   Preferred Pharmacy (with phone number or street name):  [facility] - [place], [place] - [address] Phone: [phone]  Fax: [phone]      Has the patient been seen for an appointment in the last year OR does the patient have an upcoming appointment? Yes.      The patient called back stating there were two medicines she forgot to ask for refills on as well to be [facility] for mail delivery. Please assist patient further

## 2023-05-16 ENCOUNTER — Other Ambulatory Visit: Payer: Self-pay | Admitting: *Deleted

## 2023-05-16 DIAGNOSIS — C482 Malignant neoplasm of peritoneum, unspecified: Secondary | ICD-10-CM

## 2023-05-16 MED ORDER — CARIPRAZINE HCL 1.5 MG PO CAPS
1.5000 mg | ORAL_CAPSULE | Freq: Every day | ORAL | 0 refills | Status: DC
Start: 2023-05-16 — End: 2023-08-01

## 2023-05-16 MED ORDER — SUMATRIPTAN SUCCINATE 100 MG PO TABS
100.0000 mg | ORAL_TABLET | ORAL | 2 refills | Status: DC | PRN
Start: 2023-05-16 — End: 2023-08-09

## 2023-05-16 MED ORDER — CLONIDINE 0.3 MG/24HR TD PTWK: 0.3000 mg | MEDICATED_PATCH | TRANSDERMAL | 3 refills | Status: DC

## 2023-05-16 MED ORDER — OMEPRAZOLE 20 MG PO CPDR
DELAYED_RELEASE_CAPSULE | ORAL | 0 refills | Status: DC
Start: 2023-05-16 — End: 2023-08-01

## 2023-05-16 MED ORDER — DULOXETINE HCL 60 MG PO CPEP
60.0000 mg | ORAL_CAPSULE | Freq: Every day | ORAL | 0 refills | Status: DC
Start: 2023-05-16 — End: 2023-08-01

## 2023-05-16 MED ORDER — BUPROPION HCL ER (XL) 300 MG PO TB24
300.0000 mg | ORAL_TABLET | Freq: Every day | ORAL | 0 refills | Status: DC
Start: 2023-05-16 — End: 2023-08-01

## 2023-05-16 MED ORDER — ONDANSETRON 8 MG PO TBDP: 8.0000 mg | ORAL_TABLET | Freq: Three times a day (TID) | ORAL | 3 refills | Status: DC | PRN

## 2023-05-16 MED ORDER — TRAZODONE HCL 150 MG PO TABS: 150.0000 mg | ORAL_TABLET | Freq: Every evening | ORAL | 1 refills | Status: DC | PRN

## 2023-05-16 MED ORDER — ROSUVASTATIN CALCIUM 5 MG PO TABS
ORAL_TABLET | ORAL | 0 refills | Status: DC
Start: 2023-05-16 — End: 2023-08-01

## 2023-05-16 MED ORDER — RIVAROXABAN 20 MG PO TABS: 20.0000 mg | ORAL_TABLET | Freq: Every day | ORAL | 1 refills | Status: DC

## 2023-05-16 MED ORDER — PROCHLORPERAZINE MALEATE 10 MG PO TABS
10.0000 mg | ORAL_TABLET | Freq: Four times a day (QID) | ORAL | 3 refills | Status: DC | PRN
Start: 2023-05-16 — End: 2023-07-31

## 2023-05-16 MED ORDER — VALACYCLOVIR HCL 1 G PO TABS
1000.0000 mg | ORAL_TABLET | Freq: Two times a day (BID) | ORAL | 2 refills | Status: AC | PRN
Start: 2023-05-16 — End: ?

## 2023-05-16 MED ORDER — CLONIDINE HCL 0.1 MG PO TABS: 0.1000 mg | ORAL_TABLET | Freq: Every day | ORAL | 1 refills | Status: DC

## 2023-05-16 NOTE — Telephone Encounter (Signed)
Requested Prescriptions  Pending Prescriptions Disp Refills   SUMAtriptan (IMITREX) 100 MG tablet 10 tablet 2    Sig: Take 1 tablet (100 mg total) by mouth every 2 (two) hours as needed for migraine. May repeat in 2 hours if headache persists or recurs.     Neurology:  Migraine Therapy - Triptan Passed - 05/15/2023 12:47 PM      Passed - Last BP in normal range    BP Readings from Last 1 Encounters:  04/28/23 115/77         Passed - Valid encounter within last 12 months    Recent Outpatient Visits           3 months ago Hospital discharge follow-up   [facility] Brianna Cory, MD   4 months ago Moderate episode of recurrent major depressive disorder [facility])   [place] [facility] [place], Danna Hefty, MD   7 months ago Moderate episode of recurrent major depressive disorder [facility])   [place] [facility] [place], Danna Hefty, MD   9 months ago Moderate episode of recurrent major depressive disorder [facility])   [facility] Danelle Berry, PA-C   11 months ago Moderate episode of recurrent major depressive disorder [facility])   [place] [facility] Danelle Berry, PA-C       Future Appointments             In 1 month Brianna Cory, MD [facility]             valACYclovir (VALTREX) 1000 MG tablet 6 tablet 2    Sig: Take 1 tablet (1,000 mg total) by mouth 2 (two) times daily as needed.     Antimicrobials:  Antiviral Agents - Anti-Herpetic Passed - 05/15/2023 12:47 PM      Passed - Valid encounter within last 12 months    Recent Outpatient Visits           3 months ago Hospital discharge follow-up   [facility] [place], Danna Hefty, MD   4 months ago Moderate episode of recurrent major depressive disorder [facility])   [place] [facility] [place], Danna Hefty, MD   7 months ago Moderate episode of recurrent major depressive  disorder [facility])   [place] [facility] [place], Danna Hefty, MD   9 months ago Moderate episode of recurrent major depressive disorder [facility])   [place] Danelle Berry, PA-C   11 months ago Moderate episode of recurrent major depressive disorder [facility])   [place] [facility] Danelle Berry, PA-C       Future Appointments             In 1 month Brianna Cory, MD [facility]             omeprazole (PRILOSEC) 20 MG capsule 90 capsule 0    Sig: TAKE 1 CAPSULE(20 MG) BY MOUTH DAILY     Gastroenterology: Proton Pump Inhibitors Passed - 05/15/2023 12:47 PM      Passed - Valid encounter within last 12 months    Recent Outpatient Visits           3 months ago Hospital discharge follow-up   [facility], MD   4 months ago Moderate episode of recurrent major depressive disorder [facility])    [facility] Bri[facility] Cory, MD   7 months ago Moderate episode  of recurrent major depressive disorder [facility])   [place] [facility] [place], Danna Hefty, MD   9 months ago Moderate episode of recurrent major depressive disorder [facility])   [place] [facility] Danelle Berry, PA-C   11 months ago Moderate episode of recurrent major depressive disorder [facility])   [place] [facility] Danelle Berry, PA-C       Future Appointments             In 1 month Brianna Cory, MD [facility]             rosuvastatin (CRESTOR) 5 MG tablet 90 tablet 0    Sig: TAKE 1 TABLET BY MOUTH ONCE DAILY. IN PLACE OF ATORVASTATIN     Cardiovascular:  Antilipid - Statins 2 Failed - 05/15/2023 12:47 PM      Failed - Lipid Panel in normal range within the last 12 months    Cholesterol, Total  Date Value Ref Range Status  05/17/2022 249 (H) 100 - 199 mg/dL Final   LDL Cholesterol (Calc)   Date Value Ref Range Status  08/31/2021 156 (H) mg/dL (calc) Final    Comment:    Reference range: <100 . Desirable range <100 mg/dL for primary prevention;   <70 mg/dL for patients with CHD or diabetic patients  with > or = 2 CHD risk factors. Marland Kitchen LDL-C is now calculated using the Martin-Hopkins  calculation, which is a validated novel method providing  better accuracy than the Friedewald equation in the  estimation of LDL-C.  Horald Pollen et al. Lenox Ahr. 1610;960(45): 2061-2068  (http://education.[employer].com/faq/FAQ164)    LDL Chol Calc (NIH)  Date Value Ref Range Status  05/17/2022 153 (H) 0 - 99 mg/dL Final   HDL  Date Value Ref Range Status  05/17/2022 58 >39 mg/dL Final   Triglycerides  Date Value Ref Range Status  05/17/2022 211 (H) 0 - 149 mg/dL Final         Passed - Cr in normal range and within 360 days    Creatinine  Date Value Ref Range Status  04/28/2023 0.90 0.44 - 1.00 mg/dL Final   Creat  Date Value Ref Range Status  10/25/2019 0.79 0.50 - 1.10 mg/dL Final   Creatinine, Urine  Date Value Ref Range Status  08/09/2022 310 mg/dL Final         Passed - Patient is not pregnant      Passed - Valid encounter within last 12 months    Recent Outpatient Visits           3 months ago Hospital discharge follow-up   [facility] Bri[facility] Cory, MD   4 months ago Moderate episode of recurrent major depressive disorder [facility])   [place] [facility] [place], Danna Hefty, MD   7 months ago Moderate episode of recurrent major depressive disorder [facility])   [place] [facility] [place], Danna Hefty, MD   9 months ago Moderate episode of recurrent major depressive disorder [facility])   [place] [facility] Danelle Berry, PA-C   11 months ago Moderate episode of recurrent major depressive disorder [facility])   [facility] Danelle Berry, PA-C       Future Appointments              In 1 month Carlynn Purl, Danna Hefty, MD [facility]             buPROPion (WELLBUTRIN XL)  300 MG 24 hr tablet 90 tablet 0    Sig: Take 1 tablet (300 mg total) by mouth daily.     Psychiatry: Antidepressants - bupropion Passed - 05/15/2023 12:47 PM      Passed - Cr in normal range and within 360 days    Creatinine  Date Value Ref Range Status  04/28/2023 0.90 0.44 - 1.00 mg/dL Final   Creat  Date Value Ref Range Status  10/25/2019 0.79 0.50 - 1.10 mg/dL Final   Creatinine, Urine  Date Value Ref Range Status  08/09/2022 310 mg/dL Final         Passed - AST in normal range and within 360 days    AST  Date Value Ref Range Status  04/28/2023 26 15 - 41 U/L Final         Passed - ALT in normal range and within 360 days    ALT  Date Value Ref Range Status  04/28/2023 16 0 - 44 U/L Final         Passed - Completed PHQ-2 or PHQ-9 in the last 360 days      Passed - Last BP in normal range    BP Readings from Last 1 Encounters:  04/28/23 115/77         Passed - Valid encounter within last 6 months    Recent Outpatient Visits           3 months ago Hospital discharge follow-up   [facility] Bri[facility], MD   4 months ago Moderate episode of recurrent major depressive disorder [facility])   [place] [facility] Brianna Cory, MD   7 months ago Moderate episode of recurrent major depressive disorder [facility])   [place] [facility] [place], Danna Hefty, MD   9 months ago Moderate episode of recurrent major depressive disorder [facility])   [place] [facility] Danelle Berry, PA-C   11 months ago Moderate episode of recurrent major depressive disorder [facility])   [place] [facility] Danelle Berry, PA-C       Future Appointments             In 1 month Brianna Cory, MD [facility]             cariprazine (VRAYLAR) 1.5 MG  capsule 90 capsule 0    Sig: Take 1 capsule (1.5 mg total) by mouth daily.     Off-Protocol Failed - 05/15/2023 12:47 PM      Failed - Medication not assigned to a protocol, review manually.      Passed - Valid encounter within last 12 months    Recent Outpatient Visits           3 months ago Hospital discharge follow-up   [facility] [place], Danna Hefty, MD   4 months ago Moderate episode of recurrent major depressive disorder [facility])   [place] [facility] Brianna Cory, MD   7 months ago Moderate episode of recurrent major depressive disorder [facility])   [place] Brianna Cory, MD   9 months ago Moderate episode of recurrent major depressive disorder [facility])   [place] [facility] Danelle Berry, PA-C   11 months ago Moderate episode of recurrent major depressive disorder [facility])   [place] [facility] Danelle Berry, [place]       Future Appointments  In 1 month [place], Danna Hefty, MD [facility]             DULoxetine (CYMBALTA) 60 MG capsule 90 capsule 0    Sig: Take 1 capsule (60 mg total) by mouth daily.     Psychiatry: Antidepressants - SNRI - duloxetine Passed - 05/15/2023 12:47 PM      Passed - Cr in normal range and within 360 days    Creatinine  Date Value Ref Range Status  04/28/2023 0.90 0.44 - 1.00 mg/dL Final   Creat  Date Value Ref Range Status  10/25/2019 0.79 0.50 - 1.10 mg/dL Final   Creatinine, Urine  Date Value Ref Range Status  08/09/2022 310 mg/dL Final         Passed - eGFR is 30 or above and within 360 days    GFR, Est African American  Date Value Ref Range Status  10/25/2019 103 > OR = 60 mL/min/1.17m2 Final   GFR calc Af Amer  Date Value Ref Range Status  09/08/2020 >60 >60 mL/min Final   GFR, Est Non African American  Date Value Ref Range Status  10/25/2019 89 > OR = 60 mL/min/1.23m2 Final    GFR, Estimated  Date Value Ref Range Status  04/28/2023 >60 >60 mL/min Final    Comment:    (NOTE) Calculated using the CKD-EPI Creatinine Equation (2021)    GFR  Date Value Ref Range Status  12/23/2016 87.54 >60.00 mL/min Final         Passed - Completed PHQ-2 or PHQ-9 in the last 360 days      Passed - Last BP in normal range    BP Readings from Last 1 Encounters:  04/28/23 115/77         Passed - Valid encounter within last 6 months    Recent Outpatient Visits           3 months ago Hospital discharge follow-up   [facility] Bri[facility] Cory, MD   4 months ago Moderate episode of recurrent major depressive disorder [facility])   [place] [facility] Bri[facility] Cory, MD   7 months ago Moderate episode of recurrent major depressive disorder [facility])   [place] [facility] Brianna Cory, MD   9 months ago Moderate episode of recurrent major depressive disorder [facility])   [place] [facility] Danelle Berry, PA-C   11 months ago Moderate episode of recurrent major depressive disorder [facility])   [place] [facility] Danelle Berry, PA-C       Future Appointments             In 1 month Brianna Cory, MD [facility]

## 2023-05-16 NOTE — Telephone Encounter (Signed)
Pt requesting RFs [facility] order [facility].- pt requested a 90 days supply.

## 2023-05-17 ENCOUNTER — Ambulatory Visit: Payer: Medicare Other | Admitting: Family Medicine

## 2023-05-18 ENCOUNTER — Encounter: Payer: Self-pay | Admitting: Internal Medicine

## 2023-05-18 ENCOUNTER — Encounter: Payer: Self-pay | Admitting: Family Medicine

## 2023-05-18 MED FILL — Fosaprepitant Dimeglumine For IV Infusion 150 MG (Base Eq): INTRAVENOUS | Qty: 5 | Status: AC

## 2023-05-18 MED FILL — Dexamethasone Sodium Phosphate Inj 100 MG/10ML: INTRAMUSCULAR | Qty: 1 | Status: AC

## 2023-05-19 ENCOUNTER — Encounter: Payer: Self-pay | Admitting: Internal Medicine

## 2023-05-19 ENCOUNTER — Other Ambulatory Visit: Payer: Self-pay

## 2023-05-19 ENCOUNTER — Inpatient Hospital Stay: Payer: Medicare Other

## 2023-05-19 ENCOUNTER — Other Ambulatory Visit: Payer: Self-pay | Admitting: *Deleted

## 2023-05-19 ENCOUNTER — Inpatient Hospital Stay (HOSPITAL_BASED_OUTPATIENT_CLINIC_OR_DEPARTMENT_OTHER): Payer: Medicare Other | Admitting: Internal Medicine

## 2023-05-19 VITALS — BP 115/84 | HR 81 | Temp 96.4°F | Ht 63.0 in | Wt 214.6 lb

## 2023-05-19 VITALS — BP 117/81 | HR 68 | Temp 97.2°F | Resp 18

## 2023-05-19 DIAGNOSIS — C482 Malignant neoplasm of peritoneum, unspecified: Secondary | ICD-10-CM

## 2023-05-19 DIAGNOSIS — Z7189 Other specified counseling: Secondary | ICD-10-CM

## 2023-05-19 DIAGNOSIS — R110 Nausea: Secondary | ICD-10-CM | POA: Diagnosis not present

## 2023-05-19 DIAGNOSIS — C801 Malignant (primary) neoplasm, unspecified: Secondary | ICD-10-CM

## 2023-05-19 DIAGNOSIS — E876 Hypokalemia: Secondary | ICD-10-CM

## 2023-05-19 DIAGNOSIS — N951 Menopausal and female climacteric states: Secondary | ICD-10-CM | POA: Diagnosis not present

## 2023-05-19 DIAGNOSIS — Z79899 Other long term (current) drug therapy: Secondary | ICD-10-CM | POA: Diagnosis not present

## 2023-05-19 DIAGNOSIS — K5900 Constipation, unspecified: Secondary | ICD-10-CM | POA: Diagnosis not present

## 2023-05-19 LAB — CMP (CANCER CENTER ONLY)
ALT: 10 U/L (ref 0–44)
AST: 18 U/L (ref 15–41)
Albumin: 2.8 g/dL — ABNORMAL LOW (ref 3.5–5.0)
Alkaline Phosphatase: 48 U/L (ref 38–126)
Anion gap: 7 (ref 5–15)
BUN: 8 mg/dL (ref 6–20)
CO2: 21 mmol/L — ABNORMAL LOW (ref 22–32)
Calcium: 7 mg/dL — ABNORMAL LOW (ref 8.9–10.3)
Chloride: 112 mmol/L — ABNORMAL HIGH (ref 98–111)
Creatinine: 0.77 mg/dL (ref 0.44–1.00)
GFR, Estimated: >60 mL/min (ref >60)
Glucose, Bld: 121 mg/dL — ABNORMAL HIGH (ref 70–99)
Potassium: 2.6 mmol/L — CL (ref 3.5–5.1)
Sodium: 140 mmol/L (ref 135–145)
Total Bilirubin: 0.3 mg/dL (ref 0.3–1.2)
Total Protein: 5 g/dL — ABNORMAL LOW (ref 6.5–8.1)

## 2023-05-19 LAB — CBC WITH DIFFERENTIAL (CANCER CENTER ONLY)
Abs Immature Granulocytes: 0.02 10*3/uL (ref 0.00–0.07)
Basophils Absolute: 0 10*3/uL (ref 0.0–0.1)
Basophils Relative: 0 %
Eosinophils Absolute: 0 10*3/uL (ref 0.0–0.5)
Eosinophils Relative: 1 %
HCT: 35.7 % — ABNORMAL LOW (ref 36.0–46.0)
Hemoglobin: 11.8 g/dL — ABNORMAL LOW (ref 12.0–15.0)
Immature Granulocytes: 1 %
Lymphocytes Relative: 53 %
Lymphs Abs: 1.4 10*3/uL (ref 0.7–4.0)
MCH: 33.1 pg (ref 26.0–34.0)
MCHC: 33.1 g/dL (ref 30.0–36.0)
MCV: 100.3 fL — ABNORMAL HIGH (ref 80.0–100.0)
Monocytes Absolute: 0.4 10*3/uL (ref 0.1–1.0)
Monocytes Relative: 16 %
Neutro Abs: 0.7 10*3/uL — ABNORMAL LOW (ref 1.7–7.7)
Neutrophils Relative %: 29 %
Platelet Count: 157 10*3/uL (ref 150–400)
RBC: 3.56 MIL/uL — ABNORMAL LOW (ref 3.87–5.11)
RDW: 15.2 % (ref 11.5–15.5)
WBC Count: 2.6 10*3/uL — ABNORMAL LOW (ref 4.0–10.5)
nRBC: 0 % (ref 0.0–0.2)

## 2023-05-19 MED ORDER — POTASSIUM CHLORIDE CRYS ER 20 MEQ PO TBCR: EXTENDED_RELEASE_TABLET | ORAL | 3 refills | Status: DC

## 2023-05-19 MED ORDER — OXYCODONE HCL 5 MG PO TABS
5.0000 mg | ORAL_TABLET | Freq: Four times a day (QID) | ORAL | 0 refills | Status: DC | PRN
  Filled 2023-05-19: qty 75, 19d supply, fill #0

## 2023-05-19 MED ORDER — POTASSIUM CHLORIDE 20 MEQ/100ML IV SOLN
20.0000 meq | Freq: Once | INTRAVENOUS | Status: AC
  Administered 2023-05-19: 20 meq via INTRAVENOUS

## 2023-05-19 MED ORDER — HEPARIN SOD (PORK) LOCK FLUSH 100 UNIT/ML IV SOLN
500.0000 [IU] | Freq: Once | INTRAVENOUS | Status: AC
  Administered 2023-05-19: 500 [IU] via INTRAVENOUS
  Filled 2023-05-19: qty 5

## 2023-05-19 MED ORDER — RIVAROXABAN 20 MG PO TABS: 20.0000 mg | ORAL_TABLET | Freq: Every day | ORAL | 0 refills | Status: DC

## 2023-05-19 MED ORDER — POTASSIUM CHLORIDE CRYS ER 20 MEQ PO TBCR
20.0000 meq | EXTENDED_RELEASE_TABLET | Freq: Two times a day (BID) | ORAL | 3 refills | Status: DC
  Filled 2023-05-19: qty 60, 30d supply, fill #0

## 2023-05-19 MED ORDER — RIVAROXABAN 20 MG PO TABS
20.0000 mg | ORAL_TABLET | Freq: Every day | ORAL | 1 refills | Status: DC
  Filled 2023-05-19: qty 30, 30d supply, fill #0

## 2023-05-19 MED ORDER — SODIUM CHLORIDE 0.9 % IV SOLN
Freq: Once | INTRAVENOUS | Status: AC
  Filled 2023-05-19: qty 250

## 2023-05-19 NOTE — Progress Notes (Signed)
Call from [facility] in [facility] lab with critical Potassium level of 2.6. Read back. Dr Donneta Romberg notified.

## 2023-05-19 NOTE — Progress Notes (Signed)
KCl 20 mEq x 2 over one hour per bag. Patient has port per medical team   Demetrius Charity, PharmD

## 2023-05-19 NOTE — Assessment & Plan Note (Addendum)
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 APRIL 4th 2024-recurrent peritoneal carcinomatosis.  No significant ascites.  No evidence of any bowel obstruction.  Last carboplatin-taxol-April 2021. Currently on with Doxil-carboplatin every 4 weeks. APRIL 2024- 2D echo- 55-60%  # HOLD  cycle # 2 Doxil-carboplatin - CBC CMP are reviewed- ANC 0.7; HOLD  zarxio for now.  Plan to add Udenyca with cycle #2.  # Gum bleeding-grade 1 through 2 /on Xarelto -improved currently continue salt/backing soda rinses.   # Nausea/vomiting- G-1-2-continue Zofran and Compazine.  # constipation.  Wanted to on miralax prn.    # Hypocalcemia- check Vit D levels; Hypokalemia- severe -2.6 potassium.  Order K-Dur twice daily and also 40 of potassium today.  # Ongoing MSK joints-February 2024 -bone scan negative for malignancy.  On oxycodone /Dr. Borders stable e.    # AN 2024- Brain MRI with and without contrast-nonocclusive dural venous sinus thrombosis noted-  Currently on xarelto.  Stable.     # PN G-2/back pain [awaiting steroid injection]- on Lyrica 50 mg TID/Cymbalta- stable.   # Anxiety/Insomnia--social stressors-continue Cymbalta [at 20 mg/day]; continue trazadone 150 mg qhs- see above.   # Hot flashes: on Clonidine patch- on Friday weekly; and clonidine pill 0.1 qhs. stable.   #IV access/Mediport-currently s/p TPA- port flush.   # DISPOSITION:  # HOLD chemo today-  # KCL 40 meq today; and IVFs over 1 hour # follow up in 2  weeks- MD; labs- cbc/cmp; ca-125; Doxil- carbo; 6/10- udenyca -  Dr.B

## 2023-05-19 NOTE — Progress Notes (Signed)
No concerns today 

## 2023-05-19 NOTE — Progress Notes (Signed)
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 reviewed- ANC 0.7; HOLD  zarxio for now.  Plan to add Udenyca with cycle #2.  # Gum bleeding-grade 1 through 2 /on Xarelto -improved currently continue salt/backing soda rinses.   # Nausea/vomiting- G-1-2-continue Zofran and Compazine.  # constipation.  Wanted to on miralax prn.    # Hypocalcemia- check Vit D levels; Hypokalemia- severe -2.6 potassium.  Order K-Dur twice daily and also 40 of potassium today.  # Ongoing MSK joints-February 2024 -bone scan negative for malignancy.  On oxycodone /Dr. Borders stable e.    # AN 2024- Brain MRI with and without contrast-nonocclusive dural venous sinus thrombosis noted-  Currently on xarelto.  Stable.     # PN G-2/back pain [awaiting steroid injection]- on Lyrica 50 mg TID/Cymbalta- stable.   # Anxiety/Insomnia--social stressors-continue Cymbalta [at 20  mg/day]; continue trazadone 150 mg qhs- see above.   # Hot flashes: on Clonidine patch- on Friday weekly; and clonidine pill 0.1 qhs. stable.   #IV access/Mediport-currently s/p TPA- port flush.   # DISPOSITION:  # HOLD chemo today-  # KCL 40 meq today; and IVFs over 1 hour # follow up in 2  weeks- MD; labs- cbc/cmp; ca-125; Doxil- carbo; 6/10- udenyca -  Dr.B      Earna Coder, MD 05/19/2023 9:54 AM

## 2023-05-20 LAB — CA 125: Cancer Antigen (CA) 125: 74.9 U/mL — ABNORMAL HIGH (ref 0.0–38.1)

## 2023-05-25 ENCOUNTER — Telehealth: Payer: Self-pay | Admitting: Internal Medicine

## 2023-05-25 NOTE — Telephone Encounter (Signed)
Just spoke to this patient. She said something about a prescription being sent to the community pharmacy and thought the person she spoke to was named "kathy". She asked me to reach out to you and ask for Dr. Senaida Lange nurse to call her back.

## 2023-05-29 ENCOUNTER — Inpatient Hospital Stay: Payer: Medicare Other | Attending: Internal Medicine | Admitting: Hospice and Palliative Medicine

## 2023-05-29 DIAGNOSIS — Z79899 Other long term (current) drug therapy: Secondary | ICD-10-CM | POA: Insufficient documentation

## 2023-05-29 DIAGNOSIS — C786 Secondary malignant neoplasm of retroperitoneum and peritoneum: Secondary | ICD-10-CM | POA: Insufficient documentation

## 2023-05-29 DIAGNOSIS — F17210 Nicotine dependence, cigarettes, uncomplicated: Secondary | ICD-10-CM | POA: Insufficient documentation

## 2023-05-29 DIAGNOSIS — Z5189 Encounter for other specified aftercare: Secondary | ICD-10-CM | POA: Insufficient documentation

## 2023-05-29 DIAGNOSIS — Z5111 Encounter for antineoplastic chemotherapy: Secondary | ICD-10-CM | POA: Insufficient documentation

## 2023-05-29 DIAGNOSIS — C482 Malignant neoplasm of peritoneum, unspecified: Secondary | ICD-10-CM | POA: Insufficient documentation

## 2023-05-29 NOTE — Progress Notes (Signed)
VM left 

## 2023-06-01 MED FILL — Dexamethasone Sodium Phosphate Inj 100 MG/10ML: INTRAMUSCULAR | Qty: 1 | Status: AC

## 2023-06-01 MED FILL — Fosaprepitant Dimeglumine For IV Infusion 150 MG (Base Eq): INTRAVENOUS | Qty: 5 | Status: AC

## 2023-06-02 ENCOUNTER — Inpatient Hospital Stay: Payer: Medicare Other

## 2023-06-02 ENCOUNTER — Inpatient Hospital Stay (HOSPITAL_BASED_OUTPATIENT_CLINIC_OR_DEPARTMENT_OTHER): Payer: Medicare Other | Admitting: Internal Medicine

## 2023-06-02 ENCOUNTER — Encounter: Payer: Self-pay | Admitting: Internal Medicine

## 2023-06-02 ENCOUNTER — Telehealth: Payer: Self-pay | Admitting: Internal Medicine

## 2023-06-02 ENCOUNTER — Other Ambulatory Visit: Payer: Self-pay | Admitting: *Deleted

## 2023-06-02 VITALS — BP 140/88 | HR 69 | Temp 96.6°F | Ht 63.0 in | Wt 218.6 lb

## 2023-06-02 VITALS — BP 132/80 | HR 65

## 2023-06-02 DIAGNOSIS — C482 Malignant neoplasm of peritoneum, unspecified: Secondary | ICD-10-CM

## 2023-06-02 DIAGNOSIS — Z7189 Other specified counseling: Secondary | ICD-10-CM

## 2023-06-02 DIAGNOSIS — Z5111 Encounter for antineoplastic chemotherapy: Secondary | ICD-10-CM | POA: Diagnosis not present

## 2023-06-02 DIAGNOSIS — Z79899 Other long term (current) drug therapy: Secondary | ICD-10-CM | POA: Diagnosis not present

## 2023-06-02 DIAGNOSIS — Z5189 Encounter for other specified aftercare: Secondary | ICD-10-CM | POA: Diagnosis not present

## 2023-06-02 DIAGNOSIS — F17210 Nicotine dependence, cigarettes, uncomplicated: Secondary | ICD-10-CM | POA: Diagnosis not present

## 2023-06-02 DIAGNOSIS — E876 Hypokalemia: Secondary | ICD-10-CM

## 2023-06-02 DIAGNOSIS — C786 Secondary malignant neoplasm of retroperitoneum and peritoneum: Secondary | ICD-10-CM | POA: Diagnosis not present

## 2023-06-02 LAB — CBC WITH DIFFERENTIAL (CANCER CENTER ONLY)
Abs Immature Granulocytes: 0.1 10*3/uL — ABNORMAL HIGH (ref 0.00–0.07)
Basophils Absolute: 0.1 10*3/uL (ref 0.0–0.1)
Basophils Relative: 2 %
Eosinophils Absolute: 0 10*3/uL (ref 0.0–0.5)
Eosinophils Relative: 1 %
HCT: 37.5 % (ref 36.0–46.0)
Hemoglobin: 12.2 g/dL (ref 12.0–15.0)
Immature Granulocytes: 2 %
Lymphocytes Relative: 39 %
Lymphs Abs: 2.1 10*3/uL (ref 0.7–4.0)
MCH: 32.9 pg (ref 26.0–34.0)
MCHC: 32.5 g/dL (ref 30.0–36.0)
MCV: 101.1 fL — ABNORMAL HIGH (ref 80.0–100.0)
Monocytes Absolute: 0.6 10*3/uL (ref 0.1–1.0)
Monocytes Relative: 11 %
Neutro Abs: 2.5 10*3/uL (ref 1.7–7.7)
Neutrophils Relative %: 45 %
Platelet Count: 134 10*3/uL — ABNORMAL LOW (ref 150–400)
RBC: 3.71 MIL/uL — ABNORMAL LOW (ref 3.87–5.11)
RDW: 16.9 % — ABNORMAL HIGH (ref 11.5–15.5)
WBC Count: 5.5 10*3/uL (ref 4.0–10.5)
nRBC: 0 % (ref 0.0–0.2)

## 2023-06-02 LAB — CMP (CANCER CENTER ONLY)
ALT: 11 U/L (ref 0–44)
AST: 20 U/L (ref 15–41)
Albumin: 3.5 g/dL (ref 3.5–5.0)
Alkaline Phosphatase: 68 U/L (ref 38–126)
Anion gap: 8 (ref 5–15)
BUN: 11 mg/dL (ref 6–20)
CO2: 24 mmol/L (ref 22–32)
Calcium: 8.8 mg/dL — ABNORMAL LOW (ref 8.9–10.3)
Chloride: 106 mmol/L (ref 98–111)
Creatinine: 0.97 mg/dL (ref 0.44–1.00)
GFR, Estimated: >60 mL/min (ref >60)
Glucose, Bld: 131 mg/dL — ABNORMAL HIGH (ref 70–99)
Potassium: 2.9 mmol/L — ABNORMAL LOW (ref 3.5–5.1)
Sodium: 138 mmol/L (ref 135–145)
Total Bilirubin: 0.3 mg/dL (ref 0.3–1.2)
Total Protein: 6.6 g/dL (ref 6.5–8.1)

## 2023-06-02 MED ORDER — DOXORUBICIN HCL LIPOSOMAL CHEMO INJECTION 2 MG/ML
30.0000 mg/m2 | Freq: Once | INTRAVENOUS | Status: AC
  Administered 2023-06-02: 60 mg via INTRAVENOUS
  Filled 2023-06-02: qty 30

## 2023-06-02 MED ORDER — POTASSIUM CHLORIDE 20 MEQ/100ML IV SOLN
20.0000 meq | Freq: Once | INTRAVENOUS | Status: AC
  Administered 2023-06-02: 20 meq via INTRAVENOUS

## 2023-06-02 MED ORDER — PALONOSETRON HCL INJECTION 0.25 MG/5ML
0.2500 mg | Freq: Once | INTRAVENOUS | Status: AC
  Administered 2023-06-02: 0.25 mg via INTRAVENOUS
  Filled 2023-06-02: qty 5

## 2023-06-02 MED ORDER — DIPHENHYDRAMINE HCL 50 MG/ML IJ SOLN
50.0000 mg | Freq: Once | INTRAMUSCULAR | Status: AC
  Administered 2023-06-02: 50 mg via INTRAVENOUS
  Filled 2023-06-02: qty 1

## 2023-06-02 MED ORDER — SODIUM CHLORIDE 0.9 % IV SOLN
658.0000 mg | Freq: Once | INTRAVENOUS | Status: AC
  Administered 2023-06-02: 660 mg via INTRAVENOUS
  Filled 2023-06-02: qty 66

## 2023-06-02 MED ORDER — DEXTROSE 5 % IV SOLN
Freq: Once | INTRAVENOUS | Status: AC
  Filled 2023-06-02: qty 250

## 2023-06-02 MED ORDER — SODIUM CHLORIDE 0.9% FLUSH
10.0000 mL | INTRAVENOUS | Status: DC | PRN
  Filled 2023-06-02: qty 10

## 2023-06-02 MED ORDER — SODIUM CHLORIDE 0.9 % IV SOLN
10.0000 mg | Freq: Once | INTRAVENOUS | Status: AC
  Administered 2023-06-02: 10 mg via INTRAVENOUS
  Filled 2023-06-02: qty 10
  Filled 2023-06-02: qty 1

## 2023-06-02 MED ORDER — FAMOTIDINE IN NACL 20-0.9 MG/50ML-% IV SOLN: 20.0000 mg | Freq: Once | INTRAVENOUS | Status: DC

## 2023-06-02 MED ORDER — FAMOTIDINE 20 MG IN NS 100 ML IVPB
20.0000 mg | Freq: Once | INTRAVENOUS | Status: AC
  Administered 2023-06-02: 20 mg via INTRAVENOUS
  Filled 2023-06-02: qty 100

## 2023-06-02 MED ORDER — HEPARIN SOD (PORK) LOCK FLUSH 100 UNIT/ML IV SOLN
500.0000 [IU] | Freq: Once | INTRAVENOUS | Status: DC | PRN
  Filled 2023-06-02: qty 5

## 2023-06-02 MED ORDER — SODIUM CHLORIDE 0.9 % IV SOLN
150.0000 mg | Freq: Once | INTRAVENOUS | Status: AC
  Administered 2023-06-02: 150 mg via INTRAVENOUS
  Filled 2023-06-02: qty 150
  Filled 2023-06-02: qty 5

## 2023-06-02 NOTE — Patient Instructions (Signed)
[facility]  Discharge Instructions: Thank you for choosing [facility] to provide your oncology and hematology care.  If you have a lab appointment with the Cancer Center, please go directly to the Cancer Center and check in at the registration area.  Wear comfortable clothing and clothing appropriate for easy access to any Portacath or PICC line.   We strive to give you quality time with your provider. You may need to reschedule your appointment if you arrive late (15 or more minutes).  Arriving late affects you and other patients whose appointments are after yours.  Also, if you miss three or more appointments without notifying the office, you may be dismissed from the clinic at the provider's discretion.      For prescription refill requests, have your pharmacy contact our office and allow 72 hours for refills to be completed.    Today you received the following chemotherapy and/or immunotherapy agents- doxil, carboplatin      To help prevent nausea and vomiting after your treatment, we encourage you to take your nausea medication as directed.  BELOW ARE SYMPTOMS THAT SHOULD BE REPORTED IMMEDIATELY: *FEVER GREATER THAN 100.4 F (38 C) OR HIGHER *CHILLS OR SWEATING *NAUSEA AND VOMITING THAT IS NOT CONTROLLED WITH YOUR NAUSEA MEDICATION *UNUSUAL SHORTNESS OF BREATH *UNUSUAL BRUISING OR BLEEDING *URINARY PROBLEMS (pain or burning when urinating, or frequent urination) *BOWEL PROBLEMS (unusual diarrhea, constipation, pain near the anus) TENDERNESS IN MOUTH AND THROAT WITH OR WITHOUT PRESENCE OF ULCERS (sore throat, sores in mouth, or a toothache) UNUSUAL RASH, SWELLING OR PAIN  UNUSUAL VAGINAL DISCHARGE OR ITCHING   Items with * indicate a potential emergency and should be followed up as soon as possible or go to the Emergency Department if any problems should occur.  Please show the CHEMOTHERAPY ALERT CARD or IMMUNOTHERAPY ALERT CARD at  check-in to the Emergency Department and triage nurse.  Should you have questions after your visit or need to cancel or reschedule your appointment, please contact [facility]  [phone] and follow the prompts.  Office hours are 8:00 a.m. to 4:30 p.m. Monday - Friday. Please note that voicemails left after 4:00 p.m. may not be returned until the following business day.  We are closed weekends and major holidays. You have access to a nurse at all times for urgent questions. Please call the main number to the clinic [phone] and follow the prompts.  For any non-urgent questions, you may also contact your provider using MyChart. We now offer e-Visits for anyone 89 and older to request care online for non-urgent symptoms. For details visit [email or web address].   Also download the MyChart app! Go to the app store, search "MyChart", open the app, select [place], and log in with your MyChart username and password.

## 2023-06-02 NOTE — Telephone Encounter (Signed)
Declined oxycodone refill as patient's request is too soon.  GB

## 2023-06-02 NOTE — Progress Notes (Signed)
C/o nausea, she left her meds at home and would like to know if it could be ordered for her to get with tx?

## 2023-06-02 NOTE — Progress Notes (Signed)
Spoke with [facility] pharmacy. Per patient she would like to do Undenyca at home. She will get one more in office on Monday 6/10. She is set up to receive medication at home per [facility] pharmacy.

## 2023-06-02 NOTE — Assessment & Plan Note (Signed)
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 APRIL 4th 2024-recurrent peritoneal carcinomatosis.  No significant ascites.  No evidence of any bowel obstruction.  Last carboplatin-taxol-April 2021. Currently on with Doxil-carboplatin every 4 weeks. APRIL 2024- 2D echo- 55-60%  #  proceed with cycle # 2 Doxil-carboplatin ; tumor markers-improving. CBC CMP are reviewed-proceed with add Udenyca. However, moving forward pt wants undeyca at home.will check with insurance. Pt will need CT CAP after cycle #3.   # Gum bleeding-grade 1 through 2 /on Xarelto -improved currently continue salt/backing soda rinses.   # Nausea/vomiting- G-1-2-continue Zofran and Compazine.  # constipation.  Wanted to on miralax prn.    # Hypocalcemia-pending-Vit D levels; Hypokalemia- severe -2.9 potassium.  Continue  K-Dur twice daily and also 20 of  KCL today.  # Ongoing MSK joints-February 2024 -bone scan negative for malignancy.  On oxycodone /Dr. Borders stable e.    # AN 2024- Brain MRI with and without contrast-nonocclusive dural venous sinus thrombosis noted-  Currently on xarelto.  Stable.     # PN G-2/back pain [awaiting steroid injection]- on Lyrica 50 mg TID/Cymbalta-  Stable.   # Anxiety/Insomnia--social stressors-continue Cymbalta [at 20 mg/day]; continue trazadone 150 mg qhs- see  Stable.   # Hot flashes: on Clonidine patch- on Friday weekly; and clonidine pill 0.1 qhs.  Stable.   #IV access/Mediport-currently s/p TPA- port flush.   # DISPOSITION:  # chemo today; add KCL 20 meq today; NO IVFs over 1 hour # follow up in  July 8th - X- MD; labs- cbc/cmp; ca-125; Doxil- carbo; d-2 - udenyca;  KCL 20 meq -  Dr.B
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
## 2023-06-03 LAB — CA 125: Cancer Antigen (CA) 125: 44.1 U/mL — ABNORMAL HIGH (ref 0.0–38.1)

## 2023-06-05 ENCOUNTER — Inpatient Hospital Stay: Payer: Medicare Other

## 2023-06-05 DIAGNOSIS — Z79899 Other long term (current) drug therapy: Secondary | ICD-10-CM | POA: Diagnosis not present

## 2023-06-05 DIAGNOSIS — Z5111 Encounter for antineoplastic chemotherapy: Secondary | ICD-10-CM | POA: Diagnosis not present

## 2023-06-05 DIAGNOSIS — Z7189 Other specified counseling: Secondary | ICD-10-CM

## 2023-06-05 DIAGNOSIS — C482 Malignant neoplasm of peritoneum, unspecified: Secondary | ICD-10-CM | POA: Diagnosis not present

## 2023-06-05 DIAGNOSIS — Z5189 Encounter for other specified aftercare: Secondary | ICD-10-CM | POA: Diagnosis not present

## 2023-06-05 DIAGNOSIS — F17210 Nicotine dependence, cigarettes, uncomplicated: Secondary | ICD-10-CM | POA: Diagnosis not present

## 2023-06-05 DIAGNOSIS — C786 Secondary malignant neoplasm of retroperitoneum and peritoneum: Secondary | ICD-10-CM | POA: Diagnosis not present

## 2023-06-05 MED ORDER — PEGFILGRASTIM-CBQV 6 MG/0.6ML ~~LOC~~ SOSY
6.0000 mg | PREFILLED_SYRINGE | Freq: Once | SUBCUTANEOUS | Status: AC
  Administered 2023-06-05: 6 mg via SUBCUTANEOUS
  Filled 2023-06-05: qty 0.6

## 2023-06-05 NOTE — Progress Notes (Signed)
Patient self administered udenyca self injection first time. Tolerated well. Patient is fully prepared to administer next dose at home. Patient educated. No concerns voiced. Discharged, stable

## 2023-06-05 NOTE — Patient Instructions (Signed)

## 2023-06-19 ENCOUNTER — Telehealth: Payer: Self-pay | Admitting: *Deleted

## 2023-06-19 NOTE — Telephone Encounter (Signed)
PA completed by phone on Udenyca. Approved reference # [id number].

## 2023-06-21 ENCOUNTER — Encounter: Payer: Self-pay | Admitting: Internal Medicine

## 2023-06-21 ENCOUNTER — Other Ambulatory Visit: Payer: Self-pay | Admitting: Family Medicine

## 2023-06-21 ENCOUNTER — Other Ambulatory Visit: Payer: Self-pay | Admitting: *Deleted

## 2023-06-21 DIAGNOSIS — F331 Major depressive disorder, recurrent, moderate: Secondary | ICD-10-CM

## 2023-06-21 MED ORDER — OXYCODONE HCL 5 MG PO TABS: 5.0000 mg | ORAL_TABLET | Freq: Four times a day (QID) | ORAL | 0 refills | Status: DC | PRN

## 2023-06-30 MED FILL — Fosaprepitant Dimeglumine For IV Infusion 150 MG (Base Eq): INTRAVENOUS | Qty: 5 | Status: AC

## 2023-06-30 MED FILL — Dexamethasone Sodium Phosphate Inj 100 MG/10ML: INTRAMUSCULAR | Qty: 1 | Status: AC

## 2023-07-03 ENCOUNTER — Inpatient Hospital Stay: Payer: Medicare Other

## 2023-07-03 ENCOUNTER — Telehealth: Payer: Self-pay | Admitting: *Deleted

## 2023-07-03 ENCOUNTER — Inpatient Hospital Stay: Payer: Medicare Other | Attending: Internal Medicine | Admitting: Internal Medicine

## 2023-07-03 ENCOUNTER — Inpatient Hospital Stay (HOSPITAL_BASED_OUTPATIENT_CLINIC_OR_DEPARTMENT_OTHER): Payer: Medicare Other | Admitting: Hospice and Palliative Medicine

## 2023-07-03 VITALS — BP 119/83 | HR 86 | Temp 96.6°F | Wt 217.3 lb

## 2023-07-03 DIAGNOSIS — C482 Malignant neoplasm of peritoneum, unspecified: Secondary | ICD-10-CM

## 2023-07-03 DIAGNOSIS — Z515 Encounter for palliative care: Secondary | ICD-10-CM | POA: Diagnosis not present

## 2023-07-03 DIAGNOSIS — Z79899 Other long term (current) drug therapy: Secondary | ICD-10-CM | POA: Insufficient documentation

## 2023-07-03 DIAGNOSIS — G893 Neoplasm related pain (acute) (chronic): Secondary | ICD-10-CM

## 2023-07-03 DIAGNOSIS — R52 Pain, unspecified: Secondary | ICD-10-CM | POA: Diagnosis not present

## 2023-07-03 DIAGNOSIS — Z7189 Other specified counseling: Secondary | ICD-10-CM

## 2023-07-03 DIAGNOSIS — F17210 Nicotine dependence, cigarettes, uncomplicated: Secondary | ICD-10-CM | POA: Insufficient documentation

## 2023-07-03 DIAGNOSIS — Z5111 Encounter for antineoplastic chemotherapy: Secondary | ICD-10-CM | POA: Diagnosis not present

## 2023-07-03 DIAGNOSIS — E876 Hypokalemia: Secondary | ICD-10-CM | POA: Insufficient documentation

## 2023-07-03 LAB — CBC WITH DIFFERENTIAL (CANCER CENTER ONLY)
Abs Immature Granulocytes: 0.04 10*3/uL (ref 0.00–0.07)
Basophils Absolute: 0.1 10*3/uL (ref 0.0–0.1)
Basophils Relative: 2 %
Eosinophils Absolute: 0 10*3/uL (ref 0.0–0.5)
Eosinophils Relative: 1 %
HCT: 36.8 % (ref 36.0–46.0)
Hemoglobin: 12 g/dL (ref 12.0–15.0)
Immature Granulocytes: 1 %
Lymphocytes Relative: 34 %
Lymphs Abs: 1.5 10*3/uL (ref 0.7–4.0)
MCH: 33.9 pg (ref 26.0–34.0)
MCHC: 32.6 g/dL (ref 30.0–36.0)
MCV: 104 fL — ABNORMAL HIGH (ref 80.0–100.0)
Monocytes Absolute: 0.7 10*3/uL (ref 0.1–1.0)
Monocytes Relative: 15 %
Neutro Abs: 2.1 10*3/uL (ref 1.7–7.7)
Neutrophils Relative %: 47 %
Platelet Count: 160 10*3/uL (ref 150–400)
RBC: 3.54 MIL/uL — ABNORMAL LOW (ref 3.87–5.11)
RDW: 19.7 % — ABNORMAL HIGH (ref 11.5–15.5)
WBC Count: 4.3 10*3/uL (ref 4.0–10.5)
nRBC: 0 % (ref 0.0–0.2)

## 2023-07-03 LAB — CMP (CANCER CENTER ONLY)
ALT: 13 U/L (ref 0–44)
AST: 24 U/L (ref 15–41)
Albumin: 4 g/dL (ref 3.5–5.0)
Alkaline Phosphatase: 62 U/L (ref 38–126)
Anion gap: 8 (ref 5–15)
BUN: 14 mg/dL (ref 6–20)
CO2: 24 mmol/L (ref 22–32)
Calcium: 9.3 mg/dL (ref 8.9–10.3)
Chloride: 105 mmol/L (ref 98–111)
Creatinine: 0.93 mg/dL (ref 0.44–1.00)
GFR, Estimated: >60 mL/min (ref >60)
Glucose, Bld: 117 mg/dL — ABNORMAL HIGH (ref 70–99)
Potassium: 3.3 mmol/L — ABNORMAL LOW (ref 3.5–5.1)
Sodium: 137 mmol/L (ref 135–145)
Total Bilirubin: 0.6 mg/dL (ref 0.3–1.2)
Total Protein: 7.4 g/dL (ref 6.5–8.1)

## 2023-07-03 MED ORDER — DIPHENHYDRAMINE HCL 50 MG/ML IJ SOLN
50.0000 mg | Freq: Once | INTRAMUSCULAR | Status: AC
  Administered 2023-07-03: 50 mg via INTRAVENOUS
  Filled 2023-07-03: qty 1

## 2023-07-03 MED ORDER — DEXTROSE 5 % IV SOLN
Freq: Once | INTRAVENOUS | Status: AC
  Filled 2023-07-03: qty 250

## 2023-07-03 MED ORDER — DOXORUBICIN HCL LIPOSOMAL CHEMO INJECTION 2 MG/ML
30.0000 mg/m2 | Freq: Once | INTRAVENOUS | Status: AC
  Administered 2023-07-03: 60 mg via INTRAVENOUS
  Filled 2023-07-03: qty 30

## 2023-07-03 MED ORDER — SODIUM CHLORIDE 0.9 % IV SOLN
681.0000 mg | Freq: Once | INTRAVENOUS | Status: AC
  Administered 2023-07-03: 680 mg via INTRAVENOUS
  Filled 2023-07-03: qty 68

## 2023-07-03 MED ORDER — HEPARIN SOD (PORK) LOCK FLUSH 100 UNIT/ML IV SOLN
500.0000 [IU] | Freq: Once | INTRAVENOUS | Status: AC | PRN
  Administered 2023-07-03: 500 [IU]
  Filled 2023-07-03: qty 5

## 2023-07-03 MED ORDER — SODIUM CHLORIDE 0.9 % IV SOLN
150.0000 mg | Freq: Once | INTRAVENOUS | Status: AC
  Administered 2023-07-03: 150 mg via INTRAVENOUS
  Filled 2023-07-03: qty 150

## 2023-07-03 MED ORDER — FAMOTIDINE IN NACL 20-0.9 MG/50ML-% IV SOLN
20.0000 mg | Freq: Once | INTRAVENOUS | Status: AC
  Administered 2023-07-03: 20 mg via INTRAVENOUS
  Filled 2023-07-03: qty 50

## 2023-07-03 MED ORDER — SODIUM CHLORIDE 0.9 % IV SOLN
10.0000 mg | Freq: Once | INTRAVENOUS | Status: AC
  Administered 2023-07-03: 10 mg via INTRAVENOUS
  Filled 2023-07-03: qty 10

## 2023-07-03 MED ORDER — PALONOSETRON HCL INJECTION 0.25 MG/5ML
0.2500 mg | Freq: Once | INTRAVENOUS | Status: AC
  Administered 2023-07-03: 0.25 mg via INTRAVENOUS
  Filled 2023-07-03: qty 5

## 2023-07-03 MED ORDER — POTASSIUM CHLORIDE CRYS ER 20 MEQ PO TBCR: 20.0000 meq | EXTENDED_RELEASE_TABLET | Freq: Every day | ORAL | 0 refills | Status: DC

## 2023-07-03 MED ORDER — PREDNISONE 10 MG PO TABS: 10.0000 mg | ORAL_TABLET | Freq: Every day | ORAL | 0 refills | Status: DC

## 2023-07-03 NOTE — Telephone Encounter (Signed)
Pt states in office today she has not gotten a udenyca shipment nor has heard anything from a pharmacy. On 6/24 an authorization had been obtained with the understanding [facility] specialty pharmacy would be sending the medication to patient. After holding and speaking to several different people a verbal order for udenyca 6mg /0.6 ml with 1 refill was given to the pharmacist at [facility]. They will ship prescription ASAP. Pt will need by Weds 7/10. Total time spent on phone was 67 minutes.

## 2023-07-03 NOTE — Progress Notes (Signed)
Patient says that she is feeling fatigued. She also says that she needs a 90 day supply refill on her prednisone and to send it to the [facility] on [address] at [address], [place].

## 2023-07-03 NOTE — Progress Notes (Signed)
Palliative Medicine [facility] at [facility] [phone] [phone]) [phone]   Name: [name] Date: 07/03/2023 MRN: [id number]  DOB: 1971-01-22  Patient Care Team: [name], MD as PCP - General (Family Medicine) [name], MD as PCP - Cardiology (Cardiology) [name], RN as Oncology Nurse Navigator Talar Fraley, [name], NP as Nurse Practitioner (Hospice and Palliative Medicine) Earna Coder, MD as Consulting Physician (Internal Medicine) Artelia Laroche, MD as Referring Physician (Obstetrics) [name], Merrily Pew, MD as Consulting Physician (General Surgery) [name], RN as Registered Nurse (Oncology)    REASON FOR CONSULTATION: [name] is a 52 y.o. 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    SOCIAL HISTORY:     reports that she has been smoking cigarettes. She has a 30.00 pack-year smoking history. She has never used smokeless tobacco. She reports that she does not currently use alcohol. She reports that she does not use drugs.  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   Depression    Drug-induced androgenic alopecia    Dyslipidemia 05/17/2022   Dysrhythmia    Family history of breast cancer    GERD (gastroesophageal reflux disease)    Goals of care, counseling/discussion 12/16/2019    Hypertension    Insomnia    Menorrhagia    Migraines    Osteoarthritis    back   Ovarian cancer (HCC) 12/10/2019   Personal history of chemotherapy    ovarian cancer   Plantar fasciitis     PAST SURGICAL HISTORY:  Past Surgical History:  Procedure Laterality Date   ABDOMINAL HYSTERECTOMY  03/2020   APPENDECTOMY     LSC but "ruptured when they did the surgery"   BREAST BIOPSY Left 01/04/2021   MRI BX   CESAREAN SECTION     CYSTOSCOPY N/A 04/08/2020   Procedure: CYSTOSCOPY;  Surgeon: Artelia Laroche, MD;  [name];  Service: Gynecology;  [name];   INSERTION OF MESH N/A 04/26/2021   Procedure: INSERTION OF MESH;  Surgeon: Earline Mayotte, MD;  [name];  Service: General;  [name];   IR THORACENTESIS ASP PLEURAL SPACE W/IMG GUIDE  12/06/2019   IUD REMOVAL N/A 04/08/2020   Procedure: INTRAUTERINE DEVICE (IUD) REMOVAL;  Surgeon: Artelia Laroche, MD;  [name];  Service: Gynecology;  [name];   PARACENTESIS     x6   PORTA CATH INSERTION N/A 04/23/2020   Procedure: PORTA CATH INSERTION;  Surgeon: Annice Needy, MD;  Location: [facility];  Service: Cardiovascular;  [name];   TUBAL LIGATION     at time of CSxn   VENTRAL HERNIA REPAIR N/A 04/26/2021   Procedure: HERNIA REPAIR VENTRAL ADULT;  Surgeon: Earline Mayotte, MD;  [name];  Service: General;  [name];  need RNFA for the case   WRIST SURGERY Left 11/21/2016   plates and screws inserted  HEMATOLOGY/ONCOLOGY HISTORY:  Oncology History Overview Note  # DEC 2020- ADENO CA [s/p Pleural effusion]; [name]; [name]; abdominal ascites status post paracentesis x2; adenocarcinoma; [name]; omental caking/peritoneal disease/no obvious evidence of bowel involvement; no adnexal  masses readily noted; Ca [phone].   # 12/23/2019- Carbo-Taxol #1; Jan 18 th 2021- #2 carbo-Taxol-Bev status post 4 cycles-April 08, 2020-debulking surgery [Dr. Secord] miliary disease noted post surgery. Carbo-Taxol-Avastin x6  # July 6th 2021- Avastin q 3 W+ OLAPARIB 300 mg BID  # OCT 26th, 2021-recurrent anemia [hemoglobin 7.5]; [name]; [name], [name]; [name]; [name], 2022 ~restart olaparib 200 mg twice daily_+ Avastin.   # Jan 15th 2021- L UE SVTxarelto; [name]; [name]; mid May 2021-Xarelto 10 mg a day/prophylaxis.  # JAN 2024- Brain MRI with and without contrast-nonocclusive dural venous sinus thrombosis noted-without any brain infarct or any metastatic disease to the brain.  Lovenox/ NOAK for long-term needs. HOLD avastin.  # APRIL 26th  2024#1-- Doxil-carboplatin every 4 weeks.  # BRCA-1 [on screening; s/p genetics counseling; [name]; July 2019- 2-3cm-right complex ovarian cyst- likely benign/hemorrhagic [also 2011].  # #December 2021 screening breast MRI-left breast 9 mm lesion biopsy; apocrine metaplasia/benign; annual MRI.    DIAGNOSIS: Primary peritoneal adenocarcinoma  STAGE:   IV      ;  GOALS: control    Primary peritoneal carcinomatosis (HCC)  12/16/2019 Initial Diagnosis   Primary peritoneal adenocarcinoma (HCC)   12/23/2019 - 07/08/2022 Chemotherapy   Patient is on Treatment Plan : Carboplatin + Paclitaxel + Mvasi q21d     12/23/2019 - 12/20/2022 Chemotherapy   Patient is on Treatment Plan : OVARIAN Carboplatin + Paclitaxel + Bevacizumab q21d      01/07/2021 Cancer Staging   Staging form: Ovary, Fallopian Tube, and Primary Peritoneal Carcinoma, AJCC 8th Edition - Clinical: Stage IVA (pM1a) - Signed by Earna Coder, MD on  01/07/2021   04/21/2023 -  Chemotherapy   Patient is on Treatment Plan : OVARIAN RECURRENT Liposomal Doxorubicin + Carboplatin q28d X 6 Cycles       ALLERGIES:  is allergic to hydroxyzine hcl.  MEDICATIONS:  Current Outpatient Medications  Medication Sig Dispense Refill   buPROPion (WELLBUTRIN XL) 300 MG 24 hr tablet Take 1 tablet (300 mg total) by mouth daily. 90 tablet 0   cariprazine (VRAYLAR) 1.5 MG capsule Take 1 capsule (1.5 mg total) by mouth daily. 90 capsule 0   cloNIDine (CATAPRES - DOSED IN MG/24 HR) 0.3 mg/24hr patch Place 1 patch (0.3 mg total) onto the skin once a week. Friday 12 patch 3   cloNIDine (CATAPRES) 0.1 MG tablet Take 1 tablet (0.1 mg total) by mouth at bedtime. 90 tablet 1   cyclobenzaprine (FLEXERIL) 5 MG tablet Take 5 mg by mouth 3 (three) times daily as needed for muscle spasms.     docusate sodium (COLACE) 100 MG capsule Take 100 mg by mouth 2 (two) times daily.     DULoxetine (CYMBALTA) 60 MG capsule Take 1 capsule (60 mg total) by mouth daily. 90 capsule 0   LORazepam (ATIVAN) 0.5 MG tablet Take 1 tablet (0.5 mg total) by mouth every 12 (twelve) hours as needed for anxiety. 60 tablet 0  magnesium citrate SOLN 1 Bottle as needed. (Patient not taking: Reported on 04/28/2023)     meclizine (ANTIVERT) 25 MG tablet 1 pill at night as needed; if dizziness not improved can take one pill every 8 hours as needed/tolerated. 30 tablet 0   Multiple Vitamin (MULTIVITAMIN WITH MINERALS) TABS tablet Take 1 tablet by mouth daily.     nystatin (MYCOSTATIN/NYSTOP) powder Apply 1 Application topically 3 (three) times daily. 60 g 0   omeprazole (PRILOSEC) 20 MG capsule TAKE 1 CAPSULE(20 MG) BY MOUTH DAILY 90 capsule 0   ondansetron (ZOFRAN-ODT) 8 MG disintegrating tablet Take 1 tablet (8 mg total) by mouth every 8 (eight) hours as needed for nausea or vomiting. 60 tablet 3   oxyCODONE (OXY IR/ROXICODONE) 5 MG immediate release tablet Take 1 tablet (5 mg total) by mouth every 6  (six) hours as needed for severe pain. 75 tablet 0   polyethylene glycol powder (GLYCOLAX/MIRALAX) 17 GM/SCOOP powder Take 0.5 Containers by mouth daily as needed for mild constipation or moderate constipation.     potassium chloride SA (KLOR-CON M) 20 MEQ tablet Take 1 tablet (20 mEq total) by mouth daily. 90 tablet 0   predniSONE (DELTASONE) 10 MG tablet Take 1 tablet (10 mg total) by mouth daily with breakfast. 90 tablet 0   pregabalin (LYRICA) 150 MG capsule Take 150 mg by mouth 3 (three) times daily.     prochlorperazine (COMPAZINE) 10 MG tablet Take 1 tablet (10 mg total) by mouth every 6 (six) hours as needed for nausea or vomiting. 40 tablet 3   rivaroxaban (XARELTO) 20 MG TABS tablet Take 1 tablet (20 mg total) by mouth daily with supper. 30 tablet 1   rosuvastatin (CRESTOR) 5 MG tablet TAKE 1 TABLET BY MOUTH ONCE DAILY. IN PLACE OF ATORVASTATIN 90 tablet 0   senna (SENOKOT) 8.6 MG tablet Take 1 tablet by mouth daily as needed for constipation.     SUMAtriptan (IMITREX) 100 MG tablet Take 1 tablet (100 mg total) by mouth every 2 (two) hours as needed for migraine. May repeat in 2 hours if headache persists or recurs. 10 tablet 2   tiotropium (SPIRIVA HANDIHALER) 18 MCG inhalation capsule Place 1 capsule (18 mcg total) into inhaler and inhale daily. 90 capsule 1   tirzepatide (MOUNJARO) 5 MG/0.5ML Pen Inject 5 mg into the skin once a week. 6 mL 0   traZODone (DESYREL) 150 MG tablet Take 1 tablet (150 mg total) by mouth at bedtime as needed for sleep. 90 tablet 1   valACYclovir (VALTREX) 1000 MG tablet Take 1 tablet (1,000 mg total) by mouth 2 (two) times daily as needed. 6 tablet 2   No current facility-administered medications for this visit.   Facility-Administered Medications Ordered in Other Visits  Medication Dose Route Frequency Provider Last Rate Last Admin   heparin lock flush 100 unit/mL  250 Units Intracatheter Once PRN Earna Coder, MD       ondansetron [facility]) 4  MG/2ML injection            sodium chloride flush (NS) 0.9 % injection 10 mL  10 mL Intravenous Once Earna Coder, MD        VITAL SIGNS: LMP 06/19/2017  There were no vitals filed for this visit.  Estimated body mass index is 38.49 kg/m as calculated from the following:   Height as of 06/02/23: 5\' 3"  (1.6 m).   Weight as of an earlier encounter on 07/03/23: 217 lb 4.8 oz (98.6 kg).  LABS: CBC:    Component Value Date/Time   WBC 4.3 07/03/2023 0908   WBC 4.8 03/10/2023 1302   HGB 12.0 07/03/2023 0908   HCT 36.8 07/03/2023 0908   PLT 160 07/03/2023 0908   MCV 104.0 (H) 07/03/2023 0908   NEUTROABS 2.1 07/03/2023 0908   LYMPHSABS 1.5 07/03/2023 0908   MONOABS 0.7 07/03/2023 0908   EOSABS 0.0 07/03/2023 0908   BASOSABS 0.1 07/03/2023 0908   Comprehensive Metabolic Panel:    Component Value Date/Time   NA 137 07/03/2023 0908   K 3.3 (L) 07/03/2023 0908   CL 105 07/03/2023 0908   CO2 24 07/03/2023 0908   BUN 14 07/03/2023 0908   CREATININE 0.93 07/03/2023 0908   CREATININE 0.79 10/25/2019 0000   GLUCOSE 117 (H) 07/03/2023 0908   CALCIUM 9.3 07/03/2023 0908   AST 24 07/03/2023 0908   ALT 13 07/03/2023 0908   ALKPHOS 62 07/03/2023 0908   BILITOT 0.6 07/03/2023 0908   PROT 7.4 07/03/2023 0908   ALBUMIN 4.0 07/03/2023 0908    RADIOGRAPHIC STUDIES: No results found.  PERFORMANCE STATUS (ECOG) : 1 - Symptomatic but completely ambulatory  Review of Systems Unless otherwise noted, a complete review of systems is negative.  Physical Exam General: NAD Pulmonary: Unlabored. Extremities: no edema, no joint deformities Skin: no rashes Neurological: Weakness but otherwise nonfocal  IMPRESSION: Follow-up visit.  Patient reports she is doing about the same.  She has ongoing fatigue and reports generalized musculoskeletal pain when she wakes in the morning.  She also has ongoing pain in the pelvis.  She previously had improvement with the symptoms on steroids and note  plan to restart steroids today.  Patient states that she continues to take oxycodone 3-4 times daily with good relief.  She denies any adverse effects from pain medications.  No misuse reported.  Reviewed ACP documents and MOST form.  Patient states that she thinks she wants her sister to be her healthcare power of attorney.  PLAN: -Continue current scope of treatment -Continue as needed oxycodone -ACP/MOST Form reviewed -Follow-up telephone visit 2 months   Patient expressed understanding and was in agreement with this plan. She also understands that She can call the clinic at any time with any questions, concerns, or complaints.     Time Total: 15 minutes  Visit consisted of counseling and education dealing with the complex and emotionally intense issues of symptom management and palliative care in the setting of serious and potentially life-threatening illness.Greater than 50%  of this time was spent counseling and coordinating care related to the above assessment and plan.  Signed by: Laurette Schimke, PhD, NP-C
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
## 2023-07-04 ENCOUNTER — Inpatient Hospital Stay: Payer: Medicare Other

## 2023-07-04 LAB — CA 125: Cancer Antigen (CA) 125: 29 U/mL (ref 0.0–38.1)

## 2023-07-05 ENCOUNTER — Telehealth: Payer: Self-pay | Admitting: *Deleted

## 2023-07-05 NOTE — Telephone Encounter (Signed)
Pt has not received her udenyca from mail order from [facility]. I called [facility] and they said medication is ready to be shipped. They will be contacting pt by phone. This was set up on Monday to be exudited. The CS rep gave me the phone # for patient to call herself this afternoon. I called pt back and gave her the following phone # to call now. [phone]

## 2023-07-06 ENCOUNTER — Encounter: Payer: Self-pay | Admitting: Internal Medicine

## 2023-07-06 NOTE — Telephone Encounter (Signed)
Spoke to patient and she stated the company altered her that the medication will be delivered today and she will give self injection. Last update was out for delivery. Advised her to call first thing in the morning if medication doesn't arrive so she can get scheduled in clinic [place]. Patient verbalizes understanding

## 2023-07-09 ENCOUNTER — Other Ambulatory Visit: Payer: Self-pay | Admitting: Hospice and Palliative Medicine

## 2023-07-10 ENCOUNTER — Encounter: Payer: Self-pay | Admitting: Internal Medicine

## 2023-07-10 MED ORDER — OXYCODONE HCL 5 MG PO TABS: 5.0000 mg | ORAL_TABLET | Freq: Four times a day (QID) | ORAL | 0 refills | Status: DC | PRN

## 2023-07-13 NOTE — Progress Notes (Deleted)
Name: [name]   MRN: [id number]    DOB: January 20, 1971   Date:07/13/2023       Progress Note  Subjective  Chief Complaint  Follow up   HPI  HTN/Tachycardia: currently on metoprolol  25 mg and heart rate is normal now, bp is at goal ,no chest pain palpitation or SOB  Chronic back pain: still under the care of ortho and takes Lyrica and flexeril and symptoms are controlled at this time  She will have an ablation next week    Morbid obesity:she was diagnosed with carcinomatosis  in 11/2019 at a weight of 259 lbs. It went up to 271 lbs 08/2021, she was seen by [facility] August 2023 weight was 242 lbs. She has been eating healthier and started on Ozempic, weight went down to  234 lbs. Last visit we changed to [facility] and lost another 6 lbs. BMI still over 35 with co-morbidities.    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 Joice Lofts. 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 She takes Duloxetine and Lyrica and seems to be controlling symptoms of neuropathy, pain level is 2/10 Still under the care of oncologist    Insomnia: she is off  Lunesta 2 mg because it was helping her fall but not stay asleep. She is now taking trazodone but no longer working, she will discuss it with oncologist    Cardiomegaly: on CT scan, however normal echo   Smoker's cough: trying to quit now, she has symbicort at home and takes it prn, however states insurance no longer going to cover, we will try sending spiriva today. She has SOB with moderate activity    Migraines: she stopped topamax, taking imitrex prn, she denies recent episodes of migraine . No recent episodes. She is having dull headaches   Depression  Major: recurrent, going on for a long time, she is back on Duloxetine since diagnosed with cancer Dec 2020. Wellbutrin was added but is under more stress, ,moved out of her oldest daughter's [place]) now her youngest daughter moved in with her and also her son. She states struggling financially. She needs to move out . We add a mood stabilizer on her last visit , she states Abilify helped a little but still very depressed we will try switching to Vraylar  Dyslipidemia: LDL was very high and has atherosclerosis of aorta we will try adding statin therapy today   The 10-year ASCVD risk score (Arnett DK, et al., 2019) is: 3.9%   Values used to calculate the score:     Age: 52 years     Sex: Female     Is Non-Hispanic African American: No     Diabetic: No     Tobacco smoker: Yes     Systolic Blood Pressure: 120 mmHg     Is BP treated: No     HDL Cholesterol: 58 mg/dL     Total Cholesterol: 249 mg/dL   Pre-diabetes: N8G is 6 % on mounjarno, denies polyphagia, polydipsia or polyuria   Patient Active Problem List   Diagnosis Date Noted   Hypokalemia 07/03/2023   Complaints of total body pain  01/31/2023   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   Dural sinus thrombosis 01/10/2023   DDD (degenerative disc disease), lumbar 09/28/2022   Intertrigo 09/28/2022   Metabolic syndrome 09/28/2022   Moderate episode of recurrent major depressive disorder (HCC) 06/17/2022   Smokers' cough (HCC) 05/17/2022   Dyslipidemia 05/17/2022   Elevated TSH 05/17/2022   Pre-diabetes 05/17/2022   Ventral hernia without obstruction or gangrene 04/26/2021   Malignant ascites 12/22/2019   Pleural effusion on right 12/22/2019   Tachycardia 12/22/2019   Primary peritoneal carcinomatosis (HCC) 12/16/2019   Goals of care, counseling/discussion 12/16/2019   Erythrocytosis 11/12/2019   Morbid obesity (HCC) 07/07/2018   BRCA1 gene mutation positive 06/18/2018   Fever blister 05/17/2018   Family history of breast  cancer 05/08/2018   Family history of colon cancer in mother 05/08/2018   Migraine with aura and without status migrainosus 04/18/2018   Mild recurrent major depression (HCC) 01/20/2016   GERD without esophagitis 01/20/2016   Osteoarthritis, multiple sites 01/20/2016    Past Surgical History:  Procedure Laterality Date   ABDOMINAL HYSTERECTOMY  03/2020   APPENDECTOMY     LSC but "ruptured when they did the surgery"   BREAST BIOPSY Left 01/04/2021   MRI BX   CESAREAN SECTION     CYSTOSCOPY N/A 04/08/2020   Procedure: CYSTOSCOPY;  Surgeon: Artelia Laroche, MD;  [name];  Service: Gynecology;  [name];   INSERTION OF MESH N/A 04/26/2021   Procedure: INSERTION OF MESH;  Surgeon: Earline Mayotte, MD;  [name];  Service: General;  [name];   IR THORACENTESIS ASP PLEURAL SPACE W/IMG GUIDE  12/06/2019   IUD REMOVAL N/A 04/08/2020   Procedure: INTRAUTERINE DEVICE (IUD) REMOVAL;  Surgeon: Artelia Laroche, MD;  [name];  Service: Gynecology;  [name];   PARACENTESIS     x6   PORTA CATH INSERTION N/A 04/23/2020   Procedure: PORTA CATH INSERTION;  Surgeon: Annice Needy, MD;  Location: [facility];  Service: Cardiovascular;  [name];   TUBAL LIGATION     at time of CSxn   VENTRAL HERNIA REPAIR N/A 04/26/2021   Procedure: HERNIA REPAIR VENTRAL ADULT;  Surgeon: Earline Mayotte, MD;  [name];  Service: General;  [name];  need RNFA for the case   WRIST SURGERY Left 11/21/2016   plates and screws inserted    Family History  Adopted: Yes  Problem Relation Age of Onset   Lung cancer Father        deceased 53   Breast cancer Mother 31       currently 59   Colon cancer Mother    ADD / ADHD Son    ADD / ADHD Son    Early death Maternal Aunt    Breast cancer Maternal Aunt 34       deceased 59   Breast cancer Maternal Grandmother    Depression Daughter    Depression Daughter     Prostate cancer Paternal Uncle    Stroke Paternal Uncle    Leukemia Paternal Aunt    Breast cancer Paternal Grandmother    Cancer Maternal Uncle     Social History   Tobacco Use   Smoking status: Every Day    Current packs/day: 1.00    Average packs/day: 1 pack/day for 30.0 years (30.0 ttl pk-yrs)    Types: Cigarettes   Smokeless tobacco: Never  Substance Use Topics   Alcohol use: Not Currently  Alcohol/week: 0.0 standard drinks of alcohol     Current Outpatient Medications:    buPROPion (WELLBUTRIN XL) 300 MG 24 hr tablet, Take 1 tablet (300 mg total) by mouth daily., Disp: 90 tablet, Rfl: 0   cariprazine (VRAYLAR) 1.5 MG capsule, Take 1 capsule (1.5 mg total) by mouth daily., Disp: 90 capsule, Rfl: 0   cloNIDine (CATAPRES - DOSED IN MG/24 HR) 0.3 mg/24hr patch, Place 1 patch (0.3 mg total) onto the skin once a week. Friday, Disp: 12 patch, Rfl: 3   cloNIDine (CATAPRES) 0.1 MG tablet, Take 1 tablet (0.1 mg total) by mouth at bedtime., Disp: 90 tablet, Rfl: 1   cyclobenzaprine (FLEXERIL) 5 MG tablet, Take 5 mg by mouth 3 (three) times daily as needed for muscle spasms., Disp: , Rfl:    docusate sodium (COLACE) 100 MG capsule, Take 100 mg by mouth 2 (two) times daily., Disp: , Rfl:    DULoxetine (CYMBALTA) 60 MG capsule, Take 1 capsule (60 mg total) by mouth daily., Disp: 90 capsule, Rfl: 0   LORazepam (ATIVAN) 0.5 MG tablet, Take 1 tablet (0.5 mg total) by mouth every 12 (twelve) hours as needed for anxiety., Disp: 60 tablet, Rfl: 0   magnesium citrate SOLN, 1 Bottle as needed. (Patient not taking: Reported on 04/28/2023), Disp: , Rfl:    meclizine (ANTIVERT) 25 MG tablet, 1 pill at night as needed; if dizziness not improved can take one pill every 8 hours as needed/tolerated., Disp: 30 tablet, Rfl: 0   Multiple Vitamin (MULTIVITAMIN WITH MINERALS) TABS tablet, Take 1 tablet by mouth daily., Disp: , Rfl:    nystatin (MYCOSTATIN/NYSTOP) powder, Apply 1 Application topically 3 (three)  times daily., Disp: 60 g, Rfl: 0   omeprazole (PRILOSEC) 20 MG capsule, TAKE 1 CAPSULE(20 MG) BY MOUTH DAILY, Disp: 90 capsule, Rfl: 0   ondansetron (ZOFRAN-ODT) 8 MG disintegrating tablet, Take 1 tablet (8 mg total) by mouth every 8 (eight) hours as needed for nausea or vomiting., Disp: 60 tablet, Rfl: 3   oxyCODONE (OXY IR/ROXICODONE) 5 MG immediate release tablet, Take 1 tablet (5 mg total) by mouth every 6 (six) hours as needed for severe pain., Disp: 75 tablet, Rfl: 0   polyethylene glycol powder (GLYCOLAX/MIRALAX) 17 GM/SCOOP powder, Take 0.5 Containers by mouth daily as needed for mild constipation or moderate constipation., Disp: , Rfl:    potassium chloride SA (KLOR-CON M) 20 MEQ tablet, Take 1 tablet (20 mEq total) by mouth daily., Disp: 90 tablet, Rfl: 0   predniSONE (DELTASONE) 10 MG tablet, Take 1 tablet (10 mg total) by mouth daily with breakfast., Disp: 90 tablet, Rfl: 0   pregabalin (LYRICA) 150 MG capsule, Take 150 mg by mouth 3 (three) times daily., Disp: , Rfl:    prochlorperazine (COMPAZINE) 10 MG tablet, Take 1 tablet (10 mg total) by mouth every 6 (six) hours as needed for nausea or vomiting., Disp: 40 tablet, Rfl: 3   rivaroxaban (XARELTO) 20 MG TABS tablet, Take 1 tablet (20 mg total) by mouth daily with supper., Disp: 30 tablet, Rfl: 1   rosuvastatin (CRESTOR) 5 MG tablet, TAKE 1 TABLET BY MOUTH ONCE DAILY. IN PLACE OF ATORVASTATIN, Disp: 90 tablet, Rfl: 0   senna (SENOKOT) 8.6 MG tablet, Take 1 tablet by mouth daily as needed for constipation., Disp: , Rfl:    SUMAtriptan (IMITREX) 100 MG tablet, Take 1 tablet (100 mg total) by mouth every 2 (two) hours as needed for migraine. May repeat in 2 hours if headache persists or  recurs., Disp: 10 tablet, Rfl: 2   tiotropium (SPIRIVA HANDIHALER) 18 MCG inhalation capsule, Place 1 capsule (18 mcg total) into inhaler and inhale daily., Disp: 90 capsule, Rfl: 1   tirzepatide (MOUNJARO) 5 MG/0.5ML Pen, Inject 5 mg into the skin once a  week., Disp: 6 mL, Rfl: 0   traZODone (DESYREL) 150 MG tablet, Take 1 tablet (150 mg total) by mouth at bedtime as needed for sleep., Disp: 90 tablet, Rfl: 1   valACYclovir (VALTREX) 1000 MG tablet, Take 1 tablet (1,000 mg total) by mouth 2 (two) times daily as needed., Disp: 6 tablet, Rfl: 2 No current facility-administered medications for this visit.  Facility-Administered Medications Ordered in Other Visits:    heparin lock flush 100 unit/mL, 250 Units, Intracatheter, Once PRN, Earna Coder, MD   ondansetron [facility]) 4 MG/2ML injection, , , ,    sodium chloride flush (NS) 0.9 % injection 10 mL, 10 mL, Intravenous, Once, Donneta Romberg, Worthy Flank, MD  Allergies  Allergen Reactions   Hydroxyzine Hcl Other (See Comments)    Makes her loopy/jittery    I personally reviewed active problem list, medication list, allergies, family history, social history, health maintenance with the patient/caregiver today.   ROS  ***  Objective  There were no vitals filed for this visit.  There is no height or weight on file to calculate BMI.  Physical Exam ***   PHQ2/9:    01/19/2023   11:11 AM 01/04/2023   11:47 AM 12/12/2022    4:32 PM 09/28/2022   11:06 AM 09/28/2022   10:44 AM  Depression screen PHQ 2/9  Decreased Interest 2 2 0 3 2  Down, Depressed, Hopeless 3 2 3 3 2   PHQ - 2 Score 5 4 3 6 4   Altered sleeping 3 3 0 2 2  Tired, decreased energy 2 2 3 3 2   Change in appetite 0 0 0  0  Feeling bad or failure about yourself  2 2 3 3  0  Trouble concentrating 2 2 3 2  0  Moving slowly or fidgety/restless 0 0 0  0  Suicidal thoughts 0 0 0  0  PHQ-9 Score 14 13 12 16 8   Difficult doing work/chores Very difficult Somewhat difficult Somewhat difficult      phq 9 is {gen pos [id number]}   Fall Risk:    01/19/2023   11:02 AM 09/28/2022   10:57 AM 08/05/2022    9:18 AM 06/17/2022    2:31 PM 05/17/2022   11:24 AM  Fall Risk   Falls in the past year? 0 0 0 0 1  Number falls in past  yr:  0 0 0 1  Injury with Fall?  0 0 0 0  Risk for fall due to : No Fall Risks No Fall Risks No Fall Risks No Fall Risks No Fall Risks  Follow up Falls prevention discussed;Education provided Falls prevention discussed Falls prevention discussed;Education provided Falls prevention discussed;Education provided Falls prevention discussed      Functional Status Survey:      Assessment & Plan  *** There are no diagnoses linked to this encounter.

## 2023-07-14 ENCOUNTER — Ambulatory Visit: Payer: Medicare Other | Admitting: Family Medicine

## 2023-07-17 ENCOUNTER — Ambulatory Visit: Admission: RE | Admit: 2023-07-17 | Payer: Medicare Other | Source: Ambulatory Visit

## 2023-07-18 ENCOUNTER — Other Ambulatory Visit: Payer: Self-pay | Admitting: Internal Medicine
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
## 2023-07-21 ENCOUNTER — Ambulatory Visit: Admission: RE | Admit: 2023-07-21 | Payer: Medicare Other | Source: Ambulatory Visit

## 2023-07-24 ENCOUNTER — Ambulatory Visit: Payer: Medicare Other | Admitting: Family Medicine

## 2023-07-24 ENCOUNTER — Encounter: Payer: Self-pay | Admitting: Family Medicine

## 2023-07-24 ENCOUNTER — Other Ambulatory Visit: Payer: Self-pay | Admitting: Family Medicine

## 2023-07-24 DIAGNOSIS — G08 Intracranial and intraspinal phlebitis and thrombophlebitis: Secondary | ICD-10-CM

## 2023-07-24 DIAGNOSIS — G893 Neoplasm related pain (acute) (chronic): Secondary | ICD-10-CM

## 2023-07-24 DIAGNOSIS — F331 Major depressive disorder, recurrent, moderate: Secondary | ICD-10-CM

## 2023-07-24 DIAGNOSIS — R519 Headache, unspecified: Secondary | ICD-10-CM

## 2023-07-26 ENCOUNTER — Ambulatory Visit
Admission: RE | Admit: 2023-07-26 | Discharge: 2023-07-26 | Disposition: A | Discharge status: Home / Self Care | Payer: Medicare Other | Source: Ambulatory Visit | Attending: Internal Medicine | Admitting: Internal Medicine

## 2023-07-26 ENCOUNTER — Encounter: Payer: Self-pay | Admitting: Internal Medicine

## 2023-07-26 DIAGNOSIS — C569 Malignant neoplasm of unspecified ovary: Secondary | ICD-10-CM | POA: Diagnosis not present

## 2023-07-26 DIAGNOSIS — C482 Malignant neoplasm of peritoneum, unspecified: Secondary | ICD-10-CM | POA: Diagnosis not present

## 2023-07-26 DIAGNOSIS — K429 Umbilical hernia without obstruction or gangrene: Secondary | ICD-10-CM | POA: Diagnosis not present

## 2023-07-26 DIAGNOSIS — K439 Ventral hernia without obstruction or gangrene: Secondary | ICD-10-CM | POA: Diagnosis not present

## 2023-07-26 MED ORDER — IOHEXOL 300 MG/ML  SOLN
100.0000 mL | Freq: Once | INTRAMUSCULAR | Status: AC | PRN
  Administered 2023-07-26: 100 mL via INTRAVENOUS

## 2023-07-26 MED ORDER — BARIUM SULFATE 2 % PO SUSP
450.0000 mL | ORAL | Status: AC
  Administered 2023-07-26 (×2): 450 mL via ORAL

## 2023-07-26 MED ORDER — HEPARIN SOD (PORK) LOCK FLUSH 100 UNIT/ML IV SOLN
500.0000 [IU] | Freq: Once | INTRAVENOUS | Status: AC
  Administered 2023-07-26: 500 [IU] via INTRAVENOUS

## 2023-07-27 ENCOUNTER — Other Ambulatory Visit: Payer: Self-pay | Admitting: Hospice and Palliative Medicine

## 2023-07-28 ENCOUNTER — Encounter: Payer: Self-pay | Admitting: Internal Medicine

## 2023-07-28 MED ORDER — OXYCODONE HCL 5 MG PO TABS: 5.0000 mg | ORAL_TABLET | Freq: Four times a day (QID) | ORAL | 0 refills | Status: DC | PRN

## 2023-07-31 ENCOUNTER — Telehealth: Payer: Self-pay | Admitting: *Deleted

## 2023-07-31 ENCOUNTER — Inpatient Hospital Stay (HOSPITAL_BASED_OUTPATIENT_CLINIC_OR_DEPARTMENT_OTHER): Payer: Medicare HMO | Admitting: Internal Medicine

## 2023-07-31 ENCOUNTER — Encounter: Payer: Self-pay | Admitting: Internal Medicine

## 2023-07-31 ENCOUNTER — Inpatient Hospital Stay: Payer: Medicare HMO

## 2023-07-31 ENCOUNTER — Inpatient Hospital Stay: Payer: Medicare HMO | Attending: Internal Medicine

## 2023-07-31 ENCOUNTER — Telehealth: Payer: Medicare Other | Admitting: Hospice and Palliative Medicine

## 2023-07-31 VITALS — BP 132/81 | HR 81 | Temp 97.3°F | Ht 63.0 in | Wt 220.4 lb

## 2023-07-31 DIAGNOSIS — Z7901 Long term (current) use of anticoagulants: Secondary | ICD-10-CM | POA: Diagnosis not present

## 2023-07-31 DIAGNOSIS — N951 Menopausal and female climacteric states: Secondary | ICD-10-CM | POA: Insufficient documentation

## 2023-07-31 DIAGNOSIS — Z90722 Acquired absence of ovaries, bilateral: Secondary | ICD-10-CM | POA: Diagnosis not present

## 2023-07-31 DIAGNOSIS — Z5111 Encounter for antineoplastic chemotherapy: Secondary | ICD-10-CM | POA: Insufficient documentation

## 2023-07-31 DIAGNOSIS — M791 Myalgia, unspecified site: Secondary | ICD-10-CM | POA: Insufficient documentation

## 2023-07-31 DIAGNOSIS — K5900 Constipation, unspecified: Secondary | ICD-10-CM | POA: Diagnosis not present

## 2023-07-31 DIAGNOSIS — Z1509 Genetic susceptibility to other malignant neoplasm: Secondary | ICD-10-CM | POA: Insufficient documentation

## 2023-07-31 DIAGNOSIS — Z7189 Other specified counseling: Secondary | ICD-10-CM

## 2023-07-31 DIAGNOSIS — Z90710 Acquired absence of both cervix and uterus: Secondary | ICD-10-CM | POA: Insufficient documentation

## 2023-07-31 DIAGNOSIS — Z148 Genetic carrier of other disease: Secondary | ICD-10-CM | POA: Insufficient documentation

## 2023-07-31 DIAGNOSIS — Z1501 Genetic susceptibility to malignant neoplasm of breast: Secondary | ICD-10-CM | POA: Insufficient documentation

## 2023-07-31 DIAGNOSIS — F5101 Primary insomnia: Secondary | ICD-10-CM | POA: Insufficient documentation

## 2023-07-31 DIAGNOSIS — E876 Hypokalemia: Secondary | ICD-10-CM | POA: Insufficient documentation

## 2023-07-31 DIAGNOSIS — Z1502 Genetic susceptibility to malignant neoplasm of ovary: Secondary | ICD-10-CM | POA: Diagnosis not present

## 2023-07-31 DIAGNOSIS — F17210 Nicotine dependence, cigarettes, uncomplicated: Secondary | ICD-10-CM | POA: Insufficient documentation

## 2023-07-31 DIAGNOSIS — M2550 Pain in unspecified joint: Secondary | ICD-10-CM | POA: Insufficient documentation

## 2023-07-31 DIAGNOSIS — R112 Nausea with vomiting, unspecified: Secondary | ICD-10-CM | POA: Diagnosis not present

## 2023-07-31 DIAGNOSIS — Z79891 Long term (current) use of opiate analgesic: Secondary | ICD-10-CM | POA: Diagnosis not present

## 2023-07-31 DIAGNOSIS — Z79899 Other long term (current) drug therapy: Secondary | ICD-10-CM | POA: Diagnosis not present

## 2023-07-31 DIAGNOSIS — Z86718 Personal history of other venous thrombosis and embolism: Secondary | ICD-10-CM | POA: Diagnosis not present

## 2023-07-31 DIAGNOSIS — C482 Malignant neoplasm of peritoneum, unspecified: Secondary | ICD-10-CM | POA: Diagnosis not present

## 2023-07-31 DIAGNOSIS — F419 Anxiety disorder, unspecified: Secondary | ICD-10-CM | POA: Diagnosis not present

## 2023-07-31 LAB — CMP (CANCER CENTER ONLY)
ALT: 12 U/L (ref 0–44)
AST: 19 U/L (ref 15–41)
Albumin: 3.7 g/dL (ref 3.5–5.0)
Alkaline Phosphatase: 60 U/L (ref 38–126)
Anion gap: 7 (ref 5–15)
BUN: 10 mg/dL (ref 6–20)
CO2: 24 mmol/L (ref 22–32)
Calcium: 8.8 mg/dL — ABNORMAL LOW (ref 8.9–10.3)
Chloride: 106 mmol/L (ref 98–111)
Creatinine: 0.87 mg/dL (ref 0.44–1.00)
GFR, Estimated: >60 mL/min (ref >60)
Glucose, Bld: 146 mg/dL — ABNORMAL HIGH (ref 70–99)
Potassium: 3.1 mmol/L — ABNORMAL LOW (ref 3.5–5.1)
Sodium: 137 mmol/L (ref 135–145)
Total Bilirubin: 0.3 mg/dL (ref 0.3–1.2)
Total Protein: 6.5 g/dL (ref 6.5–8.1)

## 2023-07-31 LAB — CBC WITH DIFFERENTIAL (CANCER CENTER ONLY)
Abs Immature Granulocytes: 0.05 10*3/uL (ref 0.00–0.07)
Basophils Absolute: 0 10*3/uL (ref 0.0–0.1)
Basophils Relative: 1 %
Eosinophils Absolute: 0 10*3/uL (ref 0.0–0.5)
Eosinophils Relative: 0 %
HCT: 31.4 % — ABNORMAL LOW (ref 36.0–46.0)
Hemoglobin: 10.3 g/dL — ABNORMAL LOW (ref 12.0–15.0)
Immature Granulocytes: 1 %
Lymphocytes Relative: 42 %
Lymphs Abs: 2.1 10*3/uL (ref 0.7–4.0)
MCH: 36 pg — ABNORMAL HIGH (ref 26.0–34.0)
MCHC: 32.8 g/dL (ref 30.0–36.0)
MCV: 109.8 fL — ABNORMAL HIGH (ref 80.0–100.0)
Monocytes Absolute: 0.8 10*3/uL (ref 0.1–1.0)
Monocytes Relative: 15 %
Neutro Abs: 2 10*3/uL (ref 1.7–7.7)
Neutrophils Relative %: 41 %
Platelet Count: 119 10*3/uL — ABNORMAL LOW (ref 150–400)
RBC: 2.86 MIL/uL — ABNORMAL LOW (ref 3.87–5.11)
RDW: 20.1 % — ABNORMAL HIGH (ref 11.5–15.5)
WBC Count: 5 10*3/uL (ref 4.0–10.5)
nRBC: 0 % (ref 0.0–0.2)

## 2023-07-31 LAB — MAGNESIUM: Magnesium: 1.3 mg/dL — ABNORMAL LOW (ref 1.7–2.4)

## 2023-07-31 MED ORDER — SODIUM CHLORIDE 0.9 % IV SOLN
719.0000 mg | Freq: Once | INTRAVENOUS | Status: AC
  Administered 2023-07-31: 720 mg via INTRAVENOUS
  Filled 2023-07-31: qty 72

## 2023-07-31 MED ORDER — CLONIDINE 0.3 MG/24HR TD PTWK: 0.3000 mg | MEDICATED_PATCH | TRANSDERMAL | 3 refills | Status: DC

## 2023-07-31 MED ORDER — DOXORUBICIN HCL LIPOSOMAL CHEMO INJECTION 2 MG/ML
30.0000 mg/m2 | Freq: Once | INTRAVENOUS | Status: AC
  Administered 2023-07-31: 60 mg via INTRAVENOUS
  Filled 2023-07-31: qty 20

## 2023-07-31 MED ORDER — DEXTROSE 5 % IV SOLN
Freq: Once | INTRAVENOUS | Status: AC
  Filled 2023-07-31: qty 250

## 2023-07-31 MED ORDER — SLOW MAGNESIUM/CALCIUM 70-117 MG PO TBEC: 1.0000 | DELAYED_RELEASE_TABLET | Freq: Two times a day (BID) | ORAL | 1 refills | Status: DC

## 2023-07-31 MED ORDER — CLONIDINE HCL 0.1 MG PO TABS: 0.1000 mg | ORAL_TABLET | Freq: Every day | ORAL | 1 refills | Status: DC

## 2023-07-31 MED ORDER — PALONOSETRON HCL INJECTION 0.25 MG/5ML
0.2500 mg | Freq: Once | INTRAVENOUS | Status: AC
  Administered 2023-07-31: 0.25 mg via INTRAVENOUS
  Filled 2023-07-31: qty 5

## 2023-07-31 MED ORDER — RIVAROXABAN 20 MG PO TABS: 20.0000 mg | ORAL_TABLET | Freq: Every day | ORAL | 1 refills | Status: DC

## 2023-07-31 MED ORDER — PROCHLORPERAZINE MALEATE 10 MG PO TABS: 10.0000 mg | ORAL_TABLET | Freq: Four times a day (QID) | ORAL | 3 refills | Status: DC | PRN

## 2023-07-31 MED ORDER — TRAZODONE HCL 150 MG PO TABS: 150.0000 mg | ORAL_TABLET | Freq: Every evening | ORAL | 1 refills | Status: DC | PRN

## 2023-07-31 MED ORDER — SODIUM CHLORIDE 0.9 % IV SOLN
150.0000 mg | Freq: Once | INTRAVENOUS | Status: AC
  Administered 2023-07-31: 150 mg via INTRAVENOUS
  Filled 2023-07-31: qty 150

## 2023-07-31 MED ORDER — FAMOTIDINE IN NACL 20-0.9 MG/50ML-% IV SOLN
20.0000 mg | Freq: Once | INTRAVENOUS | Status: AC
  Administered 2023-07-31: 20 mg via INTRAVENOUS
  Filled 2023-07-31: qty 50

## 2023-07-31 MED ORDER — HEPARIN SOD (PORK) LOCK FLUSH 100 UNIT/ML IV SOLN
500.0000 [IU] | Freq: Once | INTRAVENOUS | Status: AC | PRN
  Administered 2023-07-31: 500 [IU]
  Filled 2023-07-31: qty 5

## 2023-07-31 MED ORDER — ONDANSETRON 8 MG PO TBDP: 8.0000 mg | ORAL_TABLET | Freq: Three times a day (TID) | ORAL | 3 refills | Status: DC | PRN

## 2023-07-31 MED ORDER — UDENYCA 6 MG/0.6ML ~~LOC~~ SOAJ: 6.0000 mg | SUBCUTANEOUS | 0 refills | Status: DC

## 2023-07-31 MED ORDER — SODIUM CHLORIDE 0.9 % IV SOLN
10.0000 mg | Freq: Once | INTRAVENOUS | Status: AC
  Administered 2023-07-31: 10 mg via INTRAVENOUS
  Filled 2023-07-31: qty 10

## 2023-07-31 MED ORDER — DIPHENHYDRAMINE HCL 50 MG/ML IJ SOLN
50.0000 mg | Freq: Once | INTRAMUSCULAR | Status: AC
  Administered 2023-07-31: 50 mg via INTRAVENOUS
  Filled 2023-07-31: qty 1

## 2023-07-31 NOTE — Telephone Encounter (Signed)
Prescription sent to New pharmacy/ as pt has new insurance  for udenyca injections at home.

## 2023-07-31 NOTE — Progress Notes (Signed)
CT results.  Rx's pending, needs sent to new mail order.

## 2023-07-31 NOTE — Assessment & Plan Note (Addendum)
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 APRIL 4th 2024-recurrent peritoneal carcinomatosis.  No significant ascites.  No evidence of any bowel obstruction.  Last carboplatin-taxol-April 2021. Currently on with Doxil-carboplatin every 4 weeks. APRIL 2024- 2D echo- 55-60%. Post 3 cycles- 7/31-CT CAP- Pending; ca 125- improving.   #  proceed with cycle # 4 Doxil-carboplatin ; tumor markers-improving. CBC CMP are reviewed; undeyca at home.  # Gum bleeding-grade 1 through 2 /on Xarelto -improved currently continue salt/backing soda rinses. Stable.   # Nausea/vomiting- G-1-2-continue Zofran and Compazine- Stable. .  # constipation- miralax prn- Stable.   # Hypocalcemia-pending-Vit D levels; Hypokalemia- severe -3.1 -potassium.  Continue  K-Dur twice daily and also 20 of  KCL today.Add slow Mag.   # Ongoing MSK joints-February 2024 -bone scan negative for malignancy.  On oxycodone /Dr. Borders-  stable.   # JAN 2024- Brain MRI with and without contrast-nonocclusive dural venous sinus thrombosis noted-  Currently on xarelto.  Stable.     # PN G-2/back pain [awaiting steroid injection]- on Lyrica 50 mg TID/Cymbalta- Stable.   # Anxiety/Insomnia--social stressors-continue Cymbalta [at 20 mg/day]; continue trazadone 150 mg qhs- see  Stable.  # Hot flashes: on Clonidine patch- on Friday weekly; and clonidine pill 0.1 qhs.  Stable.   #IV access/Mediport-currently s/p TPA- port flush.   Will mychart CT results # DISPOSITION:  # chemo today;  # cancel injection tomorrow # follow up in  4 weeks- MD; labs- cbc/cmp; ca-125; Doxil- carbo; KCL 20 meq -  Dr.B

## 2023-07-31 NOTE — Telephone Encounter (Signed)
Marcelino Duster- do I need to do anything for this PA?
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
## 2023-07-31 NOTE — Patient Instructions (Signed)
[place] [facility] AT [facility]  Discharge Instructions: Thank you for choosing [facility] to provide your oncology and hematology care.  If you have a lab appointment with the Cancer Center, please go directly to the Cancer Center and check in at the registration area.  Wear comfortable clothing and clothing appropriate for easy access to any Portacath or PICC line.   We strive to give you quality time with your provider. You may need to reschedule your appointment if you arrive late (15 or more minutes).  Arriving late affects you and other patients whose appointments are after yours.  Also, if you miss three or more appointments without notifying the office, you may be dismissed from the clinic at the provider's discretion.      For prescription refill requests, have your pharmacy contact our office and allow 72 hours for refills to be completed.    Today you received the following chemotherapy and/or immunotherapy agents Doxil & Paraplatin      To help prevent nausea and vomiting after your treatment, we encourage you to take your nausea medication as directed.  BELOW ARE SYMPTOMS THAT SHOULD BE REPORTED IMMEDIATELY: *FEVER GREATER THAN 100.4 F (38 C) OR HIGHER *CHILLS OR SWEATING *NAUSEA AND VOMITING THAT IS NOT CONTROLLED WITH YOUR NAUSEA MEDICATION *UNUSUAL SHORTNESS OF BREATH *UNUSUAL BRUISING OR BLEEDING *URINARY PROBLEMS (pain or burning when urinating, or frequent urination) *BOWEL PROBLEMS (unusual diarrhea, constipation, pain near the anus) TENDERNESS IN MOUTH AND THROAT WITH OR WITHOUT PRESENCE OF ULCERS (sore throat, sores in mouth, or a toothache) UNUSUAL RASH, SWELLING OR PAIN  UNUSUAL VAGINAL DISCHARGE OR ITCHING   Items with * indicate a potential emergency and should be followed up as soon as possible or go to the Emergency Department if any problems should occur.  Please show the CHEMOTHERAPY ALERT CARD or IMMUNOTHERAPY ALERT CARD at  check-in to the Emergency Department and triage nurse.  Should you have questions after your visit or need to cancel or reschedule your appointment, please contact [place] [facility]  [phone] and follow the prompts.  Office hours are 8:00 a.m. to 4:30 p.m. Monday - Friday. Please note that voicemails left after 4:00 p.m. may not be returned until the following business day.  We are closed weekends and major holidays. You have access to a nurse at all times for urgent questions. Please call the main number to the clinic [phone] and follow the prompts.  For any non-urgent questions, you may also contact your provider using MyChart. We now offer e-Visits for anyone 56 and older to request care online for non-urgent symptoms. For details visit [email or web address].   Also download the MyChart app! Go to the app store, search "MyChart", open the app, select [place], and log in with your MyChart username and password.

## 2023-08-01 ENCOUNTER — Encounter: Payer: Self-pay | Admitting: Internal Medicine

## 2023-08-01 ENCOUNTER — Other Ambulatory Visit: Payer: Self-pay | Admitting: Family Medicine

## 2023-08-01 DIAGNOSIS — M5136 Other intervertebral disc degeneration, lumbar region: Secondary | ICD-10-CM

## 2023-08-01 DIAGNOSIS — K219 Gastro-esophageal reflux disease without esophagitis: Secondary | ICD-10-CM

## 2023-08-01 DIAGNOSIS — I700 Atherosclerosis of aorta: Secondary | ICD-10-CM

## 2023-08-01 DIAGNOSIS — F331 Major depressive disorder, recurrent, moderate: Secondary | ICD-10-CM

## 2023-08-01 NOTE — Telephone Encounter (Unsigned)
Copied from CRM [id number]. Topic: General - Other >> Aug 01, 2023 10:16 AM Everette C wrote: Reason for CRM: Medication Refill - Medication: omeprazole (PRILOSEC) 20 MG capsule [[id number]]  buPROPion (WELLBUTRIN XL) 300 MG 24 hr tablet [[id number]]  DULoxetine (CYMBALTA) 60 MG capsule [[id number]]  rosuvastatin (CRESTOR) 5 MG tablet [[id number]]  cariprazine (VRAYLAR) 1.5 MG capsule [[id number]] - there will be a contingent prior authorization required upon prescription, if refilled   Has the patient contacted their pharmacy? Yes.   (Agent: If no, request that the patient contact the pharmacy for the refill. If patient does not wish to contact the pharmacy document the reason why and proceed with request.) (Agent: If yes, when and what did the pharmacy advise?)  Preferred Pharmacy (with phone number or street name): [facility] - [place], [place] - [address] [place] [address] Phone: [phone] Fax: [phone] Hours: Not open 24 hours   Has the patient been seen for an appointment in the last year OR does the patient have an upcoming appointment? Yes.    Agent: Please be advised that RX refills may take up to 3 business days. We ask that you follow-up with your pharmacy.

## 2023-08-02 ENCOUNTER — Inpatient Hospital Stay: Payer: Medicare HMO

## 2023-08-02 MED ORDER — ROSUVASTATIN CALCIUM 5 MG PO TABS: ORAL_TABLET | ORAL | 0 refills | Status: DC

## 2023-08-02 MED ORDER — BUPROPION HCL ER (XL) 300 MG PO TB24: 300.0000 mg | ORAL_TABLET | Freq: Every day | ORAL | 0 refills | Status: DC

## 2023-08-02 MED ORDER — OMEPRAZOLE 20 MG PO CPDR
DELAYED_RELEASE_CAPSULE | ORAL | 0 refills | Status: DC
Start: 2023-08-02 — End: 2023-08-09

## 2023-08-02 MED ORDER — CARIPRAZINE HCL 1.5 MG PO CAPS: 1.5000 mg | ORAL_CAPSULE | Freq: Every day | ORAL | 0 refills | Status: DC

## 2023-08-02 MED ORDER — DULOXETINE HCL 60 MG PO CPEP
60.0000 mg | ORAL_CAPSULE | Freq: Every day | ORAL | 0 refills | Status: DC
Start: 2023-08-02 — End: 2023-08-09

## 2023-08-02 NOTE — Telephone Encounter (Signed)
Future OV scheduled 08/10/23.  Requested Prescriptions  Pending Prescriptions Disp Refills   omeprazole (PRILOSEC) 20 MG capsule 90 capsule 0    Sig: TAKE 1 CAPSULE(20 MG) BY MOUTH DAILY     Gastroenterology: Proton Pump Inhibitors Passed - 08/01/2023 11:33 AM      Passed - Valid encounter within last 12 months    Recent Outpatient Visits           6 months ago Hospital discharge follow-up   [name], MD   7 months ago Moderate episode of recurrent major depressive disorder [facility])   [place] [facility] [place], Danna Hefty, MD   10 months ago Moderate episode of recurrent major depressive disorder [facility])   [facility] [place], Danna Hefty, MD   12 months ago Moderate episode of recurrent major depressive disorder [facility])   [place] [facility] Danelle Berry, PA-C   1 year ago Moderate episode of recurrent major depressive disorder [facility])   [place] [facility] Danelle Berry, PA-C       Future Appointments             In 1 week [name], MD [facility]             buPROPion (WELLBUTRIN XL) 300 MG 24 hr tablet 90 tablet 0    Sig: Take 1 tablet (300 mg total) by mouth daily.     Psychiatry: Antidepressants - bupropion Failed - 08/01/2023 11:33 AM      Failed - Valid encounter within last 6 months    Recent Outpatient Visits           6 months ago Hospital discharge follow-up   [name], MD   7 months ago Moderate episode of recurrent major depressive disorder [facility])   [place] [facility] [name], MD   10 months ago Moderate episode of recurrent major depressive disorder [facility])   [place] [name], MD   12 months ago Moderate episode of recurrent major depressive disorder [facility])   [place] [facility]  Danelle Berry, PA-C   1 year ago Moderate episode of recurrent major depressive disorder [facility])   [place] [facility] Danelle Berry, PA-C       Future Appointments             In 1 week [name], MD [facility]            Passed - Cr in normal range and within 360 days    Creatinine  Date Value Ref Range Status  07/31/2023 0.87 0.44 - 1.00 mg/dL Final   Creat  Date Value Ref Range Status  10/25/2019 0.79 0.50 - 1.10 mg/dL Final   Creatinine, Urine  Date Value Ref Range Status  08/09/2022 310 mg/dL Final         Passed - AST in normal range and within 360 days    AST  Date Value Ref Range Status  07/31/2023 19 15 - 41 U/L Final         Passed - ALT in normal range and within 360 days    ALT  Date Value Ref Range Status  07/31/2023 12 0 - 44 U/L Final         Passed - Completed PHQ-2 or PHQ-9 in the last 360 days  Passed - Last BP in normal range    BP Readings from Last 1 Encounters:  07/31/23 132/81          DULoxetine (CYMBALTA) 60 MG capsule 90 capsule 0    Sig: Take 1 capsule (60 mg total) by mouth daily.     Psychiatry: Antidepressants - SNRI - duloxetine Failed - 08/01/2023 11:33 AM      Failed - Valid encounter within last 6 months    Recent Outpatient Visits           6 months ago Hospital discharge follow-up   [place] [facility] [name], MD   7 months ago Moderate episode of recurrent major depressive disorder [facility])   [place] [facility] [name], MD   10 months ago Moderate episode of recurrent major depressive disorder [facility])   [place] [facility] [name], MD   12 months ago Moderate episode of recurrent major depressive disorder [facility])   [place] [facility] Danelle Berry, PA-C   1 year ago Moderate episode of recurrent major depressive disorder [facility])   [place] [facility] Danelle Berry, PA-C       Future Appointments             In 1 week [name], MD [facility]            Passed - Cr in normal range and within 360 days    Creatinine  Date Value Ref Range Status  07/31/2023 0.87 0.44 - 1.00 mg/dL Final   Creat  Date Value Ref Range Status  10/25/2019 0.79 0.50 - 1.10 mg/dL Final   Creatinine, Urine  Date Value Ref Range Status  08/09/2022 310 mg/dL Final         Passed - eGFR is 30 or above and within 360 days    GFR, Est African American  Date Value Ref Range Status  10/25/2019 103 > OR = 60 mL/min/1.12m2 Final   GFR calc Af Amer  Date Value Ref Range Status  09/08/2020 >60 >60 mL/min Final   GFR, Est Non African American  Date Value Ref Range Status  10/25/2019 89 > OR = 60 mL/min/1.4m2 Final   GFR, Estimated  Date Value Ref Range Status  07/31/2023 >60 >60 mL/min Final    Comment:    (NOTE) Calculated using the CKD-EPI Creatinine Equation (2021)    GFR  Date Value Ref Range Status  12/23/2016 87.54 >60.00 mL/min Final         Passed - Completed PHQ-2 or PHQ-9 in the last 360 days      Passed - Last BP in normal range    BP Readings from Last 1 Encounters:  07/31/23 132/81          cariprazine (VRAYLAR) 1.5 MG capsule 90 capsule 0    Sig: Take 1 capsule (1.5 mg total) by mouth daily.     Off-Protocol Failed - 08/01/2023 11:33 AM      Failed - Medication not assigned to a protocol, review manually.      Passed - Valid encounter within last 12 months    Recent Outpatient Visits           6 months ago Hospital discharge follow-up   [name], MD   7 months ago Moderate episode of recurrent major depressive disorder [facility])   [facility] [name], MD  10 months ago Moderate episode of recurrent major depressive disorder [facility])   [place] [facility] [place], Danna Hefty, MD    12 months ago Moderate episode of recurrent major depressive disorder [facility])   [place] [facility] Danelle Berry, PA-C   1 year ago Moderate episode of recurrent major depressive disorder [facility])   [place] [facility] Danelle Berry, PA-C       Future Appointments             In 1 week [name], MD [facility]             rosuvastatin (CRESTOR) 5 MG tablet 90 tablet 0    Sig: TAKE 1 TABLET BY MOUTH ONCE DAILY. IN PLACE OF ATORVASTATIN     Cardiovascular:  Antilipid - Statins 2 Failed - 08/01/2023 11:33 AM      Failed - Lipid Panel in normal range within the last 12 months    Cholesterol, Total  Date Value Ref Range Status  05/17/2022 249 (H) 100 - 199 mg/dL Final   LDL Cholesterol (Calc)  Date Value Ref Range Status  08/31/2021 156 (H) mg/dL (calc) Final    Comment:    Reference range: <100 . Desirable range <100 mg/dL for primary prevention;   <70 mg/dL for patients with CHD or diabetic patients  with > or = 2 CHD risk factors. Marland Kitchen LDL-C is now calculated using the Martin-Hopkins  calculation, which is a validated novel method providing  better accuracy than the Friedewald equation in the  estimation of LDL-C.  Horald Pollen et al. Lenox Ahr. 4034;742(59): 2061-2068  (http://education.[employer].com/faq/FAQ164)    LDL Chol Calc (NIH)  Date Value Ref Range Status  05/17/2022 153 (H) 0 - 99 mg/dL Final   HDL  Date Value Ref Range Status  05/17/2022 58 >39 mg/dL Final   Triglycerides  Date Value Ref Range Status  05/17/2022 211 (H) 0 - 149 mg/dL Final         Passed - Cr in normal range and within 360 days    Creatinine  Date Value Ref Range Status  07/31/2023 0.87 0.44 - 1.00 mg/dL Final   Creat  Date Value Ref Range Status  10/25/2019 0.79 0.50 - 1.10 mg/dL Final   Creatinine, Urine  Date Value Ref Range Status  08/09/2022 310 mg/dL Final         Passed - Patient is not pregnant       Passed - Valid encounter within last 12 months    Recent Outpatient Visits           6 months ago Hospital discharge follow-up   [name], MD   7 months ago Moderate episode of recurrent major depressive disorder [facility])   [place] [facility] [place], Danna Hefty, MD   10 months ago Moderate episode of recurrent major depressive disorder [facility])   [place] [facility] [place], Danna Hefty, MD   12 months ago Moderate episode of recurrent major depressive disorder [facility])   [place] [facility] Danelle Berry, PA-C   1 year ago Moderate episode of recurrent major depressive disorder [facility])   [facility] [place] [facility] Dan[facility] Berry, PA-C       Future Appointments             In 1 week [name], MD [facility]

## 2023-08-03 ENCOUNTER — Encounter: Payer: Self-pay | Admitting: Internal Medicine

## 2023-08-03 ENCOUNTER — Telehealth: Payer: Self-pay | Admitting: *Deleted

## 2023-08-03 MED ORDER — SLOW MAGNESIUM/CALCIUM 70-117 MG PO TBEC: 1.0000 | DELAYED_RELEASE_TABLET | Freq: Two times a day (BID) | ORAL | 1 refills | Status: DC

## 2023-08-03 NOTE — Telephone Encounter (Signed)
Magnesium sent to [facility] per pt request.

## 2023-08-04 ENCOUNTER — Other Ambulatory Visit: Payer: Self-pay | Admitting: Internal Medicine

## 2023-08-04 ENCOUNTER — Encounter: Payer: Self-pay | Admitting: Nurse Practitioner

## 2023-08-04 NOTE — Progress Notes (Signed)
Imaging and labs were reviewed by Dr Sonia Side. She recommends clinical evaluation. I've asked scheduling to reach out to patient for appointment.

## 2023-08-07 ENCOUNTER — Encounter: Payer: Self-pay | Admitting: Internal Medicine

## 2023-08-08 ENCOUNTER — Encounter: Payer: Self-pay | Admitting: Internal Medicine

## 2023-08-09 ENCOUNTER — Ambulatory Visit: Payer: Medicare HMO | Admitting: Family Medicine

## 2023-08-09 ENCOUNTER — Encounter: Payer: Self-pay | Admitting: Family Medicine

## 2023-08-09 ENCOUNTER — Inpatient Hospital Stay (HOSPITAL_BASED_OUTPATIENT_CLINIC_OR_DEPARTMENT_OTHER): Payer: Medicare HMO | Admitting: Obstetrics and Gynecology

## 2023-08-09 VITALS — BP 123/77 | HR 80 | Temp 97.6°F | Resp 20 | Wt 220.0 lb

## 2023-08-09 VITALS — BP 124/76 | HR 95 | Resp 16 | Ht 63.0 in | Wt 219.0 lb

## 2023-08-09 DIAGNOSIS — I700 Atherosclerosis of aorta: Secondary | ICD-10-CM

## 2023-08-09 DIAGNOSIS — M5136 Other intervertebral disc degeneration, lumbar region: Secondary | ICD-10-CM

## 2023-08-09 DIAGNOSIS — C482 Malignant neoplasm of peritoneum, unspecified: Secondary | ICD-10-CM

## 2023-08-09 DIAGNOSIS — G43109 Migraine with aura, not intractable, without status migrainosus: Secondary | ICD-10-CM

## 2023-08-09 DIAGNOSIS — F331 Major depressive disorder, recurrent, moderate: Secondary | ICD-10-CM

## 2023-08-09 DIAGNOSIS — Z90722 Acquired absence of ovaries, bilateral: Secondary | ICD-10-CM | POA: Diagnosis not present

## 2023-08-09 DIAGNOSIS — J410 Simple chronic bronchitis: Secondary | ICD-10-CM | POA: Diagnosis not present

## 2023-08-09 DIAGNOSIS — R112 Nausea with vomiting, unspecified: Secondary | ICD-10-CM | POA: Diagnosis not present

## 2023-08-09 DIAGNOSIS — M51369 Other intervertebral disc degeneration, lumbar region without mention of lumbar back pain or lower extremity pain: Secondary | ICD-10-CM

## 2023-08-09 DIAGNOSIS — R739 Hyperglycemia, unspecified: Secondary | ICD-10-CM | POA: Diagnosis not present

## 2023-08-09 DIAGNOSIS — R5383 Other fatigue: Secondary | ICD-10-CM | POA: Diagnosis not present

## 2023-08-09 DIAGNOSIS — K219 Gastro-esophageal reflux disease without esophagitis: Secondary | ICD-10-CM | POA: Diagnosis not present

## 2023-08-09 DIAGNOSIS — G620 Drug-induced polyneuropathy: Secondary | ICD-10-CM | POA: Diagnosis not present

## 2023-08-09 DIAGNOSIS — M2550 Pain in unspecified joint: Secondary | ICD-10-CM | POA: Diagnosis not present

## 2023-08-09 DIAGNOSIS — E876 Hypokalemia: Secondary | ICD-10-CM | POA: Diagnosis not present

## 2023-08-09 DIAGNOSIS — T451X5A Adverse effect of antineoplastic and immunosuppressive drugs, initial encounter: Secondary | ICD-10-CM

## 2023-08-09 DIAGNOSIS — M791 Myalgia, unspecified site: Secondary | ICD-10-CM | POA: Diagnosis not present

## 2023-08-09 DIAGNOSIS — Z5111 Encounter for antineoplastic chemotherapy: Secondary | ICD-10-CM | POA: Diagnosis not present

## 2023-08-09 DIAGNOSIS — Z90710 Acquired absence of both cervix and uterus: Secondary | ICD-10-CM | POA: Diagnosis not present

## 2023-08-09 MED ORDER — CARIPRAZINE HCL 1.5 MG PO CAPS
1.5000 mg | ORAL_CAPSULE | Freq: Every day | ORAL | 1 refills | Status: DC
Start: 2023-08-09 — End: 2024-11-11

## 2023-08-09 MED ORDER — SUMATRIPTAN SUCCINATE 100 MG PO TABS
100.0000 mg | ORAL_TABLET | ORAL | 2 refills | Status: DC | PRN
Start: 2023-08-09 — End: 2024-11-11

## 2023-08-09 MED ORDER — DULOXETINE HCL 60 MG PO CPEP
60.0000 mg | ORAL_CAPSULE | Freq: Every day | ORAL | 1 refills | Status: AC
Start: 2023-08-09 — End: ?

## 2023-08-09 MED ORDER — TIOTROPIUM BROMIDE MONOHYDRATE 18 MCG IN CAPS
18.0000 ug | ORAL_CAPSULE | Freq: Every day | RESPIRATORY_TRACT | 1 refills | Status: DC
Start: 2023-08-09 — End: 2023-08-09

## 2023-08-09 MED ORDER — OMEPRAZOLE 20 MG PO CPDR
DELAYED_RELEASE_CAPSULE | ORAL | 1 refills | Status: AC
Start: 2023-08-09 — End: ?

## 2023-08-09 MED ORDER — BUPROPION HCL ER (XL) 300 MG PO TB24
300.0000 mg | ORAL_TABLET | Freq: Every day | ORAL | 1 refills | Status: DC
Start: 2023-08-09 — End: 2024-11-11

## 2023-08-09 MED ORDER — SPIRIVA RESPIMAT 2.5 MCG/ACT IN AERS: 2.0000 | INHALATION_SPRAY | Freq: Every day | RESPIRATORY_TRACT | 1 refills | Status: DC

## 2023-08-09 MED ORDER — ROSUVASTATIN CALCIUM 5 MG PO TABS
ORAL_TABLET | ORAL | 1 refills | Status: AC
Start: 2023-08-09 — End: ?

## 2023-08-09 NOTE — Progress Notes (Signed)
Name: [name]   MRN: [id number]    DOB: March 24, 1971   Date:08/09/2023       Progress Note  Subjective  Chief Complaint  Follow Up  HPI  History of HTN/Tachycardia:  BP and heart rate are back to normal, occasionally has tachycardia, she is off metoprolol now   Chronic back pain: still under the care of ortho and takes Lyrica and flexeril and symptoms are controlled at this time  She has ablation earlier this year and pain has improved since    Morbid obesity:she was diagnosed with carcinomatosis  in 11/2019 at a weight of 259 lbs. It went up to 271 lbs 08/2021, she was seen by [facility] August 2023 weight was 242 lbs. She was on Ozempic, weight went down to  234 lbs. Last visit we changed to [facility] and lost another 6 lbs.  Weight is now 220 lbs. 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 Joice Lofts. Night sweats are still present  on Clonidine 0.3 mg patch and  clonidine 0.1 mg qhs and symptoms still not controlled. 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 She takes Duloxetine and Lyrica and seems to be controlling symptoms of neuropathy. She is not responding to current treatment , having more pain and nausea, she is feeling discouraged and upset. She sees Dr. Leonard Schwartz and also palliative care provider. Discussed therapy and to try to do one thing that she love daily. She said she will try to get a coloring book   Insomnia: she is off  Lunesta 2 mg because it was helping her fall but not stay asleep. She is now taking trazodone and doing better with higher dose    Cardiomegaly: on CT scan, however normal echo   Smoker's cough: trying to quit now, she is currently on Spiriva but does not like the capsules so we will switch to  Respimat    Migraines: she stopped topamax, taking imitrex prn, she denies recent episodes of migraine . She has episodes once or twice a month and resolves with Imitrex.  She is having dull headaches. Unchanged    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, she is back on Duloxetine since diagnosed with cancer Dec 2020. She is on Duloxetine and Vraylar. She is now in an apartment . She states her daughter that has a history of addiction is living with her and does not help around the house and frustrates her, also having recurrence of carcinomatosis   Dyslipidemia: LDL was very high and has atherosclerosis of aorta , taking statin therapy and denies side effects  Pre-diabetes: A1C was  6 % on mounjarno but off medication since no longer covered for pre diabetes, we will recheck labs today. 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   Dural sinus thrombosis 01/10/2023   DDD (degenerative disc disease), lumbar 09/28/2022   Intertrigo 09/28/2022   Metabolic syndrome 09/28/2022   Moderate episode of recurrent major depressive disorder (HCC) 06/17/2022   Smokers' cough (HCC) 05/17/2022   Dyslipidemia 05/17/2022  Elevated TSH 05/17/2022   Pre-diabetes 05/17/2022   Ventral hernia without obstruction or gangrene 04/26/2021   Malignant ascites 12/22/2019   Pleural effusion on right 12/22/2019   Tachycardia 12/22/2019   Primary peritoneal carcinomatosis (HCC) 12/16/2019   Goals of care, counseling/discussion 12/16/2019   Erythrocytosis 11/12/2019   Morbid obesity (HCC) 07/07/2018   BRCA1 gene mutation positive 06/18/2018   Fever blister 05/17/2018   Family history of breast cancer 05/08/2018   Family history of colon cancer in mother 05/08/2018   Migraine with aura and without status migrainosus 04/18/2018    Mild recurrent major depression (HCC) 01/20/2016   GERD without esophagitis 01/20/2016   Osteoarthritis, multiple sites 01/20/2016    Past Surgical History:  Procedure Laterality Date   ABDOMINAL HYSTERECTOMY  03/2020   APPENDECTOMY     LSC but "ruptured when they did the surgery"   BREAST BIOPSY Left 01/04/2021   MRI BX   CESAREAN SECTION     CYSTOSCOPY N/A 04/08/2020   Procedure: CYSTOSCOPY;  Surgeon: Artelia Laroche, MD;  [name];  Service: Gynecology;  [name];   INSERTION OF MESH N/A 04/26/2021   Procedure: INSERTION OF MESH;  Surgeon: Earline Mayotte, MD;  [name];  Service: General;  [name];   IR THORACENTESIS ASP PLEURAL SPACE W/IMG GUIDE  12/06/2019   IUD REMOVAL N/A 04/08/2020   Procedure: INTRAUTERINE DEVICE (IUD) REMOVAL;  Surgeon: Artelia Laroche, MD;  [name];  Service: Gynecology;  [name];   PARACENTESIS     x6   PORTA CATH INSERTION N/A 04/23/2020   Procedure: PORTA CATH INSERTION;  Surgeon: Annice Needy, MD;  Location: [facility];  Service: Cardiovascular;  [name];   TUBAL LIGATION     at time of CSxn   VENTRAL HERNIA REPAIR N/A 04/26/2021   Procedure: HERNIA REPAIR VENTRAL ADULT;  Surgeon: Earline Mayotte, MD;  [name];  Service: General;  [name];  need RNFA for the case   WRIST SURGERY Left 11/21/2016   plates and screws inserted    Family History  Adopted: Yes  Problem Relation Age of Onset   Lung cancer Father        deceased 38   Breast cancer Mother 64       currently 2   Colon cancer Mother    ADD / ADHD Son    ADD / ADHD Son    Early death Maternal Aunt    Breast cancer Maternal Aunt 34       deceased 33   Breast cancer Maternal Grandmother    Depression Daughter    Depression Daughter    Prostate cancer Paternal Uncle    Stroke Paternal Uncle    Leukemia Paternal Aunt    Breast cancer Paternal Grandmother    Cancer Maternal  Uncle     Social History   Tobacco Use   Smoking status: Every Day    Current packs/day: 1.00    Average packs/day: 1 pack/day for 30.0 years (30.0 ttl pk-yrs)    Types: Cigarettes   Smokeless tobacco: Never  Substance Use Topics   Alcohol use: Not Currently    Alcohol/week: 0.0 standard drinks of alcohol     Current Outpatient Medications:    cloNIDine (CATAPRES - DOSED IN MG/24 HR) 0.3 mg/24hr patch, Place 1 patch (0.3 mg total) onto the skin once a week. Friday, Disp: 12 patch, Rfl: 3   cloNIDine (CATAPRES) 0.1 MG tablet, Take  1 tablet (0.1 mg total) by mouth at bedtime., Disp: 90 tablet, Rfl: 1   cyclobenzaprine (FLEXERIL) 5 MG tablet, Take 5 mg by mouth 3 (three) times daily as needed for muscle spasms., Disp: , Rfl:    docusate sodium (COLACE) 100 MG capsule, Take 100 mg by mouth 2 (two) times daily., Disp: , Rfl:    LORazepam (ATIVAN) 0.5 MG tablet, Take 1 tablet (0.5 mg total) by mouth every 12 (twelve) hours as needed for anxiety., Disp: 60 tablet, Rfl: 0   Magnesium Cl-Calcium Carbonate (SLOW MAGNESIUM/CALCIUM) 70-117 MG TBEC, Take 1 tablet by mouth 2 (two) times daily., Disp: 180 tablet, Rfl: 1   meclizine (ANTIVERT) 25 MG tablet, 1 pill at night as needed; if dizziness not improved can take one pill every 8 hours as needed/tolerated., Disp: 30 tablet, Rfl: 0   Multiple Vitamin (MULTIVITAMIN WITH MINERALS) TABS tablet, Take 1 tablet by mouth daily., Disp: , Rfl:    nystatin (MYCOSTATIN/NYSTOP) powder, Apply 1 Application topically 3 (three) times daily., Disp: 60 g, Rfl: 0   ondansetron (ZOFRAN-ODT) 8 MG disintegrating tablet, Take 1 tablet (8 mg total) by mouth every 8 (eight) hours as needed for nausea or vomiting., Disp: 90 tablet, Rfl: 3   oxyCODONE (OXY IR/ROXICODONE) 5 MG immediate release tablet, Take 1 tablet (5 mg total) by mouth every 6 (six) hours as needed for severe pain., Disp: 75 tablet, Rfl: 0   Pegfilgrastim-cbqv (UDENYCA) 6 MG/0.6ML SOAJ, Inject 6 mg into the  skin every 28 (twenty-eight) days., Disp: 1.2 mL, Rfl: 0   polyethylene glycol powder (GLYCOLAX/MIRALAX) 17 GM/SCOOP powder, Take 0.5 Containers by mouth daily as needed for mild constipation or moderate constipation., Disp: , Rfl:    potassium chloride SA (KLOR-CON M) 20 MEQ tablet, Take 1 tablet (20 mEq total) by mouth daily., Disp: 90 tablet, Rfl: 0   predniSONE (DELTASONE) 10 MG tablet, Take 1 tablet (10 mg total) by mouth daily with breakfast., Disp: 90 tablet, Rfl: 0   pregabalin (LYRICA) 150 MG capsule, Take 150 mg by mouth 3 (three) times daily., Disp: , Rfl:    prochlorperazine (COMPAZINE) 10 MG tablet, Take 1 tablet (10 mg total) by mouth every 6 (six) hours as needed for nausea or vomiting., Disp: 40 tablet, Rfl: 3   rivaroxaban (XARELTO) 20 MG TABS tablet, Take 1 tablet (20 mg total) by mouth daily with supper., Disp: 90 tablet, Rfl: 1   senna (SENOKOT) 8.6 MG tablet, Take 1 tablet by mouth daily as needed for constipation., Disp: , Rfl:    traZODone (DESYREL) 150 MG tablet, Take 1 tablet (150 mg total) by mouth at bedtime as needed for sleep., Disp: 90 tablet, Rfl: 1   valACYclovir (VALTREX) 1000 MG tablet, Take 1 tablet (1,000 mg total) by mouth 2 (two) times daily as needed., Disp: 6 tablet, Rfl: 2   buPROPion (WELLBUTRIN XL) 300 MG 24 hr tablet, Take 1 tablet (300 mg total) by mouth daily., Disp: 90 tablet, Rfl: 1   cariprazine (VRAYLAR) 1.5 MG capsule, Take 1 capsule (1.5 mg total) by mouth daily., Disp: 90 capsule, Rfl: 1   DULoxetine (CYMBALTA) 60 MG capsule, Take 1 capsule (60 mg total) by mouth daily., Disp: 90 capsule, Rfl: 1   omeprazole (PRILOSEC) 20 MG capsule, TAKE 1 CAPSULE(20 MG) BY MOUTH DAILY, Disp: 90 capsule, Rfl: 1   rosuvastatin (CRESTOR) 5 MG tablet, TAKE 1 TABLET BY MOUTH ONCE DAILY. IN PLACE OF ATORVASTATIN, Disp: 90 tablet, Rfl: 1   SUMAtriptan (IMITREX) 100  MG tablet, Take 1 tablet (100 mg total) by mouth every 2 (two) hours as needed for migraine. May repeat in  2 hours if headache persists or recurs., Disp: 10 tablet, Rfl: 2   tiotropium (SPIRIVA HANDIHALER) 18 MCG inhalation capsule, Place 1 capsule (18 mcg total) into inhaler and inhale daily., Disp: 90 capsule, Rfl: 1 No current facility-administered medications for this visit.  Facility-Administered Medications Ordered in Other Visits:    heparin lock flush 100 unit/mL, 250 Units, Intracatheter, Once PRN, Earna Coder, MD   ondansetron [facility]) 4 MG/2ML injection, , , ,    sodium chloride flush (NS) 0.9 % injection 10 mL, 10 mL, Intravenous, Once, Donneta Romberg, Worthy Flank, MD  Allergies  Allergen Reactions   Hydroxyzine Hcl Other (See Comments)    Makes her loopy/jittery    I personally reviewed active problem list, medication list, allergies, family history, social history with the patient/caregiver today.   ROS  Constitutional: Negative for fever or significant  weight change.  Respiratory: Positive  for cough and shortness of breath.   Cardiovascular: Negative for chest pain or palpitations.  Gastrointestinal: positive for abdominal pain and nausea, no bowel changes.  Musculoskeletal: positive  for gait problem ( due to neuropathy )  or joint swelling.  Skin: Negative for rash.  Neurological: Negative for dizziness , positive for  headache.  No other specific complaints in a complete review of systems (except as listed in HPI above).   Objective  Vitals:   08/09/23 1115  BP: 124/76  Pulse: 95  Resp: 16  SpO2: 98%  Weight: 219 lb (99.3 kg)  Height: 5\' 3"  (1.6 m)    Body mass index is 38.79 kg/m.  Physical Exam  Constitutional: Patient appears well-developed and well-nourished. Obese  No distress.  HEENT: head atraumatic, normocephalic, pupils equal and reactive to light, neck supple Cardiovascular: Normal rate, regular rhythm and normal heart sounds.  No murmur heard. No BLE edema. Pulmonary/Chest: Effort normal and breath sounds normal. No respiratory  distress. Abdominal: Soft.  There is no tenderness. Psychiatric: Patient has a normal mood and affect. behavior is normal. Judgment and thought content normal.   PHQ2/9:    08/09/2023   11:15 AM 01/19/2023   11:11 AM 01/04/2023   11:47 AM 12/12/2022    4:32 PM 09/28/2022   11:06 AM  Depression screen PHQ 2/9  Decreased Interest 2 2 2  0 3  Down, Depressed, Hopeless 2 3 2 3 3   PHQ - 2 Score 4 5 4 3 6   Altered sleeping 3 3 3  0 2  Tired, decreased energy 3 2 2 3 3   Change in appetite 3 0 0 0   Feeling bad or failure about yourself  3 2 2 3 3   Trouble concentrating 3 2 2 3 2   Moving slowly or fidgety/restless 0 0 0 0   Suicidal thoughts 0 0 0 0   PHQ-9 Score 19 14 13 12 16   Difficult doing work/chores  Very difficult Somewhat difficult Somewhat difficult     phq 9 is positive   Fall Risk:    08/09/2023   11:14 AM 01/19/2023   11:02 AM 09/28/2022   10:57 AM 08/05/2022    9:18 AM 06/17/2022    2:31 PM  Fall Risk   Falls in the past year? 0 0 0 0 0  Number falls in past yr: 0  0 0 0  Injury with Fall? 0  0 0 0  Risk for fall due to :  No Fall Risks No Fall Risks No Fall Risks No Fall Risks No Fall Risks  Follow up Falls prevention discussed Falls prevention discussed;Education provided Falls prevention discussed Falls prevention discussed;Education provided Falls prevention discussed;Education provided      Functional Status Survey: Is the patient deaf or have difficulty hearing?: No Does the patient have difficulty seeing, even when wearing glasses/contacts?: Yes Does the patient have difficulty concentrating, remembering, or making decisions?: Yes Does the patient have difficulty walking or climbing stairs?: Yes Does the patient have difficulty dressing or bathing?: No Does the patient have difficulty doing errands alone such as visiting a doctor's office or shopping?: No    Assessment & Plan  1. Chemotherapy-induced neuropathy (HCC)  - Ambulatory referral to  Psychology  2. Smokers' cough (HCC)  We will change to respimat  3. Morbid obesity (HCC)  She is trying to eat healthier   4. Atherosclerosis of aorta (HCC)  - rosuvastatin (CRESTOR) 5 MG tablet; TAKE 1 TABLET BY MOUTH ONCE DAILY. IN PLACE OF ATORVASTATIN  Dispense: 90 tablet; Refill: 1 - Lipid panel  5. Moderate episode of recurrent major depressive disorder (HCC)  - buPROPion (WELLBUTRIN XL) 300 MG 24 hr tablet; Take 1 tablet (300 mg total) by mouth daily.  Dispense: 90 tablet; Refill: 1 - cariprazine (VRAYLAR) 1.5 MG capsule; Take 1 capsule (1.5 mg total) by mouth daily.  Dispense: 90 capsule; Refill: 1 - DULoxetine (CYMBALTA) 60 MG capsule; Take 1 capsule (60 mg total) by mouth daily.  Dispense: 90 capsule; Refill: 1 - Ambulatory referral to Psychology  6. Primary peritoneal carcinomatosis (HCC)  - Ambulatory referral to Psychology  7. Migraine with aura and without status migrainosus, not intractable  - SUMAtriptan (IMITREX) 100 MG tablet; Take 1 tablet (100 mg total) by mouth every 2 (two) hours as needed for migraine. May repeat in 2 hours if headache persists or recurs.  Dispense: 10 tablet; Refill: 2  8. DDD (degenerative disc disease), lumbar  - DULoxetine (CYMBALTA) 60 MG capsule; Take 1 capsule (60 mg total) by mouth daily.  Dispense: 90 capsule; Refill: 1  9. GERD without esophagitis  - omeprazole (PRILOSEC) 20 MG capsule; TAKE 1 CAPSULE(20 MG) BY MOUTH DAILY  Dispense: 90 capsule; Refill: 1  10. Hyperglycemia  - Hemoglobin A1c  11. Other fatigue  - B12 and Folate Panel - VITAMIN D 25 Hydroxy (Vit-D Deficiency, Fractures)

## 2023-08-09 NOTE — Progress Notes (Addendum)
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 with no obvious rashes or skin changes.  NEURO:  Nonfocal. Well oriented.  Appropriate affect.  Pelvic: exam chaperoned by RN. EGBUS: no lesions. Evidence of healing folliculitis bilateral perianal. Vagina: no lesions, discharge, or bleeding. Cervix: Uterus- surgically absent. BME: no palpable masses, smooth bilaterally. Rectovaginal: stool in rectum.   Lab Review Labs on site today Lab Results  Component Value Date   WBC 5.0 07/31/2023   HGB 10.3 (L) 07/31/2023   HCT 31.4 (L) 07/31/2023   MCV 109.8 (H) 07/31/2023   PLT 119 (L) 07/31/2023     Chemistry      Component Value Date/Time   NA 137 07/31/2023 0831   K 3.1 (L) 07/31/2023 0831   CL 106 07/31/2023 0831   CO2 24 07/31/2023 0831   BUN 10 07/31/2023 0831   CREATININE 0.87 07/31/2023 0831   CREATININE 0.79 10/25/2019 0000      Component Value Date/Time    CALCIUM 8.8 (L) 07/31/2023 0831   ALKPHOS 60 07/31/2023 0831   AST 19 07/31/2023 0831   ALT 12 07/31/2023 0831   BILITOT 0.3 07/31/2023 0831      Radiologic Imaging: As per interval history and HPI    Assessment:  [name] is a 52 y.o. 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 S/p 6 cycles of carbo-taxol completed 06/02/20 followed by bevacizumab and olaparib maintenance given brca1 mutation. 06/14/21- olaparib reduced to 100 mg BID. CT scan 12/28/20 showed response to therapy.    Diagnosed with umbilical trochar hernia and Dr [name] repaired this 04/26/21.    Jan 15th 2021- L UE SVTxarelto; [name]; [name]; mid May 2021-Xarelto 10 mg a day/prophylaxis.  12/23 stopped olaparib and avastin  JAN 2024- Brain MRI with and without contrast-nonocclusive dural venous sinus thrombosis noted-without any brain infarct or any metastatic disease to the brain.  Lovenox/ NOAK for long-term needs. HOLD avastin.   APRIL 2024- Recurrent carcinomatosis.  Doxil-carboplatin every 4 weeks with declining CA125 291 to 19 most recently  after 4 cycles.    No evidence of local disease today.   Recurrent incisional ventral hernia s/p repair with Dr. [name] on 04/26/21, but recurrence now however no plans to repair due to chronic anticoagulation. .    Medical co-morbidities complicating care:  Body mass index is 38.97 kg/m.  Plan:   Problem List Items  Addressed This Visit       Other   Primary peritoneal carcinomatosis (HCC) - Primary    Continue carbo/doxil chemotherapy with Dr. Donneta Romberg to complete 6-8 cycles and discussed this with him. She has a BRCA1 associated cancer and hopefully will have a more indolent course. Could consider olaparib maintenance again, but she did not tolerate well previously.    Continue anticoagulation for brain VTE per Dr Donneta Romberg.   BRCA1 related cancers- briefly discussed screening for other brca related cancers and screenings. Patient expresses desire for mastectomy in the future. We had previously discusssed 1 year post completion of therapy for gynecologic cancer. Mammograms up to date.  Her  four children have been tested and 3 are carriers and undergoing surveillance and one is having proph mastectomy.  She is adopted and found biological parents.  Mother and her female relatives have an extensive breast cancer history, but mother is unwilling to engage with patient regarding hereditary cancer risk.  Return to clinic for follow up in 3 months or sooner if symptomatic.    Leida Lauth, MD

## 2023-08-10 ENCOUNTER — Ambulatory Visit: Payer: Medicare Other | Admitting: Family Medicine

## 2023-08-10 LAB — B12 AND FOLATE PANEL
Folate: 19.8 ng/mL
Vitamin B-12: 1559 pg/mL — ABNORMAL HIGH (ref 200–1100)

## 2023-08-10 LAB — VITAMIN D 25 HYDROXY (VIT D DEFICIENCY, FRACTURES): Vit D, 25-Hydroxy: 39 ng/mL (ref 30–100)

## 2023-08-10 LAB — HEMOGLOBIN A1C
Hgb A1c MFr Bld: 5 %{Hb} (ref <5.7)
Mean Plasma Glucose: 97 mg/dL
eAG (mmol/L): 5.4 mmol/L

## 2023-08-10 LAB — LIPID PANEL
Cholesterol: 169 mg/dL (ref <200)
HDL: 58 mg/dL (ref >=50)
LDL Cholesterol (Calc): 84 mg/dL
Non-HDL Cholesterol (Calc): 111 mg/dL (ref <130)
Total CHOL/HDL Ratio: 2.9 (calc) (ref <5.0)
Triglycerides: 178 mg/dL — ABNORMAL HIGH (ref <150)

## 2023-08-11 ENCOUNTER — Encounter: Payer: Self-pay | Admitting: Internal Medicine

## 2023-08-14 ENCOUNTER — Other Ambulatory Visit: Payer: Self-pay | Admitting: Internal Medicine

## 2023-08-14 MED ORDER — OXYCODONE HCL 5 MG PO TABS: 5.0000 mg | ORAL_TABLET | Freq: Four times a day (QID) | ORAL | 0 refills | Status: DC | PRN

## 2023-08-21 ENCOUNTER — Telehealth: Payer: Self-pay | Admitting: *Deleted

## 2023-08-21 ENCOUNTER — Encounter: Payer: Self-pay | Admitting: Internal Medicine

## 2023-08-21 NOTE — Telephone Encounter (Signed)
Patient called reporting that her head does not feel right. It is pulsating when she lies down like how you hear your heart beating, and she has a feeling of pressure as well as a headache. She reports that this is how it felt when she had the blood clots in her brain. Please advise

## 2023-08-21 NOTE — Telephone Encounter (Signed)
I spoke with patient earlier and advised that she go to ER per APP advice. She stated that she will go to ER in [facility]

## 2023-08-22 ENCOUNTER — Emergency Department (HOSPITAL_BASED_OUTPATIENT_CLINIC_OR_DEPARTMENT_OTHER)
Admission: EM | Admit: 2023-08-22 | Discharge: 2023-08-22 | Disposition: A | Discharge status: Home / Self Care | Payer: Medicare HMO | Attending: Emergency Medicine | Admitting: Emergency Medicine

## 2023-08-22 ENCOUNTER — Encounter (HOSPITAL_BASED_OUTPATIENT_CLINIC_OR_DEPARTMENT_OTHER): Payer: Self-pay | Admitting: Emergency Medicine

## 2023-08-22 ENCOUNTER — Other Ambulatory Visit: Payer: Self-pay

## 2023-08-22 ENCOUNTER — Emergency Department (HOSPITAL_BASED_OUTPATIENT_CLINIC_OR_DEPARTMENT_OTHER): Payer: Medicare HMO

## 2023-08-22 ENCOUNTER — Encounter: Payer: Self-pay | Admitting: Internal Medicine

## 2023-08-22 DIAGNOSIS — Z8543 Personal history of malignant neoplasm of ovary: Secondary | ICD-10-CM | POA: Insufficient documentation

## 2023-08-22 DIAGNOSIS — I6529 Occlusion and stenosis of unspecified carotid artery: Secondary | ICD-10-CM | POA: Diagnosis not present

## 2023-08-22 DIAGNOSIS — R519 Headache, unspecified: Secondary | ICD-10-CM | POA: Diagnosis not present

## 2023-08-22 DIAGNOSIS — I10 Essential (primary) hypertension: Secondary | ICD-10-CM | POA: Diagnosis not present

## 2023-08-22 DIAGNOSIS — F17210 Nicotine dependence, cigarettes, uncomplicated: Secondary | ICD-10-CM | POA: Insufficient documentation

## 2023-08-22 DIAGNOSIS — I676 Nonpyogenic thrombosis of intracranial venous system: Secondary | ICD-10-CM | POA: Diagnosis not present

## 2023-08-22 DIAGNOSIS — I771 Stricture of artery: Secondary | ICD-10-CM | POA: Diagnosis not present

## 2023-08-22 LAB — BASIC METABOLIC PANEL
Anion gap: 10 (ref 5–15)
BUN: 10 mg/dL (ref 6–20)
CO2: 25 mmol/L (ref 22–32)
Calcium: 8.8 mg/dL — ABNORMAL LOW (ref 8.9–10.3)
Chloride: 104 mmol/L (ref 98–111)
Creatinine, Ser: 0.76 mg/dL (ref 0.44–1.00)
GFR, Estimated: >60 mL/min (ref >60)
Glucose, Bld: 99 mg/dL (ref 70–99)
Potassium: 3.4 mmol/L — ABNORMAL LOW (ref 3.5–5.1)
Sodium: 139 mmol/L (ref 135–145)

## 2023-08-22 LAB — CBC
HCT: 23.8 % — ABNORMAL LOW (ref 36.0–46.0)
Hemoglobin: 7.9 g/dL — ABNORMAL LOW (ref 12.0–15.0)
MCH: 36.7 pg — ABNORMAL HIGH (ref 26.0–34.0)
MCHC: 33.2 g/dL (ref 30.0–36.0)
MCV: 110.7 fL — ABNORMAL HIGH (ref 80.0–100.0)
Platelets: 36 10*3/uL — ABNORMAL LOW (ref 150–400)
RBC: 2.15 MIL/uL — ABNORMAL LOW (ref 3.87–5.11)
RDW: 19.2 % — ABNORMAL HIGH (ref 11.5–15.5)
WBC: 3.4 10*3/uL — ABNORMAL LOW (ref 4.0–10.5)
nRBC: 0 % (ref 0.0–0.2)

## 2023-08-22 MED ORDER — PROCHLORPERAZINE EDISYLATE 10 MG/2ML IJ SOLN
10.0000 mg | Freq: Once | INTRAMUSCULAR | Status: AC
  Administered 2023-08-22: 10 mg via INTRAVENOUS
  Filled 2023-08-22: qty 2

## 2023-08-22 MED ORDER — SODIUM CHLORIDE 0.9 % IV BOLUS
1000.0000 mL | Freq: Once | INTRAVENOUS | Status: AC
  Administered 2023-08-22: 1000 mL via INTRAVENOUS

## 2023-08-22 MED ORDER — HEPARIN SOD (PORK) LOCK FLUSH 100 UNIT/ML IV SOLN
500.0000 [IU] | Freq: Once | INTRAVENOUS | Status: AC
  Administered 2023-08-22: 500 [IU]
  Filled 2023-08-22: qty 5

## 2023-08-22 MED ORDER — IOHEXOL 350 MG/ML SOLN
75.0000 mL | Freq: Once | INTRAVENOUS | Status: AC | PRN
  Administered 2023-08-22: 75 mL via INTRAVENOUS

## 2023-08-22 MED ORDER — DIPHENHYDRAMINE HCL 50 MG/ML IJ SOLN
25.0000 mg | Freq: Once | INTRAMUSCULAR | Status: AC
  Administered 2023-08-22: 25 mg via INTRAVENOUS
  Filled 2023-08-22: qty 1

## 2023-08-22 NOTE — ED Triage Notes (Addendum)
Pt in with severe L sided HA, first began 2 wks ago but worsened tonight. +dizziness also reported, pain worse when lying down. Hx of Stg 4 CA, last chemo early August. Takes Xarelto daily
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
## 2023-08-22 NOTE — Discharge Instructions (Signed)
You were evaluated in the Emergency Department and after careful evaluation, we did not find any emergent condition requiring admission or further testing in the hospital.  Your exam/testing today is overall reassuring.  No emergencies on your CT scans.  We discussed your blood counts being a bit lower than the last time we checked them.  Recommend discussing this with your oncology team.  Please return to the Emergency Department if you experience any worsening of your condition.   Thank you for allowing [place] to be a part of your care.

## 2023-08-29 ENCOUNTER — Encounter: Payer: Self-pay | Admitting: Internal Medicine

## 2023-08-29 ENCOUNTER — Other Ambulatory Visit: Payer: Self-pay

## 2023-08-29 ENCOUNTER — Inpatient Hospital Stay: Payer: Medicare HMO

## 2023-08-29 ENCOUNTER — Inpatient Hospital Stay (HOSPITAL_BASED_OUTPATIENT_CLINIC_OR_DEPARTMENT_OTHER): Payer: Medicare HMO | Admitting: Internal Medicine

## 2023-08-29 ENCOUNTER — Inpatient Hospital Stay: Payer: Medicare HMO | Attending: Internal Medicine

## 2023-08-29 VITALS — BP 111/64 | HR 78 | Temp 97.8°F | Ht 63.0 in | Wt 226.0 lb

## 2023-08-29 DIAGNOSIS — Z79899 Other long term (current) drug therapy: Secondary | ICD-10-CM | POA: Insufficient documentation

## 2023-08-29 DIAGNOSIS — Z7189 Other specified counseling: Secondary | ICD-10-CM

## 2023-08-29 DIAGNOSIS — I427 Cardiomyopathy due to drug and external agent: Secondary | ICD-10-CM | POA: Diagnosis not present

## 2023-08-29 DIAGNOSIS — Z5111 Encounter for antineoplastic chemotherapy: Secondary | ICD-10-CM | POA: Diagnosis present

## 2023-08-29 DIAGNOSIS — R059 Cough, unspecified: Secondary | ICD-10-CM

## 2023-08-29 DIAGNOSIS — C482 Malignant neoplasm of peritoneum, unspecified: Secondary | ICD-10-CM

## 2023-08-29 DIAGNOSIS — T451X5A Adverse effect of antineoplastic and immunosuppressive drugs, initial encounter: Secondary | ICD-10-CM

## 2023-08-29 LAB — CBC WITH DIFFERENTIAL (CANCER CENTER ONLY)
Abs Immature Granulocytes: 0.03 10*3/uL (ref 0.00–0.07)
Basophils Absolute: 0 10*3/uL (ref 0.0–0.1)
Basophils Relative: 1 %
Eosinophils Absolute: 0 10*3/uL (ref 0.0–0.5)
Eosinophils Relative: 1 %
HCT: 27.2 % — ABNORMAL LOW (ref 36.0–46.0)
Hemoglobin: 8.8 g/dL — ABNORMAL LOW (ref 12.0–15.0)
Immature Granulocytes: 1 %
Lymphocytes Relative: 29 %
Lymphs Abs: 0.9 10*3/uL (ref 0.7–4.0)
MCH: 37.4 pg — ABNORMAL HIGH (ref 26.0–34.0)
MCHC: 32.4 g/dL (ref 30.0–36.0)
MCV: 115.7 fL — ABNORMAL HIGH (ref 80.0–100.0)
Monocytes Absolute: 0.4 10*3/uL (ref 0.1–1.0)
Monocytes Relative: 12 %
Neutro Abs: 1.8 10*3/uL (ref 1.7–7.7)
Neutrophils Relative %: 56 %
Platelet Count: 100 10*3/uL — ABNORMAL LOW (ref 150–400)
RBC: 2.35 MIL/uL — ABNORMAL LOW (ref 3.87–5.11)
RDW: 20.3 % — ABNORMAL HIGH (ref 11.5–15.5)
WBC Count: 3.1 10*3/uL — ABNORMAL LOW (ref 4.0–10.5)
nRBC: 0 % (ref 0.0–0.2)

## 2023-08-29 LAB — CMP (CANCER CENTER ONLY)
ALT: 13 U/L (ref 0–44)
AST: 21 U/L (ref 15–41)
Albumin: 3.5 g/dL (ref 3.5–5.0)
Alkaline Phosphatase: 62 U/L (ref 38–126)
Anion gap: 8 (ref 5–15)
BUN: 10 mg/dL (ref 6–20)
CO2: 25 mmol/L (ref 22–32)
Calcium: 8.7 mg/dL — ABNORMAL LOW (ref 8.9–10.3)
Chloride: 103 mmol/L (ref 98–111)
Creatinine: 0.79 mg/dL (ref 0.44–1.00)
GFR, Estimated: >60 mL/min (ref >60)
Glucose, Bld: 138 mg/dL — ABNORMAL HIGH (ref 70–99)
Potassium: 3.4 mmol/L — ABNORMAL LOW (ref 3.5–5.1)
Sodium: 136 mmol/L (ref 135–145)
Total Bilirubin: 0.3 mg/dL (ref 0.3–1.2)
Total Protein: 5.8 g/dL — ABNORMAL LOW (ref 6.5–8.1)

## 2023-08-29 MED ORDER — HEPARIN SOD (PORK) LOCK FLUSH 100 UNIT/ML IV SOLN
500.0000 [IU] | Freq: Once | INTRAVENOUS | Status: AC
  Administered 2023-08-29: 500 [IU] via INTRAVENOUS
  Filled 2023-08-29: qty 5

## 2023-08-29 MED ORDER — POTASSIUM CHLORIDE CRYS ER 20 MEQ PO TBCR: 20.0000 meq | EXTENDED_RELEASE_TABLET | Freq: Two times a day (BID) | ORAL | 1 refills | Status: DC

## 2023-08-29 MED ORDER — FUROSEMIDE 20 MG PO TABS: 20.0000 mg | ORAL_TABLET | Freq: Every day | ORAL | 0 refills | Status: DC

## 2023-08-29 NOTE — Assessment & Plan Note (Addendum)
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 APRIL 4th 2024-recurrent peritoneal carcinomatosis.  No significant ascites.  No evidence of any bowel obstruction.  Last carboplatin-taxol-April 2021. Currently on with Doxil-carboplatin every 4 weeks. APRIL 2024- 2D echo- 55-60%. Post 3 cycles- 7/31-CT CAP-   Interval increased soft tissue thickening along the inferior aspect of the periumbilical hernia which could reflect progressive peritoneal disease.  Otherwise stable appearance of the abdomen and pelvis with mild nodular thickening of the peritoneal surfaces in the pelvis and soft tissue stranding in the supravesical fat. No generalized ascites or measurable peritoneal nodularity. No evidence of thoracic metastatic disease.ca 125- improving.  Reviewed the findings with gynecology oncology.  Given the overall symptomatic improvement and improvement in Ca1 2 5 recommend continued current chemotherapy.  # HOLD cycle # 5 Doxil-carboplatin given concern for exposure to TB [see below]; tumor markers-improving. CBC CMP are reviewed [undeyca at home]  # Probable exposure to patient with tuberculosis-recommend checking QuantiFERON gold.  Get chest x-ray.  Low clinical concerns of any active infection.  Again hold chemotherapy.  # BIL LE swelling-dependent edema-low clinical suspicion for CHF.  However.  2D echo given patient  is on Doxil. Start lasix 20 mg/day-again recommend compliance with potassium.  # Gum bleeding-grade 1- 2; on Xarelto -improved currently continue salt/backing soda rinses. Stable.   # Nausea/vomiting- G-1-2-continue Zofran and Compazine- Stable.   # constipation- miralax prn- Stable.   # Hypocalcemia-pending-Vit D levels; Hypokalemia- severe -3.3  -potassium.  Continue  K-Dur twice daily; Continue slow Mag.   # Ongoing MSK joints-February 2024 -bone scan negative for malignancy.  On oxycodone /Dr. Borders-   stable.   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 No new or acute finding.  Currently on xarelto;  stable.     # PN G-2/back pain [awaiting steroid injection]- on Lyrica 50 mg TID/Cymbalta- Stable.   # Anxiety/Insomnia--social stressors-continue Cymbalta [at 20 mg/day]; continue trazadone 150 mg qhs- see  Stable.  # Hot flashes: on Clonidine patch- on Friday weekly; and clonidine pill 0.1 qhs.  Stable.   #IV access/Mediport-currently s/p TPA- port flush.  Stable.   # DISPOSITION:  # HOLD chemo today. # CXR today.  # quantiferon gold lab test today; and then de-access # follow up in 2 weeks- MD; labs- cbc/cmp; ca-125; Doxil- carbo; KCL 20 meq;  2 d echo ASAP- Dr.B

## 2023-08-29 NOTE — Progress Notes (Signed)
Patient had CT on 08/22/2023 Patient has been having a little more shortness of breath than usual. She wants to know about getting testing for TB. Patient also fell last week and thinks she may have a cracked rib.
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
## 2023-08-30 LAB — CA 125: Cancer Antigen (CA) 125: 13.4 U/mL (ref 0.0–38.1)

## 2023-08-31 LAB — QUANTIFERON-TB GOLD PLUS (RQFGPL)
QuantiFERON Mitogen Value: >10 [IU]/mL
QuantiFERON Nil Value: 0.01 [IU]/mL
QuantiFERON TB1 Ag Value: 0.01 [IU]/mL
QuantiFERON TB2 Ag Value: 0.01 [IU]/mL

## 2023-08-31 LAB — QUANTIFERON-TB GOLD PLUS: QuantiFERON-TB Gold Plus: NEGATIVE

## 2023-09-01 ENCOUNTER — Encounter: Payer: Self-pay | Admitting: *Deleted

## 2023-09-03 ENCOUNTER — Ambulatory Visit (HOSPITAL_BASED_OUTPATIENT_CLINIC_OR_DEPARTMENT_OTHER)
Admission: RE | Admit: 2023-09-03 | Discharge: 2023-09-03 | Disposition: A | Discharge status: Home / Self Care | Payer: Medicare HMO | Source: Ambulatory Visit | Attending: Internal Medicine | Admitting: Internal Medicine

## 2023-09-03 DIAGNOSIS — R918 Other nonspecific abnormal finding of lung field: Secondary | ICD-10-CM | POA: Diagnosis not present

## 2023-09-03 DIAGNOSIS — R059 Cough, unspecified: Secondary | ICD-10-CM | POA: Diagnosis not present

## 2023-09-04 ENCOUNTER — Encounter: Payer: Self-pay | Admitting: Internal Medicine

## 2023-09-04 ENCOUNTER — Inpatient Hospital Stay (HOSPITAL_BASED_OUTPATIENT_CLINIC_OR_DEPARTMENT_OTHER): Payer: Medicare HMO | Admitting: Hospice and Palliative Medicine

## 2023-09-04 DIAGNOSIS — C482 Malignant neoplasm of peritoneum, unspecified: Secondary | ICD-10-CM

## 2023-09-04 NOTE — Progress Notes (Signed)
VM left. Will reschedule. 

## 2023-09-05 ENCOUNTER — Other Ambulatory Visit: Payer: Self-pay | Admitting: Internal Medicine

## 2023-09-06 ENCOUNTER — Encounter: Payer: Self-pay | Admitting: Internal Medicine

## 2023-09-06 MED ORDER — OXYCODONE HCL 5 MG PO TABS: 5.0000 mg | ORAL_TABLET | Freq: Four times a day (QID) | ORAL | 0 refills | Status: DC | PRN

## 2023-09-11 MED FILL — Dexamethasone Sodium Phosphate Inj 100 MG/10ML: INTRAMUSCULAR | Qty: 1 | Status: AC

## 2023-09-11 MED FILL — Fosaprepitant Dimeglumine For IV Infusion 150 MG (Base Eq): INTRAVENOUS | Qty: 5 | Status: AC

## 2023-09-12 ENCOUNTER — Inpatient Hospital Stay: Payer: Medicare HMO

## 2023-09-12 ENCOUNTER — Inpatient Hospital Stay (HOSPITAL_BASED_OUTPATIENT_CLINIC_OR_DEPARTMENT_OTHER): Payer: Medicare HMO | Admitting: Internal Medicine

## 2023-09-12 VITALS — BP 116/77 | HR 62 | Temp 98.7°F | Wt 233.0 lb

## 2023-09-12 DIAGNOSIS — Z7189 Other specified counseling: Secondary | ICD-10-CM

## 2023-09-12 DIAGNOSIS — C482 Malignant neoplasm of peritoneum, unspecified: Secondary | ICD-10-CM

## 2023-09-12 DIAGNOSIS — Z5111 Encounter for antineoplastic chemotherapy: Secondary | ICD-10-CM | POA: Diagnosis not present

## 2023-09-12 DIAGNOSIS — Z79899 Other long term (current) drug therapy: Secondary | ICD-10-CM | POA: Diagnosis not present

## 2023-09-12 LAB — CMP (CANCER CENTER ONLY)
ALT: 13 U/L (ref 0–44)
AST: 19 U/L (ref 15–41)
Albumin: 3.6 g/dL (ref 3.5–5.0)
Alkaline Phosphatase: 66 U/L (ref 38–126)
Anion gap: 6 (ref 5–15)
BUN: 11 mg/dL (ref 6–20)
CO2: 26 mmol/L (ref 22–32)
Calcium: 8.9 mg/dL (ref 8.9–10.3)
Chloride: 105 mmol/L (ref 98–111)
Creatinine: 0.89 mg/dL (ref 0.44–1.00)
GFR, Estimated: >60 mL/min (ref >60)
Glucose, Bld: 104 mg/dL — ABNORMAL HIGH (ref 70–99)
Potassium: 3.6 mmol/L (ref 3.5–5.1)
Sodium: 137 mmol/L (ref 135–145)
Total Bilirubin: 0.2 mg/dL — ABNORMAL LOW (ref 0.3–1.2)
Total Protein: 6.6 g/dL (ref 6.5–8.1)

## 2023-09-12 LAB — CBC WITH DIFFERENTIAL (CANCER CENTER ONLY)
Abs Immature Granulocytes: 0.03 10*3/uL (ref 0.00–0.07)
Basophils Absolute: 0 10*3/uL (ref 0.0–0.1)
Basophils Relative: 1 %
Eosinophils Absolute: 0 10*3/uL (ref 0.0–0.5)
Eosinophils Relative: 1 %
HCT: 33.7 % — ABNORMAL LOW (ref 36.0–46.0)
Hemoglobin: 10.8 g/dL — ABNORMAL LOW (ref 12.0–15.0)
Immature Granulocytes: 1 %
Lymphocytes Relative: 30 %
Lymphs Abs: 1.1 10*3/uL (ref 0.7–4.0)
MCH: 36.2 pg — ABNORMAL HIGH (ref 26.0–34.0)
MCHC: 32 g/dL (ref 30.0–36.0)
MCV: 113.1 fL — ABNORMAL HIGH (ref 80.0–100.0)
Monocytes Absolute: 0.4 10*3/uL (ref 0.1–1.0)
Monocytes Relative: 10 %
Neutro Abs: 2.2 10*3/uL (ref 1.7–7.7)
Neutrophils Relative %: 57 %
Platelet Count: 104 10*3/uL — ABNORMAL LOW (ref 150–400)
RBC: 2.98 MIL/uL — ABNORMAL LOW (ref 3.87–5.11)
RDW: 16.4 % — ABNORMAL HIGH (ref 11.5–15.5)
WBC Count: 3.8 10*3/uL — ABNORMAL LOW (ref 4.0–10.5)
nRBC: 0 % (ref 0.0–0.2)

## 2023-09-12 MED ORDER — HEPARIN SOD (PORK) LOCK FLUSH 100 UNIT/ML IV SOLN
500.0000 [IU] | Freq: Once | INTRAVENOUS | Status: AC | PRN
  Administered 2023-09-12: 500 [IU]
  Filled 2023-09-12: qty 5

## 2023-09-12 MED ORDER — SODIUM CHLORIDE 0.9 % IV SOLN
10.0000 mg | Freq: Once | INTRAVENOUS | Status: AC
  Administered 2023-09-12: 10 mg via INTRAVENOUS
  Filled 2023-09-12: qty 10

## 2023-09-12 MED ORDER — DOXORUBICIN HCL LIPOSOMAL CHEMO INJECTION 2 MG/ML
30.0000 mg/m2 | Freq: Once | INTRAVENOUS | Status: AC
  Administered 2023-09-12: 60 mg via INTRAVENOUS
  Filled 2023-09-12: qty 20

## 2023-09-12 MED ORDER — PALONOSETRON HCL INJECTION 0.25 MG/5ML
0.2500 mg | Freq: Once | INTRAVENOUS | Status: AC
  Administered 2023-09-12: 0.25 mg via INTRAVENOUS
  Filled 2023-09-12: qty 5

## 2023-09-12 MED ORDER — FAMOTIDINE IN NACL 20-0.9 MG/50ML-% IV SOLN
20.0000 mg | Freq: Once | INTRAVENOUS | Status: AC
  Administered 2023-09-12: 20 mg via INTRAVENOUS
  Filled 2023-09-12: qty 50

## 2023-09-12 MED ORDER — SODIUM CHLORIDE 0.9% FLUSH
10.0000 mL | INTRAVENOUS | Status: DC | PRN
  Filled 2023-09-12: qty 10

## 2023-09-12 MED ORDER — SODIUM CHLORIDE 0.9 % IV SOLN
150.0000 mg | Freq: Once | INTRAVENOUS | Status: AC
  Administered 2023-09-12: 150 mg via INTRAVENOUS
  Filled 2023-09-12: qty 150

## 2023-09-12 MED ORDER — SODIUM CHLORIDE 0.9 % IV SOLN
706.0000 mg | Freq: Once | INTRAVENOUS | Status: AC
  Administered 2023-09-12: 710 mg via INTRAVENOUS
  Filled 2023-09-12: qty 71

## 2023-09-12 MED ORDER — DIPHENHYDRAMINE HCL 50 MG/ML IJ SOLN
50.0000 mg | Freq: Once | INTRAMUSCULAR | Status: AC
  Administered 2023-09-12: 50 mg via INTRAVENOUS
  Filled 2023-09-12: qty 1

## 2023-09-12 MED ORDER — DEXTROSE 5 % IV SOLN
Freq: Once | INTRAVENOUS | Status: AC
  Filled 2023-09-12: qty 250

## 2023-09-12 NOTE — Assessment & Plan Note (Addendum)
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 APRIL 4th 2024-recurrent peritoneal carcinomatosis.  No significant ascites.  No evidence of any bowel obstruction.  Last carboplatin-taxol-April 2021. Currently on with Doxil-carboplatin every 4 weeks. APRIL 2024- 2D echo- 55-60%. Post 3 cycles- 7/31-CT CAP-   Interval increased soft tissue thickening along the inferior aspect of the periumbilical hernia which could reflect progressive peritoneal disease.  Otherwise stable appearance of the abdomen and pelvis with mild nodular thickening of the peritoneal surfaces in the pelvis and soft tissue stranding in the supravesical fat. No generalized ascites or measurable peritoneal nodularity. No evidence of thoracic metastatic disease.ca 125- improving.  Reviewed the findings with gynecology oncology.  Given the overall symptomatic improvement and improvement in Ca1 2 5 recommend continued current chemotherapy.  # Proceed with cycle # 5 Doxil-carboplatin- tumor markers-improving. CBC CMP are reviewed [undeyca at home]  # ? exposure to patient with tuberculosis QuantiFERON gold- NEG; CXR- NEG. no concerns for any tuberculosis at this time.  # BIL LE swelling-dependent edema-low clinical suspicion for CHF.  However.  2D echo given patient  is on Doxil. Start lasix 20 mg/day-again recommend compliance with potassium.  # Gum bleeding-grade 1- 2; on Xarelto -improved currently continue salt/backing soda rinses. Stable.   # Nausea/vomiting- G-1-2-continue Zofran and Compazine- Stable.   # constipation- miralax prn- Stable.   # Hypocalcemia-pending-Vit D levels; Hypokalemia- severe -3.3  -potassium.  Continue  K-Dur twice daily; Continue slow Mag. Stable.   # Ongoing MSK joints-February 2024 -bone scan negative for malignancy.  On oxycodone /Dr. Borders- Stable.   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 No new or acute finding.  Currently on xarelto- stable.     # PN G-2/back pain [awaiting steroid injection]- on Lyrica 50 mg TID/Cymbalta- Stable.   # Anxiety/Insomnia--social stressors-continue Cymbalta [at 20 mg/day]; continue trazadone 150 mg qhs- see  stable.   # Hot flashes: on Clonidine patch- on Friday weekly; and clonidine pill 0.1 qhs.  stable.   #IV access/Mediport-currently s/p TPA- port flush.  Stable.   # DISPOSITION:  # PLEASE SCHEDULE 2 D ECHO ASAP # check Ca 125 today # chemo today; no kcl # follow up in 4  weeks- MD; labs- cbc/cmp; ca-125; Doxil- carbo; KCL 20 meq;  - Dr.B
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
## 2023-09-12 NOTE — Patient Instructions (Signed)
[facility]  Discharge Instructions: Thank you for choosing [facility] to provide your oncology and hematology care.  If you have a lab appointment with the Cancer Center, please go directly to the Cancer Center and check in at the registration area.  Wear comfortable clothing and clothing appropriate for easy access to any Portacath or PICC line.   We strive to give you quality time with your provider. You may need to reschedule your appointment if you arrive late (15 or more minutes).  Arriving late affects you and other patients whose appointments are after yours.  Also, if you miss three or more appointments without notifying the office, you may be dismissed from the clinic at the provider's discretion.      For prescription refill requests, have your pharmacy contact our office and allow 72 hours for refills to be completed.    Today you received the following chemotherapy and/or immunotherapy agents- doxil, carboplatin      To help prevent nausea and vomiting after your treatment, we encourage you to take your nausea medication as directed.  BELOW ARE SYMPTOMS THAT SHOULD BE REPORTED IMMEDIATELY: *FEVER GREATER THAN 100.4 F (38 C) OR HIGHER *CHILLS OR SWEATING *NAUSEA AND VOMITING THAT IS NOT CONTROLLED WITH YOUR NAUSEA MEDICATION *UNUSUAL SHORTNESS OF BREATH *UNUSUAL BRUISING OR BLEEDING *URINARY PROBLEMS (pain or burning when urinating, or frequent urination) *BOWEL PROBLEMS (unusual diarrhea, constipation, pain near the anus) TENDERNESS IN MOUTH AND THROAT WITH OR WITHOUT PRESENCE OF ULCERS (sore throat, sores in mouth, or a toothache) UNUSUAL RASH, SWELLING OR PAIN  UNUSUAL VAGINAL DISCHARGE OR ITCHING   Items with * indicate a potential emergency and should be followed up as soon as possible or go to the Emergency Department if any problems should occur.  Please show the CHEMOTHERAPY ALERT CARD or IMMUNOTHERAPY ALERT CARD at  check-in to the Emergency Department and triage nurse.  Should you have questions after your visit or need to cancel or reschedule your appointment, please contact  [facility]  [phone] and follow the prompts.  Office hours are 8:00 a.m. to 4:30 p.m. Monday - Friday. Please note that voicemails left after 4:00 p.m. may not be returned until the following business day.  We are closed weekends and major holidays. You have access to a nurse at all times for urgent questions. Please call the main number to the clinic [phone] and follow the prompts.  For any non-urgent questions, you may also contact your provider using MyChart. We now offer e-Visits for anyone 40 and older to request care online for non-urgent symptoms. For details visit [email or web address].   Also download the MyChart app! Go to the app store, search "MyChart", open the app, select , and log in with your MyChart username and password.

## 2023-09-12 NOTE — Progress Notes (Signed)
Patient is feeling fatigued and more down than usual, she is doing about the same as her last visit. She is wondering why lab didn't get an CA125 like every time she has her blood drawn here?

## 2023-09-20 ENCOUNTER — Encounter: Payer: Self-pay | Admitting: Internal Medicine

## 2023-09-20 ENCOUNTER — Other Ambulatory Visit: Payer: Self-pay | Admitting: Internal Medicine

## 2023-09-21 ENCOUNTER — Encounter: Payer: Self-pay | Admitting: Internal Medicine

## 2023-09-21 MED ORDER — OXYCODONE HCL 5 MG PO TABS: 5.0000 mg | ORAL_TABLET | Freq: Four times a day (QID) | ORAL | 0 refills | Status: DC | PRN

## 2023-09-23 ENCOUNTER — Other Ambulatory Visit: Payer: Self-pay | Admitting: Family Medicine

## 2023-09-23 DIAGNOSIS — F331 Major depressive disorder, recurrent, moderate: Secondary | ICD-10-CM

## 2023-09-23 DIAGNOSIS — I700 Atherosclerosis of aorta: Secondary | ICD-10-CM

## 2023-09-23 DIAGNOSIS — K219 Gastro-esophageal reflux disease without esophagitis: Secondary | ICD-10-CM

## 2023-09-23 DIAGNOSIS — M5136 Other intervertebral disc degeneration, lumbar region: Secondary | ICD-10-CM

## 2023-09-27 ENCOUNTER — Ambulatory Visit: Payer: Self-pay | Admitting: *Deleted

## 2023-09-27 DIAGNOSIS — B001 Herpesviral vesicular dermatitis: Secondary | ICD-10-CM

## 2023-09-27 MED ORDER — VALACYCLOVIR HCL 1 G PO TABS
1000.0000 mg | ORAL_TABLET | Freq: Two times a day (BID) | ORAL | 2 refills | Status: DC | PRN
Start: 2023-09-27 — End: 2024-04-11

## 2023-09-27 NOTE — Telephone Encounter (Signed)
Summary: skin irritation on lip / rx req   The patient has noticed skin irritation on the right side of their lower lip  The patient first noticed the area of concern this morning 09/27/23  The patient would like to be prescribed valACYclovir (VALTREX) 1000 MG tablet [[id number]] to help with their discomfort  Please contact further when possible     Reason for Disposition . 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 Patient has refills remaining on their prescription  Answer Assessment - Initial Assessment Questions 1. APPEARANCE of BLISTERS: "Describe the sores."     Small bump- slightly larger than eraser- not erupted skin surface 2. SIZE: "How large an area is involved with the cold sores?" (e.g., inches, cm or compare to coins)     See above 3. LOCATION: "Which part of the lip is involved?"     Bottom right 4. ONSET: "When did the fever blisters begin?"     Noticed last night 5. RECURRENT BLISTERS: "Have you had fever blisters before?" If Yes, ask: "When was the last time?" "How many times a year?"     Yes- has been treated in the past- over 1 year 6. OTHER SYMPTOMS: "Do you have any other symptoms?" (e.g., fever, sores inside mouth)     no  Answer Assessment - Initial Assessment Questions 1. DRUG NAME: "What medicine do you need to have refilled?"     Valtrex 2. REFILLS REMAINING: "How many refills are remaining?" (Note: The label on the medicine or pill bottle will show how many refills are remaining. If there are no refills remaining, then a renewal may be needed.)     Should have current Rx- 5/24  4. PRESCRIBING HCP: "Who prescribed it?" Reason: If prescribed by specialist, call should be referred to that group.     PCP 5. SYMPTOMS: "Do you have any symptoms?"     Cold sore Patient unaware of Rx- she does not use [facility] mail order anymore- Rx was sent there-  rerouted to local pharmacy for patient.  Protocols used: Cold Sores (Fever Blisters)-A-AH, Medication Refill and Renewal Call-A-AH

## 2023-09-27 NOTE — Telephone Encounter (Signed)
  Chief Complaint: cold sore- medication request Symptoms: cold sore starting- bump lower lip- R Frequency: Symptoms started yesterday pm Pertinent Negatives: Patient denies sore eruption Disposition: [] ED /[] Urgent Care (no appt availability in office) / [] Appointment(In office/virtual)/ []  [facility]/ [] Home Care/ [] Refused Recommended Disposition /[] [place] [facility]/ [x]  Follow-up with PCP Additional Notes: Patient had valtrex Rx sent 04/27/23-  but was sent to [facility]- which she does not use anymore- since current Rx- resent to [facility] as requested- patient to call back if this does not help. Patient does have f/u appointment in December.

## 2023-09-28 ENCOUNTER — Other Ambulatory Visit: Payer: Self-pay | Admitting: Internal Medicine

## 2023-09-28 NOTE — Telephone Encounter (Signed)
Component Ref Range & Units 2 wk ago (09/12/23) 1 mo ago (08/29/23) 1 mo ago (08/22/23) 1 mo ago (07/31/23) 2 mo ago (07/03/23) 3 mo ago (06/02/23) 4 mo ago (05/19/23)  Potassium 3.5 - 5.1 mmol/L 3.6 3.4 Low  3.4 Low  3.1 Low  3.3 Low  2.9 Low  CM 2.6 Low Panic

## 2023-10-03 ENCOUNTER — Ambulatory Visit
Admission: RE | Admit: 2023-10-03 | Discharge: 2023-10-03 | Disposition: A | Discharge status: Home / Self Care | Payer: Medicare HMO | Source: Ambulatory Visit | Attending: Internal Medicine | Admitting: Internal Medicine

## 2023-10-03 DIAGNOSIS — I427 Cardiomyopathy due to drug and external agent: Secondary | ICD-10-CM | POA: Insufficient documentation

## 2023-10-03 DIAGNOSIS — C801 Malignant (primary) neoplasm, unspecified: Secondary | ICD-10-CM | POA: Diagnosis not present

## 2023-10-03 DIAGNOSIS — T451X5A Adverse effect of antineoplastic and immunosuppressive drugs, initial encounter: Secondary | ICD-10-CM | POA: Insufficient documentation

## 2023-10-03 DIAGNOSIS — I361 Nonrheumatic tricuspid (valve) insufficiency: Secondary | ICD-10-CM | POA: Insufficient documentation

## 2023-10-03 DIAGNOSIS — I10 Essential (primary) hypertension: Secondary | ICD-10-CM | POA: Insufficient documentation

## 2023-10-03 LAB — ECHOCARDIOGRAM COMPLETE
AR max vel: 2.19 cm2
AV Area VTI: 2.31 cm2
AV Area mean vel: 2.33 cm2
AV Mean grad: 5.5 mm[Hg]
AV Peak grad: 10.2 mm[Hg]
Ao pk vel: 1.6 m/s
Area-P 1/2: 4.44 cm2
S' Lateral: 3.3 cm

## 2023-10-05 ENCOUNTER — Other Ambulatory Visit: Payer: Self-pay | Admitting: Hospice and Palliative Medicine

## 2023-10-05 MED ORDER — OXYCODONE HCL 5 MG PO TABS: 5.0000 mg | ORAL_TABLET | Freq: Four times a day (QID) | ORAL | 0 refills | Status: DC | PRN

## 2023-10-06 MED FILL — Fosaprepitant Dimeglumine For IV Infusion 150 MG (Base Eq): INTRAVENOUS | Qty: 5 | Status: AC

## 2023-10-06 MED FILL — Dexamethasone Sodium Phosphate Inj 100 MG/10ML: INTRAMUSCULAR | Qty: 1 | Status: AC

## 2023-10-09 ENCOUNTER — Inpatient Hospital Stay (HOSPITAL_BASED_OUTPATIENT_CLINIC_OR_DEPARTMENT_OTHER): Payer: Medicare HMO | Admitting: Internal Medicine

## 2023-10-09 ENCOUNTER — Encounter: Payer: Self-pay | Admitting: Internal Medicine

## 2023-10-09 ENCOUNTER — Inpatient Hospital Stay: Payer: Medicare HMO | Attending: Internal Medicine

## 2023-10-09 ENCOUNTER — Inpatient Hospital Stay: Payer: Medicare HMO

## 2023-10-09 ENCOUNTER — Other Ambulatory Visit: Payer: Self-pay | Admitting: Internal Medicine

## 2023-10-09 DIAGNOSIS — C482 Malignant neoplasm of peritoneum, unspecified: Secondary | ICD-10-CM

## 2023-10-09 DIAGNOSIS — Z5111 Encounter for antineoplastic chemotherapy: Secondary | ICD-10-CM | POA: Insufficient documentation

## 2023-10-09 DIAGNOSIS — Z79899 Other long term (current) drug therapy: Secondary | ICD-10-CM | POA: Diagnosis not present

## 2023-10-09 DIAGNOSIS — Z7189 Other specified counseling: Secondary | ICD-10-CM

## 2023-10-09 LAB — CMP (CANCER CENTER ONLY)
ALT: 13 U/L (ref 0–44)
AST: 23 U/L (ref 15–41)
Albumin: 3.7 g/dL (ref 3.5–5.0)
Alkaline Phosphatase: 66 U/L (ref 38–126)
Anion gap: 5 (ref 5–15)
BUN: 9 mg/dL (ref 6–20)
CO2: 26 mmol/L (ref 22–32)
Calcium: 9.4 mg/dL (ref 8.9–10.3)
Chloride: 106 mmol/L (ref 98–111)
Creatinine: 0.87 mg/dL (ref 0.44–1.00)
GFR, Estimated: >60 mL/min (ref >60)
Glucose, Bld: 146 mg/dL — ABNORMAL HIGH (ref 70–99)
Potassium: 3.2 mmol/L — ABNORMAL LOW (ref 3.5–5.1)
Sodium: 137 mmol/L (ref 135–145)
Total Bilirubin: 0.6 mg/dL (ref 0.3–1.2)
Total Protein: 6.8 g/dL (ref 6.5–8.1)

## 2023-10-09 LAB — CBC WITH DIFFERENTIAL (CANCER CENTER ONLY)
Abs Immature Granulocytes: 0.01 10*3/uL (ref 0.00–0.07)
Basophils Absolute: 0 10*3/uL (ref 0.0–0.1)
Basophils Relative: 1 %
Eosinophils Absolute: 0 10*3/uL (ref 0.0–0.5)
Eosinophils Relative: 1 %
HCT: 27.5 % — ABNORMAL LOW (ref 36.0–46.0)
Hemoglobin: 9 g/dL — ABNORMAL LOW (ref 12.0–15.0)
Immature Granulocytes: 0 %
Lymphocytes Relative: 53 %
Lymphs Abs: 1.2 10*3/uL (ref 0.7–4.0)
MCH: 36.3 pg — ABNORMAL HIGH (ref 26.0–34.0)
MCHC: 32.7 g/dL (ref 30.0–36.0)
MCV: 110.9 fL — ABNORMAL HIGH (ref 80.0–100.0)
Monocytes Absolute: 0.3 10*3/uL (ref 0.1–1.0)
Monocytes Relative: 15 %
Neutro Abs: 0.7 10*3/uL — ABNORMAL LOW (ref 1.7–7.7)
Neutrophils Relative %: 30 %
Platelet Count: 85 10*3/uL — ABNORMAL LOW (ref 150–400)
RBC: 2.48 MIL/uL — ABNORMAL LOW (ref 3.87–5.11)
RDW: 17.9 % — ABNORMAL HIGH (ref 11.5–15.5)
WBC Count: 2.3 10*3/uL — ABNORMAL LOW (ref 4.0–10.5)
nRBC: 0 % (ref 0.0–0.2)

## 2023-10-09 MED ORDER — HEPARIN SOD (PORK) LOCK FLUSH 100 UNIT/ML IV SOLN
500.0000 [IU] | Freq: Once | INTRAVENOUS | Status: AC
  Administered 2023-10-09: 500 [IU] via INTRAVENOUS
  Filled 2023-10-09: qty 5
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
## 2023-10-09 NOTE — Progress Notes (Signed)
Had an echo at [facility] 10/03/23.  Appetite 50%, comes and goes, no supplements.  Having some nausea.  Constipation, stool softener.

## 2023-10-09 NOTE — Assessment & Plan Note (Addendum)
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 APRIL 4th 2024-recurrent peritoneal carcinomatosis.  No significant ascites.  No evidence of any bowel obstruction.  Last carboplatin-taxol-April 2021. Currently on with Doxil-carboplatin every 4 weeks. APRIL 2024- 2D echo- 55-60%. Post 3 cycles- 7/31-CT CAP-   Interval increased soft tissue thickening along the inferior aspect of the periumbilical hernia which could reflect progressive peritoneal disease.  Otherwise stable appearance of the abdomen and pelvis with mild nodular thickening of the peritoneal surfaces in the pelvis and soft tissue stranding in the supravesical fat. No generalized ascites or measurable peritoneal nodularity. No evidence of thoracic metastatic disease.ca 125- improving.  Reviewed the findings with gynecology oncology.  Given the overall symptomatic improvement and improvement in Ca1 2 5 recommend continued current chemotherapy.  # HOLD cycle # 6 Doxil-carboplatin-sec to neutropenia; platelets- 85; otherwise- tumor markers-improving. CBC CMP are reviewed [undeyca at home]; 10/03/2023- Left ventricular ejection fraction, by estimation, is 55 to 60%   # BIL LE swelling-dependent edema-low clinical suspicion for CHF. S/p  lasix 20 mg/day- stable. -again recommend compliance with potassium.  # Gum bleeding-grade 1- 2; on Xarelto -improved currently continue salt/backing soda rinses. Stable.   # Nausea/vomiting- G-1-2-continue Zofran and Compazine- Stable.   # constipation- miralax prn- Stable.   # Hypocalcemia-pending-Vit D levels; Hypokalemia- severe -3.3  -potassium.  Continue  K-Dur twice daily; Continue slow Mag. Stable.   # Ongoing MSK joints-February 2024 -bone scan negative for malignancy.  On oxycodone /Dr. Danae Orleans.   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 No new or acute finding.  Currently on xarelto- Stable.     # PN G-2/back pain [awaiting steroid injection]- on Lyrica 50 mg TID/Cymbalta-Stable.   # Anxiety/Insomnia--social stressors-continue Cymbalta [at 20 mg/day]; continue trazadone 150 mg qhs- see Stable.   # Hot flashes: on Clonidine patch- on Friday weekly; and clonidine pill 0.1 qhs. Stable.   #IV access/Mediport-currently s/p TPA- port flush.  Stable.   # DISPOSITION:  # HOLD chemo today; no kcl; de-access # follow up in 2  weeks- X- MD; port-labs- cbc/cmp; ca-125; Doxil- carbo; KCL 20 meq;  # follow up in 6 weeks- MD; port-llabs- cbc/c,p; ca 125; No chemo- CT CAP prior- Dr.B

## 2023-10-10 LAB — CA 125: Cancer Antigen (CA) 125: 13.6 U/mL (ref 0.0–38.1)

## 2023-10-12 ENCOUNTER — Encounter: Payer: Self-pay | Admitting: Internal Medicine

## 2023-10-16 ENCOUNTER — Other Ambulatory Visit: Payer: Self-pay

## 2023-10-16 ENCOUNTER — Encounter: Payer: Self-pay | Admitting: Family Medicine

## 2023-10-16 DIAGNOSIS — Z1231 Encounter for screening mammogram for malignant neoplasm of breast: Secondary | ICD-10-CM

## 2023-10-16 DIAGNOSIS — R928 Other abnormal and inconclusive findings on diagnostic imaging of breast: Secondary | ICD-10-CM

## 2023-10-16 DIAGNOSIS — Z1501 Genetic susceptibility to malignant neoplasm of breast: Secondary | ICD-10-CM

## 2023-10-16 DIAGNOSIS — Z1509 Genetic susceptibility to other malignant neoplasm: Secondary | ICD-10-CM

## 2023-10-17 ENCOUNTER — Other Ambulatory Visit: Payer: Self-pay | Admitting: Hospice and Palliative Medicine

## 2023-10-18 ENCOUNTER — Inpatient Hospital Stay (HOSPITAL_BASED_OUTPATIENT_CLINIC_OR_DEPARTMENT_OTHER): Payer: Medicare HMO | Admitting: Hospice and Palliative Medicine

## 2023-10-18 ENCOUNTER — Ambulatory Visit (HOSPITAL_COMMUNITY): Payer: Self-pay | Admitting: Licensed Clinical Social Worker

## 2023-10-18 DIAGNOSIS — C482 Malignant neoplasm of peritoneum, unspecified: Secondary | ICD-10-CM | POA: Diagnosis not present

## 2023-10-18 DIAGNOSIS — Z515 Encounter for palliative care: Secondary | ICD-10-CM | POA: Diagnosis not present

## 2023-10-18 DIAGNOSIS — G893 Neoplasm related pain (acute) (chronic): Secondary | ICD-10-CM

## 2023-10-18 MED ORDER — OXYCODONE HCL 5 MG PO TABS: 5.0000 mg | ORAL_TABLET | Freq: Four times a day (QID) | ORAL | 0 refills | Status: DC | PRN

## 2023-10-18 NOTE — Progress Notes (Signed)
Virtual Visit via Telephone Note  I connected with [name] on 10/18/23 at  3:20 PM EDT by telephone and verified that I am speaking with the correct person using two identifiers.  Location: Patient: Home Provider: Clinic   I discussed the limitations, risks, security and privacy concerns of performing an evaluation and management service by telephone and the availability of in person appointments. I also discussed with the patient that there may be a patient responsible charge related to this service. The patient expressed understanding and agreed to proceed.   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  Patient is on systemic chemotherapy.  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  Patient has also had chronic abdominal pain.  She was referred to palliative care to help address goals and manage ongoing symptoms.   Observations/Objective: Follow-up visit.  I spoke with patient by phone.  She reports that she is feeling "rundown" but does not elucidate on specifics.  Saw Dr. Donneta Romberg on 10/09/2023 with treatment held due to neutropenia and thrombocytopenia.  Patient has follow-up scheduled on 10/28 for consideration of cycle 6 Doxil carboplatin.  Symptomatically, she says that she is doing okay.  Chronic musculoskeletal pain is stable the patient says that she is taking oxycodone about every 6 hours.  Denies taking more frequently.  Does request refill.  PDMP reviewed.  Assessment and Plan: Stage IV serous adenocarcinoma -on Doxil-carboplatin.  Follow-up on 10/28   Neoplasm related  pain-refill oxycodone.    Follow Up Instructions: Follow-up telephone visit 1-2 months   I discussed the assessment and treatment plan with the patient. The patient was provided an opportunity to ask questions and all were answered. The patient agreed with the plan and demonstrated an understanding of the instructions.   The patient was advised to call back or seek an in-person evaluation if the symptoms worsen or if the condition fails to improve as anticipated.  I provided 5 minutes of non-face-to-face time during this encounter.   Malachy Moan, NP

## 2023-10-19 ENCOUNTER — Encounter: Payer: Self-pay | Admitting: Internal Medicine

## 2023-10-20 MED FILL — Fosaprepitant Dimeglumine For IV Infusion 150 MG (Base Eq): INTRAVENOUS | Qty: 5 | Status: AC

## 2023-10-23 ENCOUNTER — Inpatient Hospital Stay: Payer: Medicare HMO

## 2023-10-23 ENCOUNTER — Inpatient Hospital Stay (HOSPITAL_BASED_OUTPATIENT_CLINIC_OR_DEPARTMENT_OTHER): Payer: Medicare HMO | Admitting: Internal Medicine

## 2023-10-23 VITALS — BP 108/64 | HR 60 | Temp 98.2°F | Wt 222.0 lb

## 2023-10-23 DIAGNOSIS — C482 Malignant neoplasm of peritoneum, unspecified: Secondary | ICD-10-CM

## 2023-10-23 DIAGNOSIS — Z5111 Encounter for antineoplastic chemotherapy: Secondary | ICD-10-CM | POA: Insufficient documentation

## 2023-10-23 DIAGNOSIS — Z79899 Other long term (current) drug therapy: Secondary | ICD-10-CM | POA: Diagnosis not present

## 2023-10-23 DIAGNOSIS — Z7189 Other specified counseling: Secondary | ICD-10-CM

## 2023-10-23 LAB — CBC WITH DIFFERENTIAL (CANCER CENTER ONLY)
Abs Immature Granulocytes: 0.02 10*3/uL (ref 0.00–0.07)
Basophils Absolute: 0 10*3/uL (ref 0.0–0.1)
Basophils Relative: 1 %
Eosinophils Absolute: 0.1 10*3/uL (ref 0.0–0.5)
Eosinophils Relative: 2 %
HCT: 32.5 % — ABNORMAL LOW (ref 36.0–46.0)
Hemoglobin: 10.8 g/dL — ABNORMAL LOW (ref 12.0–15.0)
Immature Granulocytes: 1 %
Lymphocytes Relative: 35 %
Lymphs Abs: 1.2 10*3/uL (ref 0.7–4.0)
MCH: 36.6 pg — ABNORMAL HIGH (ref 26.0–34.0)
MCHC: 33.2 g/dL (ref 30.0–36.0)
MCV: 110.2 fL — ABNORMAL HIGH (ref 80.0–100.0)
Monocytes Absolute: 0.4 10*3/uL (ref 0.1–1.0)
Monocytes Relative: 13 %
Neutro Abs: 1.6 10*3/uL — ABNORMAL LOW (ref 1.7–7.7)
Neutrophils Relative %: 48 %
Platelet Count: 125 10*3/uL — ABNORMAL LOW (ref 150–400)
RBC: 2.95 MIL/uL — ABNORMAL LOW (ref 3.87–5.11)
RDW: 15.8 % — ABNORMAL HIGH (ref 11.5–15.5)
WBC Count: 3.3 10*3/uL — ABNORMAL LOW (ref 4.0–10.5)
nRBC: 0 % (ref 0.0–0.2)

## 2023-10-23 LAB — CMP (CANCER CENTER ONLY)
ALT: 11 U/L (ref 0–44)
AST: 17 U/L (ref 15–41)
Albumin: 3.7 g/dL (ref 3.5–5.0)
Alkaline Phosphatase: 68 U/L (ref 38–126)
Anion gap: 6 (ref 5–15)
BUN: 12 mg/dL (ref 6–20)
CO2: 27 mmol/L (ref 22–32)
Calcium: 9 mg/dL (ref 8.9–10.3)
Chloride: 104 mmol/L (ref 98–111)
Creatinine: 0.88 mg/dL (ref 0.44–1.00)
GFR, Estimated: >60 mL/min (ref >60)
Glucose, Bld: 107 mg/dL — ABNORMAL HIGH (ref 70–99)
Potassium: 3.5 mmol/L (ref 3.5–5.1)
Sodium: 137 mmol/L (ref 135–145)
Total Bilirubin: 0.4 mg/dL (ref 0.3–1.2)
Total Protein: 6.7 g/dL (ref 6.5–8.1)

## 2023-10-23 MED ORDER — SODIUM CHLORIDE 0.9 % IV SOLN
INTRAVENOUS | Status: DC
Start: 2023-10-23 — End: 2023-10-23
  Filled 2023-10-23: qty 250

## 2023-10-23 MED ORDER — DEXTROSE 5 % IV SOLN
Freq: Once | INTRAVENOUS | Status: AC
  Filled 2023-10-23: qty 250

## 2023-10-23 MED ORDER — SODIUM CHLORIDE 0.9 % IV SOLN
712.5000 mg | Freq: Once | INTRAVENOUS | Status: AC
  Administered 2023-10-23: 710 mg via INTRAVENOUS
  Filled 2023-10-23: qty 71

## 2023-10-23 MED ORDER — FAMOTIDINE IN NACL 20-0.9 MG/50ML-% IV SOLN
20.0000 mg | Freq: Once | INTRAVENOUS | Status: AC
  Administered 2023-10-23: 20 mg via INTRAVENOUS
  Filled 2023-10-23: qty 50

## 2023-10-23 MED ORDER — DEXTROSE 5 % IV SOLN
30.0000 mg/m2 | Freq: Once | INTRAVENOUS | Status: AC
  Administered 2023-10-23: 60 mg via INTRAVENOUS
  Filled 2023-10-23: qty 10

## 2023-10-23 MED ORDER — DEXAMETHASONE SODIUM PHOSPHATE 10 MG/ML IJ SOLN
10.0000 mg | Freq: Once | INTRAMUSCULAR | Status: AC
Start: 2023-10-23 — End: 2023-10-23
  Administered 2023-10-23: 10 mg via INTRAVENOUS
  Filled 2023-10-23: qty 1

## 2023-10-23 MED ORDER — DIPHENHYDRAMINE HCL 50 MG/ML IJ SOLN
50.0000 mg | Freq: Once | INTRAMUSCULAR | Status: AC
  Administered 2023-10-23: 50 mg via INTRAVENOUS
  Filled 2023-10-23: qty 1

## 2023-10-23 MED ORDER — PALONOSETRON HCL INJECTION 0.25 MG/5ML
0.2500 mg | Freq: Once | INTRAVENOUS | Status: AC
Start: 2023-10-23 — End: 2023-10-23
  Administered 2023-10-23: 0.25 mg via INTRAVENOUS
  Filled 2023-10-23: qty 5

## 2023-10-23 MED ORDER — HEPARIN SOD (PORK) LOCK FLUSH 100 UNIT/ML IV SOLN
500.0000 [IU] | Freq: Once | INTRAVENOUS | Status: AC | PRN
  Administered 2023-10-23: 500 [IU]
  Filled 2023-10-23: qty 5

## 2023-10-23 MED ORDER — SODIUM CHLORIDE 0.9 % IV SOLN
150.0000 mg | Freq: Once | INTRAVENOUS | Status: AC
  Administered 2023-10-23: 150 mg via INTRAVENOUS
  Filled 2023-10-23: qty 150
  Filled 2023-10-23: qty 5

## 2023-10-23 NOTE — Progress Notes (Signed)
Patient has been having some constipation issues and what she has used hasn't helped, which she does have an appointment with a GI doctor soon.
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
## 2023-10-23 NOTE — Patient Instructions (Signed)

## 2023-10-24 LAB — CA 125: Cancer Antigen (CA) 125: 13.4 U/mL (ref 0.0–38.1)

## 2023-10-25 DIAGNOSIS — R180 Malignant ascites: Secondary | ICD-10-CM | POA: Diagnosis not present

## 2023-10-25 DIAGNOSIS — K5909 Other constipation: Secondary | ICD-10-CM | POA: Diagnosis not present

## 2023-10-25 DIAGNOSIS — C801 Malignant (primary) neoplasm, unspecified: Secondary | ICD-10-CM | POA: Diagnosis not present

## 2023-10-29 ENCOUNTER — Encounter (HOSPITAL_BASED_OUTPATIENT_CLINIC_OR_DEPARTMENT_OTHER): Payer: Self-pay

## 2023-10-29 ENCOUNTER — Emergency Department (HOSPITAL_BASED_OUTPATIENT_CLINIC_OR_DEPARTMENT_OTHER): Payer: Medicare HMO

## 2023-10-29 ENCOUNTER — Other Ambulatory Visit: Payer: Self-pay

## 2023-10-29 ENCOUNTER — Emergency Department (HOSPITAL_BASED_OUTPATIENT_CLINIC_OR_DEPARTMENT_OTHER)
Admission: EM | Admit: 2023-10-29 | Discharge: 2023-10-30 | Disposition: A | Discharge status: Home / Self Care | Payer: Medicare HMO | Attending: Emergency Medicine | Admitting: Emergency Medicine

## 2023-10-29 DIAGNOSIS — I10 Essential (primary) hypertension: Secondary | ICD-10-CM | POA: Diagnosis not present

## 2023-10-29 DIAGNOSIS — Z79899 Other long term (current) drug therapy: Secondary | ICD-10-CM | POA: Insufficient documentation

## 2023-10-29 DIAGNOSIS — K432 Incisional hernia without obstruction or gangrene: Secondary | ICD-10-CM | POA: Diagnosis not present

## 2023-10-29 DIAGNOSIS — K5900 Constipation, unspecified: Secondary | ICD-10-CM | POA: Diagnosis not present

## 2023-10-29 DIAGNOSIS — K5641 Fecal impaction: Secondary | ICD-10-CM | POA: Insufficient documentation

## 2023-10-29 DIAGNOSIS — Z7901 Long term (current) use of anticoagulants: Secondary | ICD-10-CM | POA: Insufficient documentation

## 2023-10-29 DIAGNOSIS — R1013 Epigastric pain: Secondary | ICD-10-CM | POA: Diagnosis present

## 2023-10-29 LAB — COMPREHENSIVE METABOLIC PANEL
ALT: 16 U/L (ref 0–44)
AST: 23 U/L (ref 15–41)
Albumin: 3.8 g/dL (ref 3.5–5.0)
Alkaline Phosphatase: 100 U/L (ref 38–126)
Anion gap: 12 (ref 5–15)
BUN: 13 mg/dL (ref 6–20)
CO2: 25 mmol/L (ref 22–32)
Calcium: 8.5 mg/dL — ABNORMAL LOW (ref 8.9–10.3)
Chloride: 101 mmol/L (ref 98–111)
Creatinine, Ser: 0.65 mg/dL (ref 0.44–1.00)
GFR, Estimated: >60 mL/min (ref >60)
Glucose, Bld: 105 mg/dL — ABNORMAL HIGH (ref 70–99)
Potassium: 3 mmol/L — ABNORMAL LOW (ref 3.5–5.1)
Sodium: 138 mmol/L (ref 135–145)
Total Bilirubin: 0.7 mg/dL (ref 0.3–1.2)
Total Protein: 6.5 g/dL (ref 6.5–8.1)

## 2023-10-29 LAB — URINALYSIS, ROUTINE W REFLEX MICROSCOPIC
Bilirubin Urine: NEGATIVE
Glucose, UA: NEGATIVE mg/dL
Hgb urine dipstick: NEGATIVE
Ketones, ur: NEGATIVE mg/dL
Leukocytes,Ua: NEGATIVE
Nitrite: NEGATIVE
Protein, ur: NEGATIVE mg/dL
Specific Gravity, Urine: 1.02 (ref 1.005–1.030)
pH: 6 (ref 5.0–8.0)

## 2023-10-29 LAB — CBC
HCT: 30.1 % — ABNORMAL LOW (ref 36.0–46.0)
Hemoglobin: 10 g/dL — ABNORMAL LOW (ref 12.0–15.0)
MCH: 35.7 pg — ABNORMAL HIGH (ref 26.0–34.0)
MCHC: 33.2 g/dL (ref 30.0–36.0)
MCV: 107.5 fL — ABNORMAL HIGH (ref 80.0–100.0)
Platelets: 86 K/uL — ABNORMAL LOW (ref 150–400)
RBC: 2.8 MIL/uL — ABNORMAL LOW (ref 3.87–5.11)
RDW: 15.4 % (ref 11.5–15.5)
WBC: 8.5 K/uL (ref 4.0–10.5)
nRBC: 0 % (ref 0.0–0.2)

## 2023-10-29 LAB — LIPASE, BLOOD: Lipase: 25 U/L (ref 11–51)

## 2023-10-29 LAB — PREGNANCY, URINE: Preg Test, Ur: NEGATIVE

## 2023-10-29 MED ORDER — MORPHINE SULFATE (PF) 4 MG/ML IV SOLN
4.0000 mg | Freq: Once | INTRAVENOUS | Status: AC
  Administered 2023-10-29: 4 mg via INTRAVENOUS
  Filled 2023-10-29: qty 1

## 2023-10-29 MED ORDER — SORBITOL 70 % SOLN: 960.0000 mL | TOPICAL_OIL | Freq: Once | ORAL | Status: DC

## 2023-10-29 MED ORDER — IOHEXOL 300 MG/ML  SOLN
100.0000 mL | Freq: Once | INTRAMUSCULAR | Status: AC | PRN
  Administered 2023-10-29: 100 mL via INTRAVENOUS

## 2023-10-29 MED ORDER — MINERAL OIL RE ENEM
1.0000 | ENEMA | Freq: Once | RECTAL | Status: DC
  Filled 2023-10-29: qty 1

## 2023-10-29 MED ORDER — HEPARIN SOD (PORK) LOCK FLUSH 100 UNIT/ML IV SOLN
500.0000 [IU] | Freq: Once | INTRAVENOUS | Status: AC
  Administered 2023-10-30: 500 [IU]
  Filled 2023-10-29: qty 5

## 2023-10-29 MED ORDER — POTASSIUM CHLORIDE CRYS ER 20 MEQ PO TBCR
40.0000 meq | EXTENDED_RELEASE_TABLET | Freq: Once | ORAL | Status: AC
  Administered 2023-10-30: 40 meq via ORAL
  Filled 2023-10-29: qty 2

## 2023-10-29 NOTE — ED Notes (Signed)
Pt unable to leave urine sample at this time.  

## 2023-10-29 NOTE — ED Notes (Signed)
Blood samples obtained via patients power port. Accessed with 20gx1 inch Huber needle. Flushed with no issues.

## 2023-10-29 NOTE — ED Provider Notes (Signed)
[place] [facility] Provider Note   CSN: [id number] Arrival date & time: 10/29/23  2001     History  Chief Complaint  Patient presents with   Abdominal Pain    [name] is a 52 y.o. female.  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  Pain is localized mostly to the epigastric region without radiation.  She also struggles with constipation frequently.  She states her last normal bowel movement was approximately 1 month ago.  She has had a few episodes of watery stool but has not passed any stool over the past 2 weeks.  She has also not been passing any gas since Tuesday of this week.  Reports a history of abdominal surgery including debulking surgery due to her cancer.  She developed urinary frequency on Tuesday as well.  She denies any fevers or chills.  She does report nausea but no vomiting.  She reports the urge to defecate but states that nothing comes out.  No history of bowel obstruction in the past.  She has been using MiraLAX, magnesium citrate and many other medications in order to try to have a bowel movement.   Abdominal Pain Associated symptoms: constipation        Home Medications Prior to Admission medications   Medication Sig Start Date End Date Taking? Authorizing Provider  buPROPion (WELLBUTRIN XL) 300 MG 24 hr tablet Take 1 tablet (300 mg total) by mouth daily. 08/09/23   [name], MD  cariprazine (VRAYLAR) 1.5 MG capsule Take 1 capsule (1.5 mg total) by mouth daily. 08/09/23   [name], MD  cloNIDine (CATAPRES - DOSED IN MG/24 HR) 0.3 mg/24hr patch Place 1 patch (0.3 mg total) onto the skin once a week. Friday 07/31/23   Earna Coder, MD  cloNIDine (CATAPRES) 0.1 MG tablet Take 1 tablet (0.1 mg total) by mouth at bedtime. 07/31/23   Earna Coder, MD  cyclobenzaprine (FLEXERIL) 5  MG tablet Take 5 mg by mouth 3 (three) times daily as needed for muscle spasms. 10/17/22   [provider]  docusate sodium (COLACE) 100 MG capsule Take 100 mg by mouth 2 (two) times daily.    [provider]  DULoxetine (CYMBALTA) 60 MG capsule TAKE 1 CAPSULE BY MOUTH DAILY 09/25/23   [name], MD  furosemide (LASIX) 20 MG tablet Take 1 tablet (20 mg total) by mouth daily. 08/29/23   Earna Coder, MD  KLOR-CON M20 20 MEQ tablet TAKE 1 TABLET BY MOUTH EVERY DAY 09/28/23   Earna Coder, MD  LORazepam (ATIVAN) 0.5 MG tablet Take 1 tablet (0.5 mg total) by mouth every 12 (twelve) hours as needed for anxiety. 10/21/22   Earna Coder, MD  Magnesium Cl-Calcium Carbonate (SLOW MAGNESIUM/CALCIUM) 70-117 MG TBEC Take 1 tablet by mouth 2 (two) times daily. 08/03/23   Earna Coder, MD  meclizine (ANTIVERT) 25 MG tablet 1 pill at night as needed; if dizziness not improved can take one pill every 8 hours as needed/tolerated. 01/10/23   Earna Coder, MD  Multiple Vitamin (MULTIVITAMIN WITH MINERALS) TABS tablet Take 1 tablet by mouth daily.    [provider]  nystatin (MYCOSTATIN/NYSTOP) powder Apply 1 Application topically 3 (three) times daily. 09/28/22   [name], MD  omeprazole (PRILOSEC) 20 MG capsule TAKE 1 CAPSULE BY MOUTH DAILY 09/25/23   [name], MD  ondansetron (ZOFRAN-ODT) 8 MG disintegrating tablet Take 1 tablet (8 mg total) by mouth every 8 (eight) hours as needed for nausea or vomiting. 07/31/23   Earna Coder, MD  oxyCODONE (OXY IR/ROXICODONE) 5 MG immediate release tablet Take 1 tablet (5 mg total) by mouth every 6 (six) hours as needed for severe pain (pain score 7-10). 10/18/23   Borders, [name], NP  Pegfilgrastim-cbqv (UDENYCA) 6 MG/0.6ML SOAJ Inject 6 mg into the skin every 28 (twenty-eight) days. 07/31/23   Earna Coder, MD  polyethylene glycol powder (GLYCOLAX/MIRALAX) 17 GM/SCOOP powder Take  0.5 Containers by mouth daily as needed for mild constipation or moderate constipation. 02/17/20   [provider]  predniSONE (DELTASONE) 10 MG tablet TAKE 1 TABLET (10 MG TOTAL) BY MOUTH DAILY WITH BREAKFAST. Patient not taking: Reported on 10/09/2023 09/28/23 12/27/23  Earna Coder, MD  pregabalin (LYRICA) 150 MG capsule Take 150 mg by mouth 3 (three) times daily.    E, Tanner Cyprus, PA-C  prochlorperazine (COMPAZINE) 10 MG tablet Take 1 tablet (10 mg total) by mouth every 6 (six) hours as needed for nausea or vomiting. 07/31/23   Earna Coder, MD  rivaroxaban (XARELTO) 20 MG TABS tablet Take 1 tablet (20 mg total) by mouth daily with supper. 07/31/23   Earna Coder, MD  rosuvastatin (CRESTOR) 5 MG tablet TAKE 1 TABLET BY MOUTH ONCE  DAILY (IN PLACE OF ATORVASTATIN) 09/25/23   Sowles, Danna Hefty, MD  senna (SENOKOT) 8.6 MG tablet Take 1 tablet by mouth daily as needed for constipation.    [provider]  SUMAtriptan (IMITREX) 100 MG tablet Take 1 tablet (100 mg total) by mouth every 2 (two) hours as needed for migraine. May repeat in 2 hours if headache persists or recurs. 08/09/23   [name], MD  Tiotropium Bromide Monohydrate (SPIRIVA RESPIMAT) 2.5 MCG/ACT AERS Inhale 2 puffs into the lungs daily. 08/09/23   [name], MD  traZODone (DESYREL) 150 MG tablet Take 1 tablet (150 mg total) by mouth at bedtime as needed for sleep. 07/31/23   Earna Coder, MD  valACYclovir (VALTREX) 1000 MG tablet Take 1 tablet (1,000 mg total) by mouth 2 (two) times daily as needed. 09/27/23   [name], MD  ranitidine (ZANTAC) 300 MG tablet Take 1 tablet (300 mg total) by mouth daily as needed for heartburn. 04/18/18 10/24/19  [name], MD      Allergies    Hydroxyzine hcl    Review of Systems   Review of Systems  Gastrointestinal:  Positive for abdominal pain and constipation.  All other systems reviewed and are negative.   Physical  Exam Updated Vital Signs BP 113/63   Pulse 83   Temp 98 F (36.7 C)   Resp 18   Ht 5\' 3"  (1.6 m)   Wt 100.7 kg   LMP 06/19/2017   SpO2 100%   BMI 39.33 kg/m  Physical Exam Vitals and nursing note reviewed.  Constitutional:      General: She is not in acute distress.    Appearance: She is well-developed.     Comments: Resting comfortably in bed  HENT:     Head: Normocephalic and atraumatic.  Eyes:     Conjunctiva/sclera: Conjunctivae normal.  Cardiovascular:     Rate and Rhythm: Normal rate and regular rhythm.     Heart sounds: No murmur heard. Pulmonary:     Effort: Pulmonary effort is normal. No respiratory distress.     Breath sounds: Normal  breath sounds.  Abdominal:     General: Bowel sounds are increased.     Palpations: Abdomen is soft.     Tenderness: There is no abdominal tenderness. There is no guarding. Negative signs include McBurney's sign.  Musculoskeletal:        General: No swelling.     Cervical back: Neck supple.  Skin:    General: Skin is warm and dry.     Capillary Refill: Capillary refill takes less than 2 seconds.  Neurological:     General: No focal deficit present.     Mental Status: She is alert and oriented to person, place, and time.  Psychiatric:        Mood and Affect: Mood normal.     ED Results / Procedures / Treatments   Labs (all labs ordered are listed, but only abnormal results are displayed) Labs Reviewed  COMPREHENSIVE METABOLIC PANEL - Abnormal; Notable for the following components:      Result Value   Potassium 3.0 (*)    Glucose, Bld 105 (*)    Calcium 8.5 (*)    All other components within normal limits  CBC - Abnormal; Notable for the following components:   RBC 2.80 (*)    Hemoglobin 10.0 (*)    HCT 30.1 (*)    MCV 107.5 (*)    MCH 35.7 (*)    Platelets 86 (*)    All other components within normal limits  URINALYSIS, ROUTINE W REFLEX MICROSCOPIC - Abnormal; Notable for the following components:   APPearance  CLOUDY (*)    All other components within normal limits  LIPASE, BLOOD  PREGNANCY, URINE    EKG None  Radiology CT ABDOMEN PELVIS W CONTRAST  Result Date: 10/29/2023 CLINICAL DATA:  Concern for bowel obstruction. EXAM: CT ABDOMEN AND PELVIS WITH CONTRAST TECHNIQUE: Multidetector CT imaging of the abdomen and pelvis was performed using the standard protocol following bolus administration of intravenous contrast. RADIATION DOSE REDUCTION: This exam was performed according to the departmental dose-optimization program which includes automated exposure control, adjustment of the mA and/or kV according to patient size and/or use of iterative reconstruction technique. CONTRAST:  OMNIPAQUE IOHEXOL 300 MG/ML  SOLN COMPARISON:  CT abdomen pelvis dated 07/26/2023. FINDINGS: Lower chest: The visualized lung bases are clear. Partially visualized tip of a central venous line in the right atrium. No intra-abdominal free air.  Trace free fluid in the pelvis. Hepatobiliary: Probable mild fatty liver. No intrahepatic biliary dilatation. The gallbladder is unremarkable. Pancreas: Unremarkable. No pancreatic ductal dilatation or surrounding inflammatory changes. Spleen: Normal in size without focal abnormality. Adrenals/Urinary Tract: The adrenal glands are unremarkable. Stent 25 there is no hydronephrosis on either side. There is symmetric enhancement and excretion of contrast by both kidneys. The visualized ureters and urinary bladder appear unremarkable. Stomach/Bowel: There is moderate dense stool throughout the colon to the level of the sigmoid colon. The rectum is collapsed. Minimal perisigmoid and perirectal stranding, nonspecific. Clinical correlation is recommended to exclude stercoral colitis. There is no bowel obstruction. Appendectomy. Vascular/Lymphatic: The abdominal aorta and IVC are unremarkable. No portal venous gas. There is no adenopathy. Reproductive: Hysterectomy.  No adnexal masses. Other:  Midline vertical anterior abdominal wall incisional scar. There is a midline ventral/incisional hernia containing a short segment of the transverse colon without evidence of obstruction. The neck of the hernia measures approximately 5 cm in transverse axial diameter. Musculoskeletal: No acute or significant osseous findings. IMPRESSION: 1. Constipation with probable fecal impaction in the sigmoid  colon. Mild perisigmoid and perirectal stranding nonspecific. Clinical correlation recommended to exclude stercoral colitis. No bowel obstruction. 2. Midline ventral/incisional hernia containing a short segment of the transverse colon without evidence of obstruction. Electronically Signed   By: Elgie Collard M.D.   On: 10/29/2023 22:28    Procedures Procedures    Medications Ordered in ED Medications  potassium chloride SA (KLOR-CON M) CR tablet 40 mEq (has no administration in time range)  heparin lock flush 100 unit/mL (has no administration in time range)  morphine (PF) 4 MG/ML injection 4 mg (4 mg Intravenous Given 10/29/23 2059)  iohexol (OMNIPAQUE) 300 MG/ML solution 100 mL (100 mLs Intravenous Contrast Given 10/29/23 2202)  morphine (PF) 4 MG/ML injection 4 mg (4 mg Intravenous Given 10/29/23 2316)    ED Course/ Medical Decision Making/ A&P                                 Medical Decision Making Amount and/or Complexity of Data Reviewed Labs: ordered. Radiology: ordered.  Risk Prescription drug management.  This patient presents to the ED for concern of abdominal pain, constipation, this involves an extensive number of treatment options, and is a complaint that carries with it a high risk of complications and morbidity. The differential diagnosis for generalized abdominal pain includes, but is not limited to AAA, gastroenteritis, appendicitis, Bowel obstruction, Bowel perforation. Gastroparesis, DKA, Hernia, Inflammatory bowel disease, mesenteric ischemia, pancreatitis, peritonitis SBP,  volvulus.   My initial workup includes labs, imaging, pain control  Additional history obtained from: Nursing notes from this visit. Previous records within EMR system oncology visit on 10/23/2023.  I ordered, reviewed and interpreted labs which include: CBC, CMP, lipase, urinalysis  I ordered imaging studies including CT abdomen pelvis I independently visualized and interpreted imaging which showed sigmoid colon fecal impaction. Ventral hernia without obstruction.  I agree with the radiologist interpretation  Afebrile, hemodynamically stable. 52 year old female presenting for abdominal pain and constipation. She has a significant history of constipation and has seen GI for this, most recently on 10/25/2023. She was told to increase her miralax intake at that time. She has done this as well as numerous other methods but still has not had a normal bowel movement in approximately 4 weeks. She reports some nausea but no vomiting. Lab workup significant for hypokalemia of 3.0 which was replenished in the ED. No worsening of baseline anemia. No tachycardia, fever or leukocytosis to suggest infection.  CT abdomen pelvis reveals sigmoid colon fecal impaction. No rectal stool. Patient was given a soap suds enema in the department without success. Patient was strongly encouraged to follow up with her primary GI provider as soon as possible. She was encouraged to continue her laxative regimen. She was given strict return precautions. Stable at discharge.  At this time there does not appear to be any evidence of an acute emergency medical condition and the patient appears stable for discharge with appropriate outpatient follow up. Diagnosis was discussed with patient who verbalizes understanding of care plan and is agreeable to discharge. I have discussed return precautions with patient who verbalizes understanding. Patient encouraged to follow-up with their PCP within 1 week. All questions  answered.  Patient's case discussed with Dr. Donnald Garre who agrees with plan to discharge with follow-up.   Note: Portions of this report may have been transcribed using voice recognition software. Every effort was made to ensure accuracy; however, inadvertent computerized transcription errors  may still be present.        Final Clinical Impression(s) / ED Diagnoses Final diagnoses:  Fecal impaction [facility])    Rx / DC Orders ED Discharge Orders     None         Mora Bellman 10/29/23 2356    Arby Barrette, MD 10/29/23 2358

## 2023-10-29 NOTE — Discharge Instructions (Signed)
You have been seen today for your complaint of abdominal pain, constipation. Your lab work  showed low potassium but was otherwise reassuring. Your imaging showed a fecal impaction in your colon. This is too high up for a manual disimpaction. Home care instructions are as follows:  Continue using enemas and laxatives until you are able to have a normal bowel movement Follow up with: your primary GI provider as soon as possible Please seek immediate medical care if you develop any of the following symptoms: You have black or tarry stools. At this time there does not appear to be the presence of an emergent medical condition, however there is always the potential for conditions to change. Please read and follow the below instructions.  Do not take your medicine if  develop an itchy rash, swelling in your mouth or lips, or difficulty breathing; call 911 and seek immediate emergency medical attention if this occurs.  You may review your lab tests and imaging results in their entirety on your MyChart account.  Please discuss all results of fully with your primary care provider and other specialist at your follow-up visit.  Note: Portions of this text may have been transcribed using voice recognition software. Every effort was made to ensure accuracy; however, inadvertent computerized transcription errors may still be present.

## 2023-10-29 NOTE — ED Triage Notes (Signed)
Pt endorses abd pain since Wednesday that stays in the epigastric region. Pt endorses nausea and diarrhea. Pt has not had a BM sx1 month. Diarrhea is sometimes clear, sometimes light brown. 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 Pt is not passing gas either. Pt taking zofran at home with no help. Pt not eating well.

## 2023-10-30 ENCOUNTER — Telehealth: Payer: Self-pay

## 2023-10-30 DIAGNOSIS — K5641 Fecal impaction: Secondary | ICD-10-CM | POA: Diagnosis not present

## 2023-10-30 NOTE — Telephone Encounter (Signed)
Patient called stating she has not had a bowel movement in over a month or more. Patient states last Wednesday she stopped having gas. She states she contacted her new GI doctor whom recommended taking mirilax. Patient states the mirilax made her nausea and her stomach upset. Patient states she was seen in the ED last night where they attempted to do a enema but was not successful. Patient states they told she had fecal impaction and recommended her speak with her GI doctor whom she has contacted and recommended her to finish taking the mirilax. Patient states she is miserable and is pain and can't stomach the mirilax. Patient states she has taken mirilax, a stool softener, and magnesium oxide but nothing is working or helping her at the moment.

## 2023-10-31 ENCOUNTER — Ambulatory Visit (HOSPITAL_COMMUNITY): Payer: Self-pay | Admitting: Family

## 2023-10-31 ENCOUNTER — Ambulatory Visit: Payer: Medicare HMO | Admitting: Psychology

## 2023-11-02 ENCOUNTER — Encounter: Payer: Self-pay | Admitting: Internal Medicine

## 2023-11-02 ENCOUNTER — Other Ambulatory Visit: Payer: Self-pay | Admitting: *Deleted

## 2023-11-06 ENCOUNTER — Encounter: Payer: Self-pay | Admitting: Internal Medicine

## 2023-11-06 ENCOUNTER — Other Ambulatory Visit: Payer: Self-pay | Admitting: Hospice and Palliative Medicine

## 2023-11-06 ENCOUNTER — Ambulatory Visit
Admission: RE | Admit: 2023-11-06 | Discharge: 2023-11-06 | Disposition: A | Discharge status: Home / Self Care | Payer: Medicare HMO | Source: Ambulatory Visit | Attending: Internal Medicine | Admitting: Internal Medicine

## 2023-11-06 DIAGNOSIS — C786 Secondary malignant neoplasm of retroperitoneum and peritoneum: Secondary | ICD-10-CM | POA: Diagnosis not present

## 2023-11-06 DIAGNOSIS — C482 Malignant neoplasm of peritoneum, unspecified: Secondary | ICD-10-CM | POA: Diagnosis not present

## 2023-11-06 DIAGNOSIS — R911 Solitary pulmonary nodule: Secondary | ICD-10-CM | POA: Diagnosis not present

## 2023-11-06 DIAGNOSIS — S2231XA Fracture of one rib, right side, initial encounter for closed fracture: Secondary | ICD-10-CM | POA: Diagnosis not present

## 2023-11-06 MED ORDER — UDENYCA 6 MG/0.6ML ~~LOC~~ SOAJ: 6.0000 mg | SUBCUTANEOUS | 0 refills | Status: DC

## 2023-11-06 MED ORDER — HEPARIN SOD (PORK) LOCK FLUSH 100 UNIT/ML IV SOLN
INTRAVENOUS | Status: AC
Start: 2023-11-06 — End: ?
  Filled 2023-11-06: qty 5

## 2023-11-06 MED ORDER — IOHEXOL 300 MG/ML  SOLN
100.0000 mL | Freq: Once | INTRAMUSCULAR | Status: AC | PRN
  Administered 2023-11-06: 100 mL via INTRAVENOUS

## 2023-11-06 MED ORDER — HEPARIN SOD (PORK) LOCK FLUSH 100 UNIT/ML IV SOLN
500.0000 [IU] | Freq: Once | INTRAVENOUS | Status: AC
Start: 2023-11-06 — End: 2023-11-06
  Administered 2023-11-06: 500 [IU] via INTRAVENOUS

## 2023-11-07 ENCOUNTER — Encounter: Payer: Self-pay | Admitting: Internal Medicine

## 2023-11-07 MED ORDER — OXYCODONE HCL 5 MG PO TABS: 5.0000 mg | ORAL_TABLET | Freq: Four times a day (QID) | ORAL | 0 refills | Status: DC | PRN

## 2023-11-08 ENCOUNTER — Telehealth: Payer: Self-pay

## 2023-11-08 NOTE — Telephone Encounter (Signed)
PA sent through cover my meds for patient's at home [place]   Key: [id number]

## 2023-11-10 NOTE — Telephone Encounter (Signed)
PA Case ID #: [id number] Rx #: [id number] Need Help? Call us at [phone] Outcome Denied on November 14 by [facility] NCPDP 2017 We cover this drug when our criteria are met. The unmet criteria are: the requested medication won&apos;t be given with filgrastim, biosimilar filgrastim (for example: filgrastim-sndz or filgrastim-aafi), tbo-filgrastim, or sargramostim (unless part of stem cell mobilization protocol) or within seven days of pegfilgrastim or biosimilar pegfilgrastim (for example: pegfilgrastim-jmbd or pegfilgastrim-cbqv) AND won&apos;t be given more than once per chemotherapy cycle. This decision was from [facility] Preferred Pegfilgrastim Products - Neulasta (pegfilgrastim), Fulphila (pegfilgrastim-jmdb), and Udenyca (pegfilgrastim-cbqv) Pharmacy Coverage Policy.

## 2023-11-15 ENCOUNTER — Ambulatory Visit: Payer: Medicare Other | Admitting: Family Medicine

## 2023-11-16 ENCOUNTER — Ambulatory Visit: Payer: Medicare HMO | Admitting: Psychology

## 2023-11-17 ENCOUNTER — Encounter: Payer: Self-pay | Admitting: Internal Medicine

## 2023-11-17 ENCOUNTER — Inpatient Hospital Stay: Payer: Medicare HMO | Attending: Internal Medicine

## 2023-11-17 ENCOUNTER — Inpatient Hospital Stay (HOSPITAL_BASED_OUTPATIENT_CLINIC_OR_DEPARTMENT_OTHER): Payer: Medicare HMO | Attending: Internal Medicine | Admitting: Internal Medicine

## 2023-11-17 VITALS — BP 116/80 | HR 88 | Temp 98.8°F | Ht 63.0 in | Wt 208.8 lb

## 2023-11-17 DIAGNOSIS — C482 Malignant neoplasm of peritoneum, unspecified: Secondary | ICD-10-CM

## 2023-11-17 DIAGNOSIS — Z95828 Presence of other vascular implants and grafts: Secondary | ICD-10-CM

## 2023-11-17 DIAGNOSIS — Z23 Encounter for immunization: Secondary | ICD-10-CM | POA: Diagnosis not present

## 2023-11-17 DIAGNOSIS — Z79899 Other long term (current) drug therapy: Secondary | ICD-10-CM | POA: Insufficient documentation

## 2023-11-17 LAB — CMP (CANCER CENTER ONLY)
ALT: 19 U/L (ref 0–44)
AST: 29 U/L (ref 15–41)
Albumin: 4.3 g/dL (ref 3.5–5.0)
Alkaline Phosphatase: 71 U/L (ref 38–126)
Anion gap: 14 (ref 5–15)
BUN: 9 mg/dL (ref 6–20)
CO2: 23 mmol/L (ref 22–32)
Calcium: 9.3 mg/dL (ref 8.9–10.3)
Chloride: 102 mmol/L (ref 98–111)
Creatinine: 0.85 mg/dL (ref 0.44–1.00)
GFR, Estimated: >60 mL/min (ref >60)
Glucose, Bld: 154 mg/dL — ABNORMAL HIGH (ref 70–99)
Potassium: 3.3 mmol/L — ABNORMAL LOW (ref 3.5–5.1)
Sodium: 139 mmol/L (ref 135–145)
Total Bilirubin: 0.9 mg/dL (ref <1.2)
Total Protein: 7.1 g/dL (ref 6.5–8.1)

## 2023-11-17 LAB — CBC WITH DIFFERENTIAL (CANCER CENTER ONLY)
Abs Immature Granulocytes: 0.01 10*3/uL (ref 0.00–0.07)
Basophils Absolute: 0 10*3/uL (ref 0.0–0.1)
Basophils Relative: 1 %
Eosinophils Absolute: 0 10*3/uL (ref 0.0–0.5)
Eosinophils Relative: 1 %
HCT: 29.2 % — ABNORMAL LOW (ref 36.0–46.0)
Hemoglobin: 10 g/dL — ABNORMAL LOW (ref 12.0–15.0)
Immature Granulocytes: 0 %
Lymphocytes Relative: 42 %
Lymphs Abs: 1.1 10*3/uL (ref 0.7–4.0)
MCH: 36 pg — ABNORMAL HIGH (ref 26.0–34.0)
MCHC: 34.2 g/dL (ref 30.0–36.0)
MCV: 105 fL — ABNORMAL HIGH (ref 80.0–100.0)
Monocytes Absolute: 0.4 10*3/uL (ref 0.1–1.0)
Monocytes Relative: 17 %
Neutro Abs: 1 10*3/uL — ABNORMAL LOW (ref 1.7–7.7)
Neutrophils Relative %: 39 %
Platelet Count: 49 10*3/uL — ABNORMAL LOW (ref 150–400)
RBC: 2.78 MIL/uL — ABNORMAL LOW (ref 3.87–5.11)
RDW: 16.3 % — ABNORMAL HIGH (ref 11.5–15.5)
WBC Count: 2.6 10*3/uL — ABNORMAL LOW (ref 4.0–10.5)
nRBC: 0 % (ref 0.0–0.2)

## 2023-11-17 MED ORDER — HEPARIN SOD (PORK) LOCK FLUSH 100 UNIT/ML IV SOLN
500.0000 [IU] | Freq: Once | INTRAVENOUS | Status: AC
  Administered 2023-11-17: 500 [IU] via INTRAVENOUS
  Filled 2023-11-17: qty 5

## 2023-11-17 MED ORDER — SODIUM CHLORIDE 0.9% FLUSH
10.0000 mL | Freq: Once | INTRAVENOUS | Status: AC
  Administered 2023-11-17: 10 mL via INTRAVENOUS
  Filled 2023-11-17: qty 10

## 2023-11-17 MED ORDER — INFLUENZA VIRUS VACC SPLIT PF (FLUZONE) 0.5 ML IM SUSY
0.5000 mL | PREFILLED_SYRINGE | Freq: Once | INTRAMUSCULAR | Status: AC
  Administered 2023-11-17: 0.5 mL via INTRAMUSCULAR
  Filled 2023-11-17: qty 0.5

## 2023-11-17 MED ORDER — ZOLPIDEM TARTRATE 5 MG PO TABS: 5.0000 mg | ORAL_TABLET | Freq: Every evening | ORAL | 0 refills | Status: DC | PRN

## 2023-11-17 NOTE — Progress Notes (Unsigned)
CT CAP 11/06/23.  C/o constipation/impacted, GI put her on linzess, miralax and stool softener. Seen at [facility]. CT was obtained. 2 BM's a week.  C/o abdominal hernia pain, 6/10.  Pt states she felt like she was going to pass out this week. Dizziness, black spots. Having some nausea.  Trouble sleeping, trazodone not helping.  C/o constantly hearing pulse/heart rate in ears. Ears pop a lot. No drainage.  Appetite is 25% normal, no supplements.  C/o SOB, no worse.  Has feelings of haplessness, on bupropion, vraylar.  Daughter [redacted] weeks pregnant.

## 2023-11-17 NOTE — Progress Notes (Unsigned)
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 No new or acute finding.  Currently on xarelto-  Stable.     # PN G-2/back pain- on Lyrica 50 mg TID/Cymbalta- Stable.    # Anxiety/Insomnia--social stressors-continue Cymbalta [at 60 mg/day]; continue trazadone 150 mg at bedtime-add Ambien 5 mg- new script ordered;   # Hot flashes: on Clonidine patch- on Friday weekly; and clonidine pill 0.1 qhs. Stable.   #IV access/Mediport-currently s/p TPA- port flush.  Stable.   # DISPOSITION:  # follow up in 3  weeks- MD; port-llabs- cbc/c,p; ca 125; No chemo- Dr.B      Earna Coder, MD 11/20/2023 10:29 AM

## 2023-11-17 NOTE — Assessment & Plan Note (Addendum)
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  NOV 18th, CT CAP-   over all STABLE- No significant interval change; Minimal areas of peritoneal nodularity are similar to previous to slightly decreased. No developing ascites or lymph node enlargement. No pleural effusion seen today.    # Patient's cancer markers have improved.  Recommend a chemo break-given slow recovery of the bone marrow.  Will also review with gynecology oncology.  Consider single agent Doxil q 4 Weeks.  CBC CMP are reviewed [undeyca at home]; 10/03/2023- Left ventricular ejection fraction, by estimation, is 55 to 60%- stable.   # BIL LE swelling-dependent edema-low clinical suspicion for CHF. S/p  lasix 20 mg/day- stable  # Gum bleeding-grade 1- 2; on Xarelto -improved currently continue salt/backing soda rinses. Stable.   # Nausea/vomiting- G-1-2-continue Zofran and Compazine- Stable.   # constipation- miralax prn- Stable.   # Abdominal discomfort-combination of underlying malignancy and also ventral hernia.  Discussed unfortunately given her malignancy ventral hernia surgery/repair unlikely.  Recommend abdominal binder.  # Hypocalcemia-pending-Vit D levels; Hypokalemia- severe -3.3  -potassium.  Continue  K-Dur twice daily; Continue slow Mag. Stable.   # Ongoing MSK joints-February 2024 -bone scan negative for malignancy.  On oxycodone /Dr. Borders- Stable.   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 No new or acute finding.  Currently on xarelto-  Stable.     # PN G-2/back pain- on Lyrica 50 mg TID/Cymbalta- Stable.    # Anxiety/Insomnia--social stressors-continue Cymbalta [at 60 mg/day]; continue trazadone 150 mg at bedtime-add Ambien 5 mg- new script ordered; consider cerlua care referral.  # Hot  flashes: on Clonidine patch- on Friday weekly; and clonidine pill 0.1 qhs. Stable.   #IV access/Mediport-currently s/p TPA- port flush.  Stable.   # DISPOSITION:  # follow up in 3  weeks- MD; port-llabs- cbc/c,p; ca 125; No chemo- Dr.B

## 2023-11-19 LAB — CA 125: Cancer Antigen (CA) 125: 15.2 U/mL (ref 0.0–38.1)

## 2023-11-20 ENCOUNTER — Encounter: Payer: Self-pay | Admitting: Internal Medicine

## 2023-11-21 ENCOUNTER — Other Ambulatory Visit: Payer: Self-pay | Admitting: Hospice and Palliative Medicine

## 2023-11-21 MED ORDER — OXYCODONE HCL 5 MG PO TABS: 5.0000 mg | ORAL_TABLET | Freq: Four times a day (QID) | ORAL | 0 refills | Status: DC | PRN

## 2023-11-28 ENCOUNTER — Ambulatory Visit: Payer: Medicare HMO | Admitting: Psychology

## 2023-11-30 NOTE — Progress Notes (Unsigned)
Name: [name]   MRN: [id number]    DOB: Jan 20, 1971   Date:11/30/2023       Progress Note  Subjective  Chief Complaint  No chief complaint on file.   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   Dural sinus thrombosis 01/10/2023   DDD (degenerative disc disease), lumbar 09/28/2022   Intertrigo 09/28/2022   Metabolic syndrome 09/28/2022   Moderate episode of recurrent major depressive disorder (HCC) 06/17/2022   Smokers' cough (HCC) 05/17/2022   Dyslipidemia 05/17/2022   Elevated TSH 05/17/2022   Pre-diabetes 05/17/2022   Ventral hernia without obstruction or gangrene 04/26/2021   Malignant ascites 12/22/2019   Pleural effusion on right 12/22/2019   Tachycardia 12/22/2019   Primary peritoneal carcinomatosis (HCC) 12/16/2019   Goals of care, counseling/discussion 12/16/2019   Erythrocytosis 11/12/2019   Morbid obesity (HCC) 07/07/2018   BRCA1 gene mutation positive 06/18/2018   Fever blister 05/17/2018   Family history of breast cancer 05/08/2018   Family history of colon cancer in mother 05/08/2018   Migraine with aura and without status migrainosus 04/18/2018   Mild recurrent major depression (HCC) 01/20/2016   GERD without esophagitis 01/20/2016   Osteoarthritis, multiple sites 01/20/2016    Past Surgical History:  Procedure Laterality Date   ABDOMINAL HYSTERECTOMY  03/2020   APPENDECTOMY     LSC but "ruptured when they did the surgery"   BREAST BIOPSY Left 01/04/2021   MRI BX   CESAREAN SECTION     CYSTOSCOPY N/A 04/08/2020   Procedure: CYSTOSCOPY;  Surgeon: Artelia Laroche, MD;  [name];  Service: Gynecology;  [name];   INSERTION OF MESH N/A 04/26/2021   Procedure: INSERTION OF MESH;  Surgeon: Earline Mayotte, MD;  [name];  Service:  General;  [name];   IR THORACENTESIS ASP PLEURAL SPACE W/IMG GUIDE  12/06/2019   IUD REMOVAL N/A 04/08/2020   Procedure: INTRAUTERINE DEVICE (IUD) REMOVAL;  Surgeon: Artelia Laroche, MD;  [name];  Service: Gynecology;  [name];   PARACENTESIS     x6   PORTA CATH INSERTION N/A 04/23/2020   Procedure: PORTA CATH INSERTION;  Surgeon: Annice Needy, MD;  Location: [facility];  Service: Cardiovascular;  [name];   TUBAL LIGATION     at time of CSxn   VENTRAL HERNIA REPAIR N/A 04/26/2021   Procedure: HERNIA REPAIR VENTRAL ADULT;  Surgeon: Earline Mayotte, MD;  [name];  Service: General;  [name];  need RNFA for the case   WRIST SURGERY Left 11/21/2016   plates and screws inserted    Family History  Adopted: Yes  Problem Relation Age of Onset   Lung cancer Father        deceased 24   Breast cancer Mother 105       currently 68   Colon cancer Mother    ADD / ADHD Son    ADD / ADHD Son    Early death Maternal Aunt    Breast cancer Maternal Aunt 34       deceased 29   Breast cancer Maternal Grandmother    Depression Daughter    Depression Daughter    Prostate cancer Paternal Uncle    Stroke Paternal Uncle    Leukemia Paternal Aunt  Breast cancer Paternal Grandmother    Cancer Maternal Uncle     Social History   Tobacco Use   Smoking status: Every Day    Current packs/day: 1.00    Average packs/day: 1 pack/day for 30.0 years (30.0 ttl pk-yrs)    Types: Cigarettes   Smokeless tobacco: Never  Substance Use Topics   Alcohol use: Not Currently    Alcohol/week: 0.0 standard drinks of alcohol     Current Outpatient Medications:    buPROPion (WELLBUTRIN XL) 300 MG 24 hr tablet, Take 1 tablet (300 mg total) by mouth daily., Disp: 90 tablet, Rfl: 1   cariprazine (VRAYLAR) 1.5 MG capsule, Take 1 capsule (1.5 mg total) by mouth daily., Disp: 90 capsule, Rfl: 1   cloNIDine (CATAPRES - DOSED IN MG/24 HR) 0.3  mg/24hr patch, Place 1 patch (0.3 mg total) onto the skin once a week. Friday, Disp: 12 patch, Rfl: 3   cloNIDine (CATAPRES) 0.1 MG tablet, Take 1 tablet (0.1 mg total) by mouth at bedtime., Disp: 90 tablet, Rfl: 1   cyclobenzaprine (FLEXERIL) 5 MG tablet, Take 5 mg by mouth 3 (three) times daily as needed for muscle spasms., Disp: , Rfl:    docusate sodium (COLACE) 100 MG capsule, Take 100 mg by mouth 2 (two) times daily., Disp: , Rfl:    DULoxetine (CYMBALTA) 60 MG capsule, TAKE 1 CAPSULE BY MOUTH DAILY, Disp: 90 capsule, Rfl: 0   furosemide (LASIX) 20 MG tablet, Take 1 tablet (20 mg total) by mouth daily., Disp: 30 tablet, Rfl: 0   KLOR-CON M20 20 MEQ tablet, TAKE 1 TABLET BY MOUTH EVERY DAY, Disp: 90 tablet, Rfl: 1   LINZESS 145 MCG CAPS capsule, Take 145 mcg by mouth daily., Disp: , Rfl:    LORazepam (ATIVAN) 0.5 MG tablet, Take 1 tablet (0.5 mg total) by mouth every 12 (twelve) hours as needed for anxiety., Disp: 60 tablet, Rfl: 0   Magnesium Cl-Calcium Carbonate (SLOW MAGNESIUM/CALCIUM) 70-117 MG TBEC, Take 1 tablet by mouth 2 (two) times daily., Disp: 180 tablet, Rfl: 1   meclizine (ANTIVERT) 25 MG tablet, 1 pill at night as needed; if dizziness not improved can take one pill every 8 hours as needed/tolerated., Disp: 30 tablet, Rfl: 0   Multiple Vitamin (MULTIVITAMIN WITH MINERALS) TABS tablet, Take 1 tablet by mouth daily., Disp: , Rfl:    nystatin (MYCOSTATIN/NYSTOP) powder, Apply 1 Application topically 3 (three) times daily., Disp: 60 g, Rfl: 0   omeprazole (PRILOSEC) 20 MG capsule, TAKE 1 CAPSULE BY MOUTH DAILY, Disp: 90 capsule, Rfl: 0   ondansetron (ZOFRAN-ODT) 8 MG disintegrating tablet, Take 1 tablet (8 mg total) by mouth every 8 (eight) hours as needed for nausea or vomiting., Disp: 90 tablet, Rfl: 3   oxyCODONE (OXY IR/ROXICODONE) 5 MG immediate release tablet, Take 1 tablet (5 mg total) by mouth every 6 (six) hours as needed for severe pain (pain score 7-10)., Disp: 75 tablet, Rfl:  0   Pegfilgrastim-cbqv (UDENYCA) 6 MG/0.6ML SOAJ, Inject 6 mg into the skin every 28 (twenty-eight) days., Disp: 1.2 mL, Rfl: 0   polyethylene glycol powder (GLYCOLAX/MIRALAX) 17 GM/SCOOP powder, Take 0.5 Containers by mouth daily as needed for mild constipation or moderate constipation., Disp: , Rfl:    pregabalin (LYRICA) 150 MG capsule, Take 150 mg by mouth 3 (three) times daily., Disp: , Rfl:    prochlorperazine (COMPAZINE) 10 MG tablet, Take 1 tablet (10 mg total) by mouth every 6 (six) hours as needed for nausea or vomiting.,  Disp: 40 tablet, Rfl: 3   rivaroxaban (XARELTO) 20 MG TABS tablet, Take 1 tablet (20 mg total) by mouth daily with supper., Disp: 90 tablet, Rfl: 1   rosuvastatin (CRESTOR) 5 MG tablet, TAKE 1 TABLET BY MOUTH ONCE  DAILY (IN PLACE OF ATORVASTATIN), Disp: 90 tablet, Rfl: 0   senna (SENOKOT) 8.6 MG tablet, Take 1 tablet by mouth daily as needed for constipation. (Patient not taking: Reported on 11/17/2023), Disp: , Rfl:    SUMAtriptan (IMITREX) 100 MG tablet, Take 1 tablet (100 mg total) by mouth every 2 (two) hours as needed for migraine. May repeat in 2 hours if headache persists or recurs., Disp: 10 tablet, Rfl: 2   Tiotropium Bromide Monohydrate (SPIRIVA RESPIMAT) 2.5 MCG/ACT AERS, Inhale 2 puffs into the lungs daily., Disp: 12 g, Rfl: 1   traZODone (DESYREL) 150 MG tablet, Take 1 tablet (150 mg total) by mouth at bedtime as needed for sleep., Disp: 90 tablet, Rfl: 1   valACYclovir (VALTREX) 1000 MG tablet, Take 1 tablet (1,000 mg total) by mouth 2 (two) times daily as needed., Disp: 6 tablet, Rfl: 2   zolpidem (AMBIEN) 5 MG tablet, Take 1 tablet (5 mg total) by mouth at bedtime as needed for sleep., Disp: 30 tablet, Rfl: 0 No current facility-administered medications for this visit.  Facility-Administered Medications Ordered in Other Visits:    heparin lock flush 100 unit/mL, 250 Units, Intracatheter, Once PRN, Earna Coder, MD   ondansetron (ZOFRAN) 4 MG/2ML  injection, , , ,    sodium chloride flush (NS) 0.9 % injection 10 mL, 10 mL, Intravenous, Once, Donneta Romberg, Worthy Flank, MD  Allergies  Allergen Reactions   Hydroxyzine Hcl Other (See Comments)    Makes her loopy/jittery    I personally reviewed {Reviewed:14835} with the patient/caregiver today.   ROS  ***  Objective  There were no vitals filed for this visit.  There is no height or weight on file to calculate BMI.  Physical Exam ***  Recent Results (from the past 2160 hour(s))  CMP (Cancer Center only)     Status: Abnormal   Collection Time: 09/12/23 12:59 PM  Result Value Ref Range   Sodium 137 135 - 145 mmol/L   Potassium 3.6 3.5 - 5.1 mmol/L   Chloride 105 98 - 111 mmol/L   CO2 26 22 - 32 mmol/L   Glucose, Bld 104 (H) 70 - 99 mg/dL    Comment: Glucose reference range applies only to samples taken after fasting for at least 8 hours.   BUN 11 6 - 20 mg/dL   Creatinine 2.84 1.32 - 1.00 mg/dL   Calcium 8.9 8.9 - 44.0 mg/dL   Total Protein 6.6 6.5 - 8.1 g/dL   Albumin 3.6 3.5 - 5.0 g/dL   AST 19 15 - 41 U/L   ALT 13 0 - 44 U/L   Alkaline Phosphatase 66 38 - 126 U/L   Total Bilirubin 0.2 (L) 0.3 - 1.2 mg/dL   GFR, Estimated >10 >27 mL/min    Comment: (NOTE) Calculated using the CKD-EPI Creatinine Equation (2021)    Anion gap 6 5 - 15    Comment: Performed at [facility], [address] [place], [place] [address]  CBC with Differential (Cancer Center Only)     Status: Abnormal   Collection Time: 09/12/23 12:59 PM  Result Value Ref Range   WBC Count 3.8 (L) 4.0 - 10.5 K/uL   RBC 2.98 (L) 3.87 - 5.11 MIL/uL   Hemoglobin  10.8 (L) 12.0 - 15.0 g/dL   HCT 16.1 (L) 09.6 - 04.5 %   MCV 113.1 (H) 80.0 - 100.0 fL   MCH 36.2 (H) 26.0 - 34.0 pg   MCHC 32.0 30.0 - 36.0 g/dL   RDW 40.9 (H) 81.1 - 91.4 %   Platelet Count 104 (L) 150 - 400 K/uL    Comment: SPECIMEN CHECKED FOR CLOTS   nRBC 0.0 0.0 - 0.2 %   Neutrophils Relative % 57 %   Neutro Abs 2.2 1.7 - 7.7  K/uL   Lymphocytes Relative 30 %   Lymphs Abs 1.1 0.7 - 4.0 K/uL   Monocytes Relative 10 %   Monocytes Absolute 0.4 0.1 - 1.0 K/uL   Eosinophils Relative 1 %   Eosinophils Absolute 0.0 0.0 - 0.5 K/uL   Basophils Relative 1 %   Basophils Absolute 0.0 0.0 - 0.1 K/uL   Immature Granulocytes 1 %   Abs Immature Granulocytes 0.03 0.00 - 0.07 K/uL    Comment: Performed at [facility], [address] [place], [place] [address]  CA 125     Status: None   Collection Time: 09/12/23  1:52 PM  Result Value Ref Range   Cancer Antigen (CA) 125 13.5 0.0 - 38.1 U/mL    Comment: (NOTE) Roche [employer]ostics Electrochemiluminescence Immunoassay (ECLIA) Values obtained with different assay methods or kits cannot be used interchangeably.  Results cannot be interpreted as absolute evidence of the presence or absence of malignant disease. Performed At: [facility] [address] [place], [place] [id number] Jolene Schimke MD QI:[id number]   ECHOCARDIOGRAM COMPLETE     Status: None   Collection Time: 10/03/23  9:42 AM  Result Value Ref Range   Ao pk vel 1.60 m/s   AV Area VTI 2.31 cm2   AR max vel 2.19 cm2   AV Mean grad 5.5 mmHg   AV Peak grad 10.2 mmHg   S' Lateral 3.30 cm   AV Area mean vel 2.33 cm2   Area-P 1/2 4.44 cm2   Est EF 55 - 60%   CMP (Cancer Center only)     Status: Abnormal   Collection Time: 10/09/23  8:45 AM  Result Value Ref Range   Sodium 137 135 - 145 mmol/L   Potassium 3.2 (L) 3.5 - 5.1 mmol/L   Chloride 106 98 - 111 mmol/L   CO2 26 22 - 32 mmol/L   Glucose, Bld 146 (H) 70 - 99 mg/dL    Comment: Glucose reference range applies only to samples taken after fasting for at least 8 hours.   BUN 9 6 - 20 mg/dL   Creatinine 3.24 4.01 - 1.00 mg/dL   Calcium 9.4 8.9 - 02.7 mg/dL   Total Protein 6.8 6.5 - 8.1 g/dL   Albumin 3.7 3.5 - 5.0 g/dL   AST 23 15 - 41 U/L   ALT 13 0 - 44 U/L   Alkaline Phosphatase 66 38 - 126 U/L   Total Bilirubin 0.6 0.3 - 1.2 mg/dL    GFR, Estimated >25 >36 mL/min    Comment: (NOTE) Calculated using the CKD-EPI Creatinine Equation (2021)    Anion gap 5 5 - 15    Comment: Performed at [facility], [address] [place], [place] [address]  CBC with Differential (Cancer Center Only)     Status: Abnormal   Collection Time: 10/09/23  8:45 AM  Result Value Ref Range   WBC Count 2.3 (L) 4.0 - 10.5 K/uL  RBC 2.48 (L) 3.87 - 5.11 MIL/uL   Hemoglobin 9.0 (L) 12.0 - 15.0 g/dL   HCT 75.6 (L) 43.3 - 29.5 %   MCV 110.9 (H) 80.0 - 100.0 fL   MCH 36.3 (H) 26.0 - 34.0 pg   MCHC 32.7 30.0 - 36.0 g/dL   RDW 18.8 (H) 41.6 - 60.6 %   Platelet Count 85 (L) 150 - 400 K/uL   nRBC 0.0 0.0 - 0.2 %   Neutrophils Relative % 30 %   Neutro Abs 0.7 (L) 1.7 - 7.7 K/uL   Lymphocytes Relative 53 %   Lymphs Abs 1.2 0.7 - 4.0 K/uL   Monocytes Relative 15 %   Monocytes Absolute 0.3 0.1 - 1.0 K/uL   Eosinophils Relative 1 %   Eosinophils Absolute 0.0 0.0 - 0.5 K/uL   Basophils Relative 1 %   Basophils Absolute 0.0 0.0 - 0.1 K/uL   Immature Granulocytes 0 %   Abs Immature Granulocytes 0.01 0.00 - 0.07 K/uL    Comment: Performed at [facility], [address] [place], [place] [address]  CA 125     Status: None   Collection Time: 10/09/23  8:45 AM  Result Value Ref Range   Cancer Antigen (CA) 125 13.6 0.0 - 38.1 U/mL    Comment: (NOTE) Roche [employer] Electrochemiluminescence Immunoassay (ECLIA) Values obtained with different assay methods or kits cannot be used interchangeably.  Results cannot be interpreted as absolute evidence of the presence or absence of malignant disease. Performed At: [facility] [address] [place], [place] [id number] Jolene Schimke MD DU:[id number]   CMP (Cancer Center only)     Status: Abnormal   Collection Time: 10/23/23  9:41 AM  Result Value Ref Range   Sodium 137 135 - 145 mmol/L   Potassium 3.5 3.5 - 5.1 mmol/L   Chloride 104 98 - 111 mmol/L   CO2 27 22 - 32 mmol/L    Glucose, Bld 107 (H) 70 - 99 mg/dL    Comment: Glucose reference range applies only to samples taken after fasting for at least 8 hours.   BUN 12 6 - 20 mg/dL   Creatinine 3.76 2.83 - 1.00 mg/dL   Calcium 9.0 8.9 - 15.1 mg/dL   Total Protein 6.7 6.5 - 8.1 g/dL   Albumin 3.7 3.5 - 5.0 g/dL   AST 17 15 - 41 U/L   ALT 11 0 - 44 U/L   Alkaline Phosphatase 68 38 - 126 U/L   Total Bilirubin 0.4 0.3 - 1.2 mg/dL   GFR, Estimated >76 >16 mL/min    Comment: (NOTE) Calculated using the CKD-EPI Creatinine Equation (2021)    Anion gap 6 5 - 15    Comment: Performed at [facility], [address] [place], [place] [address]  CBC with Differential (Cancer Center Only)     Status: Abnormal   Collection Time: 10/23/23  9:41 AM  Result Value Ref Range   WBC Count 3.3 (L) 4.0 - 10.5 K/uL   RBC 2.95 (L) 3.87 - 5.11 MIL/uL   Hemoglobin 10.8 (L) 12.0 - 15.0 g/dL   HCT 06.2 (L) 69.4 - 85.4 %   MCV 110.2 (H) 80.0 - 100.0 fL   MCH 36.6 (H) 26.0 - 34.0 pg   MCHC 33.2 30.0 - 36.0 g/dL   RDW 62.7 (H) 03.5 - 00.9 %   Platelet Count 125 (L) 150 - 400 K/uL   nRBC 0.0 0.0 - 0.2 %   Neutrophils Relative %  48 %   Neutro Abs 1.6 (L) 1.7 - 7.7 K/uL   Lymphocytes Relative 35 %   Lymphs Abs 1.2 0.7 - 4.0 K/uL   Monocytes Relative 13 %   Monocytes Absolute 0.4 0.1 - 1.0 K/uL   Eosinophils Relative 2 %   Eosinophils Absolute 0.1 0.0 - 0.5 K/uL   Basophils Relative 1 %   Basophils Absolute 0.0 0.0 - 0.1 K/uL   Immature Granulocytes 1 %   Abs Immature Granulocytes 0.02 0.00 - 0.07 K/uL    Comment: Performed at [facility], [address] [place], [place] [address]  CA 125     Status: None   Collection Time: 10/23/23  9:48 AM  Result Value Ref Range   Cancer Antigen (CA) 125 13.4 0.0 - 38.1 U/mL    Comment: (NOTE) Roche [employer] Electrochemiluminescence Immunoassay (ECLIA) Values obtained with different assay methods or kits cannot be used interchangeably.  Results cannot be interpreted as  absolute evidence of the presence or absence of malignant disease. Performed At: [facility] [address] [place], [place] [id number] Jolene Schimke MD QQ:[id number]   Lipase, blood     Status: None   Collection Time: 10/29/23  8:29 PM  Result Value Ref Range   Lipase 25 11 - 51 U/L    Comment: Performed at [facility], [address] [place], [place] [address]  Comprehensive metabolic panel     Status: Abnormal   Collection Time: 10/29/23  8:29 PM  Result Value Ref Range   Sodium 138 135 - 145 mmol/L   Potassium 3.0 (L) 3.5 - 5.1 mmol/L   Chloride 101 98 - 111 mmol/L   CO2 25 22 - 32 mmol/L   Glucose, Bld 105 (H) 70 - 99 mg/dL    Comment: Glucose reference range applies only to samples taken after fasting for at least 8 hours.   BUN 13 6 - 20 mg/dL   Creatinine, Ser 1.88 0.44 - 1.00 mg/dL   Calcium 8.5 (L) 8.9 - 10.3 mg/dL   Total Protein 6.5 6.5 - 8.1 g/dL   Albumin 3.8 3.5 - 5.0 g/dL   AST 23 15 - 41 U/L   ALT 16 0 - 44 U/L   Alkaline Phosphatase 100 38 - 126 U/L   Total Bilirubin 0.7 0.3 - 1.2 mg/dL   GFR, Estimated >41 >66 mL/min    Comment: (NOTE) Calculated using the CKD-EPI Creatinine Equation (2021)    Anion gap 12 5 - 15    Comment: Performed at [facility], [address] [place], [place] [address]  CBC     Status: Abnormal   Collection Time: 10/29/23  8:29 PM  Result Value Ref Range   WBC 8.5 4.0 - 10.5 K/uL   RBC 2.80 (L) 3.87 - 5.11 MIL/uL   Hemoglobin 10.0 (L) 12.0 - 15.0 g/dL   HCT 60.1 (L) 09.3 - 23.5 %   MCV 107.5 (H) 80.0 - 100.0 fL   MCH 35.7 (H) 26.0 - 34.0 pg   MCHC 33.2 30.0 - 36.0 g/dL   RDW 57.3 22.0 - 25.4 %   Platelets 86 (L) 150 - 400 K/uL    Comment: SPECIMEN CHECKED FOR CLOTS Immature Platelet Fraction may be clinically indicated, consider ordering this additional test [id number] REPEATED TO VERIFY    nRBC 0.0 0.0 - 0.2 %    Comment: Performed at [facility], [address] [place], [place] [address]  Urinalysis, Routine w reflex microscopic -Urine, Clean Catch     Status: Abnormal   Collection Time: 10/29/23 11:08 PM  Result Value Ref Range   Color, Urine YELLOW YELLOW   APPearance CLOUDY (A) CLEAR   Specific Gravity, Urine 1.020 1.005 - 1.030   pH 6.0 5.0 - 8.0   Glucose, UA NEGATIVE NEGATIVE mg/dL   Hgb urine dipstick NEGATIVE NEGATIVE   Bilirubin Urine NEGATIVE NEGATIVE   Ketones, ur NEGATIVE NEGATIVE mg/dL   Protein, ur NEGATIVE NEGATIVE mg/dL   Nitrite NEGATIVE NEGATIVE   Leukocytes,Ua NEGATIVE NEGATIVE    Comment: Microscopic not done on urines with negative protein, blood, leukocytes, nitrite, or glucose < 500 mg/dL. Performed at [facility], [address] [place], [place] [address]   Pregnancy, urine     Status: None   Collection Time: 10/29/23 11:08 PM  Result Value Ref Range   Preg Test, Ur NEGATIVE NEGATIVE    Comment:        THE SENSITIVITY OF THIS METHODOLOGY IS >25 mIU/mL. Performed at [facility], [address] [place], [place] [address]   CBC with Differential (Cancer Center Only)     Status: Abnormal   Collection Time: 11/17/23  2:34 PM  Result Value Ref Range   WBC Count 2.6 (L) 4.0 - 10.5 K/uL   RBC 2.78 (L) 3.87 - 5.11 MIL/uL   Hemoglobin 10.0 (L) 12.0 - 15.0 g/dL   HCT 29.5 (L) 62.1 - 30.8 %   MCV 105.0 (H) 80.0 - 100.0 fL   MCH 36.0 (H) 26.0 - 34.0 pg   MCHC 34.2 30.0 - 36.0 g/dL   RDW 65.7 (H) 84.6 - 96.2 %   Platelet Count 49 (L) 150 - 400 K/uL    Comment: SPECIMEN CHECKED FOR CLOTS Immature Platelet Fraction may be clinically indicated, consider ordering this additional test [id number]    nRBC 0.0 0.0 - 0.2 %   Neutrophils Relative % 39 %   Neutro Abs 1.0 (L) 1.7 - 7.7 K/uL   Lymphocytes Relative 42 %   Lymphs Abs 1.1 0.7 - 4.0 K/uL   Monocytes Relative 17 %   Monocytes Absolute 0.4 0.1 - 1.0 K/uL   Eosinophils Relative 1 %   Eosinophils Absolute 0.0 0.0 - 0.5 K/uL   Basophils Relative 1 %    Basophils Absolute 0.0 0.0 - 0.1 K/uL   Immature Granulocytes 0 %   Abs Immature Granulocytes 0.01 0.00 - 0.07 K/uL    Comment: Performed at [facility], [address] [place], [place] [address]  CA 125     Status: None   Collection Time: 11/17/23  2:34 PM  Result Value Ref Range   Cancer Antigen (CA) 125 15.2 0.0 - 38.1 U/mL    Comment: (NOTE) Roche [employer] Electrochemiluminescence Immunoassay (ECLIA) Values obtained with different assay methods or kits cannot be used interchangeably.  Results cannot be interpreted as absolute evidence of the presence or absence of malignant disease. Performed At: [facility] [address] [place], [place] [id number] Jolene Schimke MD IH:[id number]   CMP (Cancer Center only)     Status: Abnormal   Collection Time: 11/17/23  2:34 PM  Result Value Ref Range   Sodium 139 135 - 145 mmol/L   Potassium 3.3 (L) 3.5 - 5.1 mmol/L   Chloride 102 98 - 111 mmol/L   CO2 23 22 - 32 mmol/L   Glucose, Bld 154 (H) 70 - 99 mg/dL  Comment: Glucose reference range applies only to samples taken after fasting for at least 8 hours.   BUN 9 6 - 20 mg/dL   Creatinine 8.11 9.14 - 1.00 mg/dL   Calcium 9.3 8.9 - 78.2 mg/dL   Total Protein 7.1 6.5 - 8.1 g/dL   Albumin 4.3 3.5 - 5.0 g/dL   AST 29 15 - 41 U/L   ALT 19 0 - 44 U/L   Alkaline Phosphatase 71 38 - 126 U/L   Total Bilirubin 0.9 <1.2 mg/dL   GFR, Estimated >95 >62 mL/min    Comment: (NOTE) Calculated using the CKD-EPI Creatinine Equation (2021)    Anion gap 14 5 - 15    Comment: Performed at [facility], [address] [place], [place] [address]    Diabetic Foot Exam: Diabetic Foot Exam - Simple   No data filed    ***  PHQ2/9:    08/09/2023   11:15 AM 01/19/2023   11:11 AM 01/04/2023   11:47 AM 12/12/2022    4:32 PM 09/28/2022   11:06 AM  Depression screen PHQ 2/9  Decreased Interest 2 2 2  0 3  Down, Depressed, Hopeless 2 3 2 3 3   PHQ - 2 Score 4 5 4 3 6    Altered sleeping 3 3 3  0 2  Tired, decreased energy 3 2 2 3 3   Change in appetite 3 0 0 0   Feeling bad or failure about yourself  3 2 2 3 3   Trouble concentrating 3 2 2 3 2   Moving slowly or fidgety/restless 0 0 0 0   Suicidal thoughts 0 0 0 0   PHQ-9 Score 19 14 13 12 16   Difficult doing work/chores  Very difficult Somewhat difficult Somewhat difficult     phq 9 is {gen pos [id number]} ***  Fall Risk:    08/09/2023   11:14 AM 01/19/2023   11:02 AM 09/28/2022   10:57 AM 08/05/2022    9:18 AM 06/17/2022    2:31 PM  Fall Risk   Falls in the past year? 0 0 0 0 0  Number falls in past yr: 0  0 0 0  Injury with Fall? 0  0 0 0  Risk for fall due to : No Fall Risks No Fall Risks No Fall Risks No Fall Risks No Fall Risks  Follow up Falls prevention discussed Falls prevention discussed;Education provided Falls prevention discussed Falls prevention discussed;Education provided Falls prevention discussed;Education provided   ***   Functional Status Survey:   ***   Assessment & Plan  *** There are no diagnoses linked to this encounter.

## 2023-12-08 ENCOUNTER — Inpatient Hospital Stay: Payer: Medicare HMO | Attending: Internal Medicine

## 2023-12-08 ENCOUNTER — Encounter: Payer: Self-pay | Admitting: Internal Medicine

## 2023-12-08 ENCOUNTER — Other Ambulatory Visit: Payer: Self-pay | Admitting: Hospice and Palliative Medicine

## 2023-12-08 ENCOUNTER — Inpatient Hospital Stay (HOSPITAL_BASED_OUTPATIENT_CLINIC_OR_DEPARTMENT_OTHER): Payer: Medicare HMO | Admitting: Internal Medicine

## 2023-12-08 DIAGNOSIS — G4700 Insomnia, unspecified: Secondary | ICD-10-CM | POA: Insufficient documentation

## 2023-12-08 DIAGNOSIS — Z800 Family history of malignant neoplasm of digestive organs: Secondary | ICD-10-CM | POA: Diagnosis not present

## 2023-12-08 DIAGNOSIS — C482 Malignant neoplasm of peritoneum, unspecified: Secondary | ICD-10-CM

## 2023-12-08 DIAGNOSIS — Z803 Family history of malignant neoplasm of breast: Secondary | ICD-10-CM | POA: Insufficient documentation

## 2023-12-08 DIAGNOSIS — Z801 Family history of malignant neoplasm of trachea, bronchus and lung: Secondary | ICD-10-CM | POA: Insufficient documentation

## 2023-12-08 DIAGNOSIS — F419 Anxiety disorder, unspecified: Secondary | ICD-10-CM | POA: Diagnosis not present

## 2023-12-08 DIAGNOSIS — Z79899 Other long term (current) drug therapy: Secondary | ICD-10-CM | POA: Diagnosis not present

## 2023-12-08 DIAGNOSIS — Z86718 Personal history of other venous thrombosis and embolism: Secondary | ICD-10-CM | POA: Insufficient documentation

## 2023-12-08 DIAGNOSIS — F17210 Nicotine dependence, cigarettes, uncomplicated: Secondary | ICD-10-CM | POA: Diagnosis not present

## 2023-12-08 DIAGNOSIS — Z95828 Presence of other vascular implants and grafts: Secondary | ICD-10-CM

## 2023-12-08 DIAGNOSIS — Z7901 Long term (current) use of anticoagulants: Secondary | ICD-10-CM | POA: Insufficient documentation

## 2023-12-08 LAB — CBC WITH DIFFERENTIAL (CANCER CENTER ONLY)
Abs Immature Granulocytes: 0.01 10*3/uL (ref 0.00–0.07)
Basophils Absolute: 0 10*3/uL (ref 0.0–0.1)
Basophils Relative: 1 %
Eosinophils Absolute: 0 10*3/uL (ref 0.0–0.5)
Eosinophils Relative: 1 %
HCT: 35.1 % — ABNORMAL LOW (ref 36.0–46.0)
Hemoglobin: 11.9 g/dL — ABNORMAL LOW (ref 12.0–15.0)
Immature Granulocytes: 0 %
Lymphocytes Relative: 31 %
Lymphs Abs: 1.2 10*3/uL (ref 0.7–4.0)
MCH: 35.7 pg — ABNORMAL HIGH (ref 26.0–34.0)
MCHC: 33.9 g/dL (ref 30.0–36.0)
MCV: 105.4 fL — ABNORMAL HIGH (ref 80.0–100.0)
Monocytes Absolute: 0.4 10*3/uL (ref 0.1–1.0)
Monocytes Relative: 10 %
Neutro Abs: 2.2 10*3/uL (ref 1.7–7.7)
Neutrophils Relative %: 57 %
Platelet Count: 123 10*3/uL — ABNORMAL LOW (ref 150–400)
RBC: 3.33 MIL/uL — ABNORMAL LOW (ref 3.87–5.11)
RDW: 15.5 % (ref 11.5–15.5)
WBC Count: 3.8 10*3/uL — ABNORMAL LOW (ref 4.0–10.5)
nRBC: 0 % (ref 0.0–0.2)

## 2023-12-08 LAB — CMP (CANCER CENTER ONLY)
ALT: 13 U/L (ref 0–44)
AST: 20 U/L (ref 15–41)
Albumin: 4.2 g/dL (ref 3.5–5.0)
Alkaline Phosphatase: 63 U/L (ref 38–126)
Anion gap: 8 (ref 5–15)
BUN: 9 mg/dL (ref 6–20)
CO2: 24 mmol/L (ref 22–32)
Calcium: 9.4 mg/dL (ref 8.9–10.3)
Chloride: 105 mmol/L (ref 98–111)
Creatinine: 0.78 mg/dL (ref 0.44–1.00)
GFR, Estimated: >60 mL/min (ref >60)
Glucose, Bld: 133 mg/dL — ABNORMAL HIGH (ref 70–99)
Potassium: 3.3 mmol/L — ABNORMAL LOW (ref 3.5–5.1)
Sodium: 137 mmol/L (ref 135–145)
Total Bilirubin: 0.8 mg/dL (ref <1.2)
Total Protein: 7.2 g/dL (ref 6.5–8.1)

## 2023-12-08 MED ORDER — SODIUM CHLORIDE 0.9% FLUSH
10.0000 mL | Freq: Once | INTRAVENOUS | Status: AC
  Administered 2023-12-08: 10 mL via INTRAVENOUS
  Filled 2023-12-08: qty 10

## 2023-12-08 MED ORDER — TRAZODONE HCL 300 MG PO TABS: 300.0000 mg | ORAL_TABLET | Freq: Every evening | ORAL | 1 refills | Status: DC | PRN

## 2023-12-08 MED ORDER — OXYCODONE HCL 5 MG PO TABS: 5.0000 mg | ORAL_TABLET | Freq: Four times a day (QID) | ORAL | 0 refills | Status: DC | PRN

## 2023-12-08 MED ORDER — HEPARIN SOD (PORK) LOCK FLUSH 100 UNIT/ML IV SOLN
500.0000 [IU] | Freq: Once | INTRAVENOUS | Status: AC
  Administered 2023-12-08: 500 [IU] via INTRAVENOUS
  Filled 2023-12-08: qty 5

## 2023-12-08 NOTE — Assessment & Plan Note (Addendum)
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  NOV 18th, CT CAP-   over all STABLE- No significant interval change; Minimal areas of peritoneal nodularity are similar to previous to slightly decreased. No developing ascites or lymph node enlargement. No pleural effusion seen today.    # Patient's cancer markers have improved.  Recommend a chemo break-given slow recovery of the bone marrow. Also discussed with gynecology oncology.  10/03/2023- Left ventricular ejection fraction, by estimation, is 55 to 60%- stable. Will re-evaluate in 2 months.   # BIL LE swelling-dependent edema-low clinical suspicion for CHF. S/p  lasix 20 mg/day- stable  # Gum bleeding-grade 1- 2; on Xarelto -improved currently continue salt/backing soda rinses. Stable.   # Nausea/vomiting- G-1-2-continue Zofran and Compazine- Stable.   # constipation- miralax prn- Stable.   # Abdominal discomfort-combination of underlying malignancy and also ventral hernia.  Discussed unfortunately given her malignancy ventral hernia surgery/repair unlikely.  Recommend abdominal binder.  # Hypocalcemia-pending-Vit D levels; Hypokalemia- severe -3.3  -potassium.  Continue  K-Dur twice daily; Continue slow Mag. Stable.   # Ongoing MSK joints-February 2024 -bone scan negative for malignancy.  On oxycodone /Dr. Borders- Stable.   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 No new or acute finding.  Currently on xarelto-  Stable.     # PN G-2/back pain- on Lyrica 50 mg TID/Cymbalta- Stable.    # Anxiety/Insomnia--social stressors-continue Cymbalta [at 60 mg/day]; continue trazadone; but increase to 300 mg at bedtime-add Ambien 5 mg- new script ordered;  discussed re: cerlua care referral.  # Hot flashes: on Clonidine patch- on  Friday weekly; and clonidine pill 0.1 qhs. Stable.   #IV access/Mediport-currently s/p TPA- port flush.  Stable.   # DISPOSITION:  # cerlua care referral re: anxiety/depression # follow up in 2 months- MD; port-labs- cbc/c,p; ca 125; No chemo- Dr.B

## 2023-12-08 NOTE — Progress Notes (Signed)
Trouble sleeping, would like to increase trazodone, zolpidem not helping.  Appetite 25% normal, eats once a day, no supplement drinks.  Constipation, linzess is helping.
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
## 2023-12-10 LAB — CA 125: Cancer Antigen (CA) 125: 15.9 U/mL (ref 0.0–38.1)

## 2023-12-11 ENCOUNTER — Ambulatory Visit: Payer: Medicare HMO | Admitting: Family Medicine

## 2023-12-13 DIAGNOSIS — Z1509 Genetic susceptibility to other malignant neoplasm: Secondary | ICD-10-CM | POA: Diagnosis not present

## 2023-12-13 DIAGNOSIS — Z1501 Genetic susceptibility to malignant neoplasm of breast: Secondary | ICD-10-CM | POA: Diagnosis not present

## 2023-12-13 DIAGNOSIS — R928 Other abnormal and inconclusive findings on diagnostic imaging of breast: Secondary | ICD-10-CM | POA: Diagnosis not present

## 2023-12-13 DIAGNOSIS — R92323 Mammographic fibroglandular density, bilateral breasts: Secondary | ICD-10-CM | POA: Diagnosis not present

## 2023-12-13 LAB — HM MAMMOGRAPHY

## 2023-12-14 ENCOUNTER — Telehealth: Payer: Self-pay | Admitting: Hospice and Palliative Medicine

## 2023-12-14 NOTE — Telephone Encounter (Signed)
Per secure chat from [employer] "would it be possible to move my telephone visits on 12/23 to another week? I am worried that Monday might be busy with [facility] visits and with limited provider coverage."   I left a vm for pt stating that the appt has been changed, the date/time and that it is available to view in [place] as well and if pt needs to change this to call us back.

## 2023-12-18 ENCOUNTER — Inpatient Hospital Stay: Payer: Medicare HMO | Admitting: Hospice and Palliative Medicine

## 2023-12-18 ENCOUNTER — Encounter: Payer: Self-pay | Admitting: Internal Medicine

## 2023-12-18 NOTE — Progress Notes (Signed)
Patient consented and enrolled for [facility] services 12/15/23. Patient scheduled initial behavioral health evaluation for 12/19/23.

## 2023-12-19 DIAGNOSIS — C482 Malignant neoplasm of peritoneum, unspecified: Secondary | ICD-10-CM | POA: Diagnosis not present

## 2023-12-19 DIAGNOSIS — F332 Major depressive disorder, recurrent severe without psychotic features: Secondary | ICD-10-CM | POA: Diagnosis not present

## 2023-12-26 NOTE — Progress Notes (Signed)
 [facility] - Final Intake Summary  [name] a.k.a. [name] (1971-03-24) is a 52 y/o white female with stage IV primary peritoneal adenocarcinoma first dx'd in 2020, referred by Dr. Rennie @ [facility]. [name] has dx of recurrent mild MDD in chart at time of CC referral. She is open to psychiatric medication recommendations.  Assessments:  [name] scored PHQ-9=21 (severe), reporting anhedonia, low mood/energy/appetite, trouble falling & staying asleep, feeling bad about herself, trouble concentrating, psychomotor retardation, and passive suicidal ideation once every 1-2 month(s). Safety Concerns: [name] scored CSSR-S=Low-Risk, reporting a history of passive SI w/o specificity, method, plan, or intention. No prior attempts reported. [name] scored GAD-7=15 (severe), reporting every symptom of anxiety listed on the assessment. Primary areas of worry include her health, finances, and family.  Psych Tx/Hx: [name] takes Vraylar  1.5mg , Trazodone  300mg , Bupropion  XL 300mg , Cymbalta  60mg , Lyrica  150mg  for back pain, and Clonidine  pill & patch for night sweats/hot flashes. She recently tried Ambien  for sleep w/o benefit and has taken Ativan  in the past. Her psych meds are prescribed by PCP.   Psychosocial Hx: [name] is single and on disability, living at home with her youngest daughter who is pregnant and not working. [name] has 4 children total.  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

## 2023-12-27 ENCOUNTER — Encounter: Payer: Self-pay | Admitting: Internal Medicine

## 2023-12-29 ENCOUNTER — Inpatient Hospital Stay: Payer: Medicare HMO | Attending: Internal Medicine | Admitting: Hospice and Palliative Medicine

## 2023-12-29 ENCOUNTER — Encounter: Payer: Self-pay | Admitting: Internal Medicine

## 2023-12-29 DIAGNOSIS — C482 Malignant neoplasm of peritoneum, unspecified: Secondary | ICD-10-CM

## 2023-12-29 NOTE — Progress Notes (Signed)
VM left. Will reschedule. 

## 2023-12-31 ENCOUNTER — Other Ambulatory Visit: Payer: Self-pay | Admitting: Hospice and Palliative Medicine

## 2024-01-01 ENCOUNTER — Encounter: Payer: Self-pay | Admitting: Internal Medicine

## 2024-01-01 MED ORDER — OXYCODONE HCL 5 MG PO TABS: 5.0000 mg | ORAL_TABLET | Freq: Four times a day (QID) | ORAL | 0 refills | Status: DC | PRN

## 2024-01-05 DIAGNOSIS — F332 Major depressive disorder, recurrent severe without psychotic features: Secondary | ICD-10-CM | POA: Diagnosis not present

## 2024-01-05 DIAGNOSIS — C482 Malignant neoplasm of peritoneum, unspecified: Secondary | ICD-10-CM | POA: Diagnosis not present

## 2024-01-16 ENCOUNTER — Other Ambulatory Visit: Payer: Self-pay | Admitting: Hospice and Palliative Medicine

## 2024-01-16 MED ORDER — OXYCODONE HCL 5 MG PO TABS: 5.0000 mg | ORAL_TABLET | Freq: Four times a day (QID) | ORAL | 0 refills | Status: DC | PRN

## 2024-01-28 ENCOUNTER — Encounter: Payer: Self-pay | Admitting: Internal Medicine

## 2024-02-02 ENCOUNTER — Encounter: Payer: Self-pay | Admitting: Internal Medicine

## 2024-02-07 ENCOUNTER — Other Ambulatory Visit: Payer: Self-pay | Admitting: Hospice and Palliative Medicine

## 2024-02-07 MED ORDER — OXYCODONE HCL 5 MG PO TABS: 5.0000 mg | ORAL_TABLET | Freq: Four times a day (QID) | ORAL | 0 refills | Status: DC | PRN

## 2024-02-09 ENCOUNTER — Inpatient Hospital Stay: Payer: 59 | Attending: Internal Medicine | Admitting: Hospice and Palliative Medicine

## 2024-02-09 DIAGNOSIS — F17210 Nicotine dependence, cigarettes, uncomplicated: Secondary | ICD-10-CM | POA: Insufficient documentation

## 2024-02-09 DIAGNOSIS — Z79899 Other long term (current) drug therapy: Secondary | ICD-10-CM | POA: Insufficient documentation

## 2024-02-09 DIAGNOSIS — C482 Malignant neoplasm of peritoneum, unspecified: Secondary | ICD-10-CM | POA: Insufficient documentation

## 2024-02-09 DIAGNOSIS — Z800 Family history of malignant neoplasm of digestive organs: Secondary | ICD-10-CM | POA: Insufficient documentation

## 2024-02-09 DIAGNOSIS — Z801 Family history of malignant neoplasm of trachea, bronchus and lung: Secondary | ICD-10-CM | POA: Insufficient documentation

## 2024-02-09 DIAGNOSIS — Z515 Encounter for palliative care: Secondary | ICD-10-CM

## 2024-02-09 DIAGNOSIS — E876 Hypokalemia: Secondary | ICD-10-CM | POA: Insufficient documentation

## 2024-02-09 DIAGNOSIS — Z9221 Personal history of antineoplastic chemotherapy: Secondary | ICD-10-CM | POA: Insufficient documentation

## 2024-02-09 DIAGNOSIS — K5900 Constipation, unspecified: Secondary | ICD-10-CM | POA: Insufficient documentation

## 2024-02-09 DIAGNOSIS — F32A Depression, unspecified: Secondary | ICD-10-CM | POA: Insufficient documentation

## 2024-02-09 DIAGNOSIS — Z803 Family history of malignant neoplasm of breast: Secondary | ICD-10-CM | POA: Insufficient documentation

## 2024-02-09 NOTE — Progress Notes (Signed)
Voicemail left.  Will reschedule

## 2024-02-14 DIAGNOSIS — K5901 Slow transit constipation: Secondary | ICD-10-CM | POA: Diagnosis not present

## 2024-02-14 DIAGNOSIS — N3281 Overactive bladder: Secondary | ICD-10-CM | POA: Diagnosis not present

## 2024-02-18 ENCOUNTER — Emergency Department (HOSPITAL_BASED_OUTPATIENT_CLINIC_OR_DEPARTMENT_OTHER): Payer: 59

## 2024-02-18 ENCOUNTER — Encounter (HOSPITAL_BASED_OUTPATIENT_CLINIC_OR_DEPARTMENT_OTHER): Payer: Self-pay | Admitting: Emergency Medicine

## 2024-02-18 ENCOUNTER — Other Ambulatory Visit: Payer: Self-pay

## 2024-02-18 ENCOUNTER — Emergency Department (HOSPITAL_BASED_OUTPATIENT_CLINIC_OR_DEPARTMENT_OTHER)
Admission: EM | Admit: 2024-02-18 | Discharge: 2024-02-19 | Disposition: A | Discharge status: Home / Self Care | Payer: 59 | Attending: Emergency Medicine | Admitting: Emergency Medicine

## 2024-02-18 DIAGNOSIS — J90 Pleural effusion, not elsewhere classified: Secondary | ICD-10-CM | POA: Diagnosis not present

## 2024-02-18 DIAGNOSIS — R29898 Other symptoms and signs involving the musculoskeletal system: Secondary | ICD-10-CM | POA: Diagnosis not present

## 2024-02-18 DIAGNOSIS — M542 Cervicalgia: Secondary | ICD-10-CM | POA: Insufficient documentation

## 2024-02-18 DIAGNOSIS — C799 Secondary malignant neoplasm of unspecified site: Secondary | ICD-10-CM | POA: Diagnosis not present

## 2024-02-18 DIAGNOSIS — R079 Chest pain, unspecified: Secondary | ICD-10-CM | POA: Insufficient documentation

## 2024-02-18 DIAGNOSIS — R519 Headache, unspecified: Secondary | ICD-10-CM | POA: Diagnosis not present

## 2024-02-18 DIAGNOSIS — Z7901 Long term (current) use of anticoagulants: Secondary | ICD-10-CM | POA: Diagnosis not present

## 2024-02-18 DIAGNOSIS — M25511 Pain in right shoulder: Secondary | ICD-10-CM | POA: Insufficient documentation

## 2024-02-18 DIAGNOSIS — T148XXA Other injury of unspecified body region, initial encounter: Secondary | ICD-10-CM

## 2024-02-18 DIAGNOSIS — E876 Hypokalemia: Secondary | ICD-10-CM

## 2024-02-18 DIAGNOSIS — R002 Palpitations: Secondary | ICD-10-CM

## 2024-02-18 DIAGNOSIS — R531 Weakness: Secondary | ICD-10-CM | POA: Diagnosis not present

## 2024-02-18 DIAGNOSIS — S134XXA Sprain of ligaments of cervical spine, initial encounter: Secondary | ICD-10-CM | POA: Diagnosis not present

## 2024-02-18 DIAGNOSIS — J9811 Atelectasis: Secondary | ICD-10-CM | POA: Diagnosis not present

## 2024-02-18 LAB — COMPREHENSIVE METABOLIC PANEL
ALT: 16 U/L (ref 0–44)
AST: 27 U/L (ref 15–41)
Albumin: 3.7 g/dL (ref 3.5–5.0)
Alkaline Phosphatase: 91 U/L (ref 38–126)
Anion gap: 13 (ref 5–15)
BUN: 17 mg/dL (ref 6–20)
CO2: 19 mmol/L — ABNORMAL LOW (ref 22–32)
Calcium: 8.7 mg/dL — ABNORMAL LOW (ref 8.9–10.3)
Chloride: 105 mmol/L (ref 98–111)
Creatinine, Ser: 0.79 mg/dL (ref 0.44–1.00)
GFR, Estimated: >60 mL/min (ref >60)
Glucose, Bld: 144 mg/dL — ABNORMAL HIGH (ref 70–99)
Potassium: 3.2 mmol/L — ABNORMAL LOW (ref 3.5–5.1)
Sodium: 137 mmol/L (ref 135–145)
Total Bilirubin: 0.3 mg/dL (ref 0.0–1.2)
Total Protein: 6.9 g/dL (ref 6.5–8.1)

## 2024-02-18 LAB — CBC
HCT: 36.8 % (ref 36.0–46.0)
Hemoglobin: 12.2 g/dL (ref 12.0–15.0)
MCH: 33.4 pg (ref 26.0–34.0)
MCHC: 33.2 g/dL (ref 30.0–36.0)
MCV: 100.8 fL — ABNORMAL HIGH (ref 80.0–100.0)
Platelets: 150 10*3/uL (ref 150–400)
RBC: 3.65 MIL/uL — ABNORMAL LOW (ref 3.87–5.11)
RDW: 12.9 % (ref 11.5–15.5)
WBC: 6.3 10*3/uL (ref 4.0–10.5)
nRBC: 0 % (ref 0.0–0.2)

## 2024-02-18 LAB — TROPONIN I (HIGH SENSITIVITY): Troponin I (High Sensitivity): 4 ng/L (ref <18)

## 2024-02-18 MED ORDER — MORPHINE SULFATE (PF) 4 MG/ML IV SOLN
4.0000 mg | Freq: Once | INTRAVENOUS | Status: AC
  Administered 2024-02-18: 4 mg via INTRAVENOUS
  Filled 2024-02-18: qty 1

## 2024-02-18 MED ORDER — POTASSIUM CHLORIDE CRYS ER 20 MEQ PO TBCR
40.0000 meq | EXTENDED_RELEASE_TABLET | Freq: Once | ORAL | Status: AC
  Administered 2024-02-18: 40 meq via ORAL
  Filled 2024-02-18: qty 2

## 2024-02-18 MED ORDER — SODIUM CHLORIDE 0.9 % IV BOLUS
1000.0000 mL | Freq: Once | INTRAVENOUS | Status: AC
  Administered 2024-02-18: 1000 mL via INTRAVENOUS

## 2024-02-18 MED ORDER — IOHEXOL 350 MG/ML SOLN
75.0000 mL | Freq: Once | INTRAVENOUS | Status: AC | PRN
  Administered 2024-02-19: 75 mL via INTRAVENOUS

## 2024-02-18 MED ORDER — SODIUM CHLORIDE 0.9 % IV SOLN
25.0000 mg | Freq: Once | INTRAVENOUS | Status: AC
  Administered 2024-02-18: 25 mg via INTRAVENOUS
  Filled 2024-02-18: qty 1

## 2024-02-18 MED ORDER — PROMETHAZINE HCL 25 MG/ML IJ SOLN
INTRAMUSCULAR | Status: AC
  Administered 2024-02-18: 25 mg
  Filled 2024-02-18: qty 1

## 2024-02-18 NOTE — ED Provider Notes (Signed)
 [facility] Provider Note   CSN: [id number] Arrival date & time: 02/18/24  2216     History  Chief Complaint  Patient presents with   Chest Pain    [name] is a 53 y.o. female past medical history seen for depression, GERD, obesity, peritoneal carcinomatosis not currently undergoing chemo, reports that she has been on chemo for 4 years but is currently on a break who presents with concern for feeling off, middle chest pain, heart feels like it is "flopping around".  She endorses some nausea, headache, right sided neck pain, occasional weakness in right upper extremity secondary to pain.  She denies any new vision changes, double vision, numbness, tingling.   Chest Pain      Home Medications Prior to Admission medications   Medication Sig Start Date End Date Taking? Authorizing Provider  buPROPion (WELLBUTRIN XL) 300 MG 24 hr tablet Take 1 tablet (300 mg total) by mouth daily. 08/09/23   [name], MD  cariprazine (VRAYLAR) 1.5 MG capsule Take 1 capsule (1.5 mg total) by mouth daily. 08/09/23   [name], MD  cloNIDine (CATAPRES - DOSED IN MG/24 HR) 0.3 mg/24hr patch Place 1 patch (0.3 mg total) onto the skin once a week. Friday 07/31/23   Earna Coder, MD  cloNIDine (CATAPRES) 0.1 MG tablet Take 1 tablet (0.1 mg total) by mouth at bedtime. 07/31/23   Earna Coder, MD  cyclobenzaprine (FLEXERIL) 5 MG tablet Take 5 mg by mouth 3 (three) times daily as needed for muscle spasms. 10/17/22   [provider]  docusate sodium (COLACE) 100 MG capsule Take 100 mg by mouth 2 (two) times daily.    [provider]  DULoxetine (CYMBALTA) 60 MG capsule TAKE 1 CAPSULE BY MOUTH DAILY 09/25/23   [name], MD  furosemide (LASIX) 20 MG tablet Take 1 tablet (20 mg total) by mouth daily. 08/29/23   Earna Coder, MD  KLOR-CON M20 20 MEQ tablet TAKE 1 TABLET BY MOUTH EVERY DAY 09/28/23   Earna Coder, MD  LINZESS 145 MCG CAPS capsule Take 145 mcg by mouth daily.    [provider]  LORazepam (ATIVAN) 0.5 MG tablet Take 1 tablet (0.5 mg total) by mouth every 12 (twelve) hours as needed for anxiety. 10/21/22   Earna Coder, MD  Magnesium Cl-Calcium Carbonate (SLOW MAGNESIUM/CALCIUM) 70-117 MG TBEC Take 1 tablet by mouth 2 (two) times daily. 08/03/23   Earna Coder, MD  meclizine (ANTIVERT) 25 MG tablet 1 pill at night as needed; if dizziness not improved can take one pill every 8 hours as needed/tolerated. 01/10/23   Earna Coder, MD  Multiple Vitamin (MULTIVITAMIN WITH MINERALS) TABS tablet Take 1 tablet by mouth daily.    [provider]  nystatin (MYCOSTATIN/NYSTOP) powder Apply 1 Application topically 3 (three) times daily. 09/28/22   [name], MD  omeprazole (PRILOSEC) 20 MG capsule TAKE 1 CAPSULE BY MOUTH DAILY 09/25/23   Carlynn Purl, Danna Hefty, MD  ondansetron (ZOFRAN-ODT) 8 MG disintegrating tablet Take 1 tablet (8 mg total) by mouth every 8 (eight) hours as needed for nausea or vomiting. 07/31/23   Earna Coder, MD  oxyCODONE (OXY IR/ROXICODONE) 5 MG immediate release tablet Take 1 tablet (5 mg total) by mouth every 6 (six) hours as needed for severe pain (pain score 7-10). 02/07/24   Borders, [name], NP  Pegfilgrastim-cbqv (UDENYCA) 6 MG/0.6ML SOAJ Inject 6 mg into the skin every  28 (twenty-eight) days. 11/06/23   Earna Coder, MD  polyethylene glycol powder (GLYCOLAX/MIRALAX) 17 GM/SCOOP powder Take 0.5 Containers by mouth daily as needed for mild constipation or moderate constipation. 02/17/20   [provider]  pregabalin (LYRICA) 150 MG capsule Take 150 mg by mouth 3 (three) times daily.    E, Tanner Cyprus, PA-C  prochlorperazine (COMPAZINE) 10 MG tablet Take 1 tablet (10 mg total) by mouth every 6 (six) hours as needed for nausea or vomiting. 07/31/23   Earna Coder, MD  rivaroxaban (XARELTO) 20 MG TABS  tablet Take 1 tablet (20 mg total) by mouth daily with supper. 07/31/23   Earna Coder, MD  rosuvastatin (CRESTOR) 5 MG tablet TAKE 1 TABLET BY MOUTH ONCE  DAILY (IN PLACE OF ATORVASTATIN) 09/25/23   Sowles, Danna Hefty, MD  senna (SENOKOT) 8.6 MG tablet Take 1 tablet by mouth daily as needed for constipation.    [provider]  SUMAtriptan (IMITREX) 100 MG tablet Take 1 tablet (100 mg total) by mouth every 2 (two) hours as needed for migraine. May repeat in 2 hours if headache persists or recurs. 08/09/23   [name], MD  Tiotropium Bromide Monohydrate (SPIRIVA RESPIMAT) 2.5 MCG/ACT AERS Inhale 2 puffs into the lungs daily. 08/09/23   [name], MD  traZODone (DESYREL) 300 MG tablet Take 1 tablet (300 mg total) by mouth at bedtime as needed for sleep. 12/08/23   Earna Coder, MD  valACYclovir (VALTREX) 1000 MG tablet Take 1 tablet (1,000 mg total) by mouth 2 (two) times daily as needed. 09/27/23   [name], MD  zolpidem (AMBIEN) 5 MG tablet Take 1 tablet (5 mg total) by mouth at bedtime as needed for sleep. 11/17/23   Earna Coder, MD  ranitidine (ZANTAC) 300 MG tablet Take 1 tablet (300 mg total) by mouth daily as needed for heartburn. 04/18/18 10/24/19  [name], MD      Allergies    Hydroxyzine hcl    Review of Systems   Review of Systems  Cardiovascular:  Positive for chest pain.  All other systems reviewed and are negative.   Physical Exam Updated Vital Signs BP (!) 133/90 (BP Location: Right Arm)   Pulse (!) 107   Temp 98.1 F (36.7 C)   Resp 16   Ht 5\' 3"  (1.6 m)   Wt 95.7 kg   LMP 06/19/2017   SpO2 99%   BMI 37.38 kg/m  Physical Exam Vitals and nursing note reviewed.  Constitutional:      General: She is not in acute distress.    Appearance: Normal appearance.  HENT:     Head: Normocephalic and atraumatic.  Eyes:     General:        Right eye: No discharge.        Left eye: No discharge.  Cardiovascular:      Rate and Rhythm: Regular rhythm. Tachycardia present.     Heart sounds: No murmur heard.    No friction rub. No gallop.  Pulmonary:     Effort: Pulmonary effort is normal.     Breath sounds: Normal breath sounds.  Abdominal:     General: Bowel sounds are normal.     Palpations: Abdomen is soft.  Musculoskeletal:     Comments: Some tenderness to palpation of the right shoulder, right cervical paraspinous muscles, no midline spinal tenderness.  Skin:    General: Skin is warm and dry.     Capillary Refill: Capillary refill takes  less than 2 seconds.  Neurological:     Mental Status: She is alert and oriented to person, place, and time.     Comments: Cranial nerves II through XII grossly intact.  Intact finger-nose, intact heel-to-shin.  Romberg negative, gait normal.  Alert and oriented x3.  Moves all 4 limbs spontaneously, normal coordination.  No pronator drift.  Intact strength 5 out of 5 bilateral upper and lower extremities.  Psychiatric:        Mood and Affect: Mood normal.        Behavior: Behavior normal.     ED Results / Procedures / Treatments   Labs (all labs ordered are listed, but only abnormal results are displayed) Labs Reviewed  CBC - Abnormal; Notable for the following components:      Result Value   RBC 3.65 (*)    MCV 100.8 (*)    All other components within normal limits  COMPREHENSIVE METABOLIC PANEL - Abnormal; Notable for the following components:   Potassium 3.2 (*)    CO2 19 (*)    Glucose, Bld 144 (*)    Calcium 8.7 (*)    All other components within normal limits  TROPONIN I (HIGH SENSITIVITY)    EKG EKG Interpretation Date/Time:  Sunday February 18 2024 22:26:22 EST Ventricular Rate:  112 PR Interval:  138 QRS Duration:  82 QT Interval:  343 QTC Calculation: 469 R Axis:   -18  Text Interpretation: Sinus tachycardia Borderline left axis deviation Abnormal R-wave progression, late transition , similar to prior Minimal ST depression, lateral  leads Since last tracing rate faster Confirmed by Linwood Dibbles [phone]) on 02/18/2024 10:30:17 PM  Radiology No results found.  Procedures Procedures    Medications Ordered in ED Medications  potassium chloride SA (KLOR-CON M) CR tablet 40 mEq (has no administration in time range)  iohexol (OMNIPAQUE) 350 MG/ML injection 75 mL (has no administration in time range)  morphine (PF) 4 MG/ML injection 4 mg (4 mg Intravenous Given 02/18/24 2309)  sodium chloride 0.9 % bolus 1,000 mL (1,000 mLs Intravenous New Bag/Given 02/18/24 2259)  promethazine (PHENERGAN) 25 mg in sodium chloride 0.9 % 50 mL IVPB (25 mg Intravenous New Bag/Given 02/18/24 2309)  promethazine (PHENERGAN) 25 MG/ML injection (25 mg  Given 02/18/24 2311)    ED Course/ Medical Decision Making/ A&P                                 Medical Decision Making  This patient is a 53 y.o. female  who presents to the ED for concern of chest pain, nausea, headache, neck pain.   Differential diagnoses prior to evaluation: The emergent differential diagnosis includes, but is not limited to,  ACS, AAS, PE, Mallory-Weiss, Boerhaave's, Pneumonia, acute bronchitis, asthma or COPD exacerbation, anxiety, MSK pain or traumatic injury to the chest, acid reflux versus other, Stroke, increased ICP, meningitis, CVA, intracranial tumor, venous sinus thrombosis, migraine, cluster headache, hypertension, drug related, head injury, tension headache, sinusitis, dental abscess, otitis media, TMJ . This is not an exhaustive differential.   Past Medical History / Co-morbidities / Social History: depression, GERD, obesity, peritoneal carcinomatosis   Additional history: Chart reviewed. Pertinent results include: Revealed outpatient oncology visits  Physical Exam: Physical exam performed. The pertinent findings include: Some tachycardia, no wheezing, rhonchi, stridor, rales.  Some tenderness to palpation of the right shoulder, right neck.  No focal neurologic  deficits.  Lab Tests/Imaging studies:  I personally interpreted labs/imaging and the pertinent results include: CBC unremarkable, CMP notable for mild hypokalemia, potassium 3.2, mild bicarb deficit, CO2 19.  Initial troponin 4.  Pending CT of the head, neck, and PE study given her cancer history despite taking an anticoagulant  Cardiac monitoring: EKG obtained and interpreted by myself and attending physician which shows: sinus tachycardia, some st depression but not significantly changed from recent previous   Medications: I ordered medication including morphine, fluid bolus, Phenergan for pain, nausea, suspected dehydration.  I have reviewed the patients home medicines and have made adjustments as needed.   11:40 PM Care of [name] transferred to Dr. Eudelia Bunch at the end of my shift as the patient will require reassessment once labs/imaging have resulted. Patient presentation, ED course, and plan of care discussed with review of all pertinent labs and imaging. Please see his/her note for further details regarding further ED course and disposition. Plan at time of handoff is reassess after imaging studies, delta troponin, pain medicine and assess patient's response to treatment, probably stable for discharge if no acute cardiac or pulmonary abnormality.. This may be altered or completely changed at the discretion of the oncoming team pending results of further workup.  Final Clinical Impression(s) / ED Diagnoses Final diagnoses:  None    Rx / DC Orders ED Discharge Orders     None         [place] 02/18/24 2340    Linwood Dibbles, MD 02/19/24 [phone]

## 2024-02-18 NOTE — ED Triage Notes (Signed)
 Pt c/o middle CP and heart feels like it's "flopping around" x 1 wk; multiple other sxs this week, including nausea, HA, pain and weakness in RUE (none at this time)

## 2024-02-19 DIAGNOSIS — R079 Chest pain, unspecified: Secondary | ICD-10-CM | POA: Diagnosis not present

## 2024-02-19 LAB — TROPONIN I (HIGH SENSITIVITY): Troponin I (High Sensitivity): 4 ng/L (ref <18)

## 2024-02-19 NOTE — ED Provider Notes (Signed)
 I assumed care of this patient from previous provider.  Please see their note for further details of history, exam, and MDM.   Briefly patient is a 53 y.o. female who presented here for palpitations, right-sided neck pain and headache.  CT scans currently pending.  Initial workup was reassuring other than mild hypokalemia which was repleted with oral potassium.  Currently awaiting CT scan and delta troponin.  Delta troponin negative.  CT scans of the chest ruled out PE, pneumonia, pneumothorax.  CT of the head and cervical spine negative.  On reassessment, patient reported feeling significantly better and feels she is able to go home.  The patient appears reasonably screened and/or stabilized for discharge and I doubt any other medical condition or other [facility] requiring further screening, evaluation, or treatment in the ED at this time. I have discussed the findings, Dx and Tx plan with the patient/family who expressed understanding and agree(s) with the plan. Discharge instructions discussed at length. The patient/family was given strict return precautions who verbalized understanding of the instructions. No further questions at time of discharge.  Disposition: Discharge  Condition: Good  ED Discharge Orders     None        Follow Up: [name], MD [address] [place] [address] [place] [address] [phone]  Call  to schedule an appointment for close follow up         Cozy Veale, Amadeo Garnet, MD 02/19/24 [phone]

## 2024-02-20 ENCOUNTER — Inpatient Hospital Stay: Payer: 59

## 2024-02-20 ENCOUNTER — Encounter: Payer: Self-pay | Admitting: Internal Medicine

## 2024-02-20 ENCOUNTER — Inpatient Hospital Stay (HOSPITAL_BASED_OUTPATIENT_CLINIC_OR_DEPARTMENT_OTHER): Payer: 59 | Admitting: Internal Medicine

## 2024-02-20 VITALS — BP 123/88 | HR 91 | Temp 97.8°F | Ht 63.0 in | Wt 212.2 lb

## 2024-02-20 DIAGNOSIS — F32A Depression, unspecified: Secondary | ICD-10-CM | POA: Diagnosis not present

## 2024-02-20 DIAGNOSIS — Z95828 Presence of other vascular implants and grafts: Secondary | ICD-10-CM

## 2024-02-20 DIAGNOSIS — Z801 Family history of malignant neoplasm of trachea, bronchus and lung: Secondary | ICD-10-CM | POA: Diagnosis not present

## 2024-02-20 DIAGNOSIS — F17210 Nicotine dependence, cigarettes, uncomplicated: Secondary | ICD-10-CM | POA: Diagnosis not present

## 2024-02-20 DIAGNOSIS — C482 Malignant neoplasm of peritoneum, unspecified: Secondary | ICD-10-CM

## 2024-02-20 DIAGNOSIS — Z800 Family history of malignant neoplasm of digestive organs: Secondary | ICD-10-CM | POA: Diagnosis not present

## 2024-02-20 DIAGNOSIS — E876 Hypokalemia: Secondary | ICD-10-CM | POA: Diagnosis not present

## 2024-02-20 DIAGNOSIS — K5900 Constipation, unspecified: Secondary | ICD-10-CM | POA: Diagnosis not present

## 2024-02-20 DIAGNOSIS — Z9221 Personal history of antineoplastic chemotherapy: Secondary | ICD-10-CM | POA: Diagnosis not present

## 2024-02-20 DIAGNOSIS — Z79899 Other long term (current) drug therapy: Secondary | ICD-10-CM | POA: Diagnosis not present

## 2024-02-20 DIAGNOSIS — Z803 Family history of malignant neoplasm of breast: Secondary | ICD-10-CM | POA: Diagnosis not present

## 2024-02-20 LAB — CMP (CANCER CENTER ONLY)
ALT: 16 U/L (ref 0–44)
AST: 18 U/L (ref 15–41)
Albumin: 3.7 g/dL (ref 3.5–5.0)
Alkaline Phosphatase: 73 U/L (ref 38–126)
Anion gap: 7 (ref 5–15)
BUN: 10 mg/dL (ref 6–20)
CO2: 26 mmol/L (ref 22–32)
Calcium: 9.2 mg/dL (ref 8.9–10.3)
Chloride: 105 mmol/L (ref 98–111)
Creatinine: 0.86 mg/dL (ref 0.44–1.00)
GFR, Estimated: >60 mL/min (ref >60)
Glucose, Bld: 97 mg/dL (ref 70–99)
Potassium: 3.9 mmol/L (ref 3.5–5.1)
Sodium: 138 mmol/L (ref 135–145)
Total Bilirubin: 0.6 mg/dL (ref 0.0–1.2)
Total Protein: 7 g/dL (ref 6.5–8.1)

## 2024-02-20 LAB — CBC WITH DIFFERENTIAL (CANCER CENTER ONLY)
Abs Immature Granulocytes: 0.02 10*3/uL (ref 0.00–0.07)
Basophils Absolute: 0 10*3/uL (ref 0.0–0.1)
Basophils Relative: 1 %
Eosinophils Absolute: 0.1 10*3/uL (ref 0.0–0.5)
Eosinophils Relative: 2 %
HCT: 37.8 % (ref 36.0–46.0)
Hemoglobin: 12.5 g/dL (ref 12.0–15.0)
Immature Granulocytes: 0 %
Lymphocytes Relative: 30 %
Lymphs Abs: 1.4 10*3/uL (ref 0.7–4.0)
MCH: 32.9 pg (ref 26.0–34.0)
MCHC: 33.1 g/dL (ref 30.0–36.0)
MCV: 99.5 fL (ref 80.0–100.0)
Monocytes Absolute: 0.4 10*3/uL (ref 0.1–1.0)
Monocytes Relative: 8 %
Neutro Abs: 2.8 10*3/uL (ref 1.7–7.7)
Neutrophils Relative %: 59 %
Platelet Count: 157 10*3/uL (ref 150–400)
RBC: 3.8 MIL/uL — ABNORMAL LOW (ref 3.87–5.11)
RDW: 12.8 % (ref 11.5–15.5)
WBC Count: 4.7 10*3/uL (ref 4.0–10.5)
nRBC: 0 % (ref 0.0–0.2)

## 2024-02-20 MED ORDER — POTASSIUM CHLORIDE CRYS ER 20 MEQ PO TBCR: 20.0000 meq | EXTENDED_RELEASE_TABLET | Freq: Two times a day (BID) | ORAL | 1 refills | Status: DC

## 2024-02-20 MED ORDER — HEPARIN SOD (PORK) LOCK FLUSH 100 UNIT/ML IV SOLN
500.0000 [IU] | Freq: Once | INTRAVENOUS | Status: AC
  Administered 2024-02-20: 500 [IU] via INTRAVENOUS
  Filled 2024-02-20: qty 5

## 2024-02-20 NOTE — Progress Notes (Signed)
 Seen in [facility] [place] for pain in rt shoulder and arm, potassium level was low. Does she need to adjust rx for potassium?
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
## 2024-02-20 NOTE — Assessment & Plan Note (Addendum)
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  NOV 18th, CT CAP-   over all STABLE- No significant interval change; Minimal areas of peritoneal nodularity are similar to previous to slightly decreased. No developing ascites or lymph node enlargement. No pleural effusion seen today.    # Patient's cancer markers have improved.  Recommend a chemo break-given slow recovery of the bone marrow. Also discussed with gynecology oncology.  10/03/2023- Left ventricular ejection fraction, by estimation, is 55 to 60%- stable. Will re-evaluate in 2 months.   # Hypokalemia- reocmmdn Kdur BID- refiled.   # constipation- miralax prn- Stable.   # Abdominal discomfort-combination of underlying malignancy and also ventral hernia.  Discussed unfortunately given her malignancy ventral hernia surgery/repair unlikely.  Recommend abdominal binder.  # Hypocalcemia-pending-Vit D levels; Hypokalemia- severe -3.3  -potassium.  Continue  K-Dur twice daily; Continue slow Mag. Stable.   # Ongoing MSK joints-February 2024 -bone scan negative for malignancy.  On oxycodone /Dr. Borders- Stable.   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 No new or acute finding.  Currently on xarelto-  Stable.     # PN G-2/back pain- on Lyrica 50 mg TID/Cymbalta- Stable.    # Depression/ Anxiety/Insomnia-status post evaluation with February 2025 psychiatry/cerula care.  Continue Cymbalta [at 60 mg/day]; continue trazadone    300 mg at bedtime; vraylar-/wellbutrin  s/p cerula care. Stable.   # Hot flashes: on Clonidine patch- on Friday weekly; and clonidine pill 0.1 qhs. Stable.   #IV access/Mediport-currently s/p TPA- port flush.  Stable.   # DISPOSITION:  # follow up in  6 weeks- - MD; port-labs- cbc/c,p; ca 125; No  chemo; CT AP prior- Dr.B

## 2024-02-20 NOTE — Addendum Note (Signed)
 Addended by: Clydia Llano on: 02/20/2024 04:33 PM   Modules accepted: Orders

## 2024-02-21 ENCOUNTER — Telehealth: Payer: Self-pay | Admitting: *Deleted

## 2024-02-21 DIAGNOSIS — M170 Bilateral primary osteoarthritis of knee: Secondary | ICD-10-CM | POA: Diagnosis not present

## 2024-02-21 DIAGNOSIS — M75101 Unspecified rotator cuff tear or rupture of right shoulder, not specified as traumatic: Secondary | ICD-10-CM | POA: Insufficient documentation

## 2024-02-21 DIAGNOSIS — G5600 Carpal tunnel syndrome, unspecified upper limb: Secondary | ICD-10-CM | POA: Insufficient documentation

## 2024-02-21 DIAGNOSIS — M5020 Other cervical disc displacement, unspecified cervical region: Secondary | ICD-10-CM | POA: Insufficient documentation

## 2024-02-21 LAB — CA 125: Cancer Antigen (CA) 125: 805 U/mL — ABNORMAL HIGH (ref 0.0–38.1)

## 2024-02-21 NOTE — Telephone Encounter (Signed)
 The patient states that she was going to get scan in April but with the CA/125 jumped up so much she feels the MD would move the scan up

## 2024-02-22 ENCOUNTER — Telehealth: Payer: Self-pay | Admitting: *Deleted

## 2024-02-22 NOTE — Telephone Encounter (Signed)
 The staff wants the portacath accessed for them to [place] for contrast. Also we will need the time slot so that pt can come over and get port accessed and then the pt will have to come back over to take needle out.

## 2024-02-26 ENCOUNTER — Other Ambulatory Visit: Payer: Self-pay | Admitting: Hospice and Palliative Medicine

## 2024-02-26 MED ORDER — OXYCODONE HCL 5 MG PO TABS: 5.0000 mg | ORAL_TABLET | Freq: Four times a day (QID) | ORAL | 0 refills | Status: DC | PRN

## 2024-02-27 ENCOUNTER — Encounter: Payer: Self-pay | Admitting: Internal Medicine

## 2024-02-27 ENCOUNTER — Inpatient Hospital Stay: Payer: 59 | Attending: Internal Medicine

## 2024-02-27 ENCOUNTER — Ambulatory Visit
Admission: RE | Admit: 2024-02-27 | Discharge: 2024-02-27 | Disposition: A | Discharge status: Home / Self Care | Payer: 59 | Source: Ambulatory Visit | Attending: Internal Medicine

## 2024-02-27 DIAGNOSIS — C482 Malignant neoplasm of peritoneum, unspecified: Secondary | ICD-10-CM | POA: Diagnosis present

## 2024-02-27 DIAGNOSIS — Z452 Encounter for adjustment and management of vascular access device: Secondary | ICD-10-CM | POA: Insufficient documentation

## 2024-02-27 DIAGNOSIS — Z79899 Other long term (current) drug therapy: Secondary | ICD-10-CM | POA: Diagnosis not present

## 2024-02-27 DIAGNOSIS — Z95828 Presence of other vascular implants and grafts: Secondary | ICD-10-CM

## 2024-02-27 MED ORDER — HEPARIN SOD (PORK) LOCK FLUSH 100 UNIT/ML IV SOLN
500.0000 [IU] | Freq: Once | INTRAVENOUS | Status: AC
  Administered 2024-02-27: 500 [IU] via INTRAVENOUS
  Filled 2024-02-27: qty 5

## 2024-02-27 MED ORDER — IOPAMIDOL (ISOVUE-300) INJECTION 61%
100.0000 mL | Freq: Once | INTRAVENOUS | Status: AC | PRN
  Administered 2024-02-27: 100 mL via INTRAVENOUS

## 2024-02-27 NOTE — Addendum Note (Signed)
 Addended by: Dewaine Oats D on: 02/27/2024 02:12 PM   Modules accepted: Orders

## 2024-03-05 ENCOUNTER — Encounter: Payer: Self-pay | Admitting: *Deleted

## 2024-03-05 NOTE — Progress Notes (Signed)
 Spoke with patient regarding the CT scan for send shows progression of disease.  I have reached out to gynecology oncology regarding options of biopsy versus conventionally chemotherapy.

## 2024-03-06 ENCOUNTER — Other Ambulatory Visit: Payer: Self-pay | Admitting: Internal Medicine

## 2024-03-06 NOTE — Progress Notes (Signed)
 I spoke to Dr. Johnnette Litter regarding subsequent treatment options; recommend NGS- .   folr1 testing- if > 75% she can get mirv if 25%-75% she can get mirv + avastin LA if < 25% then gemzar  Recommend foundation 1 testing/Folate receptor  alpha-- on original tumor sample.  Danford Bad- may be you can help Marcelino Duster with above testing order.  Danford Bad- let me know the turn around time for Folate receptor alpha testing - we can expedite her chemo options.  Spoke to pt re: Alphonzo Cruise is recommend. She is in agreement.  GB

## 2024-03-07 ENCOUNTER — Telehealth: Payer: Self-pay

## 2024-03-07 ENCOUNTER — Telehealth: Payer: Self-pay | Admitting: *Deleted

## 2024-03-07 NOTE — Telephone Encounter (Addendum)
 FoundationOne CDx/FOLR1 requested on specimen [id number], collected 04/08/2020, [facility]. Financial aid application submitted.

## 2024-03-07 NOTE — Telephone Encounter (Signed)
 She states that the oxycodone does not help with the pain. She states that she is waiting for her tumor  info but was told it will take 2 weeks and and she can't take the pain

## 2024-03-08 ENCOUNTER — Encounter: Payer: Self-pay | Admitting: Internal Medicine

## 2024-03-08 ENCOUNTER — Inpatient Hospital Stay: Admitting: Nurse Practitioner

## 2024-03-08 DIAGNOSIS — G893 Neoplasm related pain (acute) (chronic): Secondary | ICD-10-CM | POA: Diagnosis not present

## 2024-03-08 MED ORDER — OXYCODONE HCL 10 MG PO TABS: 10.0000 mg | ORAL_TABLET | Freq: Four times a day (QID) | ORAL | 0 refills | Status: DC | PRN
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
## 2024-03-14 ENCOUNTER — Telehealth: Payer: Self-pay

## 2024-03-14 NOTE — Telephone Encounter (Signed)
 Contacted [facility] regarding sending slides for NGS testing. They received the block from storage today and are cutting the slides to send for testing.

## 2024-03-17 ENCOUNTER — Emergency Department (HOSPITAL_BASED_OUTPATIENT_CLINIC_OR_DEPARTMENT_OTHER)
Admission: EM | Admit: 2024-03-17 | Discharge: 2024-03-18 | Disposition: A | Discharge status: Home / Self Care | Attending: Emergency Medicine | Admitting: Emergency Medicine

## 2024-03-17 ENCOUNTER — Other Ambulatory Visit: Payer: Self-pay

## 2024-03-17 ENCOUNTER — Telehealth: Payer: Self-pay | Admitting: Oncology

## 2024-03-17 ENCOUNTER — Emergency Department (HOSPITAL_BASED_OUTPATIENT_CLINIC_OR_DEPARTMENT_OTHER)

## 2024-03-17 ENCOUNTER — Encounter (HOSPITAL_BASED_OUTPATIENT_CLINIC_OR_DEPARTMENT_OTHER): Payer: Self-pay | Admitting: Emergency Medicine

## 2024-03-17 DIAGNOSIS — Z79899 Other long term (current) drug therapy: Secondary | ICD-10-CM | POA: Insufficient documentation

## 2024-03-17 DIAGNOSIS — I10 Essential (primary) hypertension: Secondary | ICD-10-CM | POA: Insufficient documentation

## 2024-03-17 DIAGNOSIS — R109 Unspecified abdominal pain: Secondary | ICD-10-CM | POA: Diagnosis not present

## 2024-03-17 DIAGNOSIS — G893 Neoplasm related pain (acute) (chronic): Secondary | ICD-10-CM | POA: Diagnosis present

## 2024-03-17 DIAGNOSIS — C3491 Malignant neoplasm of unspecified part of right bronchus or lung: Secondary | ICD-10-CM | POA: Diagnosis not present

## 2024-03-17 DIAGNOSIS — C569 Malignant neoplasm of unspecified ovary: Secondary | ICD-10-CM | POA: Diagnosis not present

## 2024-03-17 DIAGNOSIS — Z7901 Long term (current) use of anticoagulants: Secondary | ICD-10-CM | POA: Diagnosis not present

## 2024-03-17 DIAGNOSIS — R0602 Shortness of breath: Secondary | ICD-10-CM | POA: Insufficient documentation

## 2024-03-17 LAB — CBC WITH DIFFERENTIAL/PLATELET
Abs Immature Granulocytes: 0.04 10*3/uL (ref 0.00–0.07)
Basophils Absolute: 0 10*3/uL (ref 0.0–0.1)
Basophils Relative: 1 %
Eosinophils Absolute: 0.1 10*3/uL (ref 0.0–0.5)
Eosinophils Relative: 1 %
HCT: 35.6 % — ABNORMAL LOW (ref 36.0–46.0)
Hemoglobin: 11.9 g/dL — ABNORMAL LOW (ref 12.0–15.0)
Immature Granulocytes: 1 %
Lymphocytes Relative: 25 %
Lymphs Abs: 1.7 10*3/uL (ref 0.7–4.0)
MCH: 32.6 pg (ref 26.0–34.0)
MCHC: 33.4 g/dL (ref 30.0–36.0)
MCV: 97.5 fL (ref 80.0–100.0)
Monocytes Absolute: 0.5 10*3/uL (ref 0.1–1.0)
Monocytes Relative: 7 %
Neutro Abs: 4.4 10*3/uL (ref 1.7–7.7)
Neutrophils Relative %: 65 %
Platelets: 167 10*3/uL (ref 150–400)
RBC: 3.65 MIL/uL — ABNORMAL LOW (ref 3.87–5.11)
RDW: 13.7 % (ref 11.5–15.5)
WBC: 6.7 10*3/uL (ref 4.0–10.5)
nRBC: 0 % (ref 0.0–0.2)

## 2024-03-17 MED ORDER — MORPHINE SULFATE (PF) 4 MG/ML IV SOLN
4.0000 mg | Freq: Once | INTRAVENOUS | Status: AC
  Administered 2024-03-17: 4 mg via INTRAVENOUS
  Filled 2024-03-17: qty 1

## 2024-03-17 MED ORDER — ALBUTEROL SULFATE HFA 108 (90 BASE) MCG/ACT IN AERS: 2.0000 | INHALATION_SPRAY | RESPIRATORY_TRACT | Status: DC | PRN

## 2024-03-17 NOTE — ED Triage Notes (Addendum)
 Pt via POV c/o SOB, abdominal swelling, and pain. 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 Rates diffuse abdominal pain 10/10 and appears uncomfortable in triage. Pt notes she has been informed of numerous additional tumors in abdomen and near sigmoid colon. Pt has a port. Lung sounds clear to auscultation bilaterally but is noted to be breathing with shallow, tachypneic respirations. O2 saturation 98% on room air. 0

## 2024-03-17 NOTE — Telephone Encounter (Signed)
 Patient called to report that she has progressive abdominal pain/bloating and shortness of breath, symptoms have worsen over the past week. She feels these symptoms are similar to the those when she was initially diagnosed with cancer. She has taken her oxycodone, pain is still at 10/10.  I recommend patient go to ER for further evaluation.  Suspect worse ascites, pleural effusion or PE.  Patient voices understanding of recommendation.  Cc Dr. Donneta Romberg.

## 2024-03-17 NOTE — ED Notes (Signed)
 Patient transported to x-ray. ?

## 2024-03-18 ENCOUNTER — Emergency Department (HOSPITAL_BASED_OUTPATIENT_CLINIC_OR_DEPARTMENT_OTHER)

## 2024-03-18 ENCOUNTER — Encounter (HOSPITAL_BASED_OUTPATIENT_CLINIC_OR_DEPARTMENT_OTHER): Payer: Self-pay

## 2024-03-18 DIAGNOSIS — R109 Unspecified abdominal pain: Secondary | ICD-10-CM | POA: Diagnosis present

## 2024-03-18 DIAGNOSIS — G893 Neoplasm related pain (acute) (chronic): Secondary | ICD-10-CM | POA: Diagnosis not present

## 2024-03-18 LAB — COMPREHENSIVE METABOLIC PANEL
ALT: 14 U/L (ref 0–44)
AST: 19 U/L (ref 15–41)
Albumin: 3.3 g/dL — ABNORMAL LOW (ref 3.5–5.0)
Alkaline Phosphatase: 88 U/L (ref 38–126)
Anion gap: 8 (ref 5–15)
BUN: 20 mg/dL (ref 6–20)
CO2: 25 mmol/L (ref 22–32)
Calcium: 8.6 mg/dL — ABNORMAL LOW (ref 8.9–10.3)
Chloride: 104 mmol/L (ref 98–111)
Creatinine, Ser: 0.77 mg/dL (ref 0.44–1.00)
GFR, Estimated: >60 mL/min (ref >60)
Glucose, Bld: 111 mg/dL — ABNORMAL HIGH (ref 70–99)
Potassium: 3.9 mmol/L (ref 3.5–5.1)
Sodium: 137 mmol/L (ref 135–145)
Total Bilirubin: 0.4 mg/dL (ref 0.0–1.2)
Total Protein: 6.6 g/dL (ref 6.5–8.1)

## 2024-03-18 LAB — RESP PANEL BY RT-PCR (RSV, FLU A&B, COVID)  RVPGX2
Influenza A by PCR: NEGATIVE
Influenza B by PCR: NEGATIVE
Resp Syncytial Virus by PCR: NEGATIVE
SARS Coronavirus 2 by RT PCR: NEGATIVE

## 2024-03-18 LAB — TROPONIN I (HIGH SENSITIVITY): Troponin I (High Sensitivity): <2 ng/L (ref <18)

## 2024-03-18 LAB — HCG, SERUM, QUALITATIVE: Preg, Serum: NEGATIVE

## 2024-03-18 LAB — BRAIN NATRIURETIC PEPTIDE: B Natriuretic Peptide: 103.1 pg/mL — ABNORMAL HIGH (ref 0.0–100.0)

## 2024-03-18 MED ORDER — HEPARIN SOD (PORK) LOCK FLUSH 100 UNIT/ML IV SOLN
INTRAVENOUS | Status: AC
  Administered 2024-03-18: 500 [IU]
  Filled 2024-03-18: qty 5

## 2024-03-18 MED ORDER — IOHEXOL 350 MG/ML SOLN
100.0000 mL | Freq: Once | INTRAVENOUS | Status: AC | PRN
  Administered 2024-03-18: 100 mL via INTRAVENOUS

## 2024-03-18 MED ORDER — OXYCODONE-ACETAMINOPHEN 5-325 MG PO TABS
1.0000 | ORAL_TABLET | ORAL | Status: AC
  Administered 2024-03-18: 1 via ORAL
  Filled 2024-03-18: qty 1

## 2024-03-18 MED ORDER — KETOROLAC TROMETHAMINE 30 MG/ML IJ SOLN
15.0000 mg | Freq: Once | INTRAMUSCULAR | Status: AC
  Administered 2024-03-18: 15 mg via INTRAVENOUS
  Filled 2024-03-18: qty 1

## 2024-03-18 MED ORDER — DROPERIDOL 2.5 MG/ML IJ SOLN
1.2500 mg | Freq: Once | INTRAMUSCULAR | Status: AC
  Administered 2024-03-18: 1.25 mg via INTRAVENOUS
  Filled 2024-03-18: qty 2

## 2024-03-18 NOTE — ED Provider Notes (Signed)
 [facility] Provider Note   CSN: [id number] Arrival date & time: 03/17/24  2222     History  Chief Complaint  Patient presents with   Shortness of Breath    [name] is a 53 y.o. female.  The history is provided by the patient.  Shortness of Breath Severity:  Moderate Onset quality:  Gradual Timing:  Constant Progression:  Worsening Chronicity:  Recurrent Context: not activity   Relieved by:  Nothing Worsened by:  Nothing Associated symptoms: abdominal pain   Associated symptoms: no fever and no wheezing   Associated symptoms comment:  Abdominal pain and distention  Patient with Ovarian cancer stage IV with recurrence and cancer of the bronchus who is on Xarelto presents with abdominal pain and distention and SOB.  She feels she has large volume ascites and needs a tap in order to feel better.      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 Authorizing Provider  buPROPion (WELLBUTRIN XL) 300 MG 24 hr tablet Take 1 tablet (300 mg total) by mouth daily. 08/09/23   [name], MD  cariprazine (VRAYLAR) 1.5 MG  capsule Take 1 capsule (1.5 mg total) by mouth daily. 08/09/23   [name], MD  cloNIDine (CATAPRES - DOSED IN MG/24 HR) 0.3 mg/24hr patch Place 1 patch (0.3 mg total) onto the skin once a week. Friday 07/31/23   Earna Coder, MD  cloNIDine (CATAPRES) 0.1 MG tablet Take 1 tablet (0.1 mg total) by mouth at bedtime. 07/31/23   Earna Coder, MD  cyclobenzaprine (FLEXERIL) 5 MG tablet Take 5 mg by mouth 3 (three) times daily as needed for muscle spasms. 10/17/22   [provider]  docusate sodium (COLACE) 100 MG capsule Take 100 mg by mouth 2 (two) times daily.    [provider]  DULoxetine (CYMBALTA) 60 MG capsule TAKE 1 CAPSULE BY MOUTH DAILY 09/25/23   [name], MD  furosemide (LASIX) 20 MG tablet Take 1 tablet (20 mg total) by mouth daily. 08/29/23   Earna Coder, MD  LORazepam (ATIVAN) 0.5 MG tablet Take 1 tablet (0.5 mg total) by mouth every 12 (twelve) hours as needed for anxiety. 10/21/22   Earna Coder, MD  Magnesium Cl-Calcium Carbonate (SLOW MAGNESIUM/CALCIUM) 70-117 MG TBEC Take 1 tablet by mouth 2 (two) times daily. 08/03/23   Earna Coder, MD  meclizine (ANTIVERT) 25 MG tablet 1 pill at night as needed; if dizziness not improved can take one pill every 8 hours as needed/tolerated. 01/10/23   Donneta Romberg,  Worthy Flank, MD  Multiple Vitamin (MULTIVITAMIN WITH MINERALS) TABS tablet Take 1 tablet by mouth daily.    [provider]  nystatin (MYCOSTATIN/NYSTOP) powder Apply 1 Application topically 3 (three) times daily. 09/28/22   [name], MD  omeprazole (PRILOSEC) 20 MG capsule TAKE 1 CAPSULE BY MOUTH DAILY 09/25/23   Carlynn Purl, Danna Hefty, MD  ondansetron (ZOFRAN-ODT) 8 MG disintegrating tablet Take 1 tablet (8 mg total) by mouth every 8 (eight) hours as needed for nausea or vomiting. 07/31/23   Earna Coder, MD  Oxycodone HCl 10 MG TABS Take 1 tablet (10 mg total) by mouth every 6 (six) hours as needed (for cancer  related pain). 03/08/24   Alinda Dooms, NP  Pegfilgrastim-cbqv (UDENYCA) 6 MG/0.6ML SOAJ Inject 6 mg into the skin every 28 (twenty-eight) days. 11/06/23   Earna Coder, MD  polyethylene glycol powder (GLYCOLAX/MIRALAX) 17 GM/SCOOP powder Take 0.5 Containers by mouth daily as needed for mild constipation or moderate constipation. 02/17/20   [provider]  potassium chloride SA (KLOR-CON M20) 20 MEQ tablet Take 1 tablet (20 mEq total) by mouth 2 (two) times daily. 02/20/24   Earna Coder, MD  pregabalin (LYRICA) 150 MG capsule Take 150 mg by mouth 3 (three) times daily.    E, Tanner Cyprus, PA-C  prochlorperazine (COMPAZINE) 10 MG tablet Take 1 tablet (10 mg total) by mouth every 6 (six) hours as needed for nausea or vomiting. 07/31/23   Earna Coder, MD  rivaroxaban (XARELTO) 20 MG TABS tablet Take 1 tablet (20 mg total) by mouth daily with supper. 07/31/23   Earna Coder, MD  rosuvastatin (CRESTOR) 5 MG tablet TAKE 1 TABLET BY MOUTH ONCE  DAILY (IN PLACE OF ATORVASTATIN) 09/25/23   Sowles, Danna Hefty, MD  senna (SENOKOT) 8.6 MG tablet Take 1 tablet by mouth daily as needed for constipation.    [provider]  solifenacin (VESICARE) 10 MG tablet Take 10 mg by mouth daily. 02/14/24 02/13/25  [provider]  SUMAtriptan (IMITREX) 100 MG tablet Take 1 tablet (100 mg total) by mouth every 2 (two) hours as needed for migraine. May repeat in 2 hours if headache persists or recurs. 08/09/23   [name], MD  Tiotropium Bromide Monohydrate (SPIRIVA RESPIMAT) 2.5 MCG/ACT AERS Inhale 2 puffs into the lungs daily. 08/09/23   [name], MD  traZODone (DESYREL) 300 MG tablet Take 1 tablet (300 mg total) by mouth at bedtime as needed for sleep. 12/08/23   Earna Coder, MD  valACYclovir (VALTREX) 1000 MG tablet Take 1 tablet (1,000 mg total) by mouth 2 (two) times daily as needed. 09/27/23   [name], MD  zolpidem (AMBIEN) 5 MG tablet  Take 1 tablet (5 mg total) by mouth at bedtime as needed for sleep. 11/17/23   Earna Coder, MD  ranitidine (ZANTAC) 300 MG tablet Take 1 tablet (300 mg total) by mouth daily as needed for heartburn. 04/18/18 10/24/19  [name], MD      Allergies    Hydroxyzine hcl    Review of Systems   Review of Systems  Constitutional:  Negative for fever.  Respiratory:  Positive for shortness of breath. Negative for wheezing and stridor.   Gastrointestinal:  Positive for abdominal distention and abdominal pain.  All other systems reviewed and are negative.   Physical Exam Updated Vital Signs BP 103/68   Pulse 65   Temp 97.7 F (36.5 C) (Oral)   Resp (!) 29   Ht 5'  3" (1.6 m)   Wt 98 kg   LMP 06/19/2017   SpO2 94%   BMI 38.26 kg/m  Physical Exam Vitals and nursing note reviewed.  Constitutional:      General: She is not in acute distress.    Appearance: She is well-developed.  HENT:     Head: Normocephalic and atraumatic.     Nose: Nose normal.  Eyes:     Pupils: Pupils are equal, round, and reactive to light.  Cardiovascular:     Rate and Rhythm: Normal rate and regular rhythm.     Pulses: Normal pulses.     Heart sounds: Normal heart sounds.  Pulmonary:     Effort: Pulmonary effort is normal. No respiratory distress.     Breath sounds: Normal breath sounds. No wheezing or rales.  Abdominal:     General: Bowel sounds are normal. There is distension.     Palpations: Abdomen is soft. There is no fluid wave.     Tenderness: There is no abdominal tenderness. There is no guarding or rebound. Negative signs include Murphy's sign and McBurney's sign.  Genitourinary:    Vagina: No vaginal discharge.  Musculoskeletal:        General: Normal range of motion.     Cervical back: Neck supple.  Skin:    General: Skin is warm and dry.     Capillary Refill: Capillary refill takes less than 2 seconds.     Findings: No erythema or rash.  Neurological:     General: No focal  deficit present.     Deep Tendon Reflexes: Reflexes normal.  Psychiatric:        Mood and Affect: Mood normal.     ED Results / Procedures / Treatments   Labs (all labs ordered are listed, but only abnormal results are displayed) Results for orders placed or performed during the hospital encounter of 03/17/24  Resp panel by RT-PCR (RSV, Flu A&B, Covid) Anterior Nasal Swab   Collection Time: 03/17/24 11:27 PM   Specimen: Anterior Nasal Swab  Result Value Ref Range   SARS Coronavirus 2 by RT PCR NEGATIVE NEGATIVE   Influenza A by PCR NEGATIVE NEGATIVE   Influenza B by PCR NEGATIVE NEGATIVE   Resp Syncytial Virus by PCR NEGATIVE NEGATIVE  CBC with Differential   Collection Time: 03/17/24 11:27 PM  Result Value Ref Range   WBC 6.7 4.0 - 10.5 K/uL   RBC 3.65 (L) 3.87 - 5.11 MIL/uL   Hemoglobin 11.9 (L) 12.0 - 15.0 g/dL   HCT 16.1 (L) 09.6 - 04.5 %   MCV 97.5 80.0 - 100.0 fL   MCH 32.6 26.0 - 34.0 pg   MCHC 33.4 30.0 - 36.0 g/dL   RDW 40.9 81.1 - 91.4 %   Platelets 167 150 - 400 K/uL   nRBC 0.0 0.0 - 0.2 %   Neutrophils Relative % 65 %   Neutro Abs 4.4 1.7 - 7.7 K/uL   Lymphocytes Relative 25 %   Lymphs Abs 1.7 0.7 - 4.0 K/uL   Monocytes Relative 7 %   Monocytes Absolute 0.5 0.1 - 1.0 K/uL   Eosinophils Relative 1 %   Eosinophils Absolute 0.1 0.0 - 0.5 K/uL   Basophils Relative 1 %   Basophils Absolute 0.0 0.0 - 0.1 K/uL   Immature Granulocytes 1 %   Abs Immature Granulocytes 0.04 0.00 - 0.07 K/uL  Comprehensive metabolic panel   Collection Time: 03/17/24 11:27 PM  Result Value Ref Range   Sodium  137 135 - 145 mmol/L   Potassium 3.9 3.5 - 5.1 mmol/L   Chloride 104 98 - 111 mmol/L   CO2 25 22 - 32 mmol/L   Glucose, Bld 111 (H) 70 - 99 mg/dL   BUN 20 6 - 20 mg/dL   Creatinine, Ser 4.40 0.44 - 1.00 mg/dL   Calcium 8.6 (L) 8.9 - 10.3 mg/dL   Total Protein 6.6 6.5 - 8.1 g/dL   Albumin 3.3 (L) 3.5 - 5.0 g/dL   AST 19 15 - 41 U/L   ALT 14 0 - 44 U/L   Alkaline  Phosphatase 88 38 - 126 U/L   Total Bilirubin 0.4 0.0 - 1.2 mg/dL   GFR, Estimated >10 >27 mL/min   Anion gap 8 5 - 15  Brain natriuretic peptide   Collection Time: 03/17/24 11:27 PM  Result Value Ref Range   B Natriuretic Peptide 103.1 (H) 0.0 - 100.0 pg/mL  hCG, serum, qualitative   Collection Time: 03/17/24 11:27 PM  Result Value Ref Range   Preg, Serum NEGATIVE NEGATIVE  Troponin I (High Sensitivity)   Collection Time: 03/17/24 11:27 PM  Result Value Ref Range   Troponin I (High Sensitivity) <2 <18 ng/L   *Note: Due to a large number of results and/or encounters for the requested time period, some results have not been displayed. A complete set of results can be found in Results Review.   CT ABDOMEN PELVIS W CONTRAST Result Date: 03/18/2024 CLINICAL DATA:  Polytrauma, blunt.  Stage IV ovarian cancer. EXAM: CT ABDOMEN AND PELVIS WITH CONTRAST TECHNIQUE: Multidetector CT imaging of the abdomen and pelvis was performed using the standard protocol following bolus administration of intravenous contrast. RADIATION DOSE REDUCTION: This exam was performed according to the departmental dose-optimization program which includes automated exposure control, adjustment of the mA and/or kV according to patient size and/or use of iterative reconstruction technique. CONTRAST:  OMNIPAQUE IOHEXOL 350 MG/ML SOLN COMPARISON:  CT abdomen pelvis 02/27/2024 FINDINGS: Lower chest: Please see separately dictated CT angiography chest 03/18/2024. Hepatobiliary: Not enlarged. Redemonstration of peripheral left lateral hepatic lobe mass measuring up to 2.5 cm (301:16). No laceration or subcapsular hematoma. The gallbladder is otherwise unremarkable with no radio-opaque gallstones. No biliary ductal dilatation. Pancreas: Normal pancreatic contour. No main pancreatic duct dilatation. Spleen: Not enlarged. No focal lesion. No laceration, subcapsular hematoma, or vascular injury. Adrenals/Urinary Tract: No nodularity  bilaterally. Bilateral kidneys enhance symmetrically. No hydronephrosis. No contusion, laceration, or subcapsular hematoma. No injury to the vascular structures or collecting systems. No hydroureter. The urinary bladder is unremarkable. Stomach/Bowel: No small or large bowel wall thickening or dilatation. Similar-appearing fluid density lesions along the sigmoid colon (306:66, 71). The appendix is unremarkable. Vasculature/Lymphatics: No abdominal aorta or iliac aneurysm. No active contrast extravasation or pseudoaneurysm. No abdominal, pelvic, inguinal lymphadenopathy. Reproductive: Status post hysterectomy. Other: Interval increase in small volume simple free fluid with associated peritoneal thickening most prominent along the right upper quadrant suggestive of peritoneal carcinomatosis. No pneumoperitoneum. No hemoperitoneum. No mesenteric hematoma identified. No organized fluid collection. Musculoskeletal: Redemonstration of moderate volume umbilical hernia containing short segment of small bowel and free fluid. Interval reduction of the transverse colon previously noted within the hernia. No significant soft tissue hematoma. No acute pelvic fracture. No spinal fracture. Other ports and devices: None. IMPRESSION: 1. No acute intra-abdominal, intrapelvic traumatic injury. 2. No acute fracture or traumatic malalignment of the lumbar spine. Other imaging findings of potential clinical significance: 1. Interval increase in small volume free fluid  ascites with associated peritoneal carcinomatosis. 2. Redemonstration of nodule ear D related to patient's malignancy along the lateral left hepatic lobe and sigmoid colon. 3. Redemonstration of moderate volume umbilical hernia containing short segment of small bowel and free fluid. Interval reduction of the transverse colon previously noted within the hernia. No associated bowel obstruction or findings to suggest ischemia. Please see separately dictated CT angiography  chest 03/18/2024. Electronically Signed   By: Tish Frederickson M.D.   On: 03/18/2024 01:05   CT Angio Chest PE W and/or Wo Contrast Result Date: 03/18/2024 CLINICAL DATA:  Recent syncopal episode with shortness of breath EXAM: CT ANGIOGRAPHY CHEST WITH CONTRAST TECHNIQUE: Multidetector CT imaging of the chest was performed using the standard protocol during bolus administration of intravenous contrast. Multiplanar CT image reconstructions and MIPs were obtained to evaluate the vascular anatomy. RADIATION DOSE REDUCTION: This exam was performed according to the departmental dose-optimization program which includes automated exposure control, adjustment of the mA and/or kV according to patient size and/or use of iterative reconstruction technique. CONTRAST:  OMNIPAQUE IOHEXOL 350 MG/ML SOLN COMPARISON:  Chest x-ray from the prior day. FINDINGS: Cardiovascular: Thoracic aorta shows no aneurysmal dilatation or dissection. No cardiac enlargement is seen. The pulmonary artery shows a normal branching pattern bilaterally. No filling defect to suggest pulmonary embolism is noted. Right chest wall port is noted. Mediastinum/Nodes: Thoracic inlet is within normal limits. No hilar or mediastinal adenopathy is noted. The esophagus as visualized is within normal limits. Lungs/Pleura: Lungs are well aerated bilaterally. Small right-sided effusion and basilar atelectasis is noted. Minimal scarring in the left base is again seen laterally. No confluent infiltrate or sizable nodule is seen. Upper Abdomen: Mild perihepatic ascites is noted. Some hypodense nodularity in the anterior aspect of the left lobe of the liver is again noted and stable. Mild fluid surrounding the spleen is noted as well. Musculoskeletal: Chronic T8 compression fracture is noted. No acute bony abnormality is noted. Review of the MIP images confirms the above findings. IMPRESSION: No evidence of pulmonary emboli. Small right-sided effusion and basilar  atelectasis. Mild ascites and nodularity along the anterior aspect of the left lobe of the liver similar to that seen on exam. Electronically Signed   By: Alcide Clever M.D.   On: 03/18/2024 00:59   DG Chest 2 View Result Date: 03/17/2024 CLINICAL DATA:  SOB Cancer patient. Abd pain, shob, ?ascites. Smoker. EXAM: CHEST - 2 VIEW COMPARISON:  CT chest 02/18/2024, chest x-ray 09/03/2023 FINDINGS: Right chest wall Port-A-Cath with tip overlying the right atrium. The heart and mediastinal contours are unchanged. Bilateral base linear atelectasis versus scarring. No focal consolidation. No pulmonary edema. No pleural effusion. No pneumothorax. No acute osseous abnormality. Chronic mid to lower thoracic spine vertebral body compression fracture. IMPRESSION: No active cardiopulmonary disease. Electronically Signed   By: Tish Frederickson M.D.   On: 03/17/2024 22:55   CT ABDOMEN PELVIS W CONTRAST Result Date: 03/05/2024 CLINICAL DATA:  Primary peritoneal carcinomatosis and rising CEA markers. * Tracking Code: BO * EXAM: CT ABDOMEN AND PELVIS WITH CONTRAST TECHNIQUE: Multidetector CT imaging of the abdomen and pelvis was performed using the standard protocol following bolus administration of intravenous contrast. RADIATION DOSE REDUCTION: This exam was performed according to the departmental dose-optimization program which includes automated exposure control, adjustment of the mA and/or kV according to patient size and/or use of iterative reconstruction technique. CONTRAST:  ISOVUE-300 IOPAMIDOL (ISOVUE-300) INJECTION 61% COMPARISON:  11/06/2023 FINDINGS: Lower chest: A presumed central catheter terminates in  the vicinity of the tricuspid valve. Trace amount of gas in the right ventricle. This is typically incidental from IV placement and in the absence of a right to left vascular shunt is clinically insignificant. Mild cardiomegaly is present. Small right pleural effusion with associated passive atelectasis.  Subsegmental atelectasis or scarring in both lower lobes. Hepatobiliary: Small amount of perihepatic ascites again observed along with hepatic capsular tumor deposition, for example along the anterior margin of the lateral segment left hepatic lobe measuring 2.3 by 0.9 cm and previously the same on 02/18/2024 chest CT although increased from 11/06/2023. No intraparenchymal lesion in the liver is observed. Gallbladder unremarkable. No biliary dilatation. Pancreas: Unremarkable Spleen: Unremarkable Adrenals/Urinary Tract: Unremarkable Stomach/Bowel: Normal amount of stool in the proximal colon although not the distal colon. A small portion of the transverse colon extends partially into a broad region of rectus diastasis on image 43 series 2. No dilated small bowel. Vascular/Lymphatic: Unremarkable Reproductive: Uterus absent. Specific ovarian tissue not identified. Along the upper margin of the vaginal cuff and eccentric to the right, a new 6.3 by 2.4 by 3.6 cm (volume = 29 cm^3) fluid density lesion with some thickening of the margins is visible. Other: In addition to the lesion extending cephalad from the right side of the vaginal cuff, another new contained appearing cystic lesion in the central pelvis measures 4.1 by 3.6 by 3.4 cm (volume = 26 cm^3) on image 66 series 2, without thickened margins except inferiorly. In addition to the perihepatic fluid signal in nodularity, there is some fluid signal anteriorly in the region of rectus diastasis on image 45 series 2 which is increased from 11/06/2023. Small amount of right anterior pelvic ascites, for example on image 69 series 2. Suspected tumor caking in the pelvis along the inferior margin of the sigmoid colon for example on image 60 of series 5. New (from 11/06/2023) tumor nodularity in the left upper quadrant for example on image 80 of series 5, with local tumor deposit measuring up to 1.1 cm in long axis. Musculoskeletal: Rectus diastasis as noted above,  potentially with lax hernia mesh. Disc bulge and facet arthropathy cause mild bilateral foraminal stenosis at L4-5. IMPRESSION: 1. New (from 11/06/2023) tumor nodularity in the left upper quadrant, along the anterior margin of the lateral segment left hepatic lobe, and along the inferior margin of the sigmoid colon. 2. New cystic lesions in the pelvis with some thickening along the margins, likely related to tumor deposits. 3. Small amount of perihepatic ascites and right anterior pelvic ascites. 4. Small right pleural effusion with associated passive atelectasis. 5. Mild cardiomegaly. 6. Disc bulge and facet arthropathy cause mild bilateral foraminal stenosis at L4-5. 7. A presumed central catheter terminates in the vicinity of the tricuspid valve, mildly low/distal in position and if central right atrial positioning is desired. Electronically Signed   By: Gaylyn Rong M.D.   On: 03/05/2024 14:13   CT Cervical Spine Wo Contrast Result Date: 02/19/2024 CLINICAL DATA:  Metastatic disease evaluation EXAM: CT CERVICAL SPINE WITHOUT CONTRAST TECHNIQUE: Multidetector CT imaging of the cervical spine was performed without intravenous contrast. Multiplanar CT image reconstructions were also generated. RADIATION DOSE REDUCTION: This exam was performed according to the departmental dose-optimization program which includes automated exposure control, adjustment of the mA and/or kV according to patient size and/or use of iterative reconstruction technique. COMPARISON:  None Available. FINDINGS: Alignment: Normal Skull base and vertebrae: No acute fracture. No primary bone lesion or focal pathologic process. Soft tissues and spinal  canal: No prevertebral fluid or swelling. No visible canal hematoma. Disc levels:  Normal Upper chest: Negative Other: None IMPRESSION: No bony abnormality. Electronically Signed   By: Charlett Nose M.D.   On: 02/19/2024 00:22   CT Angio Chest PE W and/or Wo Contrast Result Date:  02/19/2024 CLINICAL DATA:  Pulmonary embolism (PE) suspected, high prob. Chest pain EXAM: CT ANGIOGRAPHY CHEST WITH CONTRAST TECHNIQUE: Multidetector CT imaging of the chest was performed using the standard protocol during bolus administration of intravenous contrast. Multiplanar CT image reconstructions and MIPs were obtained to evaluate the vascular anatomy. RADIATION DOSE REDUCTION: This exam was performed according to the departmental dose-optimization program which includes automated exposure control, adjustment of the mA and/or kV according to patient size and/or use of iterative reconstruction technique. CONTRAST:  75mL OMNIPAQUE IOHEXOL 350 MG/ML SOLN COMPARISON:  11/06/2023 FINDINGS: Cardiovascular: Heart is normal size. Aorta is normal caliber. No filling defects in the pulmonary arteries to suggest pulmonary emboli. Mediastinum/Nodes: No mediastinal, hilar, or axillary adenopathy. Trachea and esophagus are unremarkable. Thyroid unremarkable. Lungs/Pleura: Trace right pleural effusion and dependent/bibasilar atelectasis. No confluent opacities. Upper Abdomen: No acute findings Musculoskeletal: Chest wall soft tissues are unremarkable. No acute bony abnormality. Mild compression deformity at T8, unchanged since prior study. Review of the MIP images confirms the above findings. IMPRESSION: No evidence of pulmonary embolus. Trace right pleural effusion.  Dependent and bibasilar atelectasis. Electronically Signed   By: Charlett Nose M.D.   On: 02/19/2024 00:21   CT Head Wo Contrast Result Date: 02/19/2024 CLINICAL DATA:  Headache, right upper extremity weakness. EXAM: CT HEAD WITHOUT CONTRAST TECHNIQUE: Contiguous axial images were obtained from the base of the skull through the vertex without intravenous contrast. RADIATION DOSE REDUCTION: This exam was performed according to the departmental dose-optimization program which includes automated exposure control, adjustment of the mA and/or kV according to  patient size and/or use of iterative reconstruction technique. COMPARISON:  08/22/2023 FINDINGS: Brain: No acute intracranial abnormality. Specifically, no hemorrhage, hydrocephalus, mass lesion, acute infarction, or significant intracranial injury. Vascular: No hyperdense vessel or unexpected calcification. Skull: No acute calvarial abnormality. Sinuses/Orbits: No acute findings Other: None IMPRESSION: Normal study. Electronically Signed   By: Charlett Nose M.D.   On: 02/19/2024 00:16    EKG  Date: 03/18/2024  Rate: 85  Rhythm: normal sinus rhythm  QRS Axis: normal  Intervals: normal  ST/T Wave abnormalities: normal  Conduction Disutrbances: none  Narrative Interpretation: unremarkable      Radiology CT ABDOMEN PELVIS W CONTRAST Result Date: 03/18/2024 CLINICAL DATA:  Polytrauma, blunt.  Stage IV ovarian cancer. EXAM: CT ABDOMEN AND PELVIS WITH CONTRAST TECHNIQUE: Multidetector CT imaging of the abdomen and pelvis was performed using the standard protocol following bolus administration of intravenous contrast. RADIATION DOSE REDUCTION: This exam was performed according to the departmental dose-optimization program which includes automated exposure control, adjustment of the mA and/or kV according to patient size and/or use of iterative reconstruction technique. CONTRAST:  OMNIPAQUE IOHEXOL 350 MG/ML SOLN COMPARISON:  CT abdomen pelvis 02/27/2024 FINDINGS: Lower chest: Please see separately dictated CT angiography chest 03/18/2024. Hepatobiliary: Not enlarged. Redemonstration of peripheral left lateral hepatic lobe mass measuring up to 2.5 cm (301:16). No laceration or subcapsular hematoma. The gallbladder is otherwise unremarkable with no radio-opaque gallstones. No biliary ductal dilatation. Pancreas: Normal pancreatic contour. No main pancreatic duct dilatation. Spleen: Not enlarged. No focal lesion. No laceration, subcapsular hematoma, or vascular injury. Adrenals/Urinary Tract: No  nodularity bilaterally. Bilateral kidneys enhance symmetrically. No hydronephrosis.  No contusion, laceration, or subcapsular hematoma. No injury to the vascular structures or collecting systems. No hydroureter. The urinary bladder is unremarkable. Stomach/Bowel: No small or large bowel wall thickening or dilatation. Similar-appearing fluid density lesions along the sigmoid colon (306:66, 71). The appendix is unremarkable. Vasculature/Lymphatics: No abdominal aorta or iliac aneurysm. No active contrast extravasation or pseudoaneurysm. No abdominal, pelvic, inguinal lymphadenopathy. Reproductive: Status post hysterectomy. Other: Interval increase in small volume simple free fluid with associated peritoneal thickening most prominent along the right upper quadrant suggestive of peritoneal carcinomatosis. No pneumoperitoneum. No hemoperitoneum. No mesenteric hematoma identified. No organized fluid collection. Musculoskeletal: Redemonstration of moderate volume umbilical hernia containing short segment of small bowel and free fluid. Interval reduction of the transverse colon previously noted within the hernia. No significant soft tissue hematoma. No acute pelvic fracture. No spinal fracture. Other ports and devices: None. IMPRESSION: 1. No acute intra-abdominal, intrapelvic traumatic injury. 2. No acute fracture or traumatic malalignment of the lumbar spine. Other imaging findings of potential clinical significance: 1. Interval increase in small volume free fluid ascites with associated peritoneal carcinomatosis. 2. Redemonstration of nodule ear D related to patient's malignancy along the lateral left hepatic lobe and sigmoid colon. 3. Redemonstration of moderate volume umbilical hernia containing short segment of small bowel and free fluid. Interval reduction of the transverse colon previously noted within the hernia. No associated bowel obstruction or findings to suggest ischemia. Please see separately dictated CT  angiography chest 03/18/2024. Electronically Signed   By: Tish Frederickson M.D.   On: 03/18/2024 01:05   CT Angio Chest PE W and/or Wo Contrast Result Date: 03/18/2024 CLINICAL DATA:  Recent syncopal episode with shortness of breath EXAM: CT ANGIOGRAPHY CHEST WITH CONTRAST TECHNIQUE: Multidetector CT imaging of the chest was performed using the standard protocol during bolus administration of intravenous contrast. Multiplanar CT image reconstructions and MIPs were obtained to evaluate the vascular anatomy. RADIATION DOSE REDUCTION: This exam was performed according to the departmental dose-optimization program which includes automated exposure control, adjustment of the mA and/or kV according to patient size and/or use of iterative reconstruction technique. CONTRAST:  OMNIPAQUE IOHEXOL 350 MG/ML SOLN COMPARISON:  Chest x-ray from the prior day. FINDINGS: Cardiovascular: Thoracic aorta shows no aneurysmal dilatation or dissection. No cardiac enlargement is seen. The pulmonary artery shows a normal branching pattern bilaterally. No filling defect to suggest pulmonary embolism is noted. Right chest wall port is noted. Mediastinum/Nodes: Thoracic inlet is within normal limits. No hilar or mediastinal adenopathy is noted. The esophagus as visualized is within normal limits. Lungs/Pleura: Lungs are well aerated bilaterally. Small right-sided effusion and basilar atelectasis is noted. Minimal scarring in the left base is again seen laterally. No confluent infiltrate or sizable nodule is seen. Upper Abdomen: Mild perihepatic ascites is noted. Some hypodense nodularity in the anterior aspect of the left lobe of the liver is again noted and stable. Mild fluid surrounding the spleen is noted as well. Musculoskeletal: Chronic T8 compression fracture is noted. No acute bony abnormality is noted. Review of the MIP images confirms the above findings. IMPRESSION: No evidence of pulmonary emboli. Small right-sided effusion  and basilar atelectasis. Mild ascites and nodularity along the anterior aspect of the left lobe of the liver similar to that seen on exam. Electronically Signed   By: Alcide Clever M.D.   On: 03/18/2024 00:59   DG Chest 2 View Result Date: 03/17/2024 CLINICAL DATA:  SOB Cancer patient. Abd pain, shob, ?ascites. Smoker. EXAM: CHEST - 2 VIEW COMPARISON:  CT chest 02/18/2024, chest x-ray 09/03/2023 FINDINGS: Right chest wall Port-A-Cath with tip overlying the right atrium. The heart and mediastinal contours are unchanged. Bilateral base linear atelectasis versus scarring. No focal consolidation. No pulmonary edema. No pleural effusion. No pneumothorax. No acute osseous abnormality. Chronic mid to lower thoracic spine vertebral body compression fracture. IMPRESSION: No active cardiopulmonary disease. Electronically Signed   By: Tish Frederickson M.D.   On: 03/17/2024 22:55    Procedures Procedures    Medications Ordered in ED Medications  albuterol (VENTOLIN HFA) 108 (90 Base) MCG/ACT inhaler 2 puff (has no administration in time range)  morphine (PF) 4 MG/ML injection 4 mg (4 mg Intravenous Given 03/17/24 2357)  iohexol (OMNIPAQUE) 350 MG/ML injection 100 mL (100 mLs Intravenous Contrast Given 03/18/24 0051)  ketorolac (TORADOL) 30 MG/ML injection 15 mg (15 mg Intravenous Given 03/18/24 0315)  droperidol (INAPSINE) 2.5 MG/ML injection 1.25 mg (1.25 mg Intravenous Given 03/18/24 0315)  oxyCODONE-acetaminophen (PERCOCET/ROXICET) 5-325 MG per tablet 1 tablet (1 tablet Oral Given 03/18/24 0344)    ED Course/ Medical Decision Making/ A&P                                 Medical Decision Making Patient with swelling and pain in the abdomen and SOB that she feels is due to abdomen pushing on her diaphragm   Amount and/or Complexity of Data Reviewed External Data Reviewed: notes.    Details: Previous notes reviewed  Labs: ordered.    Details: Patient with negative covid and flu.  Normal white count 6.7,  hemoglobin low 11.9, normal platelets.  Normal sodium 137, normal potassium 3.9, normal creatinine 0.77  Radiology: ordered and independent interpretation performed.    Details: Pleural effusion by me on CTA, lesions of the abdominal wall  ECG/medicine tests: ordered and independent interpretation performed. Decision-making details documented in ED Course.  Risk Prescription drug management. Risk Details: I admitted the patient for pain and SOB with pleural effusion and small recurrent ascites in order that they may tap these areas.  Xarelto will need to by held for tap of effusion and small ascites seen on CT.  I have admitted the patient to Dr. Arlean Hopping.  Patient had a bed assigned at [facility] and then patient decided she no longer wished to be admitted.  EDP asked multiple times if she was sure and she has decided to be discharge.      Final Clinical Impression(s) / ED Diagnoses Final diagnoses:  Cancer related pain   The patient appears reasonably stabilized for admission considering the current resources, flow, and capabilities available in the ED at this time, and I doubt any other [facility] requiring further screening and/or treatment in the ED prior to admission.  Rx / DC Orders ED Discharge Orders     None         Elbert Polyakov, MD 03/18/24 [phone]

## 2024-03-18 NOTE — Progress Notes (Signed)
 Hospitalist Transfer Note:    Nursing staff, Please call [facility] Admits & Consults System-Wide number on Amion [phone]) as soon as patient's arrival, so appropriate admitting provider can evaluate the pt.   Transferring facility: [facility] Requesting provider: Dr. Nicanor Alcon (EDP at [facility]) Reason for transfer: admission for further evaluation and management of abdominal pain.   65 F w/ metastatic ovarian cancer, who presented to [facility] ED complaining of worsening abdominal discomfort associated with increased abdominal distention.  With the increasing abdominal distention, she also reports some mild shortness of breath as well as some pleuritic, nonexertional chest discomfort.  She has metastatic ovarian cancer, following with oncology.  She reports that she is scheduled to undergo some additional biopsies next week to evaluate for potential second primary malignancy.   Imaging notable for CTA chest , which showed a small right-sided pleural effusion, but no evidence of pulmonary embolism or any evidence of infiltrate.  CT abdomen/pelvis showed interval increase in small volume ascites associated with peritoneal carcinomatosis, without evidence of bowel obstruction.  Subsequently, I accepted this patient for transfer for observation to a med/tele bed at [facility] for further work-up and management of the above.       Newton Pigg, DO Hospitalist

## 2024-03-20 ENCOUNTER — Telehealth: Payer: Self-pay

## 2024-03-20 NOTE — Telephone Encounter (Signed)
 Called [facility] for update on slides being sent for NGS testing. They were shipped 03/19/2024. [employer] tracking [id number]. Per tracking they were received at [facility] today.

## 2024-03-21 ENCOUNTER — Other Ambulatory Visit: Payer: Self-pay

## 2024-03-21 ENCOUNTER — Emergency Department (HOSPITAL_COMMUNITY)

## 2024-03-21 ENCOUNTER — Emergency Department (HOSPITAL_COMMUNITY)
Admission: EM | Admit: 2024-03-21 | Discharge: 2024-03-22 | Disposition: A | Discharge status: Home / Self Care | Attending: Emergency Medicine | Admitting: Emergency Medicine

## 2024-03-21 ENCOUNTER — Encounter (HOSPITAL_COMMUNITY): Payer: Self-pay

## 2024-03-21 DIAGNOSIS — C569 Malignant neoplasm of unspecified ovary: Secondary | ICD-10-CM | POA: Insufficient documentation

## 2024-03-21 DIAGNOSIS — Z7901 Long term (current) use of anticoagulants: Secondary | ICD-10-CM | POA: Diagnosis not present

## 2024-03-21 DIAGNOSIS — R0602 Shortness of breath: Secondary | ICD-10-CM | POA: Insufficient documentation

## 2024-03-21 DIAGNOSIS — E876 Hypokalemia: Secondary | ICD-10-CM | POA: Insufficient documentation

## 2024-03-21 DIAGNOSIS — R110 Nausea: Secondary | ICD-10-CM | POA: Insufficient documentation

## 2024-03-21 DIAGNOSIS — R1031 Right lower quadrant pain: Secondary | ICD-10-CM

## 2024-03-21 DIAGNOSIS — K429 Umbilical hernia without obstruction or gangrene: Secondary | ICD-10-CM | POA: Diagnosis not present

## 2024-03-21 DIAGNOSIS — R109 Unspecified abdominal pain: Secondary | ICD-10-CM | POA: Diagnosis present

## 2024-03-21 LAB — CBC WITH DIFFERENTIAL/PLATELET
Abs Immature Granulocytes: 0.01 10*3/uL (ref 0.00–0.07)
Basophils Absolute: 0 10*3/uL (ref 0.0–0.1)
Basophils Relative: 1 %
Eosinophils Absolute: 0.1 10*3/uL (ref 0.0–0.5)
Eosinophils Relative: 1 %
HCT: 36.9 % (ref 36.0–46.0)
Hemoglobin: 12.2 g/dL (ref 12.0–15.0)
Immature Granulocytes: 0 %
Lymphocytes Relative: 26 %
Lymphs Abs: 1.6 10*3/uL (ref 0.7–4.0)
MCH: 32 pg (ref 26.0–34.0)
MCHC: 33.1 g/dL (ref 30.0–36.0)
MCV: 96.9 fL (ref 80.0–100.0)
Monocytes Absolute: 0.5 10*3/uL (ref 0.1–1.0)
Monocytes Relative: 8 %
Neutro Abs: 4.1 10*3/uL (ref 1.7–7.7)
Neutrophils Relative %: 64 %
Platelets: 164 10*3/uL (ref 150–400)
RBC: 3.81 MIL/uL — ABNORMAL LOW (ref 3.87–5.11)
RDW: 13.3 % (ref 11.5–15.5)
WBC: 6.4 10*3/uL (ref 4.0–10.5)
nRBC: 0 % (ref 0.0–0.2)

## 2024-03-21 LAB — COMPREHENSIVE METABOLIC PANEL WITH GFR
ALT: 12 U/L (ref 0–44)
AST: 15 U/L (ref 15–41)
Albumin: 3.5 g/dL (ref 3.5–5.0)
Alkaline Phosphatase: 72 U/L (ref 38–126)
Anion gap: 9 (ref 5–15)
BUN: 14 mg/dL (ref 6–20)
CO2: 22 mmol/L (ref 22–32)
Calcium: 8.5 mg/dL — ABNORMAL LOW (ref 8.9–10.3)
Chloride: 106 mmol/L (ref 98–111)
Creatinine, Ser: 0.73 mg/dL (ref 0.44–1.00)
GFR, Estimated: >60 mL/min (ref >60)
Glucose, Bld: 91 mg/dL (ref 70–99)
Potassium: 3 mmol/L — ABNORMAL LOW (ref 3.5–5.1)
Sodium: 137 mmol/L (ref 135–145)
Total Bilirubin: 0.6 mg/dL (ref 0.0–1.2)
Total Protein: 6.4 g/dL — ABNORMAL LOW (ref 6.5–8.1)

## 2024-03-21 LAB — LIPASE, BLOOD: Lipase: 24 U/L (ref 11–51)

## 2024-03-21 MED ORDER — ONDANSETRON HCL 4 MG/2ML IJ SOLN
4.0000 mg | Freq: Once | INTRAMUSCULAR | Status: AC
  Administered 2024-03-21: 4 mg via INTRAVENOUS
  Filled 2024-03-21: qty 2

## 2024-03-21 MED ORDER — HYDROMORPHONE HCL 1 MG/ML IJ SOLN
1.0000 mg | Freq: Once | INTRAMUSCULAR | Status: AC
  Administered 2024-03-21: 1 mg via INTRAVENOUS
  Filled 2024-03-21: qty 1

## 2024-03-21 MED ORDER — SODIUM CHLORIDE 0.9 % IV BOLUS
1000.0000 mL | Freq: Once | INTRAVENOUS | Status: AC
  Administered 2024-03-21: 1000 mL via INTRAVENOUS

## 2024-03-21 MED ORDER — IOHEXOL 350 MG/ML SOLN
100.0000 mL | Freq: Once | INTRAVENOUS | Status: AC | PRN
  Administered 2024-03-22: 100 mL via INTRAVENOUS

## 2024-03-21 NOTE — ED Provider Notes (Addendum)
 [place] EMERGENCY DEPARTMENT AT [facility] Provider Note   CSN: [id number] Arrival date & time: 03/21/24  2131     History  Chief Complaint  Patient presents with   Generalized Pain    [name] is a 53 y.o. female.  Patient's oncology team is at [facility].  Patient now lives in [place].  Patient has been struggling with abdominal pain.  Patient has stage IV ovarian cancer.  Also known to have umbilical hernia.  Patient seen at [facility] on 24 March for pain had CT scan abdomen and pelvis also had CT angio no evidence of any blood clots.  But definitely has metastatic ovarian cancer.  Patient is currently not receiving any chemotherapy.  Patient last seen in the oncology clinic in [place] on February 25.  Patient does have oxycodone at home.  But it is not holding.  Patient states most of the pain is in the right side of the abdomen.  Associated with nausea but no vomiting.  Some shortness of breath.  Patient is on oxycodone 10 mg.  Patient has been battling the ovarian cancer now for 4 years.  She is currently on a holiday from chemo because they are doing some specialized biopsies this determine what chemo may be advisable or approach may be advisable for the next plan.  She expecting to have those results back within the next 2 weeks.  Patient does have a port in the right anterior chest.       Home Medications Prior to Admission medications   Medication Sig Start Date End Date Taking? Authorizing Provider  buPROPion (WELLBUTRIN XL) 300 MG 24 hr tablet Take 1 tablet (300 mg total) by mouth daily. 08/09/23   [name], MD  cariprazine (VRAYLAR) 1.5 MG capsule Take 1 capsule (1.5 mg total) by mouth daily. 08/09/23   [name], MD  cloNIDine (CATAPRES - DOSED IN MG/24 HR) 0.3 mg/24hr patch Place 1 patch (0.3 mg total) onto the skin once a week. Friday 07/31/23   Earna Coder, MD  cloNIDine (CATAPRES) 0.1 MG tablet Take 1 tablet (0.1 mg  total) by mouth at bedtime. 07/31/23   Earna Coder, MD  cyclobenzaprine (FLEXERIL) 5 MG tablet Take 5 mg by mouth 3 (three) times daily as needed for muscle spasms. 10/17/22   [provider]  docusate sodium (COLACE) 100 MG capsule Take 100 mg by mouth 2 (two) times daily.    [provider]  DULoxetine (CYMBALTA) 60 MG capsule TAKE 1 CAPSULE BY MOUTH DAILY 09/25/23   [name], MD  furosemide (LASIX) 20 MG tablet Take 1 tablet (20 mg total) by mouth daily. 08/29/23   Earna Coder, MD  LORazepam (ATIVAN) 0.5 MG tablet Take 1 tablet (0.5 mg total) by mouth every 12 (twelve) hours as needed for anxiety. 10/21/22   Earna Coder, MD  Magnesium Cl-Calcium Carbonate (SLOW MAGNESIUM/CALCIUM) 70-117 MG TBEC Take 1 tablet by mouth 2 (two) times daily. 08/03/23   Earna Coder, MD  meclizine (ANTIVERT) 25 MG tablet 1 pill at night as needed; if dizziness not improved can take one pill every 8 hours as needed/tolerated. 01/10/23   Earna Coder, MD  Multiple Vitamin (MULTIVITAMIN WITH MINERALS) TABS tablet Take 1 tablet by mouth daily.    [provider]  nystatin (MYCOSTATIN/NYSTOP) powder Apply 1 Application topically 3 (three) times daily. 09/28/22   [name], MD  omeprazole (PRILOSEC) 20 MG capsule TAKE 1 CAPSULE BY MOUTH DAILY  09/25/23   [name], MD  ondansetron (ZOFRAN-ODT) 8 MG disintegrating tablet Take 1 tablet (8 mg total) by mouth every 8 (eight) hours as needed for nausea or vomiting. 07/31/23   Earna Coder, MD  Oxycodone HCl 10 MG TABS Take 1 tablet (10 mg total) by mouth every 6 (six) hours as needed (for cancer related pain). 03/08/24   Alinda Dooms, NP  Pegfilgrastim-cbqv (UDENYCA) 6 MG/0.6ML SOAJ Inject 6 mg into the skin every 28 (twenty-eight) days. 11/06/23   Earna Coder, MD  polyethylene glycol powder (GLYCOLAX/MIRALAX) 17 GM/SCOOP powder Take 0.5 Containers by mouth daily as needed for mild  constipation or moderate constipation. 02/17/20   [provider]  potassium chloride SA (KLOR-CON M20) 20 MEQ tablet Take 1 tablet (20 mEq total) by mouth 2 (two) times daily. 02/20/24   Earna Coder, MD  pregabalin (LYRICA) 150 MG capsule Take 150 mg by mouth 3 (three) times daily.    E, Tanner Cyprus, PA-C  prochlorperazine (COMPAZINE) 10 MG tablet Take 1 tablet (10 mg total) by mouth every 6 (six) hours as needed for nausea or vomiting. 07/31/23   Earna Coder, MD  rivaroxaban (XARELTO) 20 MG TABS tablet Take 1 tablet (20 mg total) by mouth daily with supper. 07/31/23   Earna Coder, MD  rosuvastatin (CRESTOR) 5 MG tablet TAKE 1 TABLET BY MOUTH ONCE  DAILY (IN PLACE OF ATORVASTATIN) 09/25/23   Sowles, Danna Hefty, MD  senna (SENOKOT) 8.6 MG tablet Take 1 tablet by mouth daily as needed for constipation.    [provider]  solifenacin (VESICARE) 10 MG tablet Take 10 mg by mouth daily. 02/14/24 02/13/25  [provider]  SUMAtriptan (IMITREX) 100 MG tablet Take 1 tablet (100 mg total) by mouth every 2 (two) hours as needed for migraine. May repeat in 2 hours if headache persists or recurs. 08/09/23   [name], MD  Tiotropium Bromide Monohydrate (SPIRIVA RESPIMAT) 2.5 MCG/ACT AERS Inhale 2 puffs into the lungs daily. 08/09/23   [name], MD  traZODone (DESYREL) 300 MG tablet Take 1 tablet (300 mg total) by mouth at bedtime as needed for sleep. 12/08/23   Earna Coder, MD  valACYclovir (VALTREX) 1000 MG tablet Take 1 tablet (1,000 mg total) by mouth 2 (two) times daily as needed. 09/27/23   [name], MD  zolpidem (AMBIEN) 5 MG tablet Take 1 tablet (5 mg total) by mouth at bedtime as needed for sleep. 11/17/23   Earna Coder, MD  ranitidine (ZANTAC) 300 MG tablet Take 1 tablet (300 mg total) by mouth daily as needed for heartburn. 04/18/18 10/24/19  [name], MD      Allergies    Hydroxyzine hcl    Review of  Systems   Review of Systems  Constitutional:  Negative for chills and fever.  HENT:  Negative for ear pain and sore throat.   Eyes:  Negative for pain and visual disturbance.  Respiratory:  Positive for shortness of breath. Negative for cough.   Cardiovascular:  Negative for chest pain and palpitations.  Gastrointestinal:  Positive for abdominal pain and nausea. Negative for vomiting.  Genitourinary:  Negative for dysuria and hematuria.  Musculoskeletal:  Negative for arthralgias and back pain.  Skin:  Negative for color change and rash.  Neurological:  Negative for seizures and syncope.  All other systems reviewed and are negative.   Physical Exam Updated Vital Signs BP (!) 161/99 (BP Location: Left Arm)   Pulse 69  Temp 97.6 F (36.4 C) (Oral)   Resp 20   Ht 1.6 m (5\' 3" )   Wt 98 kg   LMP 06/19/2017   SpO2 99%   BMI 38.26 kg/m  Physical Exam Vitals and nursing note reviewed.  Constitutional:      General: She is not in acute distress.    Appearance: Normal appearance. She is well-developed.  HENT:     Head: Normocephalic and atraumatic.  Eyes:     Extraocular Movements: Extraocular movements intact.     Conjunctiva/sclera: Conjunctivae normal.     Pupils: Pupils are equal, round, and reactive to light.  Cardiovascular:     Rate and Rhythm: Normal rate and regular rhythm.     Heart sounds: No murmur heard. Pulmonary:     Effort: Pulmonary effort is normal. No respiratory distress.     Breath sounds: Normal breath sounds.     Comments: Port right anterior chest. Abdominal:     General: There is no distension.     Palpations: Abdomen is soft.     Tenderness: There is abdominal tenderness. There is guarding.  Musculoskeletal:        General: No swelling.     Cervical back: Normal range of motion and neck supple.     Right lower leg: No edema.     Left lower leg: No edema.  Skin:    General: Skin is warm and dry.     Capillary Refill: Capillary refill takes less  than 2 seconds.  Neurological:     General: No focal deficit present.     Mental Status: She is alert and oriented to person, place, and time.  Psychiatric:        Mood and Affect: Mood normal.     ED Results / Procedures / Treatments   Labs (all labs ordered are listed, but only abnormal results are displayed) Labs Reviewed  CBC WITH DIFFERENTIAL/PLATELET  COMPREHENSIVE METABOLIC PANEL WITH GFR  LIPASE, BLOOD    EKG None  Radiology No results found.  Procedures Procedures    Medications Ordered in ED Medications  ondansetron (ZOFRAN) injection 4 mg (has no administration in time range)  HYDROmorphone (DILAUDID) injection 1 mg (has no administration in time range)  sodium chloride 0.9 % bolus 1,000 mL (has no administration in time range)    ED Course/ Medical Decision Making/ A&P                                 Medical Decision Making Amount and/or Complexity of Data Reviewed Labs: ordered. Radiology: ordered.  Risk Prescription drug management.   Patient seen for similar findings on March 24 at [facility].  CT scan abdomen pelvis without any acute findings but patient does have an umbilical hernia.  And patient definitely has stage IV ovarian cancer.  Patient is on Xarelto.  Will go ahead and repeat CT angio chest CT abdomen and pelvis.  Will get labs give her some fluids and treat with pain medication.  Patient's CBC white count 6.4 hemoglobin 12.2 platelets 167.  Complete metabolic panels back potassium a little low at 3.0.  Will give her some oral potassium.  Renal function good GFR is greater than 60 LFTs very normal but she is known to have mets to the liver area.  Anion gap is 9 lipase is 24.  Patient much comfortable after 1 mg of Dilaudid.  Patient's abdomen soft nontender  no evidence of any incarcerated hernia.  CT chest and CT abdomen and pelvis is pending.  Patient may very well just need to increase her current dose of oxycodone 10 mg  may need to double that at times.  And would recommend if the CTs are negative that she follow back up with [place] [facility].   Final Clinical Impression(s) / ED Diagnoses Final diagnoses:  [employer] (FIGO) stage IVA epithelial ovarian cancer (HCC)  Right lower quadrant abdominal pain    Rx / DC Orders ED Discharge Orders     None         Vanetta Mulders, MD 03/21/24 2212    Vanetta Mulders, MD 03/21/24 6045    Vanetta Mulders, MD 03/21/24 2313

## 2024-03-21 NOTE — ED Triage Notes (Signed)
 Pain all over, stage 4 ovarian cancer. Not on chemo at this time. States pain has been progressively getting worse. Pain is worse in abdomen.

## 2024-03-21 NOTE — ED Notes (Signed)
 Pt asked for ice chips advised can not have anything until scans come back

## 2024-03-22 ENCOUNTER — Telehealth: Payer: Self-pay

## 2024-03-22 ENCOUNTER — Inpatient Hospital Stay: Admitting: Hospice and Palliative Medicine

## 2024-03-22 ENCOUNTER — Other Ambulatory Visit: Payer: Self-pay | Admitting: Internal Medicine

## 2024-03-22 ENCOUNTER — Encounter: Payer: Self-pay | Admitting: Nurse Practitioner

## 2024-03-22 DIAGNOSIS — C569 Malignant neoplasm of unspecified ovary: Secondary | ICD-10-CM | POA: Diagnosis not present

## 2024-03-22 DIAGNOSIS — Z7189 Other specified counseling: Secondary | ICD-10-CM

## 2024-03-22 DIAGNOSIS — C482 Malignant neoplasm of peritoneum, unspecified: Secondary | ICD-10-CM

## 2024-03-22 DIAGNOSIS — G893 Neoplasm related pain (acute) (chronic): Secondary | ICD-10-CM | POA: Diagnosis not present

## 2024-03-22 DIAGNOSIS — Z515 Encounter for palliative care: Secondary | ICD-10-CM | POA: Diagnosis not present

## 2024-03-22 MED ORDER — HEPARIN SOD (PORK) LOCK FLUSH 100 UNIT/ML IV SOLN
500.0000 [IU] | Freq: Once | INTRAVENOUS | Status: AC
  Administered 2024-03-22: 500 [IU]
  Filled 2024-03-22: qty 5

## 2024-03-22 MED ORDER — HYDROMORPHONE HCL 1 MG/ML IJ SOLN
1.0000 mg | Freq: Once | INTRAMUSCULAR | Status: AC
  Administered 2024-03-22: 1 mg via INTRAVENOUS
  Filled 2024-03-22: qty 1

## 2024-03-22 MED ORDER — HYDROMORPHONE HCL 2 MG PO TABS: 2.0000 mg | ORAL_TABLET | ORAL | 0 refills | Status: DC | PRN

## 2024-03-22 NOTE — Progress Notes (Signed)
 Virtual Visit via Telephone Note  I connected with [name] on 03/22/24 at  1:00 PM EDT by telephone and verified that I am speaking with the correct person using two identifiers.  Location: Patient: Home Provider: Clinic   I discussed the limitations, risks, security and privacy concerns of performing an evaluation and management service by telephone and the availability of in person appointments. I also discussed with the patient that there may be a patient responsible charge related to this service. The patient expressed understanding and agreed to proceed.   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  Patient has also had chronic abdominal pain.  She was referred to palliative care to help address goals and manage ongoing symptoms.   Observations/Objective: Patient was an add-on today to discuss pain management.  I called and spoke with patient by phone.  Patient presented to the emergency department twice this week with complaints of right upper abdominal pain.  She has had abdominal CTs during both ER visits without any acute findings.  Patient has noted to have significant peritoneal carcinomatosis and tumor abutting her liver.  This is likely contributing to pain.  Patient states that she has been unable to take her oxycodone as it causes nausea.  She was given IV hydromorphone in the ER with significant improvement in pain.  Patient was discharged from the ER this morning and prescribed oral hydromorphone but patient has not yet picked that up  from the pharmacy.  She was only prescribed 10 tablets.  Will send a new prescription with enough quantity to get her through the weekend.  Discussed her taking antiemetics as needed.  Patient has nausea but no vomiting.  Denies abdominal distention.  7 regular bowel movements.  No fever or chills.  I called and spoke with IR regarding CT.  There is not enough ascites to warrant paracentesis at this point.  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  Discussed with Dr. B who will see patient next week to restart chemotherapy   Neoplasm related pain-discontinue oxycodone.  Start hydromorphone 2 mg every 4 hours as needed for pain #45.  Daily bowel regimen to prevent opioid-induced constipation.  Consider starting a long-acting opioid if needed.  Follow Up Instructions: Follow-up next week   I discussed the assessment and treatment plan with the patient. The patient was provided an opportunity to ask questions and all were answered. The patient agreed with the plan and demonstrated an understanding of the instructions.   The patient was advised to call back or seek an in-person evaluation if the symptoms worsen or if the condition fails to improve as anticipated.  I provided 10 minutes of non-face-to-face time during this encounter.   Malachy Moan, NP

## 2024-03-22 NOTE — ED Notes (Signed)
 Patient drove here and has no other ride at this time. Spoke with charge RN and patient is going to wait till the medication wears off some.

## 2024-03-22 NOTE — ED Notes (Signed)
 Patient transported to CT

## 2024-03-22 NOTE — ED Provider Notes (Signed)
 Patient signed out to me by Dr. Deretha Emory to follow-up on CT scans.  Patient with cancer related pain, uncontrolled at home, likely secondary to underdosing of her pain meds.  CT PE study, CT abdomen and pelvis have been performed and there are no acute changes.  Patient did get good relief with the IV Dilaudid.  Pain starting to come back.  Will give an additional dose, counseled patient on taking her pain medications at home on a timely basis to prevent getting back to the amount of pain she was experiencing.  Follow-up with oncology for further instructions.   Gilda Crease, MD 03/22/24 [phone]

## 2024-03-22 NOTE — Progress Notes (Signed)
 DISCONTINUE OFF PATHWAY REGIMEN - Other   [id number]:Carboplatin AUC=5 IV D1 + Liposomal Doxorubicin 30 mg/m2 IV D1 q28 Days:   A cycle is every 28 days:     Carboplatin      Liposomal doxorubicin   **Always confirm dose/schedule in your pharmacy ordering system**  PRIOR TREATMENT: Carboplatin AUC=5 IV D1 + Liposomal Doxorubicin 30 mg/m2 IV D1 q28 Days  START OFF PATHWAY REGIMEN - Other   [id number]:Gemcitabine 800 mg/m2 IV D1,8 q21 Days:   A cycle is every 21 days:     Gemcitabine   **Always confirm dose/schedule in your pharmacy ordering system**  Patient Characteristics: Intent of Therapy: Non-Curative / Palliative Intent, Discussed with Patient

## 2024-03-22 NOTE — Telephone Encounter (Signed)
 Called and spoke with patient who sent a Mychart in regards to requesting a returned call from [employer]. Patient is experiencing pain on right side of her abd. Pain is 10/10.Per patient her oxycodone makes her nauseous and is unable to tolerate it.  Per patient went to ED Sunday due to her pain, they wanted to admit her for pain management but patient denied. She returned to ED yesterday, 03/21/2024, with pain. ED would not admit her. Asked patient if she feels she needs to be seen. Patient response, "no, I just feel like I'm dying." Advised I would speak to Lauren to have her call and follow up with patient verbalized understanding.

## 2024-03-22 NOTE — Telephone Encounter (Signed)
 Patient called stating that her pharmacy does not have Dilaudid medication available.  [facility] was sent, they do have medication in stock just not the quantity of 45 as prescribed. They have at least 30 in stock but if they fill as partial they will not be able to honor full quantity prescribed but will need a new rx.    Patient informed and she will get [facility].

## 2024-03-22 NOTE — Progress Notes (Signed)
 Discussed with Josh Borders-worsening abdominal symptoms.   Patient symptomatic from her underlying peritoneal carcinomatosis.  Still awaiting on FGFR alpha testing.  Given ongoing symptoms would recommend starting chemotherapy with gemcitabine ASAP.  BC-let me know-I can adjust the dates.  # MD: labs- cbc/cmp; Ca125. Chemo- #1  week later- cbc/cmp; Ca125. Chemo-  Cancel April 14th- appts- Thanks GB

## 2024-03-22 NOTE — Discharge Instructions (Addendum)
 Please contact your oncologist for change in pain management meds.

## 2024-03-25 ENCOUNTER — Other Ambulatory Visit: Payer: Self-pay | Admitting: Internal Medicine

## 2024-03-25 ENCOUNTER — Other Ambulatory Visit: Payer: Self-pay

## 2024-03-25 DIAGNOSIS — C482 Malignant neoplasm of peritoneum, unspecified: Secondary | ICD-10-CM

## 2024-03-25 DIAGNOSIS — Z7189 Other specified counseling: Secondary | ICD-10-CM

## 2024-03-25 NOTE — Progress Notes (Signed)
 Chemo orders updated-  GB

## 2024-03-25 NOTE — Progress Notes (Signed)
 Pharmacist Chemotherapy Monitoring - Initial Assessment    Anticipated start date: 03/28/24   The following has been reviewed per standard work regarding the patient's treatment regimen: The patient's diagnosis, treatment plan and drug doses, and organ/hematologic function Lab orders and baseline tests specific to treatment regimen  The treatment plan start date, drug sequencing, and pre-medications Prior authorization status  Patient's documented medication list, including drug-drug interaction screen and prescriptions for anti-emetics and supportive care specific to the treatment regimen The drug concentrations, fluid compatibility, administration routes, and timing of the medications to be used The patient's access for treatment and lifetime cumulative dose history, if applicable  The patient's medication allergies and previous infusion related reactions, if applicable   Changes made to treatment plan:  N/A  Follow up needed:  N/A   Sharen Hones, [facility], 03/25/2024  12:09 PM

## 2024-03-26 ENCOUNTER — Ambulatory Visit: Payer: 59

## 2024-03-27 ENCOUNTER — Other Ambulatory Visit: Payer: Self-pay

## 2024-03-27 ENCOUNTER — Telehealth: Payer: Self-pay | Admitting: Hospice and Palliative Medicine

## 2024-03-27 ENCOUNTER — Inpatient Hospital Stay: Attending: Internal Medicine | Admitting: Hospice and Palliative Medicine

## 2024-03-27 DIAGNOSIS — Z515 Encounter for palliative care: Secondary | ICD-10-CM

## 2024-03-27 DIAGNOSIS — C4490 Unspecified malignant neoplasm of skin, unspecified: Secondary | ICD-10-CM | POA: Diagnosis not present

## 2024-03-27 DIAGNOSIS — Z79899 Other long term (current) drug therapy: Secondary | ICD-10-CM | POA: Insufficient documentation

## 2024-03-27 DIAGNOSIS — Z5111 Encounter for antineoplastic chemotherapy: Secondary | ICD-10-CM | POA: Insufficient documentation

## 2024-03-27 DIAGNOSIS — G893 Neoplasm related pain (acute) (chronic): Secondary | ICD-10-CM

## 2024-03-27 DIAGNOSIS — C482 Malignant neoplasm of peritoneum, unspecified: Secondary | ICD-10-CM | POA: Insufficient documentation

## 2024-03-27 MED ORDER — FENTANYL 25 MCG/HR TD PT72: 1.0000 | MEDICATED_PATCH | TRANSDERMAL | 0 refills | Status: DC

## 2024-03-27 MED ORDER — HYDROMORPHONE HCL 2 MG PO TABS: 2.0000 mg | ORAL_TABLET | ORAL | 0 refills | Status: DC | PRN

## 2024-03-27 MED ORDER — NALOXONE HCL 4 MG/0.1ML NA LIQD: NASAL | 0 refills | Status: DC

## 2024-03-27 NOTE — Progress Notes (Signed)
 Virtual Visit via Telephone Note  I connected with [name] on 03/27/24 at  2:20 PM EDT by telephone and verified that I am speaking with the correct person using two identifiers.  Location: Patient: Home Provider: Clinic   I discussed the limitations, risks, security and privacy concerns of performing an evaluation and management service by telephone and the availability of in person appointments. I also discussed with the patient that there may be a patient responsible charge related to this service. The patient expressed understanding and agreed to proceed.   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  Patient has also had chronic abdominal pain.  She was referred to palliative care to help address goals and manage ongoing symptoms.   Observations/Objective: Follow-up telephone visit to address pain management.  Patient states that pain is improved on hydromorphone.  She denies severe adverse effects.  Was previously having nausea with oxycodone but is tolerating hydromorphone well.  Patient states at worst, pain is 10 out of 10 but will lessen to 6 out of 10 after taking hydromorphone.  However, she describes the effects of short-lived.  She is currently taking hydromorphone every 4 hours around-the-clock.  Total oral MME estimated to be 60 mg in 24 hours.  Will start patient on transdermal fentanyl 25 mcg every 72 hours.  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  Patient sees Dr. Donneta Romberg  tomorrow.   Neoplasm related pain-start transdermal fentanyl 25 mcg every 72 hours #5.  Refill hydromorphone 2 mg every 4 hours as needed for breakthrough pain #60.  Daily bowel regimen to prevent opioid-induced constipation.  PDMP reviewed.  Naloxone.  Safe storage and administration of pain medications reviewed with patient.  Follow Up Instructions: Follow-up 1-2 weeks   I discussed the assessment and treatment plan with the patient. The patient was provided an opportunity to ask questions and all were answered. The patient agreed with the plan and demonstrated an understanding of the instructions.   The patient was advised to call back or seek an in-person evaluation if the symptoms worsen or if the condition fails to improve as anticipated.  I provided 10 minutes of non-face-to-face time during this encounter.   Malachy Moan, NP

## 2024-03-27 NOTE — Telephone Encounter (Signed)
 Patient called and asked if I could get a message to [place]. She asked if Josh could send her in a prescription for pain meds that were longer acting. I told her that I would get the message to [facility]. Let me know if there is anything I can do to help.

## 2024-03-27 NOTE — Telephone Encounter (Signed)
 Patient notified

## 2024-03-27 NOTE — Telephone Encounter (Signed)
 [name], the rx Narcan is not covered by [employer], not needing PA.  Rx is $72 and only suggestion pharmacy has is for patient to buy OTC which cost around $45.00.

## 2024-03-27 NOTE — Telephone Encounter (Signed)
 Ms. [name] called to say the medication you called in is not covered by insurance and she request a call back to help with that.    [phone]

## 2024-03-28 ENCOUNTER — Encounter: Payer: Self-pay | Admitting: Internal Medicine

## 2024-03-28 ENCOUNTER — Other Ambulatory Visit: Payer: Self-pay | Admitting: Internal Medicine

## 2024-03-28 ENCOUNTER — Inpatient Hospital Stay (HOSPITAL_BASED_OUTPATIENT_CLINIC_OR_DEPARTMENT_OTHER): Admitting: Internal Medicine

## 2024-03-28 ENCOUNTER — Inpatient Hospital Stay

## 2024-03-28 ENCOUNTER — Other Ambulatory Visit: Payer: Self-pay

## 2024-03-28 VITALS — BP 118/89 | HR 106 | Temp 96.6°F | Resp 24 | Ht 63.0 in | Wt 218.7 lb

## 2024-03-28 VITALS — BP 121/81 | HR 77 | Resp 18

## 2024-03-28 DIAGNOSIS — Z7189 Other specified counseling: Secondary | ICD-10-CM

## 2024-03-28 DIAGNOSIS — Z79899 Other long term (current) drug therapy: Secondary | ICD-10-CM | POA: Diagnosis not present

## 2024-03-28 DIAGNOSIS — C482 Malignant neoplasm of peritoneum, unspecified: Secondary | ICD-10-CM

## 2024-03-28 DIAGNOSIS — Z5111 Encounter for antineoplastic chemotherapy: Secondary | ICD-10-CM | POA: Diagnosis present

## 2024-03-28 LAB — CMP (CANCER CENTER ONLY)
ALT: 15 U/L (ref 0–44)
AST: 21 U/L (ref 15–41)
Albumin: 3.5 g/dL (ref 3.5–5.0)
Alkaline Phosphatase: 80 U/L (ref 38–126)
Anion gap: 10 (ref 5–15)
BUN: 12 mg/dL (ref 6–20)
CO2: 23 mmol/L (ref 22–32)
Calcium: 8.8 mg/dL — ABNORMAL LOW (ref 8.9–10.3)
Chloride: 103 mmol/L (ref 98–111)
Creatinine: 0.86 mg/dL (ref 0.44–1.00)
GFR, Estimated: >60 mL/min (ref >60)
Glucose, Bld: 112 mg/dL — ABNORMAL HIGH (ref 70–99)
Potassium: 3 mmol/L — ABNORMAL LOW (ref 3.5–5.1)
Sodium: 136 mmol/L (ref 135–145)
Total Bilirubin: 0.6 mg/dL (ref 0.0–1.2)
Total Protein: 6.8 g/dL (ref 6.5–8.1)

## 2024-03-28 LAB — CBC WITH DIFFERENTIAL (CANCER CENTER ONLY)
Abs Immature Granulocytes: 0.05 10*3/uL (ref 0.00–0.07)
Basophils Absolute: 0.1 10*3/uL (ref 0.0–0.1)
Basophils Relative: 1 %
Eosinophils Absolute: 0.1 10*3/uL (ref 0.0–0.5)
Eosinophils Relative: 1 %
HCT: 39.4 % (ref 36.0–46.0)
Hemoglobin: 12.8 g/dL (ref 12.0–15.0)
Immature Granulocytes: 1 %
Lymphocytes Relative: 20 %
Lymphs Abs: 1.3 10*3/uL (ref 0.7–4.0)
MCH: 31.4 pg (ref 26.0–34.0)
MCHC: 32.5 g/dL (ref 30.0–36.0)
MCV: 96.8 fL (ref 80.0–100.0)
Monocytes Absolute: 0.5 10*3/uL (ref 0.1–1.0)
Monocytes Relative: 8 %
Neutro Abs: 4.3 10*3/uL (ref 1.7–7.7)
Neutrophils Relative %: 69 %
Platelet Count: 191 10*3/uL (ref 150–400)
RBC: 4.07 MIL/uL (ref 3.87–5.11)
RDW: 13.5 % (ref 11.5–15.5)
WBC Count: 6.2 10*3/uL (ref 4.0–10.5)
nRBC: 0 % (ref 0.0–0.2)

## 2024-03-28 MED ORDER — HYDROMORPHONE HCL 2 MG PO TABS: 2.0000 mg | ORAL_TABLET | ORAL | 0 refills | Status: DC | PRN

## 2024-03-28 MED ORDER — HEPARIN SOD (PORK) LOCK FLUSH 100 UNIT/ML IV SOLN
500.0000 [IU] | Freq: Once | INTRAVENOUS | Status: AC | PRN
  Administered 2024-03-28: 500 [IU]
  Filled 2024-03-28: qty 5

## 2024-03-28 MED ORDER — SODIUM CHLORIDE 0.9 % IV SOLN
800.0000 mg/m2 | Freq: Once | INTRAVENOUS | Status: AC
  Administered 2024-03-28: 1673 mg via INTRAVENOUS
  Filled 2024-03-28: qty 44

## 2024-03-28 MED ORDER — POTASSIUM CHLORIDE 20 MEQ/15ML (10%) PO SOLN: 20.0000 meq | Freq: Two times a day (BID) | ORAL | 1 refills | Status: DC

## 2024-03-28 MED ORDER — SODIUM CHLORIDE 0.9 % IV SOLN
INTRAVENOUS | Status: DC
  Filled 2024-03-28: qty 250

## 2024-03-28 MED ORDER — PROCHLORPERAZINE MALEATE 10 MG PO TABS
10.0000 mg | ORAL_TABLET | Freq: Once | ORAL | Status: AC
  Administered 2024-03-28: 10 mg via ORAL
  Filled 2024-03-28: qty 1

## 2024-03-28 NOTE — Progress Notes (Signed)
 Called in prescription for potassium liquid 20 mEq twice daily  GB

## 2024-03-28 NOTE — Assessment & Plan Note (Signed)
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  # CT scan March 22, 2024-Diffuse peritoneal carcinomatosis with moderate abdominal and pelvic free fluid and nodularity demonstrated throughout the peritoneum, omentum, along the hemidiaphragms, and in the pelvis.  Ventral abdominal wall hernia containing small bowel without evidence of obstruction.  No evidence of significant pulmonary embolus;  Small bilateral pleural effusions with some basilar atelectasis,similar to prior study. No metastatic disease demonstrated in the chest.  Folate alpha IHC testing pending- Also discussed with gynecology oncology.  10/03/2023- Left ventricular ejection fraction, by estimation, is 55 to 60%.   # Proceed with gemcitabine single agent cycle #1 day 1 day 8-every 21 days CBC CMP are reviewed.  #  I reviewed at length the individual components with chemotherapy; and the schedule in detail.  I also discussed the potential side effects including but not limited to-increasing fatigue, nausea vomiting, diarrhea, hair loss, sores in the mouth, increase risk of infection .  Also discussed risk of skin rash and also possible fever.  # Hypokalemia-patient noncompliant with Kdur BID-will send prescription for potassium liquid.  # constipation- miralax prn- Stable.   # Hypocalcemia-pending-Vit D levels; Hypokalemia- severe -3.3  -potassium.  Continue  K-Dur twice daily; Continue slow Mag. Stable.   # Ongoing MSK joints/abdominal pain--secondary malignancy malignancy.  On oxycodone /Dr. Borders- Stable.   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 No new or acute finding.  Currently on xarelto-  Stable.     # PN G-2/back pain- on Lyrica 50 mg TID/Cymbalta- Stable.    # Depression/  Anxiety/Insomnia-status post evaluation with February 2025 psychiatry/cerula care.  Continue Cymbalta [at 60 mg/day]; continue trazadone    300 mg at bedtime; vraylar-/wellbutrin  s/p cerula care. Stable.   # Hot flashes: on Clonidine patch- on Friday weekly; and clonidine pill 0.1 qhs.Stable.    # ACP: Discussed advance care planning-patient for now wants to continue full code.  #IV access/Mediport-currently s/p TPA- port flush.  Stable.     # DISPOSITION:  # gem-chemo today # 1 week- /port-labs- cbc/cmp; chemo-gem- # in 3 weeks-MD;  port-labs- cbc/cmp; ca 125; chemo-gem #  4 weeks-  port-labs- cbc/cmp; chemo-gem- Dr.B   # 40 minutes face-to-face with the patient discussing the above plan of care; more than 50% of time spent on prognosis/ natural history; counseling and coordination.

## 2024-03-28 NOTE — Addendum Note (Signed)
 Addended by: Darrold Span A on: 03/28/2024 10:49 AM   Modules accepted: Orders

## 2024-03-28 NOTE — Telephone Encounter (Signed)
 [facility]  Patient [facility] and they [facility] in [facility].  Pharmacy changed and pended for you.

## 2024-03-28 NOTE — Patient Instructions (Signed)
 [facility] - A DEPT OF [facility]  Discharge Instructions: Thank you for choosing [facility] to provide your oncology and hematology care.  If you have a lab appointment with the Cancer Center, please go directly to the Cancer Center and check in at the registration area.  Wear comfortable clothing and clothing appropriate for easy access to any Portacath or PICC line.   We strive to give you quality time with your provider. You may need to reschedule your appointment if you arrive late (15 or more minutes).  Arriving late affects you and other patients whose appointments are after yours.  Also, if you miss three or more appointments without notifying the office, you may be dismissed from the clinic at the provider's discretion.      For prescription refill requests, have your pharmacy contact our office and allow 72 hours for refills to be completed.    Today you received the following chemotherapy and/or immunotherapy agents gemzar     To help prevent nausea and vomiting after your treatment, we encourage you to take your nausea medication as directed.  BELOW ARE SYMPTOMS THAT SHOULD BE REPORTED IMMEDIATELY: *FEVER GREATER THAN 100.4 F (38 C) OR HIGHER *CHILLS OR SWEATING *NAUSEA AND VOMITING THAT IS NOT CONTROLLED WITH YOUR NAUSEA MEDICATION *UNUSUAL SHORTNESS OF BREATH *UNUSUAL BRUISING OR BLEEDING *URINARY PROBLEMS (pain or burning when urinating, or frequent urination) *BOWEL PROBLEMS (unusual diarrhea, constipation, pain near the anus) TENDERNESS IN MOUTH AND THROAT WITH OR WITHOUT PRESENCE OF ULCERS (sore throat, sores in mouth, or a toothache) UNUSUAL RASH, SWELLING OR PAIN  UNUSUAL VAGINAL DISCHARGE OR ITCHING   Items with * indicate a potential emergency and should be followed up as soon as possible or go to the Emergency Department if any problems should occur.  Please show the CHEMOTHERAPY ALERT CARD or IMMUNOTHERAPY ALERT  CARD at check-in to the Emergency Department and triage nurse.  Should you have questions after your visit or need to cancel or reschedule your appointment, please contact [facility] - A DEPT OF [facility]  [phone] and follow the prompts.  Office hours are 8:00 a.m. to 4:30 p.m. Monday - Friday. Please note that voicemails left after 4:00 p.m. may not be returned until the following business day.  We are closed weekends and major holidays. You have access to a nurse at all times for urgent questions. Please call the main number to the clinic [phone] and follow the prompts.  For any non-urgent questions, you may also contact your provider using MyChart. We now offer e-Visits for anyone 49 and older to request care online for non-urgent symptoms. For details visit [email or web address].   Also download the MyChart app! Go to the app store, search "MyChart", open the app, select [facility], and log in with your MyChart username and password.   Gemcitabine Injection What is this medication? GEMCITABINE (jem SYE ta been) treats some types of cancer. It works by slowing down the growth of cancer cells. This medicine may be used for other purposes; ask your health care provider or pharmacist if you have questions. COMMON BRAND NAME(S): Gemzar, Infugem What should I tell my care team before I take this medication? They need to know if you have any of these conditions: Blood disorders Infection Kidney disease Liver disease Lung or breathing disease, such as asthma or COPD Recent or ongoing radiation therapy An unusual or allergic  reaction to gemcitabine, other medications, foods, dyes, or preservatives If you or your partner are pregnant or trying to get pregnant Breast-feeding How should I use this medication? This medication is injected into a vein. It is given by your care team in a hospital or clinic setting. Talk to your care team about the use  of this medication in children. Special care may be needed. Overdosage: If you think you have taken too much of this medicine contact a poison control center or emergency room at once. NOTE: This medicine is only for you. Do not share this medicine with others. What if I miss a dose? Keep appointments for follow-up doses. It is important not to miss your dose. Call your care team if you are unable to keep an appointment. What may interact with this medication? Interactions have not been studied. This list may not describe all possible interactions. Give your health care provider a list of all the medicines, herbs, non-prescription drugs, or dietary supplements you use. Also tell them if you smoke, drink alcohol, or use illegal drugs. Some items may interact with your medicine. What should I watch for while using this medication? Your condition will be monitored carefully while you are receiving this medication. This medication may make you feel generally unwell. This is not uncommon, as chemotherapy can affect healthy cells as well as cancer cells. Report any side effects. Continue your course of treatment even though you feel ill unless your care team tells you to stop. In some cases, you may be given additional medications to help with side effects. Follow all directions for their use. This medication may increase your risk of getting an infection. Call your care team for advice if you get a fever, chills, sore throat, or other symptoms of a cold or flu. Do not treat yourself. Try to avoid being around people who are sick. This medication may increase your risk to bruise or bleed. Call your care team if you notice any unusual bleeding. Be careful brushing or flossing your teeth or using a toothpick because you may get an infection or bleed more easily. If you have any dental work done, tell your dentist you are receiving this medication. Avoid taking medications that contain aspirin, acetaminophen,  ibuprofen, naproxen, or ketoprofen unless instructed by your care team. These medications may hide a fever. Talk to your care team if you or your partner wish to become pregnant or think you might be pregnant. This medication can cause serious birth defects if taken during pregnancy and for 6 months after the last dose. A negative pregnancy test is required before starting this medication. A reliable form of contraception is recommended while taking this medication and for 6 months after the last dose. Talk to your care team about effective forms of contraception. Do not father a child while taking this medication and for 3 months after the last dose. Use a condom while having sex during this time period. Do not breastfeed while taking this medication and for at least 1 week after the last dose. This medication may cause infertility. Talk to your care team if you are concerned about your fertility. What side effects may I notice from receiving this medication? Side effects that you should report to your care team as soon as possible: Allergic reactions--skin rash, itching, hives, swelling of the face, lips, tongue, or throat Capillary leak syndrome--stomach or muscle pain, unusual weakness or fatigue, feeling faint or lightheaded, decrease in the amount of urine, swelling of  the ankles, hands, or feet, trouble breathing Infection--fever, chills, cough, sore throat, wounds that don't heal, pain or trouble when passing urine, general feeling of discomfort or being unwell Liver injury--right upper belly pain, loss of appetite, nausea, light-colored stool, dark yellow or brown urine, yellowing skin or eyes, unusual weakness or fatigue Low red blood cell level--unusual weakness or fatigue, dizziness, headache, trouble breathing Lung injury--shortness of breath or trouble breathing, cough, spitting up blood, chest pain, fever Stomach pain, bloody diarrhea, pale skin, unusual weakness or fatigue, decrease in  the amount of urine, which may be signs of hemolytic uremic syndrome Sudden and severe headache, confusion, change in vision, seizures, which may be signs of posterior reversible encephalopathy syndrome (PRES) Unusual bruising or bleeding Side effects that usually do not require medical attention (report to your care team if they continue or are bothersome): Diarrhea Drowsiness Hair loss Nausea Pain, redness, or swelling with sores inside the mouth or throat Vomiting This list may not describe all possible side effects. Call your doctor for medical advice about side effects. You may report side effects to FDA at 1-800-FDA-1088. Where should I keep my medication? This medication is given in a hospital or clinic. It will not be stored at home. NOTE: This sheet is a summary. It may not cover all possible information. If you have questions about this medicine, talk to your doctor, pharmacist, or health care provider.  2024 Elsevier/Gold Standard (2022-04-19 00:00:00)

## 2024-03-28 NOTE — Progress Notes (Signed)
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 significant  change in appearance since previous study. These may represent recurrent or metastatic lesions. Other: Moderate free fluid throughout the abdomen and pelvis with diffuse nodularity to the omentum and mesentery. Peritoneal nodularity along the right hemidiaphragm. Changes are consistent with peritoneal carcinomatosis. Similar appearance to previous study. No free air. Musculoskeletal: No acute or significant osseous findings. Review of the MIP images confirms the above findings. IMPRESSION: 1. No evidence of significant pulmonary embolus. 2. Small bilateral pleural effusions with some basilar atelectasis, similar to prior study. 3. No metastatic disease demonstrated in the chest. 4. Diffuse peritoneal carcinomatosis with moderate abdominal and pelvic free fluid and nodularity demonstrated throughout the peritoneum, omentum, along the  hemidiaphragms, and in the pelvis. Appearances are similar to prior study. 5. Cystic structures demonstrated in the pelvis are unchanged since prior study, possibly metastatic or recurrent lesions. 6. Ventral abdominal wall hernia containing small bowel without evidence of obstruction. Electronically Signed   By: Burman Nieves M.D.   On: 03/22/2024 00:42   CT ABDOMEN PELVIS W CONTRAST Result Date: 03/18/2024 CLINICAL DATA:  Polytrauma, blunt.  Stage IV ovarian cancer. EXAM: CT ABDOMEN AND PELVIS WITH CONTRAST TECHNIQUE: Multidetector CT imaging of the abdomen and pelvis was performed using the standard protocol following bolus administration of intravenous contrast. RADIATION DOSE REDUCTION: This exam was performed according to the departmental dose-optimization program which includes automated exposure control, adjustment of the mA and/or kV according to patient size and/or use of iterative reconstruction technique. CONTRAST:  OMNIPAQUE IOHEXOL 350 MG/ML SOLN COMPARISON:  CT abdomen pelvis 02/27/2024 FINDINGS: Lower chest: Please see separately dictated CT angiography chest 03/18/2024. Hepatobiliary: Not enlarged. Redemonstration of peripheral left lateral hepatic lobe mass measuring up to 2.5 cm (301:16). No laceration or subcapsular hematoma. The gallbladder is otherwise unremarkable with no radio-opaque gallstones. No biliary ductal dilatation. Pancreas: Normal pancreatic contour. No main pancreatic duct dilatation. Spleen: Not enlarged. No focal lesion. No laceration, subcapsular hematoma, or vascular injury. Adrenals/Urinary Tract: No nodularity bilaterally. Bilateral kidneys enhance symmetrically. No hydronephrosis. No contusion, laceration, or subcapsular hematoma. No injury to the vascular structures or collecting systems. No hydroureter. The urinary bladder is unremarkable. Stomach/Bowel: No small or large bowel wall thickening or dilatation. Similar-appearing fluid density lesions along the  sigmoid colon (306:66, 71). The appendix is unremarkable. Vasculature/Lymphatics: No abdominal aorta or iliac aneurysm. No active contrast extravasation or pseudoaneurysm. No abdominal, pelvic, inguinal lymphadenopathy. Reproductive: Status post hysterectomy. Other: Interval increase in small volume simple free fluid with associated peritoneal thickening most prominent along the right upper quadrant suggestive of peritoneal carcinomatosis. No pneumoperitoneum. No hemoperitoneum. No mesenteric hematoma identified. No organized fluid collection. Musculoskeletal: Redemonstration of moderate volume umbilical hernia containing short segment of small bowel and free fluid. Interval reduction of the transverse colon previously noted within the hernia. No significant soft tissue hematoma. No acute pelvic fracture. No spinal fracture. Other ports and devices: None. IMPRESSION: 1. No acute intra-abdominal, intrapelvic traumatic injury. 2. No acute fracture or traumatic malalignment of the lumbar spine. Other imaging findings of potential clinical significance: 1. Interval increase in small volume free fluid ascites with associated peritoneal carcinomatosis. 2. Redemonstration of nodule ear D related to patient's malignancy along the lateral left hepatic lobe and sigmoid colon. 3. Redemonstration of moderate volume umbilical hernia containing short segment of small bowel and free fluid. Interval reduction of the transverse colon previously noted within the hernia. No associated bowel obstruction or findings to suggest ischemia. Please see separately dictated CT angiography chest 03/18/2024. Electronically Signed  By: Tish Frederickson M.D.   On: 03/18/2024 01:05   CT Angio Chest PE W and/or Wo Contrast Result Date: 03/18/2024 CLINICAL DATA:  Recent syncopal episode with shortness of breath EXAM: CT ANGIOGRAPHY CHEST WITH CONTRAST TECHNIQUE: Multidetector CT imaging of the chest was performed using the standard protocol during  bolus administration of intravenous contrast. Multiplanar CT image reconstructions and MIPs were obtained to evaluate the vascular anatomy. RADIATION DOSE REDUCTION: This exam was performed according to the departmental dose-optimization program which includes automated exposure control, adjustment of the mA and/or kV according to patient size and/or use of iterative reconstruction technique. CONTRAST:  OMNIPAQUE IOHEXOL 350 MG/ML SOLN COMPARISON:  Chest x-ray from the prior day. FINDINGS: Cardiovascular: Thoracic aorta shows no aneurysmal dilatation or dissection. No cardiac enlargement is seen. The pulmonary artery shows a normal branching pattern bilaterally. No filling defect to suggest pulmonary embolism is noted. Right chest wall port is noted. Mediastinum/Nodes: Thoracic inlet is within normal limits. No hilar or mediastinal adenopathy is noted. The esophagus as visualized is within normal limits. Lungs/Pleura: Lungs are well aerated bilaterally. Small right-sided effusion and basilar atelectasis is noted. Minimal scarring in the left base is again seen laterally. No confluent infiltrate or sizable nodule is seen. Upper Abdomen: Mild perihepatic ascites is noted. Some hypodense nodularity in the anterior aspect of the left lobe of the liver is again noted and stable. Mild fluid surrounding the spleen is noted as well. Musculoskeletal: Chronic T8 compression fracture is noted. No acute bony abnormality is noted. Review of the MIP images confirms the above findings. IMPRESSION: No evidence of pulmonary emboli. Small right-sided effusion and basilar atelectasis. Mild ascites and nodularity along the anterior aspect of the left lobe of the liver similar to that seen on exam. Electronically Signed   By: Alcide Clever M.D.   On: 03/18/2024 00:59   DG Chest 2 View Result Date: 03/17/2024 CLINICAL DATA:  SOB Cancer patient. Abd pain, shob, ?ascites. Smoker. EXAM: CHEST - 2 VIEW COMPARISON:  CT chest  02/18/2024, chest x-ray 09/03/2023 FINDINGS: Right chest wall Port-A-Cath with tip overlying the right atrium. The heart and mediastinal contours are unchanged. Bilateral base linear atelectasis versus scarring. No focal consolidation. No pulmonary edema. No pleural effusion. No pneumothorax. No acute osseous abnormality. Chronic mid to lower thoracic spine vertebral body compression fracture. IMPRESSION: No active cardiopulmonary disease. Electronically Signed   By: Tish Frederickson M.D.   On: 03/17/2024 22:55   CT ABDOMEN PELVIS W CONTRAST Result Date: 03/05/2024 CLINICAL DATA:  Primary peritoneal carcinomatosis and rising CEA markers. * Tracking Code: BO * EXAM: CT ABDOMEN AND PELVIS WITH CONTRAST TECHNIQUE: Multidetector CT imaging of the abdomen and pelvis was performed using the standard protocol following bolus administration of intravenous contrast. RADIATION DOSE REDUCTION: This exam was performed according to the departmental dose-optimization program which includes automated exposure control, adjustment of the mA and/or kV according to patient size and/or use of iterative reconstruction technique. CONTRAST:  ISOVUE-300 IOPAMIDOL (ISOVUE-300) INJECTION 61% COMPARISON:  11/06/2023 FINDINGS: Lower chest: A presumed central catheter terminates in the vicinity of the tricuspid valve. Trace amount of gas in the right ventricle. This is typically incidental from IV placement and in the absence of a right to left vascular shunt is clinically insignificant. Mild cardiomegaly is present. Small right pleural effusion with associated passive atelectasis. Subsegmental atelectasis or scarring in both lower lobes. Hepatobiliary: Small amount of perihepatic ascites again observed along with hepatic capsular tumor deposition, for example along  the anterior margin of the lateral segment left hepatic lobe measuring 2.3 by 0.9 cm and previously the same on 02/18/2024 chest CT although increased from 11/06/2023. No  intraparenchymal lesion in the liver is observed. Gallbladder unremarkable. No biliary dilatation. Pancreas: Unremarkable Spleen: Unremarkable Adrenals/Urinary Tract: Unremarkable Stomach/Bowel: Normal amount of stool in the proximal colon although not the distal colon. A small portion of the transverse colon extends partially into a broad region of rectus diastasis on image 43 series 2. No dilated small bowel. Vascular/Lymphatic: Unremarkable Reproductive: Uterus absent. Specific ovarian tissue not identified. Along the upper margin of the vaginal cuff and eccentric to the right, a new 6.3 by 2.4 by 3.6 cm (volume = 29 cm^3) fluid density lesion with some thickening of the margins is visible. Other: In addition to the lesion extending cephalad from the right side of the vaginal cuff, another new contained appearing cystic lesion in the central pelvis measures 4.1 by 3.6 by 3.4 cm (volume = 26 cm^3) on image 66 series 2, without thickened margins except inferiorly. In addition to the perihepatic fluid signal in nodularity, there is some fluid signal anteriorly in the region of rectus diastasis on image 45 series 2 which is increased from 11/06/2023. Small amount of right anterior pelvic ascites, for example on image 69 series 2. Suspected tumor caking in the pelvis along the inferior margin of the sigmoid colon for example on image 60 of series 5. New (from 11/06/2023) tumor nodularity in the left upper quadrant for example on image 80 of series 5, with local tumor deposit measuring up to 1.1 cm in long axis. Musculoskeletal: Rectus diastasis as noted above, potentially with lax hernia mesh. Disc bulge and facet arthropathy cause mild bilateral foraminal stenosis at L4-5. IMPRESSION: 1. New (from 11/06/2023) tumor nodularity in the left upper quadrant, along the anterior margin of the lateral segment left hepatic lobe, and along the inferior margin of the sigmoid colon. 2. New cystic lesions in the pelvis with some  thickening along the margins, likely related to tumor deposits. 3. Small amount of perihepatic ascites and right anterior pelvic ascites. 4. Small right pleural effusion with associated passive atelectasis. 5. Mild cardiomegaly. 6. Disc bulge and facet arthropathy cause mild bilateral foraminal stenosis at L4-5. 7. A presumed central catheter terminates in the vicinity of the tricuspid valve, mildly low/distal in position and if central right atrial positioning is desired. Electronically Signed   By: Gaylyn Rong M.D.   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  # CT scan March 22, 2024-Diffuse peritoneal carcinomatosis with moderate abdominal and pelvic free fluid and nodularity demonstrated throughout the peritoneum, omentum, along the hemidiaphragms, and in the pelvis.  Ventral abdominal wall hernia containing small bowel without evidence of obstruction.  No evidence of significant pulmonary embolus;  Small bilateral pleural effusions with some basilar atelectasis,similar to prior study. No metastatic disease demonstrated in the chest.  Folate alpha IHC testing pending- Also discussed with gynecology oncology.  10/03/2023- Left ventricular ejection fraction, by estimation, is 55 to 60%.   # Proceed with gemcitabine single agent cycle #1 day 1 day 8-every 21 days CBC CMP are reviewed.  #  I reviewed at length the individual components with chemotherapy; and the schedule in detail.  I also discussed the potential side effects including but not limited to-increasing fatigue, nausea vomiting, diarrhea, hair loss,  sores in the mouth, increase risk of infection .  Also discussed risk of skin rash and also possible fever.  # Hypokalemia-patient noncompliant with Kdur BID-will send prescription for potassium liquid.  # constipation- miralax prn-  Stable.   # Hypocalcemia-pending-Vit D levels; Hypokalemia- severe -3.3  -potassium.  Continue  K-Dur twice daily; Continue slow Mag. Stable.   # Ongoing MSK joints/abdominal pain--secondary malignancy malignancy.  On oxycodone /Dr. Borders- Stable.   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 No new or acute finding.  Currently on xarelto-  Stable.     # PN G-2/back pain- on Lyrica 50 mg TID/Cymbalta- Stable.    # Depression/ Anxiety/Insomnia-status post evaluation with February 2025 psychiatry/cerula care.  Continue Cymbalta [at 60 mg/day]; continue trazadone    300 mg at bedtime; vraylar-/wellbutrin  s/p cerula care. Stable.   # Hot flashes: on Clonidine patch- on Friday weekly; and clonidine pill 0.1 qhs.Stable.    # ACP: Discussed advance care planning-patient for now wants to continue full code.  #IV access/Mediport-currently s/p TPA- port flush.  Stable.     # DISPOSITION:  # gem-chemo today # 1 week- /port-labs- cbc/cmp; chemo-gem- # in 3 weeks-MD;  port-labs- cbc/cmp; ca 125; chemo-gem #  4 weeks-  port-labs- cbc/cmp; chemo-gem- Dr.B   # 40 minutes face-to-face with the patient discussing the above plan of care; more than 50% of time spent on prognosis/ natural history; counseling and coordination.       Earna Coder, MD 03/28/2024 10:23 AM

## 2024-03-28 NOTE — Progress Notes (Signed)
 C/o abdominal pain 8/10. Having some nausea. Went to [facility] for pain last week. Had CT abd/pel, CT angio chest.  She is having trouble swallowing her potassium pill, is there a liquid rx? Hasn't had this med in over a week.  Now starting to have some SOB/fatigue with walking.

## 2024-03-31 ENCOUNTER — Encounter: Payer: Self-pay | Admitting: Internal Medicine

## 2024-03-31 NOTE — Progress Notes (Signed)
 Patient called regarding worsening rash 3 days post-gemcitabine chemotherapy.  Recommend Benadryl 50 mg twice a day if not improved to call us for further recommendations on Monday.  Please add MD visit to April 10th- chemo visit day I will add clinic visit tomorrow.   GB

## 2024-04-01 ENCOUNTER — Other Ambulatory Visit: Payer: Self-pay | Admitting: Internal Medicine

## 2024-04-01 DIAGNOSIS — C482 Malignant neoplasm of peritoneum, unspecified: Secondary | ICD-10-CM

## 2024-04-01 DIAGNOSIS — Z7189 Other specified counseling: Secondary | ICD-10-CM

## 2024-04-01 NOTE — Progress Notes (Signed)
 Clinic visit added for april10th- GB

## 2024-04-02 ENCOUNTER — Encounter: Payer: Self-pay | Admitting: Internal Medicine

## 2024-04-02 NOTE — Telephone Encounter (Signed)
 CC lab will check on delay of results

## 2024-04-03 ENCOUNTER — Telehealth: Payer: Self-pay | Admitting: Hospice and Palliative Medicine

## 2024-04-03 ENCOUNTER — Encounter: Payer: Self-pay | Admitting: Internal Medicine

## 2024-04-03 ENCOUNTER — Ambulatory Visit: Admitting: Internal Medicine

## 2024-04-03 ENCOUNTER — Other Ambulatory Visit

## 2024-04-03 ENCOUNTER — Ambulatory Visit

## 2024-04-03 MED ORDER — HYDROMORPHONE HCL 2 MG PO TABS
2.0000 mg | ORAL_TABLET | ORAL | Status: DC | PRN
Start: 2024-04-03 — End: 2024-04-24

## 2024-04-03 NOTE — Telephone Encounter (Signed)
 I received a call from patient.  She is still with persistent pain, unrelieved after starting transdermal fentanyl 25 mcg every 72 hours.  She is still taking hydromorphone 2 mg every 4 hours.  Suggested that she increase hydromorphone to 4 mg every 4 as needed.  Patient comes in the clinic tomorrow and we can consider also increasing her fentanyl patch.  Of note, patient states that she was seen in urgent care for a rash after receiving gemcitabine.  She is currently on prednisone taper.

## 2024-04-04 ENCOUNTER — Other Ambulatory Visit: Payer: Self-pay | Admitting: *Deleted

## 2024-04-04 ENCOUNTER — Inpatient Hospital Stay

## 2024-04-04 ENCOUNTER — Encounter: Payer: Self-pay | Admitting: Internal Medicine

## 2024-04-04 ENCOUNTER — Inpatient Hospital Stay (HOSPITAL_BASED_OUTPATIENT_CLINIC_OR_DEPARTMENT_OTHER): Admitting: Internal Medicine

## 2024-04-04 ENCOUNTER — Other Ambulatory Visit

## 2024-04-04 ENCOUNTER — Ambulatory Visit

## 2024-04-04 ENCOUNTER — Telehealth: Payer: Self-pay

## 2024-04-04 VITALS — BP 116/82 | HR 74 | Temp 96.6°F | Resp 28 | Ht 63.0 in | Wt 218.8 lb

## 2024-04-04 DIAGNOSIS — Z7189 Other specified counseling: Secondary | ICD-10-CM

## 2024-04-04 DIAGNOSIS — C482 Malignant neoplasm of peritoneum, unspecified: Secondary | ICD-10-CM | POA: Diagnosis not present

## 2024-04-04 DIAGNOSIS — Z5111 Encounter for antineoplastic chemotherapy: Secondary | ICD-10-CM | POA: Diagnosis not present

## 2024-04-04 LAB — CBC WITH DIFFERENTIAL (CANCER CENTER ONLY)
Abs Immature Granulocytes: 0.26 10*3/uL — ABNORMAL HIGH (ref 0.00–0.07)
Basophils Absolute: 0 10*3/uL (ref 0.0–0.1)
Basophils Relative: 0 %
Eosinophils Absolute: 0 10*3/uL (ref 0.0–0.5)
Eosinophils Relative: 0 %
HCT: 33.1 % — ABNORMAL LOW (ref 36.0–46.0)
Hemoglobin: 10.7 g/dL — ABNORMAL LOW (ref 12.0–15.0)
Immature Granulocytes: 4 %
Lymphocytes Relative: 19 %
Lymphs Abs: 1.3 10*3/uL (ref 0.7–4.0)
MCH: 31.2 pg (ref 26.0–34.0)
MCHC: 32.3 g/dL (ref 30.0–36.0)
MCV: 96.5 fL (ref 80.0–100.0)
Monocytes Absolute: 0.4 10*3/uL (ref 0.1–1.0)
Monocytes Relative: 6 %
Neutro Abs: 4.8 10*3/uL (ref 1.7–7.7)
Neutrophils Relative %: 71 %
Platelet Count: 123 10*3/uL — ABNORMAL LOW (ref 150–400)
RBC: 3.43 MIL/uL — ABNORMAL LOW (ref 3.87–5.11)
RDW: 13.5 % (ref 11.5–15.5)
WBC Count: 6.9 10*3/uL (ref 4.0–10.5)
nRBC: 0.6 % — ABNORMAL HIGH (ref 0.0–0.2)

## 2024-04-04 LAB — CMP (CANCER CENTER ONLY)
ALT: 17 U/L (ref 0–44)
AST: 28 U/L (ref 15–41)
Albumin: 3.1 g/dL — ABNORMAL LOW (ref 3.5–5.0)
Alkaline Phosphatase: 95 U/L (ref 38–126)
Anion gap: 8 (ref 5–15)
BUN: 13 mg/dL (ref 6–20)
CO2: 27 mmol/L (ref 22–32)
Calcium: 8.9 mg/dL (ref 8.9–10.3)
Chloride: 100 mmol/L (ref 98–111)
Creatinine: 0.84 mg/dL (ref 0.44–1.00)
GFR, Estimated: >60 mL/min (ref >60)
Glucose, Bld: 128 mg/dL — ABNORMAL HIGH (ref 70–99)
Potassium: 2.8 mmol/L — ABNORMAL LOW (ref 3.5–5.1)
Sodium: 135 mmol/L (ref 135–145)
Total Bilirubin: 0.4 mg/dL (ref 0.0–1.2)
Total Protein: 7 g/dL (ref 6.5–8.1)

## 2024-04-04 MED ORDER — FENTANYL 50 MCG/HR TD PT72: 1.0000 | MEDICATED_PATCH | TRANSDERMAL | 0 refills | Status: DC

## 2024-04-04 MED ORDER — HEPARIN SOD (PORK) LOCK FLUSH 100 UNIT/ML IV SOLN
500.0000 [IU] | Freq: Once | INTRAVENOUS | Status: AC
  Administered 2024-04-04: 500 [IU] via INTRAVENOUS
  Filled 2024-04-04: qty 5

## 2024-04-04 NOTE — Assessment & Plan Note (Addendum)
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  Currently on gemcitabine single agent   # CT scan March 22, 2024-Diffuse peritoneal carcinomatosis with moderate abdominal and pelvic free fluid and nodularity demonstrated throughout the peritoneum, omentum, along the hemidiaphragms, and in the pelvis.  Ventral abdominal wall hernia containing small bowel without evidence of obstruction.  No metastatic disease demonstrated in the chest.  Folate alpha IHC testing=5%.  Also discussed with gynecology oncology.  10/03/2023- Left ventricular ejection fraction, by estimation, is 55 to 60%.   # Currently status post gemcitabine single agent cycle #1 day 1 -approximately week ago.  Noted to have severe rash - see below.  Hold chemotherapy today.  # Skin rash-grade 2-3 secondary to gemcitabine-currently on prednisone.   # Hypokalemia-patient noncompliant with Kdur BID-will send prescription for potassium liquid.  # constipation- miralax prn- Stable.   # Hypocalcemia-pending-Vit D levels; Hypokalemia- severe -3.3  -potassium.  Continue  K-Dur twice daily; Continue slow Mag. Stable.   # Ongoing MSK joints/abdominal pain--secondary malignancy malignancy.  On  Fentanyl patch; and dilaudid Dr. 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 No new or acute finding.  Currently on xarelto-  Stable.     # PN G-2/back pain- on Lyrica 50 mg TID/Cymbalta- Stable.    # Depression/ Anxiety/Insomnia-status post evaluation with February 2025 psychiatry/cerula care.  Continue Cymbalta [at 60 mg/day]; continue trazadone    300 mg at bedtime; vraylar-/wellbutrin  s/p cerula care. Stable.   # Hot flashes: on Clonidine  patch- on Friday weekly; and clonidine pill 0.1 qhs.Stable.    # ACP: Discussed advance care planning-patient for now wants to continue full code.  #IV access/Mediport-currently s/p TPA- port flush.  Stable.     # DISPOSITION:  # HOLD chemo today; de-access # 1 week- MD; port- labs- cbc/cmp; Urine protein- taxol- bev  # 2 week-MD /port-labs- cbc/cmp;  chemo-taxol- # 3 weeks-MD;  port-labs- cbc/cmp; chemotaxol- bev  # 4 weeks- MD port-labs- cbc/cmp; chemo-taxol- Dr.B

## 2024-04-04 NOTE — Progress Notes (Unsigned)
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 carcinomatosis. Similar appearance to previous study. No free air. Musculoskeletal: No acute or significant osseous findings. Review of the MIP images confirms the above findings. IMPRESSION: 1. No evidence of significant  pulmonary embolus. 2. Small bilateral pleural effusions with some basilar atelectasis, similar to prior study. 3. No metastatic disease demonstrated in the chest. 4. Diffuse peritoneal carcinomatosis with moderate abdominal and pelvic free fluid and nodularity demonstrated throughout the peritoneum, omentum, along the hemidiaphragms, and in the pelvis. Appearances are similar to prior study. 5. Cystic structures demonstrated in the pelvis are unchanged since prior study, possibly metastatic or recurrent lesions. 6. Ventral abdominal wall hernia containing small bowel without evidence of obstruction. Electronically Signed   By: Burman Nieves M.D.   On: 03/22/2024 00:42   CT ABDOMEN PELVIS W CONTRAST Result Date: 03/18/2024 CLINICAL DATA:  Polytrauma, blunt.  Stage IV ovarian cancer. EXAM: CT ABDOMEN AND PELVIS WITH CONTRAST TECHNIQUE: Multidetector CT imaging of the abdomen and pelvis was performed using the standard protocol following bolus administration of intravenous contrast. RADIATION DOSE REDUCTION: This exam was performed according to the departmental dose-optimization program which includes automated exposure control, adjustment of the mA and/or kV according to patient size and/or use of iterative reconstruction technique. CONTRAST:  OMNIPAQUE IOHEXOL 350 MG/ML SOLN COMPARISON:  CT abdomen pelvis 02/27/2024 FINDINGS: Lower chest: Please see separately dictated CT angiography chest 03/18/2024. Hepatobiliary: Not enlarged. Redemonstration of peripheral left lateral hepatic lobe mass measuring up to 2.5 cm (301:16). No laceration or subcapsular hematoma. The gallbladder is otherwise unremarkable with no radio-opaque gallstones. No biliary ductal dilatation. Pancreas: Normal pancreatic contour. No main pancreatic duct dilatation. Spleen: Not enlarged. No focal lesion. No laceration, subcapsular hematoma, or vascular injury. Adrenals/Urinary Tract: No nodularity bilaterally. Bilateral kidneys enhance symmetrically. No hydronephrosis. No contusion, laceration, or subcapsular hematoma. No injury to the vascular structures or collecting systems. No hydroureter. The urinary bladder is unremarkable. Stomach/Bowel: No small or large bowel wall thickening or dilatation. Similar-appearing fluid density lesions along the sigmoid colon (306:66, 71). The appendix is unremarkable. Vasculature/Lymphatics: No abdominal aorta or iliac aneurysm. No active contrast extravasation or pseudoaneurysm. No abdominal, pelvic, inguinal lymphadenopathy. Reproductive: Status post hysterectomy. Other: Interval increase in small volume simple free fluid with  associated peritoneal thickening most prominent along the right upper quadrant suggestive of peritoneal carcinomatosis. No pneumoperitoneum. No hemoperitoneum. No mesenteric hematoma identified. No organized fluid collection. Musculoskeletal: Redemonstration of moderate volume umbilical hernia containing short segment of small bowel and free fluid. Interval reduction of the transverse colon previously noted within the hernia. No significant soft tissue hematoma. No acute pelvic fracture. No spinal fracture. Other ports and devices: None. IMPRESSION: 1. No acute intra-abdominal, intrapelvic traumatic injury. 2. No acute fracture or traumatic malalignment of the lumbar spine. Other imaging findings of potential clinical significance: 1. Interval increase in small volume free fluid ascites with associated peritoneal carcinomatosis. 2. Redemonstration of nodule ear D related to patient's malignancy along the lateral left hepatic lobe and sigmoid colon. 3. Redemonstration of moderate volume umbilical hernia containing short segment of small bowel and free fluid. Interval reduction of the transverse colon previously noted within the hernia. No associated bowel obstruction or findings to suggest ischemia. Please see separately dictated CT angiography chest 03/18/2024. Electronically Signed   By: Tish Frederickson M.D.   On: 03/18/2024 01:05   CT Angio Chest PE W and/or Wo Contrast Result Date: 03/18/2024 CLINICAL DATA:  Recent syncopal episode with shortness of breath EXAM: CT ANGIOGRAPHY CHEST WITH CONTRAST TECHNIQUE: Multidetector CT imaging of the chest was performed using the standard protocol during bolus administration of intravenous contrast. Multiplanar CT image reconstructions and MIPs were obtained to evaluate the vascular anatomy. RADIATION  DOSE REDUCTION: This exam was performed according to the departmental dose-optimization program which includes automated exposure control, adjustment of the mA and/or kV  according to patient size and/or use of iterative reconstruction technique. CONTRAST:  OMNIPAQUE IOHEXOL 350 MG/ML SOLN COMPARISON:  Chest x-ray from the prior day. FINDINGS: Cardiovascular: Thoracic aorta shows no aneurysmal dilatation or dissection. No cardiac enlargement is seen. The pulmonary artery shows a normal branching pattern bilaterally. No filling defect to suggest pulmonary embolism is noted. Right chest wall port is noted. Mediastinum/Nodes: Thoracic inlet is within normal limits. No hilar or mediastinal adenopathy is noted. The esophagus as visualized is within normal limits. Lungs/Pleura: Lungs are well aerated bilaterally. Small right-sided effusion and basilar atelectasis is noted. Minimal scarring in the left base is again seen laterally. No confluent infiltrate or sizable nodule is seen. Upper Abdomen: Mild perihepatic ascites is noted. Some hypodense nodularity in the anterior aspect of the left lobe of the liver is again noted and stable. Mild fluid surrounding the spleen is noted as well. Musculoskeletal: Chronic T8 compression fracture is noted. No acute bony abnormality is noted. Review of the MIP images confirms the above findings. IMPRESSION: No evidence of pulmonary emboli. Small right-sided effusion and basilar atelectasis. Mild ascites and nodularity along the anterior aspect of the left lobe of the liver similar to that seen on exam. Electronically Signed   By: Alcide Clever M.D.   On: 03/18/2024 00:59   DG Chest 2 View Result Date: 03/17/2024 CLINICAL DATA:  SOB Cancer patient. Abd pain, shob, ?ascites. Smoker. EXAM: CHEST - 2 VIEW COMPARISON:  CT chest 02/18/2024, chest x-ray 09/03/2023 FINDINGS: Right chest wall Port-A-Cath with tip overlying the right atrium. The heart and mediastinal contours are unchanged. Bilateral base linear atelectasis versus scarring. No focal consolidation. No pulmonary edema. No pleural effusion. No pneumothorax. No acute osseous abnormality.  Chronic mid to lower thoracic spine vertebral body compression fracture. IMPRESSION: No active cardiopulmonary disease. Electronically Signed   By: Tish Frederickson M.D.   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  Currently on gemcitabine single agent   # CT scan March 22, 2024-Diffuse peritoneal carcinomatosis with moderate abdominal and pelvic free fluid and nodularity demonstrated throughout the peritoneum, omentum, along the hemidiaphragms, and in the pelvis.  Ventral abdominal wall hernia containing small bowel without evidence of obstruction.  No metastatic disease demonstrated in the chest.  Folate alpha IHC testing=5%.  Also discussed with gynecology oncology.  10/03/2023- Left ventricular ejection fraction, by estimation, is 55 to 60%.   # Currently status post gemcitabine single agent cycle #1 day 1 -approximately week ago.  Noted to have severe rash - see below.  Hold chemotherapy today.  # Discussed regarding use of alternating chemotherapy with paclitaxel Avastin-patient's last exposure to Avastin in 2024.  And prior exposure to paclitaxel in 2021.  Again reviewed the potential side effects.  Understands disease is incurable.  # Skin rash-grade 2-3 secondary to gemcitabine-currently on prednisone.  Taper off when done.  # Hypokalemia-patient noncompliant with Kdur BID-will send prescription for potassium liquid.  # constipation- miralax prn- Stable.   # Hypocalcemia-pending-Vit D levels; Hypokalemia- severe -3.3  -potassium.  Continue  K-Dur twice daily; Continue slow Mag. Stable.   # Ongoing MSK joints/abdominal pain--secondary malignancy malignancy.  On  Fentanyl patch; and dilaudid Dr. 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 No new or acute finding.  Currently on xarelto-  Stable.     # PN G-2/back pain- on Lyrica 50 mg TID/Cymbalta- Stable.    # Depression/ Anxiety/Insomnia-status post evaluation with February 2025 psychiatry/cerula care.  Continue Cymbalta [at 60 mg/day]; continue trazadone    300 mg at bedtime; vraylar-/wellbutrin  s/p cerula care. Stable.   # Hot flashes: on Clonidine patch- on Friday weekly; and clonidine pill 0.1 qhs.Stable.    # ACP: Discussed advance care planning-patient for now wants to continue full code.  #IV access/Mediport-currently s/p TPA- port flush.  Stable.    # DISPOSITION:  # HOLD chemo today; de-access # 1 week- MD; port- labs- cbc/cmp; Urine protein- taxol- bev  # 2 week-MD /port-labs- cbc/cmp;  chemo-taxol- # 3 weeks-MD;  port-labs- cbc/cmp; chemotaxol- bev  # 4 weeks- MD port-labs- cbc/cmp; chemo-taxol- Dr.B   # 40 minutes face-to-face with the patient discussing the above plan of care; more than 50% of time spent on prognosis/ natural history; counseling and coordination.      Earna Coder, MD 04/05/2024 2:16 PM

## 2024-04-04 NOTE — Progress Notes (Unsigned)
 Pt states she had a reaction of a rash and itching from last chemo treatment. Urgent care gave her a prednisone taper, helping a little. She has 2 days left.  C/o abdomen pain 7/10. On fentanyl patch 25 mcg/hr and hydromorphone.  No BM in 2 weeks, using miralax and senna S.

## 2024-04-04 NOTE — Telephone Encounter (Signed)
 Per Amy CMA-pt sent mychart msg because she has medicaid, she is unable to fill her narcotic - fentanyl patch. Local pharmacy will not fill script. She has to use [facility].  Dr. Leonard Schwartz- I pended the script.

## 2024-04-04 NOTE — Telephone Encounter (Signed)
 CA125 has not resulted as of this am.  Called [facility] to check status--they have ran the test 6 times with an invalid result each time.  Dr. B notified and he would like CA125 to be drawn at visit today.

## 2024-04-04 NOTE — Progress Notes (Signed)
 DISCONTINUE OFF PATHWAY REGIMEN - Other   [id number]:Gemcitabine 800 mg/m2 IV D1,8 q21 Days:   A cycle is every 21 days:     Gemcitabine   **Always confirm dose/schedule in your pharmacy ordering system**  PRIOR TREATMENT: Gemcitabine 800 mg/m2 IV D1,8 q21 Days  START OFF PATHWAY REGIMEN - Other   OFF02338:Bevacizumab 10 mg/kg IV D1,15 + Paclitaxel 80 mg/m2 IV Z6,1,09,60 q28 Days:   A cycle is every 28 days:     Bevacizumab-xxxx      Paclitaxel   **Always confirm dose/schedule in your pharmacy ordering system**  Patient Characteristics: Intent of Therapy: Non-Curative / Palliative Intent, Discussed with Patient

## 2024-04-05 ENCOUNTER — Other Ambulatory Visit: Payer: Self-pay | Admitting: *Deleted

## 2024-04-05 ENCOUNTER — Encounter: Payer: Self-pay | Admitting: Internal Medicine

## 2024-04-05 ENCOUNTER — Telehealth: Payer: Self-pay | Admitting: *Deleted

## 2024-04-05 ENCOUNTER — Ambulatory Visit
Admission: RE | Admit: 2024-04-05 | Discharge: 2024-04-05 | Disposition: A | Discharge status: Home / Self Care | Source: Ambulatory Visit | Attending: Internal Medicine | Admitting: Internal Medicine

## 2024-04-05 ENCOUNTER — Inpatient Hospital Stay (HOSPITAL_BASED_OUTPATIENT_CLINIC_OR_DEPARTMENT_OTHER): Admitting: Hospice and Palliative Medicine

## 2024-04-05 DIAGNOSIS — C786 Secondary malignant neoplasm of retroperitoneum and peritoneum: Secondary | ICD-10-CM | POA: Diagnosis not present

## 2024-04-05 DIAGNOSIS — C569 Malignant neoplasm of unspecified ovary: Secondary | ICD-10-CM | POA: Insufficient documentation

## 2024-04-05 DIAGNOSIS — R140 Abdominal distension (gaseous): Secondary | ICD-10-CM | POA: Diagnosis present

## 2024-04-05 DIAGNOSIS — C482 Malignant neoplasm of peritoneum, unspecified: Secondary | ICD-10-CM

## 2024-04-05 DIAGNOSIS — R180 Malignant ascites: Secondary | ICD-10-CM

## 2024-04-05 DIAGNOSIS — Z515 Encounter for palliative care: Secondary | ICD-10-CM

## 2024-04-05 LAB — CA 125
Cancer Antigen (CA) 125: 7121 U/mL — ABNORMAL HIGH (ref 0.0–38.1)
Cancer Antigen (CA) 125: 4342 U/mL — ABNORMAL HIGH (ref 0.0–38.1)

## 2024-04-05 MED ORDER — LIDOCAINE HCL (PF) 1 % IJ SOLN
15.0000 mL | Freq: Once | INTRAMUSCULAR | Status: AC
  Administered 2024-04-05: 15 mL via SUBCUTANEOUS
  Filled 2024-04-05: qty 15

## 2024-04-05 NOTE — Telephone Encounter (Signed)
 I reached out to patient to discuss the outcome of her prior auth for fentanyl patch. Prescription approved by her insurance.  Pt requested that I call [facility] on [address] to confirm w/pharmacy that script was approved. I told the patient that we received a message yesterday that she needed her prescription sent to mail order pharmacy. Pt stated that this was incorrect and she never needed the script sent to mail order pharmacy. She just needed the PA submitted.  I personally reached out to her [facility] on [address] to let them know that the script was approved.  Also pt stated that she was feeling very distended/uncomfortable in her abdomen. "My belly feels hard and I think I needed to be tapped." Pt requested to have an u/s para today or Monday if possible. Pt has contemplated to going to the ER to have this done if her abdominal discomfort didn't improve. I spoke with Dr. Donneta Romberg. He gave a verbal order to proceed with u/s para today. Md stated that pt's abdominal pain may be related to her cancer vs ascites. I spoke with Beacher May to add on para today if patient can [facility] to [facility] no later than 330. Pt lives 30-40 mins away from [facility]. If patient arrives after 330, pt will need to be r/s to another day. No replacement albumin needed/no fluid limit at this time. Order placed. Pt gave verbal understanding to be at [facility] asap. She understands that if she arrives after 330 today, then she would have to r/s.

## 2024-04-05 NOTE — Procedures (Signed)
 PROCEDURE SUMMARY:  Patient with stage IV ovarian cancer and peritoneal carcinomatosis with malignant ascites presenting for paracentesis today. Last paracentesis 01/03/20 with 3.4L out.  Successful image-guided therapeutic paracentesis from the left lower abdomen.  Yielded 2.6 liters of clear, dark amber fluid.  No immediate complications.  EBL: zero Patient tolerated well.     Please see imaging section of Epic for full dictation.  Luverne Zerkle NP 04/05/2024 3:59 PM

## 2024-04-05 NOTE — Progress Notes (Signed)
VM left. Will reschedule. 

## 2024-04-05 NOTE — Telephone Encounter (Signed)
error 

## 2024-04-05 NOTE — Telephone Encounter (Signed)
 PA for fentanyl patch 50 mcg submitted  Paulino Rily (Key: [id number]) PA Case ID #: [id number]  [facility] is reviewing your PA request. Typically an electronic response will be received within 24-72 hours. To check for an update later, open this request from your dashboard.

## 2024-04-08 ENCOUNTER — Ambulatory Visit: Payer: 59 | Admitting: Internal Medicine

## 2024-04-08 ENCOUNTER — Encounter: Payer: Self-pay | Admitting: Internal Medicine

## 2024-04-08 ENCOUNTER — Other Ambulatory Visit: Payer: 59

## 2024-04-09 ENCOUNTER — Telehealth: Payer: Self-pay

## 2024-04-09 NOTE — Telephone Encounter (Signed)
 Fax received from [facility] requesting additional information for continual dispensing of opiates.    Called [facility] and they did receive rx for Fentanyl patch last week.  This medication is to be dispensed via local pharmacy not mail order.    I informed [facility] to cancel the Fentanyl rx sent last week.

## 2024-04-10 ENCOUNTER — Inpatient Hospital Stay (HOSPITAL_COMMUNITY)
Admission: EM | Admit: 2024-04-10 | Discharge: 2024-04-13 | DRG: 375 | Disposition: A | Discharge status: Home / Self Care | Attending: Internal Medicine | Admitting: Internal Medicine

## 2024-04-10 ENCOUNTER — Other Ambulatory Visit: Payer: Self-pay

## 2024-04-10 ENCOUNTER — Encounter (HOSPITAL_COMMUNITY): Payer: Self-pay

## 2024-04-10 DIAGNOSIS — Z823 Family history of stroke: Secondary | ICD-10-CM

## 2024-04-10 DIAGNOSIS — Z90710 Acquired absence of both cervix and uterus: Secondary | ICD-10-CM

## 2024-04-10 DIAGNOSIS — R112 Nausea with vomiting, unspecified: Secondary | ICD-10-CM | POA: Diagnosis present

## 2024-04-10 DIAGNOSIS — R140 Abdominal distension (gaseous): Secondary | ICD-10-CM | POA: Diagnosis present

## 2024-04-10 DIAGNOSIS — G4700 Insomnia, unspecified: Secondary | ICD-10-CM | POA: Diagnosis present

## 2024-04-10 DIAGNOSIS — Z803 Family history of malignant neoplasm of breast: Secondary | ICD-10-CM

## 2024-04-10 DIAGNOSIS — Z7901 Long term (current) use of anticoagulants: Secondary | ICD-10-CM

## 2024-04-10 DIAGNOSIS — G893 Neoplasm related pain (acute) (chronic): Principal | ICD-10-CM | POA: Diagnosis present

## 2024-04-10 DIAGNOSIS — D630 Anemia in neoplastic disease: Secondary | ICD-10-CM | POA: Diagnosis present

## 2024-04-10 DIAGNOSIS — Z806 Family history of leukemia: Secondary | ICD-10-CM

## 2024-04-10 DIAGNOSIS — Z86718 Personal history of other venous thrombosis and embolism: Secondary | ICD-10-CM

## 2024-04-10 DIAGNOSIS — F331 Major depressive disorder, recurrent, moderate: Secondary | ICD-10-CM | POA: Diagnosis present

## 2024-04-10 DIAGNOSIS — Z801 Family history of malignant neoplasm of trachea, bronchus and lung: Secondary | ICD-10-CM

## 2024-04-10 DIAGNOSIS — C569 Malignant neoplasm of unspecified ovary: Secondary | ICD-10-CM | POA: Diagnosis present

## 2024-04-10 DIAGNOSIS — E669 Obesity, unspecified: Secondary | ICD-10-CM | POA: Diagnosis present

## 2024-04-10 DIAGNOSIS — Z8042 Family history of malignant neoplasm of prostate: Secondary | ICD-10-CM

## 2024-04-10 DIAGNOSIS — C786 Secondary malignant neoplasm of retroperitoneum and peritoneum: Principal | ICD-10-CM | POA: Diagnosis present

## 2024-04-10 DIAGNOSIS — Z9221 Personal history of antineoplastic chemotherapy: Secondary | ICD-10-CM

## 2024-04-10 DIAGNOSIS — I10 Essential (primary) hypertension: Secondary | ICD-10-CM | POA: Diagnosis present

## 2024-04-10 DIAGNOSIS — R7303 Prediabetes: Secondary | ICD-10-CM | POA: Diagnosis present

## 2024-04-10 DIAGNOSIS — Z6838 Body mass index (BMI) 38.0-38.9, adult: Secondary | ICD-10-CM

## 2024-04-10 DIAGNOSIS — Z888 Allergy status to other drugs, medicaments and biological substances status: Secondary | ICD-10-CM

## 2024-04-10 DIAGNOSIS — K219 Gastro-esophageal reflux disease without esophagitis: Secondary | ICD-10-CM | POA: Diagnosis present

## 2024-04-10 DIAGNOSIS — Z85118 Personal history of other malignant neoplasm of bronchus and lung: Secondary | ICD-10-CM

## 2024-04-10 DIAGNOSIS — R180 Malignant ascites: Secondary | ICD-10-CM | POA: Diagnosis present

## 2024-04-10 DIAGNOSIS — Z79899 Other long term (current) drug therapy: Secondary | ICD-10-CM

## 2024-04-10 DIAGNOSIS — D649 Anemia, unspecified: Secondary | ICD-10-CM | POA: Diagnosis present

## 2024-04-10 DIAGNOSIS — Z1501 Genetic susceptibility to malignant neoplasm of breast: Secondary | ICD-10-CM

## 2024-04-10 DIAGNOSIS — Z809 Family history of malignant neoplasm, unspecified: Secondary | ICD-10-CM

## 2024-04-10 DIAGNOSIS — Z800 Family history of malignant neoplasm of digestive organs: Secondary | ICD-10-CM

## 2024-04-10 DIAGNOSIS — E785 Hyperlipidemia, unspecified: Secondary | ICD-10-CM | POA: Diagnosis present

## 2024-04-10 DIAGNOSIS — F17210 Nicotine dependence, cigarettes, uncomplicated: Secondary | ICD-10-CM | POA: Diagnosis present

## 2024-04-10 DIAGNOSIS — R109 Unspecified abdominal pain: Principal | ICD-10-CM | POA: Diagnosis present

## 2024-04-10 DIAGNOSIS — C801 Malignant (primary) neoplasm, unspecified: Secondary | ICD-10-CM | POA: Diagnosis present

## 2024-04-10 DIAGNOSIS — Z818 Family history of other mental and behavioral disorders: Secondary | ICD-10-CM

## 2024-04-10 DIAGNOSIS — I82409 Acute embolism and thrombosis of unspecified deep veins of unspecified lower extremity: Secondary | ICD-10-CM | POA: Diagnosis present

## 2024-04-10 MED ORDER — ONDANSETRON HCL 4 MG/2ML IJ SOLN
4.0000 mg | Freq: Once | INTRAMUSCULAR | Status: AC
  Administered 2024-04-10: 4 mg via INTRAVENOUS
  Filled 2024-04-10: qty 2

## 2024-04-10 MED ORDER — HYDROMORPHONE HCL 1 MG/ML IJ SOLN
1.0000 mg | Freq: Once | INTRAMUSCULAR | Status: AC
  Administered 2024-04-10: 1 mg via INTRAVENOUS
  Filled 2024-04-10: qty 1

## 2024-04-10 NOTE — ED Triage Notes (Signed)
 Pt has distended abdomen, last had fluid drained last week. Pt has CA and is to start chemo this week

## 2024-04-10 NOTE — ED Provider Notes (Signed)
 [place] EMERGENCY DEPARTMENT AT [facility] Provider Note   CSN: [id number] Arrival date & time: 04/10/24  2140     History  Chief Complaint  Patient presents with   Abdominal Pain    [name] is a 53 y.o. female, hx of peritoneal carcinomatosis, who presents to the ED 2/2 to severe abdominal distension and nausea and vomitingx1 week. She notes that she got tapped last week, but that her abdomen swelled up again and the pain is insufferable. Is set to begin chemo next week. Denies any fevers or chills.   See oncology at [facility].   Home Medications Prior to Admission medications   Medication Sig Start Date End Date Taking? Authorizing Provider  buPROPion  (WELLBUTRIN  XL) 300 MG 24 hr tablet Take 1 tablet (300 mg total) by mouth daily. 08/09/23   Sowles, Krichna, MD  cariprazine  (VRAYLAR ) 1.5 MG capsule Take 1 capsule (1.5 mg total) by mouth daily. 08/09/23   Sowles, Krichna, MD  cloNIDine  (CATAPRES  - DOSED IN MG/24 HR) 0.3 mg/24hr patch Place 1 patch (0.3 mg total) onto the skin once a week. Friday 07/31/23   Brahmanday, Govinda R, MD  cloNIDine  (CATAPRES ) 0.1 MG tablet Take 1 tablet (0.1 mg total) by mouth at bedtime. 07/31/23   Brahmanday, Govinda R, MD  docusate sodium  (COLACE) 100 MG capsule Take 100 mg by mouth 2 (two) times daily.    [provider]  DULoxetine  (CYMBALTA ) 60 MG capsule TAKE 1 CAPSULE BY MOUTH DAILY 09/25/23   Sowles, Krichna, MD  fentaNYL  (DURAGESIC ) 50 MCG/HR Place 1 patch onto the skin every 3 (three) days. 04/04/24   Borders, Carlene Che, NP  furosemide  (LASIX ) 20 MG tablet Take 1 tablet (20 mg total) by mouth daily. Patient not taking: Reported on 04/04/2024 08/29/23   Gwyn Leos, MD  HYDROmorphone  (DILAUDID ) 2 MG tablet Take 1-2 tablets (2-4 mg total) by mouth every 4 (four) hours as needed for severe pain (pain score 7-10). 04/03/24   Borders, Carlene Che, NP  LORazepam  (ATIVAN ) 0.5 MG tablet Take 1 tablet (0.5 mg  total) by mouth every 12 (twelve) hours as needed for anxiety. 10/21/22   Brahmanday, Govinda R, MD  Magnesium  Cl-Calcium  Carbonate (SLOW MAGNESIUM /CALCIUM ) 70-117 MG TBEC Take 1 tablet by mouth 2 (two) times daily. Patient not taking: Reported on 04/04/2024 08/03/23   Brahmanday, Govinda R, MD  meclizine  (ANTIVERT ) 25 MG tablet 1 pill at night as needed; if dizziness not improved can take one pill every 8 hours as needed/tolerated. Patient not taking: Reported on 03/28/2024 01/10/23   Brahmanday, Govinda R, MD  Multiple Vitamin (MULTIVITAMIN WITH MINERALS) TABS tablet Take 1 tablet by mouth daily.    [provider]  naloxone  (NARCAN ) nasal spray 4 mg/0.1 mL SPRAY 1 SPRAY INTO ONE NOSTRIL AS DIRECTED FOR OPIOID OVERDOSE (TURN PERSON ON SIDE AFTER DOSE. IF NO RESPONSE IN 2-3 MINUTES OR PERSON RESPONDS BUT RELAPSES, REPEAT USING A NEW SPRAY DEVICE AND SPRAY INTO THE OTHER NOSTRIL. CALL 911 AFTER USE.) * EMERGENCY USE ONLY * 03/27/24   Borders, Carlene Che, NP  nystatin  (MYCOSTATIN /NYSTOP ) powder Apply 1 Application topically 3 (three) times daily. 09/28/22   Sowles, Krichna, MD  omeprazole  (PRILOSEC) 20 MG capsule TAKE 1 CAPSULE BY MOUTH DAILY 09/25/23   Sowles, Krichna, MD  ondansetron  (ZOFRAN -ODT) 8 MG disintegrating tablet Take 1 tablet (8 mg total) by mouth every 8 (eight) hours as needed for nausea or vomiting. 07/31/23   Gwyn Leos, MD  Pegfilgrastim -cbqv (UDENYCA ) 6 MG/0.6ML SOAJ Inject 6 mg into the skin every 28 (twenty-eight) days. Patient not taking: Reported on 04/04/2024 11/06/23   Brahmanday, Govinda R, MD  polyethylene glycol powder (GLYCOLAX /MIRALAX ) 17 GM/SCOOP powder Take 0.5 Containers by mouth daily as needed for mild constipation or moderate constipation. 02/17/20   [provider]  potassium chloride  20 MEQ/15ML (10%) SOLN Take 15 mLs (20 mEq total) by mouth 2 (two) times daily. 03/28/24   Brahmanday, Govinda R, MD  pregabalin  (LYRICA ) 150 MG capsule Take 150 mg by mouth  3 (three) times daily.    E, Tanner Georgia , PA-C  prochlorperazine  (COMPAZINE ) 10 MG tablet Take 1 tablet (10 mg total) by mouth every 6 (six) hours as needed for nausea or vomiting. 07/31/23   Brahmanday, Govinda R, MD  rivaroxaban  (XARELTO ) 20 MG TABS tablet Take 1 tablet (20 mg total) by mouth daily with supper. 07/31/23   Brahmanday, Govinda R, MD  rosuvastatin  (CRESTOR ) 5 MG tablet TAKE 1 TABLET BY MOUTH ONCE  DAILY (IN PLACE OF ATORVASTATIN ) 09/25/23   Sowles, Krichna, MD  senna (SENOKOT) 8.6 MG tablet Take 1 tablet by mouth daily as needed for constipation.    [provider]  solifenacin  (VESICARE ) 10 MG tablet Take 10 mg by mouth daily. 02/14/24 02/13/25  [provider]  SUMAtriptan  (IMITREX ) 100 MG tablet Take 1 tablet (100 mg total) by mouth every 2 (two) hours as needed for migraine. May repeat in 2 hours if headache persists or recurs. 08/09/23   Sowles, Krichna, MD  Tiotropium Bromide  Monohydrate (SPIRIVA  RESPIMAT) 2.5 MCG/ACT AERS Inhale 2 puffs into the lungs daily. 08/09/23   Sowles, Krichna, MD  traZODone  (DESYREL ) 300 MG tablet Take 1 tablet (300 mg total) by mouth at bedtime as needed for sleep. 12/08/23   Brahmanday, Govinda R, MD  valACYclovir  (VALTREX ) 1000 MG tablet Take 1 tablet (1,000 mg total) by mouth 2 (two) times daily as needed. 09/27/23   Sowles, Krichna, MD  ranitidine  (ZANTAC ) 300 MG tablet Take 1 tablet (300 mg total) by mouth daily as needed for heartburn. 04/18/18 10/24/19  Sowles, Krichna, MD      Allergies    Hydroxyzine  hcl    Review of Systems   Review of Systems  Constitutional:  Negative for fever.  Gastrointestinal:  Positive for abdominal pain, nausea and vomiting.    Physical Exam Updated Vital Signs BP 103/70   Pulse 67   Temp 98.2 F (36.8 C) (Oral)   Resp 16   Ht 5\' 3"  (1.6 m)   Wt 98 kg   LMP 06/19/2017   SpO2 (!) 89%   BMI 38.26 kg/m  Physical Exam Vitals and nursing note reviewed.  Constitutional:      General: She is  not in acute distress.    Appearance: She is well-developed.  HENT:     Head: Normocephalic and atraumatic.  Eyes:     Conjunctiva/sclera: Conjunctivae normal.  Cardiovascular:     Rate and Rhythm: Normal rate and regular rhythm.     Heart sounds: No murmur heard. Pulmonary:     Effort: Pulmonary effort is normal. No respiratory distress.     Breath sounds: Normal breath sounds.  Abdominal:     General: There is distension.     Palpations: Abdomen is soft.     Tenderness: There is generalized abdominal tenderness.  Musculoskeletal:        General: No swelling.     Cervical back: Neck supple.  Skin:  General: Skin is warm and dry.     Capillary Refill: Capillary refill takes less than 2 seconds.  Neurological:     Mental Status: She is alert.  Psychiatric:        Mood and Affect: Mood normal.     ED Results / Procedures / Treatments   Labs (all labs ordered are listed, but only abnormal results are displayed) Labs Reviewed  CBC WITH DIFFERENTIAL/PLATELET - Abnormal; Notable for the following components:      Result Value   RBC 3.44 (*)    Hemoglobin 10.6 (*)    HCT 34.0 (*)    Abs Immature Granulocytes 0.08 (*)    All other components within normal limits  COMPREHENSIVE METABOLIC PANEL WITH GFR - Abnormal; Notable for the following components:   Glucose, Bld 109 (*)    Calcium  8.1 (*)    Total Protein 5.5 (*)    Albumin  2.7 (*)    All other components within normal limits  CBC WITH DIFFERENTIAL/PLATELET  URINALYSIS, ROUTINE W REFLEX MICROSCOPIC  HIV ANTIBODY (ROUTINE TESTING W REFLEX)    EKG None  Radiology No results found.  Procedures Procedures    Medications Ordered in ED Medications  HYDROmorphone  (DILAUDID ) injection 0.5 mg (has no administration in time range)  acetaminophen  (TYLENOL ) tablet 650 mg (has no administration in time range)  oxyCODONE  (Oxy IR/ROXICODONE ) immediate release tablet 5 mg (has no administration in time range)   HYDROmorphone  (DILAUDID ) injection 1 mg (1 mg Intravenous Given 04/10/24 2300)  ondansetron  (ZOFRAN ) injection 4 mg (4 mg Intravenous Given 04/10/24 2304)  diphenhydrAMINE  (BENADRYL ) injection 25 mg (25 mg Intravenous Given 04/11/24 0052)  HYDROmorphone  (DILAUDID ) injection 1 mg (1 mg Intravenous Given 04/11/24 0053)  HYDROmorphone  (DILAUDID ) injection 2 mg (2 mg Intravenous Given 04/11/24 0227)    ED Course/ Medical Decision Making/ A&P                                 Medical Decision Making Patient is a 53 year old female, history of peritoneal carcinomatosis, here for severe abdominal pain has been going for a week.  She states that she is collected more fluid, and feels like she needs to be tapped again.  She has been getting paracentesis, occasionally, for her malignant ascites.  Her abdomen is diffusely tender, but she has not had any fevers or chills, and has not been on chemo recently.  Is set to start chemo again next week.  Will obtain labs, and treat pain, will likely need IR to tap in a.m.  Amount and/or Complexity of Data Reviewed Labs: ordered.    Details: Labs unremarkable Discussion of management or test interpretation with external provider(s): Patient's pain intractable, has received 4 mg of Dilaudid  with minimal relief.  Believe that her pain is likely secondary to the large volume malignant ascites.  I discussed this with Dr. Del Favia, she will likely need to be put on a PCA pump, for pain control, and IR to perform paracentesis in a.m.  She has not had any fevers or chills and her labs are overall reassuring.  Admitted to Dr. Del Favia  Risk Prescription drug management. Decision regarding hospitalization.    Final Clinical Impression(s) / ED Diagnoses Final diagnoses:  Intractable abdominal pain  Malignant ascites    Rx / DC Orders ED Discharge Orders     None         Kanasia Gayman L, PA 04/11/24 0244  Lindle Rhea, MD 04/12/24 [phone]

## 2024-04-11 ENCOUNTER — Inpatient Hospital Stay (HOSPITAL_COMMUNITY)

## 2024-04-11 DIAGNOSIS — Z818 Family history of other mental and behavioral disorders: Secondary | ICD-10-CM | POA: Diagnosis not present

## 2024-04-11 DIAGNOSIS — Z1501 Genetic susceptibility to malignant neoplasm of breast: Secondary | ICD-10-CM | POA: Diagnosis not present

## 2024-04-11 DIAGNOSIS — E785 Hyperlipidemia, unspecified: Secondary | ICD-10-CM | POA: Diagnosis present

## 2024-04-11 DIAGNOSIS — D649 Anemia, unspecified: Secondary | ICD-10-CM | POA: Diagnosis present

## 2024-04-11 DIAGNOSIS — Z6838 Body mass index (BMI) 38.0-38.9, adult: Secondary | ICD-10-CM | POA: Diagnosis not present

## 2024-04-11 DIAGNOSIS — Z809 Family history of malignant neoplasm, unspecified: Secondary | ICD-10-CM | POA: Diagnosis not present

## 2024-04-11 DIAGNOSIS — E669 Obesity, unspecified: Secondary | ICD-10-CM | POA: Diagnosis present

## 2024-04-11 DIAGNOSIS — G893 Neoplasm related pain (acute) (chronic): Secondary | ICD-10-CM | POA: Diagnosis present

## 2024-04-11 DIAGNOSIS — R109 Unspecified abdominal pain: Principal | ICD-10-CM

## 2024-04-11 DIAGNOSIS — K219 Gastro-esophageal reflux disease without esophagitis: Secondary | ICD-10-CM | POA: Diagnosis present

## 2024-04-11 DIAGNOSIS — Z823 Family history of stroke: Secondary | ICD-10-CM | POA: Diagnosis not present

## 2024-04-11 DIAGNOSIS — C569 Malignant neoplasm of unspecified ovary: Secondary | ICD-10-CM | POA: Diagnosis present

## 2024-04-11 DIAGNOSIS — D630 Anemia in neoplastic disease: Secondary | ICD-10-CM | POA: Diagnosis present

## 2024-04-11 DIAGNOSIS — Z79899 Other long term (current) drug therapy: Secondary | ICD-10-CM | POA: Diagnosis not present

## 2024-04-11 DIAGNOSIS — I10 Essential (primary) hypertension: Secondary | ICD-10-CM | POA: Diagnosis present

## 2024-04-11 DIAGNOSIS — R7303 Prediabetes: Secondary | ICD-10-CM | POA: Diagnosis present

## 2024-04-11 DIAGNOSIS — Z803 Family history of malignant neoplasm of breast: Secondary | ICD-10-CM | POA: Diagnosis not present

## 2024-04-11 DIAGNOSIS — Z801 Family history of malignant neoplasm of trachea, bronchus and lung: Secondary | ICD-10-CM | POA: Diagnosis not present

## 2024-04-11 DIAGNOSIS — F331 Major depressive disorder, recurrent, moderate: Secondary | ICD-10-CM | POA: Diagnosis present

## 2024-04-11 DIAGNOSIS — I82409 Acute embolism and thrombosis of unspecified deep veins of unspecified lower extremity: Secondary | ICD-10-CM | POA: Diagnosis present

## 2024-04-11 DIAGNOSIS — Z7901 Long term (current) use of anticoagulants: Secondary | ICD-10-CM | POA: Diagnosis not present

## 2024-04-11 DIAGNOSIS — Z888 Allergy status to other drugs, medicaments and biological substances status: Secondary | ICD-10-CM | POA: Diagnosis not present

## 2024-04-11 DIAGNOSIS — F17210 Nicotine dependence, cigarettes, uncomplicated: Secondary | ICD-10-CM | POA: Diagnosis present

## 2024-04-11 DIAGNOSIS — R180 Malignant ascites: Secondary | ICD-10-CM | POA: Diagnosis present

## 2024-04-11 DIAGNOSIS — Z8042 Family history of malignant neoplasm of prostate: Secondary | ICD-10-CM | POA: Diagnosis not present

## 2024-04-11 DIAGNOSIS — Z800 Family history of malignant neoplasm of digestive organs: Secondary | ICD-10-CM | POA: Diagnosis not present

## 2024-04-11 DIAGNOSIS — C786 Secondary malignant neoplasm of retroperitoneum and peritoneum: Secondary | ICD-10-CM | POA: Diagnosis present

## 2024-04-11 LAB — CBC WITH DIFFERENTIAL/PLATELET
Abs Immature Granulocytes: 0.08 10*3/uL — ABNORMAL HIGH (ref 0.00–0.07)
Basophils Absolute: 0.1 10*3/uL (ref 0.0–0.1)
Basophils Relative: 1 %
Eosinophils Absolute: 0.2 10*3/uL (ref 0.0–0.5)
Eosinophils Relative: 2 %
HCT: 34 % — ABNORMAL LOW (ref 36.0–46.0)
Hemoglobin: 10.6 g/dL — ABNORMAL LOW (ref 12.0–15.0)
Immature Granulocytes: 1 %
Lymphocytes Relative: 20 %
Lymphs Abs: 1.4 10*3/uL (ref 0.7–4.0)
MCH: 30.8 pg (ref 26.0–34.0)
MCHC: 31.2 g/dL (ref 30.0–36.0)
MCV: 98.8 fL (ref 80.0–100.0)
Monocytes Absolute: 0.6 10*3/uL (ref 0.1–1.0)
Monocytes Relative: 9 %
Neutro Abs: 4.8 10*3/uL (ref 1.7–7.7)
Neutrophils Relative %: 67 %
Platelets: 166 10*3/uL (ref 150–400)
RBC: 3.44 MIL/uL — ABNORMAL LOW (ref 3.87–5.11)
RDW: 14.5 % (ref 11.5–15.5)
WBC: 7.1 10*3/uL (ref 4.0–10.5)
nRBC: 0 % (ref 0.0–0.2)

## 2024-04-11 LAB — COMPREHENSIVE METABOLIC PANEL WITH GFR
ALT: 13 U/L (ref 0–44)
AST: 16 U/L (ref 15–41)
Albumin: 2.7 g/dL — ABNORMAL LOW (ref 3.5–5.0)
Alkaline Phosphatase: 68 U/L (ref 38–126)
Anion gap: 7 (ref 5–15)
BUN: 13 mg/dL (ref 6–20)
CO2: 26 mmol/L (ref 22–32)
Calcium: 8.1 mg/dL — ABNORMAL LOW (ref 8.9–10.3)
Chloride: 105 mmol/L (ref 98–111)
Creatinine, Ser: 0.69 mg/dL (ref 0.44–1.00)
GFR, Estimated: >60 mL/min (ref >60)
Glucose, Bld: 109 mg/dL — ABNORMAL HIGH (ref 70–99)
Potassium: 3.8 mmol/L (ref 3.5–5.1)
Sodium: 138 mmol/L (ref 135–145)
Total Bilirubin: 0.5 mg/dL (ref 0.0–1.2)
Total Protein: 5.5 g/dL — ABNORMAL LOW (ref 6.5–8.1)

## 2024-04-11 LAB — BODY FLUID CELL COUNT WITH DIFFERENTIAL
Eos, Fluid: 0 %
Lymphs, Fluid: 33 %
Monocyte-Macrophage-Serous Fluid: 59 % (ref 50–90)
Neutrophil Count, Fluid: 8 % (ref 0–25)
Total Nucleated Cell Count, Fluid: 865 uL (ref 0–1000)

## 2024-04-11 LAB — URINALYSIS, ROUTINE W REFLEX MICROSCOPIC
Bilirubin Urine: NEGATIVE
Glucose, UA: NEGATIVE mg/dL
Hgb urine dipstick: NEGATIVE
Ketones, ur: NEGATIVE mg/dL
Leukocytes,Ua: NEGATIVE
Nitrite: NEGATIVE
Protein, ur: NEGATIVE mg/dL
Specific Gravity, Urine: 1.025 (ref 1.005–1.030)
pH: 5 (ref 5.0–8.0)

## 2024-04-11 MED ORDER — HYDROMORPHONE HCL 1 MG/ML IJ SOLN
0.5000 mg | INTRAMUSCULAR | Status: DC | PRN
  Administered 2024-04-11 (×3): 0.5 mg via INTRAVENOUS
  Filled 2024-04-11: qty 1
  Filled 2024-04-11 (×2): qty 0.5

## 2024-04-11 MED ORDER — SODIUM CHLORIDE 0.9% FLUSH
10.0000 mL | Freq: Two times a day (BID) | INTRAVENOUS | Status: DC
Start: 2024-04-11 — End: 2024-04-13
  Administered 2024-04-11 – 2024-04-12 (×2): 10 mL
  Administered 2024-04-12: 20 mL

## 2024-04-11 MED ORDER — PANTOPRAZOLE SODIUM 40 MG PO TBEC
40.0000 mg | DELAYED_RELEASE_TABLET | Freq: Every day | ORAL | Status: DC
  Administered 2024-04-11 – 2024-04-13 (×3): 40 mg via ORAL
  Filled 2024-04-11 (×3): qty 1

## 2024-04-11 MED ORDER — TENAPANOR HCL 50 MG PO TABS: 1.0000 | ORAL_TABLET | Freq: Every day | ORAL | Status: DC

## 2024-04-11 MED ORDER — DIPHENHYDRAMINE HCL 50 MG/ML IJ SOLN
25.0000 mg | Freq: Once | INTRAMUSCULAR | Status: AC
  Administered 2024-04-11: 25 mg via INTRAVENOUS
  Filled 2024-04-11: qty 1

## 2024-04-11 MED ORDER — ONDANSETRON HCL 4 MG/2ML IJ SOLN
4.0000 mg | Freq: Once | INTRAMUSCULAR | Status: AC
  Administered 2024-04-11: 4 mg via INTRAVENOUS
  Filled 2024-04-11: qty 2

## 2024-04-11 MED ORDER — DULOXETINE HCL 60 MG PO CPEP
60.0000 mg | ORAL_CAPSULE | Freq: Every day | ORAL | Status: DC
  Administered 2024-04-11 – 2024-04-13 (×3): 60 mg via ORAL
  Filled 2024-04-11 (×3): qty 1

## 2024-04-11 MED ORDER — HYDROMORPHONE HCL 1 MG/ML IJ SOLN
2.0000 mg | Freq: Once | INTRAMUSCULAR | Status: AC
  Administered 2024-04-11: 2 mg via INTRAVENOUS
  Filled 2024-04-11: qty 2

## 2024-04-11 MED ORDER — PROCHLORPERAZINE EDISYLATE 10 MG/2ML IJ SOLN: 5.0000 mg | Freq: Four times a day (QID) | INTRAMUSCULAR | Status: DC | PRN

## 2024-04-11 MED ORDER — RIVAROXABAN 10 MG PO TABS
20.0000 mg | ORAL_TABLET | Freq: Every day | ORAL | Status: DC
Start: 2024-04-11 — End: 2024-04-13
  Administered 2024-04-11 – 2024-04-12 (×2): 20 mg via ORAL
  Filled 2024-04-11 (×2): qty 2

## 2024-04-11 MED ORDER — LORAZEPAM 0.5 MG PO TABS: 0.5000 mg | ORAL_TABLET | Freq: Two times a day (BID) | ORAL | Status: DC | PRN

## 2024-04-11 MED ORDER — LIDOCAINE HCL 1 % IJ SOLN
INTRAMUSCULAR | Status: AC
  Filled 2024-04-11: qty 20

## 2024-04-11 MED ORDER — BUPROPION HCL ER (XL) 300 MG PO TB24: 300.0000 mg | ORAL_TABLET | Freq: Every day | ORAL | Status: DC

## 2024-04-11 MED ORDER — DIPHENHYDRAMINE HCL 25 MG PO CAPS
25.0000 mg | ORAL_CAPSULE | Freq: Four times a day (QID) | ORAL | Status: DC | PRN
  Administered 2024-04-11: 25 mg via ORAL
  Filled 2024-04-11: qty 1

## 2024-04-11 MED ORDER — CHLORHEXIDINE GLUCONATE CLOTH 2 % EX PADS
6.0000 | MEDICATED_PAD | Freq: Every day | CUTANEOUS | Status: DC
  Administered 2024-04-11 – 2024-04-13 (×3): 6 via TOPICAL

## 2024-04-11 MED ORDER — UMECLIDINIUM BROMIDE 62.5 MCG/ACT IN AEPB: 1.0000 | INHALATION_SPRAY | Freq: Every day | RESPIRATORY_TRACT | Status: DC | PRN

## 2024-04-11 MED ORDER — SODIUM CHLORIDE 0.9% FLUSH: 10.0000 mL | INTRAVENOUS | Status: DC | PRN

## 2024-04-11 MED ORDER — OXYCODONE HCL 5 MG PO TABS
5.0000 mg | ORAL_TABLET | Freq: Four times a day (QID) | ORAL | Status: DC | PRN
  Administered 2024-04-11: 5 mg via ORAL
  Filled 2024-04-11 (×2): qty 1

## 2024-04-11 MED ORDER — ACETAMINOPHEN 325 MG PO TABS: 650.0000 mg | ORAL_TABLET | Freq: Four times a day (QID) | ORAL | Status: DC | PRN

## 2024-04-11 MED ORDER — BUPROPION HCL ER (XL) 300 MG PO TB24
300.0000 mg | ORAL_TABLET | Freq: Every day | ORAL | Status: DC
  Administered 2024-04-12 – 2024-04-13 (×2): 300 mg via ORAL
  Filled 2024-04-11 (×2): qty 1

## 2024-04-11 MED ORDER — HYDROMORPHONE HCL 2 MG PO TABS
2.0000 mg | ORAL_TABLET | ORAL | Status: DC | PRN
  Administered 2024-04-11: 4 mg via ORAL
  Administered 2024-04-12: 2 mg via ORAL
  Administered 2024-04-12 – 2024-04-13 (×3): 4 mg via ORAL
  Filled 2024-04-11 (×2): qty 2
  Filled 2024-04-11 (×3): qty 1
  Filled 2024-04-11: qty 2

## 2024-04-11 MED ORDER — PROCHLORPERAZINE EDISYLATE 10 MG/2ML IJ SOLN: 10.0000 mg | Freq: Four times a day (QID) | INTRAMUSCULAR | Status: DC | PRN

## 2024-04-11 MED ORDER — ONDANSETRON HCL 4 MG/2ML IJ SOLN
4.0000 mg | Freq: Four times a day (QID) | INTRAMUSCULAR | Status: DC | PRN
  Administered 2024-04-12: 4 mg via INTRAVENOUS
  Filled 2024-04-11: qty 2

## 2024-04-11 MED ORDER — PREGABALIN 75 MG PO CAPS: 150.0000 mg | ORAL_CAPSULE | Freq: Three times a day (TID) | ORAL | Status: DC | PRN

## 2024-04-11 MED ORDER — HYDROMORPHONE HCL 1 MG/ML IJ SOLN
1.0000 mg | Freq: Once | INTRAMUSCULAR | Status: AC
  Administered 2024-04-11: 1 mg via INTRAVENOUS
  Filled 2024-04-11: qty 1

## 2024-04-11 MED ORDER — TIOTROPIUM BROMIDE MONOHYDRATE 2.5 MCG/ACT IN AERS
2.0000 | INHALATION_SPRAY | Freq: Every day | RESPIRATORY_TRACT | Status: DC | PRN
Start: 2024-04-11 — End: 2024-04-11

## 2024-04-11 MED ORDER — POLYETHYLENE GLYCOL 3350 17 G PO PACK: 17.0000 g | PACK | Freq: Every day | ORAL | Status: DC | PRN

## 2024-04-11 MED ORDER — CARIPRAZINE HCL 1.5 MG PO CAPS
1.5000 mg | ORAL_CAPSULE | Freq: Every day | ORAL | Status: DC
  Administered 2024-04-11 – 2024-04-13 (×3): 1.5 mg via ORAL
  Filled 2024-04-11 (×3): qty 1

## 2024-04-11 MED ORDER — ROSUVASTATIN CALCIUM 5 MG PO TABS
5.0000 mg | ORAL_TABLET | Freq: Every day | ORAL | Status: DC
  Administered 2024-04-11 – 2024-04-13 (×3): 5 mg via ORAL
  Filled 2024-04-11 (×3): qty 1

## 2024-04-11 MED ORDER — HYDROMORPHONE HCL 1 MG/ML IJ SOLN
1.0000 mg | INTRAMUSCULAR | Status: AC | PRN
  Administered 2024-04-11 (×3): 1 mg via INTRAVENOUS
  Filled 2024-04-11 (×3): qty 1

## 2024-04-11 MED ORDER — CLONIDINE HCL 0.1 MG PO TABS
0.1000 mg | ORAL_TABLET | Freq: Every day | ORAL | Status: DC
  Administered 2024-04-11 – 2024-04-12 (×2): 0.1 mg via ORAL
  Filled 2024-04-11 (×2): qty 1

## 2024-04-11 MED ORDER — TRAZODONE HCL 100 MG PO TABS
300.0000 mg | ORAL_TABLET | Freq: Every evening | ORAL | Status: DC | PRN
  Administered 2024-04-11 – 2024-04-12 (×2): 300 mg via ORAL
  Filled 2024-04-11 (×2): qty 3

## 2024-04-11 NOTE — Plan of Care (Signed)

## 2024-04-11 NOTE — H&P (Signed)
 History and Physical    Patient: [name] ZOX:[id number] DOB: Apr 05, 1971 DOA: 04/10/2024 DOS: the patient was seen and examined on 04/11/2024 PCP: [name], MD  Patient coming from: Home  Chief Complaint:  Chief Complaint  Patient presents with   Abdominal Pain   HPI: [name] is a 53 y.o. female with medical history significant of BRCA1 positive, right upper lobe bronchus cancer, DVT, depression, drug-induced alopecia, dyslipidemia, unspecified dysrhythmia, GERD, hypertension, insomnia, menorrhagia, migraine headaches, depression, osteoarthritis, ovarian cancer on chemotherapy who has presented with abdominal pain due to malignant ascites.  She had a therapeutic paracentesis 6 days ago with 2.6 L of fluid taken.  However, the patient stated that over the last few days she progressively started having more abdominal distention became very painful so she had to come to the emergency department.  She has been dyspneic, but no chest pain, palpitations, diaphoresis.  Also endorses nausea and several episodes of emesis.  No melena or hematochezia.  No flank pain, dysuria, frequency or hematuria.  No fever, sore throat, rhinorrhea, cough or wheezing.  Lab work: Urinalysis was hazy in appearance.  CBC showed white count 7.1, hemoglobin 10.6 g/dL platelets 811.  CMP showed normal renal function and electrolytes after calcium correction.  Glucose 109 mg/dL, total protein 5.5 and albumin 2.7 g/dL, the rest of the LFTs were normal.   ED course: Initial vital signs were temperature 98.5 F, pulse 84, respirations 22, BP 112 is 69 mmHg O2 sat 96% on room air.  At 1 point the patient's O2 sat decreased to 89% and she was transiently on 2 L of oxygen via nasal cannula.  Review of Systems: As mentioned in the history of present illness. All other systems reviewed and are negative. Past Medical History:  Diagnosis Date   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   Pathogenic BRCA1 mutation at [facility]    Cancer associated pain    Cancer of bronchus of right upper lobe (HCC) 12/11/2019   Clotting disorder (HCC)    Right arm blood clot when she started Chemo.   Depression    Drug-induced androgenic alopecia    Dyslipidemia 05/17/2022   Dysrhythmia    Family history of breast cancer    GERD (gastroesophageal reflux disease)    Goals of care, counseling/discussion 12/16/2019   Hypertension    Insomnia    Menorrhagia    Migraines    Moderate episode of recurrent major depressive disorder (HCC) 06/17/2022   Osteoarthritis    back   Ovarian cancer (HCC) 12/10/2019   Personal history of chemotherapy    ovarian cancer   Plantar fasciitis    Past Surgical History:  Procedure Laterality Date   ABDOMINAL HYSTERECTOMY  03/2020   APPENDECTOMY     LSC but "ruptured when they did the surgery"   BREAST BIOPSY Left 01/04/2021   MRI BX   CESAREAN SECTION     CYSTOSCOPY N/A 04/08/2020   Procedure: CYSTOSCOPY;  Surgeon: Artelia Laroche, MD;  [name];  Service: Gynecology;  [name];   INSERTION OF MESH N/A 04/26/2021   Procedure: INSERTION OF MESH;  Surgeon: Earline Mayotte, MD;  [name];  Service: General;  [name];   IR THORACENTESIS ASP PLEURAL SPACE W/IMG GUIDE  12/06/2019   IUD REMOVAL N/A 04/08/2020   Procedure: INTRAUTERINE DEVICE (IUD) REMOVAL;  Surgeon: Artelia Laroche, MD;  [name];  Service: Gynecology;  [name];   PARACENTESIS     x6  PORTA CATH INSERTION N/A 04/23/2020   Procedure: PORTA CATH INSERTION;  Surgeon: Celso College, MD;  Location: [facility];  Service: Cardiovascular;  [name];   TUBAL LIGATION     at time of CSxn   VENTRAL HERNIA REPAIR N/A 04/26/2021   Procedure: HERNIA REPAIR VENTRAL ADULT;  Surgeon: Marshall Skeeter, MD;  [name];  Service: General;  [name];  need RNFA for the case   WRIST SURGERY Left 11/21/2016   plates and screws inserted   Social  History:  reports that she has been smoking cigarettes. She has a 30 pack-year smoking history. She has never used smokeless tobacco. She reports that she does not currently use alcohol. She reports that she does not use drugs.  Allergies  Allergen Reactions   Hydroxyzine Hcl Other (See Comments)    Makes her loopy/jittery    Family History  Adopted: Yes  Problem Relation Age of Onset   Lung cancer Father        deceased 62   Breast cancer Mother 27       currently 53   Colon cancer Mother    ADD / ADHD Son    ADD / ADHD Son    Early death Maternal Aunt    Breast cancer Maternal Aunt 34       deceased 28   Breast cancer Maternal Grandmother    Depression Daughter    Depression Daughter    Prostate cancer Paternal Uncle    Stroke Paternal Uncle    Leukemia Paternal Aunt    Breast cancer Paternal Grandmother    Cancer Maternal Uncle     Prior to Admission medications   Medication Sig Start Date End Date Taking? Authorizing Provider  predniSONE (DELTASONE) 10 MG tablet Take 10 mg by mouth as directed. 04/02/24  Yes [provider]  buPROPion (WELLBUTRIN XL) 300 MG 24 hr tablet Take 1 tablet (300 mg total) by mouth daily. 08/09/23   Sowles, Krichna, MD  cariprazine (VRAYLAR) 1.5 MG capsule Take 1 capsule (1.5 mg total) by mouth daily. 08/09/23   Sowles, Krichna, MD  cloNIDine (CATAPRES - DOSED IN MG/24 HR) 0.3 mg/24hr patch Place 1 patch (0.3 mg total) onto the skin once a week. Friday 07/31/23   Brahmanday, Govinda R, MD  cloNIDine (CATAPRES) 0.1 MG tablet Take 1 tablet (0.1 mg total) by mouth at bedtime. 07/31/23   Brahmanday, Govinda R, MD  docusate sodium (COLACE) 100 MG capsule Take 100 mg by mouth 2 (two) times daily.    [provider]  DULoxetine (CYMBALTA) 60 MG capsule TAKE 1 CAPSULE BY MOUTH DAILY 09/25/23   Sowles, Krichna, MD  fentaNYL (DURAGESIC) 50 MCG/HR Place 1 patch onto the skin every 3 (three) days. 04/04/24   Borders, Carlene Che, NP  furosemide (LASIX)  20 MG tablet Take 1 tablet (20 mg total) by mouth daily. Patient not taking: Reported on 04/04/2024 08/29/23   Brahmanday, Govinda R, MD  HYDROmorphone (DILAUDID) 2 MG tablet Take 1-2 tablets (2-4 mg total) by mouth every 4 (four) hours as needed for severe pain (pain score 7-10). 04/03/24   Borders, Carlene Che, NP  LORazepam (ATIVAN) 0.5 MG tablet Take 1 tablet (0.5 mg total) by mouth every 12 (twelve) hours as needed for anxiety. 10/21/22   Brahmanday, Govinda R, MD  Magnesium Cl-Calcium Carbonate (SLOW MAGNESIUM/CALCIUM) 70-117 MG TBEC Take 1 tablet by mouth 2 (two) times daily. Patient not taking: Reported on 04/04/2024 08/03/23   Valentine Gasmen,  Worthy Flank, MD  meclizine (ANTIVERT) 25 MG tablet 1 pill at night as needed; if dizziness not improved can take one pill every 8 hours as needed/tolerated. Patient not taking: Reported on 03/28/2024 01/10/23   Earna Coder, MD  Multiple Vitamin (MULTIVITAMIN WITH MINERALS) TABS tablet Take 1 tablet by mouth daily.    [provider]  naloxone (NARCAN) nasal spray 4 mg/0.1 mL SPRAY 1 SPRAY INTO ONE NOSTRIL AS DIRECTED FOR OPIOID OVERDOSE (TURN PERSON ON SIDE AFTER DOSE. IF NO RESPONSE IN 2-3 MINUTES OR PERSON RESPONDS BUT RELAPSES, REPEAT USING A NEW SPRAY DEVICE AND SPRAY INTO THE OTHER NOSTRIL. CALL 911 AFTER USE.) * EMERGENCY USE ONLY * 03/27/24   Borders, [name], NP  nystatin (MYCOSTATIN/NYSTOP) powder Apply 1 Application topically 3 (three) times daily. 09/28/22   [name], MD  omeprazole (PRILOSEC) 20 MG capsule TAKE 1 CAPSULE BY MOUTH DAILY 09/25/23   [name], MD  ondansetron (ZOFRAN-ODT) 8 MG disintegrating tablet Take 1 tablet (8 mg total) by mouth every 8 (eight) hours as needed for nausea or vomiting. 07/31/23   Earna Coder, MD  Pegfilgrastim-cbqv (UDENYCA) 6 MG/0.6ML SOAJ Inject 6 mg into the skin every 28 (twenty-eight) days. Patient not taking: Reported on 04/04/2024 11/06/23   Earna Coder, MD  polyethylene  glycol powder (GLYCOLAX/MIRALAX) 17 GM/SCOOP powder Take 0.5 Containers by mouth daily as needed for mild constipation or moderate constipation. 02/17/20   [provider]  potassium chloride 20 MEQ/15ML (10%) SOLN Take 15 mLs (20 mEq total) by mouth 2 (two) times daily. 03/28/24   Earna Coder, MD  pregabalin (LYRICA) 150 MG capsule Take 150 mg by mouth 3 (three) times daily.    E, Tanner Cyprus, PA-C  prochlorperazine (COMPAZINE) 10 MG tablet Take 1 tablet (10 mg total) by mouth every 6 (six) hours as needed for nausea or vomiting. 07/31/23   Earna Coder, MD  rivaroxaban (XARELTO) 20 MG TABS tablet Take 1 tablet (20 mg total) by mouth daily with supper. 07/31/23   Earna Coder, MD  rosuvastatin (CRESTOR) 5 MG tablet TAKE 1 TABLET BY MOUTH ONCE  DAILY (IN PLACE OF ATORVASTATIN) 09/25/23   Sowles, Danna Hefty, MD  senna (SENOKOT) 8.6 MG tablet Take 1 tablet by mouth daily as needed for constipation.    [provider]  solifenacin (VESICARE) 10 MG tablet Take 10 mg by mouth daily. 02/14/24 02/13/25  [provider]  SUMAtriptan (IMITREX) 100 MG tablet Take 1 tablet (100 mg total) by mouth every 2 (two) hours as needed for migraine. May repeat in 2 hours if headache persists or recurs. 08/09/23   [name], MD  Tiotropium Bromide Monohydrate (SPIRIVA RESPIMAT) 2.5 MCG/ACT AERS Inhale 2 puffs into the lungs daily. 08/09/23   [name], MD  traZODone (DESYREL) 300 MG tablet Take 1 tablet (300 mg total) by mouth at bedtime as needed for sleep. 12/08/23   Earna Coder, MD  valACYclovir (VALTREX) 1000 MG tablet Take 1 tablet (1,000 mg total) by mouth 2 (two) times daily as needed. 09/27/23   [name], MD  ranitidine (ZANTAC) 300 MG tablet Take 1 tablet (300 mg total) by mouth daily as needed for heartburn. 04/18/18 10/24/19  [name], MD    Physical Exam: Vitals:   04/11/24 0341 04/11/24 0413 04/11/24 0428 04/11/24 0504  BP: 121/74   112/73 114/72  Pulse: 65  63 75  Resp: 18  18 18   Temp:  97.8 F (36.6 C)  98 F (36.7 C)  TempSrc:  Oral  Oral  SpO2: 95%  96% 92%  Weight:      Height:       Physical Exam Vitals and nursing note reviewed.  Constitutional:      General: She is awake. She is not in acute distress.    Appearance: She is well-developed. She is ill-appearing.  HENT:     Head: Normocephalic.     Nose: No rhinorrhea.     Mouth/Throat:     Mouth: Mucous membranes are moist.  Eyes:     General: No scleral icterus.    Pupils: Pupils are equal, round, and reactive to light.  Neck:     Vascular: No JVD.  Cardiovascular:     Rate and Rhythm: Normal rate and regular rhythm.     Heart sounds: S1 normal and S2 normal.  Pulmonary:     Effort: No respiratory distress.     Breath sounds: No wheezing, rhonchi or rales.  Abdominal:     General: There is distension.     Tenderness: There is abdominal tenderness.  Musculoskeletal:     Cervical back: Neck supple.     Right lower leg: No edema.     Left lower leg: No edema.  Skin:    General: Skin is warm and dry.  Neurological:     General: No focal deficit present.     Mental Status: She is alert and oriented to person, place, and time.  Psychiatric:        Mood and Affect: Mood normal.        Behavior: Behavior normal. Behavior is cooperative.     Data Reviewed:  Results are pending, will review when available.  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 Continue IV fluids. Keep n.p.o. for now. Analgesics as needed. Antiemetics as needed. Pantoprazole 40 mg IVP daily. Follow CBC, CMP in AM.  Active Problems:   GERD without esophagitis Continue PPI.    Dyslipidemia Continue rosuvastatin 5 mg p.o. daily.    Normocytic anemia Monitor hematocrit  hemoglobin. Transfuse as needed.    DVT (deep venous thrombosis) (HCC)  Continue Xarelto 20 mg p.o. daily.     Advance Care Planning:   Code Status: Full Code   Consults:   Family Communication:   Severity of Illness: The appropriate patient status for this patient is INPATIENT. Inpatient status is judged to be reasonable and necessary in order to provide the required intensity of service to ensure the patient's safety. The patient's presenting symptoms, physical exam findings, and initial radiographic and laboratory data in the context of their chronic comorbidities is felt to place them at high risk for further clinical deterioration. Furthermore, it is not anticipated that the patient will be medically stable for discharge from the hospital within 2 midnights of admission.   * I certify that at the point of admission it is my clinical judgment that the patient will require inpatient hospital care spanning beyond 2 midnights from the point of admission due to high intensity of service, high risk for further deterioration and high frequency of surveillance required.*  Author: Bobette Mo, MD 04/11/2024 8:33 AM  For on call review [email or web address].   This document was prepared using Dragon voice recognition software and may contain some unintended transcription errors.

## 2024-04-11 NOTE — ED Notes (Signed)
 Pt placed on 2 L/M nasal cannula due to Spo2 reading RA was 86-88, pt now reading 97%, pt currently denies any shortness of breath, NAD noted.

## 2024-04-11 NOTE — ED Notes (Addendum)
 ED TO INPATIENT HANDOFF REPORT  Name/Age/Gender [name] 53 y.o. female  Pt is A&Ox4, walks with standby/no assist.  Code Status    Code Status Orders  (From admission, onward)           Start     Ordered   04/11/24 0244  Full code  Continuous       Question:  By:  Answer:  Consent: discussion documented in EHR   04/11/24 0243           Code Status History     Date Active Date Inactive Code Status Order ID Comments User Context   01/10/2023 2034 01/12/2023 1813 Full Code [id number]  Rometta Emery, MD ED   04/26/2021 1339 04/28/2021 1819 Full Code [id number]  Earline Mayotte, MD Inpatient   04/08/2020 1516 04/12/2020 0117 Full Code [id number]  Christeen Douglas, MD Inpatient   12/22/2019 0349 12/24/2019 0426 Full Code [id number]  Andris Baumann, MD ED       Home/SNF/Other Home  Chief Complaint Intractable abdominal pain [R10.9]  Level of Care/Admitting Diagnosis ED Disposition     ED Disposition  Admit   Condition  --   Comment  Hospital Area: [facility] [[id number]]  Level of Care: Telemetry [5]  Admit to tele based on following criteria: Monitor for Ischemic changes  May admit patient to [facility] or [facility] if equivalent level of care is available:: Yes  Covid Evaluation: Asymptomatic - no recent exposure (last 10 days) testing not required  Diagnosis: Intractable abdominal pain [720109]  Admitting Physician: Darlin Drop [[id number]]  Attending Physician: Darlin Drop [[id number]]  Certification:: I certify this patient will need inpatient services for at least 2 midnights  Expected Medical Readiness: 04/13/2024          Medical History Past Medical History:  Diagnosis Date   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   Pathogenic BRCA1 mutation at [facility]   Cancer associated pain    Cancer of bronchus of right upper lobe (HCC) 12/11/2019   Clotting disorder (HCC)    Right arm blood clot when she started Chemo.   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/17/2022   Osteoarthritis    back   Ovarian cancer (HCC) 12/10/2019   Personal history of chemotherapy    ovarian cancer   Plantar fasciitis     Allergies Allergies  Allergen Reactions   Hydroxyzine Hcl Other (See Comments)    Makes her loopy/jittery    IV Location/Drains/Wounds Patient Lines/Drains/Airways Status     Active Line/Drains/Airways     Name Placement date Placement time Site Days   Implanted Port 04/23/20 Right Chest 04/23/20  1022  Chest  1449            Labs/Imaging Results for orders placed or performed during the hospital encounter of 04/10/24 (from the past 48 hours)  CBC with Differential/Platelet     Status: Abnormal   Collection Time: 04/11/24 12:33 AM  Result Value Ref Range   WBC 7.1 4.0 - 10.5 K/uL   RBC 3.44 (L) 3.87 - 5.11 MIL/uL   Hemoglobin 10.6 (L) 12.0 - 15.0 g/dL  HCT 34.0 (L) 36.0 - 46.0 %   MCV 98.8 80.0 - 100.0 fL   MCH 30.8 26.0 - 34.0 pg   MCHC 31.2 30.0 - 36.0 g/dL   RDW 40.9 81.1 - 91.4 %   Platelets 166 150 - 400 K/uL   nRBC 0.0 0.0 - 0.2 %   Neutrophils Relative % 67 %   Neutro Abs 4.8 1.7 - 7.7 K/uL   Lymphocytes Relative 20 %   Lymphs Abs 1.4 0.7 - 4.0 K/uL   Monocytes Relative 9 %   Monocytes Absolute 0.6 0.1 - 1.0 K/uL   Eosinophils Relative 2 %   Eosinophils Absolute 0.2 0.0 - 0.5 K/uL   Basophils Relative 1 %   Basophils Absolute 0.1 0.0 - 0.1 K/uL   Immature Granulocytes 1 %   Abs Immature Granulocytes 0.08 (H) 0.00 - 0.07 K/uL    Comment: Performed at [facility], [address], [place], [place] [address]  Comprehensive metabolic panel with GFR     Status:  Abnormal   Collection Time: 04/11/24 12:33 AM  Result Value Ref Range   Sodium 138 135 - 145 mmol/L   Potassium 3.8 3.5 - 5.1 mmol/L   Chloride 105 98 - 111 mmol/L   CO2 26 22 - 32 mmol/L   Glucose, Bld 109 (H) 70 - 99 mg/dL    Comment: Glucose reference range applies only to samples taken after fasting for at least 8 hours.   BUN 13 6 - 20 mg/dL   Creatinine, Ser 6.21 0.44 - 1.00 mg/dL   Calcium 8.1 (L) 8.9 - 10.3 mg/dL   Total Protein 5.5 (L) 6.5 - 8.1 g/dL   Albumin 2.7 (L) 3.5 - 5.0 g/dL   AST 16 15 - 41 U/L   ALT 13 0 - 44 U/L   Alkaline Phosphatase 68 38 - 126 U/L   Total Bilirubin 0.5 0.0 - 1.2 mg/dL   GFR, Estimated >30 >86 mL/min    Comment: (NOTE) Calculated using the CKD-EPI Creatinine Equation (2021)    Anion gap 7 5 - 15    Comment: Performed at [facility], [address], [place], [place] [address]  Urinalysis, Routine w reflex microscopic -Urine, Clean Catch     Status: Abnormal   Collection Time: 04/11/24  3:38 AM  Result Value Ref Range   Color, Urine YELLOW YELLOW   APPearance HAZY (A) CLEAR   Specific Gravity, Urine 1.025 1.005 - 1.030   pH 5.0 5.0 - 8.0   Glucose, UA NEGATIVE NEGATIVE mg/dL   Hgb urine dipstick NEGATIVE NEGATIVE   Bilirubin Urine NEGATIVE NEGATIVE   Ketones, ur NEGATIVE NEGATIVE mg/dL   Protein, ur NEGATIVE NEGATIVE mg/dL   Nitrite NEGATIVE NEGATIVE   Leukocytes,Ua NEGATIVE NEGATIVE    Comment: Performed at [facility], [address] [place], [place] [address]   *Note: Due to a large number of results and/or encounters for the requested time period, some results have not been displayed. A complete set of results can be found in Results Review.   No results found.  Pending Labs Unresulted Labs (From admission, onward)     Start     Ordered   04/11/24 0244  HIV Antibody (routine testing w rflx)  (HIV Antibody (Routine testing w reflex) panel)  Once,   R        04/11/24 0243   04/10/24 2222   CBC with Differential  Once,   STAT  04/10/24 2221            Vitals/Pain Today's Vitals   04/11/24 0319 04/11/24 0328 04/11/24 0341 04/11/24 0345  BP:   121/74   Pulse:   65   Resp:   18   Temp:      TempSrc:      SpO2: 95%  95%   Weight:      Height:      PainSc:  7   7     Isolation Precautions No active isolations  Medications Medications  HYDROmorphone (DILAUDID) injection 0.5 mg (0.5 mg Intravenous Given 04/11/24 0345)  acetaminophen (TYLENOL) tablet 650 mg (has no administration in time range)  oxyCODONE (Oxy IR/ROXICODONE) immediate release tablet 5 mg (has no administration in time range)  polyethylene glycol (MIRALAX / GLYCOLAX) packet 17 g (has no administration in time range)  prochlorperazine (COMPAZINE) injection 5 mg (has no administration in time range)  HYDROmorphone (DILAUDID) injection 1 mg (1 mg Intravenous Given 04/10/24 2300)  ondansetron (ZOFRAN) injection 4 mg (4 mg Intravenous Given 04/10/24 2304)  diphenhydrAMINE (BENADRYL) injection 25 mg (25 mg Intravenous Given 04/11/24 0052)  HYDROmorphone (DILAUDID) injection 1 mg (1 mg Intravenous Given 04/11/24 0053)  HYDROmorphone (DILAUDID) injection 2 mg (2 mg Intravenous Given 04/11/24 0227)    Mobility walks

## 2024-04-11 NOTE — Procedures (Signed)
 Ultrasound-guided diagnostic and therapeutic paracentesis performed yielding 1.5 liters of hazy, amber  fluid. No immediate complications. The fluid was sent to the lab for preordered studies. EBL none.

## 2024-04-12 ENCOUNTER — Inpatient Hospital Stay

## 2024-04-12 ENCOUNTER — Telehealth: Payer: Self-pay | Admitting: *Deleted

## 2024-04-12 ENCOUNTER — Inpatient Hospital Stay: Admitting: Internal Medicine

## 2024-04-12 DIAGNOSIS — R109 Unspecified abdominal pain: Secondary | ICD-10-CM | POA: Diagnosis not present

## 2024-04-12 LAB — HIV ANTIBODY (ROUTINE TESTING W REFLEX): HIV Screen 4th Generation wRfx: NONREACTIVE

## 2024-04-12 MED ORDER — FENTANYL 50 MCG/HR TD PT72
1.0000 | MEDICATED_PATCH | TRANSDERMAL | Status: DC
  Administered 2024-04-12: 1 via TRANSDERMAL
  Filled 2024-04-12: qty 1

## 2024-04-12 MED ORDER — POLYETHYLENE GLYCOL 3350 17 G PO PACK
17.0000 g | PACK | Freq: Two times a day (BID) | ORAL | Status: DC
  Administered 2024-04-12 – 2024-04-13 (×3): 17 g via ORAL
  Filled 2024-04-12 (×3): qty 1

## 2024-04-12 NOTE — TOC Initial Note (Signed)
 Transition of Care [facility]) - Initial/Assessment Note    Patient Details  Name: [name] MRN: [id number] Date of Birth: [birth year]  Transition of Care [facility]) CM/SW Contact:    Sonda Manuella Quill, RN Phone Number: 04/12/2024, 11:03 AM  Clinical Narrative:                 [place] w/ pt in room; pt says she shares a home w/ her dtr; she plans to return at d/c; pt identified POC Sari Mt (sister) [phone]; she verified insurance/PCP; pt will arrange transportation; she denied SDOH risks; pt says she does not have DME, HH services, or home oxygen; TOC will follow.  Expected Discharge Plan: Home/Self Care Barriers to Discharge: Continued Medical Work up   Patient Goals and CMS Choice Patient states their goals for this hospitalization and ongoing recovery are:: home CMS Medicare.gov Compare Post Acute Care list provided to:: Patient   [place] ownership interest in [facility].provided to:: Patient    Expected Discharge Plan and Services   Discharge Planning Services: CM Consult   Living arrangements for the past 2 months: Apartment                                      Prior Living Arrangements/Services Living arrangements for the past 2 months: Apartment Lives with:: Adult Children Patient language and need for interpreter reviewed:: Yes Do you feel safe going back to the place where you live?: Yes      Need for Family Participation in Patient Care: Yes (Comment) Care giver support system in place?: Yes (comment) Current home services:  (n/a) Criminal Activity/Legal Involvement Pertinent to Current Situation/Hospitalization: No - Comment as needed  Activities of Daily Living   ADL Screening (condition at time of admission) Independently performs ADLs?: Yes (appropriate for developmental age) Is the patient deaf or have difficulty hearing?: No Does the patient have difficulty seeing, even when wearing glasses/contacts?: No Does the  patient have difficulty concentrating, remembering, or making decisions?: No  Permission Sought/Granted Permission sought to share information with : Case Manager Permission granted to share information with : Yes, Verbal Permission Granted  Share Information with NAME: Case Manager     Permission granted to share info w Relationship: Sari Mt (sister) [phone]     Emotional Assessment Appearance:: Appears stated age Attitude/Demeanor/Rapport: Gracious Affect (typically observed): Accepting Orientation: : Oriented to Self, Oriented to Place, Oriented to  Time, Oriented to Situation Alcohol / Substance Use: Not Applicable Psych Involvement: No (comment)  Admission diagnosis:  Malignant ascites [R18.0] Intractable abdominal pain [R10.9] Patient Active Problem List   Diagnosis Date Noted   Intractable abdominal pain 04/11/2024   Normocytic anemia 04/11/2024   DVT (deep venous thrombosis) (HCC) 04/11/2024   Abdominal pain 03/18/2024   Carpal tunnel syndrome 02/21/2024   Displacement of cervical intervertebral disc 02/21/2024   Rupture of right rotator cuff 02/21/2024   Encounter for antineoplastic chemotherapy 10/23/2023   Hypokalemia 07/03/2023   Complaints of total body pain 01/31/2023   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   Dural sinus thrombosis 01/10/2023   Intracranial and intraspinal phlebitis and thrombophlebitis 01/10/2023   DDD (degenerative disc disease), lumbar 09/28/2022   Intertrigo 09/28/2022   Metabolic syndrome 09/28/2022   Smokers' cough (HCC) 05/17/2022   Dyslipidemia 05/17/2022   Elevated TSH 05/17/2022   Pre-diabetes 05/17/2022   Ventral hernia without obstruction or gangrene 04/26/2021   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   Malignant ascites 12/22/2019   Pleural effusion on right 12/22/2019   Tachycardia 12/22/2019   Primary peritoneal carcinomatosis (HCC) 12/16/2019   Goals of care, counseling/discussion 12/16/2019   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   Erythrocytosis 11/12/2019   Morbid obesity (HCC) 07/07/2018   BRCA1 gene mutation positive 06/18/2018   Genetic susceptibility to breast cancer 06/18/2018   Fever blister 05/17/2018   Family history of breast cancer 05/08/2018   Family history of colon cancer in mother 05/08/2018   Migraine with aura and without status migrainosus 04/18/2018   Severe recurrent major depression without psychotic features (HCC) 01/20/2016   GERD without esophagitis 01/20/2016   Osteoarthritis, multiple sites 01/20/2016   PCP:  Glenard Mire, MD Pharmacy:   [id number] - [place], [place] - [address] [place] [address] Phone: [phone] Fax: [phone]  [facility] - [place], [place] - [address] [place] [address] Phone: [phone] Fax: [phone]  [id number] - [place], [place] - [address] [place] [address] Phone: [phone] Fax: [phone]  [id number] - [place], [place] - [address] AT CORNER OF [facility] [address] [place] [address] Phone: [phone] Fax: [phone]     Social Drivers of Health (SDOH) Social History: SDOH Screenings   Food Insecurity: No Food Insecurity (04/12/2024)  Housing: Low Risk  (04/12/2024)  Transportation Needs: No Transportation Needs (04/12/2024)  Utilities: Not At Risk (04/12/2024)  Alcohol Screen: Low Risk  (08/07/2023)  Depression (PHQ2-9): High Risk (08/09/2023)  Financial Resource Strain: Medium Risk (10/24/2023)   Received from [facility]  Physical Activity: Insufficiently Active (10/24/2023)   Received from [facility]  Social Connections: Somewhat Isolated (10/24/2023)   Received from [facility]  Stress: Stress Concern Present (10/24/2023)   Received from [facility]  Tobacco Use:  High Risk (04/10/2024)   SDOH Interventions: Food Insecurity Interventions: Intervention Not Indicated, Inpatient TOC Housing Interventions: Intervention Not Indicated, Inpatient TOC Transportation Interventions: Intervention Not Indicated, Inpatient TOC Utilities Interventions: Intervention Not Indicated, Inpatient TOC   Readmission Risk Interventions    04/12/2024   11:01 AM  Readmission Risk Prevention Plan  Transportation Screening Complete  Medication Review (RN Care Manager) Complete  PCP or Specialist appointment within 3-5 days of discharge Complete  HRI or Home Care Consult Complete  SW Recovery Care/Counseling Consult Complete  Palliative Care Screening Not Applicable  Skilled Nursing Facility Not Applicable

## 2024-04-12 NOTE — Telephone Encounter (Signed)
 I called 10 min. After she sent the message . She is in [place] because she says she is having pain stomach and she got the fluid off of her. She wants to get her chemo and she  I told her that I am not sure about this get tx at  [facility] long.  I spoke to Dr. Valentine Gasmen and he is trying to get with the MD there and see what they can do. I told the patient that when MD comes in to say that she would like to get her chemo while I am here  because it was suppose be done here at [facility] today and she had to go to the hospital, she understands.

## 2024-04-12 NOTE — Progress Notes (Signed)
 Progress Note    [name]  VHQ:[id number] DOB: 25-Sep-1971  DOA: 04/10/2024 PCP: Sowles, Krichna, MD    Brief Narrative:     Medical records reviewed and are as summarized below:  [name] is an 53 y.o. female with medical history significant of BRCA1 positive, right upper lobe bronchus cancer, DVT, depression, drug-induced alopecia, dyslipidemia, unspecified dysrhythmia, GERD, hypertension, insomnia, menorrhagia, migraine headaches, depression, osteoarthritis, ovarian cancer on chemotherapy who has presented with abdominal pain due to malignant ascites.  She had a therapeutic paracentesis 6 days ago with 2.6 L of fluid taken.  However, the patient stated that over the last few days she progressively started having more abdominal distention became very painful so she had to come to the emergency department.   Assessment/Plan:   Principal Problem:   Intractable abdominal pain Active Problems:   GERD without esophagitis   Malignant ascites   Dyslipidemia   Pre-diabetes   Carcinomatosis peritonei (HCC)   Serous adenocarcinoma (HCC)   Normocytic anemia   DVT (deep venous thrombosis) (HCC)   Intractable abdominal pain Secondary to:   Malignant ascites Due to:   Carcinomatosis peritonei (HCC) In the setting of:   Ovarian serous adenocarcinoma -tolerating diet -bowel regimen -patient knows to follow up at [facility] next week -titrate pain meds-- has not had fentanyl  patch for the last week ambulate      GERD without esophagitis Continue PPI.     Dyslipidemia Continue rosuvastatin  5 mg p.o. daily.     Normocytic anemia Monitor hematocrit hemoglobin. Transfuse as needed.     DVT (deep venous thrombosis) (HCC)  Continue Xarelto  20 mg p.o. daily.  obesity Body mass index is 38.26 kg/m.   Family Communication/Anticipated D/C date and plan/Code Status   DVT prophylaxis: Lovenox  ordered. Code Status: Full Code.   Disposition Plan: Status is:  Inpatient      Medical Consultants:      Objective:    Vitals:   04/11/24 2157 04/11/24 2212 04/12/24 0540 04/12/24 1323  BP: 100/64  100/60 112/65  Pulse: 66  63 73  Resp:   16 18  Temp:   98 F (36.7 C) 98.2 F (36.8 C)  TempSrc:   Oral Oral  SpO2: 90% 92% 97% 91%  Weight:      Height:        Intake/Output Summary (Last 24 hours) at 04/12/2024 1536 Last data filed at 04/11/2024 2150 Gross per 24 hour  Intake 840 ml  Output --  Net 840 ml   Filed Weights   04/10/24 2202  Weight: 98 kg    Exam:  General: Appearance:    Obese female in no acute distress     Lungs:     respirations unlabored  Heart:    Normal heart rate.   MS:   All extremities are intact.   Neurologic:   Awake, alert     Data Reviewed:   I have personally reviewed following labs and imaging studies:  Labs: Labs show the following:   Basic Metabolic Panel: Recent Labs  Lab 04/11/24 0033  NA 138  K 3.8  CL 105  CO2 26  GLUCOSE 109*  BUN 13  CREATININE 0.69  CALCIUM  8.1*   GFR Estimated Creatinine Clearance: 91.7 mL/min (by C-G formula based on SCr of 0.69 mg/dL). Liver Function Tests: Recent Labs  Lab 04/11/24 0033  AST 16  ALT 13  ALKPHOS 68  BILITOT 0.5  PROT 5.5*  ALBUMIN  2.7*  No results for input(s): "LIPASE", "AMYLASE" in the last 168 hours. No results for input(s): "AMMONIA" in the last 168 hours. Coagulation profile No results for input(s): "INR", "PROTIME" in the last 168 hours.  CBC: Recent Labs  Lab 04/11/24 0033  WBC 7.1  NEUTROABS 4.8  HGB 10.6*  HCT 34.0*  MCV 98.8  PLT 166   Cardiac Enzymes: No results for input(s): "CKTOTAL", "CKMB", "CKMBINDEX", "TROPONINI" in the last 168 hours. BNP (last 3 results) No results for input(s): "PROBNP" in the last 8760 hours. CBG: No results for input(s): "GLUCAP" in the last 168 hours. D-Dimer: No results for input(s): "DDIMER" in the last 72 hours. Hgb A1c: No results for input(s): "HGBA1C" in the  last 72 hours. Lipid Profile: No results for input(s): "CHOL", "HDL", "LDLCALC", "TRIG", "CHOLHDL", "LDLDIRECT" in the last 72 hours. Thyroid  function studies: No results for input(s): "TSH", "T4TOTAL", "T3FREE", "THYROIDAB" in the last 72 hours.  Invalid input(s): "FREET3" Anemia work up: No results for input(s): "VITAMINB12", "FOLATE", "FERRITIN", "TIBC", "IRON", "RETICCTPCT" in the last 72 hours. Sepsis Labs: Recent Labs  Lab 04/11/24 0033  WBC 7.1    Microbiology No results found for this or any previous visit (from the past 240 hours).  Procedures and diagnostic studies:  US  Paracentesis Result Date: 04/11/2024 INDICATION: Patient with history of stage IV ovarian cancer, peritoneal carcinomatosis, recurrent malignant ascites. Request received for diagnostic and therapeutic paracentesis. EXAM: ULTRASOUND GUIDED DIAGNOSTIC AND THERAPEUTIC PARACENTESIS MEDICATIONS: 8 ml 1% lidocaine  COMPLICATIONS: None immediate. PROCEDURE: Informed written consent was obtained from the patient after a discussion of the risks, benefits and alternatives to treatment. A timeout was performed prior to the initiation of the procedure. Initial ultrasound scanning demonstrates a small amount of ascites within the left lower abdominal quadrant. The left lower abdomen was prepped and draped in the usual sterile fashion. 1% lidocaine  was used for local anesthesia. Following this, a 19 gauge, 10-cm, Yueh catheter was introduced. An ultrasound image was saved for documentation purposes. The paracentesis was performed. The catheter was removed and a dressing was applied. The patient tolerated the procedure well without immediate post procedural complication. FINDINGS: A total of approximately 1.5 liters of hazy, amber fluid was removed. Samples were sent to the laboratory as requested by the clinical team. IMPRESSION: Successful ultrasound-guided diagnostic and therapeutic paracentesis yielding 1.5 liters of peritoneal  fluid. Performed by: Wash Hack Electronically Signed   By: Nicoletta Barrier M.D.   On: 04/11/2024 17:29    Medications:    buPROPion   300 mg Oral Daily   cariprazine   1.5 mg Oral Daily   Chlorhexidine  Gluconate Cloth  6 each Topical Daily   cloNIDine   0.1 mg Oral QHS   DULoxetine   60 mg Oral Daily   fentaNYL   1 patch Transdermal Q72H   pantoprazole   40 mg Oral Daily   polyethylene glycol  17 g Oral BID   rivaroxaban   20 mg Oral Q supper   rosuvastatin   5 mg Oral Daily   sodium chloride  flush  10-40 mL Intracatheter Q12H   Tenapanor  HCl  1 tablet Oral Q1200   Continuous Infusions:   LOS: 1 day   Enrigue Harvard  [facility]   How to contact the [facility] Attending or Consulting provider 7A - 7P or covering provider during after hours 7P -7A, for this patient?  Check the care team in [facility] and look for a) attending/consulting [facility] provider listed and b) the [facility] team listed Log into [email or web address] and use [place]'s  universal password to access. If you do not have the password, please contact the hospital operator. Locate the [facility] provider you are looking for under [facility] and page to a number that you can be directly reached. If you still have difficulty reaching the provider, please page the [facility] (Director on Call) for the Hospitalists listed on amion for assistance.  04/12/2024, 3:36 PM

## 2024-04-12 NOTE — Plan of Care (Signed)
 Problem: Education: Goal: Knowledge of General Education information will improve Description: Including pain rating scale, medication(s)/side effects and non-pharmacologic comfort measures Outcome: Progressing   Problem: Clinical Measurements: Goal: Ability to maintain clinical measurements within normal limits will improve Outcome: Progressing   Problem: Activity: Goal: Risk for activity intolerance will decrease Outcome: Progressing   Problem: Coping: Goal: Level of anxiety will decrease Outcome: Progressing   Problem: Pain Managment: Goal: General experience of comfort will improve and/or be controlled Outcome: Progressing   Problem: Safety: Goal: Ability to remain free from injury will improve Outcome: Progressing   Ara Knee, RN 04/12/24 5:08 PM

## 2024-04-13 DIAGNOSIS — R109 Unspecified abdominal pain: Secondary | ICD-10-CM | POA: Diagnosis not present

## 2024-04-13 MED ORDER — HEPARIN SOD (PORK) LOCK FLUSH 100 UNIT/ML IV SOLN
500.0000 [IU] | Freq: Once | INTRAVENOUS | Status: AC
  Administered 2024-04-13: 500 [IU] via INTRAVENOUS
  Filled 2024-04-13: qty 5

## 2024-04-13 NOTE — Plan of Care (Signed)
 Patient discharging home via private vehicle. AVS and discharge instruction provided, including Fentanyl  patches for pain management and Miralax  for bowel management. Patient verbalizes understanding. Patient to discharge following IV team de-accessing her port. Ara Knee, RN 04/13/24 11:47 AM

## 2024-04-13 NOTE — Discharge Summary (Signed)
 Physician Discharge Summary  [name] ZOX:[id number] DOB: 1971-05-31 DOA: 04/10/2024  PCP: Sowles, Krichna, MD  Admit date: 04/10/2024 Discharge date: 04/13/2024  Admitted From: home Discharge disposition: home   Recommendations for Outpatient Follow-Up:  Close oncology follow up- may need set up for regular outpatient paracentesis   Discharge Diagnosis:   Principal Problem:   Intractable abdominal pain Active Problems:   GERD without esophagitis   Malignant ascites   Dyslipidemia   Pre-diabetes   Carcinomatosis peritonei (HCC)   Serous adenocarcinoma (HCC)   Normocytic anemia   DVT (deep venous thrombosis) (HCC)    Discharge Condition: Improved.  Diet recommendation: small/frequent meals  Wound care: None.  Code status: Full.   History of Present Illness:    [name] is a 53 y.o. female with medical history significant of BRCA1 positive, right upper lobe bronchus cancer, DVT, depression, drug-induced alopecia, dyslipidemia, unspecified dysrhythmia, GERD, hypertension, insomnia, menorrhagia, migraine headaches, depression, osteoarthritis, ovarian cancer on chemotherapy who has presented with abdominal pain due to malignant ascites.  She had a therapeutic paracentesis 6 days ago with 2.6 L of fluid taken.  However, the patient stated that over the last few days she progressively started having more abdominal distention became very painful so she had to come to the emergency department.  She has been dyspneic, but no chest pain, palpitations, diaphoresis.  Also endorses nausea and several episodes of emesis.  No melena or hematochezia.  No flank pain, dysuria, frequency or hematuria.  No fever, sore throat, rhinorrhea, cough or wheezing.    Hospital Course by Problem:   Intractable abdominal pain Secondary to:   Malignant ascites Due to:   Carcinomatosis peritonei (HCC) In the setting of:   Ovarian serous adenocarcinoma -tolerating  diet -aggressive bowel regimen -patient knows to [facility] next week as for now she prefers to stay with [facility] -titrate pain meds-- has not had fentanyl  patch for the last week -pain controlled on day of d/c, eating and walking       GERD without esophagitis Continue PPI.     Dyslipidemia Continue rosuvastatin  5 mg p.o. daily.     Normocytic anemia -h/h stable     DVT (deep venous thrombosis) (HCC)  Continue Xarelto  20 mg p.o. daily.   obesity Body mass index is 38.26 kg/m.    Medical Consultants:      Discharge Exam:   Vitals:   04/12/24 2131 04/13/24 0555  BP: 119/76 96/62  Pulse: 69 67  Resp: 15 15  Temp: 98.2 F (36.8 C) 98.2 F (36.8 C)  SpO2: 92% 92%   Vitals:   04/12/24 0540 04/12/24 1323 04/12/24 2131 04/13/24 0555  BP: 100/60 112/65 119/76 96/62  Pulse: 63 73 69 67  Resp: 16 18 15 15   Temp: 98 F (36.7 C) 98.2 F (36.8 C) 98.2 F (36.8 C) 98.2 F (36.8 C)  TempSrc: Oral Oral Oral Oral  SpO2: 97% 91% 92% 92%  Weight:      Height:        General exam: Appears calm and comfortable.   The results of significant diagnostics from this hospitalization (including imaging, microbiology, ancillary and laboratory) are listed below for reference.     Procedures and Diagnostic Studies:   US  Paracentesis Result Date: 04/11/2024 INDICATION: Patient with history of stage IV ovarian cancer, peritoneal carcinomatosis, recurrent malignant ascites. Request received for diagnostic and therapeutic paracentesis. EXAM: ULTRASOUND GUIDED DIAGNOSTIC AND THERAPEUTIC PARACENTESIS MEDICATIONS: 8  ml 1% lidocaine  COMPLICATIONS: None immediate. PROCEDURE: Informed written consent was obtained from the patient after a discussion of the risks, benefits and alternatives to treatment. A timeout was performed prior to the initiation of the procedure. Initial ultrasound scanning demonstrates a small amount of ascites within the left lower abdominal quadrant. The  left lower abdomen was prepped and draped in the usual sterile fashion. 1% lidocaine  was used for local anesthesia. Following this, a 19 gauge, 10-cm, Yueh catheter was introduced. An ultrasound image was saved for documentation purposes. The paracentesis was performed. The catheter was removed and a dressing was applied. The patient tolerated the procedure well without immediate post procedural complication. FINDINGS: A total of approximately 1.5 liters of hazy, amber fluid was removed. Samples were sent to the laboratory as requested by the clinical team. IMPRESSION: Successful ultrasound-guided diagnostic and therapeutic paracentesis yielding 1.5 liters of peritoneal fluid. Performed by: Wash Hack Electronically Signed   By: Nicoletta Barrier M.D.   On: 04/11/2024 17:29     Labs:   Basic Metabolic Panel: Recent Labs  Lab 04/11/24 0033  NA 138  K 3.8  CL 105  CO2 26  GLUCOSE 109*  BUN 13  CREATININE 0.69  CALCIUM  8.1*   GFR Estimated Creatinine Clearance: 91.7 mL/min (by C-G formula based on SCr of 0.69 mg/dL). Liver Function Tests: Recent Labs  Lab 04/11/24 0033  AST 16  ALT 13  ALKPHOS 68  BILITOT 0.5  PROT 5.5*  ALBUMIN  2.7*   No results for input(s): "LIPASE", "AMYLASE" in the last 168 hours. No results for input(s): "AMMONIA" in the last 168 hours. Coagulation profile No results for input(s): "INR", "PROTIME" in the last 168 hours.  CBC: Recent Labs  Lab 04/11/24 0033  WBC 7.1  NEUTROABS 4.8  HGB 10.6*  HCT 34.0*  MCV 98.8  PLT 166   Cardiac Enzymes: No results for input(s): "CKTOTAL", "CKMB", "CKMBINDEX", "TROPONINI" in the last 168 hours. BNP: Invalid input(s): "POCBNP" CBG: No results for input(s): "GLUCAP" in the last 168 hours. D-Dimer No results for input(s): "DDIMER" in the last 72 hours. Hgb A1c No results for input(s): "HGBA1C" in the last 72 hours. Lipid Profile No results for input(s): "CHOL", "HDL", "LDLCALC", "TRIG", "CHOLHDL",  "LDLDIRECT" in the last 72 hours. Thyroid  function studies No results for input(s): "TSH", "T4TOTAL", "T3FREE", "THYROIDAB" in the last 72 hours.  Invalid input(s): "FREET3" Anemia work up No results for input(s): "VITAMINB12", "FOLATE", "FERRITIN", "TIBC", "IRON", "RETICCTPCT" in the last 72 hours. Microbiology No results found for this or any previous visit (from the past 240 hours).   Discharge Instructions:   Discharge Instructions     Discharge instructions   Complete by: As directed    Aggressive bowel regimen for constipation Close follow up with oncology Small frequent meals   Increase activity slowly   Complete by: As directed       Allergies as of 04/13/2024       Reactions   Hydroxyzine  Hcl Other (See Comments)   Makes her loopy/jittery        Medication List     STOP taking these medications    predniSONE  10 MG tablet Commonly known as: DELTASONE    Udenyca  6 MG/0.6ML Soaj Generic drug: Pegfilgrastim -cbqv       TAKE these medications    buPROPion  300 MG 24 hr tablet Commonly known as: Wellbutrin  XL Take 1 tablet (300 mg total) by mouth daily.   cariprazine  1.5 MG capsule Commonly known as: Vraylar  Take 1  capsule (1.5 mg total) by mouth daily.   cloNIDine  0.1 MG tablet Commonly known as: CATAPRES  Take 1 tablet (0.1 mg total) by mouth at bedtime.   cloNIDine  0.3 mg/24hr patch Commonly known as: CATAPRES  - Dosed in mg/24 hr Place 1 patch (0.3 mg total) onto the skin once a week. Friday   diphenhydrAMINE  25 mg capsule Commonly known as: BENADRYL  Take 25 mg by mouth every 6 (six) hours as needed for allergies or itching.   docusate sodium  100 MG capsule Commonly known as: COLACE Take 100 mg by mouth daily as needed for moderate constipation or mild constipation.   DULoxetine  60 MG capsule Commonly known as: CYMBALTA  TAKE 1 CAPSULE BY MOUTH DAILY   fentaNYL  50 MCG/HR Commonly known as: DURAGESIC  Place 1 patch onto the skin every 3  (three) days.   HYDROmorphone  2 MG tablet Commonly known as: Dilaudid  Take 1-2 tablets (2-4 mg total) by mouth every 4 (four) hours as needed for severe pain (pain score 7-10).   Ibsrela 50 MG Tabs Generic drug: Tenapanor  HCl Take 1 tablet by mouth daily at 12 noon.   lactobacillus acidophilus Tabs tablet Take 2 tablets by mouth daily at 12 noon.   LORazepam  0.5 MG tablet Commonly known as: ATIVAN  Take 1 tablet (0.5 mg total) by mouth every 12 (twelve) hours as needed for anxiety.   multivitamin with minerals Tabs tablet Take 1 tablet by mouth daily.   naloxone  4 MG/0.1ML Liqd nasal spray kit Commonly known as: NARCAN  SPRAY 1 SPRAY INTO ONE NOSTRIL AS DIRECTED FOR OPIOID OVERDOSE (TURN PERSON ON SIDE AFTER DOSE. IF NO RESPONSE IN 2-3 MINUTES OR PERSON RESPONDS BUT RELAPSES, REPEAT USING A NEW SPRAY DEVICE AND SPRAY INTO THE OTHER NOSTRIL. CALL 911 AFTER USE.) * EMERGENCY USE ONLY *   nystatin  powder Commonly known as: MYCOSTATIN /NYSTOP  Apply 1 Application topically 3 (three) times daily.   omeprazole  20 MG capsule Commonly known as: PRILOSEC TAKE 1 CAPSULE BY MOUTH DAILY   ondansetron  8 MG disintegrating tablet Commonly known as: ZOFRAN -ODT Take 1 tablet (8 mg total) by mouth every 8 (eight) hours as needed for nausea or vomiting.   polyethylene glycol powder 17 GM/SCOOP powder Commonly known as: GLYCOLAX /MIRALAX  Take 0.5 Containers by mouth daily as needed for mild constipation or moderate constipation.   potassium chloride  20 MEQ/15ML (10%) Soln Take 15 mLs (20 mEq total) by mouth 2 (two) times daily.   pregabalin  150 MG capsule Commonly known as: LYRICA  Take 150 mg by mouth 3 (three) times daily as needed (Pain).   prochlorperazine  10 MG tablet Commonly known as: COMPAZINE  Take 1 tablet (10 mg total) by mouth every 6 (six) hours as needed for nausea or vomiting.   rivaroxaban  20 MG Tabs tablet Commonly known as: XARELTO  Take 1 tablet (20 mg total) by mouth  daily with supper.   rosuvastatin  5 MG tablet Commonly known as: CRESTOR  TAKE 1 TABLET BY MOUTH ONCE  DAILY (IN PLACE OF ATORVASTATIN ) What changed:  how much to take how to take this when to take this additional instructions   Slow Magnesium /Calcium  70-117 MG Tbec Generic drug: Magnesium  Cl-Calcium  Carbonate Take 1 tablet by mouth 2 (two) times daily.   solifenacin  10 MG tablet Commonly known as: VESICARE  Take 10 mg by mouth daily.   Spiriva  Respimat 2.5 MCG/ACT Aers Generic drug: Tiotropium Bromide  Monohydrate Inhale 2 puffs into the lungs daily. What changed:  when to take this reasons to take this   SUMAtriptan  100 MG tablet Commonly known  as: Imitrex  Take 1 tablet (100 mg total) by mouth every 2 (two) hours as needed for migraine. May repeat in 2 hours if headache persists or recurs.   trazodone  300 MG tablet Commonly known as: DESYREL  Take 1 tablet (300 mg total) by mouth at bedtime as needed for sleep.          Time coordinating discharge: 45 min  Signed:  Enrigue Harvard DO  [facility] 04/13/2024, 11:11 AM

## 2024-04-15 NOTE — Progress Notes (Signed)
 [facility] Discharge Plan  Member has been discharged from [facility]. Please see below Discharge Plan for details. The Member has been provided a copy of their Discharge Plan via the [facility] Care portal. If you have any questions, please contact our Clinical Team at [phone].  Name: [name], [name] Date of Birth: 1971-05-30 Discharge Date: 2024-04-15 Discharged Reason: Not Engaged Discharging to: Referring Specialist Referring Provider: Arthuro Billow, MD  Current Psychiatric Medications Vraylar  1.5mg  Trazodone  300mg  Bupropion  XL 300mg  Cymbalta  60mg  Lyrica  150mg  Clonidine  pills & patch *Note, list may not be fully up-to-date. Last reviewed with patient on 12/26/2023 intake.  *Patient reported her psychiatric medications are prescribed and managed by her PCP.  Psychiatric Medication Recommendation Transition N/A (no CC med recs made)  Referrals: N/A  Discharge Summary Member attended [facility] intake on 12/19/2023 and attended [facility] Call 01/05/2024. Member engaged a bit via email to coordinate scheduling/rescheduling before becoming unresponsive to outreach attempts and disengaging fully. BHCM shared psychoeducation on sleep and sleep hygiene at [facility] Call, and introduced DBT as intervention for sleep support and anxiety/mindfulness during the day. Member learned specific DBT skills for sleep and anxiety at the visit. She additionally expressed interest in adjusting her medications prior to disengaging.  Member is welcome at [facility] in the future should she wish to engage with visits and need additional support.    Final Intake Summary [name] a.k.a. "[name]" is a 53 y/o white female with stage IV primary peritoneal adenocarcinoma first dx'd in 2020, referred by Dr. Valentine [name] @ [facility]. [name] has dx of recurrent mild MDD in chart at time of CC referral. She is open to psychiatric medication recommendations.  Assessments:  [name] scored PHQ-9=21 (severe), reporting  anhedonia, low mood/energy/appetite, trouble falling & staying asleep, feeling bad about herself, trouble concentrating, psychomotor retardation, and passive suicidal ideation once every 1-2 month(s). Safety Concerns: [name] scored CSSR-S=Low-Risk, reporting a history of passive SI w/o specificity, method, plan, or intention. No prior attempts reported. [name] scored GAD-7=15 (severe), reporting every symptom of anxiety listed on the assessment. Primary areas of worry include her health, finances, and family.  Psych Tx/Hx: [name] takes Vraylar  1.5mg , Trazodone  300mg , Bupropion  XL 300mg , Cymbalta  60mg , Lyrica  150mg  for back pain, and Clonidine  pill & patch for night sweats/hot flashes. She recently tried Ambien  for sleep w/o benefit and has taken Ativan  in the past. Her psych meds are prescribed by PCP.   Psychosocial Hx: [name] is single and on disability, living at home with her youngest daughter who is pregnant and not working. [name] has 4 children total. She was adopted, which she feels has shaped some of her mental health challenges related to identity and self-worth.  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

## 2024-04-16 ENCOUNTER — Telehealth: Payer: Self-pay

## 2024-04-16 ENCOUNTER — Inpatient Hospital Stay: Admitting: Hospice and Palliative Medicine

## 2024-04-16 ENCOUNTER — Encounter: Payer: Self-pay | Admitting: Internal Medicine

## 2024-04-16 ENCOUNTER — Telehealth: Payer: Self-pay | Admitting: Hospice and Palliative Medicine

## 2024-04-16 ENCOUNTER — Other Ambulatory Visit: Payer: Self-pay

## 2024-04-16 ENCOUNTER — Ambulatory Visit

## 2024-04-16 DIAGNOSIS — C482 Malignant neoplasm of peritoneum, unspecified: Secondary | ICD-10-CM

## 2024-04-16 DIAGNOSIS — Z515 Encounter for palliative care: Secondary | ICD-10-CM

## 2024-04-16 DIAGNOSIS — R180 Malignant ascites: Secondary | ICD-10-CM | POA: Diagnosis not present

## 2024-04-16 DIAGNOSIS — G893 Neoplasm related pain (acute) (chronic): Secondary | ICD-10-CM

## 2024-04-16 DIAGNOSIS — K5903 Drug induced constipation: Secondary | ICD-10-CM

## 2024-04-16 MED ORDER — FENTANYL 75 MCG/HR TD PT72: 1.0000 | MEDICATED_PATCH | TRANSDERMAL | 0 refills | Status: DC

## 2024-04-16 NOTE — Telephone Encounter (Signed)
 [facility] in [place] do not have Fentanyl  75 mcg in stock.  Patient has called and [facility] in [place] has in stock.

## 2024-04-16 NOTE — Telephone Encounter (Signed)
 Telephone visit scheduled for today with Lilian Register, NP.

## 2024-04-16 NOTE — Progress Notes (Signed)
 Virtual Visit via Telephone Note  I connected with [name] on 04/16/24 at  1:00 PM EDT by telephone and verified that I am speaking with the correct person using two identifiers.  Location: Patient: Home Provider: Clinic   I discussed the limitations, risks, security and privacy concerns of performing an evaluation and management service by telephone and the availability of in person appointments. I also discussed with the patient that there may be a patient responsible charge related to this service. The patient expressed understanding and agreed to proceed.   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  Patient has also had chronic abdominal pain.  She was referred to palliative care to help address goals and manage ongoing symptoms.   Observations/Objective: Follow-up telephone visit to address pain management.  Patient recently hospitalized with intractable pain.  Discharged home on fentanyl /hydromorphone .  Patient reports that pain is primarily right lower pelvis.  She does not feel that the current dose of fentanyl  is effective.  She does have some relief with utilization of hydromorphone .  She endorses constipation but does not feel that that is contributing to her pain.  She is taking senna daily.  Patient with recent paracentesis and feels that ascites has reaccumulated.  However, she does not feel that this is contributing to pain and more so pressure.  Patient has not found significant relief with pain with previous  paracentesis.  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  Patient sees Dr. Valentine Gasmen later this week   Neoplasm related pain-increase transdermal fentanyl  to 75 mcg every 72 hours.  Continue oral hydromorphone  as needed for breakthrough pain.  Will speak with IR about feasibility of nerve block (hypogastric plexus?).  Ascites -repeat paracentesis  Opioid-induced constipation -liberalize daily senna and add twice daily MiraLAX   Case and plan discussed with Dr. Valentine Gasmen  Follow Up Instructions: Follow-up 1-2 weeks   I discussed the assessment and treatment plan with the patient. The patient was provided an opportunity to ask questions and all were answered. The patient agreed with the plan and demonstrated an understanding of the instructions.   The patient was advised to call back or seek an in-person evaluation if the symptoms worsen or if the condition fails to improve as anticipated.  I provided 20 minutes of non-face-to-face time during this encounter.   Peggyann Bower, NP

## 2024-04-16 NOTE — Telephone Encounter (Signed)
 Josh Borders, NP would like patient to be scheduled for paracentesis

## 2024-04-16 NOTE — Telephone Encounter (Signed)
 Pt called and asked to speak with Josh B. I told her he is in clinic and I can transfer her to triage. She said no just send a message to [place].  Pt stated she is in a lot of pain and wants Josh to adjust her pain meds.   Pt would like to be called.

## 2024-04-16 NOTE — Telephone Encounter (Signed)
 Patient woke up this morning with abdomen/pelvic pain 15/10 this morning.  Had pain when she went to bed but wasn't this bad.  She does have her pain patch on and taking Hydromorphone  but pain is not relieved.  Recent d/c on 4/19 from admission at [facility] for pain.  During admission paracentesis was performed on 04/11/24.  She feels like the fluid has built back up and the paracentesis helps relieve the pressure but not the pain.

## 2024-04-16 NOTE — Telephone Encounter (Signed)
 Patient scheduled for paracentesis on 04/18/24 @ 230 (after visit at [facility])

## 2024-04-17 ENCOUNTER — Encounter: Payer: Self-pay | Admitting: Internal Medicine

## 2024-04-18 ENCOUNTER — Inpatient Hospital Stay

## 2024-04-18 ENCOUNTER — Other Ambulatory Visit

## 2024-04-18 ENCOUNTER — Encounter: Payer: Self-pay | Admitting: Internal Medicine

## 2024-04-18 ENCOUNTER — Ambulatory Visit
Admission: RE | Admit: 2024-04-18 | Discharge: 2024-04-18 | Disposition: A | Discharge status: Home / Self Care | Source: Ambulatory Visit | Attending: Hospice and Palliative Medicine | Admitting: Hospice and Palliative Medicine

## 2024-04-18 ENCOUNTER — Inpatient Hospital Stay: Admitting: Internal Medicine

## 2024-04-18 ENCOUNTER — Ambulatory Visit: Admitting: Internal Medicine

## 2024-04-18 ENCOUNTER — Other Ambulatory Visit: Payer: Self-pay | Admitting: Internal Medicine

## 2024-04-18 ENCOUNTER — Ambulatory Visit

## 2024-04-18 DIAGNOSIS — R180 Malignant ascites: Secondary | ICD-10-CM | POA: Diagnosis present

## 2024-04-18 DIAGNOSIS — C482 Malignant neoplasm of peritoneum, unspecified: Secondary | ICD-10-CM | POA: Diagnosis not present

## 2024-04-18 MED ORDER — LIDOCAINE HCL (PF) 1 % IJ SOLN
10.0000 mL | Freq: Once | INTRAMUSCULAR | Status: AC
  Administered 2024-04-18: 10 mL via INTRADERMAL
  Filled 2024-04-18: qty 10

## 2024-04-18 NOTE — Procedures (Signed)
 PROCEDURE SUMMARY:  Successful image-guided paracentesis from the left lower abdomen.  Yielded 3.7 liters of yellow fluid.  No immediate complications.  EBL: zero Patient tolerated well.   Specimen not sent for labs.  Please see imaging section of Epic for full dictation.  Kendrell Lottman NP 04/18/2024 4:35 PM

## 2024-04-19 ENCOUNTER — Other Ambulatory Visit: Payer: Self-pay | Admitting: Hospice and Palliative Medicine

## 2024-04-19 ENCOUNTER — Inpatient Hospital Stay

## 2024-04-19 ENCOUNTER — Inpatient Hospital Stay: Admitting: Internal Medicine

## 2024-04-19 ENCOUNTER — Other Ambulatory Visit: Payer: Self-pay | Admitting: *Deleted

## 2024-04-19 ENCOUNTER — Encounter: Payer: Self-pay | Admitting: Internal Medicine

## 2024-04-19 VITALS — BP 92/76 | HR 102 | Temp 97.2°F | Resp 16 | Wt 204.0 lb

## 2024-04-19 VITALS — BP 92/64 | HR 67 | Resp 16

## 2024-04-19 DIAGNOSIS — Z7189 Other specified counseling: Secondary | ICD-10-CM

## 2024-04-19 DIAGNOSIS — Z79899 Other long term (current) drug therapy: Secondary | ICD-10-CM | POA: Diagnosis not present

## 2024-04-19 DIAGNOSIS — C482 Malignant neoplasm of peritoneum, unspecified: Secondary | ICD-10-CM

## 2024-04-19 DIAGNOSIS — Z5111 Encounter for antineoplastic chemotherapy: Secondary | ICD-10-CM | POA: Diagnosis not present

## 2024-04-19 LAB — PATHOLOGIST SMEAR REVIEW

## 2024-04-19 LAB — CBC WITH DIFFERENTIAL (CANCER CENTER ONLY)
Abs Immature Granulocytes: 0.07 10*3/uL (ref 0.00–0.07)
Basophils Absolute: 0.1 10*3/uL (ref 0.0–0.1)
Basophils Relative: 1 %
Eosinophils Absolute: 0.1 10*3/uL (ref 0.0–0.5)
Eosinophils Relative: 1 %
HCT: 38.6 % (ref 36.0–46.0)
Hemoglobin: 12.2 g/dL (ref 12.0–15.0)
Immature Granulocytes: 1 %
Lymphocytes Relative: 27 %
Lymphs Abs: 1.5 10*3/uL (ref 0.7–4.0)
MCH: 30.4 pg (ref 26.0–34.0)
MCHC: 31.6 g/dL (ref 30.0–36.0)
MCV: 96.3 fL (ref 80.0–100.0)
Monocytes Absolute: 0.6 10*3/uL (ref 0.1–1.0)
Monocytes Relative: 12 %
Neutro Abs: 3.1 10*3/uL (ref 1.7–7.7)
Neutrophils Relative %: 58 %
Platelet Count: 270 10*3/uL (ref 150–400)
RBC: 4.01 MIL/uL (ref 3.87–5.11)
RDW: 14.5 % (ref 11.5–15.5)
WBC Count: 5.4 10*3/uL (ref 4.0–10.5)
nRBC: 0 % (ref 0.0–0.2)

## 2024-04-19 LAB — CMP (CANCER CENTER ONLY)
ALT: 9 U/L (ref 0–44)
AST: 18 U/L (ref 15–41)
Albumin: 2.6 g/dL — ABNORMAL LOW (ref 3.5–5.0)
Alkaline Phosphatase: 90 U/L (ref 38–126)
Anion gap: 10 (ref 5–15)
BUN: 8 mg/dL (ref 6–20)
CO2: 25 mmol/L (ref 22–32)
Calcium: 8.4 mg/dL — ABNORMAL LOW (ref 8.9–10.3)
Chloride: 98 mmol/L (ref 98–111)
Creatinine: 0.78 mg/dL (ref 0.44–1.00)
GFR, Estimated: >60 mL/min (ref >60)
Glucose, Bld: 157 mg/dL — ABNORMAL HIGH (ref 70–99)
Potassium: 3.2 mmol/L — ABNORMAL LOW (ref 3.5–5.1)
Sodium: 133 mmol/L — ABNORMAL LOW (ref 135–145)
Total Bilirubin: 0.4 mg/dL (ref 0.0–1.2)
Total Protein: 6.1 g/dL — ABNORMAL LOW (ref 6.5–8.1)

## 2024-04-19 LAB — PROTEIN, URINE, RANDOM: Total Protein, Urine: 81 mg/dL

## 2024-04-19 MED ORDER — HEPARIN SOD (PORK) LOCK FLUSH 100 UNIT/ML IV SOLN
500.0000 [IU] | Freq: Once | INTRAVENOUS | Status: AC | PRN
  Administered 2024-04-19: 500 [IU]
  Filled 2024-04-19: qty 5

## 2024-04-19 MED ORDER — FAMOTIDINE IN NACL 20-0.9 MG/50ML-% IV SOLN
20.0000 mg | Freq: Once | INTRAVENOUS | Status: AC
  Administered 2024-04-19: 20 mg via INTRAVENOUS
  Filled 2024-04-19: qty 50

## 2024-04-19 MED ORDER — SODIUM CHLORIDE 0.9 % IV SOLN
INTRAVENOUS | Status: DC
  Filled 2024-04-19: qty 250

## 2024-04-19 MED ORDER — DEXAMETHASONE SODIUM PHOSPHATE 10 MG/ML IJ SOLN
10.0000 mg | Freq: Once | INTRAMUSCULAR | Status: AC
  Administered 2024-04-19: 10 mg via INTRAVENOUS
  Filled 2024-04-19: qty 1

## 2024-04-19 MED ORDER — SODIUM CHLORIDE 0.9 % IV SOLN
80.0000 mg/m2 | Freq: Once | INTRAVENOUS | Status: AC
  Administered 2024-04-19: 168 mg via INTRAVENOUS
  Filled 2024-04-19: qty 28

## 2024-04-19 MED ORDER — SODIUM CHLORIDE 0.9 % IV SOLN
10.0000 mg/kg | Freq: Once | INTRAVENOUS | Status: AC
  Administered 2024-04-19: 1000 mg via INTRAVENOUS
  Filled 2024-04-19: qty 8

## 2024-04-19 MED ORDER — NALOXEGOL OXALATE 25 MG PO TABS: 25.0000 mg | ORAL_TABLET | Freq: Every day | ORAL | 2 refills | Status: DC

## 2024-04-19 MED ORDER — DIPHENHYDRAMINE HCL 50 MG/ML IJ SOLN
50.0000 mg | Freq: Once | INTRAMUSCULAR | Status: AC
  Administered 2024-04-19: 50 mg via INTRAVENOUS
  Filled 2024-04-19: qty 1

## 2024-04-19 NOTE — Patient Instructions (Signed)
 [facility] - A DEPT OF [facility]  Discharge Instructions: Thank you for choosing [facility] to provide your oncology and hematology care.  If you have a lab appointment with the Cancer Center, please go directly to the Cancer Center and check in at the registration area.  Wear comfortable clothing and clothing appropriate for easy access to any Portacath or PICC line.   We strive to give you quality time with your provider. You may need to reschedule your appointment if you arrive late (15 or more minutes).  Arriving late affects you and other patients whose appointments are after yours.  Also, if you miss three or more appointments without notifying the office, you may be dismissed from the clinic at the provider's discretion.      For prescription refill requests, have your pharmacy contact our office and allow 72 hours for refills to be completed.    Today you received the following chemotherapy and/or immunotherapy agents taxol /mvasi       To help prevent nausea and vomiting after your treatment, we encourage you to take your nausea medication as directed.  BELOW ARE SYMPTOMS THAT SHOULD BE REPORTED IMMEDIATELY: *FEVER GREATER THAN 100.4 F (38 C) OR HIGHER *CHILLS OR SWEATING *NAUSEA AND VOMITING THAT IS NOT CONTROLLED WITH YOUR NAUSEA MEDICATION *UNUSUAL SHORTNESS OF BREATH *UNUSUAL BRUISING OR BLEEDING *URINARY PROBLEMS (pain or burning when urinating, or frequent urination) *BOWEL PROBLEMS (unusual diarrhea, constipation, pain near the anus) TENDERNESS IN MOUTH AND THROAT WITH OR WITHOUT PRESENCE OF ULCERS (sore throat, sores in mouth, or a toothache) UNUSUAL RASH, SWELLING OR PAIN  UNUSUAL VAGINAL DISCHARGE OR ITCHING   Items with * indicate a potential emergency and should be followed up as soon as possible or go to the Emergency Department if any problems should occur.  Please show the CHEMOTHERAPY ALERT CARD or IMMUNOTHERAPY  ALERT CARD at check-in to the Emergency Department and triage nurse.  Should you have questions after your visit or need to cancel or reschedule your appointment, please contact [facility] - A DEPT OF [facility]  [phone] and follow the prompts.  Office hours are 8:00 a.m. to 4:30 p.m. Monday - Friday. Please note that voicemails left after 4:00 p.m. may not be returned until the following business day.  We are closed weekends and major holidays. You have access to a nurse at all times for urgent questions. Please call the main number to the clinic [phone] and follow the prompts.  For any non-urgent questions, you may also contact your provider using MyChart. We now offer e-Visits for anyone 19 and older to request care online for non-urgent symptoms. For details visit [email or web address].   Also download the MyChart app! Go to the app store, search "MyChart", open the app, select [facility], and log in with your MyChart username and password.

## 2024-04-19 NOTE — Progress Notes (Signed)
 Discussed with Dr. Valentine Gasmen and will send rx for [facility] for opioid induced constipation.

## 2024-04-19 NOTE — Progress Notes (Signed)
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 lie along the sigmoid colon. No significant change in appearance  since previous study. These may represent recurrent or metastatic lesions. Other: Moderate free fluid throughout the abdomen and pelvis with diffuse nodularity to the omentum and mesentery. Peritoneal nodularity along the right hemidiaphragm. Changes are consistent with peritoneal carcinomatosis. Similar appearance to previous study. No free air. Musculoskeletal: No acute or significant osseous findings. Review of the MIP images confirms the above findings. IMPRESSION: 1. No evidence of significant pulmonary embolus. 2. Small bilateral pleural effusions with some basilar atelectasis, similar to prior study. 3. No metastatic disease demonstrated in the chest. 4. Diffuse peritoneal carcinomatosis with moderate abdominal and pelvic free fluid and nodularity demonstrated throughout the peritoneum, omentum, along the hemidiaphragms, and in the pelvis. Appearances are similar to prior study. 5. Cystic structures  demonstrated in the pelvis are unchanged since prior study, possibly metastatic or recurrent lesions. 6. Ventral abdominal wall hernia containing small bowel without evidence of obstruction. Electronically Signed   By: Boyce Byes M.D.   On: 03/22/2024 00:42   CT ABDOMEN PELVIS W CONTRAST Result Date: 03/22/2024 CLINICAL DATA:  Right lower quadrant abdominal pain. History of metastatic ovarian cancer. Pulmonary embolus suspected with high probability. EXAM: CT ANGIOGRAPHY CHEST CT ABDOMEN AND PELVIS WITH CONTRAST TECHNIQUE: Multidetector CT imaging of the chest was performed using the standard protocol during bolus administration of intravenous contrast. Multiplanar CT image reconstructions and MIPs were obtained to evaluate the vascular anatomy. Multidetector CT imaging of the abdomen and pelvis was performed using the standard protocol during bolus administration of intravenous contrast. RADIATION DOSE REDUCTION: This exam was performed according to the departmental dose-optimization program which includes automated exposure control, adjustment of the mA and/or kV according to patient size and/or use of iterative reconstruction technique. CONTRAST:  OMNIPAQUE  IOHEXOL  350 MG/ML SOLN COMPARISON:  CTA chest 03/21/2024. CT abdomen and pelvis 03/18/2024. FINDINGS: CTA CHEST FINDINGS Cardiovascular: Technically adequate study with good opacification of the central and segmental pulmonary arteries. Mild motion artifact. No focal filling defects are demonstrated in the pulmonary arteries. No evidence of significant pulmonary embolus. Normal heart size. No pericardial effusions. Normal caliber thoracic aorta. No aortic dissection. Great vessel origins are patent. Central venous catheter with tip at the cavoatrial junction. Mediastinum/Nodes: No enlarged mediastinal, hilar, or axillary lymph nodes. Thyroid  gland, trachea, and esophagus demonstrate no significant findings. Lungs/Pleura: Small bilateral pleural  effusions with basilar atelectasis, greater on the right. Similar appearance to previous study. Musculoskeletal: Anterior compression of T8, unchanged since prior study. No focal bone lesions demonstrated. Review of the MIP images confirms the above findings. CT ABDOMEN and PELVIS FINDINGS Hepatobiliary: No focal liver abnormality is seen. No gallstones, gallbladder wall thickening, or biliary dilatation. Pancreas: Unremarkable. No pancreatic ductal dilatation or surrounding inflammatory changes. Spleen: Normal in size without focal abnormality. Adrenals/Urinary Tract: Adrenal glands are unremarkable. Kidneys are normal, without renal calculi, focal lesion, or hydronephrosis. Bladder is unremarkable. Stomach/Bowel: Stomach, small bowel, and colon are not abnormally distended. Midline ventral abdominal wall hernia containing small bowel but without proximal obstruction. Similar appearance to previous study. Vascular/Lymphatic: No significant vascular findings are present. No enlarged abdominal or pelvic lymph nodes. Reproductive: Uterus is surgically absent. 2 cystic collections demonstrated in the pelvis, in the right pelvis measuring 4.3 cm diameter and in the left anterior pelvis measuring 4.5 cm diameter. These lie along the sigmoid colon. No significant change in appearance since previous study. These may represent recurrent or metastatic lesions. Other: Moderate free fluid throughout the abdomen and pelvis with diffuse nodularity to the omentum and mesentery. Peritoneal nodularity  along the right hemidiaphragm. Changes are consistent with peritoneal carcinomatosis. Similar appearance to previous study. No free air. Musculoskeletal: No acute or significant osseous findings. Review of the MIP images confirms the above findings. IMPRESSION: 1. No evidence of significant pulmonary embolus. 2. Small bilateral pleural effusions with some basilar atelectasis, similar to prior study. 3. No metastatic disease  demonstrated in the chest. 4. Diffuse peritoneal carcinomatosis with moderate abdominal and pelvic free fluid and nodularity demonstrated throughout the peritoneum, omentum, along the hemidiaphragms, and in the pelvis. Appearances are similar to prior study. 5. Cystic structures demonstrated in the pelvis are unchanged since prior study, possibly metastatic or recurrent lesions. 6. Ventral abdominal wall hernia containing small bowel without evidence of obstruction. Electronically Signed   By: Boyce Byes M.D.   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    # CT scan March 22, 2024-Diffuse peritoneal carcinomatosis with moderate abdominal and pelvic free fluid and nodularity demonstrated throughout the peritoneum, omentum, along the hemidiaphragms, and in the pelvis.  Ventral abdominal wall hernia containing small bowel without evidence of obstruction.  No metastatic disease demonstrated in the chest.  Folate alpha IHC testing=5%.  Also discussed with gynecology oncology.  10/03/2023- Left ventricular ejection fraction, by estimation, is 55 to 60%.   # Proceed with paclitaxel  Avastin - Cycle # 1 day-1. Labs-CBC/chemistries were reviewed with the patient.  Discussed with patient that unfortunately her disease is terminal.  Options are very limited.   # ascites- will order standing order for para  # Skin rash-grade 2-3 secondary to gemcitabine -currently on prednisone .  Taper off when done.  # Hypokalemia-patient noncompliant with Kdur BID-will send prescription for potassium liquid.  # constipation- miralax  prn- Stable.   # Hypocalcemia-pending-Vit D levels; Hypokalemia- severe -3.3  -potassium.  Continue  K-Dur twice daily; Continue slow Mag. Stable.   # Ongoing MSK joints/abdominal pain--secondary malignancy  malignancy.  On  Fentanyl  patch; and dilaudid  Dr. 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 No new or acute finding.  Currently on xarelto -  Stable.     # PN G-2/back pain- on Lyrica  50 mg TID/Cymbalta - Stable.    # Depression/ Anxiety/Insomnia-status post evaluation with February 2025 psychiatry/cerula care.  Continue Cymbalta  [at 60 mg/day]; continue trazadone    300 mg at bedtime; vraylar -/wellbutrin   s/p cerula care. Stable.    # Hot flashes: on Clonidine  patch- on Friday weekly; and clonidine  pill 0.1 qhs.Stable.    # Constipation: sec to narcotics- sec to narcotics- on senna/miralax ; IBS real- discuss with opioid antagonist.   # ACP: Discussed advance care planning-patient for now wants to continue full code.re-viist at next visit.   #IV access/Mediport-currently s/p TPA- port flush.  Stable.   Weekly Taxol ; q 2 w-Bev  PS # DISPOSITION:  # michelle - Standing order for para- limit to 4 lits # chemo today-ok without UA # add josh appt to next MD visit-  # 1 week- MD; port- labs- cbc/cmp; Taxol  # 2 week-MD /port-labs- cbc/cmp;ca-125  chemo- Urine protein- # 3 weeks-APP;  port-labs- cbc/cmp; chemo  - Dr.B   # 40 minutes face-to-face with the patient discussing the above plan of care; more than 50% of  time spent on prognosis/ natural history; counseling and coordination.        Gwyn Leos, MD 04/19/2024 9:55 AM

## 2024-04-19 NOTE — Progress Notes (Signed)
 Patient here for follow up. Patient had almost 4 liters drained yesterday. Patient feeling weak and in pain. Experiencing nausea and vomiting

## 2024-04-19 NOTE — Assessment & Plan Note (Addendum)
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    # CT scan March 22, 2024-Diffuse peritoneal carcinomatosis with moderate abdominal and pelvic free fluid and nodularity demonstrated throughout the peritoneum, omentum, along the hemidiaphragms, and in the pelvis.  Ventral abdominal wall hernia containing small bowel without evidence of obstruction.  No metastatic disease demonstrated in the chest.  Folate alpha IHC testing=5%.  Also discussed with gynecology oncology.  10/03/2023- Left ventricular ejection fraction, by estimation, is 55 to 60%.   # Proceed with paclitaxel  Avastin - Cycle # 1 day-1. Labs-CBC/chemistries were reviewed with the patient.  Discussed with patient that unfortunately her disease is terminal.  Options are very limited.   # ascites- will order standing order for para  # Skin rash-grade 2-3 secondary to gemcitabine -currently on prednisone .  Taper off when done.  # Hypokalemia-patient noncompliant with Kdur BID-will send prescription for potassium liquid.  # constipation- miralax  prn- Stable.   # Hypocalcemia-pending-Vit D levels; Hypokalemia- severe -3.3  -potassium.  Continue  K-Dur twice daily; Continue slow Mag. Stable.   # Ongoing MSK joints/abdominal pain--secondary malignancy malignancy.  On  Fentanyl  patch; and dilaudid  Dr. 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 No new or acute finding.  Currently on xarelto -  Stable.     # PN G-2/back pain- on Lyrica  50 mg TID/Cymbalta - Stable.    # Depression/ Anxiety/Insomnia-status post evaluation with February 2025 psychiatry/cerula care.  Continue Cymbalta  [at 60 mg/day]; continue trazadone    300 mg at  bedtime; vraylar -/wellbutrin   s/p cerula care. Stable.    # Hot flashes: on Clonidine  patch- on Friday weekly; and clonidine  pill 0.1 qhs.Stable.    # Constipation: sec to narcotics- sec to narcotics- on senna/miralax ; IBS real- discuss with opioid antagonist.   # ACP: Discussed advance care planning-patient for now wants to continue full code.re-viist at next visit.   #IV access/Mediport-currently s/p TPA- port flush.  Stable.   Weekly Taxol ; q 2 w-Bev  PS # DISPOSITION:  # michelle - Standing order for para- limit to 4 lits # chemo today-ok without UA # add josh appt to next MD visit-  # 1 week- MD; port- labs- cbc/cmp; Taxol  # 2 week-MD /port-labs- cbc/cmp;ca-125  chemo- Urine protein- # 3 weeks-APP;  port-labs- cbc/cmp; chemo  - Dr.B   # 40 minutes face-to-face with the patient discussing the above plan of care; more than 50% of time spent on prognosis/ natural history; counseling and coordination.

## 2024-04-22 ENCOUNTER — Encounter: Payer: Self-pay | Admitting: Internal Medicine

## 2024-04-23 ENCOUNTER — Encounter: Payer: Self-pay | Admitting: Internal Medicine

## 2024-04-24 ENCOUNTER — Other Ambulatory Visit: Payer: Self-pay | Admitting: Hospice and Palliative Medicine

## 2024-04-24 ENCOUNTER — Encounter: Payer: Self-pay | Admitting: Hospice and Palliative Medicine

## 2024-04-24 ENCOUNTER — Other Ambulatory Visit: Payer: Self-pay

## 2024-04-24 DIAGNOSIS — C482 Malignant neoplasm of peritoneum, unspecified: Secondary | ICD-10-CM | POA: Diagnosis not present

## 2024-04-24 DIAGNOSIS — F332 Major depressive disorder, recurrent severe without psychotic features: Secondary | ICD-10-CM | POA: Diagnosis not present

## 2024-04-24 MED ORDER — HYDROMORPHONE HCL 4 MG PO TABS: 4.0000 mg | ORAL_TABLET | ORAL | 0 refills | Status: DC | PRN

## 2024-04-24 NOTE — Telephone Encounter (Signed)
 Rx pended and sent to Rudi Corwin, NP

## 2024-04-24 NOTE — Telephone Encounter (Signed)
 Rx sent to [facility] today and I confirmed with them they are out of stock.  Patient request sending to [facility] at the [address].

## 2024-04-25 ENCOUNTER — Ambulatory Visit

## 2024-04-25 ENCOUNTER — Ambulatory Visit: Admitting: Internal Medicine

## 2024-04-25 ENCOUNTER — Other Ambulatory Visit

## 2024-04-25 ENCOUNTER — Encounter: Payer: Self-pay | Admitting: Internal Medicine

## 2024-04-25 MED ORDER — HYDROMORPHONE HCL 4 MG PO TABS: 4.0000 mg | ORAL_TABLET | ORAL | 0 refills | Status: DC | PRN

## 2024-04-26 ENCOUNTER — Encounter: Payer: Self-pay | Admitting: Internal Medicine

## 2024-04-26 ENCOUNTER — Inpatient Hospital Stay (HOSPITAL_BASED_OUTPATIENT_CLINIC_OR_DEPARTMENT_OTHER): Admitting: Internal Medicine

## 2024-04-26 ENCOUNTER — Inpatient Hospital Stay

## 2024-04-26 ENCOUNTER — Inpatient Hospital Stay: Attending: Internal Medicine

## 2024-04-26 VITALS — BP 99/71 | HR 88 | Temp 98.2°F | Resp 16 | Ht 63.0 in | Wt 201.0 lb

## 2024-04-26 VITALS — BP 91/58 | HR 56

## 2024-04-26 DIAGNOSIS — Z7189 Other specified counseling: Secondary | ICD-10-CM

## 2024-04-26 DIAGNOSIS — C482 Malignant neoplasm of peritoneum, unspecified: Secondary | ICD-10-CM

## 2024-04-26 DIAGNOSIS — Z79899 Other long term (current) drug therapy: Secondary | ICD-10-CM | POA: Insufficient documentation

## 2024-04-26 DIAGNOSIS — Z5111 Encounter for antineoplastic chemotherapy: Secondary | ICD-10-CM | POA: Diagnosis present

## 2024-04-26 DIAGNOSIS — C786 Secondary malignant neoplasm of retroperitoneum and peritoneum: Secondary | ICD-10-CM | POA: Insufficient documentation

## 2024-04-26 DIAGNOSIS — Z5112 Encounter for antineoplastic immunotherapy: Secondary | ICD-10-CM | POA: Diagnosis present

## 2024-04-26 DIAGNOSIS — R112 Nausea with vomiting, unspecified: Secondary | ICD-10-CM | POA: Insufficient documentation

## 2024-04-26 LAB — CBC WITH DIFFERENTIAL (CANCER CENTER ONLY)
Abs Immature Granulocytes: 0.1 10*3/uL — ABNORMAL HIGH (ref 0.00–0.07)
Basophils Absolute: 0.1 10*3/uL (ref 0.0–0.1)
Basophils Relative: 1 %
Eosinophils Absolute: 0.1 10*3/uL (ref 0.0–0.5)
Eosinophils Relative: 2 %
HCT: 35.9 % — ABNORMAL LOW (ref 36.0–46.0)
Hemoglobin: 11.5 g/dL — ABNORMAL LOW (ref 12.0–15.0)
Immature Granulocytes: 2 %
Lymphocytes Relative: 29 %
Lymphs Abs: 1.5 10*3/uL (ref 0.7–4.0)
MCH: 30.4 pg (ref 26.0–34.0)
MCHC: 32 g/dL (ref 30.0–36.0)
MCV: 95 fL (ref 80.0–100.0)
Monocytes Absolute: 0.4 10*3/uL (ref 0.1–1.0)
Monocytes Relative: 8 %
Neutro Abs: 3.1 10*3/uL (ref 1.7–7.7)
Neutrophils Relative %: 58 %
Platelet Count: 225 10*3/uL (ref 150–400)
RBC: 3.78 MIL/uL — ABNORMAL LOW (ref 3.87–5.11)
RDW: 14.9 % (ref 11.5–15.5)
WBC Count: 5.3 10*3/uL (ref 4.0–10.5)
nRBC: 0 % (ref 0.0–0.2)

## 2024-04-26 LAB — CMP (CANCER CENTER ONLY)
ALT: 10 U/L (ref 0–44)
AST: 16 U/L (ref 15–41)
Albumin: 2.8 g/dL — ABNORMAL LOW (ref 3.5–5.0)
Alkaline Phosphatase: 83 U/L (ref 38–126)
Anion gap: 10 (ref 5–15)
BUN: 7 mg/dL (ref 6–20)
CO2: 22 mmol/L (ref 22–32)
Calcium: 8.4 mg/dL — ABNORMAL LOW (ref 8.9–10.3)
Chloride: 104 mmol/L (ref 98–111)
Creatinine: 0.8 mg/dL (ref 0.44–1.00)
GFR, Estimated: >60 mL/min (ref >60)
Glucose, Bld: 124 mg/dL — ABNORMAL HIGH (ref 70–99)
Potassium: 3.4 mmol/L — ABNORMAL LOW (ref 3.5–5.1)
Sodium: 136 mmol/L (ref 135–145)
Total Bilirubin: 0.3 mg/dL (ref 0.0–1.2)
Total Protein: 6.6 g/dL (ref 6.5–8.1)

## 2024-04-26 LAB — CA 125: Cancer Antigen (CA) 125: 4073 U/mL — ABNORMAL HIGH (ref 0.0–38.1)

## 2024-04-26 MED ORDER — SODIUM CHLORIDE 0.9% FLUSH
10.0000 mL | INTRAVENOUS | Status: DC | PRN
Start: 2024-04-26 — End: 2024-04-26
  Administered 2024-04-26: 10 mL
  Filled 2024-04-26: qty 10

## 2024-04-26 MED ORDER — HEPARIN SOD (PORK) LOCK FLUSH 100 UNIT/ML IV SOLN
500.0000 [IU] | Freq: Once | INTRAVENOUS | Status: AC | PRN
  Administered 2024-04-26: 500 [IU]
  Filled 2024-04-26: qty 5

## 2024-04-26 MED ORDER — DIPHENHYDRAMINE HCL 50 MG/ML IJ SOLN
50.0000 mg | Freq: Once | INTRAMUSCULAR | Status: AC
  Administered 2024-04-26: 50 mg via INTRAVENOUS
  Filled 2024-04-26: qty 1

## 2024-04-26 MED ORDER — ONDANSETRON HCL 8 MG PO TABS: ORAL_TABLET | ORAL | 1 refills | Status: DC

## 2024-04-26 MED ORDER — FAMOTIDINE IN NACL 20-0.9 MG/50ML-% IV SOLN
20.0000 mg | Freq: Once | INTRAVENOUS | Status: AC
  Administered 2024-04-26: 20 mg via INTRAVENOUS
  Filled 2024-04-26: qty 50

## 2024-04-26 MED ORDER — DEXAMETHASONE SODIUM PHOSPHATE 10 MG/ML IJ SOLN
10.0000 mg | Freq: Once | INTRAMUSCULAR | Status: AC
  Administered 2024-04-26: 10 mg via INTRAVENOUS
  Filled 2024-04-26: qty 1

## 2024-04-26 MED ORDER — SODIUM CHLORIDE 0.9 % IV SOLN
INTRAVENOUS | Status: DC
  Filled 2024-04-26: qty 250

## 2024-04-26 MED ORDER — SODIUM CHLORIDE 0.9 % IV SOLN
80.0000 mg/m2 | Freq: Once | INTRAVENOUS | Status: AC
  Administered 2024-04-26: 168 mg via INTRAVENOUS
  Filled 2024-04-26: qty 28

## 2024-04-26 MED ORDER — CLONIDINE 0.3 MG/24HR TD PTWK: 0.3000 mg | MEDICATED_PATCH | TRANSDERMAL | 3 refills | Status: DC
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
## 2024-04-26 NOTE — Assessment & Plan Note (Addendum)
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    # CT scan March 22, 2024-Diffuse peritoneal carcinomatosis with moderate abdominal and pelvic free fluid and nodularity demonstrated throughout the peritoneum, omentum, along the hemidiaphragms, and in the pelvis.  Ventral abdominal wall hernia containing small bowel without evidence of obstruction.  No metastatic disease demonstrated in the chest.  Folate alpha IHC testing=5%.  Also discussed with gynecology oncology.  10/03/2023- Left ventricular ejection fraction, by estimation, is 55 to 60%.   # Proceed with paclitaxel  Avastin - Cycle # 1 day-8. Labs-CBC/chemistries were reviewed with the patient.  Discussed with patient that unfortunately her disease is terminal.  Options are very limited.   # ascites- malignant- HOLD today-  para today-   # Hypokalemia-patient noncompliant with Kdur BID-will send prescription for potassium liquid.  # constipation- miralax  prn/ movantik - Stable.   # Hypocalcemia-pending-Vit D levels; Hypokalemia- severe -3.3  -potassium.  Continue  K-Dur twice daily; Continue slow Mag. Stable.   # Ongoing MSK joints/abdominal pain--secondary malignancy malignancy.  On  Fentanyl  patch; and dilaudid  Dr. 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 No new or acute finding.  Currently on xarelto -  Stable.     # PN G-2/back pain- on Lyrica  50 mg TID/Cymbalta - Stable.    # Depression/ Anxiety/Insomnia-status post evaluation with February 2025 psychiatry/cerula care.  Continue Cymbalta  [at 60 mg/day]; continue trazadone    300 mg at bedtime; vraylar -/wellbutrin   s/p cerula care. Stable.    # Hot flashes: on  Clonidine  patch- on Friday weekly; and clonidine  pill 0.1 qhs.Stable.    # Constipation: sec to narcotics- sec to narcotics- on senna/miralax ; IBS real- discuss with opioid antagonist.   # ACP: Discussed advance care planning-patient for now wants to continue full code.   #IV access/Mediport-currently s/p TPA- port flush.  Stable.   Weekly Taxol ; q 2 w-Bev  PS # DISPOSITION:  # chemo today-ok without UA # 1 week- MD; port- labs- cbc/cmp; chemo # 2 week-APP /port-labs- cbc/cmp;ca-125  chemo- Urine protein- # 3 weeks-APP;  port-labs- cbc/cmp; chemo  # 4 weeks-MD;  port-labs- cbc/cmp; chemo Urine protein-- Dr.B

## 2024-04-26 NOTE — Progress Notes (Signed)
 Patient here for follow up. Patient complains of fatigue, weak, abdominal pain at a 7. Patient states she vomited 5 times over last weekend.

## 2024-04-26 NOTE — Patient Instructions (Signed)
 [facility] - A DEPT OF [facility]  Discharge Instructions: Thank you for choosing [facility] to provide your oncology and hematology care.  If you have a lab appointment with the Cancer Center, please go directly to the Cancer Center and check in at the registration area.  Wear comfortable clothing and clothing appropriate for easy access to any Portacath or PICC line.   We strive to give you quality time with your provider. You may need to reschedule your appointment if you arrive late (15 or more minutes).  Arriving late affects you and other patients whose appointments are after yours.  Also, if you miss three or more appointments without notifying the office, you may be dismissed from the clinic at the provider's discretion.      For prescription refill requests, have your pharmacy contact our office and allow 72 hours for refills to be completed.    Today you received the following chemotherapy and/or immunotherapy agents taxol       To help prevent nausea and vomiting after your treatment, we encourage you to take your nausea medication as directed.  BELOW ARE SYMPTOMS THAT SHOULD BE REPORTED IMMEDIATELY: *FEVER GREATER THAN 100.4 F (38 C) OR HIGHER *CHILLS OR SWEATING *NAUSEA AND VOMITING THAT IS NOT CONTROLLED WITH YOUR NAUSEA MEDICATION *UNUSUAL SHORTNESS OF BREATH *UNUSUAL BRUISING OR BLEEDING *URINARY PROBLEMS (pain or burning when urinating, or frequent urination) *BOWEL PROBLEMS (unusual diarrhea, constipation, pain near the anus) TENDERNESS IN MOUTH AND THROAT WITH OR WITHOUT PRESENCE OF ULCERS (sore throat, sores in mouth, or a toothache) UNUSUAL RASH, SWELLING OR PAIN  UNUSUAL VAGINAL DISCHARGE OR ITCHING   Items with * indicate a potential emergency and should be followed up as soon as possible or go to the Emergency Department if any problems should occur.  Please show the CHEMOTHERAPY ALERT CARD or IMMUNOTHERAPY ALERT  CARD at check-in to the Emergency Department and triage nurse.  Should you have questions after your visit or need to cancel or reschedule your appointment, please contact [facility] - A DEPT OF [facility]  [phone] and follow the prompts.  Office hours are 8:00 a.m. to 4:30 p.m. Monday - Friday. Please note that voicemails left after 4:00 p.m. may not be returned until the following business day.  We are closed weekends and major holidays. You have access to a nurse at all times for urgent questions. Please call the main number to the clinic [phone] and follow the prompts.  For any non-urgent questions, you may also contact your provider using MyChart. We now offer e-Visits for anyone 18 and older to request care online for non-urgent symptoms. For details visit [email or web address].   Also download the MyChart app! Go to the app store, search "MyChart", open the app, select [facility], and log in with your MyChart username and password.

## 2024-04-29 ENCOUNTER — Encounter: Payer: Self-pay | Admitting: Hospice and Palliative Medicine

## 2024-04-29 ENCOUNTER — Other Ambulatory Visit: Payer: Self-pay | Admitting: *Deleted

## 2024-04-29 MED ORDER — FENTANYL 75 MCG/HR TD PT72: 1.0000 | MEDICATED_PATCH | TRANSDERMAL | 0 refills | Status: DC

## 2024-04-29 NOTE — Telephone Encounter (Signed)
 Needs refills on pain patches. Send to [facility] [address] [place], [name]

## 2024-04-30 ENCOUNTER — Encounter: Payer: Self-pay | Admitting: Internal Medicine

## 2024-05-02 ENCOUNTER — Ambulatory Visit

## 2024-05-02 ENCOUNTER — Other Ambulatory Visit

## 2024-05-02 ENCOUNTER — Ambulatory Visit: Admitting: Internal Medicine

## 2024-05-02 ENCOUNTER — Encounter: Payer: Self-pay | Admitting: Internal Medicine

## 2024-05-03 ENCOUNTER — Inpatient Hospital Stay

## 2024-05-03 ENCOUNTER — Inpatient Hospital Stay (HOSPITAL_BASED_OUTPATIENT_CLINIC_OR_DEPARTMENT_OTHER): Admitting: Internal Medicine

## 2024-05-03 ENCOUNTER — Inpatient Hospital Stay (HOSPITAL_BASED_OUTPATIENT_CLINIC_OR_DEPARTMENT_OTHER): Admitting: Hospice and Palliative Medicine

## 2024-05-03 ENCOUNTER — Encounter: Payer: Self-pay | Admitting: Internal Medicine

## 2024-05-03 VITALS — BP 107/85 | HR 91 | Temp 96.2°F | Resp 16 | Ht 63.0 in | Wt 193.8 lb

## 2024-05-03 DIAGNOSIS — Z7189 Other specified counseling: Secondary | ICD-10-CM | POA: Diagnosis not present

## 2024-05-03 DIAGNOSIS — C482 Malignant neoplasm of peritoneum, unspecified: Secondary | ICD-10-CM

## 2024-05-03 DIAGNOSIS — Z5112 Encounter for antineoplastic immunotherapy: Secondary | ICD-10-CM | POA: Diagnosis not present

## 2024-05-03 LAB — CBC WITH DIFFERENTIAL (CANCER CENTER ONLY)
Abs Immature Granulocytes: 0.04 10*3/uL (ref 0.00–0.07)
Basophils Absolute: 0.1 10*3/uL (ref 0.0–0.1)
Basophils Relative: 1 %
Eosinophils Absolute: 0.1 10*3/uL (ref 0.0–0.5)
Eosinophils Relative: 3 %
HCT: 34.5 % — ABNORMAL LOW (ref 36.0–46.0)
Hemoglobin: 11.2 g/dL — ABNORMAL LOW (ref 12.0–15.0)
Immature Granulocytes: 1 %
Lymphocytes Relative: 40 %
Lymphs Abs: 1.4 10*3/uL (ref 0.7–4.0)
MCH: 30.5 pg (ref 26.0–34.0)
MCHC: 32.5 g/dL (ref 30.0–36.0)
MCV: 94 fL (ref 80.0–100.0)
Monocytes Absolute: 0.3 10*3/uL (ref 0.1–1.0)
Monocytes Relative: 7 %
Neutro Abs: 1.7 10*3/uL (ref 1.7–7.7)
Neutrophils Relative %: 48 %
Platelet Count: 212 10*3/uL (ref 150–400)
RBC: 3.67 MIL/uL — ABNORMAL LOW (ref 3.87–5.11)
RDW: 15.3 % (ref 11.5–15.5)
WBC Count: 3.5 10*3/uL — ABNORMAL LOW (ref 4.0–10.5)
nRBC: 0 % (ref 0.0–0.2)

## 2024-05-03 LAB — CMP (CANCER CENTER ONLY)
ALT: 10 U/L (ref 0–44)
AST: 19 U/L (ref 15–41)
Albumin: 3.1 g/dL — ABNORMAL LOW (ref 3.5–5.0)
Alkaline Phosphatase: 85 U/L (ref 38–126)
Anion gap: 11 (ref 5–15)
BUN: 8 mg/dL (ref 6–20)
CO2: 23 mmol/L (ref 22–32)
Calcium: 8.6 mg/dL — ABNORMAL LOW (ref 8.9–10.3)
Chloride: 101 mmol/L (ref 98–111)
Creatinine: 0.82 mg/dL (ref 0.44–1.00)
GFR, Estimated: >60 mL/min (ref >60)
Glucose, Bld: 116 mg/dL — ABNORMAL HIGH (ref 70–99)
Potassium: 3.2 mmol/L — ABNORMAL LOW (ref 3.5–5.1)
Sodium: 135 mmol/L (ref 135–145)
Total Bilirubin: 0.7 mg/dL (ref 0.0–1.2)
Total Protein: 6.6 g/dL (ref 6.5–8.1)

## 2024-05-03 LAB — PROTEIN, URINE, RANDOM: Total Protein, Urine: 77 mg/dL

## 2024-05-03 MED ORDER — SODIUM CHLORIDE 0.9 % IV SOLN
INTRAVENOUS | Status: DC
  Filled 2024-05-03: qty 250

## 2024-05-03 MED ORDER — SODIUM CHLORIDE 0.9 % IV SOLN
10.0000 mg/kg | Freq: Once | INTRAVENOUS | Status: AC
  Administered 2024-05-03: 900 mg via INTRAVENOUS
  Filled 2024-05-03: qty 4

## 2024-05-03 MED ORDER — HEPARIN SOD (PORK) LOCK FLUSH 100 UNIT/ML IV SOLN
500.0000 [IU] | Freq: Once | INTRAVENOUS | Status: DC | PRN
  Filled 2024-05-03: qty 5

## 2024-05-03 MED ORDER — DEXAMETHASONE SODIUM PHOSPHATE 10 MG/ML IJ SOLN
10.0000 mg | Freq: Once | INTRAMUSCULAR | Status: AC
  Administered 2024-05-03: 10 mg via INTRAVENOUS
  Filled 2024-05-03: qty 1

## 2024-05-03 MED ORDER — DIPHENHYDRAMINE HCL 50 MG/ML IJ SOLN
50.0000 mg | Freq: Once | INTRAMUSCULAR | Status: AC
  Administered 2024-05-03: 50 mg via INTRAVENOUS
  Filled 2024-05-03: qty 1

## 2024-05-03 MED ORDER — SODIUM CHLORIDE 0.9 % IV SOLN: 10.0000 mg/kg | Freq: Once | INTRAVENOUS | Status: DC

## 2024-05-03 MED ORDER — SODIUM CHLORIDE 0.9 % IV SOLN
80.0000 mg/m2 | Freq: Once | INTRAVENOUS | Status: AC
Start: 2024-05-03 — End: 2024-05-03
  Administered 2024-05-03: 168 mg via INTRAVENOUS
  Filled 2024-05-03: qty 28

## 2024-05-03 MED ORDER — FAMOTIDINE IN NACL 20-0.9 MG/50ML-% IV SOLN
20.0000 mg | Freq: Once | INTRAVENOUS | Status: AC
  Administered 2024-05-03: 20 mg via INTRAVENOUS
  Filled 2024-05-03: qty 50

## 2024-05-03 MED ORDER — FENTANYL 50 MCG/HR TD PT72: 1.0000 | MEDICATED_PATCH | TRANSDERMAL | 0 refills | Status: DC

## 2024-05-03 NOTE — Addendum Note (Signed)
 Addended by: Dominique Frieze A on: 05/03/2024 10:27 AM   Modules accepted: Orders
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
## 2024-05-03 NOTE — Assessment & Plan Note (Addendum)
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    # CT scan March 22, 2024-Diffuse peritoneal carcinomatosis with moderate abdominal and pelvic free fluid and nodularity demonstrated throughout the peritoneum, omentum, along the hemidiaphragms, and in the pelvis.  Ventral abdominal wall hernia containing small bowel without evidence of obstruction.  No metastatic disease demonstrated in the chest.  Folate alpha IHC testing=5%.  Also discussed with gynecology oncology.  10/03/2023- Left ventricular ejection fraction, by estimation, is 55 to 60%.   # Proceed with paclitaxel  Avastin - Cycle # 1 day-15. Labs-CBC/chemistries were reviewed with the patient.  Discussed with patient that unfortunately her disease is terminal.  Options are very limited. Stable.  # ascites- malignant- HOLD today-  para today-   # constipation- miralax  prn/ movantik - Stable.   # Hypocalcemia-pending-Vit D levels; Hypokalemia- severe -3.3  -potassium.  Continue  K-Dur twice daily; Continue slow Mag. Stable.   # Ongoing MSK joints/abdominal pain--secondary malignancy malignancy.  On  Fentanyl  patch; and dilaudid  Dr. 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 No new or acute finding.  Currently on xarelto -  Stable.     # PN G-2/back pain- Cymbalta - Stable.    # Depression/ Anxiety/Insomnia-status post evaluation with February 2025 psychiatry/cerula care.  Continue Cymbalta  [at 60 mg/day]; continue trazadone    300 mg at bedtime; vraylar -/wellbutrin   s/p cerula care. Stable.    # Hot flashes: on Clonidine  patch- on Friday weekly; and clonidine  pill 0.1 qhs.Stable.    # Constipation: sec to narcotics- sec to narcotics- improved on opioid antagonist-  movantik   # ACP: Discussed advance care planning-patient for now wants to continue full code.   #IV access/Mediport-currently s/p TPA- port flush.  Stable.   # Debility: ok with wheel chair.   Weekly Taxol ; q 2 w-Bev  PS # DISPOSITION:  # order random urine protein/every 2 weeks- #  ok with wheel chair.  # chemo today-ok without UA # 1 week- APP / port- labs- cbc/cmp; chemo # 2 week-MD port-labs- cbc/cmp # 3 weeks-MD  port-labs- cbc/cmp; chemo  # 4 weeks-MD;  port-labs- cbc/cmp; chemo  Dr.B

## 2024-05-03 NOTE — Progress Notes (Signed)
 No appetite, occ. Premier drink. She says they are too sweet. Having nausea and vomiting. Has only had 1 BM in 2 weeks. No constipation.  Can you send in an order for a wheelchair for use with appts or having to go out and do things?

## 2024-05-03 NOTE — Patient Instructions (Signed)
 [facility] - A DEPT OF [facility]  Discharge Instructions: Thank you for choosing [facility] to provide your oncology and hematology care.  If you have a lab appointment with the Cancer Center, please go directly to the Cancer Center and check in at the registration area.  Wear comfortable clothing and clothing appropriate for easy access to any Portacath or PICC line.   We strive to give you quality time with your provider. You may need to reschedule your appointment if you arrive late (15 or more minutes).  Arriving late affects you and other patients whose appointments are after yours.  Also, if you miss three or more appointments without notifying the office, you may be dismissed from the clinic at the provider's discretion.      For prescription refill requests, have your pharmacy contact our office and allow 72 hours for refills to be completed.    Today you received the following chemotherapy and/or immunotherapy agents MVASI  & Taxol       To help prevent nausea and vomiting after your treatment, we encourage you to take your nausea medication as directed.  BELOW ARE SYMPTOMS THAT SHOULD BE REPORTED IMMEDIATELY: *FEVER GREATER THAN 100.4 F (38 C) OR HIGHER *CHILLS OR SWEATING *NAUSEA AND VOMITING THAT IS NOT CONTROLLED WITH YOUR NAUSEA MEDICATION *UNUSUAL SHORTNESS OF BREATH *UNUSUAL BRUISING OR BLEEDING *URINARY PROBLEMS (pain or burning when urinating, or frequent urination) *BOWEL PROBLEMS (unusual diarrhea, constipation, pain near the anus) TENDERNESS IN MOUTH AND THROAT WITH OR WITHOUT PRESENCE OF ULCERS (sore throat, sores in mouth, or a toothache) UNUSUAL RASH, SWELLING OR PAIN  UNUSUAL VAGINAL DISCHARGE OR ITCHING   Items with * indicate a potential emergency and should be followed up as soon as possible or go to the Emergency Department if any problems should occur.  Please show the CHEMOTHERAPY ALERT CARD or  IMMUNOTHERAPY ALERT CARD at check-in to the Emergency Department and triage nurse.  Should you have questions after your visit or need to cancel or reschedule your appointment, please contact [facility] - A DEPT OF [facility]  [phone] and follow the prompts.  Office hours are 8:00 a.m. to 4:30 p.m. Monday - Friday. Please note that voicemails left after 4:00 p.m. may not be returned until the following business day.  We are closed weekends and major holidays. You have access to a nurse at all times for urgent questions. Please call the main number to the clinic [phone] and follow the prompts.  For any non-urgent questions, you may also contact your provider using MyChart. We now offer e-Visits for anyone 88 and older to request care online for non-urgent symptoms. For details visit [email or web address].   Also download the MyChart app! Go to the app store, search "MyChart", open the app, select [facility], and log in with your MyChart username and password.

## 2024-05-03 NOTE — Progress Notes (Signed)
 Palliative Medicine [facility] at [facility] [phone] [phone]   Name: [name] Date: 05/03/2024 MRN: [id number]  DOB: 1971/10/22  Patient Care Team: Sowles, Krichna, MD as PCP - General (Family Medicine) Constancia Delton, MD as PCP - Cardiology (Cardiology) Rochell Chroman, RN as Oncology Nurse Navigator Demone Lyles, Carlene Che, NP as Nurse Practitioner (Hospice and Palliative Medicine) Gwyn Leos, MD as Consulting Physician (Internal Medicine) Nobie Batch, MD as Referring Physician (Obstetrics) Marquita Situ, Magali Schmitz, MD as Consulting Physician (General Surgery) Queenie Brunet, RN as Registered Nurse (Oncology)    REASON FOR CONSULTATION: [name] is a 53 y.o. 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  Patient has also had chronic abdominal pain.  She was referred to palliative care to help address goals and manage ongoing symptoms.   SOCIAL HISTORY:     reports that she has been smoking cigarettes. She has a 30 pack-year smoking history. She has never used smokeless tobacco. She reports that she does not currently use alcohol. She reports that she does not use drugs.  Patient unmarried.  Lives at home with her daughter.  Has a sister who lives in [place].  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   Depression    Drug-induced androgenic alopecia    Dyslipidemia 05/17/2022   Dysrhythmia    Family history of breast cancer    GERD (gastroesophageal reflux disease)    Goals of care, counseling/discussion 12/16/2019   Hypertension    Insomnia    Menorrhagia    Migraines    Moderate episode of recurrent major depressive disorder (HCC) 06/17/2022   Osteoarthritis    back   Ovarian cancer (HCC) 12/10/2019   Personal history of chemotherapy    ovarian cancer   Plantar fasciitis     PAST SURGICAL HISTORY:  Past Surgical History:  Procedure Laterality Date   ABDOMINAL HYSTERECTOMY  03/2020   APPENDECTOMY     LSC but "ruptured when they did the surgery"   BREAST BIOPSY Left 01/04/2021   MRI BX   CESAREAN SECTION     CYSTOSCOPY N/A 04/08/2020   Procedure: CYSTOSCOPY;  Surgeon: Nobie Batch, MD;  [name];  Service: Gynecology;  [name];   INSERTION OF MESH N/A 04/26/2021   Procedure: INSERTION OF MESH;  Surgeon: Marshall Skeeter, MD;  [name];  Service: General;  [name];   IR THORACENTESIS ASP PLEURAL SPACE W/IMG GUIDE  12/06/2019   IUD REMOVAL N/A 04/08/2020   Procedure: INTRAUTERINE DEVICE (IUD) REMOVAL;  Surgeon: Nobie Batch, MD;  [name];  Service: Gynecology;  [name];   PARACENTESIS     x6   PORTA CATH INSERTION N/A 04/23/2020   Procedure: PORTA CATH INSERTION;  Surgeon: Celso College, MD;  Location: [facility] INVASIVE  CV LAB;  Service: Cardiovascular;  [name];   TUBAL LIGATION     at time of CSxn   VENTRAL HERNIA REPAIR N/A 04/26/2021   Procedure: HERNIA REPAIR VENTRAL ADULT;  Surgeon: Marshall Skeeter, MD;  [name];  Service: General;  [name];  need RNFA for the case   WRIST SURGERY Left 11/21/2016   plates and screws inserted     HEMATOLOGY/ONCOLOGY HISTORY:  Oncology History Overview Note  # DEC 2020- ADENO CA [s/p Pleural effusion]; [name]; [name]; abdominal ascites status post paracentesis x2; adenocarcinoma; [name]; omental caking/peritoneal disease/no obvious evidence of bowel involvement; no adnexal masses readily noted; Ca [phone].   # 12/23/2019- Carbo-Taxol  #1; Jan 18 th 2021- #2 carbo-Taxol -Bev status post 4 cycles-April 08, 2020-debulking surgery [Dr. Secord] miliary disease noted post surgery. Carbo-Taxol -Avastin  x6  # July 6th 2021- Avastin  q 3 W+ OLAPARIB  300 mg BID  # OCT 26th, 2021-recurrent anemia [hemoglobin 7.5]; HELD Olaparib   # DEC 9th 2021- olaparib  to 250 BID; [name], [name]; [name] ; HOLD AVASTIN  [last 2/11]sec to upcoming hernia repair  # June 20th, 2022 ~restart olaparib  200 mg twice daily_+ Avastin .   # Jan 15th 2021- L UE SVTxarelto; March 10th-stop Xarelto  [gum bleeding-platelets 70s/Avastin ]; April 15th 2021-started Xarelto  20 mg post surgery; mid May 2021-Xarelto  10 mg a day/prophylaxis.  # JAN 2024- Brain MRI with and without contrast-nonocclusive dural venous sinus thrombosis noted-without any brain infarct or any metastatic disease to the brain.  Lovenox / NOAK for long-term needs. HOLD avastin .  # APRIL 26th  2024#1-- Doxil -carboplatin  every 4 weeks x6 cyles- Chemo holiday- started OCT 2024.   # March 28 the CT scan-progressive peritoneal carcinomatosis.  # APRIL 4th, 2025- gemcitabine  single agent cycle #1 day 1 day 8-every 21 days- Disocntinued sec to skin rash/pt relcutance  # April 25th start Taxol -Avatsin-  # BRCA-1 [on screening; s/p genetics counseling; [name]; July 2019- 2-3cm-right complex ovarian cyst- likely benign/hemorrhagic [also 2011].  # #December 2021 screening breast MRI-left  breast 9 mm lesion biopsy; apocrine metaplasia/benign; annual MRI.    DIAGNOSIS: Primary peritoneal adenocarcinoma  STAGE:   IV      ;  GOALS: control    Primary peritoneal carcinomatosis (HCC)  12/16/2019 Initial Diagnosis   Primary peritoneal adenocarcinoma (HCC)   12/23/2019 - 07/08/2022 Chemotherapy   Patient is on Treatment Plan : Carboplatin  + Paclitaxel  + Mvasi  q21d     12/23/2019 - 12/20/2022 Chemotherapy   Patient is on Treatment Plan : OVARIAN Carboplatin  + Paclitaxel  + Bevacizumab  q21d      01/07/2021 Cancer Staging   Staging form: Ovary, Fallopian Tube, and Primary Peritoneal Carcinoma, AJCC 8th Edition - Clinical: Stage IVA (pM1a) - Signed by Gwyn Leos, MD on 01/07/2021   04/21/2023 - 10/23/2023 Chemotherapy   Patient is on Treatment Plan : OVARIAN RECURRENT Liposomal Doxorubicin  + Carboplatin  q28d X 6 Cycles     03/28/2024 - 03/28/2024 Chemotherapy   Patient is on Treatment Plan : BREAST Gemcitabine  D1,8 (800) q21d     04/19/2024 -  Chemotherapy   Patient is on Treatment Plan : OVARIAN Paclitaxel   Z6,1,09,60 + Bevacizumab  D1,15 q28d        ALLERGIES:  is allergic to hydroxyzine  hcl.  MEDICATIONS:  Current Outpatient Medications  Medication Sig Dispense Refill   buPROPion  (WELLBUTRIN  XL) 300 MG 24 hr tablet Take 1 tablet (300 mg  total) by mouth daily. 90 tablet 1   cariprazine  (VRAYLAR ) 1.5 MG capsule Take 1 capsule (1.5 mg total) by mouth daily. 90 capsule 1   cloNIDine  (CATAPRES  - DOSED IN MG/24 HR) 0.3 mg/24hr patch Place 1 patch (0.3 mg total) onto the skin once a week. Friday 12 patch 3   cloNIDine  (CATAPRES ) 0.1 MG tablet Take 1 tablet (0.1 mg total) by mouth at bedtime. 90 tablet 1   diphenhydrAMINE  (BENADRYL ) 25 mg capsule Take 25 mg by mouth every 6 (six) hours as needed for allergies or itching.     docusate sodium  (COLACE) 100 MG capsule Take 100 mg by mouth daily as needed for moderate constipation or mild constipation.     DULoxetine  (CYMBALTA )  60 MG capsule TAKE 1 CAPSULE BY MOUTH DAILY 90 capsule 0   fentaNYL  (DURAGESIC ) 75 MCG/HR Place 1 patch onto the skin every 3 (three) days. 10 patch 0   HYDROmorphone  (DILAUDID ) 4 MG tablet Take 1 tablet (4 mg total) by mouth every 4 (four) hours as needed for severe pain (pain score 7-10). 60 tablet 0   lactobacillus acidophilus (BACID) TABS tablet Take 2 tablets by mouth daily at 12 noon.     LORazepam  (ATIVAN ) 0.5 MG tablet Take 1 tablet (0.5 mg total) by mouth every 12 (twelve) hours as needed for anxiety. 60 tablet 0   Magnesium  Cl-Calcium  Carbonate (SLOW MAGNESIUM /CALCIUM ) 70-117 MG TBEC Take 1 tablet by mouth 2 (two) times daily. 180 tablet 1   Multiple Vitamin (MULTIVITAMIN WITH MINERALS) TABS tablet Take 1 tablet by mouth daily.     naloxegol  oxalate (MOVANTIK ) 25 MG TABS tablet Take 1 tablet (25 mg total) by mouth daily. 30 tablet 2   naloxone  (NARCAN ) nasal spray 4 mg/0.1 mL SPRAY 1 SPRAY INTO ONE NOSTRIL AS DIRECTED FOR OPIOID OVERDOSE (TURN PERSON ON SIDE AFTER DOSE. IF NO RESPONSE IN 2-3 MINUTES OR PERSON RESPONDS BUT RELAPSES, REPEAT USING A NEW SPRAY DEVICE AND SPRAY INTO THE OTHER NOSTRIL. CALL 911 AFTER USE.) * EMERGENCY USE ONLY * 1 each 0   nystatin  (MYCOSTATIN /NYSTOP ) powder Apply 1 Application topically 3 (three) times daily. 60 g 0   omeprazole  (PRILOSEC) 20 MG capsule TAKE 1 CAPSULE BY MOUTH DAILY 90 capsule 0   ondansetron  (ZOFRAN ) 8 MG tablet One pill every 8 hours as needed for nausea/vomitting. 90 tablet 1   polyethylene glycol powder (GLYCOLAX /MIRALAX ) 17 GM/SCOOP powder Take 0.5 Containers by mouth daily as needed for mild constipation or moderate constipation.     potassium chloride  20 MEQ/15ML (10%) SOLN Take 15 mLs (20 mEq total) by mouth 2 (two) times daily. 473 mL 1   pregabalin  (LYRICA ) 150 MG capsule Take 150 mg by mouth 3 (three) times daily as needed (Pain).     prochlorperazine  (COMPAZINE ) 10 MG tablet Take 1 tablet (10 mg total) by mouth every 6 (six) hours as  needed for nausea or vomiting. 40 tablet 3   rivaroxaban  (XARELTO ) 20 MG TABS tablet Take 1 tablet (20 mg total) by mouth daily with supper. 90 tablet 1   rosuvastatin  (CRESTOR ) 5 MG tablet TAKE 1 TABLET BY MOUTH ONCE  DAILY (IN PLACE OF ATORVASTATIN ) (Patient taking differently: Take 5 mg by mouth daily.) 90 tablet 0   solifenacin  (VESICARE ) 10 MG tablet Take 10 mg by mouth daily.     SUMAtriptan  (IMITREX ) 100 MG tablet Take 1 tablet (100 mg total) by mouth every 2 (two) hours as needed for migraine. May repeat in 2 hours if headache  persists or recurs. 10 tablet 2   Tenapanor  HCl (IBSRELA) 50 MG TABS Take 1 tablet by mouth daily at 12 noon.     Tiotropium Bromide  Monohydrate (SPIRIVA  RESPIMAT) 2.5 MCG/ACT AERS Inhale 2 puffs into the lungs daily. (Patient taking differently: Inhale 2 puffs into the lungs daily as needed (Wheezing/ Sob).) 12 g 1   traZODone  (DESYREL ) 300 MG tablet Take 1 tablet (300 mg total) by mouth at bedtime as needed for sleep. 90 tablet 1   No current facility-administered medications for this visit.   Facility-Administered Medications Ordered in Other Visits  Medication Dose Route Frequency Provider Last Rate Last Admin   0.9 %  sodium chloride  infusion   Intravenous Continuous Brahmanday, Govinda R, MD 10 mL/hr at 05/03/24 1006 New Bag at 05/03/24 1006   bevacizumab -awwb (MVASI ) 900 mg in sodium chloride  0.9 % 100 mL chemo infusion  10 mg/kg (Order-Specific) Intravenous Once Brahmanday, Govinda R, MD       heparin  lock flush 100 unit/mL  250 Units Intracatheter Once PRN Brahmanday, Govinda R, MD       heparin  lock flush 100 unit/mL  500 Units Intracatheter Once PRN Brahmanday, Govinda R, MD       ondansetron  (ZOFRAN ) 4 MG/2ML injection            PACLitaxel  (TAXOL ) 168 mg in sodium chloride  0.9 % 250 mL chemo infusion (</= 80mg /m2)  80 mg/m2 (Treatment Plan Recorded) Intravenous Once Brahmanday, Govinda R, MD       sodium chloride  flush (NS) 0.9 % injection 10 mL  10 mL  Intravenous Once Brahmanday, Govinda R, MD        VITAL SIGNS: LMP 06/19/2017  There were no vitals filed for this visit.  Estimated body mass index is 34.33 kg/m as calculated from the following:   Height as of an earlier encounter on 05/03/24: 5\' 3"  (1.6 m).   Weight as of an earlier encounter on 05/03/24: 193 lb 12.8 oz (87.9 kg).  LABS: CBC:    Component Value Date/Time   WBC 3.5 (L) 05/03/2024 0852   WBC 7.1 04/11/2024 0033   HGB 11.2 (L) 05/03/2024 0852   HCT 34.5 (L) 05/03/2024 0852   PLT 212 05/03/2024 0852   MCV 94.0 05/03/2024 0852   NEUTROABS 1.7 05/03/2024 0852   LYMPHSABS 1.4 05/03/2024 0852   MONOABS 0.3 05/03/2024 0852   EOSABS 0.1 05/03/2024 0852   BASOSABS 0.1 05/03/2024 0852   Comprehensive Metabolic Panel:    Component Value Date/Time   NA 135 05/03/2024 0852   K 3.2 (L) 05/03/2024 0852   CL 101 05/03/2024 0852   CO2 23 05/03/2024 0852   BUN 8 05/03/2024 0852   CREATININE 0.82 05/03/2024 0852   CREATININE 0.79 10/25/2019 0000   GLUCOSE 116 (H) 05/03/2024 0852   CALCIUM  8.6 (L) 05/03/2024 0852   AST 19 05/03/2024 0852   ALT 10 05/03/2024 0852   ALKPHOS 85 05/03/2024 0852   BILITOT 0.7 05/03/2024 0852   PROT 6.6 05/03/2024 0852   ALBUMIN  3.1 (L) 05/03/2024 0852    RADIOGRAPHIC STUDIES: US  Paracentesis Result Date: 04/18/2024 INDICATION: Patient with history of stage IV ovarian cancer, peritoneal carcinomatosis, recurrent malignant ascites. Request received for therapeutic paracentesis. EXAM: ULTRASOUND GUIDED THERAPEUTIC PARACENTESIS MEDICATIONS: 9 mL 1% lidocaine  COMPLICATIONS: None immediate. PROCEDURE: Informed written consent was obtained from the patient after a discussion of the risks, benefits and alternatives to treatment. A timeout was performed prior to the initiation of the procedure. Initial ultrasound scanning demonstrates  a moderate amount of ascites within the left lower abdominal quadrant. The left lower abdomen was prepped and draped in  the usual sterile fashion. 1% lidocaine  was used for local anesthesia. Following this, a 5 Fr, 7 cm OneStep centesis catheter was introduced. An ultrasound image was saved for documentation purposes. The paracentesis was performed. The catheter was removed and a dressing was applied. The patient tolerated the procedure well without immediate post procedural complication. FINDINGS: A total of approximately 3.7 liters of yellow fluid was removed. IMPRESSION: Successful ultrasound-guided paracentesis yielding 3.7 liters of peritoneal fluid. Performed by Terressa Fess, NP Electronically Signed   By: Elene Griffes M.D.   On: 04/18/2024 18:00   US  Paracentesis Result Date: 04/11/2024 INDICATION: Patient with history of stage IV ovarian cancer, peritoneal carcinomatosis, recurrent malignant ascites. Request received for diagnostic and therapeutic paracentesis. EXAM: ULTRASOUND GUIDED DIAGNOSTIC AND THERAPEUTIC PARACENTESIS MEDICATIONS: 8 ml 1% lidocaine  COMPLICATIONS: None immediate. PROCEDURE: Informed written consent was obtained from the patient after a discussion of the risks, benefits and alternatives to treatment. A timeout was performed prior to the initiation of the procedure. Initial ultrasound scanning demonstrates a small amount of ascites within the left lower abdominal quadrant. The left lower abdomen was prepped and draped in the usual sterile fashion. 1% lidocaine  was used for local anesthesia. Following this, a 19 gauge, 10-cm, Yueh catheter was introduced. An ultrasound image was saved for documentation purposes. The paracentesis was performed. The catheter was removed and a dressing was applied. The patient tolerated the procedure well without immediate post procedural complication. FINDINGS: A total of approximately 1.5 liters of hazy, amber fluid was removed. Samples were sent to the laboratory as requested by the clinical team. IMPRESSION: Successful ultrasound-guided diagnostic and therapeutic  paracentesis yielding 1.5 liters of peritoneal fluid. Performed by: Wash Hack Electronically Signed   By: Nicoletta Barrier M.D.   On: 04/11/2024 17:29   US  Paracentesis Result Date: 04/05/2024 INDICATION: Patient with stage IV ovarian cancer and peritoneal carcinomatosis with malignant ascites presenting for paracentesis today. Known to IR from paracentesis with 3.4 L out on 01/03/20. EXAM: ULTRASOUND GUIDED THERAPEUTIC PARACENTESIS MEDICATIONS: 15 mL 1% lidocaine  COMPLICATIONS: None immediate. PROCEDURE: Informed written consent was obtained from the patient after a discussion of the risks, benefits and alternatives to treatment. A timeout was performed prior to the initiation of the procedure. Initial ultrasound scanning demonstrates a moderate amount of ascites within the left lower abdominal quadrant. The left lower abdomen was prepped and draped in the usual sterile fashion. 1% lidocaine  was used for local anesthesia. Following this, a 19 gauge, 7-cm, Yueh catheter was introduced. An ultrasound image was saved for documentation purposes. The paracentesis was performed. The catheter was removed and a dressing was applied. The patient tolerated the procedure well without immediate post procedural complication. FINDINGS: A total of approximately 2.6 liters of clear, dark amber fluid was removed. IMPRESSION: Successful ultrasound-guided paracentesis yielding 2.6 liters of peritoneal fluid. Performed by Terressa Fess, NP Electronically Signed   By: Fernando Hoyer M.D.   On: 04/05/2024 16:05    PERFORMANCE STATUS (ECOG) : 1 - Symptomatic but completely ambulatory  Review of Systems Unless otherwise noted, a complete review of systems is negative.  Physical Exam General: NAD Pulmonary: Unlabored. Extremities: no edema, no joint deformities Skin: no rashes Neurological: Weakness but otherwise nonfocal  IMPRESSION: Follow-up visit.  Patient seen in infusion.  Patient states that she had  nausea on transdermal fentanyl  75 mcg patches.  Removed the patch and  felt that the nausea improved.  She felt like she tolerated the 50 mcg patches better.  Will rotate back to that previous dose.  Patient has been utilizing frequent hydromorphone  but states that pain is overall tolerable.   States that constipation improved with Movantik .  However, has gone several days since last bowel movement.  She does have flatus.  Discussed CODE STATUS.  Patient states that she would not want to be resuscitated nor have her life prolonged artificially machines.  She would prefer DNR/DNI.   I completed a MOST form today. The patient outlined their wishes for the following treatment decisions:  Cardiopulmonary Resuscitation: Do Not Attempt Resuscitation (DNR/No CPR)  Medical Interventions: Limited Additional Interventions: Use medical treatment, IV fluids and cardiac monitoring as indicated, DO NOT USE intubation or mechanical ventilation. May consider use of less invasive airway support such as BiPAP or CPAP. Also provide comfort measures. Transfer to the hospital if indicated. Avoid intensive care.   Antibiotics: Antibiotics if indicated  IV Fluids: IV fluids if indicated  Feeding Tube: No feeding tube    Patient states that she thinks she wants her sister to be her healthcare power of attorney but has not completed advanced directives.  Will refer to ACP clinic.  PLAN: -Continue current scope of treatment -Reduce fentanyl  50 mcg every 72 hours #10 -Continue oral hydromorphone  as needed for breakthrough pain -Daily bowel regimen/Movantik  -DNR/DNI -MOST Form completed -Referral to social work/ACP clinic -Follow-up telephone visit 2 weeks   Patient expressed understanding and was in agreement with this plan. She also understands that She can call the clinic at any time with any questions, concerns, or complaints.     Time Total: 15 minutes  Visit consisted of counseling and education dealing  with the complex and emotionally intense issues of symptom management and palliative care in the setting of serious and potentially life-threatening illness.Greater than 50%  of this time was spent counseling and coordinating care related to the above assessment and plan.  Signed by: Gerilyn Kobus, PhD, NP-C

## 2024-05-06 ENCOUNTER — Telehealth: Admitting: Hospice and Palliative Medicine

## 2024-05-07 ENCOUNTER — Encounter: Payer: Self-pay | Admitting: Internal Medicine

## 2024-05-07 ENCOUNTER — Other Ambulatory Visit: Payer: Self-pay | Admitting: *Deleted

## 2024-05-07 MED ORDER — RIVAROXABAN 20 MG PO TABS: 20.0000 mg | ORAL_TABLET | Freq: Every day | ORAL | 1 refills | Status: DC

## 2024-05-08 ENCOUNTER — Telehealth: Payer: Self-pay

## 2024-05-08 ENCOUNTER — Telehealth: Payer: 59 | Admitting: Hospice and Palliative Medicine

## 2024-05-08 NOTE — Telephone Encounter (Signed)
 Clinical Social Work was referred by medical provider for assessment of psychosocial needs and ACP.  CSW attempted to contact patient by phone.  Left voicemail with contact information and request for return call.

## 2024-05-09 ENCOUNTER — Other Ambulatory Visit: Payer: Self-pay

## 2024-05-10 ENCOUNTER — Encounter: Payer: Self-pay | Admitting: Nurse Practitioner

## 2024-05-10 ENCOUNTER — Inpatient Hospital Stay

## 2024-05-10 ENCOUNTER — Inpatient Hospital Stay (HOSPITAL_BASED_OUTPATIENT_CLINIC_OR_DEPARTMENT_OTHER): Admitting: Nurse Practitioner

## 2024-05-10 VITALS — BP 84/58 | HR 53

## 2024-05-10 VITALS — BP 99/73 | HR 62 | Temp 97.6°F | Resp 18 | Ht 63.0 in | Wt 197.0 lb

## 2024-05-10 DIAGNOSIS — C482 Malignant neoplasm of peritoneum, unspecified: Secondary | ICD-10-CM | POA: Diagnosis not present

## 2024-05-10 DIAGNOSIS — Z5111 Encounter for antineoplastic chemotherapy: Secondary | ICD-10-CM

## 2024-05-10 DIAGNOSIS — Z7189 Other specified counseling: Secondary | ICD-10-CM

## 2024-05-10 DIAGNOSIS — Z5112 Encounter for antineoplastic immunotherapy: Secondary | ICD-10-CM | POA: Diagnosis not present

## 2024-05-10 LAB — CMP (CANCER CENTER ONLY)
ALT: 13 U/L (ref 0–44)
AST: 19 U/L (ref 15–41)
Albumin: 3.1 g/dL — ABNORMAL LOW (ref 3.5–5.0)
Alkaline Phosphatase: 85 U/L (ref 38–126)
Anion gap: 7 (ref 5–15)
BUN: 7 mg/dL (ref 6–20)
CO2: 27 mmol/L (ref 22–32)
Calcium: 8.3 mg/dL — ABNORMAL LOW (ref 8.9–10.3)
Chloride: 104 mmol/L (ref 98–111)
Creatinine: 0.78 mg/dL (ref 0.44–1.00)
GFR, Estimated: >60 mL/min (ref >60)
Glucose, Bld: 115 mg/dL — ABNORMAL HIGH (ref 70–99)
Potassium: 3.4 mmol/L — ABNORMAL LOW (ref 3.5–5.1)
Sodium: 138 mmol/L (ref 135–145)
Total Bilirubin: 0.4 mg/dL (ref 0.0–1.2)
Total Protein: 6.3 g/dL — ABNORMAL LOW (ref 6.5–8.1)

## 2024-05-10 LAB — CBC WITH DIFFERENTIAL (CANCER CENTER ONLY)
Abs Immature Granulocytes: 0.07 10*3/uL (ref 0.00–0.07)
Basophils Absolute: 0.1 10*3/uL (ref 0.0–0.1)
Basophils Relative: 2 %
Eosinophils Absolute: 0.1 10*3/uL (ref 0.0–0.5)
Eosinophils Relative: 4 %
HCT: 35 % — ABNORMAL LOW (ref 36.0–46.0)
Hemoglobin: 11.1 g/dL — ABNORMAL LOW (ref 12.0–15.0)
Immature Granulocytes: 2 %
Lymphocytes Relative: 47 %
Lymphs Abs: 1.8 10*3/uL (ref 0.7–4.0)
MCH: 30.5 pg (ref 26.0–34.0)
MCHC: 31.7 g/dL (ref 30.0–36.0)
MCV: 96.2 fL (ref 80.0–100.0)
Monocytes Absolute: 0.2 10*3/uL (ref 0.1–1.0)
Monocytes Relative: 6 %
Neutro Abs: 1.4 10*3/uL — ABNORMAL LOW (ref 1.7–7.7)
Neutrophils Relative %: 39 %
Platelet Count: 181 10*3/uL (ref 150–400)
RBC: 3.64 MIL/uL — ABNORMAL LOW (ref 3.87–5.11)
RDW: 16.7 % — ABNORMAL HIGH (ref 11.5–15.5)
WBC Count: 3.6 10*3/uL — ABNORMAL LOW (ref 4.0–10.5)
nRBC: 0 % (ref 0.0–0.2)

## 2024-05-10 LAB — PROTEIN, URINE, RANDOM: Total Protein, Urine: 28 mg/dL

## 2024-05-10 MED ORDER — PACLITAXEL CHEMO INJECTION 300 MG/50ML
80.0000 mg/m2 | Freq: Once | INTRAVENOUS | Status: AC
  Administered 2024-05-10: 168 mg via INTRAVENOUS
  Filled 2024-05-10: qty 28

## 2024-05-10 MED ORDER — FAMOTIDINE IN NACL 20-0.9 MG/50ML-% IV SOLN
20.0000 mg | Freq: Once | INTRAVENOUS | Status: AC
  Administered 2024-05-10: 20 mg via INTRAVENOUS

## 2024-05-10 MED ORDER — SODIUM CHLORIDE 0.9 % IV SOLN
INTRAVENOUS | Status: DC
  Filled 2024-05-10 (×2): qty 250

## 2024-05-10 MED ORDER — DEXAMETHASONE SODIUM PHOSPHATE 10 MG/ML IJ SOLN
10.0000 mg | Freq: Once | INTRAMUSCULAR | Status: AC
  Administered 2024-05-10: 10 mg via INTRAVENOUS
  Filled 2024-05-10: qty 1

## 2024-05-10 MED ORDER — HEPARIN SOD (PORK) LOCK FLUSH 100 UNIT/ML IV SOLN
500.0000 [IU] | Freq: Once | INTRAVENOUS | Status: AC | PRN
  Administered 2024-05-10: 500 [IU]
  Filled 2024-05-10: qty 5

## 2024-05-10 MED ORDER — DIPHENHYDRAMINE HCL 50 MG/ML IJ SOLN
50.0000 mg | Freq: Once | INTRAMUSCULAR | Status: AC
  Administered 2024-05-10: 50 mg via INTRAVENOUS
  Filled 2024-05-10: qty 1

## 2024-05-10 NOTE — Progress Notes (Signed)
 A lot of nausea, no vomiting. Constipation is better. No appetite.. Pain in and is a 4/10. Not requiring paracentesis at the moment. Legs are weaker, wobbly. Has not fallen . Occ dizziness with standing up.Fatigued all the time.

## 2024-05-10 NOTE — Patient Instructions (Signed)

## 2024-05-10 NOTE — Progress Notes (Signed)
 Treatment completed without incident. Patient slept through most of treatment. Patient's BP was low at discharge, but felt fine. OK to discharge per Kenney Peacemaker, NP.
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
## 2024-05-11 LAB — CA 125: Cancer Antigen (CA) 125: 3609 U/mL — ABNORMAL HIGH (ref 0.0–38.1)

## 2024-05-17 ENCOUNTER — Ambulatory Visit
Admission: RE | Admit: 2024-05-17 | Discharge: 2024-05-17 | Disposition: A | Discharge status: Home / Self Care | Source: Ambulatory Visit | Attending: Internal Medicine | Admitting: Internal Medicine

## 2024-05-17 ENCOUNTER — Inpatient Hospital Stay (HOSPITAL_BASED_OUTPATIENT_CLINIC_OR_DEPARTMENT_OTHER): Admitting: Hospice and Palliative Medicine

## 2024-05-17 ENCOUNTER — Other Ambulatory Visit: Payer: Self-pay | Admitting: Internal Medicine

## 2024-05-17 ENCOUNTER — Inpatient Hospital Stay: Admitting: Hospice and Palliative Medicine

## 2024-05-17 ENCOUNTER — Inpatient Hospital Stay

## 2024-05-17 ENCOUNTER — Encounter: Payer: Self-pay | Admitting: Internal Medicine

## 2024-05-17 ENCOUNTER — Inpatient Hospital Stay (HOSPITAL_BASED_OUTPATIENT_CLINIC_OR_DEPARTMENT_OTHER): Admitting: Nurse Practitioner

## 2024-05-17 VITALS — BP 119/90 | HR 74 | Temp 96.0°F | Resp 17 | Wt 189.0 lb

## 2024-05-17 DIAGNOSIS — C482 Malignant neoplasm of peritoneum, unspecified: Secondary | ICD-10-CM

## 2024-05-17 DIAGNOSIS — T451X5A Adverse effect of antineoplastic and immunosuppressive drugs, initial encounter: Secondary | ICD-10-CM

## 2024-05-17 DIAGNOSIS — Z7189 Other specified counseling: Secondary | ICD-10-CM

## 2024-05-17 DIAGNOSIS — R112 Nausea with vomiting, unspecified: Secondary | ICD-10-CM | POA: Diagnosis not present

## 2024-05-17 DIAGNOSIS — Z515 Encounter for palliative care: Secondary | ICD-10-CM

## 2024-05-17 DIAGNOSIS — Z5112 Encounter for antineoplastic immunotherapy: Secondary | ICD-10-CM | POA: Diagnosis not present

## 2024-05-17 LAB — CMP (CANCER CENTER ONLY)
ALT: 12 U/L (ref 0–44)
AST: 21 U/L (ref 15–41)
Albumin: 3.8 g/dL (ref 3.5–5.0)
Alkaline Phosphatase: 87 U/L (ref 38–126)
Anion gap: 13 (ref 5–15)
BUN: 7 mg/dL (ref 6–20)
CO2: 21 mmol/L — ABNORMAL LOW (ref 22–32)
Calcium: 8.8 mg/dL — ABNORMAL LOW (ref 8.9–10.3)
Chloride: 101 mmol/L (ref 98–111)
Creatinine: 0.58 mg/dL (ref 0.44–1.00)
GFR, Estimated: >60 mL/min (ref >60)
Glucose, Bld: 105 mg/dL — ABNORMAL HIGH (ref 70–99)
Potassium: 3.4 mmol/L — ABNORMAL LOW (ref 3.5–5.1)
Sodium: 135 mmol/L (ref 135–145)
Total Bilirubin: 0.9 mg/dL (ref 0.0–1.2)
Total Protein: 7.2 g/dL (ref 6.5–8.1)

## 2024-05-17 LAB — CBC WITH DIFFERENTIAL (CANCER CENTER ONLY)
Abs Immature Granulocytes: 0.06 10*3/uL (ref 0.00–0.07)
Basophils Absolute: 0.1 10*3/uL (ref 0.0–0.1)
Basophils Relative: 2 %
Eosinophils Absolute: 0.1 10*3/uL (ref 0.0–0.5)
Eosinophils Relative: 2 %
HCT: 35.9 % — ABNORMAL LOW (ref 36.0–46.0)
Hemoglobin: 11.8 g/dL — ABNORMAL LOW (ref 12.0–15.0)
Immature Granulocytes: 2 %
Lymphocytes Relative: 35 %
Lymphs Abs: 1.2 10*3/uL (ref 0.7–4.0)
MCH: 30.6 pg (ref 26.0–34.0)
MCHC: 32.9 g/dL (ref 30.0–36.0)
MCV: 93.2 fL (ref 80.0–100.0)
Monocytes Absolute: 0.3 10*3/uL (ref 0.1–1.0)
Monocytes Relative: 8 %
Neutro Abs: 1.8 10*3/uL (ref 1.7–7.7)
Neutrophils Relative %: 51 %
Platelet Count: 204 10*3/uL (ref 150–400)
RBC: 3.85 MIL/uL — ABNORMAL LOW (ref 3.87–5.11)
RDW: 16.8 % — ABNORMAL HIGH (ref 11.5–15.5)
WBC Count: 3.4 10*3/uL — ABNORMAL LOW (ref 4.0–10.5)
nRBC: 0 % (ref 0.0–0.2)

## 2024-05-17 MED ORDER — DEXAMETHASONE SODIUM PHOSPHATE 10 MG/ML IJ SOLN
10.0000 mg | Freq: Once | INTRAMUSCULAR | Status: AC | PRN
  Administered 2024-05-17: 10 mg via INTRAVENOUS
  Filled 2024-05-17: qty 1

## 2024-05-17 MED ORDER — PROCHLORPERAZINE EDISYLATE 10 MG/2ML IJ SOLN
10.0000 mg | Freq: Once | INTRAMUSCULAR | Status: AC | PRN
  Administered 2024-05-17: 10 mg via INTRAVENOUS
  Filled 2024-05-17: qty 2

## 2024-05-17 MED ORDER — HEPARIN SOD (PORK) LOCK FLUSH 100 UNIT/ML IV SOLN
500.0000 [IU] | Freq: Once | INTRAVENOUS | Status: AC | PRN
Start: 2024-05-17 — End: 2024-05-17
  Administered 2024-05-17: 500 [IU]
  Filled 2024-05-17: qty 5

## 2024-05-17 MED ORDER — SODIUM CHLORIDE 0.9% FLUSH
10.0000 mL | Freq: Once | INTRAVENOUS | Status: DC | PRN
  Filled 2024-05-17: qty 10

## 2024-05-17 MED ORDER — SODIUM CHLORIDE 0.9 % IV SOLN
Freq: Once | INTRAVENOUS | Status: AC
  Filled 2024-05-17: qty 250

## 2024-05-17 MED ORDER — FAMOTIDINE IN NACL 20-0.9 MG/50ML-% IV SOLN
20.0000 mg | Freq: Once | INTRAVENOUS | Status: AC
  Administered 2024-05-17: 20 mg via INTRAVENOUS
  Filled 2024-05-17: qty 50

## 2024-05-17 MED ORDER — OLANZAPINE 10 MG PO TABS: 10.0000 mg | ORAL_TABLET | Freq: Every evening | ORAL | 3 refills | Status: DC | PRN

## 2024-05-17 MED ORDER — ONDANSETRON HCL 4 MG/2ML IJ SOLN
8.0000 mg | Freq: Once | INTRAMUSCULAR | Status: AC
  Administered 2024-05-17: 8 mg via INTRAVENOUS
  Filled 2024-05-17: qty 4

## 2024-05-17 NOTE — Patient Instructions (Signed)

## 2024-05-17 NOTE — Procedures (Addendum)
 Patient presents for therapeutic paracentesis. I was present during limited US  of the abdomen to evaluate for ascites. There is minimal peritoneal fluid with numerous mobile loops of bowel. Insufficient pocket of fluid to safely perform a paracentesis.   After discussion of the risks versus the benefits of the procedure the patient elected to defer the paracentesis at this time.  Patient also admits to not feeling distended at this time. Procedure not performed.  Electronically Signed: Cashawn Yanko M Jacaria Colburn, PA-C 05/17/2024, 1:23 PM

## 2024-05-17 NOTE — Progress Notes (Signed)
 Palliative Medicine [facility] Telephone:([phone]) [phone] Fax:(336) [phone]   Name: [name] Date: 05/17/2024 MRN: [id number]  DOB: 19-May-1971  Patient Care Team: Sowles, Krichna, MD as PCP - General (Family Medicine) Constancia Delton, MD as PCP - Cardiology (Cardiology) Rochell Chroman, RN as Oncology Nurse Navigator Geraldyn Shain, Carlene Che, NP as Nurse Practitioner (Hospice and Palliative Medicine) Gwyn Leos, MD as Consulting Physician (Internal Medicine) Nobie Batch, MD as Referring Physician (Obstetrics) Marquita Situ, Magali Schmitz, MD as Consulting Physician (General Surgery) Queenie Brunet, RN as Registered Nurse (Oncology)    REASON FOR CONSULTATION: [name] is a 53 y.o. 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  Patient has also had chronic abdominal pain.  She was referred to palliative care to help address goals and manage ongoing symptoms.   SOCIAL HISTORY:     reports that she has been smoking cigarettes. She has a 30 pack-year smoking history. She has never used smokeless tobacco. She reports that she does not currently use alcohol. She reports that she does not use drugs.  Patient unmarried.  Lives at home with her daughter.  Has a sister who lives in [place].  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   Depression    Drug-induced androgenic alopecia    Dyslipidemia 05/17/2022   Dysrhythmia    Family history of breast cancer    GERD (gastroesophageal reflux disease)    Goals of care, counseling/discussion 12/16/2019   Hypertension    Insomnia    Menorrhagia    Migraines    Moderate episode of recurrent major depressive disorder (HCC) 06/17/2022   Osteoarthritis    back   Ovarian cancer (HCC) 12/10/2019   Personal history of chemotherapy    ovarian cancer   Plantar fasciitis     PAST SURGICAL HISTORY:  Past Surgical History:  Procedure Laterality Date   ABDOMINAL HYSTERECTOMY  03/2020   APPENDECTOMY     LSC but "ruptured when they did the surgery"   BREAST BIOPSY Left 01/04/2021   MRI BX   CESAREAN SECTION     CYSTOSCOPY N/A 04/08/2020   Procedure: CYSTOSCOPY;  Surgeon: Nobie Batch, MD;  [name];  Service: Gynecology;  [name];   INSERTION OF MESH N/A 04/26/2021   Procedure: INSERTION OF MESH;  Surgeon: Marshall Skeeter, MD;  [name];  Service: General;  [name];   IR THORACENTESIS ASP PLEURAL SPACE W/IMG GUIDE  12/06/2019   IUD REMOVAL N/A 04/08/2020   Procedure: INTRAUTERINE DEVICE (IUD) REMOVAL;  Surgeon: Nobie Batch, MD;  [name];  Service: Gynecology;  [name];   PARACENTESIS     x6   PORTA CATH INSERTION N/A 04/23/2020   Procedure: PORTA CATH INSERTION;  Surgeon: Celso College, MD;  Location: [facility] INVASIVE  CV LAB;  Service: Cardiovascular;  [name];   TUBAL LIGATION     at time of CSxn   VENTRAL HERNIA REPAIR N/A 04/26/2021   Procedure: HERNIA REPAIR VENTRAL ADULT;  Surgeon: Marshall Skeeter, MD;  [name];  Service: General;  [name];  need RNFA for the case   WRIST SURGERY Left 11/21/2016   plates and screws inserted     HEMATOLOGY/ONCOLOGY HISTORY:  Oncology History Overview Note  # DEC 2020- ADENO CA [s/p Pleural effusion]; [name]; [name]; abdominal ascites status post paracentesis x2; adenocarcinoma; [name]; omental caking/peritoneal disease/no obvious evidence of bowel involvement; no adnexal masses readily noted; Ca [phone].   # 12/23/2019- Carbo-Taxol  #1; Jan 18 th 2021- #2 carbo-Taxol -Bev status post 4 cycles-April 08, 2020-debulking surgery [Dr. Secord] miliary disease noted post surgery. Carbo-Taxol -Avastin  x6  # July 6th 2021- Avastin  q 3 W+ OLAPARIB  300 mg BID  # OCT 26th, 2021-recurrent anemia [hemoglobin 7.5]; HELD Olaparib   # DEC 9th 2021- olaparib  to 250 BID; [name], [name]; [name] ; HOLD AVASTIN  [last 2/11]sec to upcoming hernia repair  # June 20th, 2022 ~restart olaparib  200 mg twice daily_+ Avastin .   # Jan 15th 2021- L UE SVTxarelto; March 10th-stop Xarelto  [gum bleeding-platelets 70s/Avastin ]; April 15th 2021-started Xarelto  20 mg post surgery; mid May 2021-Xarelto  10 mg a day/prophylaxis.  # JAN 2024- Brain MRI with and without contrast-nonocclusive dural venous sinus thrombosis noted-without any brain infarct or any metastatic disease to the brain.  Lovenox / NOAK for long-term needs. HOLD avastin .  # APRIL 26th  2024#1-- Doxil -carboplatin  every 4 weeks x6 cyles- Chemo holiday- started OCT 2024.   # March 28 the CT scan-progressive peritoneal carcinomatosis.  # APRIL 4th, 2025- gemcitabine  single agent cycle #1 day 1 day 8-every 21 days- Disocntinued sec to skin rash/pt relcutance  # April 25th start Taxol -Avatsin-  # BRCA-1 [on screening; s/p genetics counseling; [name]; July 2019- 2-3cm-right complex ovarian cyst- likely benign/hemorrhagic [also 2011].  # #December 2021 screening breast MRI-left  breast 9 mm lesion biopsy; apocrine metaplasia/benign; annual MRI.    DIAGNOSIS: Primary peritoneal adenocarcinoma  STAGE:   IV      ;  GOALS: control    Primary peritoneal carcinomatosis (HCC)  12/16/2019 Initial Diagnosis   Primary peritoneal adenocarcinoma (HCC)   12/23/2019 - 07/08/2022 Chemotherapy   Patient is on Treatment Plan : Carboplatin  + Paclitaxel  + Mvasi  q21d     12/23/2019 - 12/20/2022 Chemotherapy   Patient is on Treatment Plan : OVARIAN Carboplatin  + Paclitaxel  + Bevacizumab  q21d      01/07/2021 Cancer Staging   Staging form: Ovary, Fallopian Tube, and Primary Peritoneal Carcinoma, AJCC 8th Edition - Clinical: Stage IVA (pM1a) - Signed by Gwyn Leos, MD on 01/07/2021   04/21/2023 - 10/23/2023 Chemotherapy   Patient is on Treatment Plan : OVARIAN RECURRENT Liposomal Doxorubicin  + Carboplatin  q28d X 6 Cycles     03/28/2024 - 03/28/2024 Chemotherapy   Patient is on Treatment Plan : BREAST Gemcitabine  D1,8 (800) q21d     04/19/2024 -  Chemotherapy   Patient is on Treatment Plan : OVARIAN Paclitaxel   W0,9,81,19 + Bevacizumab  D1,15 q28d        ALLERGIES:  is allergic to hydroxyzine  hcl.  MEDICATIONS:  Current Outpatient Medications  Medication Sig Dispense Refill   buPROPion  (WELLBUTRIN  XL) 300 MG 24 hr tablet Take 1 tablet (300 mg  total) by mouth daily. 90 tablet 1   cariprazine  (VRAYLAR ) 1.5 MG capsule Take 1 capsule (1.5 mg total) by mouth daily. 90 capsule 1   cloNIDine  (CATAPRES  - DOSED IN MG/24 HR) 0.3 mg/24hr patch Place 1 patch (0.3 mg total) onto the skin once a week. Friday 12 patch 3   cloNIDine  (CATAPRES ) 0.1 MG tablet Take 1 tablet (0.1 mg total) by mouth at bedtime. 90 tablet 1   diphenhydrAMINE  (BENADRYL ) 25 mg capsule Take 25 mg by mouth every 6 (six) hours as needed for allergies or itching.     docusate sodium  (COLACE) 100 MG capsule Take 100 mg by mouth daily as needed for moderate constipation or mild constipation.     DULoxetine  (CYMBALTA )  60 MG capsule TAKE 1 CAPSULE BY MOUTH DAILY 90 capsule 0   fentaNYL  (DURAGESIC ) 50 MCG/HR Place 1 patch onto the skin every 3 (three) days. 10 patch 0   HYDROmorphone  (DILAUDID ) 4 MG tablet Take 1 tablet (4 mg total) by mouth every 4 (four) hours as needed for severe pain (pain score 7-10). 60 tablet 0   lactobacillus acidophilus (BACID) TABS tablet Take 2 tablets by mouth daily at 12 noon.     Multiple Vitamin (MULTIVITAMIN WITH MINERALS) TABS tablet Take 1 tablet by mouth daily.     naloxegol  oxalate (MOVANTIK ) 25 MG TABS tablet Take 1 tablet (25 mg total) by mouth daily. 30 tablet 2   nystatin  (MYCOSTATIN /NYSTOP ) powder Apply 1 Application topically 3 (three) times daily. 60 g 0   omeprazole  (PRILOSEC) 20 MG capsule TAKE 1 CAPSULE BY MOUTH DAILY 90 capsule 0   ondansetron  (ZOFRAN ) 8 MG tablet One pill every 8 hours as needed for nausea/vomitting. 90 tablet 1   polyethylene glycol powder (GLYCOLAX /MIRALAX ) 17 GM/SCOOP powder Take 0.5 Containers by mouth daily as needed for mild constipation or moderate constipation.     potassium chloride  20 MEQ/15ML (10%) SOLN Take 15 mLs (20 mEq total) by mouth 2 (two) times daily. 473 mL 1   pregabalin  (LYRICA ) 150 MG capsule Take 150 mg by mouth 3 (three) times daily as needed (Pain).     prochlorperazine  (COMPAZINE ) 10 MG tablet Take 1 tablet (10 mg total) by mouth every 6 (six) hours as needed for nausea or vomiting. 40 tablet 3   rivaroxaban  (XARELTO ) 20 MG TABS tablet Take 1 tablet (20 mg total) by mouth daily with supper. 90 tablet 1   rosuvastatin  (CRESTOR ) 5 MG tablet TAKE 1 TABLET BY MOUTH ONCE  DAILY (IN PLACE OF ATORVASTATIN ) (Patient taking differently: Take 5 mg by mouth daily.) 90 tablet 0   solifenacin  (VESICARE ) 10 MG tablet Take 10 mg by mouth daily.     SUMAtriptan  (IMITREX ) 100 MG tablet Take 1 tablet (100 mg total) by mouth every 2 (two) hours as needed for migraine. May repeat in 2 hours if headache persists or recurs. 10 tablet 2    Tiotropium Bromide  Monohydrate (SPIRIVA  RESPIMAT) 2.5 MCG/ACT AERS Inhale 2 puffs into the lungs daily. (Patient taking differently: Inhale 2 puffs into the lungs daily as needed (Wheezing/ Sob).) 12 g 1   traZODone  (DESYREL ) 300 MG tablet Take 1 tablet (300 mg total) by mouth at bedtime as needed for sleep. 90 tablet 1   No current facility-administered medications for this visit.   Facility-Administered Medications Ordered in Other Visits  Medication Dose Route Frequency Provider Last Rate Last Admin   heparin  lock flush 100 unit/mL  250 Units Intracatheter Once PRN Brahmanday, Govinda R, MD  heparin  lock flush 100 unit/mL  500 Units Intracatheter Once PRN Brahmanday, Govinda R, MD       ondansetron  (ZOFRAN ) 4 MG/2ML injection            sodium chloride  flush (NS) 0.9 % injection 10 mL  10 mL Intravenous Once Brahmanday, Govinda R, MD       sodium chloride  flush (NS) 0.9 % injection 10 mL  10 mL Intracatheter Once PRN Brahmanday, Govinda R, MD        VITAL SIGNS: LMP 06/19/2017  There were no vitals filed for this visit.  Estimated body mass index is 33.48 kg/m as calculated from the following:   Height as of 05/10/24: 5\' 3"  (1.6 m).   Weight as of an earlier encounter on 05/17/24: 189 lb (85.7 kg).  LABS: CBC:    Component Value Date/Time   WBC 3.4 (L) 05/17/2024 0857   WBC 7.1 04/11/2024 0033   HGB 11.8 (L) 05/17/2024 0857   HCT 35.9 (L) 05/17/2024 0857   PLT 204 05/17/2024 0857   MCV 93.2 05/17/2024 0857   NEUTROABS 1.8 05/17/2024 0857   LYMPHSABS 1.2 05/17/2024 0857   MONOABS 0.3 05/17/2024 0857   EOSABS 0.1 05/17/2024 0857   BASOSABS 0.1 05/17/2024 0857   Comprehensive Metabolic Panel:    Component Value Date/Time   NA 135 05/17/2024 0857   K 3.4 (L) 05/17/2024 0857   CL 101 05/17/2024 0857   CO2 21 (L) 05/17/2024 0857   BUN 7 05/17/2024 0857   CREATININE 0.58 05/17/2024 0857   CREATININE 0.79 10/25/2019 0000   GLUCOSE 105 (H) 05/17/2024 0857   CALCIUM  8.8  (L) 05/17/2024 0857   AST 21 05/17/2024 0857   ALT 12 05/17/2024 0857   ALKPHOS 87 05/17/2024 0857   BILITOT 0.9 05/17/2024 0857   PROT 7.2 05/17/2024 0857   ALBUMIN  3.8 05/17/2024 0857    RADIOGRAPHIC STUDIES: US  Paracentesis Result Date: 04/18/2024 INDICATION: Patient with history of stage IV ovarian cancer, peritoneal carcinomatosis, recurrent malignant ascites. Request received for therapeutic paracentesis. EXAM: ULTRASOUND GUIDED THERAPEUTIC PARACENTESIS MEDICATIONS: 9 mL 1% lidocaine  COMPLICATIONS: None immediate. PROCEDURE: Informed written consent was obtained from the patient after a discussion of the risks, benefits and alternatives to treatment. A timeout was performed prior to the initiation of the procedure. Initial ultrasound scanning demonstrates a moderate amount of ascites within the left lower abdominal quadrant. The left lower abdomen was prepped and draped in the usual sterile fashion. 1% lidocaine  was used for local anesthesia. Following this, a 5 Fr, 7 cm OneStep centesis catheter was introduced. An ultrasound image was saved for documentation purposes. The paracentesis was performed. The catheter was removed and a dressing was applied. The patient tolerated the procedure well without immediate post procedural complication. FINDINGS: A total of approximately 3.7 liters of yellow fluid was removed. IMPRESSION: Successful ultrasound-guided paracentesis yielding 3.7 liters of peritoneal fluid. Performed by Terressa Fess, NP Electronically Signed   By: Elene Griffes M.D.   On: 04/18/2024 18:00    PERFORMANCE STATUS (ECOG) : 1 - Symptomatic but completely ambulatory  Review of Systems Unless otherwise noted, a complete review of systems is negative.  Physical Exam General: NAD Pulmonary: Unlabored. Extremities: no edema, no joint deformities Skin: no rashes Neurological: Weakness but otherwise nonfocal  IMPRESSION: Follow-up visit.  Patient seen in infusion.  Patient  reports significant nausea since last chemotherapy.  She had several days of vomiting but that has resolved.  Oral intake is poor.  Patient is having  bowel movements, albeit infrequently.  Denies abdominal distention.  She was sent today for abdominal ultrasound.  No paracentesis indicated.  Patient says that she has been taking ondansetron  and prochlorperazine  but has not found either to be particularly helpful.  Will DC prochlorperazine  and start on bedtime olanzapine.  Continue scheduled ondansetron .  Patient self discontinued fentanyl  as she thought that could be contributing to nausea.  However, I suspect that nausea is secondary to chemotherapy.  Would recommend restarting transdermal fentanyl  to lessen utilization of oral hydromorphone .  PLAN: -Continue current scope of treatment - Continue fentanyl /hydromorphone  -Daily bowel regimen/Movantik  -Start olanzapine 10 mg nightly as needed for nausea -Continue as needed ondansetron  -DNR/DNI -Follow-up 1 week   Patient expressed understanding and was in agreement with this plan. She also understands that She can call the clinic at any time with any questions, concerns, or complaints.     Time Total: 15 minutes  Visit consisted of counseling and education dealing with the complex and emotionally intense issues of symptom management and palliative care in the setting of serious and potentially life-threatening illness.Greater than 50%  of this time was spent counseling and coordinating care related to the above assessment and plan.  Signed by: Gerilyn Kobus, PhD, NP-C

## 2024-05-17 NOTE — Addendum Note (Signed)
 Addended by: Peggyann Bower on: 05/17/2024 05:52 PM   Modules accepted: Orders
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
## 2024-05-22 ENCOUNTER — Telehealth: Admitting: Hospice and Palliative Medicine

## 2024-05-24 ENCOUNTER — Inpatient Hospital Stay: Admitting: Internal Medicine

## 2024-05-24 ENCOUNTER — Inpatient Hospital Stay

## 2024-05-24 ENCOUNTER — Inpatient Hospital Stay: Admitting: Hospice and Palliative Medicine

## 2024-05-24 NOTE — Assessment & Plan Note (Deleted)
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    # CT scan March 22, 2024-Diffuse peritoneal carcinomatosis with moderate abdominal and pelvic free fluid and nodularity demonstrated throughout the peritoneum, omentum, along the hemidiaphragms, and in the pelvis.  Ventral abdominal wall hernia containing small bowel without evidence of obstruction.  No metastatic disease demonstrated in the chest.  Folate alpha IHC testing=5%.  Also discussed with gynecology oncology.  10/03/2023- Left ventricular ejection fraction, by estimation, is 55 to 60%.   # Proceed with paclitaxel  Avastin - Cycle # 1 day-15. Labs-CBC/chemistries were reviewed with the patient.  Discussed with patient that unfortunately her disease is terminal.  Options are very limited. Stable.  # ascites- malignant- HOLD today-  para today-   # constipation- miralax  prn/ movantik - Stable.   # Hypocalcemia-pending-Vit D levels; Hypokalemia- severe -3.3  -potassium.  Continue  K-Dur twice daily; Continue slow Mag. Stable.   # Ongoing MSK joints/abdominal pain--secondary malignancy malignancy.  On  Fentanyl  patch; and dilaudid  Dr. 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 No new or acute finding.  Currently on xarelto -  Stable.     # PN G-2/back pain- Cymbalta - Stable.    # Depression/ Anxiety/Insomnia-status post evaluation with February 2025 psychiatry/cerula care.  Continue Cymbalta  [at 60 mg/day]; continue trazadone    300 mg at bedtime; vraylar -/wellbutrin   s/p cerula care. Stable.    # Hot flashes: on Clonidine  patch- on Friday weekly; and clonidine  pill 0.1 qhs.Stable.    # Constipation: sec to narcotics- sec to narcotics- improved on opioid antagonist-  movantik   # ACP: Discussed advance care planning-patient for now wants to continue full code.   #IV access/Mediport-currently s/p TPA- port flush.  Stable.   # Debility: ok with wheel chair.   Weekly Taxol ; q 2 w-Bev  PS # DISPOSITION:  # order random urine protein/every 2 weeks- #  ok with wheel chair.  # chemo today-ok without UA # 1 week- APP / port- labs- cbc/cmp; chemo # 2 week-MD port-labs- cbc/cmp # 3 weeks-MD  port-labs- cbc/cmp; chemo  # 4 weeks-MD;  port-labs- cbc/cmp; chemo  Dr.B

## 2024-05-27 ENCOUNTER — Inpatient Hospital Stay: Attending: Internal Medicine | Admitting: Hospice and Palliative Medicine

## 2024-05-27 ENCOUNTER — Encounter: Payer: Self-pay | Admitting: Hospice and Palliative Medicine

## 2024-05-27 ENCOUNTER — Encounter: Payer: Self-pay | Admitting: Internal Medicine

## 2024-05-27 ENCOUNTER — Telehealth: Payer: Self-pay | Admitting: *Deleted

## 2024-05-27 DIAGNOSIS — G893 Neoplasm related pain (acute) (chronic): Secondary | ICD-10-CM

## 2024-05-27 DIAGNOSIS — Z79899 Other long term (current) drug therapy: Secondary | ICD-10-CM | POA: Insufficient documentation

## 2024-05-27 DIAGNOSIS — Z5112 Encounter for antineoplastic immunotherapy: Secondary | ICD-10-CM | POA: Insufficient documentation

## 2024-05-27 DIAGNOSIS — Z5111 Encounter for antineoplastic chemotherapy: Secondary | ICD-10-CM | POA: Insufficient documentation

## 2024-05-27 DIAGNOSIS — C482 Malignant neoplasm of peritoneum, unspecified: Secondary | ICD-10-CM | POA: Diagnosis not present

## 2024-05-27 MED ORDER — HYDROMORPHONE HCL 4 MG PO TABS: 4.0000 mg | ORAL_TABLET | ORAL | 0 refills | Status: DC | PRN

## 2024-05-27 MED ORDER — FENTANYL 50 MCG/HR TD PT72: 1.0000 | MEDICATED_PATCH | TRANSDERMAL | 0 refills | Status: DC

## 2024-05-27 NOTE — Telephone Encounter (Signed)
 Patient sent mychart msg to request a call back from [place], NP. I returned pt's call. She stated that she missed the appointment last week with Jerrilyn Moras, NP. She wants to talk to [facility] about goals of care. She doesn't feel like she can tolerate chemotherapy as planned. Next treatment is on Friday this week. She would like to see if Dr. Valentine Gasmen has any different treatment options that she can have instead of the current treatment.  Phone call apt schedule with Josh today. Josh aware

## 2024-05-27 NOTE — Progress Notes (Signed)
 Virtual Visit via Telephone Note  I connected with CAMERA KRIENKE on 05/27/24 at  1:20 PM EDT by telephone and verified that I am speaking with the correct person using two identifiers.  Location: Patient: Home Provider: Clinic   I discussed the limitations, risks, security and privacy concerns of performing an evaluation and management service by telephone and the availability of in person appointments. I also discussed with the patient that there may be a patient responsible charge related to this service. The patient expressed understanding and agreed to proceed.   History of Present Illness: Ms. 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  Patient has also had chronic abdominal pain.  She was referred to palliative care to help address goals and manage ongoing symptoms.   Observations/Objective: Follow-up telephone visit to address pain management.  Patient feels pain is better but significant n/v, poor oral intake, and fatigue. Feels like she is doing poorly with chemotherapy. She feels that treatment is negatively impacting her QOL. Would like to discuss alternative regimens with Dr. Valentine Gasmen when she returns to clinic later this week.   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  Patient sees Dr. Valentine Gasmen later this week   Neoplasm related pain-improved. Refill fentanyl /hydromorphone .   Opioid-induced constipation -continue daily bowel regimen  Case and plan discussed with Dr. Valentine Gasmen  Follow Up Instructions: Follow-up 3-4  weeks   I discussed the assessment and treatment plan with the patient. The patient was provided an opportunity to ask questions and all were answered. The patient agreed with the plan and demonstrated an understanding of the instructions.   The patient was advised to call back or seek an in-person evaluation if the symptoms worsen or if the condition fails to improve as anticipated.  I provided 10 minutes of non-face-to-face time during this encounter.   Peggyann Bower, NP

## 2024-05-28 ENCOUNTER — Encounter: Payer: Self-pay | Admitting: Internal Medicine

## 2024-05-31 ENCOUNTER — Inpatient Hospital Stay (HOSPITAL_BASED_OUTPATIENT_CLINIC_OR_DEPARTMENT_OTHER): Admitting: Internal Medicine

## 2024-05-31 ENCOUNTER — Other Ambulatory Visit: Payer: Self-pay | Admitting: Internal Medicine

## 2024-05-31 ENCOUNTER — Inpatient Hospital Stay

## 2024-05-31 ENCOUNTER — Other Ambulatory Visit: Payer: Self-pay

## 2024-05-31 ENCOUNTER — Encounter: Payer: Self-pay | Admitting: Internal Medicine

## 2024-05-31 VITALS — BP 110/79 | HR 73 | Temp 97.7°F | Resp 18

## 2024-05-31 VITALS — BP 119/91 | HR 85 | Temp 95.7°F | Resp 16 | Ht 63.0 in | Wt 189.9 lb

## 2024-05-31 DIAGNOSIS — Z7189 Other specified counseling: Secondary | ICD-10-CM

## 2024-05-31 DIAGNOSIS — C482 Malignant neoplasm of peritoneum, unspecified: Secondary | ICD-10-CM

## 2024-05-31 DIAGNOSIS — Z5112 Encounter for antineoplastic immunotherapy: Secondary | ICD-10-CM | POA: Diagnosis not present

## 2024-05-31 DIAGNOSIS — Z5111 Encounter for antineoplastic chemotherapy: Secondary | ICD-10-CM | POA: Diagnosis present

## 2024-05-31 DIAGNOSIS — Z79899 Other long term (current) drug therapy: Secondary | ICD-10-CM | POA: Diagnosis not present

## 2024-05-31 LAB — CBC WITH DIFFERENTIAL (CANCER CENTER ONLY)
Abs Immature Granulocytes: 0.03 10*3/uL (ref 0.00–0.07)
Basophils Absolute: 0.1 10*3/uL (ref 0.0–0.1)
Basophils Relative: 1 %
Eosinophils Absolute: 0.1 10*3/uL (ref 0.0–0.5)
Eosinophils Relative: 2 %
HCT: 38.6 % (ref 36.0–46.0)
Hemoglobin: 12 g/dL (ref 12.0–15.0)
Immature Granulocytes: 1 %
Lymphocytes Relative: 30 %
Lymphs Abs: 1.3 10*3/uL (ref 0.7–4.0)
MCH: 30.5 pg (ref 26.0–34.0)
MCHC: 31.1 g/dL (ref 30.0–36.0)
MCV: 98 fL (ref 80.0–100.0)
Monocytes Absolute: 0.4 10*3/uL (ref 0.1–1.0)
Monocytes Relative: 10 %
Neutro Abs: 2.5 10*3/uL (ref 1.7–7.7)
Neutrophils Relative %: 56 %
Platelet Count: 174 10*3/uL (ref 150–400)
RBC: 3.94 MIL/uL (ref 3.87–5.11)
RDW: 18.1 % — ABNORMAL HIGH (ref 11.5–15.5)
WBC Count: 4.5 10*3/uL (ref 4.0–10.5)
nRBC: 0 % (ref 0.0–0.2)

## 2024-05-31 LAB — CMP (CANCER CENTER ONLY)
ALT: 12 U/L (ref 0–44)
AST: 25 U/L (ref 15–41)
Albumin: 3.8 g/dL (ref 3.5–5.0)
Alkaline Phosphatase: 77 U/L (ref 38–126)
Anion gap: 10 (ref 5–15)
BUN: 9 mg/dL (ref 6–20)
CO2: 22 mmol/L (ref 22–32)
Calcium: 8.9 mg/dL (ref 8.9–10.3)
Chloride: 105 mmol/L (ref 98–111)
Creatinine: 0.59 mg/dL (ref 0.44–1.00)
GFR, Estimated: >60 mL/min (ref >60)
Glucose, Bld: 107 mg/dL — ABNORMAL HIGH (ref 70–99)
Potassium: 3.5 mmol/L (ref 3.5–5.1)
Sodium: 137 mmol/L (ref 135–145)
Total Bilirubin: 0.6 mg/dL (ref 0.0–1.2)
Total Protein: 7.1 g/dL (ref 6.5–8.1)

## 2024-05-31 LAB — TOTAL PROTEIN, URINE DIPSTICK: Protein, ur: 30 mg/dL — AB

## 2024-05-31 MED ORDER — PALONOSETRON HCL INJECTION 0.25 MG/5ML
0.2500 mg | Freq: Once | INTRAVENOUS | Status: AC
  Administered 2024-05-31: 0.25 mg via INTRAVENOUS
  Filled 2024-05-31: qty 5

## 2024-05-31 MED ORDER — SODIUM CHLORIDE 0.9 % IV SOLN
10.0000 mg/kg | Freq: Once | INTRAVENOUS | Status: AC
  Administered 2024-05-31: 900 mg via INTRAVENOUS
  Filled 2024-05-31: qty 4

## 2024-05-31 MED ORDER — HEPARIN SOD (PORK) LOCK FLUSH 100 UNIT/ML IV SOLN
500.0000 [IU] | Freq: Once | INTRAVENOUS | Status: AC | PRN
  Administered 2024-05-31: 500 [IU]
  Filled 2024-05-31: qty 5

## 2024-05-31 MED ORDER — SODIUM CHLORIDE 0.9% FLUSH
10.0000 mL | INTRAVENOUS | Status: DC | PRN
  Administered 2024-05-31: 10 mL
  Filled 2024-05-31: qty 10

## 2024-05-31 MED ORDER — FAMOTIDINE IN NACL 20-0.9 MG/50ML-% IV SOLN
20.0000 mg | Freq: Once | INTRAVENOUS | Status: AC
  Administered 2024-05-31: 20 mg via INTRAVENOUS
  Filled 2024-05-31: qty 50

## 2024-05-31 MED ORDER — SODIUM CHLORIDE 0.9 % IV SOLN
INTRAVENOUS | Status: DC
  Filled 2024-05-31: qty 250

## 2024-05-31 MED ORDER — SODIUM CHLORIDE 0.9 % IV SOLN: 10.0000 mg/kg | Freq: Once | INTRAVENOUS | Status: DC

## 2024-05-31 MED ORDER — DIPHENHYDRAMINE HCL 50 MG/ML IJ SOLN
50.0000 mg | Freq: Once | INTRAMUSCULAR | Status: AC
  Administered 2024-05-31: 50 mg via INTRAVENOUS
  Filled 2024-05-31: qty 1

## 2024-05-31 MED ORDER — DEXAMETHASONE SODIUM PHOSPHATE 10 MG/ML IJ SOLN
10.0000 mg | Freq: Once | INTRAMUSCULAR | Status: AC
  Administered 2024-05-31: 10 mg via INTRAVENOUS
  Filled 2024-05-31: qty 1

## 2024-05-31 MED ORDER — SODIUM CHLORIDE 0.9 % IV SOLN
60.0000 mg/m2 | Freq: Once | INTRAVENOUS | Status: AC
  Administered 2024-05-31: 126 mg via INTRAVENOUS
  Filled 2024-05-31: qty 21

## 2024-05-31 NOTE — Assessment & Plan Note (Addendum)
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  # CT scan March 22, 2024-Diffuse peritoneal carcinomatosis with moderate abdominal and pelvic free fluid and nodularity demonstrated throughout the peritoneum, omentum, along the hemidiaphragms, and in the pelvis.  Ventral abdominal wall hernia containing small bowel without evidence of obstruction.  No metastatic disease demonstrated in the chest.  Folate alpha IHC testing=5%.  Also discussed with gynecology oncology.  10/03/2023- Left ventricular ejection fraction, by estimation, is 55 to 60%.   # Proceed with paclitaxel  Avastin - Cycle # 1 day-15 [05/31/2024-  Will also dose reduce Taxol  to 60 mg/m]. Labs-CBC/chemistries were reviewed with the patient.  Discussed with patient that unfortunately her disease is terminal.  Options are very limited- see discussion below-stable/cancer markers improving/improvement of the ascites-however tolerating chemotherapy poorly-see below.  We had a long discussion with the patient and family-daughter that the life expectancy is in the order of months even with chemotherapy.  Discussed that options include stopping chemotherapy at this time vs-continuing chemotherapy with dose modification-for the next few cycles/only if tolerating well.  If not hospice is would be recommended.  # Nausea vomiting abdominal cramping/poor appetite/ secondary to chemotherapy underlying disease- on zyprexa -will add Aloxi .  Will also dose reduce Taxol  to 60 mg/m,  # constipation- miralax  prn/ movantik - Stable.   # Hypocalcemia-pending-Vit D levels; Hypokalemia- severe -3.3  -potassium.  Continue  K-Dur twice daily; Continue slow Mag.  stable-   # Ongoing MSK joints/abdominal pain--secondary malignancy malignancy.  On  Fentanyl  patch; and dilaudid  Dr. 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 No new or acute finding.  Currently on xarelto -  stable-     # PN G-2/back pain- Cymbalta - stable-   # Depression/ Anxiety/Insomnia-status post evaluation with February 2025 psychiatry/cerula care.  Continue Cymbalta  [at 60 mg/day]; continue trazadone    300 mg at bedtime; vraylar -/wellbutrin   s/p cerula care. stable-   # Hot flashes: on Clonidine  patch- on Friday weekly; and clonidine  pill 0.1 qhs. stable-   # Constipation: sec to narcotics- sec to narcotics- improved on opioid antagonist- movantik   # ACP: Discussed advance care planning- DNR-   #IV access/Mediport-currently s/p TPA- port flush.  Stable.   # Debility: ok with wheel chair.   Weekly Taxol ; q 2 w-Bev; ca-125 q 4 weeks  PS # DISPOSITION:  # order random urine protein/every 2 weeks- # chemo today-ok without UA # 1 week- / port- labs- cbc/cmp; chemo # 2 week-APP port-labs- cbc/cmp # 3 weeks-MD  port-labs- cbc/cmp; chemo - Dr.B   # 40 minutes face-to-face with the patient discussing the above plan of care; more than 50% of time spent on prognosis/ natural history; counseling and coordination.

## 2024-05-31 NOTE — Progress Notes (Signed)
 05/29/24 pt seen at [facility] for rt carpal tunnel and rt rotator cuff.  Pt states her appetite and pain is getting better since off the chemo. She's not drinking any supplement drinks at this time. The numbness in her hands/feet are about the same.
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
## 2024-05-31 NOTE — Progress Notes (Signed)
 [facility] Clinical Social Work  Initial Assessment   [name] is a 53 y.o. year old female contacted by phone. Clinical Social Work was referred by medical provider for assessment of psychosocial needs.   SDOH (Social Determinants of Health) assessments performed: Yes SDOH Interventions    Flowsheet Row ED to Hosp-Admission (Discharged) from 04/10/2024 in [facility] Most recent reading at 04/12/2024 11:01 AM Office Visit from 08/09/2023 in [facility] Most recent reading at 08/09/2023 11:23 AM Office Visit from 01/04/2023 in [facility] Most recent reading at 01/04/2023 11:52 AM Clinical Support from 12/12/2022 in [facility] Most recent reading at 12/12/2022  4:33 PM Office Visit from 09/28/2022 in [facility] Most recent reading at 09/28/2022 11:37 AM Care Coordination from 09/28/2022 in [facility] Coordination Most recent reading at 09/28/2022 10:47 AM  SDOH Interventions        Food Insecurity Interventions Intervention Not Indicated, Inpatient TOC -- -- -- -- Intervention Not Indicated  [receives food stamps]  Housing Interventions Intervention Not Indicated, Inpatient TOC -- -- [id number] Referral, Other (Comment)  [Patient will need to leave current home by [place] 1st due to landlord raising rent.  Patient does not have a lease.] -- Intervention Not Indicated  Transportation Interventions Intervention Not Indicated, Inpatient TOC -- -- -- -- Intervention Not Indicated  Utilities Interventions Intervention Not Indicated, Inpatient TOC -- -- -- -- Intervention Not Indicated  Alcohol Usage Interventions -- -- -- Intervention Not Indicated (Score <7) -- --  Depression Interventions/Treatment  -- Currently on Treatment Currently on Treatment -- Currently on Treatment --  Physical Activity Interventions -- -- --  Intervention Not Indicated -- --  Stress Interventions -- -- -- -- -- Other (Comment)  [will refer for counseling]  Social Connections Interventions -- -- -- Intervention Not Indicated -- --       SDOH Screenings   Food Insecurity: No Food Insecurity (04/12/2024)  Housing: Low Risk  (04/12/2024)  Transportation Needs: No Transportation Needs (04/12/2024)  Utilities: Not At Risk (04/12/2024)  Alcohol Screen: Low Risk  (08/07/2023)  Depression (PHQ2-9): High Risk (08/09/2023)  Financial Resource Strain: Medium Risk (10/24/2023)   Received from [facility]  Physical Activity: Insufficiently Active (10/24/2023)   Received from [facility]  Social Connections: Somewhat Isolated (10/24/2023)   Received from [facility]  Stress: Stress Concern Present (10/24/2023)   Received from [facility]  Tobacco Use: High Risk (05/31/2024)     Distress Screen completed: No    12/18/2019    2:37 PM  ONCBCN DISTRESS SCREENING  Screening Type Initial Screening  How much distress have you been experiencing in the past week? (0-10) 8  Information Concerns Type Lack of info about treatment      Family/Social Information:  Housing Arrangement: patient lives with her 52 y/o daughter. Family members/support persons in your life? Family.  Patient has two sons and two daughters.  She said the daughter that lives with her is 8 months pregnant. Transportation concerns: no  Employment: Legally disabled  Income Risk analyst concerns: Yes, current concerns Type of concern: Transportation.  Gas money. Food access concerns: yes Religious or spiritual practice: No Advanced directives: Yes-Patient has a MOST form. Services Currently in place:  [facility] and Medicaid.  Coping/ Adjustment to diagnosis: Patient understands treatment plan and what happens next? yes Concerns about diagnosis and/or  treatment: Quality of life Patient reported stressors: Therapist, art and/or  priorities: Family. Patient enjoys time with family/ friends Current coping skills/ strengths: Manufacturing systems engineer , General fund of knowledge , Motivation for treatment/growth , and Supportive family/friends     SUMMARY: Current SDOH Barriers:  Financial constraints related to fixed income.  Clinical Social Work Clinical Goal(s):  Scientist, research (life sciences) options for unmet needs related to:  Financial Strain   Interventions: Discussed common feeling and emotions when being diagnosed with cancer, and the importance of support during treatment Informed patient of the support team roles and support services at [facility] Provided CSW contact information and encouraged patient to call with any questions or concerns Provided patient with information about Cancer Services and emailed referral.  Patient reports that she previously utilized all of her [employer].  CSW to  email her additional grant information.   Follow Up Plan: CSW will follow-up with patient by phone  Patient verbalizes understanding of plan: Yes    Kennth Peal, LCSW Clinical Social Worker [facility]  Patient is participating in a Managed Medicaid Plan:  Yes

## 2024-06-01 ENCOUNTER — Other Ambulatory Visit: Payer: Self-pay | Admitting: Internal Medicine

## 2024-06-03 ENCOUNTER — Encounter: Payer: Self-pay | Admitting: Internal Medicine

## 2024-06-04 ENCOUNTER — Encounter: Payer: Self-pay | Admitting: Internal Medicine

## 2024-06-06 ENCOUNTER — Encounter: Payer: Self-pay | Admitting: Internal Medicine

## 2024-06-06 ENCOUNTER — Other Ambulatory Visit: Payer: Self-pay | Admitting: *Deleted

## 2024-06-06 DIAGNOSIS — C482 Malignant neoplasm of peritoneum, unspecified: Secondary | ICD-10-CM

## 2024-06-07 ENCOUNTER — Inpatient Hospital Stay

## 2024-06-07 ENCOUNTER — Encounter: Payer: Self-pay | Admitting: *Deleted

## 2024-06-07 ENCOUNTER — Inpatient Hospital Stay (HOSPITAL_BASED_OUTPATIENT_CLINIC_OR_DEPARTMENT_OTHER): Admitting: Hospice and Palliative Medicine

## 2024-06-07 ENCOUNTER — Encounter: Payer: Self-pay | Admitting: Internal Medicine

## 2024-06-07 ENCOUNTER — Inpatient Hospital Stay: Admitting: Internal Medicine

## 2024-06-07 ENCOUNTER — Inpatient Hospital Stay (HOSPITAL_BASED_OUTPATIENT_CLINIC_OR_DEPARTMENT_OTHER): Admitting: Internal Medicine

## 2024-06-07 ENCOUNTER — Other Ambulatory Visit: Payer: Self-pay | Admitting: Internal Medicine

## 2024-06-07 VITALS — BP 104/84 | HR 118 | Temp 96.4°F | Resp 16 | Ht 63.0 in | Wt 190.7 lb

## 2024-06-07 VITALS — HR 99

## 2024-06-07 DIAGNOSIS — C482 Malignant neoplasm of peritoneum, unspecified: Secondary | ICD-10-CM

## 2024-06-07 DIAGNOSIS — Z7189 Other specified counseling: Secondary | ICD-10-CM

## 2024-06-07 DIAGNOSIS — Z515 Encounter for palliative care: Secondary | ICD-10-CM

## 2024-06-07 DIAGNOSIS — Z5112 Encounter for antineoplastic immunotherapy: Secondary | ICD-10-CM | POA: Diagnosis not present

## 2024-06-07 LAB — CMP (CANCER CENTER ONLY)
ALT: 11 U/L (ref 0–44)
AST: 27 U/L (ref 15–41)
Albumin: 3.7 g/dL (ref 3.5–5.0)
Alkaline Phosphatase: 81 U/L (ref 38–126)
Anion gap: 13 (ref 5–15)
BUN: 9 mg/dL (ref 6–20)
CO2: 21 mmol/L — ABNORMAL LOW (ref 22–32)
Calcium: 8.5 mg/dL — ABNORMAL LOW (ref 8.9–10.3)
Chloride: 104 mmol/L (ref 98–111)
Creatinine: 0.85 mg/dL (ref 0.44–1.00)
GFR, Estimated: >60 mL/min (ref >60)
Glucose, Bld: 137 mg/dL — ABNORMAL HIGH (ref 70–99)
Potassium: 3.1 mmol/L — ABNORMAL LOW (ref 3.5–5.1)
Sodium: 138 mmol/L (ref 135–145)
Total Bilirubin: 0.4 mg/dL (ref 0.0–1.2)
Total Protein: 7.1 g/dL (ref 6.5–8.1)

## 2024-06-07 LAB — CBC WITH DIFFERENTIAL (CANCER CENTER ONLY)
Abs Immature Granulocytes: 0.04 10*3/uL (ref 0.00–0.07)
Basophils Absolute: 0.1 10*3/uL (ref 0.0–0.1)
Basophils Relative: 1 %
Eosinophils Absolute: 0.2 10*3/uL (ref 0.0–0.5)
Eosinophils Relative: 3 %
HCT: 38.8 % (ref 36.0–46.0)
Hemoglobin: 12.3 g/dL (ref 12.0–15.0)
Immature Granulocytes: 1 %
Lymphocytes Relative: 30 %
Lymphs Abs: 1.6 10*3/uL (ref 0.7–4.0)
MCH: 30.8 pg (ref 26.0–34.0)
MCHC: 31.7 g/dL (ref 30.0–36.0)
MCV: 97.2 fL (ref 80.0–100.0)
Monocytes Absolute: 0.3 10*3/uL (ref 0.1–1.0)
Monocytes Relative: 5 %
Neutro Abs: 3.1 10*3/uL (ref 1.7–7.7)
Neutrophils Relative %: 60 %
Platelet Count: 170 10*3/uL (ref 150–400)
RBC: 3.99 MIL/uL (ref 3.87–5.11)
RDW: 17.8 % — ABNORMAL HIGH (ref 11.5–15.5)
WBC Count: 5.3 10*3/uL (ref 4.0–10.5)
nRBC: 0 % (ref 0.0–0.2)

## 2024-06-07 LAB — TOTAL PROTEIN, URINE DIPSTICK: Protein, ur: NEGATIVE mg/dL

## 2024-06-07 MED ORDER — SODIUM CHLORIDE 0.9 % IV SOLN
INTRAVENOUS | Status: DC
  Filled 2024-06-07: qty 250

## 2024-06-07 MED ORDER — DEXAMETHASONE SODIUM PHOSPHATE 10 MG/ML IJ SOLN
10.0000 mg | Freq: Once | INTRAMUSCULAR | Status: AC
  Administered 2024-06-07: 10 mg via INTRAVENOUS
  Filled 2024-06-07: qty 1

## 2024-06-07 MED ORDER — FAMOTIDINE IN NACL 20-0.9 MG/50ML-% IV SOLN
20.0000 mg | Freq: Once | INTRAVENOUS | Status: AC
  Administered 2024-06-07: 20 mg via INTRAVENOUS
  Filled 2024-06-07: qty 50

## 2024-06-07 MED ORDER — PALONOSETRON HCL INJECTION 0.25 MG/5ML
0.2500 mg | Freq: Once | INTRAVENOUS | Status: AC
  Administered 2024-06-07: 0.25 mg via INTRAVENOUS
  Filled 2024-06-07: qty 5

## 2024-06-07 MED ORDER — SODIUM CHLORIDE 0.9% FLUSH
10.0000 mL | INTRAVENOUS | Status: DC | PRN
  Administered 2024-06-07: 10 mL
  Filled 2024-06-07: qty 10

## 2024-06-07 MED ORDER — HEPARIN SOD (PORK) LOCK FLUSH 100 UNIT/ML IV SOLN
500.0000 [IU] | Freq: Once | INTRAVENOUS | Status: AC | PRN
  Administered 2024-06-07: 500 [IU]
  Filled 2024-06-07: qty 5

## 2024-06-07 MED ORDER — DIPHENHYDRAMINE HCL 50 MG/ML IJ SOLN
50.0000 mg | Freq: Once | INTRAMUSCULAR | Status: AC
  Administered 2024-06-07: 50 mg via INTRAVENOUS
  Filled 2024-06-07: qty 1

## 2024-06-07 MED ORDER — SODIUM CHLORIDE 0.9 % IV SOLN
60.0000 mg/m2 | Freq: Once | INTRAVENOUS | Status: AC
  Administered 2024-06-07: 126 mg via INTRAVENOUS
  Filled 2024-06-07: qty 21

## 2024-06-07 NOTE — Progress Notes (Signed)
 Pt like to speak with [name] about changing her DNR.

## 2024-06-07 NOTE — Progress Notes (Signed)
 I connected with [name] on 06/07/24 at  9:15 AM EDT by video enabled telemedicine visit and verified that I am speaking with the correct person using two identifiers.  I discussed the limitations, risks, security and privacy concerns of performing an evaluation and management service by telemedicine and the availability of in-person appointments. I also discussed with the patient that there may be a patient responsible charge related to this service. The patient expressed understanding and agreed to proceed.    Other persons participating in the visit and their role in the encounter: RN/medical reconciliation Patient's location: office Provider's location: home  Oncology History Overview Note  # DEC 2020- ADENO CA [s/p Pleural effusion]; [name]; [name]; abdominal ascites status post paracentesis x2; adenocarcinoma; [name]; omental caking/peritoneal disease/no obvious evidence of bowel involvement; no adnexal masses readily noted; Ca [phone].   # 12/23/2019- Carbo-Taxol  #1; Jan 18 th 2021- #2 carbo-Taxol -Bev status post 4 cycles-April 08, 2020-debulking surgery [Dr. Secord] miliary disease noted post surgery. Carbo-Taxol -Avastin  x6  # July 6th 2021- Avastin  q 3 W+ OLAPARIB  300 mg BID  # OCT 26th, 2021-recurrent anemia [hemoglobin 7.5]; HELD Olaparib   # DEC 9th 2021- olaparib  to 250 BID; [name], [name]; [name] ; HOLD AVASTIN  [last 2/11]sec to upcoming hernia repair  # June 20th, 2022 ~restart olaparib  200 mg twice daily_+ Avastin .   # Jan 15th 2021- L UE SVTxarelto; March 10th-stop Xarelto  [gum bleeding-platelets 70s/Avastin ]; April 15th 2021-started Xarelto  20 mg post surgery; mid May 2021-Xarelto  10 mg a day/prophylaxis.  # JAN 2024- Brain MRI with and without contrast-nonocclusive dural venous sinus thrombosis  noted-without any brain infarct or any metastatic disease to the brain.  Lovenox / NOAK for long-term needs. HOLD avastin .  # APRIL 26th  2024#1-- Doxil -carboplatin  every 4 weeks x6 cyles- Chemo holiday- started OCT 2024.   # March 28 the CT scan-progressive peritoneal carcinomatosis.  # APRIL 4th, 2025- gemcitabine  single agent cycle #1 day 1 day 8-every 21 days- Disocntinued sec to skin rash/pt relcutance  # April 25th start Taxol -Avatsin-  # BRCA-1 [on screening; s/p genetics counseling; [name]; July 2019- 2-3cm-right complex ovarian cyst- likely benign/hemorrhagic [also 2011].  # #December 2021 screening breast MRI-left breast 9 mm lesion biopsy; apocrine metaplasia/benign; annual MRI.    DIAGNOSIS: Primary peritoneal adenocarcinoma  STAGE:   IV      ;  GOALS: control    Primary peritoneal carcinomatosis (HCC)  12/16/2019 Initial Diagnosis   Primary peritoneal adenocarcinoma (HCC)   12/23/2019 - 07/08/2022 Chemotherapy   Patient is on Treatment Plan : Carboplatin  + Paclitaxel  + Mvasi  q21d     12/23/2019 - 12/20/2022 Chemotherapy   Patient is on Treatment Plan : OVARIAN Carboplatin  + Paclitaxel  + Bevacizumab  q21d      01/07/2021 Cancer Staging   Staging form: Ovary, Fallopian Tube, and Primary Peritoneal Carcinoma, AJCC 8th Edition - Clinical: Stage IVA (pM1a) - Signed by Gwyn Leos, MD on 01/07/2021   04/21/2023 - 10/23/2023 Chemotherapy   Patient is on Treatment Plan : OVARIAN RECURRENT Liposomal Doxorubicin  + Carboplatin  q28d X 6 Cycles     03/28/2024 - 03/28/2024 Chemotherapy   Patient is on Treatment Plan : BREAST Gemcitabine  D1,8 (800) q21d     04/19/2024 -  Chemotherapy   Patient is on Treatment Plan : OVARIAN Paclitaxel   Z6,1,09,60 + Bevacizumab  D1,15 q28d         Chief  Complaint: ovarian cancer on chemo    History of present illness:[name] 53 y.o.  female with history of metastatic ovarian cancer platinum refractory is here for  follow-up.  Patient is currently on Taxol  plus Avastin .  Appetite improving.  No nausea or vomiting since dose reduction.  No fever no chills.  Patient has not needed any paracentesis recently.  Patient also noted no worsening tingling and numbness.  Patient interested in reverting her CODE STATUS from DNR to full code.  Observation/objective: Alert & oriented x 3. In No acute distress.   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  # CT scan March 22, 2024-Diffuse peritoneal carcinomatosis with moderate abdominal and pelvic free fluid and nodularity demonstrated throughout the peritoneum, omentum, along the hemidiaphragms, and in the pelvis.  Ventral abdominal wall hernia containing small bowel without evidence of obstruction.  No metastatic disease demonstrated in the chest.  Folate alpha IHC testing=5%.  Also discussed with gynecology oncology.  10/03/2023- Left ventricular ejection fraction, by estimation, is 55 to 60%.   # Proceed with paclitaxel  Avastin - Cycle # 1 day 22  [05/31/2024-  Will also dose reduce Taxol  to 60 mg/m]. Labs-CBC/chemistries were reviewed with the patient.    # Nausea vomiting abdominal cramping/poor appetite/ secondary to chemotherapy underlying disease- on zyprexa -/Aloxi  dose reduce Taxol  to 60 mg/m-improved/stable,  # constipation- miralax  prn/ movantik - Stable.   # Hypocalcemia-pending-Vit D levels; Hypokalemia- severe -3.3  -potassium.  Continue  K-Dur twice daily; Continue slow Mag. Stable.    # Ongoing MSK joints/abdominal pain--secondary malignancy malignancy.  On  Fentanyl  patch; and dilaudid  Dr. 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 No new or acute finding.   Currently on xarelto -  stable; Okay with extraction however has to hold her Xarelto  4 days before-and then restart 2 days after.     # PN G-2/back pain- Cymbalta - stable-   # Depression/ Anxiety/Insomnia-status post evaluation with February 2025 psychiatry/cerula care.  Continue Cymbalta  [at 60 mg/day]; continue trazadone    300 mg at bedtime; vraylar -/wellbutrin   s/p cerula care. stable-   # Hot flashes: on Clonidine  patch- on Friday weekly; and clonidine  pill 0.1 qhs.stable-   # ACP: Patient is currently DNR-however she wants revert to full code.  She feels that she wants to give herself a fighting chance.  Discussed with [employer].   #IV access/Mediport-currently s/p TPA- port flush.  Stable.   # Debility: ok with wheel chair.   Weekly Taxol ; q 2 w-Bev; ca-125 q 4 weeks  PS # DISPOSITION:  # order random urine protein/every 2 weeks- # chemo today-ok without UA # 1 week-APP  / port- labs- cbc/cmp; chemo # 2 week-port-labs- cbc/cmp # 4 weeks-MD  port-labs- cbc/cmp; chemo; ca 125- - Dr.B   Follow-up instructions:  I discussed the assessment and treatment plan with the patient.  The patient was provided an opportunity to ask questions and all were answered.  The patient agreed with the plan and demonstrated understanding of instructions.  The patient was advised to call back or seek an in person evaluation if the symptoms worsen or if the condition fails to improve as anticipated.    Dr. Hedda Liv [facility] at [facility] 06/07/2024 10:35 AM

## 2024-06-07 NOTE — Assessment & Plan Note (Addendum)
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  # CT scan March 22, 2024-Diffuse peritoneal carcinomatosis with moderate abdominal and pelvic free fluid and nodularity demonstrated throughout the peritoneum, omentum, along the hemidiaphragms, and in the pelvis.  Ventral abdominal wall hernia containing small bowel without evidence of obstruction.  No metastatic disease demonstrated in the chest.  Folate alpha IHC testing=5%.  Also discussed with gynecology oncology.  10/03/2023- Left ventricular ejection fraction, by estimation, is 55 to 60%.   # Proceed with paclitaxel  Avastin - Cycle # 1 day 22  [05/31/2024-  Will also dose reduce Taxol  to 60 mg/m]. Labs-CBC/chemistries were reviewed with the patient.    # Nausea vomiting abdominal cramping/poor appetite/ secondary to chemotherapy underlying disease- on zyprexa -/Aloxi  dose reduce Taxol  to 60 mg/m-improved/stable,  # constipation- miralax  prn/ movantik - Stable.   # Hypocalcemia-pending-Vit D levels; Hypokalemia- severe -3.3  -potassium.  Continue  K-Dur twice daily; Continue slow Mag. Stable.    # Ongoing MSK joints/abdominal pain--secondary malignancy malignancy.  On  Fentanyl  patch; and dilaudid  Dr. 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 No new or acute finding.  Currently on xarelto -  stable; Okay with extraction however has to hold her Xarelto  4 days before-and then restart 2 days after.     # PN G-2/back pain- Cymbalta - stable-   # Depression/ Anxiety/Insomnia-status post evaluation with February 2025 psychiatry/cerula care.  Continue Cymbalta  [at 60 mg/day]; continue trazadone    300 mg at bedtime; vraylar -/wellbutrin   s/p cerula care. stable-   # Hot flashes: on Clonidine  patch- on Friday weekly; and clonidine  pill  0.1 qhs.stable-   # ACP: Patient is currently DNR-however she wants revert to full code.  She feels that she wants to give herself a fighting chance.  Discussed with [employer].   #IV access/Mediport-currently s/p TPA- port flush.  Stable.   # Debility: ok with wheel chair.   Weekly Taxol ; q 2 w-Bev; ca-125 q 4 weeks  PS # DISPOSITION:  # order random urine protein/every 2 weeks- # chemo today-ok without UA # 1 week-APP  / port- labs- cbc/cmp; chemo # 2 week-port-labs- cbc/cmp # 4 weeks-MD  port-labs- cbc/cmp; chemo; ca 125- - Dr.B

## 2024-06-07 NOTE — Progress Notes (Signed)
 Palliative Medicine [facility] at [facility] [phone] [phone]   Name: [name] Date: 06/07/2024 MRN: [id number]  DOB: [birth year]  Patient Care Team: Sowles, Krichna, MD as PCP - General (Family Medicine) Constancia Delton, MD as PCP - Cardiology (Cardiology) Rochell Chroman, RN as Oncology Nurse Navigator Aristides Luckey, Carlene Che, NP as Nurse Practitioner (Hospice and Palliative Medicine) Gwyn Leos, MD as Consulting Physician (Internal Medicine) Nobie Batch, MD as Referring Physician (Obstetrics) Marquita Situ, Magali Schmitz, MD as Consulting Physician (General Surgery) Queenie Brunet, RN as Registered Nurse (Oncology)    REASON FOR CONSULTATION: [name] is a 53 y.o. 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  Patient has also had chronic abdominal pain.  She was referred to palliative care to help address goals and manage ongoing symptoms.   SOCIAL HISTORY:     reports that she has been smoking cigarettes. She has a 30 pack-year smoking history. She has never used smokeless tobacco. She reports that she does not currently use alcohol. She reports that she does not use drugs.  Patient unmarried.  Lives at home with her daughter.  Has a sister who lives in [place].  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   Depression    Drug-induced androgenic alopecia    Dyslipidemia 05/17/2022   Dysrhythmia    Family history of breast cancer    GERD (gastroesophageal reflux disease)    Goals of care, counseling/discussion 12/16/2019   Hypertension    Insomnia    Menorrhagia    Migraines    Moderate episode of recurrent major depressive disorder (HCC) 06/17/2022   Osteoarthritis    back   Ovarian cancer (HCC) 12/10/2019   Personal history of chemotherapy    ovarian cancer   Plantar fasciitis     PAST SURGICAL HISTORY:  Past Surgical History:  Procedure Laterality Date   ABDOMINAL HYSTERECTOMY  03/2020   APPENDECTOMY     LSC but ruptured when they did the surgery   BREAST BIOPSY Left 01/04/2021   MRI BX   CESAREAN SECTION     CYSTOSCOPY N/A 04/08/2020   Procedure: CYSTOSCOPY;  Surgeon: Nobie Batch, MD;  [name];  Service: Gynecology;  [name];   INSERTION OF MESH N/A 04/26/2021   Procedure: INSERTION OF MESH;  Surgeon: Marshall Skeeter, MD;  [name];  Service: General;  [name];   IR THORACENTESIS ASP PLEURAL SPACE W/IMG GUIDE  12/06/2019   IUD REMOVAL N/A 04/08/2020   Procedure: INTRAUTERINE DEVICE (IUD) REMOVAL;  Surgeon: Nobie Batch, MD;  [name];  Service: Gynecology;  [name];   PARACENTESIS     x6   PORTA CATH INSERTION N/A 04/23/2020   Procedure: PORTA CATH INSERTION;  Surgeon: Celso College, MD;  Location: [facility] INVASIVE CV LAB;  Service:  Cardiovascular;  [name];   TUBAL LIGATION     at time of CSxn   VENTRAL HERNIA REPAIR N/A 04/26/2021   Procedure: HERNIA REPAIR VENTRAL ADULT;  Surgeon: Marshall Skeeter, MD;  [name];  Service: General;  [name];  need RNFA for the case   WRIST SURGERY Left 11/21/2016   plates and screws inserted    HEMATOLOGY/ONCOLOGY HISTORY:  Oncology History  Overview Note  # DEC 2020- ADENO CA [s/p Pleural effusion]; [name]; [name]; abdominal ascites status post paracentesis x2; adenocarcinoma; [name]; omental caking/peritoneal disease/no obvious evidence of bowel involvement; no adnexal masses readily noted; Ca [phone].   # 12/23/2019- Carbo-Taxol  #1; Jan 18 th 2021- #2 carbo-Taxol -Bev status post 4 cycles-April 08, 2020-debulking surgery [Dr. Secord] miliary disease noted post surgery. Carbo-Taxol -Avastin  x6  # July 6th 2021- Avastin  q 3 W+ OLAPARIB  300 mg BID  # OCT 26th, 2021-recurrent anemia [hemoglobin 7.5]; HELD Olaparib   # DEC 9th 2021- olaparib  to 250 BID; [name], [name]; [name] ; HOLD AVASTIN  [last 2/11]sec to upcoming hernia repair  # June 20th, 2022 ~restart olaparib  200 mg twice daily_+ Avastin .   # Jan 15th 2021- L UE SVTxarelto; March 10th-stop Xarelto  [gum bleeding-platelets 70s/Avastin ]; April 15th 2021-started Xarelto  20 mg post surgery; mid May 2021-Xarelto  10 mg a day/prophylaxis.  # JAN 2024- Brain MRI with and without contrast-nonocclusive dural venous sinus thrombosis noted-without any brain infarct or any metastatic disease to the brain.  Lovenox / NOAK for long-term needs. HOLD avastin .  # APRIL 26th  2024#1-- Doxil -carboplatin  every 4 weeks x6 cyles- Chemo holiday- started OCT 2024.   # March 28 the CT scan-progressive peritoneal carcinomatosis.  # APRIL 4th, 2025- gemcitabine  single agent cycle #1 day 1 day 8-every 21 days- Disocntinued sec to skin rash/pt relcutance  # April 25th start Taxol -Avatsin-  # BRCA-1 [on screening; s/p genetics counseling; [name]; July 2019- 2-3cm-right complex ovarian cyst- likely benign/hemorrhagic [also 2011].  # #December 2021 screening breast MRI-left breast 9 mm lesion biopsy; apocrine metaplasia/benign;  annual MRI.    DIAGNOSIS: Primary peritoneal adenocarcinoma  STAGE:   IV      ;  GOALS: control    Primary peritoneal carcinomatosis (HCC)  12/16/2019 Initial Diagnosis   Primary peritoneal adenocarcinoma (HCC)   12/23/2019 - 07/08/2022 Chemotherapy   Patient is on Treatment Plan : Carboplatin  + Paclitaxel  + Mvasi  q21d     12/23/2019 - 12/20/2022 Chemotherapy   Patient is on Treatment Plan : OVARIAN Carboplatin  + Paclitaxel  + Bevacizumab  q21d      01/07/2021 Cancer Staging   Staging form: Ovary, Fallopian Tube, and Primary Peritoneal Carcinoma, AJCC 8th Edition - Clinical: Stage IVA (pM1a) - Signed by Gwyn Leos, MD on 01/07/2021   04/21/2023 - 10/23/2023 Chemotherapy   Patient is on Treatment Plan : OVARIAN RECURRENT Liposomal Doxorubicin  + Carboplatin  q28d X 6 Cycles     03/28/2024 - 03/28/2024 Chemotherapy   Patient is on Treatment Plan : BREAST Gemcitabine  D1,8 (800) q21d     04/19/2024 -  Chemotherapy   Patient is on Treatment Plan : OVARIAN Paclitaxel   Z6,1,09,60 + Bevacizumab  D1,15 q28d        ALLERGIES:  is allergic to hydroxyzine  hcl.  MEDICATIONS:  Current Outpatient Medications  Medication Sig Dispense Refill   buPROPion  (WELLBUTRIN  XL) 300 MG 24 hr tablet Take 1 tablet (300 mg total) by mouth daily.  90 tablet 1   cariprazine  (VRAYLAR ) 1.5 MG capsule Take 1 capsule (1.5 mg total) by mouth daily. 90 capsule 1   cloNIDine  (CATAPRES  - DOSED IN MG/24 HR) 0.3 mg/24hr patch Place 1 patch (0.3 mg total) onto the skin once a week. Friday 12 patch 3   cloNIDine  (CATAPRES ) 0.1 MG tablet Take 1 tablet (0.1 mg total) by mouth at bedtime. 90 tablet 1   diphenhydrAMINE  (BENADRYL ) 25 mg capsule Take 25 mg by mouth every 6 (six) hours as needed for allergies or itching.     docusate sodium  (COLACE) 100 MG capsule Take 100 mg by mouth daily as needed for moderate constipation or mild constipation.     DULoxetine  (CYMBALTA ) 60 MG capsule TAKE 1 CAPSULE BY MOUTH DAILY 90 capsule  0   fentaNYL  (DURAGESIC ) 50 MCG/HR Place 1 patch onto the skin every 3 (three) days. 10 patch 0   HYDROmorphone  (DILAUDID ) 4 MG tablet Take 1 tablet (4 mg total) by mouth every 4 (four) hours as needed for severe pain (pain score 7-10). 60 tablet 0   lactobacillus acidophilus (BACID) TABS tablet Take 2 tablets by mouth daily at 12 noon. (Patient not taking: Reported on 06/07/2024)     Multiple Vitamin (MULTIVITAMIN WITH MINERALS) TABS tablet Take 1 tablet by mouth daily.     naloxegol  oxalate (MOVANTIK ) 25 MG TABS tablet Take 1 tablet (25 mg total) by mouth daily. 30 tablet 2   nystatin  (MYCOSTATIN /NYSTOP ) powder Apply 1 Application topically 3 (three) times daily. 60 g 0   OLANZapine  (ZYPREXA ) 10 MG tablet Take 1 tablet (10 mg total) by mouth at bedtime as needed (nausea). 30 tablet 3   omeprazole  (PRILOSEC) 20 MG capsule TAKE 1 CAPSULE BY MOUTH DAILY 90 capsule 0   ondansetron  (ZOFRAN ) 8 MG tablet One pill every 8 hours as needed for nausea/vomitting. 90 tablet 1   polyethylene glycol powder (GLYCOLAX /MIRALAX ) 17 GM/SCOOP powder Take 0.5 Containers by mouth daily as needed for mild constipation or moderate constipation.     potassium chloride  20 MEQ/15ML (10%) SOLN Take 15 mLs (20 mEq total) by mouth 2 (two) times daily. 473 mL 1   pregabalin  (LYRICA ) 150 MG capsule Take 150 mg by mouth 3 (three) times daily as needed (Pain).     rosuvastatin  (CRESTOR ) 5 MG tablet TAKE 1 TABLET BY MOUTH ONCE  DAILY (IN PLACE OF ATORVASTATIN ) 90 tablet 0   solifenacin  (VESICARE ) 10 MG tablet Take 10 mg by mouth daily.     SUMAtriptan  (IMITREX ) 100 MG tablet Take 1 tablet (100 mg total) by mouth every 2 (two) hours as needed for migraine. May repeat in 2 hours if headache persists or recurs. 10 tablet 2   Tiotropium Bromide  Monohydrate (SPIRIVA  RESPIMAT) 2.5 MCG/ACT AERS Inhale 2 puffs into the lungs daily. 12 g 1   traZODone  (DESYREL ) 300 MG tablet Take 1 tablet (300 mg total) by mouth at bedtime as needed for  sleep. 90 tablet 1   XARELTO  20 MG TABS tablet TAKE 1 TABLET BY MOUTH DAILY  WITH SUPPER 90 tablet 3   No current facility-administered medications for this visit.   Facility-Administered Medications Ordered in Other Visits  Medication Dose Route Frequency Provider Last Rate Last Admin   0.9 %  sodium chloride  infusion   Intravenous Continuous Brahmanday, Govinda R, MD 10 mL/hr at 06/07/24 1014 New Bag at 06/07/24 1014   famotidine  (PEPCID ) IVPB 20 mg premix  20 mg Intravenous Once Brahmanday, Govinda R, MD  heparin  lock flush 100 unit/mL  250 Units Intracatheter Once PRN Brahmanday, Govinda R, MD       heparin  lock flush 100 unit/mL  500 Units Intracatheter Once PRN Brahmanday, Govinda R, MD       ondansetron  (ZOFRAN ) 4 MG/2ML injection            PACLitaxel  (TAXOL ) 126 mg in sodium chloride  0.9 % 250 mL chemo infusion (</= 80mg /m2)  60 mg/m2 (Treatment Plan Recorded) Intravenous Once Brahmanday, Govinda R, MD       palonosetron  (ALOXI ) injection 0.25 mg  0.25 mg Intravenous Once Brahmanday, Govinda R, MD       sodium chloride  flush (NS) 0.9 % injection 10 mL  10 mL Intravenous Once Brahmanday, Govinda R, MD       sodium chloride  flush (NS) 0.9 % injection 10 mL  10 mL Intracatheter PRN Gwyn Leos, MD        VITAL SIGNS: LMP 06/19/2017  There were no vitals filed for this visit.  Estimated body mass index is 33.78 kg/m as calculated from the following:   Height as of an earlier encounter on 06/07/24: 5' 3 (1.6 m).   Weight as of an earlier encounter on 06/07/24: 190 lb 11.2 oz (86.5 kg).  LABS: CBC:    Component Value Date/Time   WBC 5.3 06/07/2024 0900   WBC 7.1 04/11/2024 0033   HGB 12.3 06/07/2024 0900   HCT 38.8 06/07/2024 0900   PLT 170 06/07/2024 0900   MCV 97.2 06/07/2024 0900   NEUTROABS 3.1 06/07/2024 0900   LYMPHSABS 1.6 06/07/2024 0900   MONOABS 0.3 06/07/2024 0900   EOSABS 0.2 06/07/2024 0900   BASOSABS 0.1 06/07/2024 0900   Comprehensive  Metabolic Panel:    Component Value Date/Time   NA 138 06/07/2024 0900   K 3.1 (L) 06/07/2024 0900   CL 104 06/07/2024 0900   CO2 21 (L) 06/07/2024 0900   BUN 9 06/07/2024 0900   CREATININE 0.85 06/07/2024 0900   CREATININE 0.79 10/25/2019 0000   GLUCOSE 137 (H) 06/07/2024 0900   CALCIUM  8.5 (L) 06/07/2024 0900   AST 27 06/07/2024 0900   ALT 11 06/07/2024 0900   ALKPHOS 81 06/07/2024 0900   BILITOT 0.4 06/07/2024 0900   PROT 7.1 06/07/2024 0900   ALBUMIN  3.7 06/07/2024 0900    RADIOGRAPHIC STUDIES: US  ASCITES (ABDOMEN LIMITED) Result Date: 05/17/2024 CLINICAL DATA:  53 year old female with stage IV ovarian cancer, peritoneal carcinomatosis, recurrent malignant ascites. Request for therapeutic paracentesis. EXAM: ULTRASOUND ABDOMEN LIMITED COMPARISON:  CT AP, 03/21/2024.  US  paracentesis, 04/18/2024. FINDINGS: Limited ultrasound done of all 4 quadrants. There is only small volume of ascites present. There is no pocket of fluid large enough to allow for safe approach for paracentesis. IMPRESSION: Small volume of intra-abdominal ascites. Paracentesis was NOT performed. Ultrasound performed by: Estella Helling, PA-C Electronically Signed   By: Art Largo M.D.   On: 05/17/2024 14:06    PERFORMANCE STATUS (ECOG) : 1 - Symptomatic but completely ambulatory  Review of Systems Unless otherwise noted, a complete review of systems is negative.  Physical Exam General: NAD Pulmonary: Unlabored. Extremities: no edema, no joint deformities Skin: no rashes Neurological: Weakness but otherwise nonfocal  IMPRESSION: Patient was an add-on to my clinic schedule today to address CODE STATUS.  Patient communicated to oncologist desire to reverse CODE STATUS.  She was previously a DNR/DNI.  Discussed with patient.  She says that after discussion with her children, she would like to reverse her  CODE STATUS.  Patient describes the shock of recent prognosis possibly limiting her life to months.   She says that she would like to do everything possible to extend her life.  Patient did agree to have further conversations in the future regarding CODE STATUS and decision making as her clinical status may change.  Patient verbalized understanding of the probable futility associated with resuscitation in the setting of end-stage cancer.  Symptomatically, patient reports feeling improvement on lower dose of chemotherapy.  She says her appetite and nausea have both improved.  Pain is reportedly stable.  PLAN: -Continue current scope of treatment -Continue fentanyl /hydromorphone  -Daily bowel regimen/Movantik  -Continue olanzapine  10 mg nightly as needed for nausea -Continue as needed ondansetron  -Full code (MOST form voided in Vyn[facility]) -Follow-up telephone visit 2 to 3 weeks  Case and plan discussed with Dr. Valentine Gasmen  Patient expressed understanding and was in agreement with this plan. She also understands that She can call the clinic at any time with any questions, concerns, or complaints.     Time Total: 15 minutes  Visit consisted of counseling and education dealing with the complex and emotionally intense issues of symptom management and palliative care in the setting of serious and potentially life-threatening illness.Greater than 50%  of this time was spent counseling and coordinating care related to the above assessment and plan.  Signed by: Gerilyn Kobus, PhD, NP-C

## 2024-06-08 ENCOUNTER — Other Ambulatory Visit: Payer: Self-pay | Admitting: Hospice and Palliative Medicine

## 2024-06-09 LAB — CA 125: Cancer Antigen (CA) 125: 3791 U/mL — ABNORMAL HIGH (ref 0.0–38.1)

## 2024-06-10 ENCOUNTER — Encounter: Payer: Self-pay | Admitting: Internal Medicine

## 2024-06-12 ENCOUNTER — Other Ambulatory Visit: Payer: Self-pay | Admitting: *Deleted

## 2024-06-12 DIAGNOSIS — C482 Malignant neoplasm of peritoneum, unspecified: Secondary | ICD-10-CM

## 2024-06-14 ENCOUNTER — Other Ambulatory Visit

## 2024-06-14 ENCOUNTER — Inpatient Hospital Stay

## 2024-06-14 ENCOUNTER — Ambulatory Visit: Admitting: Nurse Practitioner

## 2024-06-14 ENCOUNTER — Ambulatory Visit

## 2024-06-14 ENCOUNTER — Telehealth: Payer: Self-pay | Admitting: *Deleted

## 2024-06-14 ENCOUNTER — Inpatient Hospital Stay: Admitting: Nurse Practitioner

## 2024-06-14 NOTE — Telephone Encounter (Signed)
 Left vm on pt's cell and also sent mychart msg to patient. Pt no showed for her chemo tx today with Labs/see lauren and treatment.

## 2024-06-14 NOTE — Telephone Encounter (Signed)
 Patient was seen at [facility] on 06/10/24. She would like to transition care to [facility].

## 2024-06-17 ENCOUNTER — Other Ambulatory Visit: Payer: Self-pay | Admitting: Internal Medicine

## 2024-06-17 ENCOUNTER — Inpatient Hospital Stay (HOSPITAL_BASED_OUTPATIENT_CLINIC_OR_DEPARTMENT_OTHER): Admitting: Hospice and Palliative Medicine

## 2024-06-17 DIAGNOSIS — C482 Malignant neoplasm of peritoneum, unspecified: Secondary | ICD-10-CM

## 2024-06-17 NOTE — Progress Notes (Signed)
 Canceled chemos locally-patient has follow-ups planned at [facility].

## 2024-06-17 NOTE — Progress Notes (Signed)
 VM left. Per EMR, it appears patient is transferring care to [facility].

## 2024-06-21 ENCOUNTER — Inpatient Hospital Stay

## 2024-06-21 ENCOUNTER — Inpatient Hospital Stay: Admitting: Internal Medicine

## 2024-06-26 ENCOUNTER — Other Ambulatory Visit: Payer: Self-pay | Admitting: Internal Medicine

## 2024-07-05 ENCOUNTER — Ambulatory Visit

## 2024-07-05 ENCOUNTER — Ambulatory Visit: Admitting: Internal Medicine

## 2024-07-05 ENCOUNTER — Other Ambulatory Visit

## 2024-07-25 ENCOUNTER — Encounter: Payer: Self-pay | Admitting: Internal Medicine

## 2024-08-16 ENCOUNTER — Encounter: Payer: Self-pay | Admitting: Radiology

## 2024-10-25 ENCOUNTER — Encounter: Payer: Self-pay | Admitting: Internal Medicine

## 2024-10-28 ENCOUNTER — Encounter: Payer: Self-pay | Admitting: Radiology

## 2024-11-21 ENCOUNTER — Other Ambulatory Visit: Payer: Self-pay | Admitting: Family Medicine

## 2024-11-21 DIAGNOSIS — I700 Atherosclerosis of aorta: Secondary | ICD-10-CM

## 2024-11-25 DEATH — deceased
